# Patient Record
Sex: Male | Born: 1950 | Race: Black or African American | Hispanic: No | Marital: Single | Smoking: Former smoker
Health system: Southern US, Community
[De-identification: ages and names within clinical notes are randomized; demographics above are authoritative.]

## PROBLEM LIST (undated history)

## (undated) DIAGNOSIS — Z5112 Encounter for antineoplastic immunotherapy: Secondary | ICD-10-CM

## (undated) DIAGNOSIS — C3490 Malignant neoplasm of unspecified part of unspecified bronchus or lung: Secondary | ICD-10-CM

## (undated) DIAGNOSIS — I10 Essential (primary) hypertension: Secondary | ICD-10-CM

## (undated) DIAGNOSIS — F32A Depression, unspecified: Secondary | ICD-10-CM

## (undated) DIAGNOSIS — R569 Unspecified convulsions: Secondary | ICD-10-CM

## (undated) DIAGNOSIS — F79 Unspecified intellectual disabilities: Secondary | ICD-10-CM

## (undated) DIAGNOSIS — F89 Unspecified disorder of psychological development: Secondary | ICD-10-CM

## (undated) DIAGNOSIS — K219 Gastro-esophageal reflux disease without esophagitis: Secondary | ICD-10-CM

## (undated) DIAGNOSIS — Z5111 Encounter for antineoplastic chemotherapy: Secondary | ICD-10-CM

## (undated) DIAGNOSIS — F209 Schizophrenia, unspecified: Secondary | ICD-10-CM

## (undated) DIAGNOSIS — F329 Major depressive disorder, single episode, unspecified: Secondary | ICD-10-CM

## (undated) DIAGNOSIS — J189 Pneumonia, unspecified organism: Secondary | ICD-10-CM

## (undated) DIAGNOSIS — Z923 Personal history of irradiation: Secondary | ICD-10-CM

## (undated) HISTORY — PX: EYE SURGERY: SHX253

## (undated) HISTORY — DX: Encounter for antineoplastic chemotherapy: Z51.11

## (undated) HISTORY — DX: Encounter for antineoplastic immunotherapy: Z51.12

---

## 1998-06-07 ENCOUNTER — Ambulatory Visit (HOSPITAL_BASED_OUTPATIENT_CLINIC_OR_DEPARTMENT_OTHER): Admission: RE | Admit: 1998-06-07 | Discharge: 1998-06-07 | Payer: Self-pay | Admitting: Oral Surgery

## 1998-08-03 ENCOUNTER — Encounter: Payer: Self-pay | Admitting: Urology

## 1998-08-03 ENCOUNTER — Ambulatory Visit (HOSPITAL_COMMUNITY): Admission: RE | Admit: 1998-08-03 | Discharge: 1998-08-03 | Payer: Self-pay | Admitting: Urology

## 1998-08-17 ENCOUNTER — Encounter: Payer: Self-pay | Admitting: Urology

## 1998-08-17 ENCOUNTER — Ambulatory Visit (HOSPITAL_COMMUNITY): Admission: RE | Admit: 1998-08-17 | Discharge: 1998-08-17 | Payer: Self-pay | Admitting: Urology

## 1998-10-06 ENCOUNTER — Emergency Department (HOSPITAL_COMMUNITY): Admission: EM | Admit: 1998-10-06 | Discharge: 1998-10-06 | Payer: Self-pay | Admitting: Emergency Medicine

## 1998-10-06 ENCOUNTER — Encounter: Payer: Self-pay | Admitting: Emergency Medicine

## 1999-07-21 ENCOUNTER — Encounter: Payer: Self-pay | Admitting: Internal Medicine

## 1999-07-21 ENCOUNTER — Ambulatory Visit (HOSPITAL_COMMUNITY): Admission: RE | Admit: 1999-07-21 | Discharge: 1999-07-21 | Payer: Self-pay | Admitting: Internal Medicine

## 1999-10-10 ENCOUNTER — Emergency Department (HOSPITAL_COMMUNITY): Admission: EM | Admit: 1999-10-10 | Discharge: 1999-10-10 | Payer: Self-pay | Admitting: Emergency Medicine

## 1999-10-11 ENCOUNTER — Encounter: Payer: Self-pay | Admitting: Emergency Medicine

## 2002-05-26 ENCOUNTER — Emergency Department (HOSPITAL_COMMUNITY): Admission: EM | Admit: 2002-05-26 | Discharge: 2002-05-26 | Payer: Self-pay

## 2004-06-14 ENCOUNTER — Ambulatory Visit (HOSPITAL_COMMUNITY): Admission: RE | Admit: 2004-06-14 | Discharge: 2004-06-14 | Payer: Self-pay | Admitting: Gastroenterology

## 2005-06-20 ENCOUNTER — Ambulatory Visit (HOSPITAL_COMMUNITY): Admission: RE | Admit: 2005-06-20 | Discharge: 2005-06-20 | Payer: Self-pay | Admitting: Gastroenterology

## 2011-02-04 ENCOUNTER — Emergency Department (HOSPITAL_COMMUNITY): Payer: Medicaid Other

## 2011-02-04 ENCOUNTER — Emergency Department (HOSPITAL_COMMUNITY)
Admission: EM | Admit: 2011-02-04 | Discharge: 2011-02-04 | Disposition: A | Discharge status: Home / Self Care | Payer: Medicaid Other | Attending: Emergency Medicine | Admitting: Emergency Medicine

## 2011-02-04 DIAGNOSIS — F209 Schizophrenia, unspecified: Secondary | ICD-10-CM | POA: Insufficient documentation

## 2011-02-04 DIAGNOSIS — F79 Unspecified intellectual disabilities: Secondary | ICD-10-CM | POA: Insufficient documentation

## 2011-02-04 DIAGNOSIS — M545 Low back pain, unspecified: Secondary | ICD-10-CM | POA: Insufficient documentation

## 2011-02-04 DIAGNOSIS — K219 Gastro-esophageal reflux disease without esophagitis: Secondary | ICD-10-CM | POA: Insufficient documentation

## 2011-02-04 LAB — COMPREHENSIVE METABOLIC PANEL
Albumin: 3.9 g/dL (ref 3.5–5.2)
Alkaline Phosphatase: 86 U/L (ref 39–117)
BUN: 10 mg/dL (ref 6–23)
Creatinine, Ser: 0.9 mg/dL (ref 0.4–1.5)
Glucose, Bld: 98 mg/dL (ref 70–99)
Total Bilirubin: 0.1 mg/dL — ABNORMAL LOW (ref 0.3–1.2)
Total Protein: 7.1 g/dL (ref 6.0–8.3)

## 2011-02-04 LAB — DIFFERENTIAL
Eosinophils Absolute: 0 10*3/uL (ref 0.0–0.7)
Eosinophils Relative: 0 % (ref 0–5)
Lymphocytes Relative: 19 % (ref 12–46)
Lymphs Abs: 2 10*3/uL (ref 0.7–4.0)
Monocytes Absolute: 0.7 10*3/uL (ref 0.1–1.0)
Monocytes Relative: 7 % (ref 3–12)

## 2011-02-04 LAB — URINALYSIS, ROUTINE W REFLEX MICROSCOPIC
Glucose, UA: NEGATIVE mg/dL
Ketones, ur: NEGATIVE mg/dL
Nitrite: NEGATIVE
Protein, ur: NEGATIVE mg/dL

## 2011-02-04 LAB — CBC
HCT: 42.2 % (ref 39.0–52.0)
MCH: 30.3 pg (ref 26.0–34.0)
MCV: 88.8 fL (ref 78.0–100.0)
Platelets: 161 10*3/uL (ref 150–400)
RDW: 13.8 % (ref 11.5–15.5)

## 2011-02-04 LAB — LIPASE, BLOOD: Lipase: 11 U/L (ref 11–59)

## 2011-02-05 LAB — URINE CULTURE: Colony Count: NO GROWTH

## 2011-07-10 ENCOUNTER — Encounter (HOSPITAL_COMMUNITY): Payer: Self-pay | Admitting: Pharmacy Technician

## 2011-07-10 ENCOUNTER — Encounter (HOSPITAL_COMMUNITY)
Admission: RE | Admit: 2011-07-10 | Discharge: 2011-07-10 | Disposition: A | Discharge status: Home / Self Care | Payer: Medicaid Other | Source: Ambulatory Visit | Attending: Ophthalmology | Admitting: Ophthalmology

## 2011-07-10 ENCOUNTER — Encounter (HOSPITAL_COMMUNITY): Payer: Self-pay

## 2011-07-10 HISTORY — DX: Depression, unspecified: F32.A

## 2011-07-10 HISTORY — DX: Gastro-esophageal reflux disease without esophagitis: K21.9

## 2011-07-10 HISTORY — DX: Major depressive disorder, single episode, unspecified: F32.9

## 2011-07-10 HISTORY — DX: Essential (primary) hypertension: I10

## 2011-07-10 LAB — CBC
HCT: 43.5 % (ref 39.0–52.0)
Hemoglobin: 14.8 g/dL (ref 13.0–17.0)
MCH: 30.7 pg (ref 26.0–34.0)
MCV: 90.2 fL (ref 78.0–100.0)
RBC: 4.82 MIL/uL (ref 4.22–5.81)
WBC: 9.5 10*3/uL (ref 4.0–10.5)

## 2011-07-10 NOTE — Pre-Procedure Instructions (Signed)
20 Marco [name]  07/10/2011   Your procedure is scheduled on: Wednesday, November 7  Report to [facility] at 1030 AM.   Call this number if you have problems the morning of surgery: [phone]   Remember:   Do not eat food:After Midnight.  Do not drink clear liquids: 4 Hours before arrival.  Take these medicines the morning of surgery with A SIP OF WATER:none    Do not wear jewelry, make-up or nail polish.  Do not wear lotions, powders, or perfumes. You may wear deodorant.  Do not shave 48 hours prior to surgery.  Do not bring valuables to the hospital.  Contacts, dentures or bridgework may not be worn into surgery.  Leave suitcase in the car. After surgery it may be brought to your room.  For patients admitted to the hospital, checkout time is 11:00 AM the day of discharge.   Patients discharged the day of surgery will not be allowed to drive home.  Name and phone number of your driver:Marco [name]  Special Instructions: CHG Shower Use Special Wash: 1/2 bottle night before surgery and 1/2 bottle morning of surgery.   Please read over the following fact sheets that you were given: Pain Booklet, Coughing and Deep Breathing and Surgical Site Infection Prevention

## 2011-07-11 MED ORDER — PREDNISOLONE ACETATE 1 % OP SUSP
1.0000 [drp] | Freq: Once | OPHTHALMIC | Status: AC
  Administered 2011-07-12 (×2): 1 [drp] via OPHTHALMIC
  Filled 2011-07-11: qty 5

## 2011-07-11 MED ORDER — TETRACAINE HCL 0.5 % OP SOLN
2.0000 [drp] | OPHTHALMIC | Status: AC
  Administered 2011-07-12: 2 [drp] via OPHTHALMIC

## 2011-07-11 MED ORDER — SCOPOLAMINE HBR 0.25 % OP SOLN: 1.0000 [drp] | OPHTHALMIC | Status: AC

## 2011-07-11 MED ORDER — PHENYLEPHRINE HCL 2.5 % OP SOLN
1.0000 [drp] | OPHTHALMIC | Status: AC
  Administered 2011-07-12 (×3): 1 [drp] via OPHTHALMIC

## 2011-07-11 MED ORDER — GATIFLOXACIN 0.5 % OP SOLN
1.0000 [drp] | OPHTHALMIC | Status: DC
  Administered 2011-07-12 (×3): 1 [drp] via OPHTHALMIC
  Filled 2011-07-11: qty 2.5

## 2011-07-11 NOTE — H&P (Signed)
  Pre-operative History and Physical for Ophthalmic Surgery  Marco [name] 06/26/2011   Chief Complaint:decreased vision Diagnosis: .Mature Nuclear Sclerotic Cataract   No Known Allergies  [ X ] None    Planned procedure: Phacoemulsification and PC Intraocular Lens  There were no vitals filed for this visit.  Pulse: 70        Temp: NE        Resp:  18       ROS: non-contributory  Past Medical History  Diagnosis Date  . Mental disorder     sczizophrenia;moderate retardation  . Hypertension     no medications  . GERD (gastroesophageal reflux disease)   . Depression   . DEMENTIA     No past surgical history on file.    History   Social History  . Marital Status: Single    Spouse Name: N/A    Number of Children: N/A  . Years of Education: N/A   Occupational History  . Not on file.   Social History Main Topics  . Smoking status: Current Everyday Smoker -- 0.5 packs/day for 35 years    Types: Cigarettes  . Smokeless tobacco: Not on file  . Alcohol Use: No  . Drug Use: No  . Sexually Active: No   Other Topics Concern  . Not on file   Social History Narrative  . No narrative on file    The following examination is for anesthesia clearance for minimally invasive Ophthalmic surgery. It is primarily to document heart and lung findings and is not intended to elucidate unknown general medical conditions inclusive of abdominal masses, lung lesions, etc.   General Constitution:  good Alertness/Orientation:  Person, time place      [  ] yes     [  ] no  HEENT: [  ]  Eye Findings:  Nuclear Sclerotic Cataract                 left eye  Neck: supple without masses   Chest/Lungs: clear to auscultation   Cardiac: Normal S1 and S2 without Murmur, S3 or S4   Neuro: nf  Other pertinent findings: none  Impression: Nuclear Sclerotic Cataract Left Eye   Planned Procedure:  Phacoemulsification and PC Intraocular Lens     Marco [name]

## 2011-07-12 ENCOUNTER — Encounter (HOSPITAL_COMMUNITY): Payer: Self-pay | Admitting: Anesthesiology

## 2011-07-12 ENCOUNTER — Encounter (HOSPITAL_COMMUNITY): Payer: Self-pay | Admitting: *Deleted

## 2011-07-12 ENCOUNTER — Ambulatory Visit (HOSPITAL_COMMUNITY)
Admission: RE | Admit: 2011-07-12 | Discharge: 2011-07-12 | Disposition: A | Discharge status: Home / Self Care | Payer: Medicaid Other | Source: Ambulatory Visit | Attending: Ophthalmology | Admitting: Ophthalmology

## 2011-07-12 ENCOUNTER — Ambulatory Visit (HOSPITAL_COMMUNITY): Payer: Medicaid Other | Admitting: Anesthesiology

## 2011-07-12 ENCOUNTER — Encounter (HOSPITAL_COMMUNITY)
Admission: RE | Disposition: A | Discharge status: Home / Self Care | Payer: Self-pay | Source: Ambulatory Visit | Attending: Ophthalmology

## 2011-07-12 DIAGNOSIS — K219 Gastro-esophageal reflux disease without esophagitis: Secondary | ICD-10-CM | POA: Insufficient documentation

## 2011-07-12 DIAGNOSIS — F0390 Unspecified dementia without behavioral disturbance: Secondary | ICD-10-CM | POA: Insufficient documentation

## 2011-07-12 DIAGNOSIS — F329 Major depressive disorder, single episode, unspecified: Secondary | ICD-10-CM | POA: Insufficient documentation

## 2011-07-12 DIAGNOSIS — F71 Moderate intellectual disabilities: Secondary | ICD-10-CM | POA: Insufficient documentation

## 2011-07-12 DIAGNOSIS — F209 Schizophrenia, unspecified: Secondary | ICD-10-CM | POA: Insufficient documentation

## 2011-07-12 DIAGNOSIS — I10 Essential (primary) hypertension: Secondary | ICD-10-CM | POA: Insufficient documentation

## 2011-07-12 DIAGNOSIS — Z01812 Encounter for preprocedural laboratory examination: Secondary | ICD-10-CM | POA: Insufficient documentation

## 2011-07-12 DIAGNOSIS — H2510 Age-related nuclear cataract, unspecified eye: Secondary | ICD-10-CM | POA: Insufficient documentation

## 2011-07-12 DIAGNOSIS — F3289 Other specified depressive episodes: Secondary | ICD-10-CM | POA: Insufficient documentation

## 2011-07-12 HISTORY — PX: CATARACT EXTRACTION W/PHACO: SHX586

## 2011-07-12 SURGERY — PHACOEMULSIFICATION, CATARACT, WITH IOL INSERTION
Anesthesia: General | Site: Eye | Laterality: Left

## 2011-07-12 MED ORDER — ONDANSETRON HCL 4 MG/2ML IJ SOLN
INTRAMUSCULAR | Status: DC | PRN
  Administered 2011-07-12: 4 mg via INTRAVENOUS

## 2011-07-12 MED ORDER — HYDROXYPROPYL METHYLCELLULOSE 2.5 % OP SOLN
OPHTHALMIC | Status: DC | PRN
  Administered 2011-07-12: 1 [drp] via OPHTHALMIC

## 2011-07-12 MED ORDER — CEFAZOLIN SUBCONJUNCTIVAL INJECTION 100 MG/0.5 ML
100.0000 mg | INJECTION | SUBCONJUNCTIVAL | Status: AC
  Administered 2011-07-12: 100 mg via SUBCONJUNCTIVAL
  Filled 2011-07-12: qty 0.5

## 2011-07-12 MED ORDER — TRYPAN BLUE 0.15 % OP SOLN
Freq: Once | OPHTHALMIC | Status: AC
  Administered 2011-07-12: 0.5 mL via INTRAOCULAR
  Filled 2011-07-12: qty 0.5

## 2011-07-12 MED ORDER — PROVISC 10 MG/ML IO SOLN
INTRAOCULAR | Status: DC | PRN
  Administered 2011-07-12: .85 mL via INTRAOCULAR

## 2011-07-12 MED ORDER — NA CHONDROIT SULF-NA HYALURON 40-30 MG/ML IO SOLN
INTRAOCULAR | Status: DC | PRN
  Administered 2011-07-12: 0.5 mL via INTRAOCULAR

## 2011-07-12 MED ORDER — DEXAMETHASONE SODIUM PHOSPHATE 10 MG/ML IJ SOLN
INTRAMUSCULAR | Status: DC | PRN
  Administered 2011-07-12: 10 mg

## 2011-07-12 MED ORDER — SODIUM CHLORIDE 0.9 % IV SOLN
INTRAVENOUS | Status: DC | PRN
  Administered 2011-07-12 (×2): via INTRAVENOUS

## 2011-07-12 MED ORDER — TETRACAINE HCL 0.5 % OP SOLN
OPHTHALMIC | Status: AC
  Filled 2011-07-12: qty 2

## 2011-07-12 MED ORDER — PHENYLEPHRINE HCL 2.5 % OP SOLN
OPHTHALMIC | Status: AC
  Filled 2011-07-12: qty 3

## 2011-07-12 MED ORDER — EPINEPHRINE HCL 1 MG/ML IJ SOLN
INTRAMUSCULAR | Status: DC | PRN
  Administered 2011-07-12: .3 mL

## 2011-07-12 MED ORDER — PROPOFOL 10 MG/ML IV EMUL
INTRAVENOUS | Status: DC | PRN
  Administered 2011-07-12: 180 mg via INTRAVENOUS
  Administered 2011-07-12: 50 mg via INTRAVENOUS

## 2011-07-12 MED ORDER — HOMATROPINE HBR 2 % OP SOLN
1.0000 [drp] | Freq: Once | OPHTHALMIC | Status: DC
  Filled 2011-07-12: qty 5

## 2011-07-12 MED ORDER — BACITRACIN-POLYMYXIN B 500-10000 UNIT/GM OP OINT
TOPICAL_OINTMENT | OPHTHALMIC | Status: DC | PRN
  Administered 2011-07-12: 1 via OPHTHALMIC

## 2011-07-12 MED ORDER — FENTANYL CITRATE 0.05 MG/ML IJ SOLN
INTRAMUSCULAR | Status: DC | PRN
  Administered 2011-07-12 (×2): 50 ug via INTRAVENOUS

## 2011-07-12 MED ORDER — MIDAZOLAM HCL 5 MG/5ML IJ SOLN
INTRAMUSCULAR | Status: DC | PRN
  Administered 2011-07-12: 1 mg via INTRAVENOUS

## 2011-07-12 MED ORDER — SODIUM CHLORIDE 0.9 % IV SOLN
INTRAVENOUS | Status: DC
  Administered 2011-07-12: 14:00:00 via INTRAVENOUS

## 2011-07-12 MED ORDER — BALANCED SALT IO SOLN
INTRAOCULAR | Status: DC | PRN
  Administered 2011-07-12: 15 mL via INTRAOCULAR

## 2011-07-12 MED ORDER — EPINEPHRINE HCL 1 MG/ML IJ SOLN
INTRAOCULAR | Status: DC | PRN
  Administered 2011-07-12: 15:00:00

## 2011-07-12 MED ORDER — BUPIVACAINE HCL 0.75 % IJ SOLN
INTRAMUSCULAR | Status: DC | PRN
  Administered 2011-07-12: 10 mL

## 2011-07-12 SURGICAL SUPPLY — 56 items
APL SRG 3 HI ABS STRL LF PLS (MISCELLANEOUS) ×1
APPLICATOR COTTON TIP 6IN STRL (MISCELLANEOUS) ×2 IMPLANT
APPLICATOR DR MATTHEWS STRL (MISCELLANEOUS) ×2 IMPLANT
BLADE EYE MINI 60D BEAVER (BLADE) IMPLANT
BLADE KERATOME 2.75 (BLADE) ×2 IMPLANT
BLADE STAB KNIFE 15DEG (BLADE) IMPLANT
CANNULA ANTERIOR CHAMBER 27GA (MISCELLANEOUS) IMPLANT
CLOTH BEACON ORANGE TIMEOUT ST (SAFETY) ×2 IMPLANT
DRAPE OPHTHALMIC 77X100 STRL (CUSTOM PROCEDURE TRAY) ×2 IMPLANT
DRAPE POUCH INSTRU U-SHP 10X18 (DRAPES) ×2 IMPLANT
DRSG TEGADERM 4X4.75 (GAUZE/BANDAGES/DRESSINGS) ×2 IMPLANT
EYE SHIELD UNIVERSAL CLEAR (GAUZE/BANDAGES/DRESSINGS) ×1 IMPLANT
FILTER BLUE MILLIPORE (MISCELLANEOUS) IMPLANT
GLOVE BIOGEL M 6.5 STRL (GLOVE) ×4 IMPLANT
GLOVE SS BIOGEL STRL SZ 6.5 (GLOVE) ×1 IMPLANT
GLOVE SUPERSENSE BIOGEL SZ 6.5 (GLOVE) ×1
GOWN PREVENTION PLUS XLARGE (GOWN DISPOSABLE) ×2 IMPLANT
GOWN STRL NON-REIN LRG LVL3 (GOWN DISPOSABLE) ×2 IMPLANT
KIT ROOM TURNOVER OR (KITS) IMPLANT
KNIFE GRIESHABER SHARP 2.5MM (MISCELLANEOUS) ×2 IMPLANT
LENS IOL ACRYSOF MP POST 21.0 (Intraocular Lens) ×1 IMPLANT
MARKER SKIN DUAL TIP RULER LAB (MISCELLANEOUS) ×2 IMPLANT
NDL 18GX1X1/2 (RX/OR ONLY) (NEEDLE) IMPLANT
NDL 25GX 5/8IN NON SAFETY (NEEDLE) ×1 IMPLANT
NDL HYPO 30X.5 LL (NEEDLE) ×2 IMPLANT
NEEDLE 18GX1X1/2 (RX/OR ONLY) (NEEDLE) IMPLANT
NEEDLE 22X1 1/2 (OR ONLY) (NEEDLE) ×2 IMPLANT
NEEDLE 25GX 5/8IN NON SAFETY (NEEDLE) ×2 IMPLANT
NEEDLE HYPO 30X.5 LL (NEEDLE) ×4 IMPLANT
NS IRRIG 1000ML POUR BTL (IV SOLUTION) ×2 IMPLANT
PACK CATARACT CUSTOM (CUSTOM PROCEDURE TRAY) ×2 IMPLANT
PACK CATARACT MCHSCP (PACKS) ×2 IMPLANT
PACK COMBINED CATERACT/VIT 23G (OPHTHALMIC RELATED) IMPLANT
PAD ARMBOARD 7.5X6 YLW CONV (MISCELLANEOUS) ×2 IMPLANT
PAD EYE OVAL STERILE LF (GAUZE/BANDAGES/DRESSINGS) ×2 IMPLANT
PHACO TIP KELMAN 45DEG (TIP) ×2 IMPLANT
PROBE ANTERIOR 20G W/INFUS NDL (MISCELLANEOUS) IMPLANT
ROLLS DENTAL (MISCELLANEOUS) ×4 IMPLANT
SHUTTLE MONARCH TYPE A (NEEDLE) ×2 IMPLANT
SOLUTION ANTI FOG 6CC (MISCELLANEOUS) ×2 IMPLANT
SPEAR EYE SURG WECK-CEL (MISCELLANEOUS) ×6 IMPLANT
SUT ETHILON 10 0 CS140 6 (SUTURE) ×1 IMPLANT
SUT ETHILON 5 0 P 3 18 (SUTURE)
SUT ETHILON 9 0 TG140 8 (SUTURE) IMPLANT
SUT NYLON ETHILON 5-0 P-3 1X18 (SUTURE) IMPLANT
SUT PLAIN 6 0 TG1408 (SUTURE) IMPLANT
SUT POLY NON ABSORB 10-0 8 STR (SUTURE) IMPLANT
SUT VICRYL 6 0 S 29 12 (SUTURE) IMPLANT
SYR 20CC LL (SYRINGE) IMPLANT
SYR 50ML LL SCALE MARK (SYRINGE) IMPLANT
SYR 5ML LL (SYRINGE) IMPLANT
SYR TB 1ML LUER SLIP (SYRINGE) IMPLANT
SYRINGE 10CC LL (SYRINGE) IMPLANT
TAPE CLOTH 2X10 TAN LF (GAUZE/BANDAGES/DRESSINGS) ×1 IMPLANT
TOWEL OR 17X24 6PK STRL BLUE (TOWEL DISPOSABLE) ×4 IMPLANT
WATER STERILE IRR 1000ML POUR (IV SOLUTION) ×2 IMPLANT

## 2011-07-12 NOTE — Brief Op Note (Signed)
07/12/2011  3:42 PM  PATIENT:  Marco [name]  60 y.o. male  PRE-OPERATIVE DIAGNOSIS:  Cataract Left Eye  POST-OPERATIVE DIAGNOSIS:  Cataract Left Eye  PROCEDURE:  Procedure(s): CATARACT EXTRACTION PHACO AND INTRAOCULAR LENS PLACEMENT (IOC)  SURGEON:  Surgeon(s): Shade Flood   ASSISTANTS: none   ANESTHESIA:   general  EBL:  Total I/O In: 500 [I.V.:500] Out: -   BLOOD ADMINISTERED:none  DRAINS: none   LOCAL MEDICATIONS USED:  BUPIVICAINE 0.75% 4CC  SPECIMEN:  No Specimen  DISPOSITION OF SPECIMEN:  N/A  COUNTS:  YES  TOURNIQUET:  * No tourniquets in log *  DICTATION: .Note written in EPIC  PLAN OF CARE: Discharge to home after PACU  PATIENT DISPOSITION:  PACU - hemodynamically stable.   Delay start of Pharmacological VTE agent (>24hrs) due to surgical blood loss or risk of bleeding:  not applicable

## 2011-07-12 NOTE — Transfer of Care (Signed)
Immediate Anesthesia Transfer of Care Note  Patient: Marco [name]  Procedure(s) Performed:  CATARACT EXTRACTION PHACO AND INTRAOCULAR LENS PLACEMENT (IOC)  Patient Location: PACU  Anesthesia Type: General  Level of Consciousness: awake, alert , patient cooperative and responds to stimulation  Airway & Oxygen Therapy: Patient Spontanous Breathing and Patient connected to nasal cannula oxygen  Post-op Assessment: Report given to PACU RN  Post vital signs: Reviewed and stable Complications: No apparent anesthesia complications

## 2011-07-12 NOTE — Anesthesia Procedure Notes (Signed)
Procedure Name: LMA Insertion Date/Time: 07/12/2011 2:56 PM Performed by: Darcey Nora Pre-anesthesia Checklist: Patient identified, Emergency Drugs available, Suction available and Patient being monitored Patient Re-evaluated:Patient Re-evaluated prior to inductionOxygen Delivery Method: Circle System Utilized Preoxygenation: Pre-oxygenation with 100% oxygen Intubation Type: IV induction Ventilation: Mask ventilation without difficulty LMA: LMA inserted LMA Size: 4.0 Number of attempts: 1 Placement Confirmation: positive ETCO2 and breath sounds checked- equal and bilateral Tube secured with: Tape

## 2011-07-12 NOTE — Op Note (Signed)
Marco [name] 07/12/2011 @DIAG @  Procedure: Phacoemulsification, Posterior Chamber Intra-ocular Lens Operative Eye:  left eye  Surgeon: Shade Flood Estimated Blood Loss: minimal Specimens for Pathology:  None Complications: none  The patient was prepared and draped in the usual manner for ocular surgery on the left eye. A Cook lid speculum was placed. A peripheral clear corneal incision was made at the surgical limbus centered at the 11:00 meridian. A separate clear corneal stab incision was made with a 15 degree blade at the 2:00 meridian to permit bi-manual technique. Provisc was instilled into the anterior chamber through that incision.  A keratome was used to create a self sealing incision entering the anterior chamber at the 11:00 meridian.  Membrane Blue in 50%  Concentration was used to stain the anterior capsule of this Hydropic Cataract. Provisc was instilled into the anterior chamber. A capsulorhexis was performed using a bent 25g needle. The lens was hydrodissected and the nucleus was hydrodilineated using a Nichammin cannula. The Chang chopper was inserted and used to rotate the lens to insure adequate lens mobility. The phacoemulsification handpiece was inserted and a combined phaco-chop technique was employed, fracturing the lens into separate sections with subsequent removal with the phaco handpiece.   The I/A cannula was used to remove remaining lens cortex. Provisc was instilled and used to deepen the anterior chamber and posterior capsule bag. The Monarch injector was used to place a folded Acrysof [id number] PC IOL, + 21 diopters, into the capsule bag. A Sinskey lens hook was used to dial in the trailing haptic.  The I/A cannula was used to remove the viscoelastic from the anterior chamber. BSS was used to bring IOP to the desired range and the wound was checked to insure it was watertight. Subconjunctival injections of Ance 100/0.67ml and Dexamethasone 4mg /38ml were placed without  complication. The lid speculum and drapes were removed and the patient's eye was patched with Polymixin/Bacitracin ophthalmic ointment. An eye shield was placed and the patient was transferred alert and conversant from the operating room to the post-operative recovery area.   Dayvian Blixt, Waynette Buttery

## 2011-07-12 NOTE — Brief Op Note (Signed)
07/12/2011  3:45 PM  PATIENT:  Marco [name]  60 y.o. male  PRE-OPERATIVE DIAGNOSIS:  Cataract Left Eye  POST-OPERATIVE DIAGNOSIS:  Cataract Left Eye  PROCEDURE:  Procedure(s): CATARACT EXTRACTION PHACO AND INTRAOCULAR LENS PLACEMENT (IOC)  SURGEON:  Surgeon(s): Shade Flood  PHYSICIAN ASSISTANT:   ASSISTANTS: none   ANESTHESIA:   general  EBL:  Total I/O In: 500 [I.V.:500] Out: -   BLOOD ADMINISTERED:none  DRAINS: none   LOCAL MEDICATIONS USED:  MARCAINE 0.75% 4CC,  SPECIMEN:  No Specimen  DISPOSITION OF SPECIMEN:  N/A  COUNTS:  YES  TOURNIQUET:  * No tourniquets in log *  DICTATION: .Note written in EPIC  PLAN OF CARE: Discharge to home after PACU  PATIENT DISPOSITION:  PACU - hemodynamically stable.   Delay start of Pharmacological VTE agent (>24hrs) due to surgical blood loss or risk of bleeding:  not applicable              GREGORIO WORLEY 07/12/2011 @DIAG @  Procedure: Phacoemulsification, Posterior Chamber Intra-ocular Lens Operative Eye:  left eye  Surgeon: Shade Flood Estimated Blood Loss: minimal Specimens for Pathology:  None Complications: none  The patient was prepared and draped in the usual manner for ocular surgery on the left eye. A Cook lid speculum was placed. A peripheral clear corneal incision was made at the surgical limbus centered at the 11:00 meridian. A separate clear corneal stab incision was made with a 15 degree blade at the 2:00 meridian to permit bi-manual technique. Provisc was instilled into the anterior chamber through that incision.  A keratome was used to create a self sealing incision entering the anterior chamber at the 11:00 meridian. Memmbrane blue diluted 50% was injected into the anterior chamber to stain the anterior capsule to facilitate making a capsulorhexis in this Hydropic Cataract. Provisc was then instilled after rinsing out the Membrane Blue. A capsulorhexis was performed using a bent 25g  needle. The lens was hydrodissected and the nucleus was hydrodilineated using a Nichammin cannula. The Chang chopper was inserted and used to rotate the lens to insure adequate lens mobility. The phacoemulsification handpiece was inserted and a combined phaco-chop technique was employed, fracturing the lens into separate sections with subsequent removal with the phaco handpiece.   The I/A cannula was used to remove remaining lens cortex. Provisc was instilled and used to deepen the anterior chamber and posterior capsule bag. The Monarch injector was used to place a folded Acrysof MA50BM PC IOL, + 21 diopters, into the capsule bag. A Sinskey lens hook was used to dial in the trailing haptic.  The I/A cannula was used to remove the viscoelastic from the anterior chamber. BSS was used to bring IOP to the desired range and the wound was checked to insure it was watertight. Subconjunctival injections of Ance 100/0.57ml and Dexamethasone 4mg /36ml were placed without complication. The lid speculum and drapes were removed and the patient's eye was patched with Polymixin/Bacitracin ophthalmic ointment. An eye shield was placed and the patient was transferred alert and conversant from the operating room to the post-operative recovery area.   Mannat Benedetti, Waynette Buttery

## 2011-07-12 NOTE — Preoperative (Signed)
Beta Blockers   Reason not to administer Beta Blockers:Not Applicable 

## 2011-07-12 NOTE — Interval H&P Note (Signed)
History and Physical Interval Note:   07/12/2011   2:12 PM   Marco [name]  has presented today for surgery, with the diagnosis of cataract  The various methods of treatment have been discussed with the patient and family. After consideration of risks, benefits and other options for treatment, the patient has consented to  Procedure(s): CATARACT EXTRACTION PHACO AND INTRAOCULAR LENS PLACEMENT (IOC) as a surgical intervention .  The patients' history has been reviewed, patient examined, no change in status, stable for surgery.  I have reviewed the patients' chart and labs.  Questions were answered to the patient's satisfaction.     Shade Flood  MD       Interval History and Physical Examination: Marco [name] 07/12/2011  Date of Initial H&P: 06/26/2011 [  ] scanned, located in  [  ] images  [  ] notes  On  physical exam today,  the patient has no new findings. The patient has no new complaints. The indications for today's procedure remain valid. There are no medical contraindications for proceeding with today's planned surgery and we will proceed as planned.  Ramadan Couey, Waynette Buttery

## 2011-07-12 NOTE — Anesthesia Postprocedure Evaluation (Signed)
  Anesthesia Post-op Note  Patient: Marco [name]  Procedure(s) Performed:  CATARACT EXTRACTION PHACO AND INTRAOCULAR LENS PLACEMENT (IOC)  Patient Location: PACU  Anesthesia Type: General  Level of Consciousness: awake and alert   Airway and Oxygen Therapy: Patient Spontanous Breathing  Post-op Pain: mild  Post-op Assessment: Post-op Vital signs reviewed  Post-op Vital Signs: stable  Complications: No apparent anesthesia complications

## 2011-07-12 NOTE — Anesthesia Preprocedure Evaluation (Signed)
Anesthesia Evaluation  Patient identified by MRN, date of birth, ID band Patient confused    Airway Mallampati: II  Neck ROM: Full    Dental  (+) Edentulous Upper and Poor Dentition   Pulmonary  clear to auscultation        Cardiovascular Regular Normal    Neuro/Psych    GI/Hepatic   Endo/Other    Renal/GU      Musculoskeletal   Abdominal   Peds  Hematology   Anesthesia Other Findings   Reproductive/Obstetrics                           Anesthesia Physical Anesthesia Plan  ASA: III  Anesthesia Plan: General   Post-op Pain Management:    Induction: Intravenous  Airway Management Planned: LMA  Additional Equipment:   Intra-op Plan:   Post-operative Plan:   Informed Consent: I have reviewed the patients History and Physical, chart, labs and discussed the procedure including the risks, benefits and alternatives for the proposed anesthesia with the patient or authorized representative who has indicated his/her understanding and acceptance.   Dental advisory given  Plan Discussed with: CRNA and Surgeon  Anesthesia Plan Comments:         Anesthesia Quick Evaluation

## 2011-07-14 MED FILL — Hypromellose Gonioscopic Soln 2.5%: OPHTHALMIC | Qty: 15 | Status: AC

## 2011-07-19 ENCOUNTER — Encounter (HOSPITAL_COMMUNITY): Payer: Self-pay | Admitting: Ophthalmology

## 2012-09-20 ENCOUNTER — Emergency Department (HOSPITAL_COMMUNITY)
Admission: EM | Admit: 2012-09-20 | Discharge: 2012-09-20 | Disposition: A | Discharge status: Home / Self Care | Payer: Medicaid Other | Source: Home / Self Care

## 2012-09-20 ENCOUNTER — Encounter (HOSPITAL_COMMUNITY): Payer: Self-pay | Admitting: *Deleted

## 2012-09-20 DIAGNOSIS — J069 Acute upper respiratory infection, unspecified: Secondary | ICD-10-CM

## 2012-09-20 MED ORDER — PHENYLEPHRINE-BROMPHEN-DM 2.5-1-5 MG/5ML PO LIQD: 5.0000 mL | Freq: Four times a day (QID) | ORAL | Status: DC | PRN

## 2012-09-20 NOTE — ED Provider Notes (Signed)
History     CSN: [id number]  Arrival date & time 09/20/12  1012   First MD Initiated Contact with Patient 09/20/12 1031      Chief Complaint  Patient presents with  . URI    (Consider location/radiation/quality/duration/timing/severity/associated sxs/prior treatment) HPI Comments: 62 year old male presents with 2 days history of nasal congestion and runny nose. He denies fever cough pain anywhere, earache, shortness of breath GI or GU symptoms.   Past Medical History  Diagnosis Date  . Mental disorder     sczizophrenia;moderate retardation  . GERD (gastroesophageal reflux disease)   . Depression   . DEMENTIA   . Hypertension     no medications, no documented history per caregiver at preadmission    Past Surgical History  Procedure Date  . Cataract extraction w/phaco 07/12/2011    Procedure: CATARACT EXTRACTION PHACO AND INTRAOCULAR LENS PLACEMENT (IOC);  Surgeon: Shade Flood;  Location: [facility] OR;  Service: Ophthalmology;  Laterality: Left;    Family History  Problem Relation Age of Onset  . Family history unknown: Yes    History  Substance Use Topics  . Smoking status: Current Every Day Smoker -- 0.5 packs/day for 35 years    Types: Cigarettes  . Smokeless tobacco: Not on file  . Alcohol Use: No      Review of Systems  Constitutional: Negative for fever, chills, diaphoresis, activity change and fatigue.  HENT: Positive for congestion, sore throat, rhinorrhea, trouble swallowing and postnasal drip. Negative for ear pain, facial swelling, neck pain and neck stiffness.   Eyes: Negative for pain, discharge and redness.  Respiratory: Negative for cough, chest tightness and shortness of breath.   Cardiovascular: Negative.   Gastrointestinal: Negative.   Musculoskeletal: Negative.   Neurological: Negative.     Allergies  Review of patient's allergies indicates no known allergies.  Home Medications   Current Outpatient Rx  Name  Route  Sig  Dispense  Refill  .  BISACODYL 5 MG PO TBEC   Oral   Take 10 mg by mouth daily as needed. For constipation          . BUPROPION HCL ER (XL) 300 MG PO TB24   Oral   Take 300 mg by mouth every morning.           Marland Kitchen CETIRIZINE HCL 10 MG PO TABS   Oral   Take 10 mg by mouth daily.           Marland Kitchen DOCUSATE SODIUM 100 MG PO CAPS   Oral   Take 100 mg by mouth 2 (two) times daily.           . DONEPEZIL HCL 10 MG PO TABS   Oral   Take 10 mg by mouth at bedtime.           Marland Kitchen LORAZEPAM 1 MG PO TABS   Oral   Take 1-2 mg by mouth at bedtime as needed. For sleep          . OMEPRAZOLE 20 MG PO CPDR   Oral   Take 40 mg by mouth daily.           Marland Kitchen OXCARBAZEPINE 300 MG PO TABS   Oral   Take 600 mg by mouth 2 (two) times daily.           Marland Kitchen PHENYLEPHRINE-BROMPHEN-DM 2.5-1-5 MG/5ML PO LIQD   Oral   Take 5 mLs by mouth 4 (four) times daily as needed.   120 mL   0   .  VITAMIN D (ERGOCALCIFEROL) 50000 UNITS PO CAPS   Oral   Take 50,000 Units by mouth every 7 (seven) days.             BP 158/98  Pulse 82  Temp 98.8 F (37.1 C) (Oral)  Resp 18  SpO2 99%  Physical Exam  Nursing note and vitals reviewed. Constitutional: He is oriented to person, place, and time. He appears well-developed and well-nourished. No distress.  HENT:       Bilateral TMs are normal although the right TM is partially cured by cerumen Oropharynx is clear moist with PND the  Neck: Normal range of motion. Neck supple.  Cardiovascular: Normal rate, regular rhythm and normal heart sounds.   Pulmonary/Chest: Effort normal and breath sounds normal. No respiratory distress.  Abdominal: Soft. There is no tenderness.  Musculoskeletal: Normal range of motion. He exhibits no edema.  Lymphadenopathy:    He has no cervical adenopathy.  Neurological: He is alert and oriented to person, place, and time.  Skin: Skin is warm and dry. No rash noted.  Psychiatric: He has a normal mood and affect.    ED Course  Procedures  (including critical care time)  Labs Reviewed - No data to display No results found.   1. URI, acute       MDM  Dimetapp liquid 1 teaspoon 4 times a day when necessary stuffy nose and drainage Drink plenty of fluids stay well hydrated Tylenol every 4 hours for any onset of fever or discomfort. Recheck promptly for any new symptoms problems or worsening        Hayden Rasmussen, NP 09/20/12 1136

## 2012-09-20 NOTE — ED Notes (Signed)
Caregiver reports sudden onset of body aches, throat pain and sinus congestion that started wednesday

## 2012-09-23 NOTE — ED Provider Notes (Signed)
Medical screening examination/treatment/procedure(s) were performed by resident physician or non-physician practitioner and as supervising physician I was immediately available for consultation/collaboration.   Omari Mcmanaway,Leanard DOUGLAS MD.    Maxwell D Alyn Riedinger, MD 09/23/12 1840 

## 2012-10-29 DIAGNOSIS — Z550 Illiteracy and low-level literacy: Secondary | ICD-10-CM | POA: Insufficient documentation

## 2012-10-29 DIAGNOSIS — F72 Severe intellectual disabilities: Secondary | ICD-10-CM | POA: Insufficient documentation

## 2013-04-07 ENCOUNTER — Emergency Department (HOSPITAL_COMMUNITY)
Admission: EM | Admit: 2013-04-07 | Discharge: 2013-04-07 | Disposition: A | Discharge status: Home / Self Care | Payer: Medicaid Other | Attending: Emergency Medicine | Admitting: Emergency Medicine

## 2013-04-07 ENCOUNTER — Encounter (HOSPITAL_COMMUNITY): Payer: Self-pay | Admitting: *Deleted

## 2013-04-07 ENCOUNTER — Emergency Department (HOSPITAL_COMMUNITY): Payer: Medicaid Other

## 2013-04-07 DIAGNOSIS — R569 Unspecified convulsions: Secondary | ICD-10-CM

## 2013-04-07 DIAGNOSIS — K219 Gastro-esophageal reflux disease without esophagitis: Secondary | ICD-10-CM | POA: Insufficient documentation

## 2013-04-07 DIAGNOSIS — I10 Essential (primary) hypertension: Secondary | ICD-10-CM | POA: Insufficient documentation

## 2013-04-07 DIAGNOSIS — F17200 Nicotine dependence, unspecified, uncomplicated: Secondary | ICD-10-CM | POA: Insufficient documentation

## 2013-04-07 DIAGNOSIS — F3289 Other specified depressive episodes: Secondary | ICD-10-CM | POA: Insufficient documentation

## 2013-04-07 DIAGNOSIS — F329 Major depressive disorder, single episode, unspecified: Secondary | ICD-10-CM | POA: Insufficient documentation

## 2013-04-07 DIAGNOSIS — G40909 Epilepsy, unspecified, not intractable, without status epilepticus: Secondary | ICD-10-CM | POA: Insufficient documentation

## 2013-04-07 DIAGNOSIS — F0390 Unspecified dementia without behavioral disturbance: Secondary | ICD-10-CM | POA: Insufficient documentation

## 2013-04-07 DIAGNOSIS — Z79899 Other long term (current) drug therapy: Secondary | ICD-10-CM | POA: Insufficient documentation

## 2013-04-07 HISTORY — DX: Unspecified convulsions: R56.9

## 2013-04-07 LAB — CBC WITH DIFFERENTIAL/PLATELET
Basophils Absolute: 0.1 10*3/uL (ref 0.0–0.1)
Basophils Relative: 1 % (ref 0–1)
HCT: 43.9 % (ref 39.0–52.0)
Lymphocytes Relative: 24 % (ref 12–46)
Neutro Abs: 5.8 10*3/uL (ref 1.7–7.7)
Neutrophils Relative %: 68 % (ref 43–77)
Platelets: 174 10*3/uL (ref 150–400)
RDW: 13.7 % (ref 11.5–15.5)
WBC: 8.5 10*3/uL (ref 4.0–10.5)

## 2013-04-07 LAB — URINE MICROSCOPIC-ADD ON

## 2013-04-07 LAB — HEPATIC FUNCTION PANEL
ALT: 11 U/L (ref 0–53)
Albumin: 4.5 g/dL (ref 3.5–5.2)
Alkaline Phosphatase: 72 U/L (ref 39–117)
Total Protein: 7.3 g/dL (ref 6.0–8.3)

## 2013-04-07 LAB — BASIC METABOLIC PANEL
CO2: 25 mEq/L (ref 19–32)
Chloride: 104 mEq/L (ref 96–112)
GFR calc Af Amer: 84 mL/min — ABNORMAL LOW (ref >90)
Potassium: 3.4 mEq/L — ABNORMAL LOW (ref 3.5–5.1)
Sodium: 144 mEq/L (ref 135–145)

## 2013-04-07 LAB — URINALYSIS, ROUTINE W REFLEX MICROSCOPIC
Glucose, UA: NEGATIVE mg/dL
Ketones, ur: NEGATIVE mg/dL
Protein, ur: 100 mg/dL — AB
pH: 5 (ref 5.0–8.0)

## 2013-04-07 MED ORDER — LEVETIRACETAM 500 MG PO TABS: 500.0000 mg | ORAL_TABLET | Freq: Two times a day (BID) | ORAL | Status: DC

## 2013-04-07 MED ORDER — SODIUM CHLORIDE 0.9 % IV SOLN
1000.0000 mg | Freq: Once | INTRAVENOUS | Status: AC
  Administered 2013-04-07: 1000 mg via INTRAVENOUS
  Filled 2013-04-07: qty 10

## 2013-04-07 NOTE — ED Notes (Signed)
Pt from a group home and has mental retardation. Approximately and hour prior to arrival, pt stood up, screamed loudly, fell into a screen door and had a witnessed gran mal seizure that lasted a minute in duration. Pt was post ictal upon EMS arrival and had urinated on himself. Pt denies neck or back pain at this time. Reports pain in top left forehead. No medication was administered by the group home or EMS. Pt alert and oriented at this time, requesting to have a cigarette and go back to the group home.

## 2013-04-07 NOTE — ED Provider Notes (Signed)
CSN: [id number]     Arrival date & time 04/07/13  1737 History     First MD Initiated Contact with Patient 04/07/13 1739     Chief Complaint  Patient presents with  . Seizures   (Consider location/radiation/quality/duration/timing/severity/associated sxs/prior Treatment) HPI  Marco [name] is a 62 y.o. male  with past medical history significant for moderate MR, dementia, schizophrenia, seizure disorder currently residing in a group home presenting with grand mal seizure lasting approximately 1 minute. As per EMS report he was post ictal but he is at his baseline now. Patient is accompanied by a worker from the group home, she states that the patient is new to her facility but that there is no record of him having a history of seizure disorder. There was also loss of urinary continence. Level V caveat secondary to MR and dementia.  Past Medical History  Diagnosis Date  . Mental disorder     sczizophrenia;moderate retardation  . GERD (gastroesophageal reflux disease)   . Depression   . DEMENTIA   . Hypertension     no medications, no documented history per caregiver at preadmission  . Seizures    Past Surgical History  Procedure Laterality Date  . Cataract extraction w/phaco  07/12/2011    Procedure: CATARACT EXTRACTION PHACO AND INTRAOCULAR LENS PLACEMENT (IOC);  Surgeon: Shade Flood;  Location: [facility] OR;  Service: Ophthalmology;  Laterality: Left;   No family history on file. History  Substance Use Topics  . Smoking status: Current Every Day Smoker -- 0.50 packs/day for 35 years    Types: Cigarettes  . Smokeless tobacco: Not on file  . Alcohol Use: No    Review of Systems 10 systems reviewed and found to be negative, except as noted in the HPI   Allergies  Review of patient's allergies indicates no known allergies.  Home Medications   Current Outpatient Rx  Name  Route  Sig  Dispense  Refill  . bisacodyl (DULCOLAX) 5 MG EC tablet   Oral   Take 10 mg by mouth daily  as needed. For constipation          . buPROPion (WELLBUTRIN XL) 300 MG 24 hr tablet   Oral   Take 300 mg by mouth every morning.           . cetirizine (ZYRTEC) 10 MG tablet   Oral   Take 10 mg by mouth daily.           Marland Kitchen docusate sodium (COLACE) 100 MG capsule   Oral   Take 100 mg by mouth 2 (two) times daily.           Marland Kitchen donepezil (ARICEPT) 10 MG tablet   Oral   Take 10 mg by mouth at bedtime.           Marland Kitchen LORazepam (ATIVAN) 1 MG tablet   Oral   Take 1-2 mg by mouth at bedtime as needed. For sleep          . omeprazole (PRILOSEC) 20 MG capsule   Oral   Take 40 mg by mouth daily.           . Oxcarbazepine (TRILEPTAL) 300 MG tablet   Oral   Take 600 mg by mouth 2 (two) times daily.           Marland Kitchen Phenylephrine-Bromphen-DM (DIMETAPP DM COLD/COUGH) 2.5-1-5 MG/5ML LIQD   Oral   Take 5 mLs by mouth 4 (four) times daily as needed.   120 mL  0   . Vitamin D, Ergocalciferol, (DRISDOL) 50000 UNITS CAPS   Oral   Take 50,000 Units by mouth every 7 (seven) days.            BP 120/88  Pulse 98  Temp(Src) 98 F (36.7 C) (Oral)  Resp 16  SpO2 98% Physical Exam  Nursing note and vitals reviewed. Constitutional: He is oriented to person, place, and time. He appears well-developed and well-nourished. No distress.  HENT:  Head: Normocephalic.  Contusion to left forehead  Eyes: Conjunctivae and EOM are normal. Pupils are equal, round, and reactive to light.  Neck: Normal range of motion.  Cardiovascular: Normal rate, regular rhythm and intact distal pulses.   Pulmonary/Chest: Effort normal and breath sounds normal. No stridor. No respiratory distress. He has no wheezes. He has no rales. He exhibits no tenderness.  Abdominal: Soft. He exhibits no distension and no mass. There is no tenderness. There is no rebound and no guarding.  Musculoskeletal: Normal range of motion.  Neurological: He is alert and oriented to person, place, and time.  Oriented to self,  minimally verbal  Psychiatric: He has a normal mood and affect.    ED Course   Procedures (including critical care time)  Labs Reviewed  URINALYSIS, ROUTINE W REFLEX MICROSCOPIC - Abnormal; Notable for the following:    Color, Urine AMBER (*)    APPearance HAZY (*)    Bilirubin Urine MODERATE (*)    Protein, ur 100 (*)    All other components within normal limits  BASIC METABOLIC PANEL - Abnormal; Notable for the following:    Potassium 3.4 (*)    Glucose, Bld 140 (*)    GFR calc non Af Amer 72 (*)    GFR calc Af Amer 84 (*)    All other components within normal limits  CARBAMAZEPINE LEVEL, TOTAL - Abnormal; Notable for the following:    Carbamazepine Lvl <0.5 (*)    All other components within normal limits  URINE MICROSCOPIC-ADD ON - Abnormal; Notable for the following:    Squamous Epithelial / LPF FEW (*)    Bacteria, UA FEW (*)    Casts HYALINE CASTS (*)    All other components within normal limits  CBC WITH DIFFERENTIAL  HEPATIC FUNCTION PANEL   Dg Chest Port 1 View  04/07/2013    *RADIOLOGY REPORT*  Clinical Data: Mental retardation, seizure activity  CHEST - 1 VIEW  Comparison:  02/04/2011  Findings: The heart size and mediastinal contours are within normal limits.  Both lungs are clear.  IMPRESSION: No active disease.   Original Report Authenticated By: Judie Petit. Shick, M.D.   1. Seizure     MDM   Filed Vitals:   04/07/13 1740 04/07/13 1747  BP:  120/88  Pulse:  98  Temp:  98 F (36.7 C)  TempSrc:  Oral  Resp:  16  SpO2: 100% 98%     Marco [name] is a 62 y.o. male with past medical history significant for mental retardation, dementia and schizophrenia presenting with tonic-clonic seizure witnessed, lasting 1 minute. Back to baseline at this time. Blood work, urinalysis and chest x-ray show no significant abnormalities.  Neurology consult from Dr. Cyril Mourning appreciated: He recommends starting the patient on Keppra. States that the patient may have a history of  partial complex seizures, this is typically what Trileptal is used to 4. Recommends Keppra because it will cover both partial complex and generalized seizure. Recommends loading with 1 g IV and be  seen in 500 mg twice a day  Medications  levETIRAcetam (KEPPRA) 1,000 mg in sodium chloride 0.9 % 100 mL IVPB (1,000 mg Intravenous New Bag/Given 04/07/13 2023)   Pt is hemodynamically stable, appropriate for, and amenable to discharge at this time. Pt verbalized understanding and agrees with care plan. All questions answered. Outpatient follow-up and specific return precautions discussed.    New Prescriptions   LEVETIRACETAM (KEPPRA) 500 MG TABLET    Take 1 tablet (500 mg total) by mouth 2 (two) times daily.    Note: Portions of this report may have been transcribed using voice recognition software. Every effort was made to ensure accuracy; however, inadvertent computerized transcription errors may be present     [name], PA-C 04/07/13 2103

## 2013-04-08 NOTE — ED Provider Notes (Signed)
Medical screening examination/treatment/procedure(s) were performed by non-physician practitioner and as supervising physician I was immediately available for consultation/collaboration.   David H Yao, MD 04/08/13 1507 

## 2013-05-09 ENCOUNTER — Ambulatory Visit (INDEPENDENT_AMBULATORY_CARE_PROVIDER_SITE_OTHER): Payer: Medicaid Other | Admitting: Neurology

## 2013-05-09 ENCOUNTER — Encounter: Payer: Self-pay | Admitting: Neurology

## 2013-05-09 VITALS — BP 109/69 | HR 85 | Ht 69.0 in | Wt 144.0 lb

## 2013-05-09 DIAGNOSIS — R569 Unspecified convulsions: Secondary | ICD-10-CM | POA: Insufficient documentation

## 2013-05-09 NOTE — Progress Notes (Signed)
[facility]  Provider:  Dr Hosie Poisson Referring Provider: Richardean Canal, MD Primary Care Physician:  Provider Not In System  CC:  seizures  HPI:  Marco [name] is a 62 y.o. male here as a referral from Dr. Silverio Lay for new onset seizures  History is provided by a caregiver. Per the caregiver on August 4 patient was in other of his normal state of health, lateral screening, fell to the ground jerked all extremities noted labored breathing choking on saliva urinary incontinence, this lasted around 1 minute. He is disoriented afterwards and then returned to baseline. Was brought to the ER where he had a brief workup which was unremarkable. He had his Trileptal switched to Keppra 500 mg twice a day. Has had no further episodes since then. No prior seizure history. Was on Trileptal for mood stabilization. He has been on Wellbutrin for over one year. No signs of infection her head, around the event, no change of medication recently.  Patient lives in a group home, has baseline moderate mental retardation, schizophrenia, dementia. Denies any history of head trauma no recent falls.   Per ED Notes: 62y/o gentleman with hx of moderate mental retardation, dementia, schizophrenia in a group home p/w GTC seizure lasting , post-ictal after with urinary incontinence. Is on trileptal and wellbutrin  Review of Systems: Out of a complete 14 system review, the patient complains of only the following symptoms, and all other reviewed systems are negative. Positive for seizure, mental retardation  History   Social History  . Marital Status: Single    Spouse Name: N/A    Number of Children: N/A  . Years of Education: N/A   Occupational History  . Not on file.   Social History Main Topics  . Smoking status: Current Every Day Smoker -- 0.50 packs/day for 35 years    Types: Cigarettes  . Smokeless tobacco: Never Used  . Alcohol Use: No  . Drug Use: No  . Sexual Activity: No   Other  Topics Concern  . Not on file   Social History Narrative   Patient lives in a group with 3 other brothers.    Patient does not work.   Patient is single.    Patient has some HS education.    Patient has no children.    Patient has a history of psychotic disorder NOS and mental retardation.     History reviewed. No pertinent family history.  Past Medical History  Diagnosis Date  . Mental disorder     sczizophrenia;moderate retardation  . GERD (gastroesophageal reflux disease)   . Depression   . DEMENTIA   . Hypertension     no medications, no documented history per caregiver at preadmission  . Seizures     Past Surgical History  Procedure Laterality Date  . Cataract extraction w/phaco  07/12/2011    Procedure: CATARACT EXTRACTION PHACO AND INTRAOCULAR LENS PLACEMENT (IOC);  Surgeon: Shade Flood;  Location: [facility] OR;  Service: Ophthalmology;  Laterality: Left;    Current Outpatient Prescriptions  Medication Sig Dispense Refill  . bisacodyl (DULCOLAX) 5 MG EC tablet Take 10 mg by mouth daily as needed for constipation.       Marland Kitchen buPROPion (WELLBUTRIN XL) 300 MG 24 hr tablet Take 300 mg by mouth daily.       . cetirizine (ZYRTEC) 10 MG tablet Take 10 mg by mouth daily.       . Cholecalciferol 5000 UNITS capsule Take 5,000 Units by mouth every 7 (  seven) days.      Marland Kitchen donepezil (ARICEPT) 10 MG tablet Take 10 mg by mouth at bedtime.       . levETIRAcetam (KEPPRA) 500 MG tablet Take 1 tablet (500 mg total) by mouth 2 (two) times daily.  60 tablet  0  . LORazepam (ATIVAN) 1 MG tablet Take 2 mg by mouth at bedtime. For sleep      . omeprazole (PRILOSEC) 20 MG capsule Take 20 mg by mouth 2 (two) times daily.       . risperiDONE (RISPERDAL) 3 MG tablet Take 3 mg by mouth at bedtime.      . traZODone (DESYREL) 50 MG tablet Take 25 mg by mouth at bedtime as needed for sleep.       No current facility-administered medications for this visit.    Allergies as of 05/09/2013  . (No Known  Allergies)    Vitals: BP 109/69  Pulse 85  Ht 5\' 9"  (1.753 m)  Wt 144 lb (65.318 kg)  BMI 21.26 kg/m2 Last Weight:  Wt Readings from Last 1 Encounters:  05/09/13 144 lb (65.318 kg)   Last Height:   Ht Readings from Last 1 Encounters:  05/09/13 5\' 9"  (1.753 m)     Physical exam: Exam: Gen: NAD, conversant Eyes: anicteric sclerae, moist conjunctivae HENT: [place] Neck: Trachea midline; supple,  Lungs: CTA, no wheezing, rales, rhonic                          CV: RRR, no MRG Abdomen: Soft, non-tender;  Extremities: No peripheral edema  Skin: Normal temperature, no rash,  Psych: Appropriate affect, pleasant  Neuro: MS: Interactive, follows simple commands dysarthric difficult to understand speech   CN: PERRL, EOMI no nystagmus, no ptosis, sensation intact to LT V1-V3 bilat, face symmetric, no weakness, hearing grossly intact, shoulder shrug 5/5 bilat,  tongue protrudes midline, no fasiculations noted.  Motor: normal bulk and tone Strength: Moves all extremities spontaneously and symmetrically  Coord: Unable to test   Reflexes: symmetrical, bilat downgoing toes  Sens: LT intact in all extremities   Assessment:  After physical and neurologic examination, review of laboratory studies, imaging, neurophysiology testing and pre-existing records, assessment will be reviewed on the problem list.  Plan:  Treatment plan and additional workup will be reviewed under Problem List.  [name] is a pleasant 62 year old gentleman with a history of leak mental retardation, schizophrenia and dementia presenting for evaluation of new-onset seizure. He had one episode described as a generalized tonic-clonic seizure lasting around 1 minute. No prior history of seizure. Patient was on Trileptal at bedtime for mood stabilization. Per the ER was switched to Keppra 500 mg twice a day. Per his caregiver his mood has not changed in switching from Trileptal to Keppra. No further seizure  events. Physical exam appears unremarkable.  1)seizures 2)MR 3)Schizophrenia  -Suggest switching patient from Wellbutrin to other medication with lower seizure is, will defer to psychiatry. -Patient can switch back from Keppra to Trileptal, if indicated by psychiatry for mood stabilization -Routine EEG -No indication for MRI or lumbar puncture at this time as exam is unremarkable -Followup in 4-6 months

## 2013-05-09 NOTE — Patient Instructions (Signed)
Overall you are doing fairly well but I do want to suggest a few things today:   No driving   As far as your medications are concerned, I would like to suggest switching the Wellbutrin XL to a medication with less risk of causing seizures. Please follow up with psychiatry to determine if this is appropriate.  Can switch keppra back to trileptal if psychiatry feels is indicated for mood stabilization.  As far as diagnostic testing: I would like to get a EEG  I would like to see you back in 4 to 6 months, sooner if we need to. Please call us with any interim questions, concerns, problems, updates or refill requests.   Please also call us for any test results so we can go over those with you on the phone.  My clinical assistant and will answer any of your questions and relay your messages to me and also relay most of my messages to you.   Our phone number is [phone]. We also have an after hours call service for urgent matters and there is a physician on-call for urgent questions. For any emergencies you know to call 911 or go to the nearest emergency room

## 2013-05-27 ENCOUNTER — Ambulatory Visit (INDEPENDENT_AMBULATORY_CARE_PROVIDER_SITE_OTHER): Payer: Medicaid Other | Admitting: Radiology

## 2013-05-27 DIAGNOSIS — R569 Unspecified convulsions: Secondary | ICD-10-CM

## 2013-05-28 ENCOUNTER — Telehealth: Payer: Self-pay | Admitting: Neurology

## 2013-05-29 ENCOUNTER — Other Ambulatory Visit: Payer: Self-pay | Admitting: Neurology

## 2013-05-29 MED ORDER — LEVETIRACETAM 500 MG PO TABS: 500.0000 mg | ORAL_TABLET | Freq: Two times a day (BID) | ORAL | Status: DC

## 2013-05-29 NOTE — Telephone Encounter (Signed)
This pt needs refill for keppra, I will send to [facility], since he is on this medication, and may not have contacted psychiatry yet.

## 2013-07-01 NOTE — Procedures (Signed)
   [facility]  EEG (ELECTROENCEPHALOGRAM) REPORT   STUDY DATE: 05/27/13 PATIENT NAME: Marco [name] DOB: [birth year] MRN: [id number]  ORDERING CLINICIAN: Elspeth Cho, DO   TECHNOLOGIST: Kaylyn Lim TECHNIQUE: Electroencephalogram was recorded utilizing standard 10-20 system of lead placement and reformatted into average and bipolar montages.  RECORDING TIME: 20 minutes ACTIVATION: photic stimulation  CLINICAL INFORMATION: 62 year old male with mental retardation, dementia, schizophrenia, with new onset seizure.  FINDINGS: Eye movement artifact noted. Background rhythms of 10-11 hertz and 20-30 microvolts. No focal, lateralizing, epileptiform activity or seizures are seen. Patient recorded in the awake state.   IMPRESSION:  Normal EEG in the awake state.   INTERPRETING PHYSICIAN:  Suanne Marker, MD Certified in Neurology, Neurophysiology and Neuroimaging  [facility] [address], [address] [place], [place] [address] [phone]

## 2013-07-03 NOTE — Progress Notes (Signed)
Quick Note:  Shared Normal EEG results/ keppra instruction with [facility]), he understood ______

## 2013-09-07 ENCOUNTER — Inpatient Hospital Stay (HOSPITAL_COMMUNITY)
Admission: EM | Admit: 2013-09-07 | Discharge: 2013-09-10 | DRG: 101 | Disposition: A | Discharge status: Home / Self Care | Payer: Medicaid Other | Attending: Internal Medicine | Admitting: Internal Medicine

## 2013-09-07 ENCOUNTER — Encounter (HOSPITAL_COMMUNITY): Payer: Self-pay | Admitting: Emergency Medicine

## 2013-09-07 ENCOUNTER — Emergency Department (HOSPITAL_COMMUNITY): Payer: Medicaid Other

## 2013-09-07 DIAGNOSIS — F79 Unspecified intellectual disabilities: Secondary | ICD-10-CM | POA: Diagnosis present

## 2013-09-07 DIAGNOSIS — D696 Thrombocytopenia, unspecified: Secondary | ICD-10-CM | POA: Diagnosis present

## 2013-09-07 DIAGNOSIS — W19XXXA Unspecified fall, initial encounter: Secondary | ICD-10-CM | POA: Diagnosis present

## 2013-09-07 DIAGNOSIS — G40401 Other generalized epilepsy and epileptic syndromes, not intractable, with status epilepticus: Principal | ICD-10-CM | POA: Diagnosis present

## 2013-09-07 DIAGNOSIS — K5900 Constipation, unspecified: Secondary | ICD-10-CM

## 2013-09-07 DIAGNOSIS — F17200 Nicotine dependence, unspecified, uncomplicated: Secondary | ICD-10-CM | POA: Diagnosis present

## 2013-09-07 DIAGNOSIS — Y9210 Unspecified residential institution as the place of occurrence of the external cause: Secondary | ICD-10-CM | POA: Diagnosis present

## 2013-09-07 DIAGNOSIS — C3490 Malignant neoplasm of unspecified part of unspecified bronchus or lung: Secondary | ICD-10-CM | POA: Diagnosis present

## 2013-09-07 DIAGNOSIS — R918 Other nonspecific abnormal finding of lung field: Secondary | ICD-10-CM

## 2013-09-07 DIAGNOSIS — J309 Allergic rhinitis, unspecified: Secondary | ICD-10-CM | POA: Diagnosis present

## 2013-09-07 DIAGNOSIS — R1310 Dysphagia, unspecified: Secondary | ICD-10-CM | POA: Diagnosis present

## 2013-09-07 DIAGNOSIS — S0003XA Contusion of scalp, initial encounter: Secondary | ICD-10-CM | POA: Diagnosis present

## 2013-09-07 DIAGNOSIS — S0083XA Contusion of other part of head, initial encounter: Secondary | ICD-10-CM

## 2013-09-07 DIAGNOSIS — R9389 Abnormal findings on diagnostic imaging of other specified body structures: Secondary | ICD-10-CM

## 2013-09-07 DIAGNOSIS — G40901 Epilepsy, unspecified, not intractable, with status epilepticus: Secondary | ICD-10-CM

## 2013-09-07 DIAGNOSIS — K219 Gastro-esophageal reflux disease without esophagitis: Secondary | ICD-10-CM | POA: Diagnosis present

## 2013-09-07 DIAGNOSIS — R222 Localized swelling, mass and lump, trunk: Secondary | ICD-10-CM | POA: Diagnosis present

## 2013-09-07 DIAGNOSIS — S1093XA Contusion of unspecified part of neck, initial encounter: Secondary | ICD-10-CM

## 2013-09-07 DIAGNOSIS — G40909 Epilepsy, unspecified, not intractable, without status epilepticus: Secondary | ICD-10-CM

## 2013-09-07 DIAGNOSIS — Z720 Tobacco use: Secondary | ICD-10-CM

## 2013-09-07 HISTORY — DX: Unspecified disorder of psychological development: F89

## 2013-09-07 LAB — MAGNESIUM: MAGNESIUM: 2.1 mg/dL (ref 1.5–2.5)

## 2013-09-07 LAB — COMPREHENSIVE METABOLIC PANEL
ALK PHOS: 77 U/L (ref 39–117)
ALT: 25 U/L (ref 0–53)
AST: 27 U/L (ref 0–37)
Albumin: 3.9 g/dL (ref 3.5–5.2)
BILIRUBIN TOTAL: 0.4 mg/dL (ref 0.3–1.2)
BUN: 17 mg/dL (ref 6–23)
CHLORIDE: 98 meq/L (ref 96–112)
CO2: 19 meq/L (ref 19–32)
Calcium: 8.7 mg/dL (ref 8.4–10.5)
Creatinine, Ser: 1.23 mg/dL (ref 0.50–1.35)
GFR, EST AFRICAN AMERICAN: 70 mL/min — AB (ref >90)
GFR, EST NON AFRICAN AMERICAN: 60 mL/min — AB (ref >90)
GLUCOSE: 206 mg/dL — AB (ref 70–99)
POTASSIUM: 3.7 meq/L (ref 3.7–5.3)
Sodium: 143 mEq/L (ref 137–147)
Total Protein: 7.9 g/dL (ref 6.0–8.3)

## 2013-09-07 LAB — URINALYSIS, ROUTINE W REFLEX MICROSCOPIC
Bilirubin Urine: NEGATIVE
Glucose, UA: 250 mg/dL — AB
KETONES UR: NEGATIVE mg/dL
Leukocytes, UA: NEGATIVE
NITRITE: NEGATIVE
Protein, ur: 30 mg/dL — AB
Specific Gravity, Urine: 1.026 (ref 1.005–1.030)
UROBILINOGEN UA: 0.2 mg/dL (ref 0.0–1.0)
pH: 5 (ref 5.0–8.0)

## 2013-09-07 LAB — CBC WITH DIFFERENTIAL/PLATELET
Basophils Absolute: 0 10*3/uL (ref 0.0–0.1)
Basophils Relative: 0 % (ref 0–1)
EOS PCT: 0 % (ref 0–5)
Eosinophils Absolute: 0 10*3/uL (ref 0.0–0.7)
HCT: 47 % (ref 39.0–52.0)
Hemoglobin: 15.4 g/dL (ref 13.0–17.0)
LYMPHS ABS: 0.6 10*3/uL — AB (ref 0.7–4.0)
Lymphocytes Relative: 4 % — ABNORMAL LOW (ref 12–46)
MCH: 31.2 pg (ref 26.0–34.0)
MCHC: 32.8 g/dL (ref 30.0–36.0)
MCV: 95.3 fL (ref 78.0–100.0)
MONOS PCT: 9 % (ref 3–12)
Monocytes Absolute: 1.3 10*3/uL — ABNORMAL HIGH (ref 0.1–1.0)
NEUTROS PCT: 87 % — AB (ref 43–77)
Neutro Abs: 12.2 10*3/uL — ABNORMAL HIGH (ref 1.7–7.7)
PLATELETS: 147 10*3/uL — AB (ref 150–400)
RBC: 4.93 MIL/uL (ref 4.22–5.81)
RDW: 13.7 % (ref 11.5–15.5)
WBC: 14 10*3/uL — ABNORMAL HIGH (ref 4.0–10.5)

## 2013-09-07 LAB — URINE MICROSCOPIC-ADD ON

## 2013-09-07 LAB — POCT I-STAT, CHEM 8
BUN: 18 mg/dL (ref 6–23)
CALCIUM ION: 1.17 mmol/L (ref 1.13–1.30)
CHLORIDE: 100 meq/L (ref 96–112)
Creatinine, Ser: 1.4 mg/dL — ABNORMAL HIGH (ref 0.50–1.35)
Glucose, Bld: 214 mg/dL — ABNORMAL HIGH (ref 70–99)
HEMATOCRIT: 47 % (ref 39.0–52.0)
Hemoglobin: 16 g/dL (ref 13.0–17.0)
POTASSIUM: 3.6 meq/L — AB (ref 3.7–5.3)
SODIUM: 142 meq/L (ref 137–147)
TCO2: 21 mmol/L (ref 0–100)

## 2013-09-07 LAB — CBC
HEMATOCRIT: 44.3 % (ref 39.0–52.0)
Hemoglobin: 14.9 g/dL (ref 13.0–17.0)
MCH: 30.8 pg (ref 26.0–34.0)
MCHC: 33.6 g/dL (ref 30.0–36.0)
MCV: 91.7 fL (ref 78.0–100.0)
PLATELETS: 121 10*3/uL — AB (ref 150–400)
RBC: 4.83 MIL/uL (ref 4.22–5.81)
RDW: 13.6 % (ref 11.5–15.5)
WBC: 15 10*3/uL — ABNORMAL HIGH (ref 4.0–10.5)

## 2013-09-07 LAB — TROPONIN I
Troponin I: <0.3 ng/mL (ref <0.30)
Troponin I: <0.3 ng/mL (ref <0.30)
Troponin I: <0.3 ng/mL (ref <0.30)

## 2013-09-07 LAB — MRSA PCR SCREENING: MRSA by PCR: NEGATIVE

## 2013-09-07 LAB — GLUCOSE, CAPILLARY: GLUCOSE-CAPILLARY: 123 mg/dL — AB (ref 70–99)

## 2013-09-07 MED ORDER — PANTOPRAZOLE SODIUM 40 MG IV SOLR
40.0000 mg | Freq: Every day | INTRAVENOUS | Status: DC
  Administered 2013-09-07 – 2013-09-09 (×3): 40 mg via INTRAVENOUS
  Filled 2013-09-07 (×4): qty 40

## 2013-09-07 MED ORDER — LORAZEPAM 2 MG/ML IJ SOLN
2.0000 mg | Freq: Once | INTRAMUSCULAR | Status: AC
  Administered 2013-09-07: 2 mg via INTRAVENOUS

## 2013-09-07 MED ORDER — PNEUMOCOCCAL VAC POLYVALENT 25 MCG/0.5ML IJ INJ
0.5000 mL | INJECTION | INTRAMUSCULAR | Status: AC
  Administered 2013-09-08: 0.5 mL via INTRAMUSCULAR
  Filled 2013-09-07: qty 0.5

## 2013-09-07 MED ORDER — ACETAMINOPHEN 650 MG RE SUPP
650.0000 mg | Freq: Once | RECTAL | Status: AC
  Administered 2013-09-07: 650 mg via RECTAL
  Filled 2013-09-07: qty 1

## 2013-09-07 MED ORDER — SODIUM CHLORIDE 0.9 % IV SOLN: 250.0000 mL | INTRAVENOUS | Status: DC | PRN

## 2013-09-07 MED ORDER — SODIUM CHLORIDE 0.9 % IV SOLN: INTRAVENOUS | Status: DC

## 2013-09-07 MED ORDER — SODIUM CHLORIDE 0.9 % IV SOLN
1000.0000 mg | Freq: Once | INTRAVENOUS | Status: AC
  Administered 2013-09-07: 1000 mg via INTRAVENOUS
  Filled 2013-09-07: qty 10

## 2013-09-07 MED ORDER — HEPARIN SODIUM (PORCINE) 5000 UNIT/ML IJ SOLN
5000.0000 [IU] | Freq: Three times a day (TID) | INTRAMUSCULAR | Status: DC
  Administered 2013-09-07 – 2013-09-10 (×8): 5000 [IU] via SUBCUTANEOUS
  Filled 2013-09-07 (×13): qty 1

## 2013-09-07 MED ORDER — ACETAMINOPHEN 650 MG RE SUPP
650.0000 mg | RECTAL | Status: DC | PRN
  Administered 2013-09-07: 650 mg via RECTAL
  Filled 2013-09-07: qty 1

## 2013-09-07 MED ORDER — INFLUENZA VAC SPLIT QUAD 0.5 ML IM SUSP
0.5000 mL | INTRAMUSCULAR | Status: AC
  Administered 2013-09-08: 0.5 mL via INTRAMUSCULAR
  Filled 2013-09-07: qty 0.5

## 2013-09-07 MED ORDER — SODIUM CHLORIDE 0.9 % IV SOLN
750.0000 mg | Freq: Two times a day (BID) | INTRAVENOUS | Status: DC
  Administered 2013-09-07 – 2013-09-09 (×5): 750 mg via INTRAVENOUS
  Filled 2013-09-07 (×8): qty 7.5

## 2013-09-07 MED ORDER — LORAZEPAM 2 MG/ML IJ SOLN
INTRAMUSCULAR | Status: AC
  Filled 2013-09-07: qty 1

## 2013-09-07 NOTE — Consult Note (Signed)
NEURO HOSPITALIST CONSULT NOTE    Reason for Consult: Status epilepticus  HPI:                                                                                                                                          Marco [name] is an 63 y.o. male with a past medical history significant for developmental disability, isolated seizure last April, brought in by EMS after sustaining 2 seizures at the group home where he resides. He was started on keppra 500 mg BID at ht time of his first seizure in April and there is not reported concerns about compliance.  No recent fever, infection, sleep deprivation, or medical illnesses. The group home staff indicated that this morning around 4 am he woke up to go to he bathroom and fell to the floor and had a GTC seizure. He regained consciousness few later oh had another seizure and EMS was called. According to EMS he sustained 2 more seizure on route to the ED and received IV versed. Further seizures x 3 in he ED without regaining consciousness in between seizures. Total of 6 mg of IV ativan administered in the ED and no further clinical seizures ever since. WBC 14,000. Electrolytes unremarkable. U/a pending.  No past medical history on file.  No past surgical history on file.  No family history on file.  Family History: unknown at this time   Social History:  has no tobacco, alcohol, and drug history on file.  Allergies not on file  MEDICATIONS:                                                                                                                     I have reviewed the patient's current medications.   ROS: unable to obtain due to altered mental status.  History obtained from chart review and group home staff.  Physical exam: sedated s/p IV ativan and IV versed. Blood pressure  115/53, pulse 135, resp. rate 19, SpO2 100.00%. Head: normocephalic. Neck: supple, no bruits, no JVD. Cardiac: no murmurs. Lungs: clear. Abdomen: soft, no tender, no mass. Extremities: no edema.  Neurologic Examination:                                                                                                      Mental Status: Sedated s/p IV ativan and IV versed. Cranial Nerves: Pupils 2-3 mm bilaterally, reactive to light. No gaze preference. EOM full without nystagmus. Face appears to be symmetric. Motor: Some spontaneous movements left arm and mild symmetric limb movements upon painful stimuli. Tone and bulk:normal tone throughout; no atrophy noted Sensory: mild reaction to pain Deep Tendon Reflexes:  Right: Upper Extremity   Left: Upper extremity   biceps (C-5 to C-6) 2/4   biceps (C-5 to C-6) 2/4 tricep (C7) 2/4    triceps (C7) 2/4 Brachioradialis (C6) 2/4  Brachioradialis (C6) 2/4  Lower Extremity Lower Extremity  quadriceps (L-2 to L-4) 2/4   quadriceps (L-2 to L-4) 2/4 Achilles (S1) 2/4   Achilles (S1) 2/4  Plantars: Right: downgoing   Left: downgoing Cerebellar: Unable to test. Gait:  Unable to test. CV: pulses palpable throughout    No results found for this basename: cbc, bmp, coags, chol, tri, ldl, hga1c    Results for orders placed during the hospital encounter of 09/07/13 (from the past 48 hour(s))  POCT I-STAT, CHEM 8     Status: Abnormal   Collection Time    09/07/13  9:04 AM      Result Value Range   Sodium 142  137 - 147 mEq/L   Potassium 3.6 (*) 3.7 - 5.3 mEq/L   Chloride 100  96 - 112 mEq/L   BUN 18  6 - 23 mg/dL   Creatinine, Ser 1.40 (*) 0.50 - 1.35 mg/dL   Glucose, Bld 214 (*) 70 - 99 mg/dL   Calcium, Ion 1.17  1.13 - 1.30 mmol/L   TCO2 21  0 - 100 mmol/L   Hemoglobin 16.0  13.0 - 17.0 g/dL   HCT 47.0  39.0 - 52.0 %    Dg Chest Portable 1 View  09/07/2013   CLINICAL DATA:  Seizures and fall.  EXAM: PORTABLE CHEST - 1 VIEW   COMPARISON:  None.  FINDINGS: Single view of the chest was obtained. The left hilar structures appear to be more prominent compared to the previous examination. Otherwise, the lungs are clear. No evidence for a pneumothorax. Heart size is normal. Bony thorax is intact.  IMPRESSION: Prominence of the left hilar structures may be related to the projection of the image. Otherwise, the lungs are clear. Recommend a two-view chest exam when the patient is able.   Electronically Signed   By: Markus Daft M.D.   On: 09/07/2013 09:26   Assessment/Plan:  63 y/o with h/o isolated seizure last April, now with GTC SE. Currently no clinical seizure activity after  receiving 6 IV ativan in the ED and 5 mg IV versed on transport to [facility]. Recommend: 1) Load with 1 gram IV keppra now and continue IV keppra 750 mg BID. 2) CT brain. 3) EEG to exclude the possibility of NCSE. 4) Elevated white count but afebrile, perhaps seizure related. Will follow up.  Dorian Pod, MD 09/07/2013, 9:33 AM

## 2013-09-07 NOTE — H&P (Signed)
Name: Marco [name] MRN: [id number] DOB: 06-Feb-1951    LOS: 0  PCCM ADMISSION NOTE ADMISSION DATE: 09/07/2013   CONSULTATION DATE: 09/07/2013   REFERRING MD : Neurology/Camillo  PRIMARY SERVICE: PCCM   CHIEF COMPLAINT: Seizures  The patient is unable to provide history, which was obtained from available medical records and Marco [name].    History of Present Illness: Marco [name] is a 63 yo male with history significant for mental retardation,  tobacco abuse, and recent onset seizure x1 in April 2014.  He was evaluated by Neurology at that time, initiated on daily Keppra and follows with Dr. Janann Colonel of [facility]  Per his [place], Marco [name] was in his normal state of health without any further seizures since the single episode this past April until ~ 4 am in the morning on day of presentation when he awoke to use the restroom and had a tonic-clonic seizure.  EMS was not called at the time but he subsequently had another seizure shortly thereafter. He reportedly hit his head during both of the episodes and was noted to have blood on the back of his head. EMS was called and he had 2 additional seizures while in the ambulance and was given IV Versed. He had 3 more in the ED without regaining consciousness with a total of 6 mg of Ativan administered.  CT of Head in ED  was largely without acute abnormality other than posterior scalp hematoma and paranasal sinus thickening. The Group Home leader reports pt has not been ill nor missed any doses of his medication. Critical Care was consulted for admission for status epilepticus and airway protection in setting of post-ictal sedation.   Lines / Drains: 1/4 PIV  Cultures: 1/4 BCx >>>  Antibiotics: None  Tests / Events: SIGNIFICANT EVENTS / STUDIES:  1/4 Head CT >>> Mild posterior occipital scalp hematoma, no fracture; Mild ethmoid paranasal sinus disease. 1/4 CXR >>> Prominence of the left hilar structures     No  past medical history on file. No past surgical history on file. Prior to Admission medications   Medication Sig Start Date End Date Taking? Authorizing Provider  bisacodyl (DULCOLAX) 5 MG EC tablet Take 10 mg by mouth daily as needed for mild constipation or moderate constipation.   Yes Historical Provider, MD  cetirizine (ZYRTEC) 10 MG tablet Take 10 mg by mouth daily.   Yes Historical Provider, MD  Cholecalciferol (VITAMIN D PO) Take 5,000 Units by mouth once a week. Saturdays   Yes Historical Provider, MD  donepezil (ARICEPT) 10 MG tablet Take 10 mg by mouth at bedtime.   Yes Historical Provider, MD  levETIRAcetam (KEPPRA) 500 MG tablet Take 500 mg by mouth 2 (two) times daily.   Yes Historical Provider, MD  LORazepam (ATIVAN) 1 MG tablet Take 2 mg by mouth at bedtime.   Yes Historical Provider, MD  omeprazole (PRILOSEC) 20 MG capsule Take 20 mg by mouth 2 (two) times daily.   Yes Historical Provider, MD  risperiDONE (RISPERDAL) 3 MG tablet Take 3 mg by mouth at bedtime.   Yes Historical Provider, MD  traZODone (DESYREL) 50 MG tablet Take 25 mg by mouth at bedtime.   Yes Historical Provider, MD   Allergies Not on File  Family History No family history on file.  Social History  has no tobacco, alcohol, and drug history on file.  Review Of Systems  Patient unable to provide  Vital Signs: Temp:  [98.1 F (36.7 C)] 98.1 F (  36.7 C) (01/04 1057) Pulse Rate:  [127-143] 127 (01/04 1057) Resp:  [14-29] 16 (01/04 1057) BP: (115-156)/(53-97) 156/97 mmHg (01/04 1057) SpO2:  [100 %] 100 % (01/04 1057)    Physical Examination: General: thin AA male, post-ictal and sedated, HCPOA at bedside Marco [name]) Neuro:  Sedated, not following commands HEENT:  PERRLA Cardiovascular:  Tachycardic with reg rhythm, no M/R/G Lungs:  CTAB anterior fields Abdomen:  Flat, Soft, nondistended, normoactive bowel sounds Musculoskeletal: no pedal edema Skin:  No rash or ulcers noted    Labs and Imaging:   Reviewed.  Please refer to the Assessment and Plan section for relevant results.  ASSESSMENT AND PLAN Principal Problem:   Status epilepticus Active Problems:   GERD (gastroesophageal reflux disease)   Seizure disorder   Constipation   Tobacco abuse   Mental retardation  NEUROLOGIC A:  Status epilepticus now post-ictal sedation secondary to Ativan and Versed P: --> cont IV Keppra 750 mg BID per Neurology recommendations --> EEG when less sedated  PULMONARY A:  Abnormality on left lung in AP film, nodule?  Chronic tobacco abuse No respiratory distress P: --> repeat CXR 2-view 1/5 AM --> Continue O2 as needed  CARDIOVASCULAR A:  No acute issues P: --> trend troponins  RENAL  Recent Labs Lab 09/07/13 0900 09/07/13 0904  NA 143 142  K 3.7 3.6*  CL 98 100  CO2 19  --   BUN 17 18  CREATININE 1.23 1.40*  CALCIUM 8.7  --    A:  No active issues P: -->  BMP, Mg, Phos in AM -->  Cont to monitor and supplement electrolytes as needed  GASTROINTESTINAL  Recent Labs Lab 09/07/13 0900  AST 27  ALT 25  ALKPHOS 77  BILITOT 0.4  PROT 7.9  ALBUMIN 3.9   A:  H/o of constipation, no acute issues P: -->  Cont to monitor  HEMATOLOGIC  Recent Labs Lab 09/07/13 0900 09/07/13 0904  HGB 15.4 16.0  HCT 47.0 47.0  PLT 147*  --    A:  Mildly thrombocytopenic P: -->  CBC in AM   INFECTIOUS  Recent Labs Lab 09/07/13 0900  WBC 14.0*   A: Fever, leukocytosis may be secondary to seizure activity P: -->  Cont to monitor WBC, fever --> check blood culture, urine without evidence of infection, lungs clear, CXR w/o infiltrate --> discuss need for LP with neurology  ENDOCRINE A:  No active issues P: -->  CBG checks / SSI   BEST PRACTICE / DISPOSITION -->  ICU status under PCCM -->  Full code -->  NPO -->  Heparin [place] for DVT Px -->  Protonix IV for GI Px -->  HCPOA updated at bedside   Dorian Heckle, MD Internal Medicine Resident PGY3  09/07/2013,  11:02 AM   Attending:  I have seen and examined the patient with nurse practitioner/resident and agree with the note above.   CC time 40 minutes  Jillyn Hidden PCCM Pager: [phone] Cell: [phone] If no response, call [phone]

## 2013-09-07 NOTE — ED Notes (Signed)
Received pt from [place] at [address] with c/o seizure 1st at 0400, but EMS was not called at that time. Pt had another seizure at Group home and EMS was called. Pt had 3 seizures while with EMS. Pt Given 2.5 of versed by EMS x 2. Pt fell during seizure at group home. CBG 111 for EMS. Pt pulse ox dropped to 60% after last seizure. Pt was nasally intubated by EMS but loss placement. Pt arrived BVM with nasal trumpet.

## 2013-09-08 ENCOUNTER — Inpatient Hospital Stay (HOSPITAL_COMMUNITY): Payer: Medicaid Other

## 2013-09-08 DIAGNOSIS — G40909 Epilepsy, unspecified, not intractable, without status epilepticus: Secondary | ICD-10-CM

## 2013-09-08 DIAGNOSIS — K219 Gastro-esophageal reflux disease without esophagitis: Secondary | ICD-10-CM

## 2013-09-08 LAB — BASIC METABOLIC PANEL
BUN: 14 mg/dL (ref 6–23)
CHLORIDE: 102 meq/L (ref 96–112)
CO2: 28 mEq/L (ref 19–32)
Calcium: 8.6 mg/dL (ref 8.4–10.5)
Creatinine, Ser: 0.85 mg/dL (ref 0.50–1.35)
GFR calc non Af Amer: >90 mL/min (ref >90)
Glucose, Bld: 97 mg/dL (ref 70–99)
POTASSIUM: 3.6 meq/L — AB (ref 3.7–5.3)
Sodium: 144 mEq/L (ref 137–147)

## 2013-09-08 LAB — CBC
HCT: 45.4 % (ref 39.0–52.0)
HEMOGLOBIN: 15.2 g/dL (ref 13.0–17.0)
MCH: 30.7 pg (ref 26.0–34.0)
MCHC: 33.5 g/dL (ref 30.0–36.0)
MCV: 91.7 fL (ref 78.0–100.0)
Platelets: 129 10*3/uL — ABNORMAL LOW (ref 150–400)
RBC: 4.95 MIL/uL (ref 4.22–5.81)
RDW: 13.5 % (ref 11.5–15.5)
WBC: 15.9 10*3/uL — ABNORMAL HIGH (ref 4.0–10.5)

## 2013-09-08 LAB — GLUCOSE, CAPILLARY: Glucose-Capillary: 105 mg/dL — ABNORMAL HIGH (ref 70–99)

## 2013-09-08 MED ORDER — LORAZEPAM 2 MG/ML IJ SOLN
2.0000 mg | INTRAMUSCULAR | Status: DC | PRN
  Administered 2013-09-09: 2 mg via INTRAVENOUS
  Filled 2013-09-08: qty 1

## 2013-09-08 MED ORDER — HALOPERIDOL LACTATE 5 MG/ML IJ SOLN
1.0000 mg | INTRAMUSCULAR | Status: DC | PRN
  Administered 2013-09-08: 1 mg via INTRAVENOUS
  Filled 2013-09-08: qty 1

## 2013-09-08 MED ORDER — DEXTROSE 5 % IV SOLN
INTRAVENOUS | Status: DC
  Administered 2013-09-08 – 2013-09-09 (×2): via INTRAVENOUS

## 2013-09-08 NOTE — Progress Notes (Signed)
EEG completed; results pending.    

## 2013-09-08 NOTE — Progress Notes (Signed)
Name: Marco [name] MRN: [id number] DOB: [birth year]    LOS: 1   ADMISSION DATE: 09/07/2013   CONSULTATION DATE: 09/07/2013   REFERRING MD : Neurology/Camillo  PRIMARY SERVICE: PCCM   CHIEF COMPLAINT: Seizures  BRIEF: 63 yo male with status epilepticus.  First seizure in April 2014.  Hx of mental retardation.  PCCM asked to admit to ICU.  SIGNIFICANT EVENTS: 1/4 Admit, neurology consulted  STUDIES: 1/4 Head CT >>> Mild posterior occipital scalp hematoma, no fracture; Mild ethmoid paranasal sinus disease. 1/5 EEG >>>   Lines / Drains: 1/4 PIV  Cultures: 1/4 BCx >>>  Antibiotics: None  Subjective: No further seizures.  Remains confused.   Vital Signs: Temp:  [98.1 F (36.7 C)-101.8 F (38.8 C)] 98.2 F (36.8 C) (01/05 0800) Pulse Rate:  [103-129] 103 (01/05 0600) Resp:  [10-32] 23 (01/05 0900) BP: (111-156)/(67-114) 133/76 mmHg (01/05 0900) SpO2:  [94 %-100 %] 94 % (01/05 0900) Weight:  [148 lb 2.4 oz (67.2 kg)-149 lb 11.1 oz (67.9 kg)] 148 lb 2.4 oz (67.2 kg) (01/05 0500) I/O last 3 completed shifts: In: 602.5 [I.V.:495; IV Piggyback:107.5] Out: 650 [Urine:650]  Physical Examination: General: no distress  Neuro:  Sleepy, wakes up easily, confused, follows simple commands, moves all extremities HEENT: Pupils reactive, no sinus tenderness Cardiovascular: regular, tachycardic Lungs: no wheeze Abdomen: soft, non tender Musculoskeletal: no edema Skin:  No rashes  Labs: CBC Recent Labs     09/07/13  0900  09/07/13  0904  09/07/13  2217  09/08/13  0415  WBC  14.0*   --   15.0*  15.9*  HGB  15.4  16.0  14.9  15.2  HCT  47.0  47.0  44.3  45.4  PLT  147*   --   121*  129*   BMET Recent Labs     09/07/13  0900  09/07/13  0904  09/08/13  0415  NA  143  142  144  K  3.7  3.6*  3.6*  CL  98  100  102  CO2  19   --   28  BUN  17  18  14   CREATININE  1.23  1.40*  0.85  GLUCOSE  206*  214*  97    Electrolytes Recent Labs     09/07/13  0900   09/07/13  1300  09/08/13  0415  CALCIUM  8.7   --   8.6  MG   --   2.1   --    Liver Enzymes Recent Labs     09/07/13  0900  AST  27  ALT  25  ALKPHOS  77  BILITOT  0.4  ALBUMIN  3.9    Cardiac Enzymes Recent Labs     09/07/13  1300  09/07/13  1602  09/07/13  2217  TROPONINI  <0.30  <0.30  <0.30    Glucose Recent Labs     09/07/13  2346  09/08/13  0456  GLUCAP  123*  105*    Imaging Ct Head Wo Contrast  09/07/2013   CLINICAL DATA:  Multiple seizures, unresponsive  EXAM: CT HEAD WITHOUT CONTRAST  TECHNIQUE: Contiguous axial images were obtained from the base of the skull through the vertex without intravenous contrast.  COMPARISON:  None.  FINDINGS: Negative for acute intracranial hemorrhage, acute infarction, mass, mass effect, hydrocephalus or midline shift. Gray-white differentiation is preserved throughout. Mild soft tissue swelling in the occipital scalp consistent with hematoma. No underlying calvarial fracture. The globes  and orbits are symmetric and unremarkable. Normal aeration of the mastoid air cells. Minimal mucoperiosteal thickening in the ethmoid air cells.  IMPRESSION: 1. No acute intracranial abnormality. 2. Mild posterior occipital scalp hematoma without evidence of underlying fracture. 3. Mild ethmoid paranasal sinus disease.   Electronically Signed   By: Jacqulynn Cadet M.D.   On: 09/07/2013 10:28   Dg Chest Portable 1 View  09/07/2013   CLINICAL DATA:  Seizures and fall.  EXAM: PORTABLE CHEST - 1 VIEW  COMPARISON:  None.  FINDINGS: Single view of the chest was obtained. The left hilar structures appear to be more prominent compared to the previous examination. Otherwise, the lungs are clear. No evidence for a pneumothorax. Heart size is normal. Bony thorax is intact.  IMPRESSION: Prominence of the left hilar structures may be related to the projection of the image. Otherwise, the lungs are clear. Recommend a two-view chest exam when the patient is able.    Electronically Signed   By: Markus Daft M.D.   On: 09/07/2013 09:26    ASSESSMENT AND PLAN  NEUROLOGIC A:   Status epilepticus with hx of seizure disorder. Hx of mental retardation. P: -AED's per ID -f/u EEG -?if he needs additional brain imaging studies >> defer to neurology -hold outpt aricept, risperdal, trazodone for now  PULMONARY A:   ?hilar prominence of CXR 1/4. Hx of tobacco abuse. Allergic rhinitis. P: -change to portable chest xray 1/5 -oxygen as needed to keep SpO2 > 92% -monitor for aspiration -may need to avoid anti-histamines in setting of seizures >> was on zyrtec as outpt  CARDIOVASCULAR A:   No acute issues. P: -monitor hemodynamics  RENAL A:   No active issues. P: -monitor renal fx, urine outpt, electrolytes  GASTROINTESTINAL A:   Nutrition. Dysphagia. Hx of GERD. P: -NPO -Protonix for SUP >> uses omeprazole as outpt -speech to assess swallowing  HEMATOLOGIC A:   Mildly thrombocytopenic. P: -f/u CBC -SQ heparin for DVT prevention  INFECTIOUS A:  Fever, leukocytosis may be secondary to seizure activity. P: -monitor clinically  ENDOCRINE A:   No active issues P: -monitor CBG's  Updated caregiver at bedside.  CC time 35 minutes.  Chesley Mires, MD [facility] 09/08/2013, 10:04 AM Pager:  [phone] After 3pm call: [phone]

## 2013-09-08 NOTE — Progress Notes (Signed)
NEURO HOSPITALIST PROGRESS NOTE   SUBJECTIVE:                                                                                                                        Patient is doing much better today per daughter who is at bedside.  He has baseline developmental delay and at baseline can only follow simple commands.  She has noted his speech remains slurred but again, improving.   OBJECTIVE:                                                                                                                           Vital signs in last 24 hours: Temp:  [98.2 F (36.8 C)-101.8 F (38.8 C)] 98.2 F (36.8 C) (01/05 0800) Pulse Rate:  [103-124] 103 (01/05 0600) Resp:  [10-32] 20 (01/05 1000) BP: (111-147)/(67-114) 135/79 mmHg (01/05 1000) SpO2:  [94 %-100 %] 96 % (01/05 1000) Weight:  [67.2 kg (148 lb 2.4 oz)-67.9 kg (149 lb 11.1 oz)] 67.2 kg (148 lb 2.4 oz) (01/05 0500)  Intake/Output from previous day: 01/04 0701 - 01/05 0700 In: 602.5 [I.V.:495; IV Piggyback:107.5] Out: 650 [Urine:650] Intake/Output this shift: Total I/O In: 167.5 [I.V.:60; IV Piggyback:107.5] Out: 150 [Urine:150] Nutritional status: NPO  Past Medical History  Diagnosis Date  . GERD (gastroesophageal reflux disease)   . Seizures   . Developmental disability     developmentaly delayed   Neurologic Exam:  Mental Status: Alert, able to follow only very simple commands such as open and close eyes, showing me two fingers, holding arms up. Speech dysarthric and not able to make out what he is saying.  Cranial Nerves: II: Visual fields in right eye grossly normal, pupils equal, round, reactive to light and accommodation.  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  Tone and bulk:normal tone throughout; no atrophy noted Sensory: Pinprick and light touch intact throughout, bilaterally Deep Tendon Reflexes:  2+ throughout  UE and LE  Plantars: Right: downgoing   Left: upgoing Cerebellar: Unable to assess   Lab Results: No results found for this basename: cbc, bmp, coags, chol, tri, ldl, hga1c   Lipid Panel No results found for this basename: CHOL, TRIG, HDL, CHOLHDL, VLDL, LDLCALC,  in the last 72 hours  Studies/Results: Ct Head Wo Contrast  09/07/2013   CLINICAL DATA:  Multiple seizures, unresponsive  EXAM: CT HEAD WITHOUT CONTRAST  TECHNIQUE: Contiguous axial images were obtained from the base of the skull through the vertex without intravenous contrast.  COMPARISON:  None.  FINDINGS: Negative for acute intracranial hemorrhage, acute infarction, mass, mass effect, hydrocephalus or midline shift. Gray-white differentiation is preserved throughout. Mild soft tissue swelling in the occipital scalp consistent with hematoma. No underlying calvarial fracture. The globes and orbits are symmetric and unremarkable. Normal aeration of the mastoid air cells. Minimal mucoperiosteal thickening in the ethmoid air cells.  IMPRESSION: 1. No acute intracranial abnormality. 2. Mild posterior occipital scalp hematoma without evidence of underlying fracture. 3. Mild ethmoid paranasal sinus disease.   Electronically Signed   By: Jacqulynn Cadet M.D.   On: 09/07/2013 10:28   Dg Chest Portable 1 View  09/07/2013   CLINICAL DATA:  Seizures and fall.  EXAM: PORTABLE CHEST - 1 VIEW  COMPARISON:  None.  FINDINGS: Single view of the chest was obtained. The left hilar structures appear to be more prominent compared to the previous examination. Otherwise, the lungs are clear. No evidence for a pneumothorax. Heart size is normal.  Bony thorax is intact.  IMPRESSION: Prominence of the left hilar structures may be related to the projection of the image. Otherwise, the lungs are clear. Recommend a two-view chest exam when the patient is able.   Electronically Signed   By: Markus Daft M.D.   On: 09/07/2013 09:26    MEDICATIONS                                                                                                                        Scheduled: . heparin  5,000 Units Subcutaneous Q8H  . levETIRAcetam  750 mg Intravenous Q12H  . pantoprazole (PROTONIX) IV  40 mg Intravenous QHS    ASSESSMENT/PLAN:                                                                                                             63 y/o with h/o isolated seizure last April, now presenting to ED with  GTC SE.  Patient shows no further seizures while  on Keppra 750 mg BID IV.  His mental status continues to improve but per daughter his speech is dysarthric.  EEG shows no epileptiform activity (formal reading to follow).   Recommend: 1) Continue Keppra 750 mg BID 2) Due to continued dysarthria and right arm drift will obtain MRI brain.] 3) WBC continues to elevate--PCCM following  Will follow.   Assessment and plan discussed with with attending physician and they are in agreement.    Etta Quill PA-C [facility] [phone]  09/08/2013, 11:00 AM

## 2013-09-08 NOTE — Procedures (Signed)
EEG report.  Brief clinical history:  63 y/o with recently diagnosed seizures admitted with GTC SE. This  Technique: this is a 17 channel routine scalp EEG performed at the bedside with bipolar and monopolar montages arranged in accordance to the international 10/20 system of electrode placement. One channel was dedicated to EKG recording.  The study was performed during wakefulness and 2 sleep. No activating procedures performed.  Description:In the wakeful state, the best background consisted of a medium amplitude, posterior dominant, well sustained, symmetric and reactive 10 Hz rhythm. Drowsiness demonstrated dropout of the alpha rhythm. Stage 2 sleep showed symmetric and synchronous sleep spindles without intermixed epileptiform discharges.  No focal or generalized epileptiform discharges noted.  No slowing seen.  EKG showed sinus rhythm.  Impression: this is a normal awake and asleep EEG. Please, be aware that a normal EEG does not exclude the possibility of epilepsy.  Clinical correlation is advised.  Dorian Pod, MD

## 2013-09-09 ENCOUNTER — Inpatient Hospital Stay (HOSPITAL_COMMUNITY): Payer: Medicaid Other

## 2013-09-09 ENCOUNTER — Encounter (HOSPITAL_COMMUNITY): Payer: Self-pay | Admitting: *Deleted

## 2013-09-09 LAB — LEVETIRACETAM LEVEL: Levetiracetam Lvl: 30 ug/mL (ref 5.0–30.0)

## 2013-09-09 LAB — CBC
HCT: 46.1 % (ref 39.0–52.0)
Hemoglobin: 15.5 g/dL (ref 13.0–17.0)
MCH: 31 pg (ref 26.0–34.0)
MCHC: 33.6 g/dL (ref 30.0–36.0)
MCV: 92.2 fL (ref 78.0–100.0)
Platelets: 115 10*3/uL — ABNORMAL LOW (ref 150–400)
RBC: 5 MIL/uL (ref 4.22–5.81)
RDW: 13.4 % (ref 11.5–15.5)
WBC: 12.2 10*3/uL — AB (ref 4.0–10.5)

## 2013-09-09 LAB — COMPREHENSIVE METABOLIC PANEL
ALT: 27 U/L (ref 0–53)
AST: 56 U/L — ABNORMAL HIGH (ref 0–37)
Albumin: 3.1 g/dL — ABNORMAL LOW (ref 3.5–5.2)
Alkaline Phosphatase: 64 U/L (ref 39–117)
BUN: 17 mg/dL (ref 6–23)
CO2: 26 meq/L (ref 19–32)
Calcium: 8.6 mg/dL (ref 8.4–10.5)
Chloride: 103 mEq/L (ref 96–112)
Creatinine, Ser: 0.89 mg/dL (ref 0.50–1.35)
GFR calc Af Amer: >90 mL/min (ref >90)
GFR, EST NON AFRICAN AMERICAN: 88 mL/min — AB (ref >90)
Glucose, Bld: 89 mg/dL (ref 70–99)
POTASSIUM: 4.1 meq/L (ref 3.7–5.3)
SODIUM: 143 meq/L (ref 137–147)
Total Bilirubin: 0.7 mg/dL (ref 0.3–1.2)
Total Protein: 7 g/dL (ref 6.0–8.3)

## 2013-09-09 LAB — PHOSPHORUS: Phosphorus: 2.2 mg/dL — ABNORMAL LOW (ref 2.3–4.6)

## 2013-09-09 LAB — MAGNESIUM: Magnesium: 2.4 mg/dL (ref 1.5–2.5)

## 2013-09-09 MED ORDER — IOHEXOL 300 MG/ML  SOLN
75.0000 mL | Freq: Once | INTRAMUSCULAR | Status: AC | PRN
  Administered 2013-09-09: 75 mL via INTRAVENOUS

## 2013-09-09 NOTE — Clinical Social Work Note (Signed)
Clinical Social Work Department BRIEF PSYCHOSOCIAL ASSESSMENT 09/09/2013  Patient:  Marco [name], Marco [name]     Account Number:  [id number]     Admit date:  09/07/2013  Clinical Social Worker:  Myles Lipps  Date/Time:  09/09/2013 11:22 AM  Referred by:  RN  Date Referred:  09/09/2013 Referred for  ALF Placement   Other Referral:   Interview type:  Other - See comment Other interview type:   Patient guardian from the group home    PSYCHOSOCIAL DATA Living Status:  FACILITY Admitted from facility:   Level of care:  Group Home Primary support name:  Boyce Medici [phone] Primary support relationship to patient:  SITTER Degree of support available:   Strong    CURRENT CONCERNS Current Concerns  Post-Acute Placement   Other Concerns:   Return to [place] ([place])    SOCIAL WORK ASSESSMENT / PLAN Clinical Social Worker met with patient and sitter at bedside to offer support and discuss patient needs at discharge.  Patient sitter Narda Amber) states that patient has lived at the group home his whole life.  The home in which him and his 10 siblings grew up was transitioned by their mother to a regulated group home prior to her death. Patient and 4 other siblings were born developmentally delayed and all live in the group home ([facility]) together. Patient loves his home and wants to return.  CSW to complete FL2 and follow up with [facility] once medically stable.  CSW remains available for emotional support and to facilitate patient discharge needs.   Assessment/plan status:  Psychosocial Support/Ongoing Assessment of Needs Other assessment/ plan:   Information/referral to community resources:   Clinical Social Worker offered patient sitter additional resources, however she states that the group home is more like a family and this is patient first hospitalization since birth.  CSW remains available for additional resources if deemed necessary during hospitalization.     PATIENT'S/FAMILY'S RESPONSE TO PLAN OF CARE: Patient alert and oriented to self.  Patient does have developmental delays and is essentially back to baseline per sitter.  Patient does attempt to engage in meaningful conversation and does appreciate when he is addressed. Patient sitter remains very involved in patient care and has been at bedside consistently with patient during hospitalization.  Patient sitter states that patient will definitely be returning to [place] once medically stable.  Patient sitter understanding of social work role and verbalized her appreciation for CSW support and concern.

## 2013-09-09 NOTE — Progress Notes (Signed)
PT Cancellation Note  Patient Details Name: Marco [name] MRN: [id number] DOB: 12-Jan-1951   Cancelled Treatment:    Reason Eval/Treat Not Completed: Patient at procedure or test/unavailable.  Pt is currently being wheeled off the floor for a test.  PT to check back tomorrow.     Barbarann Ehlers [place], [place], DPT [phone]   09/09/2013, 4:07 PM

## 2013-09-09 NOTE — Progress Notes (Signed)
Name: Marco [name] MRN: [id number] DOB: 1950/12/30    LOS: 2   ADMISSION DATE: 09/07/2013   CONSULTATION DATE: 09/07/2013   REFERRING MD : Neurology/Camillo  PRIMARY SERVICE: PCCM   CHIEF COMPLAINT: Seizures  BRIEF: 63 yo male with status epilepticus.  First seizure in April 2014.  Hx of mental retardation.  PCCM asked to admit to ICU.  SIGNIFICANT EVENTS: 1/4 Admit, neurology consulted  STUDIES: 1/4 Head CT >>> Mild posterior occipital scalp hematoma, no fracture; Mild ethmoid paranasal sinus disease. 1/5 EEG >>> normal  Lines / Drains: 1/4 PIV  Cultures: 1/4 BCx >>>  Antibiotics: None  Subjective: No further seizures.  More alert.  Caregiver reports pt close to baseline mental function.   Vital Signs: Temp:  [97.6 F (36.4 C)-99.7 F (37.6 C)] 97.6 F (36.4 C) (01/06 0800) Pulse Rate:  [45-106] 85 (01/06 0800) Resp:  [14-25] 21 (01/06 0800) BP: (112-135)/(63-108) 120/70 mmHg (01/06 0800) SpO2:  [92 %-99 %] 99 % (01/06 0800) Weight:  [143 lb 1.3 oz (64.9 kg)] 143 lb 1.3 oz (64.9 kg) (01/06 0500) I/O last 3 completed shifts: In: 1642.3 [I.V.:1319.8; IV Piggyback:322.5] Out: 1310 [Urine:1310] Room air  Physical Examination: General: no distress  Neuro: Alert, follows simple commands, moves all extremities HEENT: Pupils reactive, no sinus tenderness Cardiovascular: regular, no murmur Lungs: no wheeze Abdomen: soft, non tender Musculoskeletal: no edema Skin:  No rashes  Labs: CBC Recent Labs     09/07/13  2217  09/08/13  0415  09/09/13  0415  WBC  15.0*  15.9*  12.2*  HGB  14.9  15.2  15.5  HCT  44.3  45.4  46.1  PLT  121*  129*  115*   BMET Recent Labs     09/07/13  0900  09/07/13  0904  09/08/13  0415  09/09/13  0415  NA  143  142  144  143  K  3.7  3.6*  3.6*  4.1  CL  98  100  102  103  CO2  19   --   28  26  BUN  17  18  14  17   CREATININE  1.23  1.40*  0.85  0.89  GLUCOSE  206*  214*  97  89    Electrolytes Recent Labs   09/07/13  0900  09/07/13  1300  09/08/13  0415  09/09/13  0415  CALCIUM  8.7   --   8.6  8.6  MG   --   2.1   --   2.4  PHOS   --    --    --   2.2*   Liver Enzymes Recent Labs     09/07/13  0900  09/09/13  0415  AST  27  56*  ALT  25  27  ALKPHOS  77  64  BILITOT  0.4  0.7  ALBUMIN  3.9  3.1*    Cardiac Enzymes Recent Labs     09/07/13  1300  09/07/13  1602  09/07/13  2217  TROPONINI  <0.30  <0.30  <0.30    Glucose Recent Labs     09/07/13  2346  09/08/13  0456  GLUCAP  123*  105*    Imaging Ct Head Wo Contrast  09/07/2013   CLINICAL DATA:  Multiple seizures, unresponsive  EXAM: CT HEAD WITHOUT CONTRAST  TECHNIQUE: Contiguous axial images were obtained from the base of the skull through the vertex without intravenous contrast.  COMPARISON:  None.  FINDINGS: Negative for acute intracranial hemorrhage, acute infarction, mass, mass effect, hydrocephalus or midline shift. Gray-white differentiation is preserved throughout. Mild soft tissue swelling in the occipital scalp consistent with hematoma. No underlying calvarial fracture. The globes and orbits are symmetric and unremarkable. Normal aeration of the mastoid air cells. Minimal mucoperiosteal thickening in the ethmoid air cells.  IMPRESSION: 1. No acute intracranial abnormality. 2. Mild posterior occipital scalp hematoma without evidence of underlying fracture. 3. Mild ethmoid paranasal sinus disease.   Electronically Signed   By: Jacqulynn Cadet M.D.   On: 09/07/2013 10:28   Dg Chest Port 1 View  09/08/2013   CLINICAL DATA:  Shortness of breath.  Follow-up hilar prominence.  EXAM: PORTABLE CHEST - 1 VIEW  COMPARISON:  09/07/2013. Because of now merged separate accounts, the patient has older prior studies which are now available for comparison, dated 04/07/2013 and 02/04/2011.  FINDINGS: 1125 hr. There are lower lung volumes with patchy perihilar and basilar pulmonary opacities bilaterally. As suggested on yesterday's  study, there is a possible new left hilar mass. No significant pleural effusion is identified. The heart size is normal. Telemetry leads overlie the chest.  IMPRESSION: 1. New perihilar pulmonary opacities bilaterally may reflect atelectasis, edema or early inflammation. 2. Persistent concern of left hilar mass. Chest CT is recommended for further evaluation, ideally with contrast once the patient's acute pulmonary process has resolved.   Electronically Signed   By: Camie Patience M.D.   On: 09/08/2013 12:44   Dg Chest Portable 1 View  09/07/2013   CLINICAL DATA:  Seizures and fall.  EXAM: PORTABLE CHEST - 1 VIEW  COMPARISON:  None.  FINDINGS: Single view of the chest was obtained. The left hilar structures appear to be more prominent compared to the previous examination. Otherwise, the lungs are clear. No evidence for a pneumothorax. Heart size is normal. Bony thorax is intact.  IMPRESSION: Prominence of the left hilar structures may be related to the projection of the image. Otherwise, the lungs are clear. Recommend a two-view chest exam when the patient is able.   Electronically Signed   By: Markus Daft M.D.   On: 09/07/2013 09:26    ASSESSMENT AND PLAN  NEUROLOGIC A:   Status epilepticus with hx of seizure disorder >> mental status improved 1/6. Hx of mental retardation. P: -AED's per ID -f/u MRI -hold outpt aricept, risperdal, trazodone for now  PULMONARY A:   ?hilar prominence of CXR 1/4 >> persistent on CXR 1/5. Hx of tobacco abuse. Allergic rhinitis. P: -will get CT chest with IV contrast -oxygen as needed to keep SpO2 > 92% -monitor for aspiration -may need to avoid anti-histamines in setting of seizures >> was on zyrtec as outpt  CARDIOVASCULAR A:   No acute issues. P: -monitor hemodynamics  RENAL A:   No active issues. P: -monitor renal fx, urine outpt, electrolytes  GASTROINTESTINAL A:   Nutrition. Dysphagia. Hx of GERD. P: -NPO -Protonix for SUP >> uses  omeprazole as outpt -speech to assess swallowing  HEMATOLOGIC A:   Mildly thrombocytopenic. P: -f/u CBC -SQ heparin for DVT prevention  INFECTIOUS A:  Fever, leukocytosis may be secondary to seizure activity. P: -monitor clinically  ENDOCRINE A:   No active issues P: -monitor CBG's  Updated caregiver at bedside.  Transfer to telemetry.  Will ask [facility] to assume care from 1/7.  PCCM will f/u to assess CT chest findings.  Chesley Mires, MD [facility] Pulmonary/Critical Care 09/09/2013, 8:59 AM Pager:  [phone] After 3pm call: [phone]

## 2013-09-09 NOTE — Evaluation (Signed)
Clinical/Bedside Swallow Evaluation Patient Details  Name: Marco [name] MRN: [id number] Date of Birth: April 28, 1951  Today's Date: 09/09/2013 Time: 5916-3846 SLP Time Calculation (min): 10 min  Past Medical History:  Past Medical History  Diagnosis Date  . GERD (gastroesophageal reflux disease)   . Seizures   . Developmental disability     developmentaly delayed   Past Surgical History:  Past Surgical History  Procedure Laterality Date  . Eye surgery      L eye   HPI:  Marco [name] is a 63 yo male with history significant for mental retardation,  tobacco abuse, and recent onset seizure x1 in April 2014.  He was evaluated by Neurology at that time, initiated on daily Keppra and follows with Dr. Janann Colonel of [facility]  Per his [place], Marco [name] was in his normal state of health without any further seizures since the single episode this past April until ~ 4 am in the morning on day of presentation when he awoke to use the restroom and had a tonic-clonic seizure.  EMS was not called at the time but he subsequently had another seizure shortly thereafter. He reportedly hit his head during both of the episodes and was noted to have blood on the back of his head. EMS was called and he had 2 additional seizures while in the ambulance and was given IV Versed. He had 3 more in the ED without regaining consciousness with a total of 6 mg of Ativan administered.  CT of Head in ED  was largely without acute abnormality other than posterior scalp hematoma and paranasal sinus thickening. The Group Home leader reports pt has not been ill nor missed any doses of his medication. Critical Care was consulted for admission for status epilepticus and airway protection in setting of post-ictal sedation.   Assessment / Plan / Recommendation Clinical Impression  Pt demonstrates adequate swallow function. He is impulsive at baseline due to intellectual disability. Family reminded to  follow basic precautions - upright posture, limiting speed of intake. No SLP f/u needed. Will sign off.     Aspiration Risk  Mild    Diet Recommendation Regular;Thin liquid   Liquid Administration via: Cup;Straw Medication Administration: Whole meds with liquid    Other  Recommendations     Follow Up Recommendations  None    Frequency and Duration        Pertinent Vitals/Pain NA    SLP Swallow Goals     Swallow Study Prior Functional Status       General HPI: Marco [name] is a 63 yo male with history significant for mental retardation,  tobacco abuse, and recent onset seizure x1 in April 2014.  He was evaluated by Neurology at that time, initiated on daily Keppra and follows with Dr. Janann Colonel of [facility].  Per his [place], Marco [name] was in his normal state of health without any further seizures since the single episode this past April until ~ 4 am in the morning on day of presentation when he awoke to use the restroom and had a tonic-clonic seizure.  EMS was not called at the time but he subsequently had another seizure shortly thereafter. He reportedly hit his head during both of the episodes and was noted to have blood on the back of his head. EMS was called and he had 2 additional seizures while in the ambulance and was given IV Versed. He had 3 more in the ED without regaining consciousness  with a total of 6 mg of Ativan administered.  CT of Head in ED  was largely without acute abnormality other than posterior scalp hematoma and paranasal sinus thickening. The Group Home leader reports pt has not been ill nor missed any doses of his medication. Critical Care was consulted for admission for status epilepticus and airway protection in setting of post-ictal sedation. Type of Study: Bedside swallow evaluation Previous Swallow Assessment: none in chart Diet Prior to this Study: NPO Temperature Spikes Noted: No Respiratory Status: Room air History  of Recent Intubation: No Behavior/Cognition: Alert;Cooperative;Pleasant mood Oral Cavity - Dentition: Missing dentition Self-Feeding Abilities: Able to feed self;Total assist (but mits could not be removed (pulled 3 IVs)) Patient Positioning: Upright in bed Baseline Vocal Quality: Clear Volitional Cough: Strong Volitional Swallow: Able to elicit    Oral/Motor/Sensory Function Overall Oral Motor/Sensory Function: Appears within functional limits for tasks assessed   Ice Chips     Thin Liquid Thin Liquid: Within functional limits Presentation: Cup;Straw    Nectar Thick Nectar Thick Liquid: Not tested   Honey Thick Honey Thick Liquid: Not tested   Puree Puree: Within functional limits   Solid   GO    Solid: Within functional limits      [facility], [place] CCC-SLP [phone]  Liliahna Cudd, Katherene Ponto 09/09/2013,9:55 AM

## 2013-09-09 NOTE — Progress Notes (Signed)
Critical chest CT results called to oncall MD, Dr Alva Garnet. Dorna Bloom, RN

## 2013-09-09 NOTE — Progress Notes (Signed)
Spoke with Dr Armida Sans regarding patient unable to be still in MRI. Per him, it is ok to not get MRI because he will not give more mediation at this time. Per MRI and radiologist they were able to obtain images needed. Dorna Bloom, RN

## 2013-09-10 ENCOUNTER — Other Ambulatory Visit: Payer: Self-pay | Admitting: Pulmonary Disease

## 2013-09-10 DIAGNOSIS — R918 Other nonspecific abnormal finding of lung field: Secondary | ICD-10-CM

## 2013-09-10 DIAGNOSIS — C3490 Malignant neoplasm of unspecified part of unspecified bronchus or lung: Secondary | ICD-10-CM | POA: Diagnosis present

## 2013-09-10 DIAGNOSIS — R222 Localized swelling, mass and lump, trunk: Secondary | ICD-10-CM

## 2013-09-10 LAB — BASIC METABOLIC PANEL
BUN: 17 mg/dL (ref 6–23)
CHLORIDE: 104 meq/L (ref 96–112)
CO2: 21 meq/L (ref 19–32)
CREATININE: 0.9 mg/dL (ref 0.50–1.35)
Calcium: 9.3 mg/dL (ref 8.4–10.5)
GFR calc Af Amer: >90 mL/min (ref >90)
GFR calc non Af Amer: 88 mL/min — ABNORMAL LOW (ref >90)
Glucose, Bld: 87 mg/dL (ref 70–99)
Potassium: 3.6 mEq/L — ABNORMAL LOW (ref 3.7–5.3)
Sodium: 143 mEq/L (ref 137–147)

## 2013-09-10 LAB — CBC
HCT: 45.2 % (ref 39.0–52.0)
HEMOGLOBIN: 15 g/dL (ref 13.0–17.0)
MCH: 30.2 pg (ref 26.0–34.0)
MCHC: 33.2 g/dL (ref 30.0–36.0)
MCV: 90.9 fL (ref 78.0–100.0)
Platelets: 135 10*3/uL — ABNORMAL LOW (ref 150–400)
RBC: 4.97 MIL/uL (ref 4.22–5.81)
RDW: 13.4 % (ref 11.5–15.5)
WBC: 9.1 10*3/uL (ref 4.0–10.5)

## 2013-09-10 MED ORDER — BISACODYL 5 MG PO TBEC
10.0000 mg | DELAYED_RELEASE_TABLET | Freq: Every day | ORAL | Status: DC | PRN
  Filled 2013-09-10: qty 2

## 2013-09-10 MED ORDER — LEVETIRACETAM 750 MG PO TABS: 750.0000 mg | ORAL_TABLET | Freq: Two times a day (BID) | ORAL | Status: DC

## 2013-09-10 MED ORDER — LORAZEPAM 1 MG PO TABS: 2.0000 mg | ORAL_TABLET | Freq: Every day | ORAL | Status: DC

## 2013-09-10 MED ORDER — DONEPEZIL HCL 10 MG PO TABS
10.0000 mg | ORAL_TABLET | Freq: Every day | ORAL | Status: DC
  Filled 2013-09-10: qty 1

## 2013-09-10 MED ORDER — PANTOPRAZOLE SODIUM 40 MG PO TBEC
40.0000 mg | DELAYED_RELEASE_TABLET | Freq: Every day | ORAL | Status: DC
  Administered 2013-09-10: 40 mg via ORAL
  Filled 2013-09-10: qty 1

## 2013-09-10 MED ORDER — LEVETIRACETAM 750 MG PO TABS
750.0000 mg | ORAL_TABLET | Freq: Two times a day (BID) | ORAL | Status: DC
  Administered 2013-09-10: 750 mg via ORAL
  Filled 2013-09-10 (×2): qty 1

## 2013-09-10 MED ORDER — RISPERIDONE 3 MG PO TABS
3.0000 mg | ORAL_TABLET | Freq: Every day | ORAL | Status: DC
  Filled 2013-09-10: qty 1

## 2013-09-10 NOTE — Evaluation (Signed)
Physical Therapy Evaluation Patient Details Name: Marco [name] MRN: [id number] DOB: [birth year] Today's Date: 09/10/2013 Time: 7989-2119 PT Time Calculation (min): 15 min  PT Assessment / Plan / Recommendation History of Present Illness  Marco [name] is a 63 yo male with history significant for mental handicap, tobacco abuse, and recent onset seizure x1 in April 2014. Per his [place], Marco [name] was in his normal state of health without any further seizures since the single episode this past April until ~ 4 am in the morning on day of admission when he awoke to use the restroom and had a tonic-clonic seizure. EMS was not called at the time but he subsequently had another seizure shortly thereafter. He reportedly hit his head during both of the episodes and was noted to have blood on the back of his head. EMS was called and he had 2 additional seizures while in the ambulance and was given IV Versed. He had 3 more in the ED without regaining consciousness with a total of 6 mg of Ativan administered. CT of Head in ED was largely without acute abnormality other than posterior scalp hematoma and paranasal sinus thickening.Critical Care was consulted for admission for status epilepticus and airway protection in setting of post-ictal sedation. 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  Clinical Impression  Pt admitted with above. Pt currently with functional limitations due to the deficits listed below (see PT Problem List).  Pt will benefit from skilled PT to increase their independence and safety with mobility to allow discharge to the venue listed below. Caregiver reports that he is near his baseline with cognition, but not with mobility and functional tasks.  He is unsteady on his feet when standing, and especially unsteady with ambulation.  If his strength doesn't improve, I recommended to the caregiver, who is in  agreement, that home health PT should come out to the house to work on improving his balance and strength so that he can return to being independent with mobility.     PT Assessment  Patient needs continued PT services    Follow Up Recommendations  Home health PT;Supervision/Assistance - 24 hour          Equipment Recommendations  None recommended by PT       Frequency Min 3X/week    Precautions / Restrictions Precautions Precautions: Fall Precaution Comments: Unsteady on feet  Restrictions Weight Bearing Restrictions: No   Pertinent Vitals/Pain No pain reported.  No SOB with ambulation.      Mobility  Ambulation/Gait Ambulation Distance (Feet): 150 Feet General Gait Details: He is frequently staggering during ambulation.  He leans to his left onto PT during ambulation, and is unsafe to ambulate on his own at this time due to him presenting as a high fall risk. Number of Stairs: 10   09/10/13 1100  Bed Mobility  Overal bed mobility Modified Independent  General bed mobility comments Increased time required  Transfers  Overall transfer level Modified independent  Equipment used None  General transfer comment Increased time required, supervision required as he is unsteady          PT Diagnosis: Difficulty walking;Generalized weakness  PT Problem List: Decreased strength;Decreased activity tolerance;Decreased balance;Decreased mobility;Decreased coordination PT Treatment Interventions: Gait training;Functional mobility training;Stair training;Therapeutic exercise;Therapeutic activities;Neuromuscular re-education;Patient/family education     PT Goals(Current goals can be found in the care plan section) Acute Rehab PT Goals Patient Stated Goal: get better PT Goal Formulation:  With patient/family Time For Goal Achievement: 09/24/13 Potential to Achieve Goals: Good  Visit Information  Last PT Received On: 09/10/13 Assistance Needed: +1 Reason Eval/Treat Not  Completed: Patient at procedure or test/unavailable History of Present Illness: Marco [name] is a 63 yo male with history significant for mental hadicap, tobacco abuse, and recent onset seizure x1 in April 2014. Per his [place], Marco [name] was in his normal state of health without any further seizures since the single episode this past April until ~ 4 am in the morning on day of admission when he awoke to use the restroom and had a tonic-clonic seizure. EMS was not called at the time but he subsequently had another seizure shortly thereafter. He reportedly hit his head during both of the episodes and was noted to have blood on the back of his head. EMS was called and he had 2 additional seizures while in the ambulance and was given IV Versed. He had 3 more in the ED without regaining consciousness with a total of 6 mg of Ativan administered. CT of Head in ED was largely without acute abnormality other than posterior scalp hematoma and paranasal sinus thickening.Critical Care was consulted for admission for status epilepticus and airway protection in setting of post-ictal sedation. 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       Prior [place] expects to be discharged to:: Group home Additional Comments: caregivers present at home, he needs to climb 1 flight of stairs.  Previously independent with ambulation and mobility. Prior Function Level of Independence: Needs assistance Gait / Transfers Assistance Needed: was previously independent ADL's / Homemaking Assistance Needed: helps with chores around the house Communication Communication: Expressive difficulties    Cognition  Cognition Arousal/Alertness: Awake/alert Behavior During Therapy: [facility] for tasks assessed/performed Overall Cognitive Status: History of cognitive impairments - at baseline    Extremity/Trunk Assessment Upper  Extremity Assessment Upper Extremity Assessment: Defer to OT evaluation Lower Extremity Assessment Lower Extremity Assessment: Generalized weakness     09/10/13 1100  Balance  Standing balance comment Mod A required for balance.  He is unsteady on his feet and requires support.     End of Session PT - End of Session Equipment Utilized During Treatment: Gait belt Activity Tolerance: Patient tolerated treatment well Patient left: with call bell/phone within reach;in bed;with family/visitor present Nurse Communication: Mobility status  GP    Cordelia Poche, SPT Pager:  [place] 09/10/2013, 11:54 AM  Read, reviewed, edited and agree with student's findings and recommendations.  Barbarann Ehlers Frank, [place], DPT [phone]

## 2013-09-10 NOTE — Care Management Note (Signed)
    Page 1 of 1   09/10/2013     4:34:24 PM   CARE MANAGEMENT NOTE 09/10/2013  Patient:  Marco [name], Marco [name]   Account Number:  [id number]  Date Initiated:  09/10/2013  Documentation initiated by:  GRAVES-BIGELOW,Abas Leicht  Subjective/Objective Assessment:   Pt admitted for seizures and plans to return home to [place].     Action/Plan:   Plans for [facility] services via [place]. Referral made.   Anticipated DC Date:  09/10/2013   Anticipated DC Plan:  [place]  CM consult      [facility] Choice  HOME HEALTH   Choice offered to / List presented to:             Status of service:  Completed, signed off Medicare Important Message given?   (If response is "NO", the following Medicare IM given date fields will be blank) Date Medicare IM given:   Date Additional Medicare IM given:    Discharge Disposition:  [place]  Per UR Regulation:  Reviewed for med. necessity/level of care/duration of stay  If discussed at [place] of Stay Meetings, dates discussed:    Comments:

## 2013-09-10 NOTE — Progress Notes (Signed)
NEURO HOSPITALIST PROGRESS NOTE   SUBJECTIVE:                                                                                                                        Patient is back to his baseline per daughter.  No further seizures.   OBJECTIVE:                                                                                                                           Vital signs in last 24 hours: Temp:  [96.6 F (35.9 C)-97.9 F (36.6 C)] 96.6 F (35.9 C) (01/07 0512) Pulse Rate:  [80-87] 80 (01/07 0512) Resp:  [15-18] 18 (01/07 0512) BP: (131-134)/(67-80) 134/80 mmHg (01/07 0512) SpO2:  [94 %-97 %] 97 % (01/07 0512) Weight:  [66.543 kg (146 lb 11.2 oz)] 66.543 kg (146 lb 11.2 oz) (01/07 0512)  Intake/Output from previous day: 01/06 0701 - 01/07 0700 In: 1255 [P.O.:840; I.V.:200; IV Piggyback:215] Out: 50 [Urine:50] Intake/Output this shift: Total I/O In: 320 [P.O.:320] Out: 200 [Urine:200] Nutritional status: General  Past Medical History  Diagnosis Date  . GERD (gastroesophageal reflux disease)   . Seizures   . Developmental disability     developmentaly delayed     Neurologic Exam:   Mental Status: Alert, oriented.  Speech fluent without evidence of aphasia.  Able to follow simple commands without difficulty ( at baseline he is developmentally delayed. Cranial Nerves: II: Visual fields grossly normal, pupils equal, round, reactive to light and accommodation III,IV, VI: ptosis not present, extra-ocular motions intact bilaterally V,VII: smile symmetric, facial light touch sensation normal bilaterally VIII: hearing normal bilaterally IX,X: gag reflex present XI: bilateral shoulder shrug XII: midline tongue extension without atrophy or fasciculations  Motor: Right : Upper extremity   5/5    Left:     Upper extremity   5/5  Lower extremity   5/5     Lower extremity   5/5 Tone and bulk:normal tone throughout; no atrophy  noted Sensory: Pinprick and light touch intact throughout, bilaterally Deep Tendon Reflexes:  Right: Upper Extremity   Left: Upper extremity   biceps (C-5 to C-6) 2/4   biceps (C-5 to C-6) 2/4 tricep (C7) 2/4    triceps (  C7) 2/4 Brachioradialis (C6) 2/4  Brachioradialis (C6) 2/4  Lower Extremity Lower Extremity  quadriceps (L-2 to L-4) 2/4   quadriceps (L-2 to L-4) 2/4 Achilles (S1) 2/4   Achilles (S1) 2/4    Lab Results: No results found for this basename: cbc, bmp, coags, chol, tri, ldl, hga1c   Lipid Panel No results found for this basename: CHOL, TRIG, HDL, CHOLHDL, VLDL, LDLCALC,  in the last 72 hours  Studies/Results: Ct Chest W Contrast  09/09/2013   CLINICAL DATA:  Hilar prominence on chest radiograph. Recent seizure.  EXAM: CT CHEST WITH CONTRAST  TECHNIQUE: Multidetector CT imaging of the chest was performed during intravenous contrast administration.  CONTRAST:  79mL OMNIPAQUE IOHEXOL 300 MG/ML  SOLN  COMPARISON:  None  FINDINGS: Lungs/Pleura: Mild degradation by motion and patient arm position. Obstruction of anterior upper lobe segmental bronchus. Other airways patent.  A subpleural right lower lobe 4 mm nodule on image 30 is favored to represent a subpleural lymph node.  Patchy dependent bibasilar airspace disease, slightly greater on the right than left.  Central left upper lobe lung mass which measures 3.0 x 3.1 cm on image 24/series 3. 3.0 cm on coronal image 30. Intimately associated with the left pulmonary artery, including on image 25 of series 2.  No pleural fluid.  Heart/Mediastinum: No supraclavicular adenopathy. Mild cardiomegaly, without pericardial effusion. No mediastinal or right hilar adenopathy. An AP window 7 mm node on image 21 is equivocal but suspicious based on location.  Mildly dilated esophagus with air-fluid level within. Example image 40.  Upper Abdomen: Arm position degradation continuing into the abdomen. Variant lateral segment left liver lobe extending  into the left upper quadrant. Probable hepatic steatosis.  Bones/Musculoskeletal:  No acute osseous abnormality.  IMPRESSION: 1. 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 Intimately associated with the left pulmonary artery, for which involvement cannot be excluded. These results will be called to the ordering clinician or representative by the Radiologist Assistant, and communication documented in the PACS Dashboard. 2. A 7 mm lateral AP window node is not pathologic by size criteria but mildly suspicious based on location. Otherwise, no evidence of nodal metastasis. 3. Esophageal air fluid level suggests dysmotility or gastroesophageal reflux. 4. Mild degradation by motion and patient arm position. 5. Probable atelectasis in the dependent lower lobes. On the right, mild infection or aspiration could look similar.   Electronically Signed   By: Abigail Miyamoto M.D.   On: 09/09/2013 18:12   Mr Brain Wo Contrast  09/09/2013   CLINICAL DATA:  Seizures. The patient could only tolerate the diffusion weighted sequence.  EXAM: MRI HEAD WITHOUT CONTRAST  TECHNIQUE: Multiplanar, multiecho pulse sequences of the brain and surrounding structures were obtained without intravenous contrast.  COMPARISON:  CT head without contrast 1/5/ 15  FINDINGS: Restricted diffusion is noted within the right hippocampus. This corresponds with the history of seizures. This is most likely result of recent seizure activity. Ischemia to this area as a cause of seizures is also considered.  IMPRESSION: 1. Restricted diffusion within the right hippocampus. This corresponds with patient's seizures and likely reflects recent seizure activity. Acute ischemia is considered less likely.   Electronically Signed   By: Lawrence Santiago M.D.   On: 09/09/2013 16:58   Dg Chest Port 1 View  09/08/2013   CLINICAL DATA:  Shortness of breath.  Follow-up hilar prominence.  EXAM: PORTABLE CHEST - 1 VIEW  COMPARISON:   09/07/2013. Because of  now merged separate accounts, the patient has older prior studies which are now available for comparison, dated 04/07/2013 and 02/04/2011.  FINDINGS: 1125 hr. There are lower lung volumes with patchy perihilar and basilar pulmonary opacities bilaterally. As suggested on yesterday's study, there is a possible new left hilar mass. No significant pleural effusion is identified. The heart size is normal. Telemetry leads overlie the chest.  IMPRESSION: 1. New perihilar pulmonary opacities bilaterally may reflect atelectasis, edema or early inflammation. 2. Persistent concern of left hilar mass. Chest CT is recommended for further evaluation, ideally with contrast once the patient's acute pulmonary process has resolved.   Electronically Signed   By: Camie Patience M.D.   On: 09/08/2013 12:44    MEDICATIONS                                                                                                                        Scheduled: . heparin  5,000 Units Subcutaneous Q8H  . levETIRAcetam  750 mg Oral BID  . pantoprazole  40 mg Oral Daily    ASSESSMENT/PLAN:                                                                                                            63 y/o with h/o isolated seizure last April, now presenting to ED with GTC SE. Patient shows no further seizures while on Keppra 750 mg BID PO. He is back to his baseline per daughter. EEG shows no epileptiform activity .  Recommend Continue current dose of Keppra and follow up with outpatient neurologist at Discharge.  Neurology S/O.    Assessment and plan discussed with with attending physician and they are in agreement.    Etta Quill PA-C [facility] [phone]  09/10/2013, 10:25 AM

## 2013-09-10 NOTE — Progress Notes (Signed)
CSW (Clinical Education officer, museum) spoke with pt caregiver in room to confirm what paperwork will be required from [facility] at discharge. Per caregiver, pt will need FL2 and dc paperwork. CSW informed pt nurse that if pt does dc today please provide caregiver with signed copy of FL2 on located on shadow chart along with dc instructions. Pt nurse agreed. Pt caregiver to provide transport. At this time, no further social work needs. CSW signing off.  [place], [place]

## 2013-09-10 NOTE — Progress Notes (Signed)
Name: Marco [name] MRN: [id number] DOB: March 31, 1951    LOS: 3   ADMISSION DATE: 09/07/2013   CONSULTATION DATE: 09/07/2013   REFERRING MD : Neurology/Camillo  PRIMARY SERVICE: PCCM   CHIEF COMPLAINT: Seizures  BRIEF: 63 y/o male with status epilepticus.  First seizure in April 2014.  Hx of mental retardation.    SIGNIFICANT EVENTS: 1/4 Admit, neurology consulted  STUDIES: 1/4 Head CT >>> Mild posterior occipital scalp hematoma, no fracture; Mild ethmoid paranasal sinus disease. 1/5 EEG >>> normal 1/6 CT Chest>>>central LUL mass c/w primary bronchogenic carcinoma, intimately associated with pulmonary artery, 7 mm node in lateral AP window mildly suspicious based on location, no other concern for nodal metastasis  Cultures: 1/4 BCx >>>   Subjective: No acute events overnight. O2 weaned off. CT results as above  Vital Signs: Temp:  [96.6 F (35.9 C)-97.9 F (36.6 C)] 96.6 F (35.9 C) (01/07 0512) Pulse Rate:  [80-87] 80 (01/07 0512) Resp:  [18] 18 (01/07 0512) BP: (131-134)/(67-80) 134/80 mmHg (01/07 0512) SpO2:  [94 %-97 %] 97 % (01/07 0512) Weight:  [146 lb 11.2 oz (66.543 kg)] 146 lb 11.2 oz (66.543 kg) (01/07 0512) I/O last 3 completed shifts: In: 1962.5 [P.O.:840; I.V.:800; IV Piggyback:322.5] Out: 525 [Urine:525] Room air  Physical Examination: General: no distress  Neuro: Alert, follows simple commands, moves all extremities HEENT: Pupils reactive, no sinus tenderness Cardiovascular: regular, no murmur Lungs: no wheeze Abdomen: soft, non tender Musculoskeletal: no edema Skin:  No rashes  Labs: CBC Recent Labs     09/08/13  0415  09/09/13  0415  09/10/13  0551  WBC  15.9*  12.2*  9.1  HGB  15.2  15.5  15.0  HCT  45.4  46.1  45.2  PLT  129*  115*  135*   BMET Recent Labs     09/08/13  0415  09/09/13  0415  09/10/13  0551  NA  144  143  143  K  3.6*  4.1  3.6*  CL  102  103  104  CO2  28  26  21   BUN  14  17  17   CREATININE  0.85  0.89   0.90  GLUCOSE  97  89  87    Electrolytes Recent Labs     09/07/13  1300  09/08/13  0415  09/09/13  0415  09/10/13  0551  CALCIUM   --   8.6  8.6  9.3  MG  2.1   --   2.4   --   PHOS   --    --   2.2*   --    Liver Enzymes Recent Labs     09/09/13  0415  AST  56*  ALT  27  ALKPHOS  64  BILITOT  0.7  ALBUMIN  3.1*    Cardiac Enzymes Recent Labs     09/07/13  1300  09/07/13  1602  09/07/13  2217  TROPONINI  <0.30  <0.30  <0.30    Glucose Recent Labs     09/07/13  2346  09/08/13  0456  GLUCAP  123*  105*    Imaging Ct Chest W Contrast  09/09/2013   CLINICAL DATA:  Hilar prominence on chest radiograph. Recent seizure.  EXAM: CT CHEST WITH CONTRAST  TECHNIQUE: Multidetector CT imaging of the chest was performed during intravenous contrast administration.  CONTRAST:  2mL OMNIPAQUE IOHEXOL 300 MG/ML  SOLN  COMPARISON:  None  FINDINGS: Lungs/Pleura: Mild degradation by  motion and patient arm position. Obstruction of anterior upper lobe segmental bronchus. Other airways patent.  A subpleural right lower lobe 4 mm nodule on image 30 is favored to represent a subpleural lymph node.  Patchy dependent bibasilar airspace disease, slightly greater on the right than left.  Central left upper lobe lung mass which measures 3.0 x 3.1 cm on image 24/series 3. 3.0 cm on coronal image 30. Intimately associated with the left pulmonary artery, including on image 25 of series 2.  No pleural fluid.  Heart/Mediastinum: No supraclavicular adenopathy. Mild cardiomegaly, without pericardial effusion. No mediastinal or right hilar adenopathy. An AP window 7 mm node on image 21 is equivocal but suspicious based on location.  Mildly dilated esophagus with air-fluid level within. Example image 40.  Upper Abdomen: Arm position degradation continuing into the abdomen. Variant lateral segment left liver lobe extending into the left upper quadrant. Probable hepatic steatosis.  Bones/Musculoskeletal:  No acute  osseous abnormality.  IMPRESSION: 1. 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 Intimately associated with the left pulmonary artery, for which involvement cannot be excluded. These results will be called to the ordering clinician or representative by the Radiologist Assistant, and communication documented in the PACS Dashboard. 2. A 7 mm lateral AP window node is not pathologic by size criteria but mildly suspicious based on location. Otherwise, no evidence of nodal metastasis. 3. Esophageal air fluid level suggests dysmotility or gastroesophageal reflux. 4. Mild degradation by motion and patient arm position. 5. Probable atelectasis in the dependent lower lobes. On the right, mild infection or aspiration could look similar.   Electronically Signed   By: Abigail Miyamoto M.D.   On: 09/09/2013 18:12   Mr Brain Wo Contrast  09/09/2013   CLINICAL DATA:  Seizures. The patient could only tolerate the diffusion weighted sequence.  EXAM: MRI HEAD WITHOUT CONTRAST  TECHNIQUE: Multiplanar, multiecho pulse sequences of the brain and surrounding structures were obtained without intravenous contrast.  COMPARISON:  CT head without contrast 1/5/ 15  FINDINGS: Restricted diffusion is noted within the right hippocampus. This corresponds with the history of seizures. This is most likely result of recent seizure activity. Ischemia to this area as a cause of seizures is also considered.  IMPRESSION: 1. Restricted diffusion within the right hippocampus. This corresponds with patient's seizures and likely reflects recent seizure activity. Acute ischemia is considered less likely.   Electronically Signed   By: Lawrence Santiago M.D.   On: 09/09/2013 16:58   Dg Chest Port 1 View  09/08/2013   CLINICAL DATA:  Shortness of breath.  Follow-up hilar prominence.  EXAM: PORTABLE CHEST - 1 VIEW  COMPARISON:  09/07/2013. Because of now merged separate accounts, the patient has older prior studies which  are now available for comparison, dated 04/07/2013 and 02/04/2011.  FINDINGS: 1125 hr. There are lower lung volumes with patchy perihilar and basilar pulmonary opacities bilaterally. As suggested on yesterday's study, there is a possible new left hilar mass. No significant pleural effusion is identified. The heart size is normal. Telemetry leads overlie the chest.  IMPRESSION: 1. New perihilar pulmonary opacities bilaterally may reflect atelectasis, edema or early inflammation. 2. Persistent concern of left hilar mass. Chest CT is recommended for further evaluation, ideally with contrast once the patient's acute pulmonary process has resolved.   Electronically Signed   By: Camie Patience M.D.   On: 09/08/2013 12:44    ASSESSMENT AND PLAN  NEUROLOGIC A:   Status epilepticus  with hx of seizure disorder >> mental status improved 1/6. Hx of mental retardation. P: -AED's per Neurology -f/u MRI -outpt aricept, risperdal, trazodone mgmt per [facility]  PULMONARY A:   ?hilar prominence of CXR 1/4 >> persistent on CXR 1/5. Hx of tobacco abuse. Allergic rhinitis. LUL Lung Mass P: -oxygen as needed to keep SpO2 > 92% -monitor for aspiration -may need to avoid anti-histamines in setting of seizures >> was on zyrtec as outpt  STAFF NOTE  - get SUPER D CD and give to dR Byrum - NP wil do it - we will get through out office opd PET Scan - NP will coordinate with office [facility] - after above, see Dr Lamonte Sakai in office next few weeks   GASTROINTESTINAL A:   Nutrition. Dysphagia. Hx of GERD. P: -NPO -Protonix for SUP >> uses omeprazole as outpt -speech to assess swallowing  HEMATOLOGIC A:   Mildly thrombocytopenic. P: -f/u CBC -SQ heparin for DVT prevention  [name], NP-C [facility] Pgr: [phone] or [phone]

## 2013-09-10 NOTE — Discharge Instructions (Signed)
Seizure, Adult A seizure is abnormal electrical activity in the brain. Seizures can cause a change in attention or behavior (altered mental status). Seizures often involve uncontrollable shaking (convulsions). Seizures usually last from 30 seconds to 2 minutes. Epilepsy is a brain disorder in which a patient has repeated seizures over time. CAUSES  There are many different problems that can cause seizures. In some cases, no cause is found. Common causes of seizures include:  Head injuries.  Brain tumors.  Infections.  Imbalance of chemicals in the blood.  Kidney failure or liver failure.  Heart disease.  Drug abuse.  Stroke.  Withdrawal from certain drugs or alcohol.  Birth defects.  Malfunction of a neurosurgical device placed in the brain. SYMPTOMS  Symptoms vary depending on the part of the brain that is involved. Right before a seizure, you may have a warning (aura) that a seizure is about to occur. An aura may include the following symptoms:   Fear or anxiety.  Nausea.  Feeling like the room is spinning (vertigo).  Vision changes, such as seeing flashing lights or spots. Common symptoms during a seizure include:  Convulsions.  Drooling.  Rapid eye movements.  Grunting.  Loss of bladder and bowel control.  Bitter taste in the mouth. After a seizure, you may feel confused and sleepy. You may also have an injury resulting from convulsions during the seizure. DIAGNOSIS  Your caregiver will perform a physical exam and run some tests to determine the type and cause of your seizure. These tests may include:  Blood tests.  A lumbar puncture test. In this test, a small amount of fluid is removed from the spine and examined.  Electrocardiography (ECG). This test records the electrical activity in your heart.  Imaging tests, such as computed tomography (CT) scans or magnetic resonance imaging (MRI).  Electroencephalography (EEG). This test records the electrical  activity in your brain. TREATMENT  Seizures usually stop on their own. Treatment will depend on the cause of your seizure. In some cases, medicine may be given to prevent future seizures. HOME CARE INSTRUCTIONS   If you are given medicines, take them exactly as prescribed by your caregiver.  Keep all follow-up appointments as directed by your caregiver.  Do not swim or drive until your caregiver says it is okay.  Teach friends and family what to do if you have a seizure. They should:  Lay you on the ground to prevent a fall.  Put a cushion under your head.  Loosen any tight clothing around your neck.  Turn you on your side. If vomiting occurs, this helps keep your airway clear.  Stay with you until you recover. SEEK IMMEDIATE MEDICAL CARE IF:  The seizure lasts longer than 2 to 5 minutes.  The seizure is severe or the person does not wake up after the seizure.  The person has altered mental status. Drive the person to the emergency department or call your local emergency services (911 in [place] MAKE SURE YOU:  Understand these instructions.  Will watch your condition.  Will get help right away if you are not doing well or get worse. Document Released: 08/18/2000 Document Revised: 11/13/2011 Document Reviewed: 08/09/2011 [facility] Patient Information 2014 [place], [place].

## 2013-09-10 NOTE — Progress Notes (Signed)
Chart reviewed. Patient examined.  Patient is stable from a seizure perspective, CAT scan shows left upper lobe lung mass, possibly primary bronchogenic carcinoma, involving pulmonary artery. Has been weaned off oxygen. Will discuss Plans with pulmonary medicine. Paged. Await call back.  Doree Barthel, M.D. [facility] [phone]

## 2013-09-10 NOTE — Evaluation (Signed)
Occupational Therapy Evaluation Patient Details Name: Marco [name] MRN: [id number] DOB: 08-18-1951 Today's Date: 09/10/2013 Time: 2542-7062 OT Time Calculation (min): 19 min  OT Assessment / Plan / Recommendation History of present illness Marco [name] is a 63 yo male with history significant for mental retardation, tobacco abuse, and recent onset seizure x1 in April 2014. Per his [facility], Marco [name] was in his normal state of health without any further seizures since the single episode this past April until ~ 4 am in the morning on day of admission when he awoke to use the restroom and had a tonic-clonic seizure. EMS was not called at the time but he subsequently had another seizure shortly thereafter. He reportedly hit his head during both of the episodes and was noted to have blood on the back of his head. EMS was called and he had 2 additional seizures while in the ambulance and was given IV Versed. He had 3 more in the ED without regaining consciousness with a total of 6 mg of Ativan administered. CT of Head in ED was largely without acute abnormality other than posterior scalp hematoma and paranasal sinus thickening.Critical Care was consulted for admission for status epilepticus and airway protection in setting of post-ictal sedation. 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   Clinical Impression   Pt is at set up/sup - min guard A level with ADLs and ADL mobility. Pt requires sup.min guard A with mobility due to decreased balance. NO further acute OT services indicated and  to continue with acute PT services to address functional mobility/balance safety    OT Assessment  Patient does not need any further OT services    Follow Up Recommendations  No OT follow up    Barriers to Discharge  none    Equipment Recommendations  None recommended by OT    Recommendations for Other Services    Frequency        Precautions / Restrictions Precautions Precautions: Fall Precaution Comments: Unsteady on feet  Restrictions Weight Bearing Restrictions: No   Pertinent Vitals/Pain No c/o pain    ADL  Grooming: Performed;Wash/dry hands;Wash/dry face;Supervision/safety;Min guard;Set up Upper Body Bathing: Simulated;Supervision/safety;Set up Where Assessed - Upper Body Bathing: Unsupported sitting Lower Body Bathing: Simulated;Supervision/safety;Set up;Min guard Where Assessed - Lower Body Bathing: Supported sit to stand;Unsupported sitting Upper Body Dressing: Performed;Supervision/safety;Set up Where Assessed - Upper Body Dressing: Unsupported sitting Lower Body Dressing: Performed;Supervision/safety;Set up;Min guard Where Assessed - Lower Body Dressing: Unsupported sitting;Supported sit to stand Toilet Transfer: Performed;Supervision/safety;Min guard Toilet Transfer Method: Sit to Loss adjuster, chartered: Regular height toilet;Grab bars Toileting - Clothing Manipulation and Hygiene: Performed;Supervision/safety Where Assessed - Best boy and Hygiene: Standing Tub/Shower Transfer: Performed;Min Gaffer: Walk in shower;Grab bars Equipment Used: Gait belt Transfers/Ambulation Related to ADLs: requires sup - min guard assist due to bieng unsteady. Min - md A for balance/support ADL Comments: sup - min guard A due to unsteadiness    OT Diagnosis:    OT Problem List:   OT Treatment Interventions:     OT Goals(Current goals can be found in the care plan section) Acute Rehab OT Goals Patient Stated Goal: get better  Visit Information  Last OT Received On: 09/10/13 Assistance Needed: +1 History of Present Illness: Marco [name] is a 63 yo male with history significant for mental retardation, tobacco abuse, and recent onset seizure x1 in April 2014. Per his [place], Mr.  Ernsberger was in his normal state of health without any  further seizures since the single episode this past April until ~ 4 am in the morning on day of admission when he awoke to use the restroom and had a tonic-clonic seizure. EMS was not called at the time but he subsequently had another seizure shortly thereafter. He reportedly hit his head during both of the episodes and was noted to have blood on the back of his head. EMS was called and he had 2 additional seizures while in the ambulance and was given IV Versed. He had 3 more in the ED without regaining consciousness with a total of 6 mg of Ativan administered. CT of Head in ED was largely without acute abnormality other than posterior scalp hematoma and paranasal sinus thickening.Critical Care was consulted for admission for status epilepticus and airway protection in setting of post-ictal sedation. 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       Prior [place] expects to be discharged to:: Group home Additional Comments: caregivers present at home, he needs to climb 1 flight of stairs.  Previously independent with ambulation and mobility. Prior Function Level of Independence: Needs assistance Gait / Transfers Assistance Needed: was previously independent ADL's / Homemaking Assistance Needed: helps with chores around the house Communication Communication: Expressive difficulties Dominant Hand: Right         Vision/Perception Vision - History Patient Visual Report: No change from baseline Perception Perception: Within Functional Limits   Cognition  Cognition Arousal/Alertness: Awake/alert Behavior During Therapy: WFL for tasks assessed/performed Overall Cognitive Status: History of cognitive impairments - at baseline    Extremity/Trunk Assessment Upper Extremity Assessment Upper Extremity Assessment: Overall WFL for tasks assessed Lower Extremity Assessment Lower Extremity  Assessment: Defer to PT evaluation Cervical / Trunk Assessment Cervical / Trunk Assessment: Normal     Mobility            Balance  min - mod A for balance/support during dynamic standing and ADL mobility   End of Session OT - End of Session Equipment Utilized During Treatment: Gait belt Activity Tolerance: Patient tolerated treatment well Patient left: in bed;with call bell/phone within reach;with bed alarm set;with family/visitor present  GO     Britt Bottom 09/10/2013, 1:02 PM

## 2013-09-10 NOTE — Discharge Summary (Signed)
Staff   Dr. Brand Males, M.D., F.C.C.P Pulmonary and Critical Care Medicine Staff Physician [facility] Pager: [phone], If no answer or between  15:00h - 7:00h: call [phone]  09/10/2013 11:51 PM

## 2013-09-10 NOTE — Discharge Summary (Signed)
Physician Discharge Summary  Patient ID: Marco [name] MRN: [id number] DOB/AGE: [birth year] 65 y.o.  Admit date: 09/07/2013 Discharge date: 09/10/2013    Discharge Diagnoses:  Status epilepticus with hx of seizure disorder >> mental status improved 1/6.  Hx of mental retardation. LUL Lung Mass  Hilar prominence of CXR 1/4 >> persistent on CXR 1/5, CT obtained with mass as above.  Hx of tobacco abuse.  Allergic rhinitis.                                                                Dysphagia.  Hx of GERD.       Thrombocytopenia                                                                              DISCHARGE PLAN BY DIAGNOSIS      Status epilepticus with hx of seizure disorder >> mental status improved 1/6.  Hx of mental retardation.   Discharge Plan: -continue Keppra at 750 mg BID -f/u with [facility] -resume outpt aricept, risperdal, trazodone   LUL Lung Mass  Hilar prominence of CXR 1/4 >> persistent on CXR 1/5, CT obtained with mass as above.  Hx of tobacco abuse.  Allergic rhinitis.   Discharge Plan: - avoid anti-histamines in setting of seizures >> was on zyrtec as outpt.  Can use OTC nasocort - SUPER D images obtained  - outpatient PET Scan arranged for prior to appt with Dr. Lamonte Sakai - Follow up Dr Lamonte Sakai as scheduled for further planning   Dysphagia.  Hx of GERD.   Discharge Plan: -Protonix for SUP >> uses omeprazole as outpt  -no swallowing needs identified.  Regular diet / thin liquids per SLP   Mildly thrombocytopenic.   Discharge Plan:  -f/u CBC PRN                   DISCHARGE SUMMARY   Marco [name] is a 63 y.o. y/o male with a PMH of mental retardation, lives in a group home and new diagnosis of seizures in 12/2012 (on keppra) who presented to [facility] ER on 1/4 after attempting to use the restroom and had a tonic-clonic seizure. EMS was not called at the time but he subsequently had another seizure shortly thereafter. He  reportedly hit his head during both of the episodes and was noted to have blood on the back of his head. EMS was called and he had 2 additional seizures while in the ambulance and was given IV Versed. He had 3 more in the ED without regaining consciousness with a total of 6 mg of Ativan administered. CT of Head in ED was largely without acute abnormality other than posterior scalp hematoma and paranasal sinus thickening. Group home staff reported he had not missed medication doses prior to presentation. Patient was admitted to ICU for status epilepticus and airway protection in setting of post-ictal sedation.  He underwent on EEG on 1/5 which was normal.  He was  evaluated by Neurology and was recommended to continue current dose of Keppra and follow up with outpatient Neurology.  Patient's initial CXR demonstrated prominence of the left hilar structures.  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  Patient made quick improvement and wanted to discharge home.  Plan for further mass work up as above.              SIGNIFICANT EVENTS:  1/4 Admit, neurology consulted   STUDIES:  1/4 Head CT >>> Mild posterior occipital scalp hematoma, no fracture; Mild ethmoid paranasal sinus disease.  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, intimately associated with pulmonary artery, 7 mm node in lateral AP window mildly suspicious based on location, no other concern for nodal metastasis   Cultures:  1/4 BCx >>>  Discharge Exam: General: no distress  Neuro: Alert, follows simple commands, moves all extremities  HEENT: Pupils reactive, no sinus tenderness  Cardiovascular: regular, no murmur  Lungs: no wheeze  Abdomen: soft, non tender   Musculoskeletal: no edema  Skin: No rashes   Filed Vitals:   09/09/13 1000 09/09/13 1100 09/09/13 2026 09/10/13 0512  BP: 133/88 131/80 131/67 134/80  Pulse: 84  87 80  Temp:  97.6 F (36.4 C) 97.9 F (36.6 C) 96.6 F (35.9 C)  TempSrc:  Axillary Oral Oral  Resp: _0 Height:      Weight:    146 lb 11.2 oz (66.543 kg)  SpO2: 100% 97% 94% 97%     Discharge Labs  BMET  Recent Labs Lab 09/07/13 0900 09/07/13 0904 09/07/13 1300 09/08/13 0415 09/09/13 0415 09/10/13 0551  NA 143 142  --  144 143 143  K 3.7 3.6*  --  3.6* 4.1 3.6*  CL 98 100  --  102 103 104  CO2 19  --   --  _1 GLUCOSE 206* 214*  --  97 89 87  BUN 17 18  --  _2 CREATININE 1.23 1.40*  --  0.85 0.89 0.90  CALCIUM 8.7  --   --  8.6 8.6 9.3  MG  --   --  2.1  --  2.4  --   PHOS  --   --   --   --  2.2*  --     CBC  Recent Labs Lab 09/08/13 0415 09/09/13 0415 09/10/13 0551  HGB 15.2 15.5 15.0  HCT 45.4 46.1 45.2  WBC 15.9* 12.2* 9.1  PLT 129* 115* 135*    Discharge Orders   Future Appointments Provider Department Dept Phone   09/25/2013 11:30 AM Collene Gobble, MD [facility] [phone]   10/06/2013 8:00 AM Adam Melvern Sample, DO [facility] [phone]   Future Orders Complete By Expires   Call MD for:  difficulty breathing, headache or visual disturbances  As directed    Call MD for:  severe uncontrolled pain  As directed    Call MD for:  temperature >100.4  As directed    Diet - low sodium heart healthy  As directed    Discharge instructions  As directed    Comments:     1. Stop taking zyrtec, may use OTC nasocort for allergy symptoms 2.  Follow up with Dr. Lamonte Sakai in Pulmonary office 3. Office will call you with PET scan appointment   Increase activity slowly  As directed            Follow-up Information   Follow up with [facility] On 10/06/2013. (Appt at 7:45 - hospital follow up for seizures)    Contact information:   [address] [place] [address] [phone]      Follow up with Collene Gobble., MD On 09/25/2013. (Appt at 11:30 AM.  Pulmonary office at [facility] building across the road from [facility])    Specialty:  Pulmonary Disease   Contact information:   [address] [address] [phone]       Follow up with PET Scan . (Office will call you regarding outpatient PET scan.)          Medication List    STOP taking these medications       cetirizine 10 MG tablet  Commonly known as:  ZYRTEC      TAKE these medications       bisacodyl 5 MG EC tablet  Commonly known as:  DULCOLAX  Take 10 mg by mouth daily as needed for mild constipation or moderate constipation.     donepezil 10 MG tablet  Commonly known as:  ARICEPT  Take 10 mg by mouth at bedtime.     levETIRAcetam 750 MG tablet  Commonly known as:  KEPPRA  Take 1 tablet (750 mg total) by mouth 2 (two) times daily.     LORazepam 1 MG tablet  Commonly known as:  ATIVAN  Take 2 mg by mouth at bedtime.     omeprazole 20 MG capsule  Commonly known as:  PRILOSEC  Take 20 mg by mouth 2 (two) times daily.     risperiDONE 3 MG tablet  Commonly known as:  RISPERDAL  Take 3 mg by mouth at bedtime.     traZODone 50 MG tablet  Commonly known as:  DESYREL  Take 25 mg by mouth at bedtime.     VITAMIN D PO  Take 5,000 Units by mouth once a week. Saturdays        Disposition: Group Home / No new home health needs identified prior to discharge.    Discharged Condition: Marco [name] has met maximum benefit of inpatient care and is medically stable and cleared for discharge.  Patient is pending follow up as above.      Time spent on disposition:  Greater than 35 minutes.   Signed: [name], NP-C [facility] Pgr: [phone] Office: [phone]

## 2013-09-10 NOTE — Progress Notes (Signed)
Reviewed discharge instructions with patient's caregiver, and she stated her understanding.  Discharged home via wheelchair with caregivers.  FL2 giving to caregiver.  Sanda Linger

## 2013-09-11 ENCOUNTER — Telehealth: Payer: Self-pay | Admitting: Neurology

## 2013-09-11 ENCOUNTER — Encounter: Payer: Self-pay | Admitting: Neurology

## 2013-09-12 NOTE — Telephone Encounter (Signed)
Hi. Can you please call him back and ask him to bring Marco [name] in at 3:30 on January 14 for follow up. Thanks.

## 2013-09-12 NOTE — Telephone Encounter (Signed)
Please advise 

## 2013-09-13 LAB — CULTURE, BLOOD (ROUTINE X 2)
Culture: NO GROWTH
Culture: NO GROWTH

## 2013-09-15 NOTE — ED Provider Notes (Signed)
CSN: [id number]     Arrival date & time 09/07/13  3875 History   First MD Initiated Contact with Patient 09/07/13 [phone]     Chief Complaint  Patient presents with  . Seizures  . Fall    Patient is a 63 y.o. male presenting with seizures and fall.  Seizures Fall   Received pt from [facility] at [address] with c/o seizure 1st at 0400, but EMS was not called at that time. Pt had another seizure at Group home and EMS was called. Pt had 3 seizures while with EMS. Pt Given 2.5 of versed by EMS x 2. Pt fell during seizure at group home. [facility]G 111 for EMS. Pt pulse ox dropped to 60% after last seizure.Marland Kitchen  He was started on keppra 500 mg BID at ht time of his first seizure in April and there is not reported concerns about compliance. No recent fever, infection, sleep deprivation, or medical illnesses.  The group home staff indicated that this morning around 4 am he woke up to go to he bathroom and fell to the floor and had a GTC seizure. He regained consciousness few later oh had another seizure and EMS was called.  According to EMS he sustained 2 more seizure on route to the ED and received IV versed. Further seizures x 3 in he ED without regaining consciousness in between seizures. Total of 6 mg of IV ativan administered in the ED and no further clinical seizures ever since.  WBC 14,000. Electrolytes unremarkable. U/a pending.   Past Medical History  Diagnosis Date  . Mental disorder     sczizophrenia;moderate retardation  . Depression   . DEMENTIA   . Hypertension     no medications, no documented history per caregiver at preadmission  . GERD (gastroesophageal reflux disease)   . Seizures   . Developmental disability     developmentaly delayed   Past Surgical History  Procedure Laterality Date  . Cataract extraction w/phaco  07/12/2011    Procedure: CATARACT EXTRACTION PHACO AND INTRAOCULAR LENS PLACEMENT (IOC);  Surgeon: Adonis Brook;  Location: [facility] OR;  Service: Ophthalmology;   Laterality: Left;  Marland Kitchen Eye surgery      L eye   History reviewed. No pertinent family history. History  Substance Use Topics  . Smoking status: Current Every Day Smoker -- 0.50 packs/day for 35 years    Types: Cigarettes  . Smokeless tobacco: Not on file  . Alcohol Use: No    Review of Systems  Unable to perform ROS: Acuity of condition  Neurological: Positive for seizures.    Allergies  Review of patient's allergies indicates no known allergies.  Home Medications   Current Outpatient Rx  Name  Route  Sig  Dispense  Refill  . bisacodyl (DULCOLAX) 5 MG EC tablet   Oral   Take 10 mg by mouth daily as needed for mild constipation or moderate constipation.         . Cholecalciferol (VITAMIN D PO)   Oral   Take 5,000 Units by mouth once a week. Saturdays         . donepezil (ARICEPT) 10 MG tablet   Oral   Take 10 mg by mouth at bedtime.         Marland Kitchen LORazepam (ATIVAN) 1 MG tablet   Oral   Take 2 mg by mouth at bedtime.         Marland Kitchen omeprazole (PRILOSEC) 20 MG capsule   Oral   Take  20 mg by mouth 2 (two) times daily.         . risperiDONE (RISPERDAL) 3 MG tablet   Oral   Take 3 mg by mouth at bedtime.         . traZODone (DESYREL) 50 MG tablet   Oral   Take 25 mg by mouth at bedtime.         . bisacodyl (DULCOLAX) 5 MG EC tablet   Oral   Take 10 mg by mouth daily as needed for constipation.          Marland Kitchen buPROPion (WELLBUTRIN XL) 300 MG 24 hr tablet   Oral   Take 300 mg by mouth daily.          . cetirizine (ZYRTEC) 10 MG tablet   Oral   Take 10 mg by mouth daily.          . Cholecalciferol 5000 UNITS capsule   Oral   Take 5,000 Units by mouth every 7 (seven) days.         Marland Kitchen donepezil (ARICEPT) 10 MG tablet   Oral   Take 10 mg by mouth at bedtime.          . levETIRAcetam (KEPPRA) 500 MG tablet   Oral   Take 1 tablet (500 mg total) by mouth 2 (two) times daily.   60 tablet   6   . levETIRAcetam (KEPPRA) 750 MG tablet   Oral    Take 1 tablet (750 mg total) by mouth 2 (two) times daily.   60 tablet   3   . LORazepam (ATIVAN) 1 MG tablet   Oral   Take 2 mg by mouth at bedtime. For sleep         . omeprazole (PRILOSEC) 20 MG capsule   Oral   Take 20 mg by mouth 2 (two) times daily.          . risperiDONE (RISPERDAL) 3 MG tablet   Oral   Take 3 mg by mouth at bedtime.         . traZODone (DESYREL) 50 MG tablet   Oral   Take 25 mg by mouth at bedtime as needed for sleep.          BP 139/84  Pulse 86  Temp(Src) 97.2 F (36.2 C) (Oral)  Resp 18  Ht 5\' 8"  (1.727 m)  Wt 146 lb 11.2 oz (66.543 kg)  BMI 22.31 kg/m2  SpO2 99% Physical Exam  Nursing note and vitals reviewed. Constitutional: He appears well-developed and well-nourished. He is uncooperative.  HENT:  Head: Normocephalic and atraumatic.  Eyes: Pupils are equal, round, and reactive to light.  Neck: Normal range of motion.  Cardiovascular: Normal rate and intact distal pulses.   Pulmonary/Chest: No respiratory distress.  Abdominal: Normal appearance. He exhibits no distension.  Musculoskeletal: Normal range of motion.  Neurological: He is unresponsive. No cranial nerve deficit. GCS eye subscore is 2. GCS verbal subscore is 3. GCS motor subscore is 4.  Skin: Skin is warm and dry. No rash noted.  Psychiatric: He has a normal mood and affect.    ED Course  Procedures (including critical care time) Labs Review Labs Reviewed  COMPREHENSIVE METABOLIC PANEL - Abnormal; Notable for the following:    Glucose, Bld 206 (*)    GFR calc non Af Amer 60 (*)    GFR calc Af Amer 70 (*)    All other components within normal limits  URINALYSIS, ROUTINE W REFLEX MICROSCOPIC -  Abnormal; Notable for the following:    APPearance TURBID (*)    Glucose, UA 250 (*)    Hgb urine dipstick MODERATE (*)    Protein, ur 30 (*)    All other components within normal limits  CBC WITH DIFFERENTIAL - Abnormal; Notable for the following:    WBC 14.0 (*)     Platelets 147 (*)    Neutrophils Relative % 87 (*)    Neutro Abs 12.2 (*)    Lymphocytes Relative 4 (*)    Lymphs Abs 0.6 (*)    Monocytes Absolute 1.3 (*)    All other components within normal limits  CBC - Abnormal; Notable for the following:    WBC 15.0 (*)    Platelets 121 (*)    All other components within normal limits  URINE MICROSCOPIC-ADD ON - Abnormal; Notable for the following:    Crystals URIC ACID CRYSTALS (*)    All other components within normal limits  BASIC METABOLIC PANEL - Abnormal; Notable for the following:    Potassium 3.6 (*)    All other components within normal limits  CBC - Abnormal; Notable for the following:    WBC 15.9 (*)    Platelets 129 (*)    All other components within normal limits  GLUCOSE, CAPILLARY - Abnormal; Notable for the following:    Glucose-Capillary 123 (*)    All other components within normal limits  GLUCOSE, CAPILLARY - Abnormal; Notable for the following:    Glucose-Capillary 105 (*)    All other components within normal limits  CBC - Abnormal; Notable for the following:    WBC 12.2 (*)    Platelets 115 (*)    All other components within normal limits  COMPREHENSIVE METABOLIC PANEL - Abnormal; Notable for the following:    Albumin 3.1 (*)    AST 56 (*)    GFR calc non Af Amer 88 (*)    All other components within normal limits  PHOSPHORUS - Abnormal; Notable for the following:    Phosphorus 2.2 (*)    All other components within normal limits  BASIC METABOLIC PANEL - Abnormal; Notable for the following:    Potassium 3.6 (*)    GFR calc non Af Amer 88 (*)    All other components within normal limits  CBC - Abnormal; Notable for the following:    Platelets 135 (*)    All other components within normal limits  POCT I-STAT, CHEM 8 - Abnormal; Notable for the following:    Potassium 3.6 (*)    Creatinine, Ser 1.40 (*)    Glucose, Bld 214 (*)    All other components within normal limits  CULTURE, BLOOD (ROUTINE X 2)   CULTURE, BLOOD (ROUTINE X 2)  MRSA PCR SCREENING  MAGNESIUM  TROPONIN I  TROPONIN I  TROPONIN I  MAGNESIUM  LEVETIRACETAM LEVEL   Imaging Review No results found.  EKG Interpretation    Date/Time:  Sunday September 07 2013 08:55:02 EST Ventricular Rate:  150 PR Interval:  154 QRS Duration: 97 QT Interval:  329 QTC Calculation: 520 R Axis:   5 Text Interpretation:  Sinus tachycardia Ventricular premature complex Aberrant conduction of SV complex(es) Low voltage, extremity leads ST depression, probably rate related Prolonged QT interval Confirmed by Shanzay Hepworth  MD, Liam Cammarata ([id number]) on 09/07/2013 10:27:19 AM            CRITICAL CARE Performed by: Leonard Schwartz L Total critical care time: 30 minutes Critical care time was exclusive  of separately billable procedures and treating other patients. Critical care was necessary to treat or prevent imminent or life-threatening deterioration. Critical care was time spent personally by me on the following activities: development of treatment plan with patient and/or surrogate as well as nursing, discussions with consultants, evaluation of patient's response to treatment, examination of patient, obtaining history from patient or surrogate, ordering and performing treatments and interventions, ordering and review of laboratory studies, ordering and review of radiographic studies, pulse oximetry and re-evaluation of patient's condition.   MDM   1. Status epilepticus   2. Mental retardation   3. Tobacco abuse   4. Abnormal CXR   5. GERD (gastroesophageal reflux disease)   6. Seizure disorder   7. Lung mass   8. Constipation        Dot Lanes, MD 09/15/13 1529

## 2013-09-22 ENCOUNTER — Ambulatory Visit (HOSPITAL_COMMUNITY)
Admission: RE | Admit: 2013-09-22 | Discharge: 2013-09-22 | Disposition: A | Discharge status: Home / Self Care | Payer: Medicaid Other | Source: Ambulatory Visit | Attending: Pulmonary Disease | Admitting: Pulmonary Disease

## 2013-09-22 DIAGNOSIS — R918 Other nonspecific abnormal finding of lung field: Secondary | ICD-10-CM

## 2013-09-22 DIAGNOSIS — R222 Localized swelling, mass and lump, trunk: Secondary | ICD-10-CM | POA: Insufficient documentation

## 2013-09-22 LAB — GLUCOSE, CAPILLARY: Glucose-Capillary: 96 mg/dL (ref 70–99)

## 2013-09-22 MED ORDER — FLUDEOXYGLUCOSE F - 18 (FDG) INJECTION
15.9000 | Freq: Once | INTRAVENOUS | Status: AC | PRN
  Administered 2013-09-22: 15.9 via INTRAVENOUS

## 2013-09-22 NOTE — Telephone Encounter (Signed)
Called patient  Per Dr. Janann Colonel to see if they would like to be seen 09-29-13 or 09-30-13 or did they want to keep their appointment on 10-14-13. No answer phone just rang and no answer.

## 2013-09-25 ENCOUNTER — Encounter: Payer: Self-pay | Admitting: Emergency Medicine

## 2013-09-25 ENCOUNTER — Telehealth: Payer: Self-pay | Admitting: Emergency Medicine

## 2013-09-25 ENCOUNTER — Ambulatory Visit (INDEPENDENT_AMBULATORY_CARE_PROVIDER_SITE_OTHER): Payer: Medicaid Other | Admitting: Emergency Medicine

## 2013-09-25 VITALS — BP 118/76 | HR 89 | Ht 68.0 in | Wt 152.2 lb

## 2013-09-25 DIAGNOSIS — R918 Other nonspecific abnormal finding of lung field: Secondary | ICD-10-CM

## 2013-09-25 DIAGNOSIS — R222 Localized swelling, mass and lump, trunk: Secondary | ICD-10-CM

## 2013-09-25 NOTE — Assessment & Plan Note (Signed)
PET scan is consistent with malignancy. I discussed the findings with his HCPOA today, the risks and benefits of biopsy. She and the patient's sister (Guardian) are interested in possible treatment. We decided to proceed with bronchoscopy to achieve pathology dx. Will work on setting this up

## 2013-09-25 NOTE — Patient Instructions (Signed)
We will work on setting up a bronchoscopy for biopsy of your lung.  Follow with Dr Lamonte Sakai in 1 month

## 2013-09-25 NOTE — Telephone Encounter (Signed)
error 

## 2013-09-25 NOTE — Progress Notes (Signed)
Subjective:    Patient ID: Marco [name], male    DOB: 07-28-1951, 63 y.o.   MRN: [id number]  HPI 63 yo man, former smoker (18pk-yrs), hx seizures, mental retardation, admitted for status epilepticus from 1/4-09/10/13. That hospitalization showed a central LUL mass. He has been discharged to home and underwent PET  > shows that the lesion is hypermetabolic. He presents today to discuss biopsy.    Review of Systems  Constitutional: Negative for fever and unexpected weight change.  HENT: Positive for congestion, postnasal drip and sinus pressure. Negative for dental problem, ear pain, nosebleeds, rhinorrhea, sneezing, sore throat and trouble swallowing.   Eyes: Negative for redness and itching.  Respiratory: Positive for cough and shortness of breath. Negative for chest tightness and wheezing.   Cardiovascular: Negative for palpitations and leg swelling.  Gastrointestinal: Negative for nausea and vomiting.  Genitourinary: Negative for dysuria.  Musculoskeletal: Negative for joint swelling.  Skin: Negative for rash.  Neurological: Negative for headaches.  Hematological: Does not bruise/bleed easily.  Psychiatric/Behavioral: Negative for dysphoric mood. The patient is not nervous/anxious.    Past Medical History  Diagnosis Date  . Mental disorder     sczizophrenia;moderate retardation  . Depression   . DEMENTIA   . Hypertension     no medications, no documented history per caregiver at preadmission  . GERD (gastroesophageal reflux disease)   . Seizures   . Developmental disability     developmentaly delayed     No family history on file.   History   Social History  . Marital Status: Single    Spouse Name: N/A    Number of Children: N/A  . Years of Education: N/A   Occupational History  . Not on file.   Social History Main Topics  . Smoking status: Former Smoker -- 0.50 packs/day for 35 years    Types: Cigarettes    Quit date: 09/07/2013  . Smokeless tobacco: Not on  file  . Alcohol Use: No  . Drug Use: No  . Sexual Activity: No   Other Topics Concern  . Not on file   Social History Narrative   ** Merged History Encounter **       Patient lives in a group with 3 other brothers.    Patient does not work.   Patient is single.    Patient has some HS education.    Patient has no children.    Patient has a history of psychotic disorder NOS and mental retardation.      No Known Allergies   Outpatient Prescriptions Prior to Visit  Medication Sig Dispense Refill  . bisacodyl (DULCOLAX) 5 MG EC tablet Take 10 mg by mouth daily as needed for constipation.       Marland Kitchen buPROPion (WELLBUTRIN XL) 300 MG 24 hr tablet Take 300 mg by mouth daily.       . cetirizine (ZYRTEC) 10 MG tablet Take 10 mg by mouth daily.       . Cholecalciferol (VITAMIN D PO) Take 5,000 Units by mouth once a week. Saturdays      . Cholecalciferol 5000 UNITS capsule Take 5,000 Units by mouth every 7 (seven) days.      Marland Kitchen donepezil (ARICEPT) 10 MG tablet Take 10 mg by mouth at bedtime.      . levETIRAcetam (KEPPRA) 500 MG tablet Take 1 tablet (500 mg total) by mouth 2 (two) times daily.  60 tablet  6  . LORazepam (ATIVAN) 1 MG tablet Take 2  mg by mouth at bedtime.      Marland Kitchen omeprazole (PRILOSEC) 20 MG capsule Take 20 mg by mouth 2 (two) times daily.      . risperiDONE (RISPERDAL) 3 MG tablet Take 3 mg by mouth at bedtime.      . traZODone (DESYREL) 50 MG tablet Take 25 mg by mouth at bedtime.      Marland Kitchen LORazepam (ATIVAN) 1 MG tablet Take 2 mg by mouth at bedtime. For sleep      . bisacodyl (DULCOLAX) 5 MG EC tablet Take 10 mg by mouth daily as needed for mild constipation or moderate constipation.      Marland Kitchen donepezil (ARICEPT) 10 MG tablet Take 10 mg by mouth at bedtime.       . levETIRAcetam (KEPPRA) 750 MG tablet Take 1 tablet (750 mg total) by mouth 2 (two) times daily.  60 tablet  3  . omeprazole (PRILOSEC) 20 MG capsule Take 20 mg by mouth 2 (two) times daily.       . risperiDONE (RISPERDAL)  3 MG tablet Take 3 mg by mouth at bedtime.      . traZODone (DESYREL) 50 MG tablet Take 25 mg by mouth at bedtime as needed for sleep.       No facility-administered medications prior to visit.        Objective:   Physical Exam Filed Vitals:   09/25/13 1145  BP: 118/76  Pulse: 89  Height: 5\' 8"  (1.727 m)  Weight: 152 lb 3.2 oz (69.037 kg)  SpO2: 100%   Gen: Pleasant, well-nourished, in no distress, developmental delay  ENT: Poor dentition,  mouth clear,  oropharynx clear, no postnasal drip  Neck: No JVD, no TMG, no carotid bruits  Lungs: No use of accessory muscles,  clear without rales or rhonchi  Cardiovascular: RRR, heart sounds normal, no murmur or gallops, no peripheral edema  Musculoskeletal: No deformities, no cyanosis or clubbing  Neuro: alert, non focal  Skin: Warm, no lesions or rashes   PET scan 09/22/13 COMPARISON: Chest x-ray 09/08/2013.  FINDINGS:  NECK  No hypermetabolic lymph nodes in the neck.  CHEST  Suspected left-sided lung mass on recent chest radiograph  corresponds to a macrolobulated 4.3 x 3.0 cm hypermetabolic (SUVmax  = 13.0)QMVH upper lobe mass (image 82 of series 2). This is  intimately associated with the left hilum such that left hilar  lymphadenopathy and hypermetabolism cannot be excluded. No definite  pathologically enlarged or hypermetabolic mediastinal or right hilar  lymph nodes are noted. There is a cluster of peribronchovascular  micronodularity in the periphery of the right upper lobe best  demonstrated on image 85 of series 2, which corresponds with some  low-level metabolic activity in the periphery of the right upper  lobe on the PET portion of the examination (SUVmax = 2.8),  presumably infectious or inflammatory. No other definite suspicious  appearing pulmonary nodules or masses are noted. No acute  consolidative airspace disease. No pleural effusions.  ABDOMEN/PELVIS  No abnormal hypermetabolic activity within the  liver, pancreas,  adrenal glands, or spleen. No hypermetabolic lymph nodes in the  abdomen or pelvis. 1.7 cm low-attenuation lesion lateral aspect of  the interpolar region of the right kidney is incompletely  characterized on today's CT examination, favored to represent a  small cyst. No significant volume of ascites. No pneumoperitoneum.  No pathologic distention of small bowel. Normal appendix. Prostate  gland and urinary bladder are unremarkable in appearance.  SKELETON  Adjacent to the  inferior aspect of the right scapula there is a  focus of increased metabolic activity (SUVmax = 4.1), without a  correlate on the CT portion of the examination. This may be  physiologic or related to recent muscular strain. No focal  hypermetabolic activity to suggest skeletal metastasis.  IMPRESSION:  1. 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 No definite evidence to suggest metastatic  disease to the neck, abdomen or pelvis. These findings are favored  to represent either T2a, N0, Mx disease, or T2a, N1, Mx disease  (i.e., either stage IA or IIA disease). Surgical consultation and  correlation with biopsy is recommended.  2. Small cluster of peribronchovascular micronodules in the  periphery of the right upper lobe with low level hypermetabolism in  this region, presumably of infectious or inflammatory etiology.  Attention on followup studies is recommended to ensure resolution.  3. Small focus of hypermetabolic activity adjacent to the inferior  aspect of the right scapula, without definite correlate on the CT  portion of the examination. This is nonspecific, but favored to be  benign, either physiologic or related to muscular strain.  4. Additional incidental findings, as above.      Assessment & Plan:  Lung mass PET scan is consistent with malignancy. I discussed the  findings with his HCPOA today, the risks and benefits of biopsy. She and the patient's sister (Guardian) are interested in possible treatment. We decided to proceed with bronchoscopy to achieve pathology dx. Will work on setting this up

## 2013-09-26 ENCOUNTER — Telehealth: Payer: Self-pay | Admitting: Emergency Medicine

## 2013-09-26 NOTE — Telephone Encounter (Signed)
I spoke with Marco [name] She confirmed pt BX was scheduled for 10/01/13. I advised her the resp dept will call the day before regarding information what to do/not to do. If she does not hear fromt hem the day before then to call us. Nothing further needed

## 2013-10-01 ENCOUNTER — Ambulatory Visit (HOSPITAL_COMMUNITY)
Admission: RE | Admit: 2013-10-01 | Discharge: 2013-10-01 | Disposition: A | Discharge status: Home / Self Care | Payer: Medicaid Other | Source: Ambulatory Visit | Attending: Emergency Medicine | Admitting: Emergency Medicine

## 2013-10-01 ENCOUNTER — Encounter (HOSPITAL_COMMUNITY)
Admission: RE | Disposition: A | Discharge status: Home / Self Care | Payer: Self-pay | Source: Ambulatory Visit | Attending: Emergency Medicine

## 2013-10-01 DIAGNOSIS — K219 Gastro-esophageal reflux disease without esophagitis: Secondary | ICD-10-CM | POA: Insufficient documentation

## 2013-10-01 DIAGNOSIS — R569 Unspecified convulsions: Secondary | ICD-10-CM | POA: Insufficient documentation

## 2013-10-01 DIAGNOSIS — Z87891 Personal history of nicotine dependence: Secondary | ICD-10-CM | POA: Insufficient documentation

## 2013-10-01 DIAGNOSIS — F209 Schizophrenia, unspecified: Secondary | ICD-10-CM | POA: Insufficient documentation

## 2013-10-01 DIAGNOSIS — F71 Moderate intellectual disabilities: Secondary | ICD-10-CM | POA: Insufficient documentation

## 2013-10-01 DIAGNOSIS — Z79899 Other long term (current) drug therapy: Secondary | ICD-10-CM | POA: Insufficient documentation

## 2013-10-01 DIAGNOSIS — C3410 Malignant neoplasm of upper lobe, unspecified bronchus or lung: Secondary | ICD-10-CM | POA: Insufficient documentation

## 2013-10-01 DIAGNOSIS — R222 Localized swelling, mass and lump, trunk: Secondary | ICD-10-CM

## 2013-10-01 DIAGNOSIS — R6250 Unspecified lack of expected normal physiological development in childhood: Secondary | ICD-10-CM | POA: Insufficient documentation

## 2013-10-01 DIAGNOSIS — F3289 Other specified depressive episodes: Secondary | ICD-10-CM | POA: Insufficient documentation

## 2013-10-01 DIAGNOSIS — I10 Essential (primary) hypertension: Secondary | ICD-10-CM | POA: Insufficient documentation

## 2013-10-01 DIAGNOSIS — F329 Major depressive disorder, single episode, unspecified: Secondary | ICD-10-CM | POA: Insufficient documentation

## 2013-10-01 DIAGNOSIS — F0390 Unspecified dementia without behavioral disturbance: Secondary | ICD-10-CM | POA: Insufficient documentation

## 2013-10-01 DIAGNOSIS — R918 Other nonspecific abnormal finding of lung field: Secondary | ICD-10-CM

## 2013-10-01 HISTORY — PX: VIDEO BRONCHOSCOPY: SHX5072

## 2013-10-01 SURGERY — VIDEO BRONCHOSCOPY WITHOUT FLUORO
Anesthesia: Moderate Sedation | Laterality: Bilateral

## 2013-10-01 MED ORDER — LIDOCAINE HCL (PF) 1 % IJ SOLN: INTRAMUSCULAR | Status: DC | PRN

## 2013-10-01 MED ORDER — MIDAZOLAM HCL 10 MG/2ML IJ SOLN
INTRAMUSCULAR | Status: DC | PRN
  Administered 2013-10-01: 2 mg via INTRAVENOUS
  Administered 2013-10-01: 3 mg via INTRAVENOUS
  Administered 2013-10-01: 2 mg via INTRAVENOUS
  Administered 2013-10-01: 1 mg via INTRAVENOUS

## 2013-10-01 MED ORDER — FENTANYL CITRATE 0.05 MG/ML IJ SOLN
INTRAMUSCULAR | Status: DC | PRN
  Administered 2013-10-01 (×4): 50 ug via INTRAVENOUS

## 2013-10-01 MED ORDER — MIDAZOLAM HCL 5 MG/ML IJ SOLN
INTRAMUSCULAR | Status: AC
  Filled 2013-10-01: qty 2

## 2013-10-01 MED ORDER — SODIUM CHLORIDE 0.9 % IV SOLN
INTRAVENOUS | Status: DC
  Administered 2013-10-01: 08:00:00 via INTRAVENOUS

## 2013-10-01 MED ORDER — FENTANYL CITRATE 0.05 MG/ML IJ SOLN
INTRAMUSCULAR | Status: AC
  Filled 2013-10-01: qty 4

## 2013-10-01 MED ORDER — LIDOCAINE HCL 1 % IJ SOLN
INTRAMUSCULAR | Status: DC | PRN
  Administered 2013-10-01: 6 mL via RESPIRATORY_TRACT

## 2013-10-01 MED ORDER — LIDOCAINE HCL 2 % EX GEL
CUTANEOUS | Status: DC | PRN
  Administered 2013-10-01: 1

## 2013-10-01 MED ORDER — PHENYLEPHRINE HCL 0.25 % NA SOLN
NASAL | Status: DC | PRN
  Administered 2013-10-01: 2 via NASAL

## 2013-10-01 NOTE — Op Note (Signed)
Video Bronchoscopy Procedure Note  Date of Operation: 10/01/2013  Pre-op Diagnosis: LUL mass  Post-op Diagnosis: Same  Surgeon: Baltazar Apo  Assistants: none  Anesthesia: conscious sedation, moderate sedation  Meds Given: fentanyl 239mcg, versed 8mg  in divided doses, epi 1:10000 dil 10cc total, 1% lidocaine 20cc total  Operation: Flexible video fiberoptic bronchoscopy and biopsies.  Estimated Blood Loss: 72ZD  Complications: none noted  Indications and History: Marco [name] is 63 y.o. with history of smoking. He was found to have a LUL mass concerning for primary lung cancer.  Recommendation was to perform video fiberoptic bronchoscopy with biopsies. The risks, benefits, complications, treatment options and expected outcomes were discussed with the patient.  The possibilities of pneumothorax, pneumonia, reaction to medication, pulmonary aspiration, perforation of a viscus, bleeding, failure to diagnose a condition and creating a complication requiring transfusion or operation were discussed with the patient who freely signed the consent.    Description of Procedure: The patient was seen in the Preoperative Area, was examined and was deemed appropriate to proceed.  The patient was taken to [facility], identified as Marco [name] and the procedure verified as Flexible Video Fiberoptic Bronchoscopy.  A Time Out was held and the above information confirmed.   Conscious sedation was initiated as indicated above. The video fiberoptic bronchoscope was introduced via the L nare and a general inspection was performed which showed normal cords, normal trachea, normal main carina. The R sided airways were inspected and showed normal RUL, RML and RLL. There was a slight protrusion of the lateral wall of the BI but no overt endobronchial lesion. The L side was then inspected. The LLL airways were normal. There was a dark friable lesion occluding a segment of the LUL.  1:10000 epi was injected  onto the lesion. Endobronchial brushings and endobronchial forceps biopsies were performed. There was some initial moderate bleeding that stopped quickly. The patient tolerated the procedure well. The bronchoscope was removed. There were no obvious complications.   Samples: 1. Endobronchial brushings from LUL 2. Endobronchial forceps biopsies from LUL  Plans:  We will review the cytology, pathology and microbiology results with the patient when they become available.  Outpatient followup will be with Dr Lamonte Sakai.    Baltazar Apo, MD, PhD 10/01/2013, 8:47 AM [facility] [phone] or if no answer [phone]

## 2013-10-01 NOTE — H&P (View-Only) (Signed)
Subjective:    Patient ID: Marco [name], male    DOB: October 04, 1950, 63 y.o.   MRN: [id number]  HPI 63 yo man, former smoker (18pk-yrs), hx seizures, mental retardation, admitted for status epilepticus from 1/4-09/10/13. That hospitalization showed a central LUL mass. He has been discharged to home and underwent PET  > shows that the lesion is hypermetabolic. He presents today to discuss biopsy.    Review of Systems  Constitutional: Negative for fever and unexpected weight change.  HENT: Positive for congestion, postnasal drip and sinus pressure. Negative for dental problem, ear pain, nosebleeds, rhinorrhea, sneezing, sore throat and trouble swallowing.   Eyes: Negative for redness and itching.  Respiratory: Positive for cough and shortness of breath. Negative for chest tightness and wheezing.   Cardiovascular: Negative for palpitations and leg swelling.  Gastrointestinal: Negative for nausea and vomiting.  Genitourinary: Negative for dysuria.  Musculoskeletal: Negative for joint swelling.  Skin: Negative for rash.  Neurological: Negative for headaches.  Hematological: Does not bruise/bleed easily.  Psychiatric/Behavioral: Negative for dysphoric mood. The patient is not nervous/anxious.    Past Medical History  Diagnosis Date  . Mental disorder     sczizophrenia;moderate retardation  . Depression   . DEMENTIA   . Hypertension     no medications, no documented history per caregiver at preadmission  . GERD (gastroesophageal reflux disease)   . Seizures   . Developmental disability     developmentaly delayed     No family history on file.   History   Social History  . Marital Status: Single    Spouse Name: N/A    Number of Children: N/A  . Years of Education: N/A   Occupational History  . Not on file.   Social History Main Topics  . Smoking status: Former Smoker -- 0.50 packs/day for 35 years    Types: Cigarettes    Quit date: 09/07/2013  . Smokeless tobacco: Not on  file  . Alcohol Use: No  . Drug Use: No  . Sexual Activity: No   Other Topics Concern  . Not on file   Social History Narrative   ** Merged History Encounter **       Patient lives in a group with 3 other brothers.    Patient does not work.   Patient is single.    Patient has some HS education.    Patient has no children.    Patient has a history of psychotic disorder NOS and mental retardation.      No Known Allergies   Outpatient Prescriptions Prior to Visit  Medication Sig Dispense Refill  . bisacodyl (DULCOLAX) 5 MG EC tablet Take 10 mg by mouth daily as needed for constipation.       Marland Kitchen buPROPion (WELLBUTRIN XL) 300 MG 24 hr tablet Take 300 mg by mouth daily.       . cetirizine (ZYRTEC) 10 MG tablet Take 10 mg by mouth daily.       . Cholecalciferol (VITAMIN D PO) Take 5,000 Units by mouth once a week. Saturdays      . Cholecalciferol 5000 UNITS capsule Take 5,000 Units by mouth every 7 (seven) days.      Marland Kitchen donepezil (ARICEPT) 10 MG tablet Take 10 mg by mouth at bedtime.      . levETIRAcetam (KEPPRA) 500 MG tablet Take 1 tablet (500 mg total) by mouth 2 (two) times daily.  60 tablet  6  . LORazepam (ATIVAN) 1 MG tablet Take 2  mg by mouth at bedtime.      Marland Kitchen omeprazole (PRILOSEC) 20 MG capsule Take 20 mg by mouth 2 (two) times daily.      . risperiDONE (RISPERDAL) 3 MG tablet Take 3 mg by mouth at bedtime.      . traZODone (DESYREL) 50 MG tablet Take 25 mg by mouth at bedtime.      Marland Kitchen LORazepam (ATIVAN) 1 MG tablet Take 2 mg by mouth at bedtime. For sleep      . bisacodyl (DULCOLAX) 5 MG EC tablet Take 10 mg by mouth daily as needed for mild constipation or moderate constipation.      Marland Kitchen donepezil (ARICEPT) 10 MG tablet Take 10 mg by mouth at bedtime.       . levETIRAcetam (KEPPRA) 750 MG tablet Take 1 tablet (750 mg total) by mouth 2 (two) times daily.  60 tablet  3  . omeprazole (PRILOSEC) 20 MG capsule Take 20 mg by mouth 2 (two) times daily.       . risperiDONE (RISPERDAL)  3 MG tablet Take 3 mg by mouth at bedtime.      . traZODone (DESYREL) 50 MG tablet Take 25 mg by mouth at bedtime as needed for sleep.       No facility-administered medications prior to visit.        Objective:   Physical Exam Filed Vitals:   09/25/13 1145  BP: 118/76  Pulse: 89  Height: 5\' 8"  (1.727 m)  Weight: 152 lb 3.2 oz (69.037 kg)  SpO2: 100%   Gen: Pleasant, well-nourished, in no distress, developmental delay  ENT: Poor dentition,  mouth clear,  oropharynx clear, no postnasal drip  Neck: No JVD, no TMG, no carotid bruits  Lungs: No use of accessory muscles,  clear without rales or rhonchi  Cardiovascular: RRR, heart sounds normal, no murmur or gallops, no peripheral edema  Musculoskeletal: No deformities, no cyanosis or clubbing  Neuro: alert, non focal  Skin: Warm, no lesions or rashes   PET scan 09/22/13 COMPARISON: Chest x-ray 09/08/2013.  FINDINGS:  NECK  No hypermetabolic lymph nodes in the neck.  CHEST  Suspected left-sided lung mass on recent chest radiograph  corresponds to a macrolobulated 4.3 x 3.0 cm hypermetabolic (SUVmax  = 17.6)HYWV upper lobe mass (image 82 of series 2). This is  intimately associated with the left hilum such that left hilar  lymphadenopathy and hypermetabolism cannot be excluded. No definite  pathologically enlarged or hypermetabolic mediastinal or right hilar  lymph nodes are noted. There is a cluster of peribronchovascular  micronodularity in the periphery of the right upper lobe best  demonstrated on image 85 of series 2, which corresponds with some  low-level metabolic activity in the periphery of the right upper  lobe on the PET portion of the examination (SUVmax = 2.8),  presumably infectious or inflammatory. No other definite suspicious  appearing pulmonary nodules or masses are noted. No acute  consolidative airspace disease. No pleural effusions.  ABDOMEN/PELVIS  No abnormal hypermetabolic activity within the  liver, pancreas,  adrenal glands, or spleen. No hypermetabolic lymph nodes in the  abdomen or pelvis. 1.7 cm low-attenuation lesion lateral aspect of  the interpolar region of the right kidney is incompletely  characterized on today's CT examination, favored to represent a  small cyst. No significant volume of ascites. No pneumoperitoneum.  No pathologic distention of small bowel. Normal appendix. Prostate  gland and urinary bladder are unremarkable in appearance.  SKELETON  Adjacent to the  inferior aspect of the right scapula there is a  focus of increased metabolic activity (SUVmax = 4.1), without a  correlate on the CT portion of the examination. This may be  physiologic or related to recent muscular strain. No focal  hypermetabolic activity to suggest skeletal metastasis.  IMPRESSION:  1. 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 No definite evidence to suggest metastatic  disease to the neck, abdomen or pelvis. These findings are favored  to represent either T2a, N0, Mx disease, or T2a, N1, Mx disease  (i.e., either stage IA or IIA disease). Surgical consultation and  correlation with biopsy is recommended.  2. Small cluster of peribronchovascular micronodules in the  periphery of the right upper lobe with low level hypermetabolism in  this region, presumably of infectious or inflammatory etiology.  Attention on followup studies is recommended to ensure resolution.  3. Small focus of hypermetabolic activity adjacent to the inferior  aspect of the right scapula, without definite correlate on the CT  portion of the examination. This is nonspecific, but favored to be  benign, either physiologic or related to muscular strain.  4. Additional incidental findings, as above.      Assessment & Plan:  Lung mass PET scan is consistent with malignancy. I discussed the  findings with his HCPOA today, the risks and benefits of biopsy. She and the patient's sister (Guardian) are interested in possible treatment. We decided to proceed with bronchoscopy to achieve pathology dx. Will work on setting this up

## 2013-10-01 NOTE — Interval H&P Note (Signed)
PCCM Interval Note  Pt presents for FOB today for LUL mass. No new problems or issues reported by him or his guardian. No barriers identified.   Filed Vitals:   10/01/13 0720 10/01/13 0725 10/01/13 0730 10/01/13 0735  BP: 136/85 135/92 129/87 129/95  Pulse: 84 83 79 87  Resp: 13 17 15 14   Height:      Weight:      SpO2: 100% 100% 100% 100%   Plan > will proceed with FOB and biopsies.   Baltazar Apo, MD, PhD 10/01/2013, 7:54 AM [facility] [phone] or if no answer [phone]

## 2013-10-01 NOTE — Discharge Instructions (Signed)
Flexible Bronchoscopy, Care After These instructions give you information on caring for yourself after your procedure. Your doctor may also give you more specific instructions. Call your doctor if you have any problems or questions after your procedure. HOME CARE  Do not eat or drink anything for 2 hours after your procedure. If you try to eat or drink before the medicine wears off, food or drink could go into your lungs. You could also burn yourself.  After 2 hours have passed and when you can cough and gag normally, you may eat soft food and drink liquids slowly.  The day after the test, you may eat your normal diet.  You may do your normal activities.  Keep all doctor visits. GET HELP RIGHT AWAY IF:  You get more and more short of breath.  You get lightheaded.  You feel like you are going to pass out (faint).  You have chest pain.  You have new problems that worry you.  You cough up more than a little blood.  You cough up more blood than before. MAKE SURE YOU:  Understand these instructions.  Will watch your condition.  Will get help right away if you are not doing well or get worse. Document Released: 06/18/2009 Document Revised: 06/11/2013 Document Reviewed: 04/25/2013 [facility] Patient Information 2014 [place].  Please call our office for any questions or concerns. [phone]. Flexible Bronchoscopy, Care After Refer to this sheet in the next few weeks. These instructions provide you with information on caring for yourself after your procedure. Your health care provider may also give you more specific instructions. Your treatment has been planned according to current medical practices, but problems sometimes occur. Call your health care provider if you have any problems or questions after your procedure.  WHAT TO EXPECT AFTER THE PROCEDURE It is normal to have the following symptoms for 24 48 hours after the procedure:   Increased cough.  Low-grade  fever.  Sore throat or hoarse voice.  Small streaks of blood in your thick spit (sputum), if tissue samples were taken (biopsy). HOME CARE INSTRUCTIONS   Do not eat or drink anything for 2 hours after your procedure. Your nose and throat were numbed by medicine. If you try to eat or drink before the medicine wears off, food or drink could go into your lungs or you could burn yourself. After the numbness is gone and your cough and gag reflexes have returned, you may eat soft food and drink liquids slowly.   The day after the procedure, you can go back to your normal diet.   You may resume normal activities.   Follow up with your health care provider as directed. It is important to keep all follow-up appointments, especially if tissue samples were taken for testing (biopsy). SEEK IMMEDIATE MEDICAL CARE IF:   You have increasing shortness of breath.   You become lightheaded or faint.   You have chest pain.   You have any new concerning symptoms.  You cough up more than a small amount of blood.  The amount of blood you cough up increases. MAKE SURE YOU:  Understand these instructions.  Will watch your condition.  Will get help right away if you are not doing well or get worse. Document Released: 03/10/2005 Document Revised: 06/11/2013 Document Reviewed: 04/25/2013 [facility] Patient Information 2014 [place].

## 2013-10-02 ENCOUNTER — Encounter (HOSPITAL_COMMUNITY): Payer: Self-pay | Admitting: Emergency Medicine

## 2013-10-06 ENCOUNTER — Telehealth: Payer: Self-pay | Admitting: Emergency Medicine

## 2013-10-06 ENCOUNTER — Ambulatory Visit: Payer: Medicaid Other | Admitting: Neurology

## 2013-10-06 NOTE — Telephone Encounter (Signed)
Spoke with [employer]. Made her aware RB will be in the office tomorrow. Please advise thanks

## 2013-10-06 NOTE — Telephone Encounter (Signed)
Discussed with her. Referring to [facility]

## 2013-10-10 ENCOUNTER — Ambulatory Visit (INDEPENDENT_AMBULATORY_CARE_PROVIDER_SITE_OTHER): Payer: Medicaid Other | Admitting: Emergency Medicine

## 2013-10-10 ENCOUNTER — Encounter: Payer: Self-pay | Admitting: Emergency Medicine

## 2013-10-10 VITALS — BP 130/84 | HR 82 | Ht 68.0 in | Wt 155.8 lb

## 2013-10-10 DIAGNOSIS — R222 Localized swelling, mass and lump, trunk: Secondary | ICD-10-CM

## 2013-10-10 DIAGNOSIS — R918 Other nonspecific abnormal finding of lung field: Secondary | ICD-10-CM

## 2013-10-10 NOTE — Assessment & Plan Note (Addendum)
-   I will refer to [place] - I am concerned he will not be able to do spirometry, but I suspect that he could tolerate sgy if felt he were a candidate based on staging eval - he will need an MRI, probably with sedation to be able to tolerate

## 2013-10-10 NOTE — Progress Notes (Signed)
Subjective:    Patient ID: Marco [name], male    DOB: [birth year], 63 y.o.   MRN: [id number]  HPI 63 yo man, former smoker (18pk-yrs), hx seizures, mental retardation, admitted for status epilepticus from 1/4-09/10/13. That hospitalization showed a central LUL mass. He has been discharged to home and underwent PET  > shows that the lesion is hypermetabolic. He presents today to discuss biopsy.   ROV 10/10/13 -- follows today after his FOB to check status and review path and plans to discuss therapy.  Unfortunately shows squamous cell lung CA. He was discussed in thoracic conference on 10/09/13 and it was recommended that he undergo a full MRI (probably with sedation) to get appropriate staging.    Review of Systems  Constitutional: Negative for fever and unexpected weight change.  HENT: Positive for congestion, postnasal drip and sinus pressure. Negative for dental problem, ear pain, nosebleeds, rhinorrhea, sneezing, sore throat and trouble swallowing.   Eyes: Negative for redness and itching.  Respiratory: Positive for cough and shortness of breath. Negative for chest tightness and wheezing.   Cardiovascular: Negative for palpitations and leg swelling.  Gastrointestinal: Negative for nausea and vomiting.  Genitourinary: Negative for dysuria.  Musculoskeletal: Negative for joint swelling.  Skin: Negative for rash.  Neurological: Negative for headaches.  Hematological: Does not bruise/bleed easily.  Psychiatric/Behavioral: Negative for dysphoric mood. The patient is not nervous/anxious.        Objective:   Physical Exam Filed Vitals:   10/10/13 1605  BP: 130/84  Pulse: 82  Height: 5\' 8"  (1.727 m)  Weight: 155 lb 12.8 oz (70.67 kg)  SpO2: 100%   Gen: Pleasant, well-nourished, in no distress, developmental delay  ENT: Poor dentition,  mouth clear,  oropharynx clear, no postnasal drip  Neck: No JVD, no TMG, no carotid bruits  Lungs: No use of accessory muscles,  clear without  rales or rhonchi  Cardiovascular: RRR, heart sounds normal, no murmur or gallops, no peripheral edema  Musculoskeletal: No deformities, no cyanosis or clubbing  Neuro: alert, non focal  Skin: Warm, no lesions or rashes   PET scan 09/22/13 COMPARISON: Chest x-ray 09/08/2013.  FINDINGS:  NECK  No hypermetabolic lymph nodes in the neck.  CHEST  Suspected left-sided lung mass on recent chest radiograph  corresponds to a macrolobulated 4.3 x 3.0 cm hypermetabolic (SUVmax  = 02.5)KYHC upper lobe mass (image 82 of series 2). This is  intimately associated with the left hilum such that left hilar  lymphadenopathy and hypermetabolism cannot be excluded. No definite  pathologically enlarged or hypermetabolic mediastinal or right hilar  lymph nodes are noted. There is a cluster of peribronchovascular  micronodularity in the periphery of the right upper lobe best  demonstrated on image 85 of series 2, which corresponds with some  low-level metabolic activity in the periphery of the right upper  lobe on the PET portion of the examination (SUVmax = 2.8),  presumably infectious or inflammatory. No other definite suspicious  appearing pulmonary nodules or masses are noted. No acute  consolidative airspace disease. No pleural effusions.  ABDOMEN/PELVIS  No abnormal hypermetabolic activity within the liver, pancreas,  adrenal glands, or spleen. No hypermetabolic lymph nodes in the  abdomen or pelvis. 1.7 cm low-attenuation lesion lateral aspect of  the interpolar region of the right kidney is incompletely  characterized on today's CT examination, favored to represent a  small cyst. No significant volume of ascites. No pneumoperitoneum.  No pathologic distention of small bowel. Normal appendix. Prostate  gland and urinary bladder are unremarkable in appearance.  SKELETON  Adjacent to the inferior aspect of the right scapula there is a  focus of increased metabolic activity (SUVmax = 4.1),  without a  correlate on the CT portion of the examination. This may be  physiologic or related to recent muscular strain. No focal  hypermetabolic activity to suggest skeletal metastasis.  IMPRESSION:  1. 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 No definite evidence to suggest metastatic  disease to the neck, abdomen or pelvis. These findings are favored  to represent either T2a, N0, Mx disease, or T2a, N1, Mx disease  (i.e., either stage IA or IIA disease). Surgical consultation and  correlation with biopsy is recommended.  2. Small cluster of peribronchovascular micronodules in the  periphery of the right upper lobe with low level hypermetabolism in  this region, presumably of infectious or inflammatory etiology.  Attention on followup studies is recommended to ensure resolution.  3. Small focus of hypermetabolic activity adjacent to the inferior  aspect of the right scapula, without definite correlate on the CT  portion of the examination. This is nonspecific, but favored to be  benign, either physiologic or related to muscular strain.  4. Additional incidental findings, as above.      Assessment & Plan:  Squamous cell lung cancer - I will refer to [facility] - I am concerned he will not be able to do spirometry, but I suspect that he could tolerate sgy if felt he were a candidate based on staging eval - he will need an MRI, probably with sedation to be able to tolerate

## 2013-10-13 ENCOUNTER — Telehealth: Payer: Self-pay | Admitting: *Deleted

## 2013-10-13 NOTE — Telephone Encounter (Signed)
Called with appt for mtoc 10/16/13.

## 2013-10-14 ENCOUNTER — Ambulatory Visit (INDEPENDENT_AMBULATORY_CARE_PROVIDER_SITE_OTHER): Payer: Medicaid Other | Admitting: Neurology

## 2013-10-14 ENCOUNTER — Encounter: Payer: Self-pay | Admitting: Neurology

## 2013-10-14 VITALS — BP 130/87 | HR 83 | Ht 69.0 in | Wt 156.0 lb

## 2013-10-14 DIAGNOSIS — R569 Unspecified convulsions: Secondary | ICD-10-CM

## 2013-10-14 NOTE — Progress Notes (Signed)
[facility]  Provider:  Dr Marco [name] Referring Provider: No ref. provider found Primary Care Physician:  Marco Arnt, MD  CC:  seizures  HPI:  Marco [name] is a 63 y.o. male here as a referral for follow up seizure disorder. At last visit he was clinically stable and was continued on Keppra 500mg  BID. Since that visit he had a breakthrough seizure resulting in status epilepticus, was admitted to [facility]. During course of workup it was discovered that he had a lung mass with workup pending. Discharged home keppra increasd to 750mg  BID. Presents today with caregiver he reports he is doing well, tolerating increase in dose well. No further seizure episodes. Currently resides in a group home.   Had head CT and DWI of brain in hospital, head CT unremarkable, DWI showed restricted diffusion in hippocampal region. Unable to tolerate rest of MRI brain.   Review of Systems: Out of a complete 14 system review, the patient complains of only the following symptoms, and all other reviewed systems are negative. Positive for seizure, mental retardation  History   Social History  . Marital Status: Single    Spouse Name: N/A    Number of Children: N/A  . Years of Education: N/A   Occupational History  . Not on file.   Social History Main Topics  . Smoking status: Former Smoker -- 0.50 packs/day for 35 years    Types: Cigarettes    Quit date: 09/07/2013  . Smokeless tobacco: Not on file  . Alcohol Use: No  . Drug Use: No  . Sexual Activity: No   Other Topics Concern  . Not on file   Social History Narrative   ** Merged History Encounter **       Patient lives in a group with 3 other brothers.    Patient does not work.   Patient is single.    Patient has some HS education.    Patient has no children.    Patient has a history of psychotic disorder NOS and mental retardation.     No family history on file.  Past Medical History  Diagnosis Date  . Mental  disorder     sczizophrenia;moderate retardation  . Depression   . DEMENTIA   . Hypertension     no medications, no documented history per caregiver at preadmission  . GERD (gastroesophageal reflux disease)   . Seizures   . Developmental disability     developmentaly delayed    Past Surgical History  Procedure Laterality Date  . Cataract extraction w/phaco  07/12/2011    Procedure: CATARACT EXTRACTION PHACO AND INTRAOCULAR LENS PLACEMENT (IOC);  Surgeon: Marco [name];  Location: [facility];  Service: Ophthalmology;  Laterality: Left;  Marland Kitchen Eye surgery      L eye  . Video bronchoscopy Bilateral 10/01/2013    Procedure: VIDEO BRONCHOSCOPY WITHOUT FLUORO;  Surgeon: Marco Gobble, MD;  Location: [facility];  Service: Cardiopulmonary;  Laterality: Bilateral;    Current Outpatient Prescriptions  Medication Sig Dispense Refill  . bisacodyl (DULCOLAX) 5 MG EC tablet Take 10 mg by mouth daily as needed for constipation.       . Cholecalciferol 5000 UNITS capsule Take 5,000 Units by mouth every 7 (seven) days.      Marland Kitchen donepezil (ARICEPT) 10 MG tablet Take 10 mg by mouth at bedtime.      . fluticasone (FLONASE) 50 MCG/ACT nasal spray Place 2 sprays into both nostrils daily.      Marland Kitchen  levETIRAcetam (KEPPRA) 500 MG tablet Take 500 mg by mouth 2 (two) times daily.      Marland Kitchen LORazepam (ATIVAN) 1 MG tablet Take 2 mg by mouth at bedtime.      Marland Kitchen omeprazole (PRILOSEC) 20 MG capsule Take 20 mg by mouth 2 (two) times daily.      . risperiDONE (RISPERDAL) 3 MG tablet Take 3 mg by mouth at bedtime.      . traZODone (DESYREL) 50 MG tablet Take 25 mg by mouth at bedtime.       No current facility-administered medications for this visit.    Allergies as of 10/14/2013  . (No Known Allergies)    Vitals: BP 130/87  Pulse 83  Ht 5\' 9"  (1.753 m)  Wt 156 lb (70.761 kg)  BMI 23.03 kg/m2 Last Weight:  Wt Readings from Last 1 Encounters:  10/14/13 156 lb (70.761 kg)   Last Height:   Ht Readings from Last 1  Encounters:  10/14/13 5\' 9"  (1.753 m)     Physical exam: Exam: Gen: NAD, conversant Eyes: anicteric sclerae, moist conjunctivae HENT: [place] Neck: Trachea midline; supple,  Lungs: CTA, no wheezing, rales, rhonic                          CV: RRR, no MRG Abdomen: Soft, non-tender;  Extremities: No peripheral edema  Skin: Normal temperature, no rash,  Psych: Appropriate affect, pleasant  Neuro: MS: Interactive, follows simple commands dysarthric difficult to understand speech   CN: PERRL, EOMI no nystagmus, no ptosis, sensation intact to LT V1-V3 bilat, face symmetric, no weakness, hearing grossly intact, shoulder shrug 5/5 bilat,  tongue protrudes midline, no fasiculations noted.  Motor: normal bulk and tone Strength: Moves all extremities spontaneously and symmetrically  Coord: Unable to test   Reflexes: symmetrical, bilat downgoing toes  Sens: LT intact in all extremities   Assessment:  After physical and neurologic examination, review of laboratory studies, imaging, neurophysiology testing and pre-existing records, assessment will be reviewed on the problem list.  Plan:  Treatment plan and additional workup will be reviewed under Problem List.  [name] is a pleasant 63 year old gentleman with a history of mental retardation, schizophrenia and dementia presenting for follow up evaluation for seizure disorder. Had breakthrough event resulting in status epilepticus and hospital admission. Keppra increased to 750mg  BID, tolerating well with no further breakthrough seizures. Found to have lung mass, workup ongoing. Will continue keppra at current dose for now, increase to 1gm BID if further breakthrough seizures.   1)seizures 2)MR 3)Schizophrenia  -continue keppra 750mg  BID -workup of lung mass per medicine team -follow up in 6 months

## 2013-10-16 ENCOUNTER — Ambulatory Visit: Payer: Medicaid Other | Attending: Cardiothoracic Surgery | Admitting: Physical Therapy

## 2013-10-16 ENCOUNTER — Other Ambulatory Visit: Payer: Self-pay

## 2013-10-16 ENCOUNTER — Institutional Professional Consult (permissible substitution) (INDEPENDENT_AMBULATORY_CARE_PROVIDER_SITE_OTHER): Payer: Medicaid Other | Admitting: Cardiothoracic Surgery

## 2013-10-16 VITALS — BP 133/81 | HR 95 | Temp 98.0°F | Resp 18 | Wt 151.8 lb

## 2013-10-16 DIAGNOSIS — G40909 Epilepsy, unspecified, not intractable, without status epilepticus: Secondary | ICD-10-CM | POA: Insufficient documentation

## 2013-10-16 DIAGNOSIS — C3490 Malignant neoplasm of unspecified part of unspecified bronchus or lung: Secondary | ICD-10-CM

## 2013-10-16 DIAGNOSIS — IMO0001 Reserved for inherently not codable concepts without codable children: Secondary | ICD-10-CM | POA: Insufficient documentation

## 2013-10-16 DIAGNOSIS — D381 Neoplasm of uncertain behavior of trachea, bronchus and lung: Secondary | ICD-10-CM

## 2013-10-16 DIAGNOSIS — R293 Abnormal posture: Secondary | ICD-10-CM | POA: Insufficient documentation

## 2013-10-16 DIAGNOSIS — R6250 Unspecified lack of expected normal physiological development in childhood: Secondary | ICD-10-CM | POA: Insufficient documentation

## 2013-10-17 ENCOUNTER — Ambulatory Visit (INDEPENDENT_AMBULATORY_CARE_PROVIDER_SITE_OTHER): Payer: Medicaid Other | Admitting: Cardiothoracic Surgery

## 2013-10-17 ENCOUNTER — Ambulatory Visit (HOSPITAL_COMMUNITY)
Admission: RE | Admit: 2013-10-17 | Discharge: 2013-10-17 | Disposition: A | Discharge status: Home / Self Care | Payer: Medicaid Other | Source: Ambulatory Visit | Attending: Cardiothoracic Surgery | Admitting: Cardiothoracic Surgery

## 2013-10-17 ENCOUNTER — Encounter: Payer: Self-pay | Admitting: Cardiothoracic Surgery

## 2013-10-17 VITALS — BP 130/83 | HR 106 | Resp 20 | Ht 69.0 in | Wt 151.0 lb

## 2013-10-17 DIAGNOSIS — D381 Neoplasm of uncertain behavior of trachea, bronchus and lung: Secondary | ICD-10-CM

## 2013-10-17 NOTE — Progress Notes (Signed)
[address]       [place] [address]             [phone]                    Vanden S Ortmann [place] Medical Record #[id number] Date of Birth: Sep 19, 1950  Referring: Collene Gobble, MD Primary Care: Leamon Arnt, MD  Chief Complaint:   Left Lung mass   History of Present Illness:    Marco [name] 63 y.o. male is seen in the office  today for left lung mass.Patient had  new onset of seizures in the spring of 2014.  He was started on Keppra in the spring had done well until January 5 when he presented to the emergency room with recurrent seizure. Chest x-ray done at that time a was suggestive of a left hilar mass subsequent CT and PET scan were performed. Patient was seen by the pulmonary service, bronchoscopy was performed by Dr. Lamonte Sakai :  The LLL airways were normal. There was a dark friable lesion occluding a segment of the LUL. 1:10000 epi was injected onto the lesion. Endobronchial brushings and endobronchial forceps biopsies were performed. 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  He comes to the office today with with his sister who is power of attorney and his caretaker who is health care power of attorney. The patient appears to be aware that he has a mass in his lung was totally unaware that this may be malignant and potentially life-threatening.   Current Activity/ Functional Status:  Patient is not independent with mobility/ambulation, transfers, ADL's, IADL's.   Zubrod Score: At the time of surgery this patient's most appropriate activity status/level should be described as: []     0    Normal activity, no symptoms [x]     1    Restricted in physical strenuous activity but ambulatory, able to do out light work []     2    Ambulatory and capable of self care, unable to do work activities, up and about               >50 % of waking hours                               []     3    Only limited self care, in bed greater than 50% of waking hours []     4    Completely disabled, no self care, confined to bed or chair []     5    Moribund   Past Medical History  Diagnosis Date  . Mental disorder     sczizophrenia;moderate retardation  . Depression   . DEMENTIA   . Hypertension     no medications, no documented history per caregiver at preadmission  . GERD (gastroesophageal reflux disease)   . Seizures   . Developmental disability     developmentaly delayed    Past Surgical History  Procedure Laterality Date  . Cataract extraction w/phaco  07/12/2011    Procedure: CATARACT EXTRACTION PHACO AND INTRAOCULAR LENS PLACEMENT (IOC);  Surgeon: Adonis Brook;  Location: [facility];  Service: Ophthalmology;  Laterality: Left;  Marland Kitchen Eye surgery      L eye  . Video bronchoscopy Bilateral 10/01/2013    Procedure: VIDEO BRONCHOSCOPY  WITHOUT FLUORO;  Surgeon: Collene Gobble, MD;  Location: [facility];  Service: Cardiopulmonary;  Laterality: Bilateral;    No family history on file.  History   Social History  . Marital Status: Single    Spouse Name: N/A    Number of Children: N/A  . Years of Education: N/A   Occupational History  . Not on file.   Social History Main Topics  . Smoking status: Former Smoker -- 0.50 packs/day for 35 years    Types: Cigarettes    Quit date: 09/07/2013  . Smokeless tobacco: Not on file  . Alcohol Use: No  . Drug Use: No  . Sexual Activity: No   Other Topics Concern  . Not on file   Social History Narrative   ** Merged History Encounter **       Patient lives in a group with 3 other brothers.    Patient does not work.   Patient is single.    Patient has some HS education.    Patient has no children.    Patient has a history of psychotic disorder NOS and mental retardation.     History  Smoking status  . Former Smoker -- 0.50 packs/day for 35 years  . Types: Cigarettes  . Quit date: 09/07/2013    Smokeless tobacco  . Not on file    History  Alcohol Use No     No Known Allergies  Current Outpatient Prescriptions  Medication Sig Dispense Refill  . bisacodyl (DULCOLAX) 5 MG EC tablet Take 10 mg by mouth daily as needed for constipation.       . Cholecalciferol 5000 UNITS capsule Take 5,000 Units by mouth every 7 (seven) days.      Marland Kitchen donepezil (ARICEPT) 10 MG tablet Take 10 mg by mouth at bedtime.      . fluticasone (FLONASE) 50 MCG/ACT nasal spray Place 2 sprays into both nostrils daily.      Marland Kitchen levETIRAcetam (KEPPRA) 500 MG tablet Take 500 mg by mouth 2 (two) times daily.      Marland Kitchen LORazepam (ATIVAN) 1 MG tablet Take 2 mg by mouth at bedtime.      Marland Kitchen omeprazole (PRILOSEC) 20 MG capsule Take 20 mg by mouth 2 (two) times daily.      . risperiDONE (RISPERDAL) 3 MG tablet Take 3 mg by mouth at bedtime.      . traZODone (DESYREL) 50 MG tablet Take 25 mg by mouth at bedtime.       No current facility-administered medications for this visit.     Review of Systems:     Cardiac Review of Systems: Y or N  Chest Pain [ n   ]  Resting SOB [  n ] Exertional SOB  [ y ]  Vertell Limber Florencio.Farrier  ]   Pedal Edema [ n  ]    Palpitations [n  ] Syncope  [ n ]   Presyncope [n   ]  General Review of Systems: [Y] = yes [  ]=no Constitional: recent weight change [ n ];  Wt loss over the last 3 months [   ] anorexia [  ]; fatigue [  ]; nausea [  ]; night sweats [  ]; fever [ n ]; or chills [n  ];          Dental: poor dentition[  ]; Last Dentist visit:   Eye : blurred vision [  ]; diplopia [   ]; vision changes [  ];  Amaurosis fugax[  ]; Resp: cough [  y];  wheezing[ y ];  hemoptysis[ n ]; shortness of breath[  ]; paroxysmal nocturnal dyspnea[ y ]; dyspnea on exertion[  ]; or orthopnea[  ];  GI:  gallstones[  ], vomiting[  ];  dysphagia[  ]; melena[  ];  hematochezia [  ]; heartburn[  ];   Hx of  Colonoscopy[  ]; GU: kidney stones [  ]; hematuria[  ];   dysuria [  ];  nocturia[  ];  history of     obstruction [   ]; urinary frequency [  ]             Skin: rash, swelling[  ];, hair loss[  ];  peripheral edema[  ];  or itching[  ]; Musculosketetal: myalgias[  ];  joint swelling[  ];  joint erythema[  ];  joint pain[  ];  back pain[  ];  Heme/Lymph: bruising[  ];  bleeding[  ];  anemia[  ];  Neuro: TIA[  ];  headaches[  ];  stroke[  ];  vertigo[  ];  seizures[  ];   paresthesias[  ];  difficulty walking[  ];  Psych:depression[  ]; anxiety[  ];  Endocrine: diabetes[  ];  thyroid dysfunction[  ];  Immunizations: Flu up to date [ y ]; Pneumococcal up to date [ y ];  Other:  Physical Exam: BP 133/81  Pulse 95  Temp(Src) 98 F (36.7 C)  Resp 18  Wt 151 lb 12.8 oz (68.856 kg)  SpO2 99%  PHYSICAL EXAMINATION:  General appearance: alert, cooperative, appears older than stated age and slowed mentation Neurologic: intact Heart: regular rate and rhythm, S1, S2 normal, no murmur, click, rub or gallop Lungs: clear to auscultation bilaterally Abdomen: soft, non-tender; bowel sounds normal; no masses,  no organomegaly Extremities: extremities normal, atraumatic, no cyanosis or edema and Homans sign is negative, no sign of DVT Patient is able to cooperate with exam, he has no carotid bruits, has no cervical supraclavicular or axillary adenopathy  Diagnostic Studies & Laboratory data:     Recent Radiology Findings:   Nm Pet Image Initial (pi) Skull Base To Thigh  09/22/2013   CLINICAL DATA:  Initial treatment strategy for lung mass.  EXAM: NUCLEAR MEDICINE PET SKULL BASE TO THIGH  FASTING BLOOD GLUCOSE:  Value: 96mg /dl  TECHNIQUE: 15.9 mCi F-18 FDG was injected intravenously. CT data was obtained and used for attenuation correction and anatomic localization only. (This was not acquired as a diagnostic CT examination.) Additional exam technical data entered on technologist worksheet.  COMPARISON:  Chest x-ray 09/08/2013.  FINDINGS: NECK  No hypermetabolic lymph nodes in the neck.  CHEST  Suspected left-sided  lung mass on recent chest radiograph corresponds to a macrolobulated 4.3 x 3.0 cm hypermetabolic (SUVmax = 78.2)NFAO upper lobe mass (image 82 of series 2). This is intimately associated with the left hilum such that left hilar lymphadenopathy and hypermetabolism cannot be excluded. No definite pathologically enlarged or hypermetabolic mediastinal or right hilar lymph nodes are noted. There is a cluster of peribronchovascular micronodularity in the periphery of the right upper lobe best demonstrated on image 85 of series 2, which corresponds with some low-level metabolic activity in the periphery of the right upper lobe on the PET portion of the examination (SUVmax = 2.8), presumably infectious or inflammatory. No other definite suspicious appearing pulmonary nodules or masses are noted. No acute consolidative airspace disease. No pleural effusions.  ABDOMEN/PELVIS  No abnormal hypermetabolic  activity within the liver, pancreas, adrenal glands, or spleen. No hypermetabolic lymph nodes in the abdomen or pelvis. 1.7 cm low-attenuation lesion lateral aspect of the interpolar region of the right kidney is incompletely characterized on today's CT examination, favored to represent a small cyst. No significant volume of ascites. No pneumoperitoneum. No pathologic distention of small bowel. Normal appendix. Prostate gland and urinary bladder are unremarkable in appearance.  SKELETON  Adjacent to the inferior aspect of the right scapula there is a focus of increased metabolic activity (SUVmax = 4.1), without a correlate on the CT portion of the examination. This may be physiologic or related to recent muscular strain. No focal hypermetabolic activity to suggest skeletal metastasis.  IMPRESSION: 1. 4.3 x 3.0 cm macrolobulated hypermetabolic left upper lobe mass in close contact with the left hilum, highly concerning for primary bronchogenic neoplasm. No definite evidence to suggest metastatic disease to the neck, abdomen or  pelvis. These findings are favored to represent either T2a, N0, Mx disease, or T2a, N1, Mx disease (i.e., either stage IA or IIA disease). Surgical consultation and correlation with biopsy is recommended. 2. Small cluster of peribronchovascular micronodules in the periphery of the right upper lobe with low level hypermetabolism in this region, presumably of infectious or inflammatory etiology. Attention on followup studies is recommended to ensure resolution. 3. Small focus of hypermetabolic activity adjacent to the inferior aspect of the right scapula, without definite correlate on the CT portion of the examination. This is nonspecific, but favored to be benign, either physiologic or related to muscular strain. 4. Additional incidental findings, as above.   Electronically Signed   By: Vinnie Langton M.D.   On: 09/22/2013 15:23      Recent Lab Findings: Lab Results  Component Value Date   WBC 9.1 09/10/2013   HGB 15.0 09/10/2013   HCT 45.2 09/10/2013   PLT 135* 09/10/2013   GLUCOSE 87 09/10/2013   ALT 27 09/09/2013   AST 56* 09/09/2013   NA 143 09/10/2013   K 3.6* 09/10/2013   CL 104 09/10/2013   CREATININE 0.90 09/10/2013   BUN 17 09/10/2013   CO2 21 09/10/2013   Diagnosis Patient: MARQUIN, PATINO Collected: 10/01/2013 Client: [facility] Accession: [id number] Endobronchial biopsy, LUL - INVASIVE SQUAMOUS CELL CARCINOMA   Assessment / Plan:   1. 4.3 x 3.0 cm macrolobulated hypermetabolic left upper lobe mass in close contact with the left hilumT2a, N0, Mx disease, or T2a, N1, Mx disease.  With hilar  location resection may require pneumonectomy. I have discussed the implications of surgery with patient and family/caretaker. His degree of ability to understand and cooperate with postop care will be limited. He ahs had no PFT's    2. Pulmonary function studies need to be performed  I will see patient and family/caregiver back soon after PFT's done to further discuss options  I spent 55  minutes counseling the patient face to face. The total time spent in the appointment was 60 minutes.  Grace Isaac MD      [address] [place] [address] Office [phone]   Beeper [phone]  10/17/2013 12:08 PM

## 2013-10-17 NOTE — Progress Notes (Signed)
[address]       [place] [address]             [phone]                    Karol S Degante [facility] Medical Record #[id number] Date of Birth: 1951/02/01  Referring: Collene Gobble, MD Primary Care: Leamon Arnt, MD  Chief Complaint:   Left Lung mass   History of Present Illness:    Marco [name] 63 y.o. male is seen in the office yesterday for left lung mass. Patient had  new onset of seizures in the spring of 2014.  He was started on Keppra in the spring had done well until January 5 when he presented to the emergency room with recurrent seizure. Chest x-ray done at that time a was suggestive of a left hilar mass subsequent CT and PET scan were performed. Patient was seen by the pulmonary service, bronchoscopy was performed by Dr. Lamonte Sakai :  The LLL airways were normal. There was a dark friable lesion occluding a segment of the LUL. 1:10000 epi was injected onto the lesion. Endobronchial brushings and endobronchial forceps biopsies were performed. 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  He comes to the office yesterday   with his sister who is power of attorney and his caretaker who is health care power of attorney. The patient appears to be aware that he has a mass in his lung was totally unaware that this may be malignant and potentially life-threatening.  After being seen yesterday the patient went today for pulmonary function studies, his sister and caregiver accompany him to assist. This was a total failure he was unable to perform any of the tests that were asked to complete the pulmonary function studies.    Current Activity/ Functional Status:  Patient is not independent with mobility/ambulation, transfers, ADL's, IADL's.   Zubrod Score: At the time of surgery this patient's most appropriate activity status/level should be described as: []      0    Normal activity, no symptoms [x]     1    Restricted in physical strenuous activity but ambulatory, able to do out light work []     2    Ambulatory and capable of self care, unable to do work activities, up and about               >50 % of waking hours                              []     3    Only limited self care, in bed greater than 50% of waking hours []     4    Completely disabled, no self care, confined to bed or chair []     5    Moribund   Past Medical History  Diagnosis Date  . Mental disorder     sczizophrenia;moderate retardation  . Depression   . DEMENTIA   . Hypertension     no medications, no documented history per caregiver at preadmission  . GERD (gastroesophageal reflux disease)   . Seizures   . Developmental disability     developmentaly delayed    Past Surgical History  Procedure Laterality Date  . Cataract extraction w/phaco  07/12/2011  Procedure: CATARACT EXTRACTION PHACO AND INTRAOCULAR LENS PLACEMENT (IOC);  Surgeon: Adonis Brook;  Location: [facility];  Service: Ophthalmology;  Laterality: Left;  Marland Kitchen Eye surgery      L eye  . Video bronchoscopy Bilateral 10/01/2013    Procedure: VIDEO BRONCHOSCOPY WITHOUT FLUORO;  Surgeon: Collene Gobble, MD;  Location: [facility];  Service: Cardiopulmonary;  Laterality: Bilateral;    No family history on file.  History   Social History  . Marital Status: Single    Spouse Name: N/A    Number of Children: N/A  . Years of Education: N/A   Occupational History  . Not on file.   Social History Main Topics  . Smoking status: Former Smoker -- 0.50 packs/day for 35 years    Types: Cigarettes    Quit date: 09/07/2013  . Smokeless tobacco: Not on file  . Alcohol Use: No  . Drug Use: No  . Sexual Activity: No   Other Topics Concern  . Not on file   Social History Narrative   ** Merged History Encounter **       Patient lives in a group with 3 other brothers.    Patient does not work.   Patient is single.     Patient has some HS education.    Patient has no children.    Patient has a history of psychotic disorder NOS and mental retardation.     History  Smoking status  . Former Smoker -- 0.50 packs/day for 35 years  . Types: Cigarettes  . Quit date: 09/07/2013  Smokeless tobacco  . Not on file    History  Alcohol Use No     No Known Allergies  Current Outpatient Prescriptions  Medication Sig Dispense Refill  . bisacodyl (DULCOLAX) 5 MG EC tablet Take 10 mg by mouth daily as needed for constipation.       . Cholecalciferol 5000 UNITS capsule Take 5,000 Units by mouth every 7 (seven) days.      Marland Kitchen donepezil (ARICEPT) 10 MG tablet Take 10 mg by mouth at bedtime.      . fluticasone (FLONASE) 50 MCG/ACT nasal spray Place 2 sprays into both nostrils daily.      Marland Kitchen levETIRAcetam (KEPPRA) 500 MG tablet Take 500 mg by mouth 2 (two) times daily.      Marland Kitchen LORazepam (ATIVAN) 1 MG tablet Take 2 mg by mouth at bedtime.      Marland Kitchen omeprazole (PRILOSEC) 20 MG capsule Take 20 mg by mouth 2 (two) times daily.      . risperiDONE (RISPERDAL) 3 MG tablet Take 3 mg by mouth at bedtime.      . traZODone (DESYREL) 50 MG tablet Take 25 mg by mouth at bedtime.       No current facility-administered medications for this visit.     Review of Systems:     Cardiac Review of Systems: Y or N  Chest Pain [ n   ]  Resting SOB [  n ] Exertional SOB  [ y ]  Vertell Limber Florencio.Farrier  ]   Pedal Edema [ n  ]    Palpitations [n  ] Syncope  [ n ]   Presyncope [n   ]  General Review of Systems: [Y] = yes [  ]=no Constitional: recent weight change [ n ];  Wt loss over the last 3 months [   ] anorexia [  ]; fatigue [  ]; nausea [  ]; night sweats [  ];  fever [ n ]; or chills [n  ];          Dental: poor dentition[  ]; Last Dentist visit:   Eye : blurred vision [  ]; diplopia [   ]; vision changes [  ];  Amaurosis fugax[  ]; Resp: cough [  y];  wheezing[ y ];  hemoptysis[ n ]; shortness of breath[  ]; paroxysmal nocturnal dyspnea[ y ];  dyspnea on exertion[  ]; or orthopnea[  ];  GI:  gallstones[  ], vomiting[  ];  dysphagia[  ]; melena[  ];  hematochezia [  ]; heartburn[  ];   Hx of  Colonoscopy[  ]; GU: kidney stones [  ]; hematuria[  ];   dysuria [  ];  nocturia[  ];  history of     obstruction [  ]; urinary frequency [  ]             Skin: rash, swelling[  ];, hair loss[  ];  peripheral edema[  ];  or itching[  ]; Musculosketetal: myalgias[  ];  joint swelling[  ];  joint erythema[  ];  joint pain[  ];  back pain[  ];  Heme/Lymph: bruising[  ];  bleeding[  ];  anemia[  ];  Neuro: TIA[  ];  headaches[  ];  stroke[  ];  vertigo[  ];  seizures[  ];   paresthesias[  ];  difficulty walking[  ];  Psych:depression[  ]; anxiety[  ];  Endocrine: diabetes[  ];  thyroid dysfunction[  ];  Immunizations: Flu up to date [ y ]; Pneumococcal up to date [ y ];  Other:  Physical Exam: BP 130/83  Pulse 106  Resp 20  Ht 5\' 9"  (1.753 m)  Wt 151 lb (68.493 kg)  BMI 22.29 kg/m2  SpO2 98%  PHYSICAL EXAMINATION:  General appearance: alert, cooperative, appears older than stated age and slowed mentation Neurologic: intact Heart: regular rate and rhythm, S1, S2 normal, no murmur, click, rub or gallop Lungs: clear to auscultation bilaterally Abdomen: soft, non-tender; bowel sounds normal; no masses,  no organomegaly Extremities: extremities normal, atraumatic, no cyanosis or edema and Homans sign is negative, no sign of DVT Patient is able to cooperate with exam, he has no carotid bruits, has no cervical supraclavicular or axillary adenopathy  Diagnostic Studies & Laboratory data:     Recent Radiology Findings:   Nm Pet Image Initial (pi) Skull Base To Thigh  09/22/2013   CLINICAL DATA:  Initial treatment strategy for lung mass.  EXAM: NUCLEAR MEDICINE PET SKULL BASE TO THIGH  FASTING BLOOD GLUCOSE:  Value: 96mg /dl  TECHNIQUE: 15.9 mCi F-18 FDG was injected intravenously. CT data was obtained and used for attenuation correction and  anatomic localization only. (This was not acquired as a diagnostic CT examination.) Additional exam technical data entered on technologist worksheet.  COMPARISON:  Chest x-ray 09/08/2013.  FINDINGS: NECK  No hypermetabolic lymph nodes in the neck.  CHEST  Suspected left-sided lung mass on recent chest radiograph corresponds to a macrolobulated 4.3 x 3.0 cm hypermetabolic (SUVmax = 70.3)JKKX upper lobe mass (image 82 of series 2). This is intimately associated with the left hilum such that left hilar lymphadenopathy and hypermetabolism cannot be excluded. No definite pathologically enlarged or hypermetabolic mediastinal or right hilar lymph nodes are noted. There is a cluster of peribronchovascular micronodularity in the periphery of the right upper lobe best demonstrated on image 85 of series 2, which corresponds with some low-level metabolic  activity in the periphery of the right upper lobe on the PET portion of the examination (SUVmax = 2.8), presumably infectious or inflammatory. No other definite suspicious appearing pulmonary nodules or masses are noted. No acute consolidative airspace disease. No pleural effusions.  ABDOMEN/PELVIS  No abnormal hypermetabolic activity within the liver, pancreas, adrenal glands, or spleen. No hypermetabolic lymph nodes in the abdomen or pelvis. 1.7 cm low-attenuation lesion lateral aspect of the interpolar region of the right kidney is incompletely characterized on today's CT examination, favored to represent a small cyst. No significant volume of ascites. No pneumoperitoneum. No pathologic distention of small bowel. Normal appendix. Prostate gland and urinary bladder are unremarkable in appearance.  SKELETON  Adjacent to the inferior aspect of the right scapula there is a focus of increased metabolic activity (SUVmax = 4.1), without a correlate on the CT portion of the examination. This may be physiologic or related to recent muscular strain. No focal hypermetabolic activity to  suggest skeletal metastasis.  IMPRESSION: 1. 4.3 x 3.0 cm macrolobulated hypermetabolic left upper lobe mass in close contact with the left hilum, highly concerning for primary bronchogenic neoplasm. No definite evidence to suggest metastatic disease to the neck, abdomen or pelvis. These findings are favored to represent either T2a, N0, Mx disease, or T2a, N1, Mx disease (i.e., either stage IA or IIA disease). Surgical consultation and correlation with biopsy is recommended. 2. Small cluster of peribronchovascular micronodules in the periphery of the right upper lobe with low level hypermetabolism in this region, presumably of infectious or inflammatory etiology. Attention on followup studies is recommended to ensure resolution. 3. Small focus of hypermetabolic activity adjacent to the inferior aspect of the right scapula, without definite correlate on the CT portion of the examination. This is nonspecific, but favored to be benign, either physiologic or related to muscular strain. 4. Additional incidental findings, as above.   Electronically Signed   By: Vinnie Langton M.D.   On: 09/22/2013 15:23      Recent Lab Findings: Lab Results  Component Value Date   WBC 9.1 09/10/2013   HGB 15.0 09/10/2013   HCT 45.2 09/10/2013   PLT 135* 09/10/2013   GLUCOSE 87 09/10/2013   ALT 27 09/09/2013   AST 56* 09/09/2013   NA 143 09/10/2013   K 3.6* 09/10/2013   CL 104 09/10/2013   CREATININE 0.90 09/10/2013   BUN 17 09/10/2013   CO2 21 09/10/2013   Diagnosis Patient: Marco [name], Marco [name] Collected: 10/01/2013 Client: [facility] Accession: [id number] Endobronchial biopsy, LUL - INVASIVE SQUAMOUS CELL CARCINOMA   Assessment / Plan:   1. 4.3 x 3.0 cm macrolobulated hypermetabolic left upper lobe mass in close contact with the left hilumT2a, N0, Mx disease, or T2a, N1, Mx disease.  With hilar  location resection may require pneumonectomy. I have discussed the implications of surgery with patient and family/caretaker. His  degree of ability to understand and cooperate with postop care will be limited. He has  had no PFT's , and today was unable to perform even basic spirometry. With the patient's inability to perform basic spirometry and anatomic setting that has a high likelihood of resulting in a pneumonectomy I am reluctant to recommend surgical intervention, because of the patient's ability to thoroughly cooperate in the postoperative period . After seeing his inability to even do the pulmonary functions his sister and caregiver after a discussion yesterday are also reluctant to agree to an operative procedure. The patient is very pleasant and cooperative and  potentially could be still long enough for radiation therapy, but he has definite trouble understanding any complex instructions.  We will make arrangements for him to be seen in the [facility] clinic Thursday, February 19 by medical oncology and radiation oncology to discuss with his caregivers and medical power of attorney the options that we have.   Grace Isaac MD      [address] [place] [address] Office [phone]   Beeper [phone]  10/17/2013 4:30 PM

## 2013-10-17 NOTE — Progress Notes (Signed)
Pt unable to complete PFT due to inability to follow commands. MD called and made aware.

## 2013-10-21 ENCOUNTER — Telehealth: Payer: Self-pay | Admitting: *Deleted

## 2013-10-21 NOTE — Telephone Encounter (Signed)
Called care giver gave an appt for [facility] 10/23/13.  She verbalized understanding of time and place of appt

## 2013-10-23 ENCOUNTER — Ambulatory Visit
Admission: RE | Admit: 2013-10-23 | Discharge: 2013-10-23 | Disposition: A | Discharge status: Home / Self Care | Payer: Medicaid Other | Source: Ambulatory Visit | Attending: Radiation Oncology | Admitting: Radiation Oncology

## 2013-10-23 ENCOUNTER — Ambulatory Visit (HOSPITAL_BASED_OUTPATIENT_CLINIC_OR_DEPARTMENT_OTHER): Payer: Medicaid Other | Admitting: Internal Medicine

## 2013-10-23 ENCOUNTER — Encounter: Payer: Self-pay | Admitting: *Deleted

## 2013-10-23 ENCOUNTER — Encounter: Payer: Self-pay | Admitting: Internal Medicine

## 2013-10-23 ENCOUNTER — Telehealth: Payer: Self-pay | Admitting: *Deleted

## 2013-10-23 VITALS — BP 147/92 | HR 83 | Temp 98.0°F | Resp 18 | Ht 69.0 in | Wt 158.7 lb

## 2013-10-23 DIAGNOSIS — C3410 Malignant neoplasm of upper lobe, unspecified bronchus or lung: Secondary | ICD-10-CM

## 2013-10-23 DIAGNOSIS — C3492 Malignant neoplasm of unspecified part of left bronchus or lung: Secondary | ICD-10-CM

## 2013-10-23 DIAGNOSIS — I10 Essential (primary) hypertension: Secondary | ICD-10-CM

## 2013-10-23 DIAGNOSIS — F329 Major depressive disorder, single episode, unspecified: Secondary | ICD-10-CM

## 2013-10-23 DIAGNOSIS — F71 Moderate intellectual disabilities: Secondary | ICD-10-CM

## 2013-10-23 DIAGNOSIS — F3289 Other specified depressive episodes: Secondary | ICD-10-CM

## 2013-10-23 MED ORDER — PROCHLORPERAZINE MALEATE 10 MG PO TABS: 10.0000 mg | ORAL_TABLET | Freq: Four times a day (QID) | ORAL | Status: DC | PRN

## 2013-10-23 NOTE — Progress Notes (Signed)
[facility] [phone]   Fax:(336) [phone] Multidisciplinary thoracic oncology clinic ([place]) CONSULT NOTE  REFERRING PHYSICIAN: Dr. Percell Miller B. Gerhardt  REASON FOR CONSULTATION:  63 years old African American male with unresectable lung cancer  HPI Marco [name] is a 63 y.o. male with a past medical history significant only for mental disorder including schizophrenia, moderate retardation, depression, dementia, hypertension, GERD and history of seizure activity. On 09/04/2013 the patient was admitted to [facility] with status epilepticus. He was treated for his seizure and currently on Keppra. During his evaluation CT scan of the head followed by MRI of the brain were performed on 09/04/2013 and showed no acute intracranial abnormalities. Chest x-ray on 09/08/2013 showed new perihilar pulmonary opacity bilaterally that may reflect atelectasis, edema or rarely inflammation. There is persistent concern left hilar mass and CT scan of the chest was recommended. On 09/09/2013 CT scan of the chest was performed and it showed central left upper lobe lung mass measuring 3.0 x 3.1 x 3.0 CM intimately associated with the left pulmonary artery LH involvement cannot be excluded. There was also a 0.7 CM and lateral AP window node, not pathologic by size criteria but mildly suspicious based on location. A PET scan was performed on 09/22/2013 and it showed 4.3 x 3.0 serum macrolobulated hypermetabolic left upper lobe mass in close contact with the left hilum, highly concerning for primary bronchogenic neoplasm. No definite evidence to suggest metastatic disease to the neck, abdomen or pelvis. The findings are favored to represent either T2 A., N0, MX disease or T2 A., N1, MX disease.  On 10/01/2013 the patient underwent fiberoptic bronchoscopy with biopsy of the left upper lobe lung mass. The final pathology (Accession: [id number]) was consistent with invasive squamous cell  carcinoma.  The patient was referred to Dr. Servando Snare for consideration of surgical resection but he was not a good surgical candidate because he would require complete left pneumonectomy, which the patient may not tolerate. Dr. Servando Snare kindly referred the patient to me today for evaluation and discussion of his treatment options. When seen today the patient is feeling fine with no specific complaints. He denied having any significant chest pain, shortness breath, cough or hemoptysis. He denied having any significant weight loss or night sweats. He has no headache or visual changes. His first seizure activity was in April 2014 and he was on Keppra 500 mg by mouth twice a day which increased recently increased to 750 milligrams by mouth twice a day after he presented to [facility] with seizure activity in January of 2015. Family history significant for her mother and father with hypertension and diabetes mellitus and brother with throat cancer. The patient is single and has no children. He was accompanied by his caregiver Zakiyaah. He lives in a group home facility. 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 He has a history of smoking one pack per day for around 35 years but quit 09/08/2013. He has no history of alcohol or drug abuse. HPI  Past Medical History  Diagnosis Date  . Mental disorder     sczizophrenia;moderate retardation  . Depression   . DEMENTIA   . Hypertension     no medications, no documented history per caregiver at preadmission  . GERD (gastroesophageal reflux disease)   . Seizures   . Developmental disability     developmentaly delayed    Past Surgical History  Procedure Laterality Date  .  Cataract extraction w/phaco  07/12/2011    Procedure: CATARACT EXTRACTION PHACO AND INTRAOCULAR LENS PLACEMENT (IOC);  Surgeon: Adonis Brook;  Location: [facility];  Service: Ophthalmology;  Laterality: Left;  Marland Kitchen Eye surgery      L eye  .  Video bronchoscopy Bilateral 10/01/2013    Procedure: VIDEO BRONCHOSCOPY WITHOUT FLUORO;  Surgeon: Collene Gobble, MD;  Location: [facility];  Service: Cardiopulmonary;  Laterality: Bilateral;    History reviewed. No pertinent family history.  Social History History  Substance Use Topics  . Smoking status: Former Smoker -- 0.50 packs/day for 35 years    Types: Cigarettes    Quit date: 09/07/2013  . Smokeless tobacco: Not on file  . Alcohol Use: No    No Known Allergies  Current Outpatient Prescriptions  Medication Sig Dispense Refill  . bisacodyl (DULCOLAX) 5 MG EC tablet Take 10 mg by mouth daily as needed for constipation.       Marland Kitchen donepezil (ARICEPT) 10 MG tablet Take 10 mg by mouth at bedtime.      . fluticasone (FLONASE) 50 MCG/ACT nasal spray Place 2 sprays into both nostrils daily.      Marland Kitchen levETIRAcetam (KEPPRA) 750 MG tablet Take 750 mg by mouth 2 (two) times daily.      Marland Kitchen LORazepam (ATIVAN) 1 MG tablet Take 2 mg by mouth at bedtime.      Marland Kitchen omeprazole (PRILOSEC) 20 MG capsule Take 20 mg by mouth 2 (two) times daily.      . risperiDONE (RISPERDAL) 3 MG tablet Take 3 mg by mouth at bedtime.      . traZODone (DESYREL) 50 MG tablet Take 25 mg by mouth at bedtime.      . prochlorperazine (COMPAZINE) 10 MG tablet Take 1 tablet (10 mg total) by mouth every 6 (six) hours as needed for nausea or vomiting.  60 tablet  0   No current facility-administered medications for this visit.    Review of Systems  Constitutional: negative Eyes: negative Ears, nose, mouth, throat, and face: negative Respiratory: negative Cardiovascular: negative Gastrointestinal: negative Genitourinary:negative Integument/breast: negative Hematologic/lymphatic: negative Musculoskeletal:negative Neurological: negative Behavioral/Psych: negative Endocrine: negative Allergic/Immunologic: negative  Physical Exam  EXN:TZGYF, healthy, no distress, well nourished and well developed SKIN: skin color,  texture, turgor are normal, no rashes or significant lesions HEAD: Normocephalic, No masses, lesions, tenderness or abnormalities EYES: normal, PERRLA EARS: External ears normal, Canals clear OROPHARYNX:no exudate, no erythema and lips, buccal mucosa, and tongue normal  NECK: supple, no adenopathy, no JVD LYMPH:  no palpable lymphadenopathy, no hepatosplenomegaly LUNGS: clear to auscultation , and palpation HEART: regular rate & rhythm, no murmurs and no gallops ABDOMEN:abdomen soft, non-tender, normal bowel sounds and no masses or organomegaly BACK: Back symmetric, no curvature., No CVA tenderness EXTREMITIES:no joint deformities, effusion, or inflammation, no edema, no skin discoloration  NEURO: alert & oriented x 3 with fluent speech, no focal motor/sensory deficits, gait normal  PERFORMANCE STATUS: ECOG 1  LABORATORY DATA: Lab Results  Component Value Date   WBC 9.1 09/10/2013   HGB 15.0 09/10/2013   HCT 45.2 09/10/2013   MCV 90.9 09/10/2013   PLT 135* 09/10/2013      Chemistry      Component Value Date/Time   NA 143 09/10/2013 0551   K 3.6* 09/10/2013 0551   CL 104 09/10/2013 0551   CO2 21 09/10/2013 0551   BUN 17 09/10/2013 0551   CREATININE 0.90 09/10/2013 0551      Component Value Date/Time  CALCIUM 9.3 09/10/2013 0551   ALKPHOS 64 09/09/2013 0415   AST 56* 09/09/2013 0415   ALT 27 09/09/2013 0415   BILITOT 0.7 09/09/2013 0415       RADIOGRAPHIC STUDIES: No results found.  ASSESSMENT: This is a very pleasant 63 years old African American male recently diagnosed with a stage IIA (T2a., N1, M0) non-small cell lung cancer, squamous cell carcinoma presented with central left upper lobe mass as well as hilar lymphadenopathy diagnosed in January of 2015. The patient is not a good surgical candidate according to Dr. Servando Snare.   PLAN: I have a lengthy discussion with the patient and his caregiver today about his current disease stage, prognosis and treatment options. I recommended for the  patient a course of concurrent chemoradiation with weekly carboplatin for AUC of 2 and paclitaxel 45 mg/M2 for a total of 6-7 weeks depending on the final dose of radiation. I discussed with the patient and and his caregiver the adverse effect of this treatment including but not limited to alopecia, myelosuppression, nausea and vomiting, peripheral neuropathy, liver or renal dysfunction. The patient was seen earlier today by Dr. Lisbeth Renshaw for evaluation and discussion of the radiotherapy option. I expect the patient to start the first cycle of this treatment on 11/10/2013 because of the required planning for radiation therapy. I will call his pharmacy was prescription for Compazine 10 mg by mouth every 6 hours as needed for nausea. The patient will have a chemotherapy education class before starting the first cycle of his treatment. He would come back for followup visit in 4 weeks for evaluation and management any adverse effect of his treatment  The patient voices understanding of current disease status and treatment options and is in agreement with the current care plan.  All questions were answered. The patient knows to call the clinic with any problems, questions or concerns. We can certainly see the patient much sooner if necessary.  Thank you so much for allowing me to participate in the care of Marco [name]. I will continue to follow up the patient with you and assist in his care.  I spent 60 minutes counseling the patient face to face. The total time spent in the appointment was 80 minutes.  Disclaimer: This note was dictated with voice recognition software. Similar sounding words can inadvertently be transcribed and may not be corrected upon review.   Winnell Bento K. 10/23/2013, 5:06 PM

## 2013-10-23 NOTE — Telephone Encounter (Signed)
Called care giver to change clinic time due to Rad Onc wanting to see pt.  She verbalized understanding of appt time

## 2013-10-24 ENCOUNTER — Encounter: Payer: Self-pay | Admitting: *Deleted

## 2013-10-24 ENCOUNTER — Encounter: Payer: Self-pay | Admitting: Internal Medicine

## 2013-10-24 ENCOUNTER — Telehealth: Payer: Self-pay | Admitting: *Deleted

## 2013-10-24 ENCOUNTER — Telehealth: Payer: Self-pay | Admitting: Internal Medicine

## 2013-10-24 ENCOUNTER — Other Ambulatory Visit: Payer: Self-pay | Admitting: *Deleted

## 2013-10-24 DIAGNOSIS — C3410 Malignant neoplasm of upper lobe, unspecified bronchus or lung: Secondary | ICD-10-CM | POA: Insufficient documentation

## 2013-10-24 NOTE — Telephone Encounter (Signed)
s.w. pt daughter and advised on March appt....ok adn aware will pick up printed sched at nxt visit

## 2013-10-24 NOTE — Progress Notes (Signed)
   Thoracic Treatment Summary Name:Marco [name] Date:10/24/2013 DOB:Aug 28, 1951 Your Medical Team Medical Oncologist: Dr. [name] Radiation Oncologist: Dr. Lisbeth Renshaw Pulmonologist: Dr. Lamonte Sakai Surgeon: Dr. Servando Snare Type and Stage of Lung Cancer Non-Small Cell Carcinoma:  Squamous Cell  Clinical Stage:  II   Clinical stage is based on radiology exams.  Pathological stage will be determined after surgery.  Staging is based on the size of the tumor, involvement of lymph nodes or not, and whether or not the cancer center has spread. Recommendations Recommendations: Concurrent chemoradiation therapy  These recommendations are based on information available as of today's consult.  This is subject to change depending further testing or exams. Next Steps Next Step: 1. Medical Oncology will call to set up chemotherapy appointments 2. Chemotherapy Education 11/03/2013 3. Radiation Oncology will call to set up radiation appointments  Barriers to Care What do you perceive as a potential barrier that may prevent you from receiving your treatment plan? Barrier: education and understanding of cancer care Intervention: Patient has great support system.  I gave and explained information on lung cancer. Questions Norton Blizzard, RN BSN Thoracic Oncology Nurse Navigator at [facility] is a nurse navigator that is available to assist you through your cancer journey.  She can answer your questions and/or provide resources regarding your treatment plan, emotional support, or financial concerns.

## 2013-10-24 NOTE — Progress Notes (Addendum)
Radiation Oncology         [phone] ________________________________  Name: MONTERIUS ROLF MRN: [id number]  Date: 10/23/2013  DOB: Aug 14, 1951  LY:YTKP,TWSFKCL L, MD  Grace Isaac, MD   Curt Bears, MD  REFERRING PHYSICIAN: Grace Isaac, MD   DIAGNOSIS: The encounter diagnosis was Malignant neoplasm of upper lobe, bronchus or lung.   HISTORY OF PRESENT ILLNESS::Marco [name] is a 63 y.o. male who is seen for an initial consultation visit.  The patient was seen today in multidisciplinary thoracic conference. He has a past medical history including mental retardation and schizophrenia. The patient began new onset seizures in the spring of 2014. He was started on Keppra and did well until he had recurrent seizure earlier this year in January. A chest x-ray at that time suggested a left hilar mass and further workup was recommended.  The patient's workup has included a CT scan of the chest on 09/09/2013. This revealed a central left upper lobe mass consistent with a primary bronchogenic carcinoma. The mass measures 3.0 x 3.1 cm. Additional imaging has included a PET scan on 09/22/2013. This showed a macrolobulated 4.3 cm mass in the left upper lobe centrally which was hypermetabolic. The maximum SUV was 20.6. This is intimately associated with the left hilum. Left hilar lymphadenopathy with hypermetabolic activity could not be excluded. No sign of additional regional or distant disease. The patient has also undergone a recent MRI scan of the brain. The patient could not tolerate the full list of sequences. Based on this scan with regards to restricted diffusion no intracranial metastatic disease was seen. This was discussed in thoracic conference this morning and was felt to be adequate for initial staging purposes.  The patient underwent bronchoscopy. A dark friable lesion was seen including a segment of the left upper lobe. Endobronchial brushings and biopsy was performed. The  biopsy returned positive squamous cell carcinoma. The patient therefore has been diagnosed with non-small cell lung cancer, squamous cell carcinoma. Clinically this has been diagnosed as a T2 A. N0 M0 tumor, stage I A. N1 disease could not be excluded on the PET scan.  The patient has been seen by Dr. Servando Snare in thoracic surgery. He has evaluated the patient and does not feel that he is a good operative candidate. I have therefore been asked to see the patient today regarding possible radiation treatment. The patient also is seeing Dr. Earlie Server in medical oncology.     PREVIOUS RADIATION THERAPY: No   PAST MEDICAL HISTORY:  has a past medical history of Mental disorder; Depression; DEMENTIA; Hypertension; GERD (gastroesophageal reflux disease); Seizures; and Developmental disability.     PAST SURGICAL HISTORY: Past Surgical History  Procedure Laterality Date  . Cataract extraction w/phaco  07/12/2011    Procedure: CATARACT EXTRACTION PHACO AND INTRAOCULAR LENS PLACEMENT (IOC);  Surgeon: Adonis Brook;  Location: [facility];  Service: Ophthalmology;  Laterality: Left;  Marland Kitchen Eye surgery      L eye  . Video bronchoscopy Bilateral 10/01/2013    Procedure: VIDEO BRONCHOSCOPY WITHOUT FLUORO;  Surgeon: Collene Gobble, MD;  Location: [facility];  Service: Cardiopulmonary;  Laterality: Bilateral;     FAMILY HISTORY: family history is not on file.   SOCIAL HISTORY:  reports that he quit smoking about 6 weeks ago. His smoking use included Cigarettes. He has a 17.5 pack-year smoking history. He does not have any smokeless tobacco history on file. He reports that he does not drink alcohol or use illicit drugs.  ALLERGIES: Review of patient's allergies indicates no known allergies.   MEDICATIONS:  Current Outpatient Prescriptions  Medication Sig Dispense Refill  . bisacodyl (DULCOLAX) 5 MG EC tablet Take 10 mg by mouth daily as needed for constipation.       Marland Kitchen donepezil (ARICEPT) 10 MG tablet Take 10  mg by mouth at bedtime.      . fluticasone (FLONASE) 50 MCG/ACT nasal spray Place 2 sprays into both nostrils daily.      Marland Kitchen levETIRAcetam (KEPPRA) 750 MG tablet Take 750 mg by mouth 2 (two) times daily.      Marland Kitchen LORazepam (ATIVAN) 1 MG tablet Take 2 mg by mouth at bedtime.      Marland Kitchen omeprazole (PRILOSEC) 20 MG capsule Take 20 mg by mouth 2 (two) times daily.      . prochlorperazine (COMPAZINE) 10 MG tablet Take 1 tablet (10 mg total) by mouth every 6 (six) hours as needed for nausea or vomiting.  60 tablet  0  . risperiDONE (RISPERDAL) 3 MG tablet Take 3 mg by mouth at bedtime.      . traZODone (DESYREL) 50 MG tablet Take 25 mg by mouth at bedtime.       No current facility-administered medications for this encounter.     REVIEW OF SYSTEMS:  A 15 point review of systems is documented in the electronic medical record. This was obtained by the nursing staff. However, I reviewed this with the patient to discuss relevant findings and make appropriate changes.  Pertinent items are noted in HPI.    PHYSICAL EXAM:  vitals were not taken for this visit.  ECOG = 0  0 - Asymptomatic (Fully active, able to carry on all predisease activities without restriction)  1 - Symptomatic but completely ambulatory (Restricted in physically strenuous activity but ambulatory and able to carry out work of a light or sedentary nature. For example, light housework, office work)  2 - Symptomatic, <50% in bed during the day (Ambulatory and capable of all self care but unable to carry out any work activities. Up and about more than 50% of waking hours)  3 - Symptomatic, >50% in bed, but not bedbound (Capable of only limited self-care, confined to bed or chair 50% or more of waking hours)  4 - Bedbound (Completely disabled. Cannot carry on any self-care. Totally confined to bed or chair)  5 - Death   Eustace Pen MM, Creech RH, Tormey DC, et al. [phone]). "Toxicity and response criteria of the [facility]".  [place] Oncol. 5 (6): 649-55  General:  in no acute distress; the patient did not answer questions significantly during the exam today. Answers were given by the medical power of attorney, a family member who accompanied him today. The patient is able to follow limited instructions and was very cooperative during the consult and exam HEENT: Normocephalic, atraumatic; oral cavity clear Neck: Supple without any lymphadenopathy Cardiovascular: Regular rate and rhythm Respiratory: Clear to auscultation bilaterally GI: Soft, nontender, normal bowel sounds Extremities: No edema present Neuro: No focal deficits     LABORATORY DATA:  Lab Results  Component Value Date   WBC 9.1 09/10/2013   HGB 15.0 09/10/2013   HCT 45.2 09/10/2013   MCV 90.9 09/10/2013   PLT 135* 09/10/2013   Lab Results  Component Value Date   NA 143 09/10/2013   K 3.6* 09/10/2013   CL 104 09/10/2013   CO2 21 09/10/2013   Lab Results  Component Value Date  ALT 27 09/09/2013   AST 56* 09/09/2013   ALKPHOS 64 09/09/2013   BILITOT 0.7 09/09/2013      RADIOGRAPHY: No results found.     IMPRESSION:  the patient has a new diagnosis of non-small cell lung cancer involving the left upper lobe in the region of the left hilum. This appears clinically to represent a T2 A. N0 M0 tumor.  The patient's case was discussed at multidisciplinary thoracic conference this morning. He is not a good surgical candidate and therefore a course of chemoradiotherapy has been recommended for him.  I discussed with the patient and his family member the rationale of this recommendation. We discussed his overall case as well as the specifics surrounding radiation treatment. I discussed with them the potential 6-1/2 week course of radiation treatment. We discussed the benefit in terms of local control. We also discussed the possible side effects and risks of treatment. We also discussed the details/logistics of radiation treatment. I believe that the patient will  be able to tolerate treatment given how he has done for imaging, especially with respect to CT imaging. He did have some difficulty with the MRI scan. I believe however he will be able to receive radiation treatment and hopefully this will become easier as he goes through treatment and gets in a routine which the family member did feel was a reasonable expectation.   PLAN:  The patient will be scheduled for a simulation on 11/03/2013. Clinically he is doing well with no significant shortness of breath at this time. We will plan to begin chemoradiation on 11/10/2013. Some adjustments will be made in terms of his treatment to speed up the process as much as possible while still delivering high quality care. This likely will involve not involve a daily cone beam CT scan. Rather optical guidance with AlignRT will be used on a daily basis.     I spent 60 minutes face to face with the patient and more than 50% of that time was spent in counseling and/or coordination of care.    ________________________________   Jodelle Gross, MD, PhD

## 2013-10-24 NOTE — Progress Notes (Signed)
Put sister's fmla form on nurse's desk.

## 2013-10-24 NOTE — Telephone Encounter (Signed)
Called care giver to change chemo ed class to one on one.  Ok by Abigail Butts, RN chemo education instructor

## 2013-10-28 ENCOUNTER — Encounter: Payer: Self-pay | Admitting: Internal Medicine

## 2013-10-28 ENCOUNTER — Ambulatory Visit: Payer: Self-pay | Admitting: Emergency Medicine

## 2013-10-28 NOTE — Progress Notes (Signed)
Put sister's fmla form in registration desk.

## 2013-11-03 ENCOUNTER — Encounter: Payer: Self-pay | Admitting: *Deleted

## 2013-11-03 ENCOUNTER — Ambulatory Visit
Admission: RE | Admit: 2013-11-03 | Discharge: 2013-11-03 | Disposition: A | Discharge status: Home / Self Care | Payer: Medicare Other | Source: Ambulatory Visit | Attending: Radiation Oncology | Admitting: Radiation Oncology

## 2013-11-03 ENCOUNTER — Other Ambulatory Visit: Payer: Medicare Other

## 2013-11-03 DIAGNOSIS — Z510 Encounter for antineoplastic radiation therapy: Secondary | ICD-10-CM | POA: Insufficient documentation

## 2013-11-03 DIAGNOSIS — Y842 Radiological procedure and radiotherapy as the cause of abnormal reaction of the patient, or of later complication, without mention of misadventure at the time of the procedure: Secondary | ICD-10-CM | POA: Insufficient documentation

## 2013-11-03 DIAGNOSIS — L989 Disorder of the skin and subcutaneous tissue, unspecified: Secondary | ICD-10-CM | POA: Insufficient documentation

## 2013-11-03 DIAGNOSIS — C3410 Malignant neoplasm of upper lobe, unspecified bronchus or lung: Secondary | ICD-10-CM | POA: Insufficient documentation

## 2013-11-03 DIAGNOSIS — Z79899 Other long term (current) drug therapy: Secondary | ICD-10-CM | POA: Insufficient documentation

## 2013-11-03 DIAGNOSIS — C3490 Malignant neoplasm of unspecified part of unspecified bronchus or lung: Secondary | ICD-10-CM

## 2013-11-03 NOTE — Progress Notes (Signed)
Patient and POA Marco [name] with patient to nursing after Ct simulation today, patient stated name, cannot state dob as identification, mentally challenged, discussed med list and reviewed with POA, also gave book radiation therapy and you with [name],, went over side effects with her, pain, skin irritation, throat changes, sob, , difficulty swaloowing, will see patient and her Friday for port and patient to start treatment to lung on Monday 11/10/13, will reinforce teaching weekly 9:45 AM

## 2013-11-03 NOTE — Addendum Note (Signed)
Encounter addended by: Marye Round, MD on: 11/03/2013  6:46 AM<BR>     Documentation filed: Notes Section

## 2013-11-04 NOTE — Progress Notes (Signed)
  Radiation Oncology         [phone]) [phone] ________________________________  Name: Marco [name] MRN: [id number]  Date: 11/03/2013  DOB: [birth year]  SIMULATION AND TREATMENT PLANNING NOTE  DIAGNOSIS:  Lung cancer  NARRATIVE:  The patient was brought to the [facility].  Identity was confirmed.  All relevant records and images related to the planned course of therapy were reviewed.   Written consent to proceed with treatment was confirmed which was freely given after reviewing the details related to the planned course of therapy had been reviewed with the patient.  Then, the patient was set-up in a stable reproducible supine position for radiation therapy.  The patient's arms were raised above using a wing board device. CT images were obtained.  An isocenter was placed within the chest in relation to the target volume. Surface markings were placed.    The CT images were loaded into the planning software.  Then the target and avoidance structures were contoured.  Treatment planning then occurred.  The radiation prescription was entered and confirmed.  A total of 5 complex treatment devices were fabricated which correspond to the designed customized radiation treatment fields. Each of these customized fields/ complex treatment devices will be used on a daily basis during the radiation course. I have requested a 3D Simulation.  I have requested a DVH of the following structures: target volume, spinal cord, lungs, esophagus.   PLAN:  The patient will initially receive 60 Gy in 30 fractions. It is anticipated that the patient will then received a 6 gray boost. The patient's final total dose will be 66 gray.   Special treatment procedure The patient will receive chemotherapy during the course of radiation treatment. The patient may experience increased or overlapping toxicity due to this combined-modality approach and the patient will be monitored for such problems. This may include extra  lab work as necessary. This therefore constitutes a special treatment procedure.   ________________________________   Jodelle Gross, MD, PhD

## 2013-11-07 ENCOUNTER — Ambulatory Visit: Admission: RE | Admit: 2013-11-07 | Payer: Medicare Other | Source: Ambulatory Visit | Admitting: Radiation Oncology

## 2013-11-07 ENCOUNTER — Ambulatory Visit
Admission: RE | Admit: 2013-11-07 | Discharge: 2013-11-07 | Disposition: A | Discharge status: Home / Self Care | Payer: Medicare Other | Source: Ambulatory Visit | Attending: Radiation Oncology | Admitting: Radiation Oncology

## 2013-11-10 ENCOUNTER — Other Ambulatory Visit (HOSPITAL_BASED_OUTPATIENT_CLINIC_OR_DEPARTMENT_OTHER): Payer: Medicare Other

## 2013-11-10 ENCOUNTER — Ambulatory Visit: Payer: Medicare Other

## 2013-11-10 ENCOUNTER — Ambulatory Visit
Admission: RE | Admit: 2013-11-10 | Discharge: 2013-11-10 | Disposition: A | Discharge status: Home / Self Care | Payer: Medicare Other | Source: Ambulatory Visit | Attending: Radiation Oncology | Admitting: Radiation Oncology

## 2013-11-10 ENCOUNTER — Ambulatory Visit (HOSPITAL_BASED_OUTPATIENT_CLINIC_OR_DEPARTMENT_OTHER): Payer: Medicare Other

## 2013-11-10 ENCOUNTER — Other Ambulatory Visit: Payer: Self-pay | Admitting: Internal Medicine

## 2013-11-10 VITALS — BP 126/84 | HR 80 | Temp 97.2°F | Resp 18

## 2013-11-10 DIAGNOSIS — C3410 Malignant neoplasm of upper lobe, unspecified bronchus or lung: Secondary | ICD-10-CM

## 2013-11-10 DIAGNOSIS — C3492 Malignant neoplasm of unspecified part of left bronchus or lung: Secondary | ICD-10-CM

## 2013-11-10 DIAGNOSIS — Z5111 Encounter for antineoplastic chemotherapy: Secondary | ICD-10-CM

## 2013-11-10 DIAGNOSIS — C3490 Malignant neoplasm of unspecified part of unspecified bronchus or lung: Secondary | ICD-10-CM

## 2013-11-10 LAB — CBC WITH DIFFERENTIAL/PLATELET
BASO%: 1 % (ref 0.0–2.0)
Basophils Absolute: 0.1 10*3/uL (ref 0.0–0.1)
EOS ABS: 0 10*3/uL (ref 0.0–0.5)
EOS%: 0.3 % (ref 0.0–7.0)
HCT: 41.9 % (ref 38.4–49.9)
HGB: 13.6 g/dL (ref 13.0–17.1)
LYMPH%: 22.3 % (ref 14.0–49.0)
MCH: 28.9 pg (ref 27.2–33.4)
MCHC: 32.4 g/dL (ref 32.0–36.0)
MCV: 89.1 fL (ref 79.3–98.0)
MONO#: 0.6 10*3/uL (ref 0.1–0.9)
MONO%: 8.3 % (ref 0.0–14.0)
NEUT%: 68.1 % (ref 39.0–75.0)
NEUTROS ABS: 4.9 10*3/uL (ref 1.5–6.5)
Platelets: 162 10*3/uL (ref 140–400)
RBC: 4.7 10*6/uL (ref 4.20–5.82)
RDW: 14.5 % (ref 11.0–14.6)
WBC: 7.1 10*3/uL (ref 4.0–10.3)
lymph#: 1.6 10*3/uL (ref 0.9–3.3)

## 2013-11-10 LAB — COMPREHENSIVE METABOLIC PANEL (CC13)
ALT: 9 U/L (ref 0–55)
AST: 9 U/L (ref 5–34)
Albumin: 3.8 g/dL (ref 3.5–5.0)
Alkaline Phosphatase: 65 U/L (ref 40–150)
Anion Gap: 7 mEq/L (ref 3–11)
BUN: 16.5 mg/dL (ref 7.0–26.0)
CO2: 30 mEq/L — ABNORMAL HIGH (ref 22–29)
Calcium: 9.2 mg/dL (ref 8.4–10.4)
Chloride: 105 mEq/L (ref 98–109)
Creatinine: 0.9 mg/dL (ref 0.7–1.3)
GLUCOSE: 92 mg/dL (ref 70–140)
Potassium: 3.8 mEq/L (ref 3.5–5.1)
Sodium: 142 mEq/L (ref 136–145)
TOTAL PROTEIN: 6.8 g/dL (ref 6.4–8.3)
Total Bilirubin: 0.33 mg/dL (ref 0.20–1.20)

## 2013-11-10 MED ORDER — CARBOPLATIN CHEMO INJECTION 450 MG/45ML
218.8000 mg | Freq: Once | INTRAVENOUS | Status: AC
  Administered 2013-11-10: 220 mg via INTRAVENOUS
  Filled 2013-11-10: qty 22

## 2013-11-10 MED ORDER — DEXAMETHASONE SODIUM PHOSPHATE 20 MG/5ML IJ SOLN
20.0000 mg | Freq: Once | INTRAMUSCULAR | Status: AC
  Administered 2013-11-10: 20 mg via INTRAVENOUS

## 2013-11-10 MED ORDER — FAMOTIDINE IN NACL 20-0.9 MG/50ML-% IV SOLN
20.0000 mg | Freq: Once | INTRAVENOUS | Status: AC
  Administered 2013-11-10: 20 mg via INTRAVENOUS

## 2013-11-10 MED ORDER — SODIUM CHLORIDE 0.9 % IV SOLN
Freq: Once | INTRAVENOUS | Status: AC
  Administered 2013-11-10: 12:00:00 via INTRAVENOUS

## 2013-11-10 MED ORDER — DEXAMETHASONE SODIUM PHOSPHATE 20 MG/5ML IJ SOLN
INTRAMUSCULAR | Status: AC
  Filled 2013-11-10: qty 5

## 2013-11-10 MED ORDER — ONDANSETRON 16 MG/50ML IVPB (CHCC)
16.0000 mg | Freq: Once | INTRAVENOUS | Status: AC
  Administered 2013-11-10: 16 mg via INTRAVENOUS

## 2013-11-10 MED ORDER — FAMOTIDINE IN NACL 20-0.9 MG/50ML-% IV SOLN
INTRAVENOUS | Status: AC
  Filled 2013-11-10: qty 50

## 2013-11-10 MED ORDER — DIPHENHYDRAMINE HCL 50 MG/ML IJ SOLN
50.0000 mg | Freq: Once | INTRAMUSCULAR | Status: AC
  Administered 2013-11-10: 50 mg via INTRAVENOUS

## 2013-11-10 MED ORDER — DIPHENHYDRAMINE HCL 50 MG/ML IJ SOLN
INTRAMUSCULAR | Status: AC
  Filled 2013-11-10: qty 1

## 2013-11-10 MED ORDER — SODIUM CHLORIDE 0.9 % IV SOLN
45.0000 mg/m2 | Freq: Once | INTRAVENOUS | Status: AC
  Administered 2013-11-10: 84 mg via INTRAVENOUS
  Filled 2013-11-10: qty 14

## 2013-11-10 MED ORDER — ONDANSETRON 16 MG/50ML IVPB (CHCC)
INTRAVENOUS | Status: AC
  Filled 2013-11-10: qty 16

## 2013-11-10 NOTE — Patient Instructions (Signed)
[facility] Discharge Instructions for Patients Receiving Chemotherapy  Today you received the following chemotherapy agents: taxol, carboplatin  To help prevent nausea and vomiting after your treatment, we encourage you to take your nausea medication as prescribed.  If you develop nausea and vomiting that is not controlled by your nausea medication, call the clinic.   BELOW ARE SYMPTOMS THAT SHOULD BE REPORTED IMMEDIATELY:  *FEVER GREATER THAN 100.5 F  *CHILLS WITH OR WITHOUT FEVER  NAUSEA AND VOMITING THAT IS NOT CONTROLLED WITH YOUR NAUSEA MEDICATION  *UNUSUAL SHORTNESS OF BREATH  *UNUSUAL BRUISING OR BLEEDING  TENDERNESS IN MOUTH AND THROAT WITH OR WITHOUT PRESENCE OF ULCERS  *URINARY PROBLEMS  *BOWEL PROBLEMS  UNUSUAL RASH Items with * indicate a potential emergency and should be followed up as soon as possible.  Feel free to call the clinic you have any questions or concerns. The clinic phone number is [phone].

## 2013-11-11 ENCOUNTER — Ambulatory Visit (INDEPENDENT_AMBULATORY_CARE_PROVIDER_SITE_OTHER): Payer: Medicare Other | Admitting: Emergency Medicine

## 2013-11-11 ENCOUNTER — Encounter: Payer: Self-pay | Admitting: Emergency Medicine

## 2013-11-11 ENCOUNTER — Ambulatory Visit
Admission: RE | Admit: 2013-11-11 | Discharge: 2013-11-11 | Disposition: A | Discharge status: Home / Self Care | Payer: Medicare Other | Source: Ambulatory Visit | Attending: Radiation Oncology | Admitting: Radiation Oncology

## 2013-11-11 ENCOUNTER — Ambulatory Visit: Payer: Medicare Other

## 2013-11-11 VITALS — BP 130/76 | HR 79 | Ht 68.0 in | Wt 155.6 lb

## 2013-11-11 DIAGNOSIS — F17200 Nicotine dependence, unspecified, uncomplicated: Secondary | ICD-10-CM

## 2013-11-11 DIAGNOSIS — C3490 Malignant neoplasm of unspecified part of unspecified bronchus or lung: Secondary | ICD-10-CM

## 2013-11-11 NOTE — Progress Notes (Signed)
Subjective:    Patient ID: Marco [name], male    DOB: [birth year], 63 y.o.   MRN: [id number]  HPI 63 yo man, former smoker (18pk-yrs), hx seizures, mental retardation, admitted for status epilepticus from 1/4-09/10/13. That hospitalization showed a central LUL mass. He has been discharged to home and underwent PET  > shows that the lesion is hypermetabolic. He presents today to discuss biopsy.   ROV 10/10/13 -- follows today after his FOB to check status and review path and plans to discuss therapy.  Unfortunately shows squamous cell lung CA. He was discussed in thoracic conference on 10/09/13 and it was recommended that he undergo a full MRI (probably with sedation) to get appropriate staging.   ROV 11/11/13 -- hx tobacco, new dx squamous cell lung CA.  He has been seen by Drs Servando Snare, Frances Nickels. After their eval they have decide to pursue chemo and XRT. He had his first treatment yesterday. Denies any trouble w breathing. Occasional cough but minimal.    Review of Systems  Constitutional: Negative for fever and unexpected weight change.  HENT: Positive for congestion, postnasal drip and sinus pressure. Negative for dental problem, ear pain, nosebleeds, rhinorrhea, sneezing, sore throat and trouble swallowing.   Eyes: Negative for redness and itching.  Respiratory: Positive for cough and shortness of breath. Negative for chest tightness and wheezing.   Cardiovascular: Negative for palpitations and leg swelling.  Gastrointestinal: Negative for nausea and vomiting.  Genitourinary: Negative for dysuria.  Musculoskeletal: Negative for joint swelling.  Skin: Negative for rash.  Neurological: Negative for headaches.  Hematological: Does not bruise/bleed easily.  Psychiatric/Behavioral: Negative for dysphoric mood. The patient is not nervous/anxious.        Objective:   Physical Exam Filed Vitals:   11/11/13 1603  BP: 130/76  Pulse: 79  Height: 5\' 8"  (1.727 m)  Weight: 155 lb 9.6 oz  (70.58 kg)  SpO2: 100%   Gen: Pleasant, well-nourished, in no distress, developmental delay  ENT: Poor dentition,  mouth clear,  oropharynx clear, no postnasal drip  Neck: No JVD, no TMG, no carotid bruits  Lungs: No use of accessory muscles,  clear without rales or rhonchi  Cardiovascular: RRR, heart sounds normal, no murmur or gallops, no peripheral edema  Musculoskeletal: No deformities, no cyanosis or clubbing  Neuro: alert, non focal  Skin: Warm, no lesions or rashes   PET scan 09/22/13 COMPARISON: Chest x-ray 09/08/2013.  FINDINGS:  NECK  No hypermetabolic lymph nodes in the neck.  CHEST  Suspected left-sided lung mass on recent chest radiograph  corresponds to a macrolobulated 4.3 x 3.0 cm hypermetabolic (SUVmax  = 97.6)BHAL upper lobe mass (image 82 of series 2). This is  intimately associated with the left hilum such that left hilar  lymphadenopathy and hypermetabolism cannot be excluded. No definite  pathologically enlarged or hypermetabolic mediastinal or right hilar  lymph nodes are noted. There is a cluster of peribronchovascular  micronodularity in the periphery of the right upper lobe best  demonstrated on image 85 of series 2, which corresponds with some  low-level metabolic activity in the periphery of the right upper  lobe on the PET portion of the examination (SUVmax = 2.8),  presumably infectious or inflammatory. No other definite suspicious  appearing pulmonary nodules or masses are noted. No acute  consolidative airspace disease. No pleural effusions.  ABDOMEN/PELVIS  No abnormal hypermetabolic activity within the liver, pancreas,  adrenal glands, or spleen. No hypermetabolic lymph nodes in the  abdomen  or pelvis. 1.7 cm low-attenuation lesion lateral aspect of  the interpolar region of the right kidney is incompletely  characterized on today's CT examination, favored to represent a  small cyst. No significant volume of ascites. No pneumoperitoneum.   No pathologic distention of small bowel. Normal appendix. Prostate  gland and urinary bladder are unremarkable in appearance.  SKELETON  Adjacent to the inferior aspect of the right scapula there is a  focus of increased metabolic activity (SUVmax = 4.1), without a  correlate on the CT portion of the examination. This may be  physiologic or related to recent muscular strain. No focal  hypermetabolic activity to suggest skeletal metastasis.  IMPRESSION:  1. 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 No definite evidence to suggest metastatic  disease to the neck, abdomen or pelvis. These findings are favored  to represent either T2a, N0, Mx disease, or T2a, N1, Mx disease  (i.e., either stage IA or IIA disease). Surgical consultation and  correlation with biopsy is recommended.  2. Small cluster of peribronchovascular micronodules in the  periphery of the right upper lobe with low level hypermetabolism in  this region, presumably of infectious or inflammatory etiology.  Attention on followup studies is recommended to ensure resolution.  3. Small focus of hypermetabolic activity adjacent to the inferior  aspect of the right scapula, without definite correlate on the CT  portion of the examination. This is nonspecific, but favored to be  benign, either physiologic or related to muscular strain.  4. Additional incidental findings, as above.      Assessment & Plan:  Squamous cell lung cancer Initiated chemo + XRT.   Compulsive tobacco user syndrome Now uses the eCig with no nicotine content. He has not had any spirometry, denies any dyspnea. I do not believe we need BD's right now. Will follow him peripherally, one year.

## 2013-11-11 NOTE — Assessment & Plan Note (Signed)
Initiated chemo + XRT.

## 2013-11-11 NOTE — Patient Instructions (Signed)
Continue your follow up with Drs [name] and Lisbeth Renshaw.  Follow with Dr Lamonte Sakai in 12 months or sooner if you have any problems

## 2013-11-11 NOTE — Assessment & Plan Note (Signed)
Now uses the eCig with no nicotine content. He has not had any spirometry, denies any dyspnea. I do not believe we need BD's right now. Will follow him peripherally, one year.

## 2013-11-12 ENCOUNTER — Ambulatory Visit: Payer: Medicare Other

## 2013-11-12 ENCOUNTER — Telehealth: Payer: Self-pay | Admitting: *Deleted

## 2013-11-12 ENCOUNTER — Ambulatory Visit
Admission: RE | Admit: 2013-11-12 | Discharge: 2013-11-12 | Disposition: A | Discharge status: Home / Self Care | Payer: Medicare Other | Source: Ambulatory Visit | Attending: Radiation Oncology | Admitting: Radiation Oncology

## 2013-11-12 NOTE — Telephone Encounter (Signed)
Spoke with Marco [name], caregiver for pt for post chemo follow up call.  Per Marco [name], pt is doing well.  Pt eating fine, drinking lots of water and decaf tea as tolerated.  No problems with nausea/vomiting;  Bowel and bladder function fine. No pain as per caregiver.  Pt has radiation treatment daily.  Marco [name] understood to call office with pt's new problems. Marco [name] stated she and pt had very good experience for pt's first chemo treatment.  Stated the staff were very courteous, and nice.

## 2013-11-13 ENCOUNTER — Ambulatory Visit: Admission: RE | Admit: 2013-11-13 | Payer: Medicare Other | Source: Ambulatory Visit | Admitting: Radiation Oncology

## 2013-11-13 ENCOUNTER — Encounter: Payer: Self-pay | Admitting: Radiation Oncology

## 2013-11-13 ENCOUNTER — Ambulatory Visit
Admission: RE | Admit: 2013-11-13 | Discharge: 2013-11-13 | Disposition: A | Discharge status: Home / Self Care | Payer: Medicare Other | Source: Ambulatory Visit | Attending: Radiation Oncology | Admitting: Radiation Oncology

## 2013-11-13 ENCOUNTER — Ambulatory Visit: Payer: Medicare Other

## 2013-11-13 VITALS — BP 126/88 | HR 86 | Temp 97.6°F | Ht 68.0 in | Wt 153.6 lb

## 2013-11-13 DIAGNOSIS — C3410 Malignant neoplasm of upper lobe, unspecified bronchus or lung: Secondary | ICD-10-CM

## 2013-11-13 MED ORDER — BIAFINE EX EMUL
CUTANEOUS | Status: DC | PRN
  Administered 2013-11-13: 18:00:00 via TOPICAL

## 2013-11-13 NOTE — Progress Notes (Signed)
   Department of Radiation Oncology  Phone:  [phone] Fax:        [phone]  Weekly Treatment Note    Name: Marco [name] Date: 11/13/2013 MRN: [id number] DOB: 01-Oct-1950   Current dose: 10 Gy  Current fraction: 5   MEDICATIONS: Current Outpatient Prescriptions  Medication Sig Dispense Refill  . bisacodyl (DULCOLAX) 5 MG EC tablet Take 5 mg by mouth.      . donepezil (ARICEPT) 10 MG tablet Take 10 mg by mouth.      . fluticasone (FLONASE) 50 MCG/ACT nasal spray Place 2 sprays into both nostrils daily.      Marland Kitchen levETIRAcetam (KEPPRA) 750 MG tablet Take 750 mg by mouth 2 (two) times daily.      Marland Kitchen LORazepam (ATIVAN) 1 MG tablet Take 2 mg by mouth at bedtime.      Marland Kitchen omeprazole (PRILOSEC) 20 MG capsule Take 20 mg by mouth 2 (two) times daily.      . prochlorperazine (COMPAZINE) 10 MG tablet Take 1 tablet (10 mg total) by mouth every 6 (six) hours as needed for nausea or vomiting.  60 tablet  0  . risperiDONE (RISPERDAL) 3 MG tablet Take 3 mg by mouth at bedtime.      . traZODone (DESYREL) 50 MG tablet Take 25 mg by mouth at bedtime.       No current facility-administered medications for this encounter.     ALLERGIES: Review of patient's allergies indicates no known allergies.   LABORATORY DATA:  Lab Results  Component Value Date   WBC 7.1 11/10/2013   HGB 13.6 11/10/2013   HCT 41.9 11/10/2013   MCV 89.1 11/10/2013   PLT 162 11/10/2013   Lab Results  Component Value Date   NA 142 11/10/2013   K 3.8 11/10/2013   CL 104 09/10/2013   CO2 30* 11/10/2013   Lab Results  Component Value Date   ALT 9 11/10/2013   AST 9 11/10/2013   ALKPHOS 65 11/10/2013   BILITOT 0.33 11/10/2013     NARRATIVE: Marco [name] was seen today for weekly treatment management. The chart was checked and the patient's films were reviewed. The patient is doing well. No problems with treatment so far. Am pleased that he has been able to proceed with treatment without any substantial difficulties. He has been  extremely cooperative.  PHYSICAL EXAMINATION: height is 5\' 8"  (1.727 m) and weight is 153 lb 9.6 oz (69.673 kg). His temperature is 97.6 F (36.4 C). His blood pressure is 126/88 and his pulse is 86.        ASSESSMENT: The patient is doing satisfactorily with treatment.  PLAN: We will continue with the patient's radiation treatment as planned.

## 2013-11-13 NOTE — Progress Notes (Addendum)
Marco [name] in exam room 2.  He has received 5 treatments to his left lung.  He was accompanied by his caregiver, Willaim Sheng, today.  No voiced complaints and vital signs stable.  Education today with family member regarding radiation to the left lung encompassing the mediastinal area inclusive of management of pain(esophagitis), diet changes, fatigue, nausea/vomiting, skin changes (hyperpigmentation and dryness).  Also educated about checking for increasing signs of esophagitis which may impact his eating.  Educated to inspect his mouth frequently to assess for whitish areas in the mouth which may be signs of thrush since he is receiving Taxol/Carbo and may have steroids a pre-medications.  Family stated understanding.  will refer to the [facility] dietician.  Given the Radiation Therapy and you Booklet.  Teach Back education.  Given Biafine with instructions to apply BID and at Bedtime since his treatment time is late afternoon.  Caregiver stated understanding.

## 2013-11-14 ENCOUNTER — Ambulatory Visit: Admission: RE | Admit: 2013-11-14 | Payer: Medicare Other | Source: Ambulatory Visit | Admitting: Radiation Oncology

## 2013-11-14 ENCOUNTER — Ambulatory Visit: Payer: Medicare Other

## 2013-11-14 ENCOUNTER — Ambulatory Visit
Admission: RE | Admit: 2013-11-14 | Discharge: 2013-11-14 | Disposition: A | Discharge status: Home / Self Care | Payer: Medicare Other | Source: Ambulatory Visit | Attending: Radiation Oncology | Admitting: Radiation Oncology

## 2013-11-17 ENCOUNTER — Telehealth: Payer: Self-pay | Admitting: Internal Medicine

## 2013-11-17 ENCOUNTER — Ambulatory Visit: Payer: Self-pay | Admitting: Internal Medicine

## 2013-11-17 ENCOUNTER — Ambulatory Visit (HOSPITAL_BASED_OUTPATIENT_CLINIC_OR_DEPARTMENT_OTHER): Payer: Medicare Other

## 2013-11-17 ENCOUNTER — Ambulatory Visit (HOSPITAL_BASED_OUTPATIENT_CLINIC_OR_DEPARTMENT_OTHER): Payer: Medicare Other | Admitting: Internal Medicine

## 2013-11-17 ENCOUNTER — Other Ambulatory Visit (HOSPITAL_BASED_OUTPATIENT_CLINIC_OR_DEPARTMENT_OTHER): Payer: Medicare Other

## 2013-11-17 ENCOUNTER — Encounter: Payer: Self-pay | Admitting: Internal Medicine

## 2013-11-17 ENCOUNTER — Ambulatory Visit: Payer: Medicare Other

## 2013-11-17 ENCOUNTER — Ambulatory Visit
Admission: RE | Admit: 2013-11-17 | Discharge: 2013-11-17 | Disposition: A | Discharge status: Home / Self Care | Payer: Medicare Other | Source: Ambulatory Visit | Attending: Radiation Oncology | Admitting: Radiation Oncology

## 2013-11-17 VITALS — BP 146/100 | HR 86 | Resp 19 | Ht 68.0 in | Wt 156.4 lb

## 2013-11-17 DIAGNOSIS — C3410 Malignant neoplasm of upper lobe, unspecified bronchus or lung: Secondary | ICD-10-CM

## 2013-11-17 DIAGNOSIS — Z5111 Encounter for antineoplastic chemotherapy: Secondary | ICD-10-CM

## 2013-11-17 DIAGNOSIS — C3492 Malignant neoplasm of unspecified part of left bronchus or lung: Secondary | ICD-10-CM

## 2013-11-17 DIAGNOSIS — C3490 Malignant neoplasm of unspecified part of unspecified bronchus or lung: Secondary | ICD-10-CM

## 2013-11-17 LAB — CBC WITH DIFFERENTIAL/PLATELET
BASO%: 1 % (ref 0.0–2.0)
Basophils Absolute: 0.1 10*3/uL (ref 0.0–0.1)
EOS%: 0.4 % (ref 0.0–7.0)
Eosinophils Absolute: 0 10*3/uL (ref 0.0–0.5)
HCT: 42.4 % (ref 38.4–49.9)
HGB: 13.9 g/dL (ref 13.0–17.1)
LYMPH%: 19.7 % (ref 14.0–49.0)
MCH: 29.4 pg (ref 27.2–33.4)
MCHC: 32.8 g/dL (ref 32.0–36.0)
MCV: 89.6 fL (ref 79.3–98.0)
MONO#: 0.6 10*3/uL (ref 0.1–0.9)
MONO%: 8.2 % (ref 0.0–14.0)
NEUT#: 5 10*3/uL (ref 1.5–6.5)
NEUT%: 70.7 % (ref 39.0–75.0)
Platelets: 175 10*3/uL (ref 140–400)
RBC: 4.73 10*6/uL (ref 4.20–5.82)
RDW: 13.8 % (ref 11.0–14.6)
WBC: 7.1 10*3/uL (ref 4.0–10.3)
lymph#: 1.4 10*3/uL (ref 0.9–3.3)

## 2013-11-17 LAB — COMPREHENSIVE METABOLIC PANEL (CC13)
ALK PHOS: 74 U/L (ref 40–150)
ALT: 12 U/L (ref 0–55)
AST: 13 U/L (ref 5–34)
Albumin: 4.1 g/dL (ref 3.5–5.0)
Anion Gap: 11 mEq/L (ref 3–11)
BUN: 14 mg/dL (ref 7.0–26.0)
CO2: 27 mEq/L (ref 22–29)
Calcium: 9.7 mg/dL (ref 8.4–10.4)
Chloride: 105 mEq/L (ref 98–109)
Creatinine: 1 mg/dL (ref 0.7–1.3)
Glucose: 100 mg/dl (ref 70–140)
Potassium: 4.4 mEq/L (ref 3.5–5.1)
SODIUM: 142 meq/L (ref 136–145)
TOTAL PROTEIN: 7.7 g/dL (ref 6.4–8.3)
Total Bilirubin: 0.45 mg/dL (ref 0.20–1.20)

## 2013-11-17 MED ORDER — SODIUM CHLORIDE 0.9 % IV SOLN
Freq: Once | INTRAVENOUS | Status: AC
  Administered 2013-11-17: 13:00:00 via INTRAVENOUS

## 2013-11-17 MED ORDER — DEXAMETHASONE SODIUM PHOSPHATE 20 MG/5ML IJ SOLN
20.0000 mg | Freq: Once | INTRAMUSCULAR | Status: AC
  Administered 2013-11-17: 20 mg via INTRAVENOUS

## 2013-11-17 MED ORDER — FAMOTIDINE IN NACL 20-0.9 MG/50ML-% IV SOLN
20.0000 mg | Freq: Once | INTRAVENOUS | Status: AC
  Administered 2013-11-17: 20 mg via INTRAVENOUS

## 2013-11-17 MED ORDER — ONDANSETRON 16 MG/50ML IVPB (CHCC)
16.0000 mg | Freq: Once | INTRAVENOUS | Status: AC
  Administered 2013-11-17: 16 mg via INTRAVENOUS

## 2013-11-17 MED ORDER — ONDANSETRON 16 MG/50ML IVPB (CHCC)
INTRAVENOUS | Status: AC
  Filled 2013-11-17: qty 16

## 2013-11-17 MED ORDER — DIPHENHYDRAMINE HCL 50 MG/ML IJ SOLN
50.0000 mg | Freq: Once | INTRAMUSCULAR | Status: AC
  Administered 2013-11-17: 50 mg via INTRAVENOUS

## 2013-11-17 MED ORDER — FAMOTIDINE IN NACL 20-0.9 MG/50ML-% IV SOLN
INTRAVENOUS | Status: AC
  Filled 2013-11-17: qty 50

## 2013-11-17 MED ORDER — DEXAMETHASONE SODIUM PHOSPHATE 20 MG/5ML IJ SOLN
INTRAMUSCULAR | Status: AC
  Filled 2013-11-17: qty 5

## 2013-11-17 MED ORDER — DIPHENHYDRAMINE HCL 50 MG/ML IJ SOLN
INTRAMUSCULAR | Status: AC
  Filled 2013-11-17: qty 1

## 2013-11-17 MED ORDER — SODIUM CHLORIDE 0.9 % IV SOLN
200.0000 mg | Freq: Once | INTRAVENOUS | Status: AC
  Administered 2013-11-17: 200 mg via INTRAVENOUS
  Filled 2013-11-17: qty 20

## 2013-11-17 MED ORDER — SODIUM CHLORIDE 0.9 % IV SOLN
45.0000 mg/m2 | Freq: Once | INTRAVENOUS | Status: AC
  Administered 2013-11-17: 84 mg via INTRAVENOUS
  Filled 2013-11-17: qty 14

## 2013-11-17 NOTE — Progress Notes (Signed)
[place] [phone]   Fax:([phone]) Revere L, MD [place] [address]  DIAGNOSIS: Stage IIA (T2a., N1, M0) non-small cell lung cancer, squamous cell carcinoma presented with central left upper lobe mass as well as hilar lymphadenopathy diagnosed in January of 2015.  PRIOR THERAPY: None.  CURRENT THERAPY: Concurrent chemoradiation with weekly carboplatin for AUC of 2 and paclitaxel 45 mg/M2, status post 1 cycle.  INTERVAL HISTORY: Marco [name] 63 y.o. male returns to the clinic today for followup visit accompanied by his sister. The patient related the first week of his concurrent chemoradiation fairly well with no significant adverse effects. He denied having any nausea or vomiting, no fever or chills. He denied having any chest pain, shortness of breath, cough or hemoptysis. He was 3 pounds since his last visit. The patient denied having any night sweats.  MEDICAL HISTORY: Past Medical History  Diagnosis Date  . Mental disorder     sczizophrenia;moderate retardation  . Depression   . DEMENTIA   . Hypertension     no medications, no documented history per caregiver at preadmission  . GERD (gastroesophageal reflux disease)   . Seizures   . Developmental disability     developmentaly delayed    ALLERGIES:  has No Known Allergies.  MEDICATIONS:  Current Outpatient Prescriptions  Medication Sig Dispense Refill  . bisacodyl (DULCOLAX) 5 MG EC tablet Take 5 mg by mouth.      . donepezil (ARICEPT) 10 MG tablet Take 10 mg by mouth.      . emollient (BIAFINE) cream Apply 1 application topically 2 (two) times daily.      . fluticasone (FLONASE) 50 MCG/ACT nasal spray Place 2 sprays into both nostrils daily.      Marland Kitchen levETIRAcetam (KEPPRA) 750 MG tablet Take 750 mg by mouth 2 (two) times daily.      Marland Kitchen LORazepam (ATIVAN) 1 MG tablet Take 2 mg by mouth at bedtime.      Marland Kitchen omeprazole  (PRILOSEC) 20 MG capsule Take 20 mg by mouth 2 (two) times daily.      . prochlorperazine (COMPAZINE) 10 MG tablet Take 1 tablet (10 mg total) by mouth every 6 (six) hours as needed for nausea or vomiting.  60 tablet  0  . risperiDONE (RISPERDAL) 3 MG tablet Take 3 mg by mouth at bedtime.      . traZODone (DESYREL) 50 MG tablet Take 25 mg by mouth at bedtime.       No current facility-administered medications for this visit.    SURGICAL HISTORY:  Past Surgical History  Procedure Laterality Date  . Cataract extraction w/phaco  07/12/2011    Procedure: CATARACT EXTRACTION PHACO AND INTRAOCULAR LENS PLACEMENT (IOC);  Surgeon: Adonis Brook;  Location: [facility];  Service: Ophthalmology;  Laterality: Left;  Marland Kitchen Eye surgery      L eye  . Video bronchoscopy Bilateral 10/01/2013    Procedure: VIDEO BRONCHOSCOPY WITHOUT FLUORO;  Surgeon: Collene Gobble, MD;  Location: [facility];  Service: Cardiopulmonary;  Laterality: Bilateral;    REVIEW OF SYSTEMS:  A comprehensive review of systems was negative.   PHYSICAL EXAMINATION: General appearance: alert, cooperative and no distress Head: Normocephalic, without obvious abnormality, atraumatic Neck: no adenopathy, no JVD, supple, symmetrical, trachea midline and thyroid not enlarged, symmetric, no tenderness/mass/nodules Lymph nodes: Cervical, supraclavicular, and axillary nodes normal. Resp: clear to auscultation bilaterally Back: symmetric, no curvature. ROM normal.  No CVA tenderness. Cardio: regular rate and rhythm, S1, S2 normal, no murmur, click, rub or gallop GI: soft, non-tender; bowel sounds normal; no masses,  no organomegaly Extremities: extremities normal, atraumatic, no cyanosis or edema  ECOG PERFORMANCE STATUS: 1 - Symptomatic but completely ambulatory  Blood pressure 146/100, pulse 86, resp. rate 19, height 5\' 8"  (1.727 m), weight 156 lb 6.4 oz (70.943 kg).  LABORATORY DATA: Lab Results  Component Value Date   WBC 7.1 11/17/2013   HGB  13.9 11/17/2013   HCT 42.4 11/17/2013   MCV 89.6 11/17/2013   PLT 175 11/17/2013      Chemistry      Component Value Date/Time   NA 142 11/10/2013 1108   NA 143 09/10/2013 0551   K 3.8 11/10/2013 1108   K 3.6* 09/10/2013 0551   CL 104 09/10/2013 0551   CO2 30* 11/10/2013 1108   CO2 21 09/10/2013 0551   BUN 16.5 11/10/2013 1108   BUN 17 09/10/2013 0551   CREATININE 0.9 11/10/2013 1108   CREATININE 0.90 09/10/2013 0551      Component Value Date/Time   CALCIUM 9.2 11/10/2013 1108   CALCIUM 9.3 09/10/2013 0551   ALKPHOS 65 11/10/2013 1108   ALKPHOS 64 09/09/2013 0415   AST 9 11/10/2013 1108   AST 56* 09/09/2013 0415   ALT 9 11/10/2013 1108   ALT 27 09/09/2013 0415   BILITOT 0.33 11/10/2013 1108   BILITOT 0.7 09/09/2013 0415       RADIOGRAPHIC STUDIES: No results found.  ASSESSMENT AND PLAN: This is a very pleasant 63 years old [place] American male with unresectable a stage IIA non-small cell lung cancer. He is currently undergoing a course of concurrent chemoradiation with weekly carboplatin and paclitaxel and tolerating it fairly well. I recommended for the patient to proceed with the second cycle of his treatment today as scheduled. He would come back for followup visit in 2 weeks for evaluation and management any adverse effect of his treatment. The patient voices understanding of current disease status and treatment options and is in agreement with the current care plan.  All questions were answered. The patient knows to call the clinic with any problems, questions or concerns. We can certainly see the patient much sooner if necessary.  Disclaimer: This note was dictated with voice recognition software. Similar sounding words can inadvertently be transcribed and may not be corrected upon review.

## 2013-11-17 NOTE — Telephone Encounter (Signed)
gv adn printed appt sched and avs forp t for March and aptil

## 2013-11-17 NOTE — Patient Instructions (Signed)
[facility] Discharge Instructions for Patients Receiving Chemotherapy  Today you received the following chemotherapy agents Taxol/Carboplatin To help prevent nausea and vomiting after your treatment, we encourage you to take your nausea medication as prescribed.  If you develop nausea and vomiting that is not controlled by your nausea medication, call the clinic.   BELOW ARE SYMPTOMS THAT SHOULD BE REPORTED IMMEDIATELY:  *FEVER GREATER THAN 100.5 F  *CHILLS WITH OR WITHOUT FEVER  NAUSEA AND VOMITING THAT IS NOT CONTROLLED WITH YOUR NAUSEA MEDICATION  *UNUSUAL SHORTNESS OF BREATH  *UNUSUAL BRUISING OR BLEEDING  TENDERNESS IN MOUTH AND THROAT WITH OR WITHOUT PRESENCE OF ULCERS  *URINARY PROBLEMS  *BOWEL PROBLEMS  UNUSUAL RASH Items with * indicate a potential emergency and should be followed up as soon as possible.  Feel free to call the clinic you have any questions or concerns. The clinic phone number is [phone].    

## 2013-11-18 ENCOUNTER — Ambulatory Visit: Payer: Medicare Other

## 2013-11-18 ENCOUNTER — Ambulatory Visit
Admission: RE | Admit: 2013-11-18 | Discharge: 2013-11-18 | Disposition: A | Discharge status: Home / Self Care | Payer: Medicare Other | Source: Ambulatory Visit | Attending: Radiation Oncology | Admitting: Radiation Oncology

## 2013-11-19 ENCOUNTER — Ambulatory Visit: Payer: Medicare Other

## 2013-11-19 ENCOUNTER — Ambulatory Visit
Admission: RE | Admit: 2013-11-19 | Discharge: 2013-11-19 | Disposition: A | Discharge status: Home / Self Care | Payer: Medicare Other | Source: Ambulatory Visit | Attending: Radiation Oncology | Admitting: Radiation Oncology

## 2013-11-20 ENCOUNTER — Ambulatory Visit: Payer: Medicare Other

## 2013-11-20 ENCOUNTER — Ambulatory Visit
Admission: RE | Admit: 2013-11-20 | Discharge: 2013-11-20 | Disposition: A | Discharge status: Home / Self Care | Payer: Medicare Other | Source: Ambulatory Visit | Attending: Radiation Oncology | Admitting: Radiation Oncology

## 2013-11-20 VITALS — BP 123/83 | HR 96 | Temp 97.8°F | Wt 163.3 lb

## 2013-11-20 DIAGNOSIS — C3490 Malignant neoplasm of unspecified part of unspecified bronchus or lung: Secondary | ICD-10-CM

## 2013-11-20 NOTE — Progress Notes (Signed)
   Department of Radiation Oncology  Phone:  [phone] Fax:        [phone]  Weekly Treatment Note    Name: Marco [name] Date: 11/20/2013 MRN: [id number] DOB: 1951/08/14   Current dose: 20 Gy  Current fraction: 10   MEDICATIONS: Current Outpatient Prescriptions  Medication Sig Dispense Refill  . bisacodyl (DULCOLAX) 5 MG EC tablet Take 5 mg by mouth.      . donepezil (ARICEPT) 10 MG tablet Take 10 mg by mouth.      . emollient (BIAFINE) cream Apply 1 application topically 2 (two) times daily.      . fluticasone (FLONASE) 50 MCG/ACT nasal spray Place 2 sprays into both nostrils daily.      Marland Kitchen levETIRAcetam (KEPPRA) 750 MG tablet Take 750 mg by mouth 2 (two) times daily.      Marland Kitchen LORazepam (ATIVAN) 1 MG tablet Take 2 mg by mouth at bedtime.      Marland Kitchen omeprazole (PRILOSEC) 20 MG capsule Take 20 mg by mouth 2 (two) times daily.      . prochlorperazine (COMPAZINE) 10 MG tablet Take 1 tablet (10 mg total) by mouth every 6 (six) hours as needed for nausea or vomiting.  60 tablet  0  . risperiDONE (RISPERDAL) 3 MG tablet Take 3 mg by mouth at bedtime.      . traZODone (DESYREL) 50 MG tablet Take 25 mg by mouth at bedtime.       No current facility-administered medications for this encounter.     ALLERGIES: Review of patient's allergies indicates no known allergies.   LABORATORY DATA:  Lab Results  Component Value Date   WBC 7.1 11/17/2013   HGB 13.9 11/17/2013   HCT 42.4 11/17/2013   MCV 89.6 11/17/2013   PLT 175 11/17/2013   Lab Results  Component Value Date   NA 142 11/17/2013   K 4.4 11/17/2013   CL 104 09/10/2013   CO2 27 11/17/2013   Lab Results  Component Value Date   ALT 12 11/17/2013   AST 13 11/17/2013   ALKPHOS 74 11/17/2013   BILITOT 0.45 11/17/2013     NARRATIVE: Marco [name] was seen today for weekly treatment management. The chart was checked and the patient's films were reviewed. The patient is doing very well. No problems with treatment so far. No  evidence of him having difficulties with esophagitis.   PHYSICAL EXAMINATION: weight is 163 lb 4.8 oz (74.072 kg). His temperature is 97.8 F (36.6 C). His blood pressure is 123/83 and his pulse is 96. His oxygen saturation is 100%.        ASSESSMENT: The patient is doing satisfactorily with treatment.  PLAN: We will continue with the patient's radiation treatment as planned.

## 2013-11-21 ENCOUNTER — Ambulatory Visit
Admission: RE | Admit: 2013-11-21 | Discharge: 2013-11-21 | Disposition: A | Discharge status: Home / Self Care | Payer: Medicare Other | Source: Ambulatory Visit | Attending: Radiation Oncology | Admitting: Radiation Oncology

## 2013-11-21 ENCOUNTER — Ambulatory Visit: Payer: Medicare Other

## 2013-11-21 DIAGNOSIS — C3490 Malignant neoplasm of unspecified part of unspecified bronchus or lung: Secondary | ICD-10-CM

## 2013-11-21 NOTE — Progress Notes (Signed)
   Department of Radiation Oncology  Phone:  [phone] Fax:        [phone]  Weekly Treatment Note    Name: Marco [name] Date: 11/21/2013 MRN: [id number] DOB: [birth year]   Current dose: 22 Gy  Current fraction: 11   MEDICATIONS: Current Outpatient Prescriptions  Medication Sig Dispense Refill  . bisacodyl (DULCOLAX) 5 MG EC tablet Take 5 mg by mouth.      . donepezil (ARICEPT) 10 MG tablet Take 10 mg by mouth.      . emollient (BIAFINE) cream Apply 1 application topically 2 (two) times daily.      . fluticasone (FLONASE) 50 MCG/ACT nasal spray Place 2 sprays into both nostrils daily.      Marland Kitchen levETIRAcetam (KEPPRA) 750 MG tablet Take 750 mg by mouth 2 (two) times daily.      Marland Kitchen LORazepam (ATIVAN) 1 MG tablet Take 2 mg by mouth at bedtime.      Marland Kitchen omeprazole (PRILOSEC) 20 MG capsule Take 20 mg by mouth 2 (two) times daily.      . prochlorperazine (COMPAZINE) 10 MG tablet Take 1 tablet (10 mg total) by mouth every 6 (six) hours as needed for nausea or vomiting.  60 tablet  0  . risperiDONE (RISPERDAL) 3 MG tablet Take 3 mg by mouth at bedtime.      . traZODone (DESYREL) 50 MG tablet Take 25 mg by mouth at bedtime.       No current facility-administered medications for this encounter.     ALLERGIES: Review of patient's allergies indicates no known allergies.   LABORATORY DATA:  Lab Results  Component Value Date   WBC 7.1 11/17/2013   HGB 13.9 11/17/2013   HCT 42.4 11/17/2013   MCV 89.6 11/17/2013   PLT 175 11/17/2013   Lab Results  Component Value Date   NA 142 11/17/2013   K 4.4 11/17/2013   CL 104 09/10/2013   CO2 27 11/17/2013   Lab Results  Component Value Date   ALT 12 11/17/2013   AST 13 11/17/2013   ALKPHOS 74 11/17/2013   BILITOT 0.45 11/17/2013     NARRATIVE: Marco [name] was seen today for weekly treatment management. The chart was checked and the patient's films were reviewed. The patient has continued to do well. No new problems. No difficulties with  treatment and the patient has been very cooperative.  PHYSICAL EXAMINATION: Mild skin irritation/change   ASSESSMENT: The patient is doing satisfactorily with treatment.  PLAN: We will continue with the patient's radiation treatment as planned.

## 2013-11-24 ENCOUNTER — Ambulatory Visit
Admission: RE | Admit: 2013-11-24 | Discharge: 2013-11-24 | Disposition: A | Discharge status: Home / Self Care | Payer: Medicare Other | Source: Ambulatory Visit | Attending: Radiation Oncology | Admitting: Radiation Oncology

## 2013-11-24 ENCOUNTER — Ambulatory Visit (HOSPITAL_BASED_OUTPATIENT_CLINIC_OR_DEPARTMENT_OTHER): Payer: Medicare Other

## 2013-11-24 ENCOUNTER — Other Ambulatory Visit: Payer: Self-pay | Admitting: Internal Medicine

## 2013-11-24 ENCOUNTER — Other Ambulatory Visit (HOSPITAL_BASED_OUTPATIENT_CLINIC_OR_DEPARTMENT_OTHER): Payer: Medicare Other

## 2013-11-24 ENCOUNTER — Ambulatory Visit: Payer: Medicare Other

## 2013-11-24 VITALS — BP 121/82 | HR 94 | Temp 97.8°F | Resp 18

## 2013-11-24 DIAGNOSIS — C3490 Malignant neoplasm of unspecified part of unspecified bronchus or lung: Secondary | ICD-10-CM

## 2013-11-24 DIAGNOSIS — Z5111 Encounter for antineoplastic chemotherapy: Secondary | ICD-10-CM

## 2013-11-24 DIAGNOSIS — C3492 Malignant neoplasm of unspecified part of left bronchus or lung: Secondary | ICD-10-CM

## 2013-11-24 DIAGNOSIS — C3410 Malignant neoplasm of upper lobe, unspecified bronchus or lung: Secondary | ICD-10-CM

## 2013-11-24 LAB — COMPREHENSIVE METABOLIC PANEL (CC13)
ALBUMIN: 3.6 g/dL (ref 3.5–5.0)
ALT: 21 U/L (ref 0–55)
ANION GAP: 12 meq/L — AB (ref 3–11)
AST: 13 U/L (ref 5–34)
Alkaline Phosphatase: 72 U/L (ref 40–150)
BUN: 17.7 mg/dL (ref 7.0–26.0)
CO2: 25 meq/L (ref 22–29)
Calcium: 9.5 mg/dL (ref 8.4–10.4)
Chloride: 105 mEq/L (ref 98–109)
Creatinine: 0.8 mg/dL (ref 0.7–1.3)
GLUCOSE: 96 mg/dL (ref 70–140)
Potassium: 3.8 mEq/L (ref 3.5–5.1)
Sodium: 142 mEq/L (ref 136–145)
Total Bilirubin: 0.33 mg/dL (ref 0.20–1.20)
Total Protein: 7.3 g/dL (ref 6.4–8.3)

## 2013-11-24 LAB — CBC WITH DIFFERENTIAL/PLATELET
BASO%: 0.6 % (ref 0.0–2.0)
BASOS ABS: 0 10*3/uL (ref 0.0–0.1)
EOS%: 0.6 % (ref 0.0–7.0)
Eosinophils Absolute: 0 10*3/uL (ref 0.0–0.5)
HEMATOCRIT: 41.8 % (ref 38.4–49.9)
HEMOGLOBIN: 13.7 g/dL (ref 13.0–17.1)
LYMPH#: 0.9 10*3/uL (ref 0.9–3.3)
LYMPH%: 14.8 % (ref 14.0–49.0)
MCH: 29.5 pg (ref 27.2–33.4)
MCHC: 32.8 g/dL (ref 32.0–36.0)
MCV: 90.1 fL (ref 79.3–98.0)
MONO#: 0.6 10*3/uL (ref 0.1–0.9)
MONO%: 9.6 % (ref 0.0–14.0)
NEUT#: 4.6 10*3/uL (ref 1.5–6.5)
NEUT%: 74.4 % (ref 39.0–75.0)
Platelets: 200 10*3/uL (ref 140–400)
RBC: 4.64 10*6/uL (ref 4.20–5.82)
RDW: 14.1 % (ref 11.0–14.6)
WBC: 6.2 10*3/uL (ref 4.0–10.3)
nRBC: 0 % (ref 0–0)

## 2013-11-24 MED ORDER — DEXAMETHASONE SODIUM PHOSPHATE 20 MG/5ML IJ SOLN
20.0000 mg | Freq: Once | INTRAMUSCULAR | Status: AC
  Administered 2013-11-24: 20 mg via INTRAVENOUS

## 2013-11-24 MED ORDER — ONDANSETRON 16 MG/50ML IVPB (CHCC)
INTRAVENOUS | Status: AC
  Filled 2013-11-24: qty 16

## 2013-11-24 MED ORDER — FAMOTIDINE IN NACL 20-0.9 MG/50ML-% IV SOLN
20.0000 mg | Freq: Once | INTRAVENOUS | Status: AC
  Administered 2013-11-24: 20 mg via INTRAVENOUS

## 2013-11-24 MED ORDER — SODIUM CHLORIDE 0.9 % IV SOLN
200.0000 mg | Freq: Once | INTRAVENOUS | Status: AC
  Administered 2013-11-24: 200 mg via INTRAVENOUS
  Filled 2013-11-24: qty 20

## 2013-11-24 MED ORDER — DEXAMETHASONE SODIUM PHOSPHATE 20 MG/5ML IJ SOLN
INTRAMUSCULAR | Status: AC
  Filled 2013-11-24: qty 5

## 2013-11-24 MED ORDER — DIPHENHYDRAMINE HCL 50 MG/ML IJ SOLN
INTRAMUSCULAR | Status: AC
  Filled 2013-11-24: qty 1

## 2013-11-24 MED ORDER — SODIUM CHLORIDE 0.9 % IV SOLN
Freq: Once | INTRAVENOUS | Status: AC
  Administered 2013-11-24: 12:00:00 via INTRAVENOUS

## 2013-11-24 MED ORDER — FAMOTIDINE IN NACL 20-0.9 MG/50ML-% IV SOLN
INTRAVENOUS | Status: AC
  Filled 2013-11-24: qty 50

## 2013-11-24 MED ORDER — ONDANSETRON 16 MG/50ML IVPB (CHCC)
16.0000 mg | Freq: Once | INTRAVENOUS | Status: AC
  Administered 2013-11-24: 16 mg via INTRAVENOUS

## 2013-11-24 MED ORDER — DIPHENHYDRAMINE HCL 50 MG/ML IJ SOLN
50.0000 mg | Freq: Once | INTRAMUSCULAR | Status: AC
  Administered 2013-11-24: 50 mg via INTRAVENOUS

## 2013-11-24 MED ORDER — SODIUM CHLORIDE 0.9 % IV SOLN
45.0000 mg/m2 | Freq: Once | INTRAVENOUS | Status: AC
  Administered 2013-11-24: 84 mg via INTRAVENOUS
  Filled 2013-11-24: qty 14

## 2013-11-24 NOTE — Patient Instructions (Signed)
[facility] Discharge Instructions for Patients Receiving Chemotherapy  Today you received the following chemotherapy agents: Taxol and Carboplatin.  To help prevent nausea and vomiting after your treatment, we encourage you to take your nausea medication as prescribed.   If you develop nausea and vomiting that is not controlled by your nausea medication, call the clinic.   BELOW ARE SYMPTOMS THAT SHOULD BE REPORTED IMMEDIATELY:  *FEVER GREATER THAN 100.5 F  *CHILLS WITH OR WITHOUT FEVER  NAUSEA AND VOMITING THAT IS NOT CONTROLLED WITH YOUR NAUSEA MEDICATION  *UNUSUAL SHORTNESS OF BREATH  *UNUSUAL BRUISING OR BLEEDING  TENDERNESS IN MOUTH AND THROAT WITH OR WITHOUT PRESENCE OF ULCERS  *URINARY PROBLEMS  *BOWEL PROBLEMS  UNUSUAL RASH Items with * indicate a potential emergency and should be followed up as soon as possible.  Feel free to call the clinic you have any questions or concerns. The clinic phone number is [phone].    

## 2013-11-25 ENCOUNTER — Ambulatory Visit
Admission: RE | Admit: 2013-11-25 | Discharge: 2013-11-25 | Disposition: A | Discharge status: Home / Self Care | Payer: Medicare Other | Source: Ambulatory Visit | Attending: Radiation Oncology | Admitting: Radiation Oncology

## 2013-11-25 ENCOUNTER — Ambulatory Visit: Payer: Medicare Other

## 2013-11-26 ENCOUNTER — Telehealth: Payer: Self-pay | Admitting: *Deleted

## 2013-11-26 ENCOUNTER — Ambulatory Visit
Admission: RE | Admit: 2013-11-26 | Discharge: 2013-11-26 | Disposition: A | Discharge status: Home / Self Care | Payer: Medicare Other | Source: Ambulatory Visit | Attending: Radiation Oncology | Admitting: Radiation Oncology

## 2013-11-26 ENCOUNTER — Ambulatory Visit: Payer: Medicare Other

## 2013-11-26 NOTE — Telephone Encounter (Signed)
Called patient home, no answer, called  Ell phone spoke with POA, asked if she knew if patient was in the ER ,message from [place], [place]  stated they got a phone call that he was in ER, POA stated she was going to the house now to bring patient for his rad tx and will call back and let us know status Was wrong patient similar name that was sent to ED, POa bringing patient for his treatment, called  [facility] and informed Elmyra Ricks RT pateint is on his way here 3:24 PM

## 2013-11-27 ENCOUNTER — Ambulatory Visit
Admission: RE | Admit: 2013-11-27 | Discharge: 2013-11-27 | Disposition: A | Discharge status: Home / Self Care | Payer: Medicare Other | Source: Ambulatory Visit | Attending: Radiation Oncology | Admitting: Radiation Oncology

## 2013-11-27 ENCOUNTER — Ambulatory Visit: Payer: Medicare Other

## 2013-11-28 ENCOUNTER — Ambulatory Visit
Admission: RE | Admit: 2013-11-28 | Discharge: 2013-11-28 | Disposition: A | Discharge status: Home / Self Care | Payer: Medicare Other | Source: Ambulatory Visit | Attending: Radiation Oncology | Admitting: Radiation Oncology

## 2013-11-28 ENCOUNTER — Ambulatory Visit: Payer: Medicare Other

## 2013-11-28 ENCOUNTER — Encounter: Payer: Self-pay | Admitting: Radiation Oncology

## 2013-11-28 VITALS — BP 117/83 | HR 90 | Temp 97.8°F | Resp 20 | Wt 162.0 lb

## 2013-11-28 DIAGNOSIS — C3490 Malignant neoplasm of unspecified part of unspecified bronchus or lung: Secondary | ICD-10-CM

## 2013-11-28 NOTE — Progress Notes (Signed)
Weekly rad txs, 16/30 left lung, no c/o coughing, sob,nausea, difficulty swallowing, appetite is good, no c/opain 11:29 AM

## 2013-11-28 NOTE — Progress Notes (Signed)
   Department of Radiation Oncology  Phone:  [phone] Fax:        [phone]  Weekly Treatment Note    Name: Marco [name] Date: 11/28/2013 MRN: [id number] DOB: [birth year]   Current dose: 32 Gy  Current fraction: 16   MEDICATIONS: Current Outpatient Prescriptions  Medication Sig Dispense Refill  . bisacodyl (DULCOLAX) 5 MG EC tablet Take 5 mg by mouth.      . donepezil (ARICEPT) 10 MG tablet Take 10 mg by mouth.      . emollient (BIAFINE) cream Apply 1 application topically 2 (two) times daily.      . fluticasone (FLONASE) 50 MCG/ACT nasal spray Place 2 sprays into both nostrils daily.      Marland Kitchen levETIRAcetam (KEPPRA) 750 MG tablet Take 750 mg by mouth 2 (two) times daily.      Marland Kitchen LORazepam (ATIVAN) 1 MG tablet Take 2 mg by mouth at bedtime.      Marland Kitchen omeprazole (PRILOSEC) 20 MG capsule Take 20 mg by mouth 2 (two) times daily.      . prochlorperazine (COMPAZINE) 10 MG tablet Take 1 tablet (10 mg total) by mouth every 6 (six) hours as needed for nausea or vomiting.  60 tablet  0  . risperiDONE (RISPERDAL) 3 MG tablet Take 3 mg by mouth at bedtime.      . traZODone (DESYREL) 50 MG tablet Take 25 mg by mouth at bedtime.       No current facility-administered medications for this encounter.     ALLERGIES: Review of patient's allergies indicates no known allergies.   LABORATORY DATA:  Lab Results  Component Value Date   WBC 6.2 11/24/2013   HGB 13.7 11/24/2013   HCT 41.8 11/24/2013   MCV 90.1 11/24/2013   PLT 200 11/24/2013   Lab Results  Component Value Date   NA 142 11/24/2013   K 3.8 11/24/2013   CL 104 09/10/2013   CO2 25 11/24/2013   Lab Results  Component Value Date   ALT 21 11/24/2013   AST 13 11/24/2013   ALKPHOS 72 11/24/2013   BILITOT 0.33 11/24/2013     NARRATIVE: Marco [name] was seen today for weekly treatment management. The chart was checked and the patient's films were reviewed. The patient is doing well he states. The patient denies any esophagitis.  His appetite has been good. No difficulty with swallowing.  PHYSICAL EXAMINATION: weight is 162 lb (73.483 kg). His oral temperature is 97.8 F (36.6 C). His blood pressure is 117/83 and his pulse is 90. His respiration is 20 and oxygen saturation is 99%.        ASSESSMENT: The patient is doing satisfactorily with treatment.  PLAN: We will continue with the patient's radiation treatment as planned.

## 2013-12-01 ENCOUNTER — Other Ambulatory Visit: Payer: Self-pay

## 2013-12-01 ENCOUNTER — Other Ambulatory Visit (HOSPITAL_BASED_OUTPATIENT_CLINIC_OR_DEPARTMENT_OTHER): Payer: Medicare Other

## 2013-12-01 ENCOUNTER — Ambulatory Visit: Payer: Medicare Other

## 2013-12-01 ENCOUNTER — Ambulatory Visit (HOSPITAL_BASED_OUTPATIENT_CLINIC_OR_DEPARTMENT_OTHER): Payer: Medicare Other

## 2013-12-01 ENCOUNTER — Encounter: Payer: Self-pay | Admitting: Physician Assistant

## 2013-12-01 ENCOUNTER — Ambulatory Visit
Admission: RE | Admit: 2013-12-01 | Discharge: 2013-12-01 | Disposition: A | Discharge status: Home / Self Care | Payer: Medicare Other | Source: Ambulatory Visit | Attending: Radiation Oncology | Admitting: Radiation Oncology

## 2013-12-01 ENCOUNTER — Telehealth: Payer: Self-pay | Admitting: Internal Medicine

## 2013-12-01 ENCOUNTER — Ambulatory Visit (HOSPITAL_BASED_OUTPATIENT_CLINIC_OR_DEPARTMENT_OTHER): Payer: Medicare Other | Admitting: Physician Assistant

## 2013-12-01 VITALS — BP 111/86 | HR 92 | Temp 97.0°F | Resp 20 | Ht 68.0 in | Wt 154.9 lb

## 2013-12-01 DIAGNOSIS — Z5111 Encounter for antineoplastic chemotherapy: Secondary | ICD-10-CM

## 2013-12-01 DIAGNOSIS — J029 Acute pharyngitis, unspecified: Secondary | ICD-10-CM

## 2013-12-01 DIAGNOSIS — C3492 Malignant neoplasm of unspecified part of left bronchus or lung: Secondary | ICD-10-CM

## 2013-12-01 DIAGNOSIS — C3490 Malignant neoplasm of unspecified part of unspecified bronchus or lung: Secondary | ICD-10-CM

## 2013-12-01 DIAGNOSIS — C3410 Malignant neoplasm of upper lobe, unspecified bronchus or lung: Secondary | ICD-10-CM

## 2013-12-01 LAB — COMPREHENSIVE METABOLIC PANEL (CC13)
ALT: 9 U/L (ref 0–55)
ANION GAP: 12 meq/L — AB (ref 3–11)
AST: 11 U/L (ref 5–34)
Albumin: 4.2 g/dL (ref 3.5–5.0)
Alkaline Phosphatase: 77 U/L (ref 40–150)
BUN: 21.7 mg/dL (ref 7.0–26.0)
CALCIUM: 10.3 mg/dL (ref 8.4–10.4)
CHLORIDE: 107 meq/L (ref 98–109)
CO2: 25 meq/L (ref 22–29)
CREATININE: 0.9 mg/dL (ref 0.7–1.3)
Glucose: 82 mg/dl (ref 70–140)
Potassium: 4.6 mEq/L (ref 3.5–5.1)
Sodium: 144 mEq/L (ref 136–145)
Total Bilirubin: 0.6 mg/dL (ref 0.20–1.20)
Total Protein: 8.1 g/dL (ref 6.4–8.3)

## 2013-12-01 LAB — CBC WITH DIFFERENTIAL/PLATELET
BASO%: 0.5 % (ref 0.0–2.0)
Basophils Absolute: 0 10*3/uL (ref 0.0–0.1)
EOS ABS: 0 10*3/uL (ref 0.0–0.5)
EOS%: 0.3 % (ref 0.0–7.0)
HEMATOCRIT: 43.5 % (ref 38.4–49.9)
HGB: 14.4 g/dL (ref 13.0–17.1)
LYMPH%: 13.1 % — AB (ref 14.0–49.0)
MCH: 29.1 pg (ref 27.2–33.4)
MCHC: 33.1 g/dL (ref 32.0–36.0)
MCV: 87.9 fL (ref 79.3–98.0)
MONO#: 0.5 10*3/uL (ref 0.1–0.9)
MONO%: 8 % (ref 0.0–14.0)
NEUT%: 78.1 % — AB (ref 39.0–75.0)
NEUTROS ABS: 5 10*3/uL (ref 1.5–6.5)
PLATELETS: 235 10*3/uL (ref 140–400)
RBC: 4.95 10*6/uL (ref 4.20–5.82)
RDW: 14.3 % (ref 11.0–14.6)
WBC: 6.4 10*3/uL (ref 4.0–10.3)
lymph#: 0.8 10*3/uL — ABNORMAL LOW (ref 0.9–3.3)
nRBC: 0 % (ref 0–0)

## 2013-12-01 MED ORDER — ONDANSETRON 16 MG/50ML IVPB (CHCC)
INTRAVENOUS | Status: AC
  Filled 2013-12-01: qty 16

## 2013-12-01 MED ORDER — FAMOTIDINE IN NACL 20-0.9 MG/50ML-% IV SOLN
INTRAVENOUS | Status: AC
  Filled 2013-12-01: qty 50

## 2013-12-01 MED ORDER — SODIUM CHLORIDE 0.9 % IV SOLN
200.0000 mg | Freq: Once | INTRAVENOUS | Status: AC
  Administered 2013-12-01: 200 mg via INTRAVENOUS
  Filled 2013-12-01: qty 20

## 2013-12-01 MED ORDER — SODIUM CHLORIDE 0.9 % IV SOLN
45.0000 mg/m2 | Freq: Once | INTRAVENOUS | Status: AC
  Administered 2013-12-01: 84 mg via INTRAVENOUS
  Filled 2013-12-01: qty 14

## 2013-12-01 MED ORDER — FAMOTIDINE IN NACL 20-0.9 MG/50ML-% IV SOLN
20.0000 mg | Freq: Once | INTRAVENOUS | Status: AC
  Administered 2013-12-01: 20 mg via INTRAVENOUS

## 2013-12-01 MED ORDER — DEXAMETHASONE SODIUM PHOSPHATE 20 MG/5ML IJ SOLN
INTRAMUSCULAR | Status: AC
  Filled 2013-12-01: qty 5

## 2013-12-01 MED ORDER — DIPHENHYDRAMINE HCL 50 MG/ML IJ SOLN
50.0000 mg | Freq: Once | INTRAMUSCULAR | Status: AC
  Administered 2013-12-01: 50 mg via INTRAVENOUS

## 2013-12-01 MED ORDER — DEXAMETHASONE SODIUM PHOSPHATE 20 MG/5ML IJ SOLN
20.0000 mg | Freq: Once | INTRAMUSCULAR | Status: AC
  Administered 2013-12-01: 20 mg via INTRAVENOUS

## 2013-12-01 MED ORDER — ONDANSETRON 16 MG/50ML IVPB (CHCC)
16.0000 mg | Freq: Once | INTRAVENOUS | Status: AC
  Administered 2013-12-01: 16 mg via INTRAVENOUS

## 2013-12-01 MED ORDER — SODIUM CHLORIDE 0.9 % IV SOLN
Freq: Once | INTRAVENOUS | Status: AC
  Administered 2013-12-01: 12:00:00 via INTRAVENOUS

## 2013-12-01 MED ORDER — DIPHENHYDRAMINE HCL 50 MG/ML IJ SOLN
INTRAMUSCULAR | Status: AC
  Filled 2013-12-01: qty 1

## 2013-12-01 NOTE — Progress Notes (Addendum)
[place] [phone]   Fax:([phone]  SHARED VISIT PROGRESS NOTE  [name],[name], MD [place] [address]  DIAGNOSIS: Stage IIA (T2a., N1, M0) non-small cell lung cancer, squamous cell carcinoma presented with central left upper lobe mass as well as hilar lymphadenopathy diagnosed in January of 2015.  PRIOR THERAPY: None.  CURRENT THERAPY: Concurrent chemoradiation with weekly carboplatin for AUC of 2 and paclitaxel 45 mg/M2, status post 3 cycles.  INTERVAL HISTORY: Marco [name] 63 y.o. male returns to the clinic today for followup visit accompanied by his sister. He is tolerating his course of concurrent chemoradiation relatively well. He reports occasional cough. His shortness breath at baseline. His appetite is good although he has lost some weight since his last visit, approximately 7 pounds.  He denied having any nausea or vomiting, no fever or chills. He denied having any chest pain, shortness of breath, cough or hemoptysis. He was 3 pounds since his last visit. The patient denied having any night sweats.  MEDICAL HISTORY: Past Medical History  Diagnosis Date  . Mental disorder     sczizophrenia;moderate retardation  . Depression   . DEMENTIA   . Hypertension     no medications, no documented history per caregiver at preadmission  . GERD (gastroesophageal reflux disease)   . Seizures   . Developmental disability     developmentaly delayed    ALLERGIES:  has No Known Allergies.  MEDICATIONS:  Current Outpatient Prescriptions  Medication Sig Dispense Refill  . bisacodyl (DULCOLAX) 5 MG EC tablet Take 5 mg by mouth.      . donepezil (ARICEPT) 10 MG tablet Take 10 mg by mouth.      . emollient (BIAFINE) cream Apply 1 application topically 2 (two) times daily.      . fluticasone (FLONASE) 50 MCG/ACT nasal spray Place 2 sprays into both nostrils daily.      Marland Kitchen levETIRAcetam (KEPPRA) 750 MG tablet Take 750 mg  by mouth 2 (two) times daily.      Marland Kitchen LORazepam (ATIVAN) 1 MG tablet Take 2 mg by mouth at bedtime.      Marland Kitchen omeprazole (PRILOSEC) 20 MG capsule Take 20 mg by mouth 2 (two) times daily.      . prochlorperazine (COMPAZINE) 10 MG tablet Take 1 tablet (10 mg total) by mouth every 6 (six) hours as needed for nausea or vomiting.  60 tablet  0  . risperiDONE (RISPERDAL) 3 MG tablet Take 3 mg by mouth at bedtime.      . traZODone (DESYREL) 50 MG tablet Take 25 mg by mouth at bedtime.       No current facility-administered medications for this visit.    SURGICAL HISTORY:  Past Surgical History  Procedure Laterality Date  . Cataract extraction w/phaco  07/12/2011    Procedure: CATARACT EXTRACTION PHACO AND INTRAOCULAR LENS PLACEMENT (IOC);  Surgeon: Adonis Brook;  Location: [facility];  Service: Ophthalmology;  Laterality: Left;  Marland Kitchen Eye surgery      L eye  . Video bronchoscopy Bilateral 10/01/2013    Procedure: VIDEO BRONCHOSCOPY WITHOUT FLUORO;  Surgeon: Collene Gobble, MD;  Location: [facility];  Service: Cardiopulmonary;  Laterality: Bilateral;    REVIEW OF SYSTEMS:  A comprehensive review of systems was negative except for: Constitutional: positive for weight loss   PHYSICAL EXAMINATION: General appearance: alert, cooperative and no distress Head: Normocephalic, without obvious abnormality, atraumatic Neck: no adenopathy, no JVD, supple, symmetrical,  trachea midline and thyroid not enlarged, symmetric, no tenderness/mass/nodules Lymph nodes: Cervical, supraclavicular, and axillary nodes normal. Resp: clear to auscultation bilaterally Back: symmetric, no curvature. ROM normal. No CVA tenderness. Cardio: regular rate and rhythm, S1, S2 normal, no murmur, click, rub or gallop GI: soft, non-tender; bowel sounds normal; no masses,  no organomegaly Extremities: extremities normal, atraumatic, no cyanosis or edema  ECOG PERFORMANCE STATUS: 1 - Symptomatic but completely ambulatory  Blood pressure  111/86, pulse 92, temperature 97 F (36.1 C), temperature source Oral, resp. rate 20, height 5\' 8"  (1.727 m), weight 154 lb 14.4 oz (70.262 kg), SpO2 100.00%.  LABORATORY DATA: Lab Results  Component Value Date   WBC 6.4 12/01/2013   HGB 14.4 12/01/2013   HCT 43.5 12/01/2013   MCV 87.9 12/01/2013   PLT 235 12/01/2013      Chemistry      Component Value Date/Time   NA 142 11/24/2013 1123   NA 143 09/10/2013 0551   K 3.8 11/24/2013 1123   K 3.6* 09/10/2013 0551   CL 104 09/10/2013 0551   CO2 25 11/24/2013 1123   CO2 21 09/10/2013 0551   BUN 17.7 11/24/2013 1123   BUN 17 09/10/2013 0551   CREATININE 0.8 11/24/2013 1123   CREATININE 0.90 09/10/2013 0551      Component Value Date/Time   CALCIUM 9.5 11/24/2013 1123   CALCIUM 9.3 09/10/2013 0551   ALKPHOS 72 11/24/2013 1123   ALKPHOS 64 09/09/2013 0415   AST 13 11/24/2013 1123   AST 56* 09/09/2013 0415   ALT 21 11/24/2013 1123   ALT 27 09/09/2013 0415   BILITOT 0.33 11/24/2013 1123   BILITOT 0.7 09/09/2013 0415       RADIOGRAPHIC STUDIES: No results found.  ASSESSMENT AND PLAN: This is a very pleasant 63 years old [place] American male with unresectable a stage IIA non-small cell lung cancer. He is currently undergoing a course of concurrent chemoradiation with weekly carboplatin and paclitaxel and tolerating it fairly well. Patient was discussed with also seen by Dr. [name]. He was encouraged to increase his by mouth intake. He'll continue his course of concurrent chemoradiation as scheduled and followup with Dr. [name] in 2 weeks for another symptom management visit.   The patient voices understanding of current disease status and treatment options and is in agreement with the current care plan.  All questions were answered. The patient knows to call the clinic with any problems, questions or concerns. We can certainly see the patient much sooner if necessary.  Carlton Adam PA-C ADDENDUM: Hematology/Oncology Attending: I had a face to face  encounter with the patient today. I recommended his care plan. This is a very pleasant 63 years old [place] American male with history of unresectable a stage II a non-small cell lung cancer, squamous cell carcinoma and he is currently undergoing a course of concurrent chemoradiation with weekly carboplatin and paclitaxel is status post 3 cycles. He is tolerating his treatment fairly well except for mild sore throat. He is currently on Carafate. We will proceed with cycle #4 today as scheduled. The patient would come back for followup visit in 2 weeks for reevaluation and management any adverse effect of his treatment.  He was advised to call immediately if he has any concerning symptoms in the interval.  Disclaimer: This note was dictated with voice recognition software. Similar sounding words can inadvertently be transcribed and may not be corrected upon review. Eilleen Kempf., MD 12/01/2013

## 2013-12-01 NOTE — Patient Instructions (Signed)
[facility] Discharge Instructions for Patients Receiving Chemotherapy  Today you received the following chemotherapy agents: Taxol, Carboplatin   To help prevent nausea and vomiting after your treatment, we encourage you to take your nausea medication as prescribed.    If you develop nausea and vomiting that is not controlled by your nausea medication, call the clinic.   BELOW ARE SYMPTOMS THAT SHOULD BE REPORTED IMMEDIATELY:  *FEVER GREATER THAN 100.5 F  *CHILLS WITH OR WITHOUT FEVER  NAUSEA AND VOMITING THAT IS NOT CONTROLLED WITH YOUR NAUSEA MEDICATION  *UNUSUAL SHORTNESS OF BREATH  *UNUSUAL BRUISING OR BLEEDING  TENDERNESS IN MOUTH AND THROAT WITH OR WITHOUT PRESENCE OF ULCERS  *URINARY PROBLEMS  *BOWEL PROBLEMS  UNUSUAL RASH Items with * indicate a potential emergency and should be followed up as soon as possible.  Feel free to call the clinic you have any questions or concerns. The clinic phone number is [phone].

## 2013-12-01 NOTE — Telephone Encounter (Signed)
[facility] ADN PRINTED APPT SCHED AND AVS FOR PT FOR mARCH ADN APRIL....SED ADJUSTED TX.

## 2013-12-01 NOTE — Patient Instructions (Signed)
Continue with your course of concurrent chemotherapy/radiation therapy as scheduled Follow up in 2 weeks

## 2013-12-02 ENCOUNTER — Ambulatory Visit
Admission: RE | Admit: 2013-12-02 | Discharge: 2013-12-02 | Disposition: A | Discharge status: Home / Self Care | Payer: Medicare Other | Source: Ambulatory Visit | Attending: Radiation Oncology | Admitting: Radiation Oncology

## 2013-12-02 ENCOUNTER — Ambulatory Visit: Payer: Medicare Other

## 2013-12-03 ENCOUNTER — Ambulatory Visit
Admission: RE | Admit: 2013-12-03 | Discharge: 2013-12-03 | Disposition: A | Discharge status: Home / Self Care | Payer: Medicare Other | Source: Ambulatory Visit | Attending: Radiation Oncology | Admitting: Radiation Oncology

## 2013-12-03 ENCOUNTER — Encounter: Payer: Self-pay | Admitting: Radiation Oncology

## 2013-12-03 ENCOUNTER — Ambulatory Visit: Payer: Medicare Other

## 2013-12-03 VITALS — BP 117/81 | HR 99 | Temp 97.4°F | Resp 20 | Wt 161.0 lb

## 2013-12-03 DIAGNOSIS — C3490 Malignant neoplasm of unspecified part of unspecified bronchus or lung: Secondary | ICD-10-CM

## 2013-12-03 NOTE — Progress Notes (Signed)
  Radiation Oncology         [phone]) [phone] ________________________________  Name: Marco [name] MRN: [id number]  Date: 12/03/2013  DOB: September 27, 1950  Weekly Radiation Therapy Management  Current Dose: 38 Gy     Planned Dose:  60 Gy  Narrative . . . . . . . . The patient presents for routine under treatment assessment.                                   The patient is without complaint.                                  Set-up films were reviewed.                                 The chart was checked. Physical Findings. . .  weight is 161 lb (73.029 kg). His oral temperature is 97.4 F (36.3 C). His blood pressure is 117/81 and his pulse is 99. His respiration is 20. . Weight essentially stable.  No significant changes. The lungs are clear. The heart has a regular rhythm and rate Impression . . . . . . . The patient is tolerating radiation. Plan . . . . . . . . . . . . Continue treatment as planned.  ________________________________   Blair Promise, PhD, MD

## 2013-12-03 NOTE — Progress Notes (Signed)
Pt denies pain, cough, SOB, fatigue, loss of appetite, difficulty swallowing. Biafine being applied to pt's left lung treatment area; pt denies skin irritation.

## 2013-12-04 ENCOUNTER — Ambulatory Visit: Payer: Medicare Other

## 2013-12-04 ENCOUNTER — Ambulatory Visit
Admission: RE | Admit: 2013-12-04 | Discharge: 2013-12-04 | Disposition: A | Discharge status: Home / Self Care | Payer: Medicare Other | Source: Ambulatory Visit | Attending: Radiation Oncology | Admitting: Radiation Oncology

## 2013-12-04 ENCOUNTER — Encounter: Payer: Self-pay | Admitting: Radiation Oncology

## 2013-12-04 NOTE — Progress Notes (Signed)
  Radiation Oncology         [phone]) [phone] ________________________________  Name: Marco [name] MRN: [id number]  Date: 12/04/2013  DOB: 01/22/1951  Simulation Verification Note  Status: In patient   NARRATIVE: The patient was brought to the treatment unit and placed in the planned treatment position. The clinical setup was verified. Then port films were obtained and uploaded to the radiation oncology medical record software.  The treatment beams were carefully compared against the planned radiation fields. The position location and shape of the radiation fields was reviewed. They targeted volume of tissue appears to be appropriately covered by the radiation beams. Organs at risk appear to be excluded as planned.  Based on my personal review, I approved the simulation verification. The patient's treatment will proceed as planned.  -----------------------------------  Blair Promise, PhD, MD

## 2013-12-05 ENCOUNTER — Ambulatory Visit: Payer: Medicare Other

## 2013-12-05 ENCOUNTER — Ambulatory Visit
Admission: RE | Admit: 2013-12-05 | Discharge: 2013-12-05 | Disposition: A | Discharge status: Home / Self Care | Payer: Medicare Other | Source: Ambulatory Visit | Attending: Radiation Oncology | Admitting: Radiation Oncology

## 2013-12-08 ENCOUNTER — Other Ambulatory Visit (HOSPITAL_BASED_OUTPATIENT_CLINIC_OR_DEPARTMENT_OTHER): Payer: Medicare Other

## 2013-12-08 ENCOUNTER — Ambulatory Visit (HOSPITAL_BASED_OUTPATIENT_CLINIC_OR_DEPARTMENT_OTHER): Payer: Medicare Other

## 2013-12-08 ENCOUNTER — Ambulatory Visit: Payer: Medicare Other

## 2013-12-08 ENCOUNTER — Ambulatory Visit
Admission: RE | Admit: 2013-12-08 | Discharge: 2013-12-08 | Disposition: A | Discharge status: Home / Self Care | Payer: Medicare Other | Source: Ambulatory Visit | Attending: Radiation Oncology | Admitting: Radiation Oncology

## 2013-12-08 VITALS — BP 108/72 | HR 95 | Temp 98.2°F | Resp 18

## 2013-12-08 DIAGNOSIS — C3410 Malignant neoplasm of upper lobe, unspecified bronchus or lung: Secondary | ICD-10-CM

## 2013-12-08 DIAGNOSIS — C3492 Malignant neoplasm of unspecified part of left bronchus or lung: Secondary | ICD-10-CM

## 2013-12-08 DIAGNOSIS — Z5111 Encounter for antineoplastic chemotherapy: Secondary | ICD-10-CM

## 2013-12-08 DIAGNOSIS — C3490 Malignant neoplasm of unspecified part of unspecified bronchus or lung: Secondary | ICD-10-CM

## 2013-12-08 LAB — CBC WITH DIFFERENTIAL/PLATELET
BASO%: 0.6 % (ref 0.0–2.0)
Basophils Absolute: 0 10*3/uL (ref 0.0–0.1)
EOS%: 0.5 % (ref 0.0–7.0)
Eosinophils Absolute: 0 10*3/uL (ref 0.0–0.5)
HCT: 39.1 % (ref 38.4–49.9)
HGB: 13 g/dL (ref 13.0–17.1)
LYMPH#: 0.7 10*3/uL — AB (ref 0.9–3.3)
LYMPH%: 11.8 % — ABNORMAL LOW (ref 14.0–49.0)
MCH: 29.4 pg (ref 27.2–33.4)
MCHC: 33.3 g/dL (ref 32.0–36.0)
MCV: 88.5 fL (ref 79.3–98.0)
MONO#: 0.5 10*3/uL (ref 0.1–0.9)
MONO%: 8.1 % (ref 0.0–14.0)
NEUT#: 4.4 10*3/uL (ref 1.5–6.5)
NEUT%: 79 % — ABNORMAL HIGH (ref 39.0–75.0)
Platelets: 184 10*3/uL (ref 140–400)
RBC: 4.42 10*6/uL (ref 4.20–5.82)
RDW: 14.6 % (ref 11.0–14.6)
WBC: 5.6 10*3/uL (ref 4.0–10.3)

## 2013-12-08 LAB — COMPREHENSIVE METABOLIC PANEL (CC13)
ALT: 14 U/L (ref 0–55)
ANION GAP: 10 meq/L (ref 3–11)
AST: 13 U/L (ref 5–34)
Albumin: 3.7 g/dL (ref 3.5–5.0)
Alkaline Phosphatase: 67 U/L (ref 40–150)
BUN: 16.6 mg/dL (ref 7.0–26.0)
CALCIUM: 9 mg/dL (ref 8.4–10.4)
CHLORIDE: 108 meq/L (ref 98–109)
CO2: 26 meq/L (ref 22–29)
Creatinine: 0.9 mg/dL (ref 0.7–1.3)
Glucose: 86 mg/dl (ref 70–140)
Potassium: 3.8 mEq/L (ref 3.5–5.1)
SODIUM: 144 meq/L (ref 136–145)
Total Bilirubin: 0.27 mg/dL (ref 0.20–1.20)
Total Protein: 6.9 g/dL (ref 6.4–8.3)

## 2013-12-08 MED ORDER — SODIUM CHLORIDE 0.9 % IV SOLN
45.0000 mg/m2 | Freq: Once | INTRAVENOUS | Status: AC
  Administered 2013-12-08: 84 mg via INTRAVENOUS
  Filled 2013-12-08: qty 14

## 2013-12-08 MED ORDER — FAMOTIDINE IN NACL 20-0.9 MG/50ML-% IV SOLN
20.0000 mg | Freq: Once | INTRAVENOUS | Status: AC
  Administered 2013-12-08: 20 mg via INTRAVENOUS

## 2013-12-08 MED ORDER — SODIUM CHLORIDE 0.9 % IV SOLN
200.0000 mg | Freq: Once | INTRAVENOUS | Status: AC
  Administered 2013-12-08: 200 mg via INTRAVENOUS
  Filled 2013-12-08: qty 20

## 2013-12-08 MED ORDER — SODIUM CHLORIDE 0.9 % IV SOLN
Freq: Once | INTRAVENOUS | Status: AC
  Administered 2013-12-08: 12:00:00 via INTRAVENOUS

## 2013-12-08 MED ORDER — DEXAMETHASONE SODIUM PHOSPHATE 20 MG/5ML IJ SOLN
20.0000 mg | Freq: Once | INTRAMUSCULAR | Status: AC
  Administered 2013-12-08: 20 mg via INTRAVENOUS

## 2013-12-08 MED ORDER — DIPHENHYDRAMINE HCL 50 MG/ML IJ SOLN
INTRAMUSCULAR | Status: AC
  Filled 2013-12-08: qty 1

## 2013-12-08 MED ORDER — FAMOTIDINE IN NACL 20-0.9 MG/50ML-% IV SOLN
INTRAVENOUS | Status: AC
  Filled 2013-12-08: qty 50

## 2013-12-08 MED ORDER — DEXAMETHASONE SODIUM PHOSPHATE 20 MG/5ML IJ SOLN
INTRAMUSCULAR | Status: AC
  Filled 2013-12-08: qty 5

## 2013-12-08 MED ORDER — DIPHENHYDRAMINE HCL 50 MG/ML IJ SOLN
50.0000 mg | Freq: Once | INTRAMUSCULAR | Status: AC
  Administered 2013-12-08: 50 mg via INTRAVENOUS

## 2013-12-08 MED ORDER — ONDANSETRON 16 MG/50ML IVPB (CHCC)
INTRAVENOUS | Status: AC
  Filled 2013-12-08: qty 16

## 2013-12-08 MED ORDER — ONDANSETRON 16 MG/50ML IVPB (CHCC)
16.0000 mg | Freq: Once | INTRAVENOUS | Status: AC
  Administered 2013-12-08: 16 mg via INTRAVENOUS

## 2013-12-08 NOTE — Patient Instructions (Signed)
[facility] Discharge Instructions for Patients Receiving Chemotherapy  Today you received the following chemotherapy agents: Carboplatin, Taxol  To help prevent nausea and vomiting after your treatment, we encourage you to take your nausea medication as prescribed.    If you develop nausea and vomiting that is not controlled by your nausea medication, call the clinic.   BELOW ARE SYMPTOMS THAT SHOULD BE REPORTED IMMEDIATELY:  *FEVER GREATER THAN 100.5 F  *CHILLS WITH OR WITHOUT FEVER  NAUSEA AND VOMITING THAT IS NOT CONTROLLED WITH YOUR NAUSEA MEDICATION  *UNUSUAL SHORTNESS OF BREATH  *UNUSUAL BRUISING OR BLEEDING  TENDERNESS IN MOUTH AND THROAT WITH OR WITHOUT PRESENCE OF ULCERS  *URINARY PROBLEMS  *BOWEL PROBLEMS  UNUSUAL RASH Items with * indicate a potential emergency and should be followed up as soon as possible.  Feel free to call the clinic you have any questions or concerns. The clinic phone number is [phone].

## 2013-12-09 ENCOUNTER — Ambulatory Visit
Admission: RE | Admit: 2013-12-09 | Discharge: 2013-12-09 | Disposition: A | Discharge status: Home / Self Care | Payer: Medicare Other | Source: Ambulatory Visit | Attending: Radiation Oncology | Admitting: Radiation Oncology

## 2013-12-09 ENCOUNTER — Ambulatory Visit: Payer: Medicare Other

## 2013-12-10 ENCOUNTER — Ambulatory Visit: Payer: Medicare Other

## 2013-12-10 ENCOUNTER — Ambulatory Visit
Admission: RE | Admit: 2013-12-10 | Discharge: 2013-12-10 | Disposition: A | Discharge status: Home / Self Care | Payer: Medicare Other | Source: Ambulatory Visit | Attending: Radiation Oncology | Admitting: Radiation Oncology

## 2013-12-11 ENCOUNTER — Encounter: Payer: Self-pay | Admitting: Radiation Oncology

## 2013-12-11 ENCOUNTER — Ambulatory Visit
Admission: RE | Admit: 2013-12-11 | Discharge: 2013-12-11 | Disposition: A | Discharge status: Home / Self Care | Payer: Medicare Other | Source: Ambulatory Visit | Attending: Radiation Oncology | Admitting: Radiation Oncology

## 2013-12-11 ENCOUNTER — Ambulatory Visit: Payer: Medicare Other

## 2013-12-11 VITALS — BP 121/85 | HR 103 | Temp 98.2°F | Ht 68.0 in | Wt 149.1 lb

## 2013-12-11 DIAGNOSIS — C3490 Malignant neoplasm of unspecified part of unspecified bronchus or lung: Secondary | ICD-10-CM

## 2013-12-11 NOTE — Progress Notes (Signed)
Marco [name] has completed 25 fractions to his left lung.  He denies any pain nor SOB.  He admits to fatigue and does not sleep well.  He does not nap any time during the day.   O2 sat 100% on Room Air.

## 2013-12-11 NOTE — Progress Notes (Signed)
   Department of Radiation Oncology  Phone:  [phone] Fax:        [phone]  Weekly Treatment Note    Name: [name] Date: 12/11/2013 MRN: [id number] DOB: 11/25/1950   Current dose: 50. Gy  Current fraction: 25   MEDICATIONS: Current Outpatient Prescriptions  Medication Sig Dispense Refill  . bisacodyl (DULCOLAX) 5 MG EC tablet Take 5 mg by mouth.      . donepezil (ARICEPT) 10 MG tablet Take 10 mg by mouth.      . emollient (BIAFINE) cream Apply 1 application topically 2 (two) times daily.      . fluticasone (FLONASE) 50 MCG/ACT nasal spray Place 2 sprays into both nostrils daily.      Marland Kitchen levETIRAcetam (KEPPRA) 750 MG tablet Take 750 mg by mouth 2 (two) times daily.      Marland Kitchen LORazepam (ATIVAN) 1 MG tablet Take 2 mg by mouth at bedtime.      Marland Kitchen omeprazole (PRILOSEC) 20 MG capsule Take 20 mg by mouth 2 (two) times daily.      . prochlorperazine (COMPAZINE) 10 MG tablet Take 1 tablet (10 mg total) by mouth every 6 (six) hours as needed for nausea or vomiting.  60 tablet  0  . risperiDONE (RISPERDAL) 3 MG tablet Take 3 mg by mouth at bedtime.      . traZODone (DESYREL) 50 MG tablet Take 25 mg by mouth at bedtime.       No current facility-administered medications for this encounter.     ALLERGIES: Review of patient's allergies indicates no known allergies.   LABORATORY DATA:  Lab Results  Component Value Date   WBC 5.6 12/08/2013   HGB 13.0 12/08/2013   HCT 39.1 12/08/2013   MCV 88.5 12/08/2013   PLT 184 12/08/2013   Lab Results  Component Value Date   NA 144 12/08/2013   K 3.8 12/08/2013   CL 104 09/10/2013   CO2 26 12/08/2013   Lab Results  Component Value Date   ALT 14 12/08/2013   AST 13 12/08/2013   ALKPHOS 67 12/08/2013   BILITOT 0.27 12/08/2013     NARRATIVE: Marco [name] was seen today for weekly treatment management. The chart was checked and the patient's films were reviewed. The patient is doing very well from a symptomatic standpoint. He is not expressing any  difficulty swallowing, no esophagitis. No change in shortness of breath or pain.  PHYSICAL EXAMINATION: height is 5\' 8"  (1.727 m) and weight is 149 lb 1.6 oz (67.631 kg). His temperature is 98.2 F (36.8 C). His blood pressure is 121/85 and his pulse is 103.        ASSESSMENT: The patient is doing satisfactorily with treatment.  PLAN: We will continue with the patient's radiation treatment as planned.        ejm

## 2013-12-12 ENCOUNTER — Ambulatory Visit: Payer: Medicare Other

## 2013-12-15 ENCOUNTER — Ambulatory Visit (HOSPITAL_BASED_OUTPATIENT_CLINIC_OR_DEPARTMENT_OTHER): Payer: Medicare Other

## 2013-12-15 ENCOUNTER — Other Ambulatory Visit: Payer: Self-pay

## 2013-12-15 ENCOUNTER — Ambulatory Visit: Payer: Self-pay

## 2013-12-15 ENCOUNTER — Other Ambulatory Visit (HOSPITAL_BASED_OUTPATIENT_CLINIC_OR_DEPARTMENT_OTHER): Payer: Medicare Other

## 2013-12-15 ENCOUNTER — Ambulatory Visit: Payer: Medicare Other

## 2013-12-15 ENCOUNTER — Ambulatory Visit
Admission: RE | Admit: 2013-12-15 | Discharge: 2013-12-15 | Disposition: A | Discharge status: Home / Self Care | Payer: Medicare Other | Source: Ambulatory Visit | Attending: Radiation Oncology | Admitting: Radiation Oncology

## 2013-12-15 ENCOUNTER — Encounter: Payer: Self-pay | Admitting: Internal Medicine

## 2013-12-15 ENCOUNTER — Ambulatory Visit (HOSPITAL_BASED_OUTPATIENT_CLINIC_OR_DEPARTMENT_OTHER): Payer: Medicare Other | Admitting: Internal Medicine

## 2013-12-15 VITALS — BP 112/76 | HR 97 | Temp 97.4°F | Resp 18 | Ht 68.0 in | Wt 152.6 lb

## 2013-12-15 VITALS — BP 108/72 | HR 88

## 2013-12-15 DIAGNOSIS — C3492 Malignant neoplasm of unspecified part of left bronchus or lung: Secondary | ICD-10-CM

## 2013-12-15 DIAGNOSIS — C3490 Malignant neoplasm of unspecified part of unspecified bronchus or lung: Secondary | ICD-10-CM

## 2013-12-15 DIAGNOSIS — Z5111 Encounter for antineoplastic chemotherapy: Secondary | ICD-10-CM

## 2013-12-15 DIAGNOSIS — C3410 Malignant neoplasm of upper lobe, unspecified bronchus or lung: Secondary | ICD-10-CM

## 2013-12-15 DIAGNOSIS — I10 Essential (primary) hypertension: Secondary | ICD-10-CM

## 2013-12-15 LAB — CBC WITH DIFFERENTIAL/PLATELET
BASO%: 0.5 % (ref 0.0–2.0)
BASOS ABS: 0 10*3/uL (ref 0.0–0.1)
EOS%: 0.1 % (ref 0.0–7.0)
Eosinophils Absolute: 0 10*3/uL (ref 0.0–0.5)
HCT: 41.4 % (ref 38.4–49.9)
HEMOGLOBIN: 14 g/dL (ref 13.0–17.1)
LYMPH#: 0.4 10*3/uL — AB (ref 0.9–3.3)
LYMPH%: 6.8 % — ABNORMAL LOW (ref 14.0–49.0)
MCH: 30 pg (ref 27.2–33.4)
MCHC: 33.7 g/dL (ref 32.0–36.0)
MCV: 89 fL (ref 79.3–98.0)
MONO#: 0.4 10*3/uL (ref 0.1–0.9)
MONO%: 6.9 % (ref 0.0–14.0)
NEUT#: 5 10*3/uL (ref 1.5–6.5)
NEUT%: 85.7 % — ABNORMAL HIGH (ref 39.0–75.0)
Platelets: 154 10*3/uL (ref 140–400)
RBC: 4.65 10*6/uL (ref 4.20–5.82)
RDW: 15.5 % — AB (ref 11.0–14.6)
WBC: 5.8 10*3/uL (ref 4.0–10.3)

## 2013-12-15 LAB — COMPREHENSIVE METABOLIC PANEL (CC13)
ALBUMIN: 3.9 g/dL (ref 3.5–5.0)
ALT: 9 U/L (ref 0–55)
AST: 11 U/L (ref 5–34)
Alkaline Phosphatase: 67 U/L (ref 40–150)
Anion Gap: 10 mEq/L (ref 3–11)
BUN: 18.4 mg/dL (ref 7.0–26.0)
CHLORIDE: 106 meq/L (ref 98–109)
CO2: 26 mEq/L (ref 22–29)
Calcium: 9.7 mg/dL (ref 8.4–10.4)
Creatinine: 1.1 mg/dL (ref 0.7–1.3)
Glucose: 124 mg/dl (ref 70–140)
POTASSIUM: 4 meq/L (ref 3.5–5.1)
Sodium: 142 mEq/L (ref 136–145)
Total Bilirubin: 0.67 mg/dL (ref 0.20–1.20)
Total Protein: 7.3 g/dL (ref 6.4–8.3)

## 2013-12-15 MED ORDER — DIPHENHYDRAMINE HCL 50 MG/ML IJ SOLN
50.0000 mg | Freq: Once | INTRAMUSCULAR | Status: AC
  Administered 2013-12-15: 50 mg via INTRAVENOUS

## 2013-12-15 MED ORDER — SODIUM CHLORIDE 0.9 % IV SOLN
45.0000 mg/m2 | Freq: Once | INTRAVENOUS | Status: AC
  Administered 2013-12-15: 84 mg via INTRAVENOUS
  Filled 2013-12-15: qty 14

## 2013-12-15 MED ORDER — SODIUM CHLORIDE 0.9 % IV SOLN
Freq: Once | INTRAVENOUS | Status: AC
  Administered 2013-12-15: 09:00:00 via INTRAVENOUS

## 2013-12-15 MED ORDER — ONDANSETRON 16 MG/50ML IVPB (CHCC)
INTRAVENOUS | Status: AC
  Filled 2013-12-15: qty 16

## 2013-12-15 MED ORDER — FAMOTIDINE IN NACL 20-0.9 MG/50ML-% IV SOLN
INTRAVENOUS | Status: AC
  Filled 2013-12-15: qty 50

## 2013-12-15 MED ORDER — DEXAMETHASONE SODIUM PHOSPHATE 20 MG/5ML IJ SOLN
20.0000 mg | Freq: Once | INTRAMUSCULAR | Status: AC
  Administered 2013-12-15: 20 mg via INTRAVENOUS

## 2013-12-15 MED ORDER — DEXAMETHASONE SODIUM PHOSPHATE 20 MG/5ML IJ SOLN
INTRAMUSCULAR | Status: AC
  Filled 2013-12-15: qty 5

## 2013-12-15 MED ORDER — SODIUM CHLORIDE 0.9 % IV SOLN
200.0000 mg | Freq: Once | INTRAVENOUS | Status: AC
  Administered 2013-12-15: 200 mg via INTRAVENOUS
  Filled 2013-12-15: qty 20

## 2013-12-15 MED ORDER — FAMOTIDINE IN NACL 20-0.9 MG/50ML-% IV SOLN
20.0000 mg | Freq: Once | INTRAVENOUS | Status: AC
  Administered 2013-12-15: 20 mg via INTRAVENOUS

## 2013-12-15 MED ORDER — ONDANSETRON 16 MG/50ML IVPB (CHCC)
16.0000 mg | Freq: Once | INTRAVENOUS | Status: AC
  Administered 2013-12-15: 16 mg via INTRAVENOUS

## 2013-12-15 MED ORDER — DIPHENHYDRAMINE HCL 50 MG/ML IJ SOLN
INTRAMUSCULAR | Status: AC
  Filled 2013-12-15: qty 1

## 2013-12-15 NOTE — Patient Instructions (Signed)
[facility] Discharge Instructions for Patients Receiving Chemotherapy  Today you received the following chemotherapy agents:  Taxol and Carboplatin  To help prevent nausea and vomiting after your treatment, we encourage you to take your nausea medication as ordered per MD.   If you develop nausea and vomiting that is not controlled by your nausea medication, call the clinic.   BELOW ARE SYMPTOMS THAT SHOULD BE REPORTED IMMEDIATELY:  *FEVER GREATER THAN 100.5 F  *CHILLS WITH OR WITHOUT FEVER  NAUSEA AND VOMITING THAT IS NOT CONTROLLED WITH YOUR NAUSEA MEDICATION  *UNUSUAL SHORTNESS OF BREATH  *UNUSUAL BRUISING OR BLEEDING  TENDERNESS IN MOUTH AND THROAT WITH OR WITHOUT PRESENCE OF ULCERS  *URINARY PROBLEMS  *BOWEL PROBLEMS  UNUSUAL RASH Items with * indicate a potential emergency and should be followed up as soon as possible.  Feel free to call the clinic you have any questions or concerns. The clinic phone number is [phone].    

## 2013-12-15 NOTE — Progress Notes (Signed)
[facility] Telephone:(336) [phone]   Fax:(336) [place] L, MD [place] [phone]  DIAGNOSIS: Stage IIA (T2a., N1, M0) non-small cell lung cancer, squamous cell carcinoma presented with central left upper lobe mass as well as hilar lymphadenopathy diagnosed in January of 2015.  PRIOR THERAPY: None.  CURRENT THERAPY: Concurrent chemoradiation with weekly carboplatin for AUC of 2 and paclitaxel 45 mg/M2, status post 5 cycles.  INTERVAL HISTORY: Marco [name] 63 y.o. male returns to the clinic today for followup visit accompanied by his sister. The patient related the first week of his concurrent chemoradiation fairly well with no significant adverse effects. He denied having any nausea or vomiting, no fever or chills. He denied having any chest pain, shortness of breath, cough or hemoptysis. He was 3 pounds since his last visit. The patient denied having any night sweats.  MEDICAL HISTORY: Past Medical History  Diagnosis Date  . Mental disorder     sczizophrenia;moderate retardation  . Depression   . DEMENTIA   . Hypertension     no medications, no documented history per caregiver at preadmission  . GERD (gastroesophageal reflux disease)   . Seizures   . Developmental disability     developmentaly delayed    ALLERGIES:  has No Known Allergies.  MEDICATIONS:  Current Outpatient Prescriptions  Medication Sig Dispense Refill  . bisacodyl (DULCOLAX) 5 MG EC tablet Take 5 mg by mouth.      . donepezil (ARICEPT) 10 MG tablet Take 10 mg by mouth.      . emollient (BIAFINE) cream Apply 1 application topically 2 (two) times daily.      . fluticasone (FLONASE) 50 MCG/ACT nasal spray Place 2 sprays into both nostrils daily.      Marland Kitchen levETIRAcetam (KEPPRA) 750 MG tablet Take 750 mg by mouth 2 (two) times daily.      Marland Kitchen LORazepam (ATIVAN) 1 MG tablet Take 2 mg by mouth at bedtime.      Marland Kitchen omeprazole  (PRILOSEC) 20 MG capsule Take 20 mg by mouth 2 (two) times daily.      . prochlorperazine (COMPAZINE) 10 MG tablet Take 1 tablet (10 mg total) by mouth every 6 (six) hours as needed for nausea or vomiting.  60 tablet  0  . risperiDONE (RISPERDAL) 3 MG tablet Take 3 mg by mouth at bedtime.      . traZODone (DESYREL) 50 MG tablet Take 25 mg by mouth at bedtime.       No current facility-administered medications for this visit.    SURGICAL HISTORY:  Past Surgical History  Procedure Laterality Date  . Cataract extraction w/phaco  07/12/2011    Procedure: CATARACT EXTRACTION PHACO AND INTRAOCULAR LENS PLACEMENT (IOC);  Surgeon: Adonis Brook;  Location: [facility];  Service: Ophthalmology;  Laterality: Left;  Marland Kitchen Eye surgery      L eye  . Video bronchoscopy Bilateral 10/01/2013    Procedure: VIDEO BRONCHOSCOPY WITHOUT FLUORO;  Surgeon: Collene Gobble, MD;  Location: [facility];  Service: Cardiopulmonary;  Laterality: Bilateral;    REVIEW OF SYSTEMS:  A comprehensive review of systems was negative.   PHYSICAL EXAMINATION: General appearance: alert, cooperative and no distress Head: Normocephalic, without obvious abnormality, atraumatic Neck: no adenopathy, no JVD, supple, symmetrical, trachea midline and thyroid not enlarged, symmetric, no tenderness/mass/nodules Lymph nodes: Cervical, supraclavicular, and axillary nodes normal. Resp: clear to auscultation bilaterally Back: symmetric, no curvature. ROM normal.  No CVA tenderness. Cardio: regular rate and rhythm, S1, S2 normal, no murmur, click, rub or gallop GI: soft, non-tender; bowel sounds normal; no masses,  no organomegaly Extremities: extremities normal, atraumatic, no cyanosis or edema  ECOG PERFORMANCE STATUS: 1 - Symptomatic but completely ambulatory  Blood pressure 112/76, pulse 97, temperature 97.4 F (36.3 C), temperature source Oral, resp. rate 18, height 5\' 8"  (1.727 m), weight 152 lb 9.6 oz (69.219 kg), SpO2 100.00%.  LABORATORY  DATA: Lab Results  Component Value Date   WBC 5.8 12/15/2013   HGB 14.0 12/15/2013   HCT 41.4 12/15/2013   MCV 89.0 12/15/2013   PLT 154 12/15/2013      Chemistry      Component Value Date/Time   NA 144 12/08/2013 1114   NA 143 09/10/2013 0551   K 3.8 12/08/2013 1114   K 3.6* 09/10/2013 0551   CL 104 09/10/2013 0551   CO2 26 12/08/2013 1114   CO2 21 09/10/2013 0551   BUN 16.6 12/08/2013 1114   BUN 17 09/10/2013 0551   CREATININE 0.9 12/08/2013 1114   CREATININE 0.90 09/10/2013 0551      Component Value Date/Time   CALCIUM 9.0 12/08/2013 1114   CALCIUM 9.3 09/10/2013 0551   ALKPHOS 67 12/08/2013 1114   ALKPHOS 64 09/09/2013 0415   AST 13 12/08/2013 1114   AST 56* 09/09/2013 0415   ALT 14 12/08/2013 1114   ALT 27 09/09/2013 0415   BILITOT 0.27 12/08/2013 1114   BILITOT 0.7 09/09/2013 0415       RADIOGRAPHIC STUDIES: No results found.  ASSESSMENT AND PLAN: This is a very pleasant 63 years old [place] American male with unresectable a stage IIA non-small cell lung cancer. He is currently undergoing a course of concurrent chemoradiation with weekly carboplatin and paclitaxel is status post 5 cycles and tolerating it fairly well.  I recommended for the patient to proceed with cycle #6 of his treatment today as scheduled. He would come back for followup visit in 6 weeks for evaluation after repeating CT scan of the chest for restaging of his disease. The patient voices understanding of current disease status and treatment options and is in agreement with the current care plan.  All questions were answered. The patient knows to call the clinic with any problems, questions or concerns. We can certainly see the patient much sooner if necessary.  Disclaimer: This note was dictated with voice recognition software. Similar sounding words can inadvertently be transcribed and may not be corrected upon review.

## 2013-12-16 ENCOUNTER — Ambulatory Visit
Admission: RE | Admit: 2013-12-16 | Discharge: 2013-12-16 | Disposition: A | Discharge status: Home / Self Care | Payer: Medicare Other | Source: Ambulatory Visit | Attending: Radiation Oncology | Admitting: Radiation Oncology

## 2013-12-16 ENCOUNTER — Ambulatory Visit: Payer: Medicare Other

## 2013-12-17 ENCOUNTER — Ambulatory Visit
Admission: RE | Admit: 2013-12-17 | Discharge: 2013-12-17 | Disposition: A | Discharge status: Home / Self Care | Payer: Medicare Other | Source: Ambulatory Visit | Attending: Radiation Oncology | Admitting: Radiation Oncology

## 2013-12-17 ENCOUNTER — Ambulatory Visit: Payer: Medicare Other

## 2013-12-17 ENCOUNTER — Telehealth: Payer: Self-pay | Admitting: Internal Medicine

## 2013-12-17 NOTE — Telephone Encounter (Signed)
s.w. healthcare provider and advised on appt.Marland KitchenMarland Kitchenpt will get new sched today.Marland KitchenMarland Kitchen

## 2013-12-18 ENCOUNTER — Ambulatory Visit
Admission: RE | Admit: 2013-12-18 | Discharge: 2013-12-18 | Disposition: A | Discharge status: Home / Self Care | Payer: Medicare Other | Source: Ambulatory Visit | Attending: Radiation Oncology | Admitting: Radiation Oncology

## 2013-12-18 ENCOUNTER — Ambulatory Visit: Payer: Medicare Other

## 2013-12-19 ENCOUNTER — Ambulatory Visit: Payer: Medicare Other

## 2013-12-19 ENCOUNTER — Ambulatory Visit
Admission: RE | Admit: 2013-12-19 | Discharge: 2013-12-19 | Disposition: A | Discharge status: Home / Self Care | Payer: Medicare Other | Source: Ambulatory Visit | Attending: Radiation Oncology | Admitting: Radiation Oncology

## 2013-12-19 ENCOUNTER — Encounter: Payer: Self-pay | Admitting: Radiation Oncology

## 2013-12-19 VITALS — BP 114/71 | HR 98 | Temp 97.7°F | Resp 20 | Wt 153.0 lb

## 2013-12-19 DIAGNOSIS — C3490 Malignant neoplasm of unspecified part of unspecified bronchus or lung: Secondary | ICD-10-CM

## 2013-12-19 DIAGNOSIS — C3410 Malignant neoplasm of upper lobe, unspecified bronchus or lung: Secondary | ICD-10-CM

## 2013-12-19 NOTE — Progress Notes (Signed)
  Radiation Oncology         (336) [phone] ________________________________  Name: Marco [name] MRN: [id number]  Date: 12/19/2013  DOB: 05/21/1951  COMPLEX SIMULATION  NOTE  Diagnosis: lung cancer  Narrative The patient has undergone a complex simulation for the patient's upcoming boost treatment.   Radiation dose prior to boost: 60 Gy  Boost dose to the high risk target:  6 Gy, to be delivered in 3 fractions  To accomplish the boost treatment, an additional 5 customized blocks have been designed for this purpose, and each of these complex treatment devices will be used on a daily basis. A complex isodose plan is requested to ensure that the high-risk target region receives the appropriate radiation dose and that the nearby normal structures continue to be appropriately spared. Dose volume histograms for the cumulative plan will be reviewed for the relevant structures.   Total dose after boost:  66 Gy    ________________________________   Jodelle Gross, MD, PhD

## 2013-12-19 NOTE — Progress Notes (Signed)
   Department of Radiation Oncology  Phone:  [phone] Fax:        [phone]  Weekly Treatment Note    Name: Marco [name] Date: 12/19/2013 MRN: [id number] DOB: Jan 27, 1951   Current dose: 60 Gy  Current fraction: 30   MEDICATIONS: Current Outpatient Prescriptions  Medication Sig Dispense Refill  . bisacodyl (DULCOLAX) 5 MG EC tablet Take 5 mg by mouth.      . donepezil (ARICEPT) 10 MG tablet Take 10 mg by mouth.      . emollient (BIAFINE) cream Apply 1 application topically 2 (two) times daily.      . fluticasone (FLONASE) 50 MCG/ACT nasal spray Place 2 sprays into both nostrils daily.      Marland Kitchen levETIRAcetam (KEPPRA) 750 MG tablet Take 750 mg by mouth 2 (two) times daily.      Marland Kitchen LORazepam (ATIVAN) 1 MG tablet Take 2 mg by mouth at bedtime.      Marland Kitchen omeprazole (PRILOSEC) 20 MG capsule Take 20 mg by mouth 2 (two) times daily.      . prochlorperazine (COMPAZINE) 10 MG tablet Take 1 tablet (10 mg total) by mouth every 6 (six) hours as needed for nausea or vomiting.  60 tablet  0  . risperiDONE (RISPERDAL) 3 MG tablet Take 3 mg by mouth at bedtime.      . traZODone (DESYREL) 50 MG tablet Take 25 mg by mouth at bedtime.       No current facility-administered medications for this encounter.     ALLERGIES: Review of patient's allergies indicates no known allergies.   LABORATORY DATA:  Lab Results  Component Value Date   WBC 5.8 12/15/2013   HGB 14.0 12/15/2013   HCT 41.4 12/15/2013   MCV 89.0 12/15/2013   PLT 154 12/15/2013   Lab Results  Component Value Date   NA 142 12/15/2013   K 4.0 12/15/2013   CL 104 09/10/2013   CO2 26 12/15/2013   Lab Results  Component Value Date   ALT 9 12/15/2013   AST 11 12/15/2013   ALKPHOS 67 12/15/2013   BILITOT 0.67 12/15/2013     NARRATIVE: Marco [name] was seen today for weekly treatment management. The chart was checked and the patient's films were reviewed. The patient is doing very well according to his family. He is not  complaining of any discomfort/pain that we are aware. Good appetite. No apparent fatigue.  PHYSICAL EXAMINATION: weight is 153 lb (69.4 kg). His oral temperature is 97.7 F (36.5 C). His blood pressure is 114/71 and his pulse is 98. His respiration is 20 and oxygen saturation is 100%.        ASSESSMENT: The patient is doing satisfactorily with treatment.  PLAN: We will continue with the patient's radiation treatment as planned.

## 2013-12-19 NOTE — Progress Notes (Signed)
Weekly rad txs,  Left lung, 30 tx s completed, mild discoloration on front chest and back chest, using biafine cream bid, no c/o, appetite good, no fatigue, or nausea, no sob 100% room air sats. No c/o pain 4:07 PM

## 2013-12-22 ENCOUNTER — Ambulatory Visit: Payer: Medicare Other

## 2013-12-22 ENCOUNTER — Ambulatory Visit (HOSPITAL_BASED_OUTPATIENT_CLINIC_OR_DEPARTMENT_OTHER): Payer: Medicare Other

## 2013-12-22 ENCOUNTER — Ambulatory Visit
Admission: RE | Admit: 2013-12-22 | Discharge: 2013-12-22 | Disposition: A | Discharge status: Home / Self Care | Payer: Medicare Other | Source: Ambulatory Visit | Attending: Radiation Oncology | Admitting: Radiation Oncology

## 2013-12-22 ENCOUNTER — Other Ambulatory Visit (HOSPITAL_BASED_OUTPATIENT_CLINIC_OR_DEPARTMENT_OTHER): Payer: Medicare Other

## 2013-12-22 VITALS — BP 123/85 | HR 101 | Temp 97.8°F | Resp 16

## 2013-12-22 DIAGNOSIS — C3492 Malignant neoplasm of unspecified part of left bronchus or lung: Secondary | ICD-10-CM

## 2013-12-22 DIAGNOSIS — C3410 Malignant neoplasm of upper lobe, unspecified bronchus or lung: Secondary | ICD-10-CM

## 2013-12-22 DIAGNOSIS — Z5111 Encounter for antineoplastic chemotherapy: Secondary | ICD-10-CM

## 2013-12-22 DIAGNOSIS — C3490 Malignant neoplasm of unspecified part of unspecified bronchus or lung: Secondary | ICD-10-CM

## 2013-12-22 LAB — COMPREHENSIVE METABOLIC PANEL (CC13)
ALT: 7 U/L (ref 0–55)
ANION GAP: 9 meq/L (ref 3–11)
AST: 9 U/L (ref 5–34)
Albumin: 3.7 g/dL (ref 3.5–5.0)
Alkaline Phosphatase: 57 U/L (ref 40–150)
BUN: 16.7 mg/dL (ref 7.0–26.0)
CALCIUM: 9.4 mg/dL (ref 8.4–10.4)
CHLORIDE: 110 meq/L — AB (ref 98–109)
CO2: 26 mEq/L (ref 22–29)
Creatinine: 0.9 mg/dL (ref 0.7–1.3)
Glucose: 102 mg/dl (ref 70–140)
Potassium: 4.1 mEq/L (ref 3.5–5.1)
SODIUM: 145 meq/L (ref 136–145)
TOTAL PROTEIN: 6.7 g/dL (ref 6.4–8.3)
Total Bilirubin: 0.27 mg/dL (ref 0.20–1.20)

## 2013-12-22 LAB — CBC WITH DIFFERENTIAL/PLATELET
BASO%: 0.5 % (ref 0.0–2.0)
Basophils Absolute: 0 10*3/uL (ref 0.0–0.1)
EOS%: 0.2 % (ref 0.0–7.0)
Eosinophils Absolute: 0 10*3/uL (ref 0.0–0.5)
HCT: 37.3 % — ABNORMAL LOW (ref 38.4–49.9)
HGB: 12.3 g/dL — ABNORMAL LOW (ref 13.0–17.1)
LYMPH#: 0.5 10*3/uL — AB (ref 0.9–3.3)
LYMPH%: 10.3 % — ABNORMAL LOW (ref 14.0–49.0)
MCH: 29.8 pg (ref 27.2–33.4)
MCHC: 33 g/dL (ref 32.0–36.0)
MCV: 90.3 fL (ref 79.3–98.0)
MONO#: 0.3 10*3/uL (ref 0.1–0.9)
MONO%: 7.1 % (ref 0.0–14.0)
NEUT#: 3.6 10*3/uL (ref 1.5–6.5)
NEUT%: 81.9 % — ABNORMAL HIGH (ref 39.0–75.0)
Platelets: 132 10*3/uL — ABNORMAL LOW (ref 140–400)
RBC: 4.13 10*6/uL — AB (ref 4.20–5.82)
RDW: 15.9 % — ABNORMAL HIGH (ref 11.0–14.6)
WBC: 4.4 10*3/uL (ref 4.0–10.3)

## 2013-12-22 MED ORDER — SODIUM CHLORIDE 0.9 % IV SOLN
45.0000 mg/m2 | Freq: Once | INTRAVENOUS | Status: AC
  Administered 2013-12-22: 84 mg via INTRAVENOUS
  Filled 2013-12-22: qty 14

## 2013-12-22 MED ORDER — CARBOPLATIN CHEMO INJECTION 450 MG/45ML
200.0000 mg | Freq: Once | INTRAVENOUS | Status: AC
  Administered 2013-12-22: 200 mg via INTRAVENOUS
  Filled 2013-12-22: qty 20

## 2013-12-22 MED ORDER — DIPHENHYDRAMINE HCL 50 MG/ML IJ SOLN
INTRAMUSCULAR | Status: AC
  Filled 2013-12-22: qty 1

## 2013-12-22 MED ORDER — DEXAMETHASONE SODIUM PHOSPHATE 20 MG/5ML IJ SOLN
INTRAMUSCULAR | Status: AC
  Filled 2013-12-22: qty 5

## 2013-12-22 MED ORDER — FAMOTIDINE IN NACL 20-0.9 MG/50ML-% IV SOLN
INTRAVENOUS | Status: AC
  Filled 2013-12-22: qty 50

## 2013-12-22 MED ORDER — DIPHENHYDRAMINE HCL 50 MG/ML IJ SOLN
50.0000 mg | Freq: Once | INTRAMUSCULAR | Status: AC
  Administered 2013-12-22: 50 mg via INTRAVENOUS

## 2013-12-22 MED ORDER — DEXAMETHASONE SODIUM PHOSPHATE 20 MG/5ML IJ SOLN
20.0000 mg | Freq: Once | INTRAMUSCULAR | Status: AC
  Administered 2013-12-22: 20 mg via INTRAVENOUS

## 2013-12-22 MED ORDER — ONDANSETRON 16 MG/50ML IVPB (CHCC)
16.0000 mg | Freq: Once | INTRAVENOUS | Status: AC
  Administered 2013-12-22: 16 mg via INTRAVENOUS

## 2013-12-22 MED ORDER — SODIUM CHLORIDE 0.9 % IV SOLN
Freq: Once | INTRAVENOUS | Status: AC
  Administered 2013-12-22: 11:00:00 via INTRAVENOUS

## 2013-12-22 MED ORDER — FAMOTIDINE IN NACL 20-0.9 MG/50ML-% IV SOLN
20.0000 mg | Freq: Once | INTRAVENOUS | Status: AC
  Administered 2013-12-22: 20 mg via INTRAVENOUS

## 2013-12-22 MED ORDER — ONDANSETRON 16 MG/50ML IVPB (CHCC)
INTRAVENOUS | Status: AC
  Filled 2013-12-22: qty 16

## 2013-12-22 NOTE — Progress Notes (Signed)
Discharged at 1355 with sister and caregiver Wilder Glade, ambulatory in no distress. Called [phone] to alert them of desire for RT at this time.

## 2013-12-22 NOTE — Patient Instructions (Signed)
[facility] Discharge Instructions for Patients Receiving Chemotherapy  Today you received the following chemotherapy agents Taxol, carboplatin  To help prevent nausea and vomiting after your treatment, we encourage you to take your nausea medication compazine 10 mg as ordered    If you develop nausea and vomiting that is not controlled by your nausea medication, call the clinic.   BELOW ARE SYMPTOMS THAT SHOULD BE REPORTED IMMEDIATELY:  *FEVER GREATER THAN 100.5 F  *CHILLS WITH OR WITHOUT FEVER  NAUSEA AND VOMITING THAT IS NOT CONTROLLED WITH YOUR NAUSEA MEDICATION  *UNUSUAL SHORTNESS OF BREATH  *UNUSUAL BRUISING OR BLEEDING  TENDERNESS IN MOUTH AND THROAT WITH OR WITHOUT PRESENCE OF ULCERS  *URINARY PROBLEMS  *BOWEL PROBLEMS  UNUSUAL RASH Items with * indicate a potential emergency and should be followed up as soon as possible.  Feel free to call the clinic you have any questions or concerns. The clinic phone number is [phone].

## 2013-12-23 ENCOUNTER — Ambulatory Visit
Admission: RE | Admit: 2013-12-23 | Discharge: 2013-12-23 | Disposition: A | Discharge status: Home / Self Care | Payer: Medicare Other | Source: Ambulatory Visit | Attending: Radiation Oncology | Admitting: Radiation Oncology

## 2013-12-23 ENCOUNTER — Ambulatory Visit: Payer: Medicare Other

## 2013-12-24 ENCOUNTER — Ambulatory Visit: Payer: Medicare Other

## 2013-12-24 ENCOUNTER — Encounter: Payer: Self-pay | Admitting: Radiation Oncology

## 2013-12-24 ENCOUNTER — Ambulatory Visit
Admission: RE | Admit: 2013-12-24 | Discharge: 2013-12-24 | Disposition: A | Discharge status: Home / Self Care | Payer: Medicare Other | Source: Ambulatory Visit | Attending: Radiation Oncology | Admitting: Radiation Oncology

## 2013-12-24 VITALS — BP 109/74 | HR 98 | Resp 18 | Wt 158.5 lb

## 2013-12-24 DIAGNOSIS — C3410 Malignant neoplasm of upper lobe, unspecified bronchus or lung: Secondary | ICD-10-CM

## 2013-12-24 NOTE — Progress Notes (Signed)
   Department of Radiation Oncology  Phone:  [phone] Fax:        [phone]  Weekly Treatment Note    Name: Marco [name] Date: 12/24/2013 MRN: [id number] DOB: 10/06/1950   Current dose: 66 Gy  Current fraction: 33   MEDICATIONS: Current Outpatient Prescriptions  Medication Sig Dispense Refill  . bisacodyl (DULCOLAX) 5 MG EC tablet Take 5 mg by mouth.      . donepezil (ARICEPT) 10 MG tablet Take 10 mg by mouth.      . emollient (BIAFINE) cream Apply 1 application topically 2 (two) times daily.      . fluticasone (FLONASE) 50 MCG/ACT nasal spray Place 2 sprays into both nostrils daily.      Marland Kitchen levETIRAcetam (KEPPRA) 750 MG tablet Take 750 mg by mouth 2 (two) times daily.      Marland Kitchen LORazepam (ATIVAN) 1 MG tablet Take 2 mg by mouth at bedtime.      Marland Kitchen omeprazole (PRILOSEC) 20 MG capsule Take 20 mg by mouth 2 (two) times daily.      . prochlorperazine (COMPAZINE) 10 MG tablet Take 1 tablet (10 mg total) by mouth every 6 (six) hours as needed for nausea or vomiting.  60 tablet  0  . risperiDONE (RISPERDAL) 3 MG tablet Take 3 mg by mouth at bedtime.      . traZODone (DESYREL) 50 MG tablet Take 25 mg by mouth at bedtime.       No current facility-administered medications for this encounter.     ALLERGIES: Review of patient's allergies indicates no known allergies.   LABORATORY DATA:  Lab Results  Component Value Date   WBC 4.4 12/22/2013   HGB 12.3* 12/22/2013   HCT 37.3* 12/22/2013   MCV 90.3 12/22/2013   PLT 132* 12/22/2013   Lab Results  Component Value Date   NA 145 12/22/2013   K 4.1 12/22/2013   CL 104 09/10/2013   CO2 26 12/22/2013   Lab Results  Component Value Date   ALT 7 12/22/2013   AST 9 12/22/2013   ALKPHOS 57 12/22/2013   BILITOT 0.27 12/22/2013     NARRATIVE: Marco [name] was seen today for weekly treatment management. The chart was checked and the patient's films were reviewed. The patient completed her final fraction today. He has done well.  Continues to be with a very good appetite. He is pleased with finishing treatment.  PHYSICAL EXAMINATION: weight is 158 lb 8 oz (71.895 kg). His blood pressure is 109/74 and his pulse is 98. His respiration is 18 and oxygen saturation is 99%.      the patient's skin shows some radiation effect. No moist desquamation.  ASSESSMENT: The patient did satisfactorily with treatment.  PLAN: Followup in one month. The patient will continue to use skin cream for 2 weeks.

## 2013-12-24 NOTE — Progress Notes (Signed)
Patient denies pain at this time. Patient denies skin changes within treatment field. Reports that the caregiver where he lives apply Biafine bid. Encouraged they continue to do so for the next two weeks. Family member verbalized understanding. Family member reports the patient walks regularly with her and doesn't fatigue easily. Patient denies cough or shortness of breath. Patient denies difficulty swallowing. One month follow up appointment set for 01/29/2014.

## 2013-12-25 ENCOUNTER — Ambulatory Visit: Payer: Medicare Other

## 2014-01-08 NOTE — Progress Notes (Signed)
  Radiation Oncology         (336) [phone] ________________________________  Name: Marco [name] MRN: [id number]  Date: 12/24/2013  DOB: 05/02/1951  End of Treatment Note  Diagnosis:   Non-small cell lung cancer     Indication for treatment:  Curative       Radiation treatment dates:   11/07/2013 through 12/24/2013  Site/dose:   The patient was treated to the thoracic disease centered on the left upper lobe mass and regional high-risk regions. The patient was treated initially to 60 gray in 30 fractions at 2 gray per fraction using a 3-D conformal technique. The patient then received a boost for an additional 6 gray. The patient was given 66 gray with concurrent chemotherapy.  Narrative: The patient tolerated radiation treatment relatively well.   The patient was able to complete his prescribed course of treatment without substantial delay. The patient did not have significant complaints in terms of acute toxicity including esophagitis. His appetite remained good.  Plan: The patient has completed radiation treatment. The patient will return to radiation oncology clinic for routine followup in one month. I advised the patient to call or return sooner if they have any questions or concerns related to their recovery or treatment. ________________________________  Jodelle Gross, M.D., Ph.D.

## 2014-01-27 ENCOUNTER — Encounter (HOSPITAL_COMMUNITY): Payer: Self-pay

## 2014-01-27 ENCOUNTER — Encounter: Payer: Self-pay | Admitting: Radiation Oncology

## 2014-01-27 ENCOUNTER — Other Ambulatory Visit (HOSPITAL_BASED_OUTPATIENT_CLINIC_OR_DEPARTMENT_OTHER): Payer: Medicare Other

## 2014-01-27 ENCOUNTER — Ambulatory Visit (HOSPITAL_COMMUNITY)
Admission: RE | Admit: 2014-01-27 | Discharge: 2014-01-27 | Disposition: A | Discharge status: Home / Self Care | Payer: Medicare Other | Source: Ambulatory Visit | Attending: Internal Medicine | Admitting: Internal Medicine

## 2014-01-27 DIAGNOSIS — C3490 Malignant neoplasm of unspecified part of unspecified bronchus or lung: Secondary | ICD-10-CM

## 2014-01-27 DIAGNOSIS — C3410 Malignant neoplasm of upper lobe, unspecified bronchus or lung: Secondary | ICD-10-CM | POA: Insufficient documentation

## 2014-01-27 DIAGNOSIS — Z79899 Other long term (current) drug therapy: Secondary | ICD-10-CM | POA: Insufficient documentation

## 2014-01-27 LAB — COMPREHENSIVE METABOLIC PANEL (CC13)
ALBUMIN: 3.7 g/dL (ref 3.5–5.0)
ALT: 11 U/L (ref 0–55)
ANION GAP: 9 meq/L (ref 3–11)
AST: 11 U/L (ref 5–34)
Alkaline Phosphatase: 67 U/L (ref 40–150)
BUN: 16.8 mg/dL (ref 7.0–26.0)
CO2: 25 meq/L (ref 22–29)
Calcium: 8.8 mg/dL (ref 8.4–10.4)
Chloride: 109 mEq/L (ref 98–109)
Creatinine: 0.9 mg/dL (ref 0.7–1.3)
GLUCOSE: 101 mg/dL (ref 70–140)
Potassium: 3.9 mEq/L (ref 3.5–5.1)
Sodium: 143 mEq/L (ref 136–145)
Total Bilirubin: 0.3 mg/dL (ref 0.20–1.20)
Total Protein: 7 g/dL (ref 6.4–8.3)

## 2014-01-27 LAB — CBC WITH DIFFERENTIAL/PLATELET
BASO%: 0.9 % (ref 0.0–2.0)
BASOS ABS: 0.1 10*3/uL (ref 0.0–0.1)
EOS ABS: 0 10*3/uL (ref 0.0–0.5)
EOS%: 0.3 % (ref 0.0–7.0)
HEMATOCRIT: 38.9 % (ref 38.4–49.9)
HEMOGLOBIN: 12.7 g/dL — AB (ref 13.0–17.1)
LYMPH%: 10.7 % — AB (ref 14.0–49.0)
MCH: 29.8 pg (ref 27.2–33.4)
MCHC: 32.6 g/dL (ref 32.0–36.0)
MCV: 91.4 fL (ref 79.3–98.0)
MONO#: 0.5 10*3/uL (ref 0.1–0.9)
MONO%: 6.6 % (ref 0.0–14.0)
NEUT%: 81.5 % — AB (ref 39.0–75.0)
NEUTROS ABS: 6.4 10*3/uL (ref 1.5–6.5)
PLATELETS: 208 10*3/uL (ref 140–400)
RBC: 4.26 10*6/uL (ref 4.20–5.82)
RDW: 17 % — ABNORMAL HIGH (ref 11.0–14.6)
WBC: 7.9 10*3/uL (ref 4.0–10.3)
lymph#: 0.8 10*3/uL — ABNORMAL LOW (ref 0.9–3.3)

## 2014-01-27 MED ORDER — IOHEXOL 300 MG/ML  SOLN
80.0000 mL | Freq: Once | INTRAMUSCULAR | Status: AC | PRN
  Administered 2014-01-27: 80 mL via INTRAVENOUS

## 2014-01-29 ENCOUNTER — Ambulatory Visit
Admission: RE | Admit: 2014-01-29 | Discharge: 2014-01-29 | Disposition: A | Discharge status: Home / Self Care | Payer: Medicare Other | Source: Ambulatory Visit | Attending: Radiation Oncology | Admitting: Radiation Oncology

## 2014-01-29 ENCOUNTER — Encounter: Payer: Self-pay | Admitting: Radiation Oncology

## 2014-01-29 VITALS — BP 123/78 | HR 69 | Temp 98.3°F | Resp 20 | Ht 68.0 in | Wt 160.4 lb

## 2014-01-29 DIAGNOSIS — C3410 Malignant neoplasm of upper lobe, unspecified bronchus or lung: Secondary | ICD-10-CM

## 2014-01-29 HISTORY — DX: Personal history of irradiation: Z92.3

## 2014-01-29 NOTE — Progress Notes (Signed)
Radiation Oncology         [phone] ________________________________  Name: Marco [name] MRN: [id number]  Date: 01/29/2014  DOB: April 13, 1951  Follow-Up Visit Note  CC: Leamon Arnt, MD  Grace Isaac, MD  Diagnosis:   Non-small cell lung cancer  Interval Since Last Radiation:  One month   Narrative:  The patient returns today for routine follow-up.  The patient and his family indicate that he is doing very well. The patient denies any difficulties with chest pain or worsening shortness of breath. No complaints regarding esophagitis and the patient and his family indicate that his appetite is very good. The patient's skin has healed well.                              ALLERGIES:  has No Known Allergies.  Meds: Current Outpatient Prescriptions  Medication Sig Dispense Refill  . donepezil (ARICEPT) 10 MG tablet Take 10 mg by mouth.      . fluticasone (FLONASE) 50 MCG/ACT nasal spray Place 2 sprays into both nostrils daily.      Marland Kitchen levETIRAcetam (KEPPRA) 750 MG tablet Take 750 mg by mouth 2 (two) times daily.      Marland Kitchen LORazepam (ATIVAN) 1 MG tablet Take 2 mg by mouth at bedtime.      Marland Kitchen omeprazole (PRILOSEC) 20 MG capsule Take 20 mg by mouth 2 (two) times daily.      . risperiDONE (RISPERDAL) 3 MG tablet Take 3 mg by mouth at bedtime.      . traZODone (DESYREL) 50 MG tablet Take 25 mg by mouth at bedtime.      Marland Kitchen emollient (BIAFINE) cream Apply 1 application topically 2 (two) times daily.       No current facility-administered medications for this encounter.    Physical Findings: The patient is in no acute distress. Patient is alert and oriented.  height is 5\' 8"  (1.727 m) and weight is 160 lb 6.4 oz (72.757 kg). His oral temperature is 98.3 F (36.8 C). His blood pressure is 123/78 and his pulse is 69. His respiration is 20 and oxygen saturation is 98%. .   General: Well-developed, in no acute distress HEENT: Normocephalic, atraumatic Cardiovascular: Regular rate and  rhythm Respiratory: Clear to auscultation bilaterally GI: Soft, nontender, normal bowel sounds Extremities: No edema present   Lab Findings: Lab Results  Component Value Date   WBC 7.9 01/27/2014   HGB 12.7* 01/27/2014   HCT 38.9 01/27/2014   MCV 91.4 01/27/2014   PLT 208 01/27/2014     Radiographic Findings: Ct Chest W Contrast  01/27/2014   CLINICAL DATA:  Followup left lung squamous cell carcinoma. Undergoing chemotherapy and radiation therapy. Restaging.  EXAM: CT CHEST WITH CONTRAST  TECHNIQUE: Multidetector CT imaging of the chest was performed during intravenous contrast administration.  CONTRAST:  51mL OMNIPAQUE IOHEXOL 300 MG/ML  SOLN  COMPARISON:  09/22/13 and 09/09/2013  FINDINGS: Previously seen mass in the central left upper lobe shows significant decrease in size as well as small central areas cystic lucency or cavitation. This currently measures 2.3 x 1.9 cm on image 25 compared to 3.0 x 3.1 cm previously. There has also been decrease in contiguous left hilar lymphadenopathy. No mediastinal lymphadenopathy identified. No evidence of pleural or pericardial effusion.  New patchy areas of airspace opacity are seen in both upper lobes, which may be due to drug toxicity, radiation pneumonitis, and/or infection. No evidence of  central endobronchial obstruction. No evidence of chest wall mass or suspicious bone lesions. Both adrenal glands are normal in appearance.  IMPRESSION: Interval decrease in size of central left upper lobe mass and contiguous left hilar lymphadenopathy.  New patchy airspace opacity in both upper lobes. Differential diagnosis includes drug toxicity, radiation pneumonitis, and/or infection.   Electronically Signed   By: Earle Gell M.D.   On: 01/27/2014 17:14    Impression:    The patient clinically is doing well at this time. He has recovered well from his treatment.  Plan:  Followup in 6 months.   Jodelle Gross, M.D., Ph.D.

## 2014-01-29 NOTE — Progress Notes (Signed)
Follow up s/p rad txs 11/07/13-12/24/13, no c/o sob 98% room air, no coughing, great appetite, no fatigue or nausea, I'm getting fat" stated patient "i eat all day",good spirits 3:51 PM

## 2014-02-04 ENCOUNTER — Ambulatory Visit (HOSPITAL_BASED_OUTPATIENT_CLINIC_OR_DEPARTMENT_OTHER): Payer: Medicare Other | Admitting: Internal Medicine

## 2014-02-04 ENCOUNTER — Encounter: Payer: Self-pay | Admitting: Internal Medicine

## 2014-02-04 ENCOUNTER — Telehealth: Payer: Self-pay | Admitting: Internal Medicine

## 2014-02-04 VITALS — BP 122/87 | HR 84 | Temp 97.7°F | Resp 19 | Ht 68.0 in | Wt 160.3 lb

## 2014-02-04 DIAGNOSIS — C3410 Malignant neoplasm of upper lobe, unspecified bronchus or lung: Secondary | ICD-10-CM

## 2014-02-04 NOTE — Progress Notes (Signed)
[facility] [phone]   Fax:([phone]) Cook L, MD [place] [address]  DIAGNOSIS: Stage IIA (T2a., N1, M0) non-small cell lung cancer, squamous cell carcinoma presented with central left upper lobe mass as well as hilar lymphadenopathy diagnosed in January of 2015.  PRIOR THERAPY: Concurrent chemoradiation with weekly carboplatin for AUC of 2 and paclitaxel 45 mg/M2, status post 7 cycles. Last cycle was given 12/22/2013  CURRENT THERAPY: Observation.  INTERVAL HISTORY: Marco [name] 63 y.o. male returns to the clinic today for followup visit accompanied by his caregiver, Valorie Roosevelt. The patient tolerated the previous course of concurrent chemoradiation fairly well with no significant adverse effects. He denied having any nausea or vomiting, no fever or chills. He denied having any chest pain, shortness of breath, cough or hemoptysis. He denied having any significant weight loss or night sweats. He has been observations for the last 6 weeks. He has repeat CT scan of the chest performed recently and he is here for evaluation and discussion of his scan results.  MEDICAL HISTORY: Past Medical History  Diagnosis Date  . Mental disorder     sczizophrenia;moderate retardation  . Depression   . DEMENTIA   . Hypertension     no medications, no documented history per caregiver at preadmission  . GERD (gastroesophageal reflux disease)   . Seizures   . Developmental disability     developmentaly delayed  . Hx of radiation therapy 11/07/13-12/24/13    lung,60Gy/41fx    ALLERGIES:  has No Known Allergies.  MEDICATIONS:  Current Outpatient Prescriptions  Medication Sig Dispense Refill  . donepezil (ARICEPT) 10 MG tablet Take 10 mg by mouth.      . emollient (BIAFINE) cream Apply 1 application topically 2 (two) times daily.      . fluticasone (FLONASE) 50 MCG/ACT nasal spray Place 2 sprays into both  nostrils daily.      Marland Kitchen levETIRAcetam (KEPPRA) 750 MG tablet Take 750 mg by mouth 2 (two) times daily.      Marland Kitchen LORazepam (ATIVAN) 1 MG tablet Take 2 mg by mouth at bedtime.      Marland Kitchen omeprazole (PRILOSEC) 20 MG capsule Take 20 mg by mouth 2 (two) times daily.      . risperiDONE (RISPERDAL) 3 MG tablet Take 3 mg by mouth at bedtime.      . traZODone (DESYREL) 50 MG tablet Take 25 mg by mouth at bedtime.       No current facility-administered medications for this visit.    SURGICAL HISTORY:  Past Surgical History  Procedure Laterality Date  . Cataract extraction w/phaco  07/12/2011    Procedure: CATARACT EXTRACTION PHACO AND INTRAOCULAR LENS PLACEMENT (IOC);  Surgeon: Adonis Brook;  Location: [facility];  Service: Ophthalmology;  Laterality: Left;  Marland Kitchen Eye surgery      L eye  . Video bronchoscopy Bilateral 10/01/2013    Procedure: VIDEO BRONCHOSCOPY WITHOUT FLUORO;  Surgeon: Collene Gobble, MD;  Location: [facility];  Service: Cardiopulmonary;  Laterality: Bilateral;    REVIEW OF SYSTEMS:  Constitutional: negative Eyes: negative Ears, nose, mouth, throat, and face: negative Respiratory: negative Cardiovascular: negative Gastrointestinal: negative Genitourinary:negative Integument/breast: negative Hematologic/lymphatic: negative Musculoskeletal:negative Neurological: negative Behavioral/Psych: negative Endocrine: negative Allergic/Immunologic: negative   PHYSICAL EXAMINATION: General appearance: alert, cooperative and no distress Head: Normocephalic, without obvious abnormality, atraumatic Neck: no adenopathy, no JVD, supple, symmetrical, trachea midline and thyroid not enlarged, symmetric, no tenderness/mass/nodules  Lymph nodes: Cervical, supraclavicular, and axillary nodes normal. Resp: clear to auscultation bilaterally Back: symmetric, no curvature. ROM normal. No CVA tenderness. Cardio: regular rate and rhythm, S1, S2 normal, no murmur, click, rub or gallop GI: soft, non-tender; bowel  sounds normal; no masses,  no organomegaly Extremities: extremities normal, atraumatic, no cyanosis or edema Neurologic: Alert and oriented X 3, normal strength and tone. Normal symmetric reflexes. Normal coordination and gait  ECOG PERFORMANCE STATUS: 1 - Symptomatic but completely ambulatory  Blood pressure 122/87, pulse 84, temperature 97.7 F (36.5 C), temperature source Oral, resp. rate 19, height 5\' 8"  (1.727 m), weight 160 lb 4.8 oz (72.712 kg), SpO2 100.00%.  LABORATORY DATA: Lab Results  Component Value Date   WBC 7.9 01/27/2014   HGB 12.7* 01/27/2014   HCT 38.9 01/27/2014   MCV 91.4 01/27/2014   PLT 208 01/27/2014      Chemistry      Component Value Date/Time   NA 143 01/27/2014 1606   NA 143 09/10/2013 0551   K 3.9 01/27/2014 1606   K 3.6* 09/10/2013 0551   CL 104 09/10/2013 0551   CO2 25 01/27/2014 1606   CO2 21 09/10/2013 0551   BUN 16.8 01/27/2014 1606   BUN 17 09/10/2013 0551   CREATININE 0.9 01/27/2014 1606   CREATININE 0.90 09/10/2013 0551      Component Value Date/Time   CALCIUM 8.8 01/27/2014 1606   CALCIUM 9.3 09/10/2013 0551   ALKPHOS 67 01/27/2014 1606   ALKPHOS 64 09/09/2013 0415   AST 11 01/27/2014 1606   AST 56* 09/09/2013 0415   ALT 11 01/27/2014 1606   ALT 27 09/09/2013 0415   BILITOT 0.30 01/27/2014 1606   BILITOT 0.7 09/09/2013 0415       RADIOGRAPHIC STUDIES: Ct Chest W Contrast  01/27/2014   CLINICAL DATA:  Followup left lung squamous cell carcinoma. Undergoing chemotherapy and radiation therapy. Restaging.  EXAM: CT CHEST WITH CONTRAST  TECHNIQUE: Multidetector CT imaging of the chest was performed during intravenous contrast administration.  CONTRAST:  78mL OMNIPAQUE IOHEXOL 300 MG/ML  SOLN  COMPARISON:  09/22/13 and 09/09/2013  FINDINGS: Previously seen mass in the central left upper lobe shows significant decrease in size as well as small central areas cystic lucency or cavitation. This currently measures 2.3 x 1.9 cm on image 25 compared to 3.0 x 3.1 cm previously.  There has also been decrease in contiguous left hilar lymphadenopathy. No mediastinal lymphadenopathy identified. No evidence of pleural or pericardial effusion.  New patchy areas of airspace opacity are seen in both upper lobes, which may be due to drug toxicity, radiation pneumonitis, and/or infection. No evidence of central endobronchial obstruction. No evidence of chest wall mass or suspicious bone lesions. Both adrenal glands are normal in appearance.  IMPRESSION: Interval decrease in size of central left upper lobe mass and contiguous left hilar lymphadenopathy.  New patchy airspace opacity in both upper lobes. Differential diagnosis includes drug toxicity, radiation pneumonitis, and/or infection.   Electronically Signed   By: Earle Gell M.D.   On: 01/27/2014 17:14   ASSESSMENT AND PLAN: This is a very pleasant 63 years old [place] American male with unresectable a stage IIA non-small cell lung cancer. He completed a course of concurrent chemoradiation with weekly carboplatin and paclitaxel is status post 7 cycles and tolerating it fairly well.  His recent CT scan of the chest showed significant improvement in his disease was decrease in the size of the central left upper lobe lung mass  and contiguous left hilar lymphadenopathy. I discussed the scan results and showed the images to the patient and his caregiver. I recommended for him to continue on observation but I would also discussed with Dr. Servando Snare the possibility of surgical resection. I will see him back for followup visit in 3 months with repeat CT scan of the chest. He was advised to call immediately if he has any concerning symptoms in the interval. The patient voices understanding of current disease status and treatment options and is in agreement with the current care plan.  All questions were answered. The patient knows to call the clinic with any problems, questions or concerns. We can certainly see the patient much sooner if  necessary.  Disclaimer: This note was dictated with voice recognition software. Similar sounding words can inadvertently be transcribed and may not be corrected upon review.

## 2014-02-04 NOTE — Telephone Encounter (Signed)
[facility] PRINTED APPT SCHED AND AVS FOR PT FOR SEPT

## 2014-02-10 ENCOUNTER — Ambulatory Visit (INDEPENDENT_AMBULATORY_CARE_PROVIDER_SITE_OTHER): Payer: Medicare Other | Admitting: Cardiothoracic Surgery

## 2014-02-10 ENCOUNTER — Encounter: Payer: Self-pay | Admitting: Cardiothoracic Surgery

## 2014-02-10 VITALS — BP 135/79 | HR 100 | Resp 20 | Ht 68.0 in | Wt 160.0 lb

## 2014-02-10 DIAGNOSIS — C3410 Malignant neoplasm of upper lobe, unspecified bronchus or lung: Secondary | ICD-10-CM

## 2014-02-10 NOTE — Progress Notes (Signed)
[address]       Marco [name],[place] [address]             [phone]                    Marco [name] [facility] Medical Record #[id number] Date of Birth: [birth year]  Referring: Marco Arnt, MD Primary Care: Marco Arnt, MD  Chief Complaint:   Left Lung mass   History of Present Illness:    Marco [name] 63 y.o. male is seen in the office yesterday for left lung mass. Patient had  new onset of seizures in the spring of 2014.  He was started on Keppra in the spring had done well until January 5 when he presented to the emergency room with recurrent seizure. Chest x-ray done at that time a was suggestive of a left hilar mass subsequent CT and PET scan were performed. Patient was seen by the pulmonary service, bronchoscopy was performed by Dr. Lamonte Sakai :  The LLL airways were normal. There was a dark friable lesion occluding a segment of the LUL. 1:10000 epi was injected onto the lesion. Endobronchial brushings and endobronchial forceps biopsies were performed. 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   Concurrent chemoradiation with weekly carboplatin for AUC of 2 and paclitaxel 45 mg/M2, status post 7 cycles. Last cycle was given 12/22/2013    Current Activity/ Functional Status:  Patient is not independent with mobility/ambulation, transfers, ADL's, IADL's.   Zubrod Score: At the time of surgery this patient's most appropriate activity status/level should be described as: []     0    Normal activity, no symptoms [x]     1    Restricted in physical strenuous activity but ambulatory, able to do out light work []     2    Ambulatory and capable of self care, unable to do work activities, up and about               >50 % of waking hours                              []     3    Only limited self care, in bed greater than 50% of waking hours []     4    Completely  disabled, no self care, confined to bed or chair []     5    Moribund   Past Medical History  Diagnosis Date  . Mental disorder     sczizophrenia;moderate retardation  . Depression   . DEMENTIA   . Hypertension     no medications, no documented history per caregiver at preadmission  . GERD (gastroesophageal reflux disease)   . Seizures   . Developmental disability     developmentaly delayed  . Hx of radiation therapy 11/07/13-12/24/13    lung,60Gy/51fx    Past Surgical History  Procedure Laterality Date  . Cataract extraction w/phaco  07/12/2011    Procedure: CATARACT EXTRACTION PHACO AND INTRAOCULAR LENS PLACEMENT (IOC);  Surgeon: Marco [name];  Location: [facility];  Service: Ophthalmology;  Laterality: Left;  Marland Kitchen Eye surgery      L eye  . Video bronchoscopy Bilateral 10/01/2013    Procedure: VIDEO BRONCHOSCOPY WITHOUT FLUORO;  Surgeon: Collene Gobble, MD;  Location: [facility];  Service: Cardiopulmonary;  Laterality: Bilateral;    No family history on file.  History   Social History  . Marital Status: Single    Spouse Name: N/A    Number of Children: N/A  . Years of Education: N/A   Occupational History  . Not on file.   Social History Main Topics  . Smoking status: Former Smoker -- 0.50 packs/day for 35 years    Types: Cigarettes    Quit date: 09/07/2013  . Smokeless tobacco: Not on file  . Alcohol Use: No  . Drug Use: No  . Sexual Activity: No   Other Topics Concern  . Not on file   Social History Narrative   ** Merged History Encounter **       Patient lives in a group with 3 other brothers.    Patient does not work.   Patient is single.    Patient has some HS education.    Patient has no children.    Patient has a history of psychotic disorder NOS and mental retardation.     History  Smoking status  . Former Smoker -- 0.50 packs/day for 35 years  . Types: Cigarettes  . Quit date: 09/07/2013  Smokeless tobacco  . Not on file    History  Alcohol  Use No     No Known Allergies  Current Outpatient Prescriptions  Medication Sig Dispense Refill  . bisacodyl (BISACODYL) 5 MG EC tablet Take 5 mg by mouth daily as needed for moderate constipation.      Marland Kitchen donepezil (ARICEPT) 10 MG tablet Take 10 mg by mouth.      . fluticasone (FLONASE) 50 MCG/ACT nasal spray Place 2 sprays into both nostrils daily.      Marland Kitchen levETIRAcetam (KEPPRA) 750 MG tablet Take 750 mg by mouth 2 (two) times daily.      Marland Kitchen LORazepam (ATIVAN) 1 MG tablet Take 2 mg by mouth at bedtime.      Marland Kitchen omeprazole (PRILOSEC) 20 MG capsule Take 20 mg by mouth 2 (two) times daily.      . risperiDONE (RISPERDAL) 3 MG tablet Take 3 mg by mouth at bedtime.      . sulfacetamide (BLEPH-10) 10 % ophthalmic solution Place 2 drops into the right eye every 2 (two) hours while awake.       . traZODone (DESYREL) 50 MG tablet Take 25 mg by mouth at bedtime.       No current facility-administered medications for this visit.     Review of Systems:     Cardiac Review of Systems: Y or N  Chest Pain [ n   ]  Resting SOB [  n ] Exertional SOB  [ y ]  Vertell Limber Florencio.Farrier  ]   Pedal Edema [ n  ]    Palpitations [n  ] Syncope  [ n ]   Presyncope [n   ]  General Review of Systems: [Y] = yes [  ]=no Constitional: recent weight change [ n ];  Wt loss over the last 3 months [   ] anorexia [  ]; fatigue [  ]; nausea [  ]; night sweats [  ]; fever [ n ]; or chills [n  ];          Dental: poor dentition[  ]; Last Dentist visit:   Eye : blurred vision [  ]; diplopia [   ]; vision changes [  ];  Amaurosis fugax[  ]; Resp: cough [  y];  wheezing[  y ];  hemoptysis[ n ]; shortness of breath[  ]; paroxysmal nocturnal dyspnea[ y ]; dyspnea on exertion[  ]; or orthopnea[  ];  GI:  gallstones[  ], vomiting[  ];  dysphagia[  ]; melena[  ];  hematochezia [  ]; heartburn[  ];   Hx of  Colonoscopy[  ]; GU: kidney stones [  ]; hematuria[  ];   dysuria [  ];  nocturia[  ];  history of     obstruction [  ]; urinary frequency [  ]              Skin: rash, swelling[  ];, hair loss[  ];  peripheral edema[  ];  or itching[  ]; Musculosketetal: myalgias[  ];  joint swelling[  ];  joint erythema[  ];  joint pain[  ];  back pain[  ];  Heme/Lymph: bruising[  ];  bleeding[  ];  anemia[  ];  Neuro: TIA[  ];  headaches[  ];  stroke[  ];  vertigo[  ];  seizures[  ];   paresthesias[  ];  difficulty walking[  ];  Psych:depression[  ]; anxiety[  ];  Endocrine: diabetes[  ];  thyroid dysfunction[  ];  Immunizations: Flu up to date [ y ]; Pneumococcal up to date [ y ];  Other:  Physical Exam: BP 135/79  Pulse 100  Resp 20  Ht 5\' 8"  (1.727 m)  Wt 160 lb (72.576 kg)  BMI 24.33 kg/m2  SpO2 98%  PHYSICAL EXAMINATION:  General appearance: alert, cooperative, appears older than stated age and slowed mentation Neurologic: intact Heart: regular rate and rhythm, S1, S2 normal, no murmur, click, rub or gallop Lungs: clear to auscultation bilaterally Abdomen: soft, non-tender; bowel sounds normal; no masses,  no organomegaly Extremities: extremities normal, atraumatic, no cyanosis or edema and Homans sign is negative, no sign of DVT Patient is able to cooperate with exam, he has no carotid bruits, has no cervical supraclavicular or axillary adenopathy   Diagnostic Studies & Laboratory data:     Recent Radiology Findings:   Ct Chest W Contrast  01/27/2014   CLINICAL DATA:  Followup left lung squamous cell carcinoma. Undergoing chemotherapy and radiation therapy. Restaging.  EXAM: CT CHEST WITH CONTRAST  TECHNIQUE: Multidetector CT imaging of the chest was performed during intravenous contrast administration.  CONTRAST:  32mL OMNIPAQUE IOHEXOL 300 MG/ML  SOLN  COMPARISON:  09/22/13 and 09/09/2013  FINDINGS: Previously seen mass in the central left upper lobe shows significant decrease in size as well as small central areas cystic lucency or cavitation. This currently measures 2.3 x 1.9 cm on image 25 compared to 3.0 x 3.1 cm previously.  There has also been decrease in contiguous left hilar lymphadenopathy. No mediastinal lymphadenopathy identified. No evidence of pleural or pericardial effusion.  New patchy areas of airspace opacity are seen in both upper lobes, which may be due to drug toxicity, radiation pneumonitis, and/or infection. No evidence of central endobronchial obstruction. No evidence of chest wall mass or suspicious bone lesions. Both adrenal glands are normal in appearance.  IMPRESSION: Interval decrease in size of central left upper lobe mass and contiguous left hilar lymphadenopathy.  New patchy airspace opacity in both upper lobes. Differential diagnosis includes drug toxicity, radiation pneumonitis, and/or infection.   Electronically Signed   By: Earle Gell M.D.   On: 01/27/2014 17:14    Nm Pet Image Initial (pi) Skull Base To Thigh  09/22/2013   CLINICAL DATA:  Initial treatment  strategy for lung mass.  EXAM: NUCLEAR MEDICINE PET SKULL BASE TO THIGH  FASTING BLOOD GLUCOSE:  Value: 96mg /dl  TECHNIQUE: 15.9 mCi F-18 FDG was injected intravenously. CT data was obtained and used for attenuation correction and anatomic localization only. (This was not acquired as a diagnostic CT examination.) Additional exam technical data entered on technologist worksheet.  COMPARISON:  Chest x-ray 09/08/2013.  FINDINGS: NECK  No hypermetabolic lymph nodes in the neck.  CHEST  Suspected left-sided lung mass on recent chest radiograph corresponds to a macrolobulated 4.3 x 3.0 cm hypermetabolic (SUVmax = 32.6)ZTIW upper lobe mass (image 82 of series 2). This is intimately associated with the left hilum such that left hilar lymphadenopathy and hypermetabolism cannot be excluded. No definite pathologically enlarged or hypermetabolic mediastinal or right hilar lymph nodes are noted. There is a cluster of peribronchovascular micronodularity in the periphery of the right upper lobe best demonstrated on image 85 of series 2, which corresponds with some  low-level metabolic activity in the periphery of the right upper lobe on the PET portion of the examination (SUVmax = 2.8), presumably infectious or inflammatory. No other definite suspicious appearing pulmonary nodules or masses are noted. No acute consolidative airspace disease. No pleural effusions.  ABDOMEN/PELVIS  No abnormal hypermetabolic activity within the liver, pancreas, adrenal glands, or spleen. No hypermetabolic lymph nodes in the abdomen or pelvis. 1.7 cm low-attenuation lesion lateral aspect of the interpolar region of the right kidney is incompletely characterized on today's CT examination, favored to represent a small cyst. No significant volume of ascites. No pneumoperitoneum. No pathologic distention of small bowel. Normal appendix. Prostate gland and urinary bladder are unremarkable in appearance.  SKELETON  Adjacent to the inferior aspect of the right scapula there is a focus of increased metabolic activity (SUVmax = 4.1), without a correlate on the CT portion of the examination. This may be physiologic or related to recent muscular strain. No focal hypermetabolic activity to suggest skeletal metastasis.  IMPRESSION: 1. 4.3 x 3.0 cm macrolobulated hypermetabolic left upper lobe mass in close contact with the left hilum, highly concerning for primary bronchogenic neoplasm. No definite evidence to suggest metastatic disease to the neck, abdomen or pelvis. These findings are favored to represent either T2a, N0, Mx disease, or T2a, N1, Mx disease (i.e., either stage IA or IIA disease). Surgical consultation and correlation with biopsy is recommended. 2. Small cluster of peribronchovascular micronodules in the periphery of the right upper lobe with low level hypermetabolism in this region, presumably of infectious or inflammatory etiology. Attention on followup studies is recommended to ensure resolution. 3. Small focus of hypermetabolic activity adjacent to the inferior aspect of the right scapula,  without definite correlate on the CT portion of the examination. This is nonspecific, but favored to be benign, either physiologic or related to muscular strain. 4. Additional incidental findings, as above.   Electronically Signed   By: Vinnie Langton M.D.   On: 09/22/2013 15:23      Recent Lab Findings: Lab Results  Component Value Date   WBC 7.9 01/27/2014   HGB 12.7* 01/27/2014   HCT 38.9 01/27/2014   PLT 208 01/27/2014   GLUCOSE 101 01/27/2014   ALT 11 01/27/2014   AST 11 01/27/2014   NA 143 01/27/2014   K 3.9 01/27/2014   CL 104 09/10/2013   CREATININE 0.9 01/27/2014   BUN 16.8 01/27/2014   CO2 25 01/27/2014   Diagnosis Patient: Marco [name], Marco [name] Collected: 10/01/2013 Client: [facility] Accession:  [id number] Endobronchial biopsy, LUL - INVASIVE SQUAMOUS CELL CARCINOMA   Assessment / Plan:   1. 4.3 x 3.0 cm macrolobulated hypermetabolic left upper lobe mass in close contact with the left hilumT2a, N0, Mx disease, or T2a, N1, Mx disease.  With hilar  location resection may require pneumonectomy. I have discussed the implications of surgery with patient and family/caretaker. His degree of ability to understand and cooperate with postop care will be limited. He has  had no PFT's , and today was unable to perform even basic spirometry. With the patient's inability to perform basic spirometry and anatomic setting that has a high likelihood of resulting in a pneumonectomy I was reluctant to recommend surgical intervention, because of the patient's ability to thoroughly cooperate in the postoperative period . After seeing his inability to even do the pulmonary functions his sister and caregiver after a discussion yesterday are also reluctant to agree to an operative procedure. The patient is very pleasant and cooperative and potentially could be still long enough for radiation therapy, but he has definite trouble understanding any complex instructions.  The patient has had a significant  response to radiation and chemotherapy, overall remains in good spirits through his treatments. He comes to the office today with one of his caregivers. He does not understand why he is in the office but does remember having been here before. I've discussed with his caregiver the pros and cons of proceeding with surgery at this point which probably offers some survival advantage but would be incredibly disruptive to his current lifestyle and living condition,  the nephew caregiver who is with him today will discuss with his guardian and sisters before you make a final decision. They are to call back if they would like to consider further surgical treatment .  Grace Isaac MD      [address] [place] [address] Office [phone]   Beeper [phone]  02/10/2014 3:37 PM

## 2014-02-11 ENCOUNTER — Ambulatory Visit: Payer: Medicare Other | Admitting: Cardiothoracic Surgery

## 2014-04-13 ENCOUNTER — Encounter: Payer: Self-pay | Admitting: Neurology

## 2014-04-13 ENCOUNTER — Ambulatory Visit (INDEPENDENT_AMBULATORY_CARE_PROVIDER_SITE_OTHER): Payer: Medicare Other | Admitting: Neurology

## 2014-04-13 VITALS — BP 107/77 | HR 84 | Ht 69.0 in | Wt 160.0 lb

## 2014-04-13 DIAGNOSIS — R569 Unspecified convulsions: Secondary | ICD-10-CM

## 2014-04-13 NOTE — Progress Notes (Signed)
[facility]  Provider:  Dr Janann Colonel Referring Provider: Leamon Arnt, MD Primary Care Physician:  Leamon Arnt, MD  CC:  seizures  HPI:  Marco [name] is a 63 y.o. male here as a referral for follow up seizure disorder. At last visit his Keppra was increased to 750mg  twice a day after a recent hospitalization. He was also found to have a lung mass, diagnosed as non-small cell lung CA, has been undergoing chemotherapy and radiation therapy. He was recently evaluated by cardiothoracic surgery and is considering further treatment of the mas.   Review of Systems: Out of a complete 14 system review, the patient complains of only the following symptoms, and all other reviewed systems are negative. Positive for seizure, mental retardation  History   Social History  . Marital Status: Single    Spouse Name: N/A    Number of Children: N/A  . Years of Education: N/A   Occupational History  . Not on file.   Social History Main Topics  . Smoking status: Former Smoker -- 0.50 packs/day for 35 years    Types: Cigarettes    Quit date: 09/07/2013  . Smokeless tobacco: Never Used  . Alcohol Use: No  . Drug Use: No  . Sexual Activity: No   Other Topics Concern  . Not on file   Social History Narrative   ** Merged History Encounter **       Patient lives in a group with 3 other brothers.    Patient does not work.   Patient is single.    Patient has some HS education.    Patient has no children.    Patient has a history of psychotic disorder NOS and mental retardation.     History reviewed. No pertinent family history.  Past Medical History  Diagnosis Date  . Mental disorder     sczizophrenia;moderate retardation  . Depression   . DEMENTIA   . Hypertension     no medications, no documented history per caregiver at preadmission  . GERD (gastroesophageal reflux disease)   . Seizures   . Developmental disability     developmentaly delayed  . Hx of  radiation therapy 11/07/13-12/24/13    lung,60Gy/4fx    Past Surgical History  Procedure Laterality Date  . Cataract extraction w/phaco  07/12/2011    Procedure: CATARACT EXTRACTION PHACO AND INTRAOCULAR LENS PLACEMENT (IOC);  Surgeon: Adonis Brook;  Location: [facility];  Service: Ophthalmology;  Laterality: Left;  Marland Kitchen Eye surgery      L eye  . Video bronchoscopy Bilateral 10/01/2013    Procedure: VIDEO BRONCHOSCOPY WITHOUT FLUORO;  Surgeon: Collene Gobble, MD;  Location: [facility];  Service: Cardiopulmonary;  Laterality: Bilateral;    Current Outpatient Prescriptions  Medication Sig Dispense Refill  . bisacodyl (BISACODYL) 5 MG EC tablet Take 5 mg by mouth daily as needed for moderate constipation.      Marland Kitchen donepezil (ARICEPT) 10 MG tablet Take 10 mg by mouth.      . fluticasone (FLONASE) 50 MCG/ACT nasal spray Place 2 sprays into both nostrils daily.      Marland Kitchen levETIRAcetam (KEPPRA) 750 MG tablet Take 750 mg by mouth 2 (two) times daily.      Marland Kitchen LORazepam (ATIVAN) 1 MG tablet Take 2 mg by mouth at bedtime.      Marland Kitchen omeprazole (PRILOSEC) 20 MG capsule Take 20 mg by mouth 2 (two) times daily.      . risperiDONE (RISPERDAL) 3 MG tablet Take  3 mg by mouth at bedtime.      . traZODone (DESYREL) 50 MG tablet Take 25 mg by mouth at bedtime.       No current facility-administered medications for this visit.    Allergies as of 04/13/2014  . (No Known Allergies)    Vitals: BP 107/77  Pulse 84  Ht 5\' 9"  (1.753 m)  Wt 160 lb (72.576 kg)  BMI 23.62 kg/m2 Last Weight:  Wt Readings from Last 1 Encounters:  04/13/14 160 lb (72.576 kg)   Last Height:   Ht Readings from Last 1 Encounters:  04/13/14 5\' 9"  (1.753 m)     Physical exam: Exam: Gen: NAD, conversant Eyes: anicteric sclerae, moist conjunctivae HENT: [place] Neck: Trachea midline; supple,  Lungs: CTA, no wheezing, rales, rhonic                          CV: RRR, no MRG Abdomen: Soft, non-tender;  Extremities: No peripheral edema   Skin: Normal temperature, no rash,  Psych: Appropriate affect, pleasant  Neuro: MS: Interactive, follows simple commands dysarthric difficult to understand speech   CN: PERRL, EOMI no nystagmus, no ptosis, sensation intact to LT V1-V3 bilat, face symmetric, no weakness, hearing grossly intact, shoulder shrug 5/5 bilat,  tongue protrudes midline, no fasiculations noted.  Motor: normal bulk and tone Strength: Moves all extremities spontaneously and symmetrically  Coord: Unable to test   Reflexes: symmetrical, bilat downgoing toes  Sens: LT intact in all extremities   Assessment:  After physical and neurologic examination, review of laboratory studies, imaging, neurophysiology testing and pre-existing records, assessment will be reviewed on the problem list.  Plan:  Treatment plan and additional workup will be reviewed under Problem List.  Marco [name] is a pleasant 63 year old gentleman with a history of mental retardation, schizophrenia and dementia presenting for follow up evaluation for seizure disorder. Had breakthrough event resulting in status epilepticus and hospital admission. Keppra increased to 750mg  BID, tolerating well with no further breakthrough seizures. Found to have lung mass, has completed chemo and RT. Will continue keppra at current dose for now, increase to 1gm BID if further breakthrough seizures. Follow up in 6 months.   1)seizures 2)MR 3)Schizophrenia

## 2014-05-06 ENCOUNTER — Ambulatory Visit (HOSPITAL_COMMUNITY)
Admission: RE | Admit: 2014-05-06 | Discharge: 2014-05-06 | Disposition: A | Discharge status: Home / Self Care | Payer: Medicare Other | Source: Ambulatory Visit | Attending: Internal Medicine | Admitting: Internal Medicine

## 2014-05-06 ENCOUNTER — Other Ambulatory Visit (HOSPITAL_BASED_OUTPATIENT_CLINIC_OR_DEPARTMENT_OTHER): Payer: Medicare Other

## 2014-05-06 ENCOUNTER — Encounter (HOSPITAL_COMMUNITY): Payer: Self-pay

## 2014-05-06 DIAGNOSIS — C3410 Malignant neoplasm of upper lobe, unspecified bronchus or lung: Secondary | ICD-10-CM | POA: Diagnosis not present

## 2014-05-06 DIAGNOSIS — Z923 Personal history of irradiation: Secondary | ICD-10-CM | POA: Insufficient documentation

## 2014-05-06 LAB — CBC WITH DIFFERENTIAL/PLATELET
BASO%: 0.8 % (ref 0.0–2.0)
BASOS ABS: 0.1 10*3/uL (ref 0.0–0.1)
EOS ABS: 0 10*3/uL (ref 0.0–0.5)
EOS%: 0.5 % (ref 0.0–7.0)
HCT: 40.5 % (ref 38.4–49.9)
HEMOGLOBIN: 13.2 g/dL (ref 13.0–17.1)
LYMPH%: 15.2 % (ref 14.0–49.0)
MCH: 28.6 pg (ref 27.2–33.4)
MCHC: 32.7 g/dL (ref 32.0–36.0)
MCV: 87.4 fL (ref 79.3–98.0)
MONO#: 0.6 10*3/uL (ref 0.1–0.9)
MONO%: 8.7 % (ref 0.0–14.0)
NEUT#: 5.5 10*3/uL (ref 1.5–6.5)
NEUT%: 74.8 % (ref 39.0–75.0)
Platelets: 184 10*3/uL (ref 140–400)
RBC: 4.63 10*6/uL (ref 4.20–5.82)
RDW: 14.4 % (ref 11.0–14.6)
WBC: 7.4 10*3/uL (ref 4.0–10.3)
lymph#: 1.1 10*3/uL (ref 0.9–3.3)

## 2014-05-06 LAB — COMPREHENSIVE METABOLIC PANEL (CC13)
ALBUMIN: 3.7 g/dL (ref 3.5–5.0)
ALT: 10 U/L (ref 0–55)
ANION GAP: 7 meq/L (ref 3–11)
AST: 18 U/L (ref 5–34)
Alkaline Phosphatase: 72 U/L (ref 40–150)
BUN: 16.5 mg/dL (ref 7.0–26.0)
CALCIUM: 8.9 mg/dL (ref 8.4–10.4)
CO2: 29 meq/L (ref 22–29)
Chloride: 107 mEq/L (ref 98–109)
Creatinine: 1.1 mg/dL (ref 0.7–1.3)
GLUCOSE: 96 mg/dL (ref 70–140)
POTASSIUM: 3.9 meq/L (ref 3.5–5.1)
Sodium: 143 mEq/L (ref 136–145)
Total Bilirubin: 0.37 mg/dL (ref 0.20–1.20)
Total Protein: 7.1 g/dL (ref 6.4–8.3)

## 2014-05-06 MED ORDER — IOHEXOL 300 MG/ML  SOLN
80.0000 mL | Freq: Once | INTRAMUSCULAR | Status: AC | PRN
  Administered 2014-05-06: 80 mL via INTRAVENOUS

## 2014-05-13 ENCOUNTER — Encounter: Payer: Self-pay | Admitting: Internal Medicine

## 2014-05-13 ENCOUNTER — Ambulatory Visit (HOSPITAL_BASED_OUTPATIENT_CLINIC_OR_DEPARTMENT_OTHER): Payer: Medicare Other | Admitting: Internal Medicine

## 2014-05-13 VITALS — BP 114/78 | HR 105 | Temp 98.4°F | Resp 18 | Ht 69.0 in | Wt 157.4 lb

## 2014-05-13 DIAGNOSIS — C3410 Malignant neoplasm of upper lobe, unspecified bronchus or lung: Secondary | ICD-10-CM

## 2014-05-13 DIAGNOSIS — C3492 Malignant neoplasm of unspecified part of left bronchus or lung: Secondary | ICD-10-CM

## 2014-05-13 NOTE — Progress Notes (Signed)
[facility] Telephone:([phone]   Fax:(336) [name], MD [place] [address]  DIAGNOSIS: Stage IIA (T2a., N1, M0) non-small cell lung cancer, squamous cell carcinoma presented with central left upper lobe mass as well as hilar lymphadenopathy diagnosed in January of 2015.  PRIOR THERAPY: Concurrent chemoradiation with weekly carboplatin for AUC of 2 and paclitaxel 45 mg/M2, status post 7 cycles. Last cycle was given 12/22/2013  CURRENT THERAPY: Observation.  INTERVAL HISTORY: Marco [name] 63 y.o. male returns to the clinic today for followup visit accompanied by his caregiver, Marco [name]. He has been observation for the last 5 months. He is feeling fine with no specific complaints. He denied having any nausea or vomiting, no fever or chills. He denied having any chest pain, shortness of breath, cough or hemoptysis. He denied having any significant weight loss or night sweats. He has repeat CT scan of the chest performed recently and he is here for evaluation and discussion of his scan results.  MEDICAL HISTORY: Past Medical History  Diagnosis Date  . Mental disorder     sczizophrenia;moderate retardation  . Depression   . DEMENTIA   . Hypertension     no medications, no documented history per caregiver at preadmission  . GERD (gastroesophageal reflux disease)   . Seizures   . Developmental disability     developmentaly delayed  . Hx of radiation therapy 11/07/13-12/24/13    lung,60Gy/2fx    ALLERGIES:  has No Known Allergies.  MEDICATIONS:  Current Outpatient Prescriptions  Medication Sig Dispense Refill  . donepezil (ARICEPT) 10 MG tablet Take 10 mg by mouth.      . fluticasone (FLONASE) 50 MCG/ACT nasal spray Place 2 sprays into both nostrils daily.      Marland Kitchen levETIRAcetam (KEPPRA) 750 MG tablet Take 750 mg by mouth 2 (two) times daily.      . bisacodyl (BISACODYL) 5 MG EC tablet Take 5  mg by mouth daily as needed for moderate constipation.      Marland Kitchen LORazepam (ATIVAN) 1 MG tablet Take 2 mg by mouth at bedtime.      Marland Kitchen omeprazole (PRILOSEC) 20 MG capsule Take 20 mg by mouth 2 (two) times daily.      . risperiDONE (RISPERDAL) 3 MG tablet Take 3 mg by mouth at bedtime.      . traZODone (DESYREL) 50 MG tablet Take 25 mg by mouth at bedtime.       No current facility-administered medications for this visit.    SURGICAL HISTORY:  Past Surgical History  Procedure Laterality Date  . Cataract extraction w/phaco  07/12/2011    Procedure: CATARACT EXTRACTION PHACO AND INTRAOCULAR LENS PLACEMENT (IOC);  Surgeon: Adonis Brook;  Location: [facility];  Service: Ophthalmology;  Laterality: Left;  Marland Kitchen Eye surgery      L eye  . Video bronchoscopy Bilateral 10/01/2013    Procedure: VIDEO BRONCHOSCOPY WITHOUT FLUORO;  Surgeon: Collene Gobble, MD;  Location: [facility];  Service: Cardiopulmonary;  Laterality: Bilateral;    REVIEW OF SYSTEMS:  Constitutional: negative Eyes: negative Ears, nose, mouth, throat, and face: negative Respiratory: negative Cardiovascular: negative Gastrointestinal: negative Genitourinary:negative Integument/breast: negative Hematologic/lymphatic: negative Musculoskeletal:negative Neurological: negative Behavioral/Psych: negative Endocrine: negative Allergic/Immunologic: negative   PHYSICAL EXAMINATION: General appearance: alert, cooperative and no distress Head: Normocephalic, without obvious abnormality, atraumatic Neck: no adenopathy, no JVD, supple, symmetrical, trachea midline and thyroid not enlarged, symmetric, no tenderness/mass/nodules Lymph nodes:  Cervical, supraclavicular, and axillary nodes normal. Resp: clear to auscultation bilaterally Back: symmetric, no curvature. ROM normal. No CVA tenderness. Cardio: regular rate and rhythm, S1, S2 normal, no murmur, click, rub or gallop GI: soft, non-tender; bowel sounds normal; no masses,  no  organomegaly Extremities: extremities normal, atraumatic, no cyanosis or edema Neurologic: Alert and oriented X 3, normal strength and tone. Normal symmetric reflexes. Normal coordination and gait  ECOG PERFORMANCE STATUS: 1 - Symptomatic but completely ambulatory  There were no vitals taken for this visit.  LABORATORY DATA: Lab Results  Component Value Date   WBC 7.4 05/06/2014   HGB 13.2 05/06/2014   HCT 40.5 05/06/2014   MCV 87.4 05/06/2014   PLT 184 05/06/2014      Chemistry      Component Value Date/Time   NA 143 05/06/2014 0927   NA 143 09/10/2013 0551   K 3.9 05/06/2014 0927   K 3.6* 09/10/2013 0551   CL 104 09/10/2013 0551   CO2 29 05/06/2014 0927   CO2 21 09/10/2013 0551   BUN 16.5 05/06/2014 0927   BUN 17 09/10/2013 0551   CREATININE 1.1 05/06/2014 0927   CREATININE 0.90 09/10/2013 0551      Component Value Date/Time   CALCIUM 8.9 05/06/2014 0927   CALCIUM 9.3 09/10/2013 0551   ALKPHOS 72 05/06/2014 0927   ALKPHOS 64 09/09/2013 0415   AST 18 05/06/2014 0927   AST 56* 09/09/2013 0415   ALT 10 05/06/2014 0927   ALT 27 09/09/2013 0415   BILITOT 0.37 05/06/2014 0927   BILITOT 0.7 09/09/2013 0415       RADIOGRAPHIC STUDIES: Ct Chest W Contrast  05/06/2014   CLINICAL DATA:  Lung cancer.  Prior radiation April 2015.  EXAM: CT CHEST WITH CONTRAST  TECHNIQUE: Multidetector CT imaging of the chest was performed during intravenous contrast administration.  CONTRAST:  27mL OMNIPAQUE IOHEXOL 300 MG/ML  SOLN  COMPARISON:  01/27/2014  FINDINGS: Central left upper lobe cavitary mass again noted. This measures 2.5 x 1.6 cm, overall similar to prior study (2.3 x 1.9 cm). Increasing airspace disease noted in the left upper lobe and adjacent superior segment of the left lower lobe.  No pleural effusion.  No new or enlarging pulmonary nodules.  Insert Heart trace pericardial effusion, new since prior study. No mediastinal, hilar or axillary adenopathy.  Chest wall soft tissues are unremarkable. Imaging into the upper abdomen  shows no acute findings.  No acute bony abnormality or focal bone lesion.  IMPRESSION: Increasing ground-glass airspace opacities in the left upper lobe and superior segment of the left lower lobe. This could represent postradiation change although there has reportedly been no interval radiation treatment. Inflammatory/infectious causes cannot be excluded. Recommend attention on followup imaging.  Stable central partially cavitary left upper lobe nodule.   Electronically Signed   By: Rolm Baptise M.D.   On: 05/06/2014 12:01   ASSESSMENT AND PLAN: This is a very pleasant 63 years old [place] American male with unresectable a stage IIA non-small cell lung cancer. He completed a course of concurrent chemoradiation with weekly carboplatin and paclitaxel is status post 7 cycles. Has been observation since his treatment in April 2015.  His recent CT scan of the chest showed no evidence for disease progression. I discussed the scan results and showed the images to the patient and his caregiver. I recommended for him to continue on observation. I will see him back for followup visit in 3 months with repeat CT scan of  the chest. He was advised to call immediately if he has any concerning symptoms in the interval. The patient voices understanding of current disease status and treatment options and is in agreement with the current care plan.  All questions were answered. The patient knows to call the clinic with any problems, questions or concerns. We can certainly see the patient much sooner if necessary.  Disclaimer: This note was dictated with voice recognition software. Similar sounding words can inadvertently be transcribed and may not be corrected upon review.

## 2014-05-15 ENCOUNTER — Telehealth: Payer: Self-pay | Admitting: Internal Medicine

## 2014-05-15 NOTE — Telephone Encounter (Signed)
s.w. with Zakiyyah his poa and advised on Dec appt....Marland Kitchenok and aware.

## 2014-06-18 ENCOUNTER — Encounter: Payer: Self-pay | Admitting: *Deleted

## 2014-08-12 ENCOUNTER — Ambulatory Visit (HOSPITAL_COMMUNITY)
Admission: RE | Admit: 2014-08-12 | Discharge: 2014-08-12 | Disposition: A | Discharge status: Home / Self Care | Payer: Medicare Other | Source: Ambulatory Visit | Attending: Internal Medicine | Admitting: Internal Medicine

## 2014-08-12 ENCOUNTER — Other Ambulatory Visit (HOSPITAL_BASED_OUTPATIENT_CLINIC_OR_DEPARTMENT_OTHER): Payer: Medicare Other

## 2014-08-12 DIAGNOSIS — C3492 Malignant neoplasm of unspecified part of left bronchus or lung: Secondary | ICD-10-CM

## 2014-08-12 DIAGNOSIS — C3412 Malignant neoplasm of upper lobe, left bronchus or lung: Secondary | ICD-10-CM | POA: Diagnosis not present

## 2014-08-12 DIAGNOSIS — C3410 Malignant neoplasm of upper lobe, unspecified bronchus or lung: Secondary | ICD-10-CM

## 2014-08-12 LAB — CBC WITH DIFFERENTIAL/PLATELET
BASO%: 0.9 % (ref 0.0–2.0)
Basophils Absolute: 0.1 10*3/uL (ref 0.0–0.1)
EOS ABS: 0 10*3/uL (ref 0.0–0.5)
EOS%: 0.4 % (ref 0.0–7.0)
HCT: 43.4 % (ref 38.4–49.9)
HGB: 13.8 g/dL (ref 13.0–17.1)
LYMPH#: 1 10*3/uL (ref 0.9–3.3)
LYMPH%: 17.6 % (ref 14.0–49.0)
MCH: 28.3 pg (ref 27.2–33.4)
MCHC: 31.7 g/dL — ABNORMAL LOW (ref 32.0–36.0)
MCV: 89.2 fL (ref 79.3–98.0)
MONO#: 0.7 10*3/uL (ref 0.1–0.9)
MONO%: 11.2 % (ref 0.0–14.0)
NEUT%: 69.9 % (ref 39.0–75.0)
NEUTROS ABS: 4.1 10*3/uL (ref 1.5–6.5)
Platelets: 168 10*3/uL (ref 140–400)
RBC: 4.87 10*6/uL (ref 4.20–5.82)
RDW: 14.5 % (ref 11.0–14.6)
WBC: 5.9 10*3/uL (ref 4.0–10.3)

## 2014-08-12 LAB — COMPREHENSIVE METABOLIC PANEL (CC13)
ALBUMIN: 3.8 g/dL (ref 3.5–5.0)
ALT: 11 U/L (ref 0–55)
ANION GAP: 10 meq/L (ref 3–11)
AST: 11 U/L (ref 5–34)
Alkaline Phosphatase: 74 U/L (ref 40–150)
BUN: 15.3 mg/dL (ref 7.0–26.0)
CHLORIDE: 106 meq/L (ref 98–109)
CO2: 28 meq/L (ref 22–29)
Calcium: 9.3 mg/dL (ref 8.4–10.4)
Creatinine: 1.2 mg/dL (ref 0.7–1.3)
EGFR: 76 mL/min/{1.73_m2} — AB (ref >90)
GLUCOSE: 72 mg/dL (ref 70–140)
Potassium: 3.9 mEq/L (ref 3.5–5.1)
SODIUM: 143 meq/L (ref 136–145)
TOTAL PROTEIN: 6.9 g/dL (ref 6.4–8.3)
Total Bilirubin: 0.34 mg/dL (ref 0.20–1.20)

## 2014-08-12 MED ORDER — IOHEXOL 300 MG/ML  SOLN
100.0000 mL | Freq: Once | INTRAMUSCULAR | Status: AC | PRN
  Administered 2014-08-12: 100 mL via INTRAVENOUS

## 2014-08-19 ENCOUNTER — Ambulatory Visit: Payer: Medicare Other | Admitting: Internal Medicine

## 2014-08-25 ENCOUNTER — Telehealth: Payer: Self-pay | Admitting: Internal Medicine

## 2014-08-25 NOTE — Telephone Encounter (Signed)
Marco [name] and advised on r/s appt...oko and aware

## 2014-09-16 ENCOUNTER — Encounter: Payer: Self-pay | Admitting: Internal Medicine

## 2014-09-16 ENCOUNTER — Telehealth: Payer: Self-pay | Admitting: Internal Medicine

## 2014-09-16 ENCOUNTER — Ambulatory Visit (HOSPITAL_BASED_OUTPATIENT_CLINIC_OR_DEPARTMENT_OTHER): Payer: Medicare Other | Admitting: Internal Medicine

## 2014-09-16 VITALS — BP 125/86 | HR 94 | Temp 98.6°F | Resp 19 | Ht 69.0 in | Wt 158.1 lb

## 2014-09-16 DIAGNOSIS — C3410 Malignant neoplasm of upper lobe, unspecified bronchus or lung: Secondary | ICD-10-CM

## 2014-09-16 DIAGNOSIS — C3492 Malignant neoplasm of unspecified part of left bronchus or lung: Secondary | ICD-10-CM

## 2014-09-16 DIAGNOSIS — I10 Essential (primary) hypertension: Secondary | ICD-10-CM

## 2014-09-16 NOTE — Progress Notes (Signed)
[facility] Telephone:([phone]   Fax:([phone]  OFFICE PROGRESS NOTE  [name],[name], MD [address] [place] [address]  DIAGNOSIS: Stage IIA (T2a., N1, M0) non-small cell lung cancer, squamous cell carcinoma presented with central left upper lobe mass as well as hilar lymphadenopathy diagnosed in January of 2015.  PRIOR THERAPY: Concurrent chemoradiation with weekly carboplatin for AUC of 2 and paclitaxel 45 mg/M2, status post 7 cycles. Last cycle was given 12/22/2013  CURRENT THERAPY: Observation.  INTERVAL HISTORY: Marco [name] 64 y.o. male returns to the clinic today for followup visit accompanied by his caregiver, Colletta [place]. He has been observation for the last 9 months and doing well with no complaints. He denied having any nausea or vomiting, no fever or chills. He denied having any chest pain, shortness of breath, cough or hemoptysis. He denied having any significant weight loss or night sweats. He has repeat CT scan of the chest performed recently and he is here for evaluation and discussion of his scan results.  MEDICAL HISTORY: Past Medical History  Diagnosis Date  . Mental disorder     sczizophrenia;moderate retardation  . Depression   . DEMENTIA   . Hypertension     no medications, no documented history per caregiver at preadmission  . GERD (gastroesophageal reflux disease)   . Seizures   . Developmental disability     developmentaly delayed  . Hx of radiation therapy 11/07/13-12/24/13    lung,60Gy/75fx    ALLERGIES:  has No Known Allergies.  MEDICATIONS:  Current Outpatient Prescriptions  Medication Sig Dispense Refill  . bisacodyl (BISACODYL) 5 MG EC tablet Take 5 mg by mouth daily as needed for moderate constipation.    Marland Kitchen donepezil (ARICEPT) 10 MG tablet Take 10 mg by mouth.    . fluticasone (FLONASE) 50 MCG/ACT nasal spray Place 2 sprays into both nostrils daily.    Marland Kitchen levETIRAcetam (KEPPRA) 750 MG tablet  Take 750 mg by mouth 2 (two) times daily.    Marland Kitchen LORazepam (ATIVAN) 1 MG tablet Take 2 mg by mouth at bedtime.    Marland Kitchen omeprazole (PRILOSEC) 20 MG capsule Take 20 mg by mouth 2 (two) times daily.    . risperiDONE (RISPERDAL) 3 MG tablet Take 3 mg by mouth at bedtime.    . traZODone (DESYREL) 50 MG tablet Take 25 mg by mouth at bedtime.     No current facility-administered medications for this visit.    SURGICAL HISTORY:  Past Surgical History  Procedure Laterality Date  . Cataract extraction w/phaco  07/12/2011    Procedure: CATARACT EXTRACTION PHACO AND INTRAOCULAR LENS PLACEMENT (IOC);  Surgeon: Adonis Brook;  Location: [facility];  Service: Ophthalmology;  Laterality: Left;  Marland Kitchen Eye surgery      L eye  . Video bronchoscopy Bilateral 10/01/2013    Procedure: VIDEO BRONCHOSCOPY WITHOUT FLUORO;  Surgeon: Collene Gobble, MD;  Location: [facility];  Service: Cardiopulmonary;  Laterality: Bilateral;    REVIEW OF SYSTEMS:  Constitutional: negative Eyes: negative Ears, nose, mouth, throat, and face: negative Respiratory: negative Cardiovascular: negative Gastrointestinal: negative Genitourinary:negative Integument/breast: negative Hematologic/lymphatic: negative Musculoskeletal:negative Neurological: negative Behavioral/Psych: negative Endocrine: negative Allergic/Immunologic: negative   PHYSICAL EXAMINATION: General appearance: alert, cooperative and no distress Head: Normocephalic, without obvious abnormality, atraumatic Neck: no adenopathy, no JVD, supple, symmetrical, trachea midline and thyroid not enlarged, symmetric, no tenderness/mass/nodules Lymph nodes: Cervical, supraclavicular, and axillary nodes normal. Resp: clear to auscultation bilaterally Back: symmetric, no curvature. ROM  normal. No CVA tenderness. Cardio: regular rate and rhythm, S1, S2 normal, no murmur, click, rub or gallop GI: soft, non-tender; bowel sounds normal; no masses,  no organomegaly Extremities: extremities  normal, atraumatic, no cyanosis or edema Neurologic: Alert and oriented X 3, normal strength and tone. Normal symmetric reflexes. Normal coordination and gait  ECOG PERFORMANCE STATUS: 1 - Symptomatic but completely ambulatory  Blood pressure 125/86, pulse 94, temperature 98.6 F (37 C), temperature source Oral, resp. rate 19, height 5\' 9"  (1.753 m), weight 158 lb 1.6 oz (71.714 kg), SpO2 100 %.  LABORATORY DATA: Lab Results  Component Value Date   WBC 5.9 08/12/2014   HGB 13.8 08/12/2014   HCT 43.4 08/12/2014   MCV 89.2 08/12/2014   PLT 168 08/12/2014      Chemistry      Component Value Date/Time   NA 143 08/12/2014 0900   NA 143 09/10/2013 0551   K 3.9 08/12/2014 0900   K 3.6* 09/10/2013 0551   CL 104 09/10/2013 0551   CO2 28 08/12/2014 0900   CO2 21 09/10/2013 0551   BUN 15.3 08/12/2014 0900   BUN 17 09/10/2013 0551   CREATININE 1.2 08/12/2014 0900   CREATININE 0.90 09/10/2013 0551      Component Value Date/Time   CALCIUM 9.3 08/12/2014 0900   CALCIUM 9.3 09/10/2013 0551   ALKPHOS 74 08/12/2014 0900   ALKPHOS 64 09/09/2013 0415   AST 11 08/12/2014 0900   AST 56* 09/09/2013 0415   ALT 11 08/12/2014 0900   ALT 27 09/09/2013 0415   BILITOT 0.34 08/12/2014 0900   BILITOT 0.7 09/09/2013 0415       RADIOGRAPHIC STUDIES: Ct Chest W Contrast  08/12/2014   CLINICAL DATA:  Non-small cell squamous carcinoma of the lung.  EXAM: CT CHEST WITH CONTRAST  TECHNIQUE: Multidetector CT imaging of the chest was performed during intravenous contrast administration.  CONTRAST:  115mL OMNIPAQUE IOHEXOL 300 MG/ML  SOLN  COMPARISON:  CT 05/06/2014  FINDINGS: Left upper lobe mass measuring 32 by 18 mm is increased from 25 x 16 mm on 05/06/2014. Previously there was a cavitary nodule at this site now lesion is solid. There is linear thickening lateral to the mass unchanged. There is perihilar consolidation with air bronchograms inferior to the mass seen on image 27 which is slightly  improved.  There is no measurable mediastinal lymphadenopathy. No hilar lymphadenopathy. No central pulmonary embolism.  Review of the right lung demonstrates no discrete nodularity.  There is no axillary supraclavicular lymphadenopathy.  Limited view of the liver, kidneys, pancreas are unremarkable. No aggressive osseous lesion.  IMPRESSION: 1. Left upper lobe suprahilar mass has increased in volume compared to 05/06/2014 concerning for lung cancer recurrence. This mass corresponds to the previously described cavitary nodule and site of hypermetabolic carcinoma on PET-CT of 09/22/2013. 2. Interval decrease in the airspace disease in the superior segment of the left lower lobe. 3. No evidence of mediastinal nodal metastasis.   Electronically Signed   By: Suzy Bouchard M.D.   On: 08/12/2014 11:39   ASSESSMENT AND PLAN: This is a very pleasant 64 years old [place] American male with unresectable a stage IIA non-small cell lung cancer. He completed a course of concurrent chemoradiation with weekly carboplatin and paclitaxel is status post 7 cycles. Has been observation since his treatment in April 2015.  His recent CT scan of the chest showed stable disease except for enlarging left upper lobe suprahilar mass concerning for disease recurrence. I discussed  the scan results and showed the images to the patient and his caregiver. I recommended for him to continue on observation, but I will repeat CT scan of the chest in 2 months for further evaluation of this lesion. If it showed any further disease progression, would consider the patient for either PET scan or proceeding with systemic chemotherapy. He was advised to call immediately if he has any concerning symptoms in the interval. The patient voices understanding of current disease status and treatment options and is in agreement with the current care plan.  All questions were answered. The patient knows to call the clinic with any problems, questions or  concerns. We can certainly see the patient much sooner if necessary.  Disclaimer: This note was dictated with voice recognition software. Similar sounding words can inadvertently be transcribed and may not be corrected upon review.

## 2014-09-16 NOTE — Telephone Encounter (Signed)
Gave avs & cal for March.

## 2014-09-16 NOTE — Telephone Encounter (Signed)
S/w pt's daughter confirmed labs moved up before CT Chest at next visit.... KJ

## 2014-09-23 ENCOUNTER — Other Ambulatory Visit: Payer: Self-pay | Admitting: Neurology

## 2014-09-23 NOTE — Telephone Encounter (Signed)
Former Occupational psychologist patient assigned to Dr Jaynee Eagles.  Has appt scheduled

## 2014-10-07 ENCOUNTER — Encounter (HOSPITAL_COMMUNITY): Payer: Self-pay | Admitting: Emergency Medicine

## 2014-10-07 ENCOUNTER — Inpatient Hospital Stay (HOSPITAL_COMMUNITY)
Admission: EM | Admit: 2014-10-07 | Discharge: 2014-10-13 | DRG: 101 | Disposition: A | Discharge status: Home / Self Care | Payer: Medicare Other | Attending: Internal Medicine | Admitting: Internal Medicine

## 2014-10-07 ENCOUNTER — Emergency Department (HOSPITAL_COMMUNITY): Payer: Medicare Other

## 2014-10-07 DIAGNOSIS — K219 Gastro-esophageal reflux disease without esophagitis: Secondary | ICD-10-CM | POA: Diagnosis present

## 2014-10-07 DIAGNOSIS — F79 Unspecified intellectual disabilities: Secondary | ICD-10-CM | POA: Diagnosis not present

## 2014-10-07 DIAGNOSIS — Z79899 Other long term (current) drug therapy: Secondary | ICD-10-CM

## 2014-10-07 DIAGNOSIS — R4182 Altered mental status, unspecified: Secondary | ICD-10-CM | POA: Insufficient documentation

## 2014-10-07 DIAGNOSIS — C801 Malignant (primary) neoplasm, unspecified: Secondary | ICD-10-CM

## 2014-10-07 DIAGNOSIS — F39 Unspecified mood [affective] disorder: Secondary | ICD-10-CM | POA: Diagnosis present

## 2014-10-07 DIAGNOSIS — Z87891 Personal history of nicotine dependence: Secondary | ICD-10-CM | POA: Diagnosis not present

## 2014-10-07 DIAGNOSIS — C3490 Malignant neoplasm of unspecified part of unspecified bronchus or lung: Secondary | ICD-10-CM | POA: Diagnosis present

## 2014-10-07 DIAGNOSIS — R262 Difficulty in walking, not elsewhere classified: Secondary | ICD-10-CM | POA: Diagnosis present

## 2014-10-07 DIAGNOSIS — G40909 Epilepsy, unspecified, not intractable, without status epilepticus: Secondary | ICD-10-CM

## 2014-10-07 DIAGNOSIS — R269 Unspecified abnormalities of gait and mobility: Secondary | ICD-10-CM | POA: Diagnosis present

## 2014-10-07 DIAGNOSIS — G40301 Generalized idiopathic epilepsy and epileptic syndromes, not intractable, with status epilepticus: Secondary | ICD-10-CM | POA: Diagnosis present

## 2014-10-07 DIAGNOSIS — G40901 Epilepsy, unspecified, not intractable, with status epilepticus: Secondary | ICD-10-CM | POA: Diagnosis not present

## 2014-10-07 HISTORY — DX: Unspecified intellectual disabilities: F79

## 2014-10-07 HISTORY — DX: Malignant neoplasm of unspecified part of unspecified bronchus or lung: C34.90

## 2014-10-07 LAB — COMPREHENSIVE METABOLIC PANEL
ALT: 9 U/L (ref 0–53)
AST: 20 U/L (ref 0–37)
Albumin: 3.8 g/dL (ref 3.5–5.2)
Alkaline Phosphatase: 71 U/L (ref 39–117)
Anion gap: 8 (ref 5–15)
BILIRUBIN TOTAL: 0.4 mg/dL (ref 0.3–1.2)
BUN: 11 mg/dL (ref 6–23)
CALCIUM: 8.9 mg/dL (ref 8.4–10.5)
CO2: 23 mmol/L (ref 19–32)
Chloride: 109 mmol/L (ref 96–112)
Creatinine, Ser: 1.54 mg/dL — ABNORMAL HIGH (ref 0.50–1.35)
GFR calc Af Amer: 53 mL/min — ABNORMAL LOW (ref >90)
GFR calc non Af Amer: 46 mL/min — ABNORMAL LOW (ref >90)
Glucose, Bld: 221 mg/dL — ABNORMAL HIGH (ref 70–99)
Potassium: 4.9 mmol/L (ref 3.5–5.1)
SODIUM: 140 mmol/L (ref 135–145)
TOTAL PROTEIN: 6.6 g/dL (ref 6.0–8.3)

## 2014-10-07 LAB — CBC WITH DIFFERENTIAL/PLATELET
BASOS ABS: 0 10*3/uL (ref 0.0–0.1)
Basophils Relative: 0 % (ref 0–1)
EOS ABS: 0 10*3/uL (ref 0.0–0.7)
Eosinophils Relative: 0 % (ref 0–5)
HCT: 41.8 % (ref 39.0–52.0)
Hemoglobin: 13.9 g/dL (ref 13.0–17.0)
LYMPHS PCT: 5 % — AB (ref 12–46)
Lymphs Abs: 0.5 10*3/uL — ABNORMAL LOW (ref 0.7–4.0)
MCH: 29.2 pg (ref 26.0–34.0)
MCHC: 33.3 g/dL (ref 30.0–36.0)
MCV: 87.8 fL (ref 78.0–100.0)
MONO ABS: 0.5 10*3/uL (ref 0.1–1.0)
Monocytes Relative: 4 % (ref 3–12)
NEUTROS PCT: 91 % — AB (ref 43–77)
Neutro Abs: 10 10*3/uL — ABNORMAL HIGH (ref 1.7–7.7)
Platelets: 164 10*3/uL (ref 150–400)
RBC: 4.76 MIL/uL (ref 4.22–5.81)
RDW: 13.4 % (ref 11.5–15.5)
WBC: 11.1 10*3/uL — ABNORMAL HIGH (ref 4.0–10.5)

## 2014-10-07 LAB — I-STAT CHEM 8, ED
BUN: 14 mg/dL (ref 6–23)
CHLORIDE: 102 mmol/L (ref 96–112)
Calcium, Ion: 1.19 mmol/L (ref 1.13–1.30)
Creatinine, Ser: 1.3 mg/dL (ref 0.50–1.35)
GLUCOSE: 219 mg/dL — AB (ref 70–99)
HCT: 46 % (ref 39.0–52.0)
HEMOGLOBIN: 15.6 g/dL (ref 13.0–17.0)
Potassium: 4.5 mmol/L (ref 3.5–5.1)
Sodium: 142 mmol/L (ref 135–145)
TCO2: 21 mmol/L (ref 0–100)

## 2014-10-07 NOTE — ED Notes (Signed)
Per EMS Patient witnessed by family having a seizure that lasted for 2 minutes. Patient was unresponsive. Patient had another seizure lasting 40 seconds with [facility]. Upon arrival patient is unresponsive.  BP 115/71 HR 120 RR 14 SpO2 100% CBG 263

## 2014-10-07 NOTE — ED Notes (Signed)
MD Ralene Bathe in hallway with pt and ems.

## 2014-10-07 NOTE — ED Provider Notes (Signed)
CSN: [id number]     Arrival date & time 10/07/14  2014 History   First MD Initiated Contact with Patient 10/07/14 2015     No chief complaint on file.    The history is provided by the EMS personnel. No language interpreter was used.   Mr. Osment presents for evaluation of seizures. Level V caveat due to altered mental status. Per report patient had a witnessed seizure event that lasted about 2 minutes. For EMS he had about 40 seconds of generalized seizure-like activity and was given 5 mg of movement as Lam IM. The patient has a history of MR and seizure disorder and is compliant with his medications. His last seizure was about one year ago. There is no history of recent illnesses or injuries.  No past medical history on file. No past surgical history on file. No family history on file. History  Substance Use Topics  . Smoking status: Not on file  . Smokeless tobacco: Not on file  . Alcohol Use: Not on file    Review of Systems  Unable to perform ROS     Allergies  Review of patient's allergies indicates not on file.  Home Medications   Prior to Admission medications   Not on File   There were no vitals taken for this visit. Physical Exam  Constitutional: He appears well-developed and well-nourished.  HENT:  Head: Normocephalic and atraumatic.  Eyes:  Left pupil 3-4 mm and reactive, right pupil 2-3 mm and reactive  Cardiovascular:  Tachycardic, no murmur  Pulmonary/Chest: Effort normal and breath sounds normal.  Abdominal: Soft. There is no tenderness. There is no rebound and no guarding.  Musculoskeletal: He exhibits no edema or tenderness.  Neurological:  Lethargic, responds to deep painful stimuli  Skin: Skin is warm and dry.  Psychiatric:  Unable to assess  Nursing note and vitals reviewed.   ED Course  Procedures (including critical care time) Labs Review Labs Reviewed  COMPREHENSIVE METABOLIC PANEL - Abnormal; Notable for the following:    Glucose,  Bld 221 (*)    Creatinine, Ser 1.54 (*)    GFR calc non Af Amer 46 (*)    GFR calc Af Amer 53 (*)    All other components within normal limits  CBC WITH DIFFERENTIAL/PLATELET - Abnormal; Notable for the following:    WBC 11.1 (*)    Neutrophils Relative % 91 (*)    Neutro Abs 10.0 (*)    Lymphocytes Relative 5 (*)    Lymphs Abs 0.5 (*)    All other components within normal limits  URINALYSIS, ROUTINE W REFLEX MICROSCOPIC - Abnormal; Notable for the following:    Color, Urine STRAW (*)    All other components within normal limits  CBC WITH DIFFERENTIAL/PLATELET - Abnormal; Notable for the following:    WBC 15.3 (*)    Neutrophils Relative % 92 (*)    Neutro Abs 14.1 (*)    Lymphocytes Relative 4 (*)    Lymphs Abs 0.6 (*)    All other components within normal limits  COMPREHENSIVE METABOLIC PANEL - Abnormal; Notable for the following:    Glucose, Bld 127 (*)    GFR calc non Af Amer 62 (*)    GFR calc Af Amer 72 (*)    All other components within normal limits  AMMONIA - Abnormal; Notable for the following:    Ammonia 41 (*)    All other components within normal limits  TSH - Abnormal; Notable for the following:  TSH 0.287 (*)    All other components within normal limits  CK - Abnormal; Notable for the following:    Total CK 449 (*)    All other components within normal limits  URINE RAPID DRUG SCREEN (HOSP PERFORMED) - Abnormal; Notable for the following:    Benzodiazepines POSITIVE (*)    All other components within normal limits  ACETAMINOPHEN LEVEL - Abnormal; Notable for the following:    Acetaminophen (Tylenol), Serum <10.0 (*)    All other components within normal limits  I-STAT CHEM 8, ED - Abnormal; Notable for the following:    Glucose, Bld 219 (*)    All other components within normal limits  LACTIC ACID, PLASMA  ETHANOL  PROLACTIN    Imaging Review Ct Head W Wo Contrast  10/08/2014   CLINICAL DATA:  Weakness seizure lasting 2 minutes, subsequent  unresponsive and followup seizure. Unresponsive.  EXAM: CT HEAD WITHOUT AND WITH CONTRAST  TECHNIQUE: Contiguous axial images were obtained from the base of the skull through the vertex without and with intravenous contrast  CONTRAST:  82mL OMNIPAQUE IOHEXOL 300 MG/ML  SOLN  COMPARISON:  None available for comparison at time of study interpretation.  FINDINGS: The ventricles and sulci are normal for age. No intraparenchymal hemorrhage, mass effect nor midline shift. Patchy supratentorial white matter hypodensities are less than expected for patient's age and though non-specific suggest sequelae of chronic small vessel ischemic disease. No acute large vascular territory infarcts. RIGHT inferior basal ganglia perivascular space. No abnormal parenchymal or extra-axial enhancement.  No abnormal extra-axial fluid collections. Basal cisterns are patent. Moderate calcific atherosclerosis of the carotid siphons.  No skull fracture. The included ocular globes and orbital contents are non-suspicious. The mastoid aircells and included paranasal sinuses are well-aerated.  IMPRESSION: No acute intracranial process ; normal CT of the head with and without contrast for age.   Electronically Signed   By: Elon Alas   On: 10/08/2014 03:45   Dg Chest Port 1 View  10/07/2014   CLINICAL DATA:  Acute onset of seizure.  Initial encounter.  EXAM: PORTABLE CHEST - 1 VIEW  COMPARISON:  None.  FINDINGS: The lungs are well-aerated. Left midlung airspace opacification is compatible with pneumonia. There is no evidence of pleural effusion or pneumothorax.  The cardiomediastinal silhouette is within normal limits. No acute osseous abnormalities are seen.  IMPRESSION: Left midlung pneumonia noted.   Electronically Signed   By: Garald Balding M.D.   On: 10/07/2014 22:59     EKG Interpretation None      MDM   Final diagnoses:  Altered mental status  Status epilepticus    Pt with hx/o MR and seizure disorder here for  evaluation of seizure. The patient is initially unresponsive in the emergency department, but he had just received the midazolam prior to ED evaluation. On recheck in the department patient's is close to his baseline per caregiver, he just appears a little more sleepy than usual. Plan to check urinalysis and ambulate patient to evaluate for possible infection as triggering seizure.    Quintella Reichert, MD 10/08/14 1455

## 2014-10-08 ENCOUNTER — Inpatient Hospital Stay (HOSPITAL_COMMUNITY): Payer: Medicare Other

## 2014-10-08 ENCOUNTER — Emergency Department (HOSPITAL_COMMUNITY): Payer: Medicare Other

## 2014-10-08 ENCOUNTER — Encounter (HOSPITAL_COMMUNITY): Payer: Self-pay | Admitting: Radiology

## 2014-10-08 DIAGNOSIS — F79 Unspecified intellectual disabilities: Secondary | ICD-10-CM

## 2014-10-08 DIAGNOSIS — G40901 Epilepsy, unspecified, not intractable, with status epilepticus: Secondary | ICD-10-CM | POA: Diagnosis present

## 2014-10-08 DIAGNOSIS — C3490 Malignant neoplasm of unspecified part of unspecified bronchus or lung: Secondary | ICD-10-CM

## 2014-10-08 DIAGNOSIS — G40301 Generalized idiopathic epilepsy and epileptic syndromes, not intractable, with status epilepticus: Secondary | ICD-10-CM

## 2014-10-08 DIAGNOSIS — F39 Unspecified mood [affective] disorder: Secondary | ICD-10-CM | POA: Diagnosis present

## 2014-10-08 DIAGNOSIS — Z87891 Personal history of nicotine dependence: Secondary | ICD-10-CM | POA: Diagnosis not present

## 2014-10-08 DIAGNOSIS — G40909 Epilepsy, unspecified, not intractable, without status epilepticus: Secondary | ICD-10-CM

## 2014-10-08 DIAGNOSIS — K219 Gastro-esophageal reflux disease without esophagitis: Secondary | ICD-10-CM | POA: Diagnosis not present

## 2014-10-08 DIAGNOSIS — Z79899 Other long term (current) drug therapy: Secondary | ICD-10-CM | POA: Diagnosis not present

## 2014-10-08 DIAGNOSIS — R262 Difficulty in walking, not elsewhere classified: Secondary | ICD-10-CM | POA: Diagnosis present

## 2014-10-08 DIAGNOSIS — R269 Unspecified abnormalities of gait and mobility: Secondary | ICD-10-CM | POA: Diagnosis present

## 2014-10-08 LAB — ETHANOL: Alcohol, Ethyl (B): <5 mg/dL (ref 0–9)

## 2014-10-08 LAB — URINALYSIS, ROUTINE W REFLEX MICROSCOPIC
Bilirubin Urine: NEGATIVE
Glucose, UA: NEGATIVE mg/dL
Hgb urine dipstick: NEGATIVE
KETONES UR: NEGATIVE mg/dL
Leukocytes, UA: NEGATIVE
NITRITE: NEGATIVE
Protein, ur: NEGATIVE mg/dL
SPECIFIC GRAVITY, URINE: 1.019 (ref 1.005–1.030)
UROBILINOGEN UA: 1 mg/dL (ref 0.0–1.0)
pH: 5 (ref 5.0–8.0)

## 2014-10-08 LAB — CBC WITH DIFFERENTIAL/PLATELET
BASOS ABS: 0 10*3/uL (ref 0.0–0.1)
Basophils Relative: 0 % (ref 0–1)
EOS ABS: 0 10*3/uL (ref 0.0–0.7)
Eosinophils Relative: 0 % (ref 0–5)
HCT: 43.3 % (ref 39.0–52.0)
HEMOGLOBIN: 14.4 g/dL (ref 13.0–17.0)
LYMPHS PCT: 4 % — AB (ref 12–46)
Lymphs Abs: 0.6 10*3/uL — ABNORMAL LOW (ref 0.7–4.0)
MCH: 29.2 pg (ref 26.0–34.0)
MCHC: 33.3 g/dL (ref 30.0–36.0)
MCV: 87.8 fL (ref 78.0–100.0)
MONO ABS: 0.6 10*3/uL (ref 0.1–1.0)
MONOS PCT: 4 % (ref 3–12)
NEUTROS PCT: 92 % — AB (ref 43–77)
Neutro Abs: 14.1 10*3/uL — ABNORMAL HIGH (ref 1.7–7.7)
Platelets: 173 10*3/uL (ref 150–400)
RBC: 4.93 MIL/uL (ref 4.22–5.81)
RDW: 13.6 % (ref 11.5–15.5)
WBC: 15.3 10*3/uL — AB (ref 4.0–10.5)

## 2014-10-08 LAB — RAPID URINE DRUG SCREEN, HOSP PERFORMED
Amphetamines: NOT DETECTED
BARBITURATES: NOT DETECTED
Benzodiazepines: POSITIVE — AB
Cocaine: NOT DETECTED
Opiates: NOT DETECTED
Tetrahydrocannabinol: NOT DETECTED

## 2014-10-08 LAB — COMPREHENSIVE METABOLIC PANEL
ALK PHOS: 70 U/L (ref 39–117)
ALT: 21 U/L (ref 0–53)
AST: 27 U/L (ref 0–37)
Albumin: 3.9 g/dL (ref 3.5–5.2)
Anion gap: 8 (ref 5–15)
BILIRUBIN TOTAL: 0.5 mg/dL (ref 0.3–1.2)
BUN: 12 mg/dL (ref 6–23)
CALCIUM: 8.8 mg/dL (ref 8.4–10.5)
CO2: 29 mmol/L (ref 19–32)
Chloride: 104 mmol/L (ref 96–112)
Creatinine, Ser: 1.2 mg/dL (ref 0.50–1.35)
GFR calc Af Amer: 72 mL/min — ABNORMAL LOW (ref >90)
GFR, EST NON AFRICAN AMERICAN: 62 mL/min — AB (ref >90)
Glucose, Bld: 127 mg/dL — ABNORMAL HIGH (ref 70–99)
POTASSIUM: 3.9 mmol/L (ref 3.5–5.1)
SODIUM: 141 mmol/L (ref 135–145)
Total Protein: 7 g/dL (ref 6.0–8.3)

## 2014-10-08 LAB — MRSA PCR SCREENING: MRSA BY PCR: NEGATIVE

## 2014-10-08 LAB — ACETAMINOPHEN LEVEL: Acetaminophen (Tylenol), Serum: <10 ug/mL — ABNORMAL LOW (ref 10–30)

## 2014-10-08 LAB — TSH: TSH: 0.287 u[IU]/mL — AB (ref 0.350–4.500)

## 2014-10-08 LAB — CK: CK TOTAL: 449 U/L — AB (ref 7–232)

## 2014-10-08 LAB — LACTIC ACID, PLASMA: Lactic Acid, Venous: 2 mmol/L (ref 0.5–2.0)

## 2014-10-08 LAB — AMMONIA: Ammonia: 41 umol/L — ABNORMAL HIGH (ref 11–32)

## 2014-10-08 MED ORDER — ONDANSETRON HCL 4 MG/2ML IJ SOLN: 4.0000 mg | Freq: Four times a day (QID) | INTRAMUSCULAR | Status: DC | PRN

## 2014-10-08 MED ORDER — LORAZEPAM 2 MG/ML IJ SOLN: 2.0000 mg | INTRAMUSCULAR | Status: DC | PRN

## 2014-10-08 MED ORDER — THIAMINE HCL 100 MG/ML IJ SOLN
Freq: Once | INTRAVENOUS | Status: AC
  Administered 2014-10-08: 08:00:00 via INTRAVENOUS
  Filled 2014-10-08: qty 1000

## 2014-10-08 MED ORDER — LEVETIRACETAM IN NACL 1000 MG/100ML IV SOLN
1000.0000 mg | Freq: Two times a day (BID) | INTRAVENOUS | Status: DC
  Administered 2014-10-08 – 2014-10-13 (×11): 1000 mg via INTRAVENOUS
  Filled 2014-10-08 (×14): qty 100

## 2014-10-08 MED ORDER — LORAZEPAM 2 MG/ML IJ SOLN
1.0000 mg | INTRAMUSCULAR | Status: DC | PRN
  Filled 2014-10-08 (×2): qty 1

## 2014-10-08 MED ORDER — LORAZEPAM 2 MG/ML IJ SOLN
0.5000 mg | Freq: Once | INTRAMUSCULAR | Status: AC
  Administered 2014-10-08: 0.5 mg via INTRAVENOUS
  Filled 2014-10-08: qty 1

## 2014-10-08 MED ORDER — LORAZEPAM 2 MG/ML IJ SOLN
2.0000 mg | INTRAMUSCULAR | Status: DC | PRN
  Administered 2014-10-09 – 2014-10-10 (×3): 2 mg via INTRAVENOUS
  Filled 2014-10-08 (×3): qty 1

## 2014-10-08 MED ORDER — SODIUM CHLORIDE 0.9 % IV BOLUS (SEPSIS)
500.0000 mL | Freq: Once | INTRAVENOUS | Status: AC
  Administered 2014-10-08: 500 mL via INTRAVENOUS

## 2014-10-08 MED ORDER — STROKE: EARLY STAGES OF RECOVERY BOOK
Freq: Once | Status: AC
  Administered 2014-10-08: 1
  Filled 2014-10-08: qty 1

## 2014-10-08 MED ORDER — LEVETIRACETAM IN NACL 1000 MG/100ML IV SOLN
1000.0000 mg | Freq: Once | INTRAVENOUS | Status: AC
  Administered 2014-10-08: 1000 mg via INTRAVENOUS
  Filled 2014-10-08: qty 100

## 2014-10-08 MED ORDER — HEPARIN SODIUM (PORCINE) 5000 UNIT/ML IJ SOLN
5000.0000 [IU] | Freq: Three times a day (TID) | INTRAMUSCULAR | Status: DC
Start: 2014-10-08 — End: 2014-10-13
  Administered 2014-10-08 – 2014-10-13 (×17): 5000 [IU] via SUBCUTANEOUS
  Filled 2014-10-08 (×22): qty 1

## 2014-10-08 MED ORDER — FLUTICASONE PROPIONATE 50 MCG/ACT NA SUSP
2.0000 | Freq: Every day | NASAL | Status: DC
  Administered 2014-10-09 – 2014-10-13 (×5): 2 via NASAL
  Filled 2014-10-08 (×2): qty 16

## 2014-10-08 MED ORDER — IOHEXOL 300 MG/ML  SOLN
75.0000 mL | Freq: Once | INTRAMUSCULAR | Status: AC | PRN
  Administered 2014-10-08: 75 mL via INTRAVENOUS

## 2014-10-08 NOTE — Progress Notes (Signed)
Same Day Note Patient seen and examined. H and p from this AM reviewed  Vitals stable Pt awake, conversant but slow to respond No focal neurologic weakness Heart regular, s1, s2 Normal resp effort  1. Status epilepticus - Neurology following - EEG with findings c/w post-ictal state with frequent sharp transients c/w hx of seizures - Continue on 1gm keppra BID per Neurology recs - Continue seizure precautions 2. Mood d/o - Seems stable, but difficult to assess as pt seems post-ictal 3. Squamous cell lung cancer - CT head w/o obvious evidence of mets - MRI brain ordered 4. DVT prophylaxis - Heparin subQ  [facility], [facility] Pager [phone]. If 7PM-7AM, please contact night-coverage at [email or web address], password [facility]

## 2014-10-08 NOTE — Consult Note (Signed)
NEURO HOSPITALIST CONSULT NOTE    Reason for Consult: Seizure  HPI:                                                                                                                                          Marco [name] is an 64 y.o. male with known history of seizure. Per tech who works at the group home he resides, " he had his first seizure last year". But there is no records of any hospitalization.  He is on Keppra 750 mg BID at home and to her knowledge he has not missed any doses.  Today he was noted to be acting unlike his normal self.  The aid sat him down and then he proceeded to have a TC seizure.  After that episode the patient was somnolent and was not responding to verbal stimuli and therefore EMS was called in.  EMS brought the patient here and here at around 4:30 patient had another episode of seizure-like activity.  Aberrantly patient had another seizure-like activity with EMS.  Patient has been persistently post ictal since his first episode and has not returned to his baseline. Patient has remained in ED over night with no further seizure. He remains to be confused and not acting himself per the tech at bedside. EEG has been obtained but not read. CT head was obtained showing no acute abnormalities. Patient is able to tell me he is at the hospital and the year is 2016 but is having difficulty following commands and continues to fall asleep.   Past Medical History  Diagnosis Date  . Mental retardation   . Seizure disorder   . Lung cancer     History reviewed. No pertinent past surgical history.  Family History  Problem Relation Age of Onset  . Hypertension Mother   . Hypertension Father      Social History:  reports that he has quit smoking. He does not have any smokeless tobacco history on file. He reports that he does not drink alcohol or use illicit drugs.  No Known Allergies  MEDICATIONS:                                                                                                                      Current Facility-Administered  Medications  Medication Dose Route Frequency Provider Last Rate Last Dose  . fluticasone (FLONASE) 50 MCG/ACT nasal spray 2 spray  2 spray Each Nare Daily Berle Mull, MD      . heparin injection 5,000 Units  5,000 Units Subcutaneous 3 times per day Berle Mull, MD   5,000 Units at 10/08/14 1438  . levETIRAcetam (KEPPRA) IVPB 1000 mg/100 mL premix  1,000 mg Intravenous Q12H Berle Mull, MD   1,000 mg at 10/08/14 0751  . LORazepam (ATIVAN) injection 1 mg  1 mg Intravenous Q4H PRN Berle Mull, MD       Current Outpatient Prescriptions  Medication Sig Dispense Refill  . bisacodyl (DULCOLAX) 5 MG EC tablet Take 5 mg by mouth daily as needed for moderate constipation.    Marland Kitchen donepezil (ARICEPT) 10 MG tablet Take 10 mg by mouth at bedtime.    . fluticasone (FLONASE) 50 MCG/ACT nasal spray Place 2 sprays into both nostrils daily.    Marland Kitchen levETIRAcetam (KEPPRA) 750 MG tablet Take 750 mg by mouth 2 (two) times daily.    Marland Kitchen LORazepam (ATIVAN) 1 MG tablet Take 2 mg by mouth at bedtime.    Marland Kitchen omeprazole (PRILOSEC) 20 MG capsule Take 20 mg by mouth 2 (two) times daily before a meal.    . risperiDONE (RISPERDAL) 3 MG tablet Take 3 mg by mouth at bedtime.        ROS:                                                                                                                                       History obtained from unobtainable from patient due to mental status  General ROS: negative for - chills, fatigue, fever, night sweats, weight gain or weight loss Psychological ROS: negative for - behavioral disorder, hallucinations, memory difficulties, mood swings or suicidal ideation Ophthalmic ROS: negative for - blurry vision, double vision, eye pain or loss of vision ENT ROS: negative for - epistaxis, nasal discharge, oral lesions, sore throat, tinnitus or vertigo Allergy and Immunology ROS: negative for - hives or  itchy/watery eyes Hematological and Lymphatic ROS: negative for - bleeding problems, bruising or swollen lymph nodes Endocrine ROS: negative for - galactorrhea, hair pattern changes, polydipsia/polyuria or temperature intolerance Respiratory ROS: negative for - cough, hemoptysis, shortness of breath or wheezing Cardiovascular ROS: negative for - chest pain, dyspnea on exertion, edema or irregular heartbeat Gastrointestinal ROS: negative for - abdominal pain, diarrhea, hematemesis, nausea/vomiting or stool incontinence Genito-Urinary ROS: negative for - dysuria, hematuria, incontinence or urinary frequency/urgency Musculoskeletal ROS: negative for - joint swelling or muscular weakness Neurological ROS: as noted in HPI Dermatological ROS: negative for rash and skin lesion changes   Blood pressure 109/68, pulse 84, temperature 100.6 F (38.1 C), temperature source Oral, resp. rate 20, SpO2 97 %.   Neurologic Examination:  HEENT-  Normocephalic, no lesions, without obvious abnormality.  Normal external eye and conjunctiva.  Normal TM's bilaterally.  Normal auditory canals and external ears. Normal external nose, mucus membranes and septum.  Normal pharynx. Cardiovascular- S1, S2 normal, pulses palpable throughout   Lungs- no tachypnea, retractions or cyanosis Abdomen- normal findings: bowel sounds normal Extremities- no edema Lymph-no adenopathy palpable Musculoskeletal-no joint tenderness, deformity or swelling Skin-warm and dry, no hyperpigmentation, vitiligo, or suspicious lesions  Neurological Examination Mental Status: Alert, oriented to hospital and year--per tech, he usually is very alert and currently he is drowsy.  Speech dysarthric without evidence of aphasia.  Able to follow simple commands but has difficulty with complex. Cranial Nerves: II: Discs flat bilaterally; blinks to  threat bilaterally, pupils equal, round, reactive to light and accommodation III,IV, VI: ptosis not present, extra-ocular motions intact bilaterally V,VII: smile symmetric, facial light touch sensation normal bilaterally VIII: hearing normal bilaterally IX,X: gag reflex present XI: bilateral shoulder shrug XII: midline tongue extension Motor: Moving all extremities antigravity.  Sensory: Pinprick and light touch intact throughout, bilaterally Deep Tendon Reflexes: 2+ and symmetric throughout Plantars: Right: downgoing   Left: downgoing Cerebellar: normal finger-to-nose, Gait: not tested due to safety    Lab Results: Basic Metabolic Panel:  Recent Labs Lab 10/07/14 2039 10/07/14 2052 10/08/14 0655  NA 140 142 141  K 4.9 4.5 3.9  CL 109 102 104  CO2 23  --  29  GLUCOSE 221* 219* 127*  BUN 11 14 12   CREATININE 1.54* 1.30 1.20  CALCIUM 8.9  --  8.8    Liver Function Tests:  Recent Labs Lab 10/07/14 2039 10/08/14 0655  AST 20 27  ALT 9 21  ALKPHOS 71 70  BILITOT 0.4 0.5  PROT 6.6 7.0  ALBUMIN 3.8 3.9   No results for input(s): LIPASE, AMYLASE in the last 168 hours.  Recent Labs Lab 10/08/14 0655  AMMONIA 41*    CBC:  Recent Labs Lab 10/07/14 2039 10/07/14 2052 10/08/14 0655  WBC 11.1*  --  15.3*  NEUTROABS 10.0*  --  14.1*  HGB 13.9 15.6 14.4  HCT 41.8 46.0 43.3  MCV 87.8  --  87.8  PLT 164  --  173    Cardiac Enzymes:  Recent Labs Lab 10/08/14 0655  CKTOTAL 449*    Lipid Panel: No results for input(s): CHOL, TRIG, HDL, CHOLHDL, VLDL, LDLCALC in the last 168 hours.  CBG: No results for input(s): GLUCAP in the last 168 hours.  Microbiology: No results found for this or any previous visit.  Coagulation Studies: No results for input(s): LABPROT, INR in the last 72 hours.  Imaging: Ct Head W Wo Contrast  10/08/2014   CLINICAL DATA:  Weakness seizure lasting 2 minutes, subsequent unresponsive and followup seizure. Unresponsive.  EXAM:  CT HEAD WITHOUT AND WITH CONTRAST  TECHNIQUE: Contiguous axial images were obtained from the base of the skull through the vertex without and with intravenous contrast  CONTRAST:  72mL OMNIPAQUE IOHEXOL 300 MG/ML  SOLN  COMPARISON:  None available for comparison at time of study interpretation.  FINDINGS: The ventricles and sulci are normal for age. No intraparenchymal hemorrhage, mass effect nor midline shift. Patchy supratentorial white matter hypodensities are less than expected for patient's age and though non-specific suggest sequelae of chronic small vessel ischemic disease. No acute large vascular territory infarcts. RIGHT inferior basal ganglia perivascular space. No abnormal parenchymal or extra-axial enhancement.  No abnormal extra-axial fluid collections. Basal cisterns are patent. Moderate calcific  atherosclerosis of the carotid siphons.  No skull fracture. The included ocular globes and orbital contents are non-suspicious. The mastoid aircells and included paranasal sinuses are well-aerated.  IMPRESSION: No acute intracranial process ; normal CT of the head with and without contrast for age.   Electronically Signed   By: Elon Alas   On: 10/08/2014 03:45   Dg Chest Port 1 View  10/07/2014   CLINICAL DATA:  Acute onset of seizure.  Initial encounter.  EXAM: PORTABLE CHEST - 1 VIEW  COMPARISON:  None.  FINDINGS: The lungs are well-aerated. Left midlung airspace opacification is compatible with pneumonia. There is no evidence of pleural effusion or pneumothorax.  The cardiomediastinal silhouette is within normal limits. No acute osseous abnormalities are seen.  IMPRESSION: Left midlung pneumonia noted.   Electronically Signed   By: Garald Balding M.D.   On: 10/07/2014 22:59       Assessment and plan per attending neurologist  Etta Quill PA-C [facility] [phone]  10/08/2014, 4:13 PM   Assessment/Plan: 63 YO male with break through seizure.  Currently he remains  lethargic and postictal.  He is bale to follow simple commands and recall he is in the hospital.  He is currently not showing any clinical seizure activity and EEG has been obtained and awaiting reading.   Recommend: 1) Keppra 1 gram BID 2) Seizure precautions 3) EEG --pending reading 4) MRI brain--he has history of lung cancer.    Patient seen and examined together with physician assistant and I concur with the assessment and plan.  Dorian Pod, MD

## 2014-10-08 NOTE — ED Notes (Signed)
Mittens applied to pts hands after pt pull out 2 ivs. Movement not restricted by mittens. Will continue to monitor and reassess hands and pt remains on monitor.

## 2014-10-08 NOTE — Progress Notes (Signed)
Group home director present and states that pt lives in a group home names [facility] that takes care of him and his four brothers. Pt had recently been dx with lung cancer and baseline is independent. Pt is currently alert but sometimes difficult to understand.

## 2014-10-08 NOTE — ED Notes (Signed)
Marco [name] [phone] (cell) [employer] and HCPOA.

## 2014-10-08 NOTE — ED Notes (Addendum)
EEG BEING DONE AT BEDSIDE

## 2014-10-08 NOTE — Procedures (Signed)
ELECTROENCEPHALOGRAM REPORT   Patient: Marco [name]       Room #: 7D42 EEG No. ID: [id number] Age: 64 y.o.        Sex: male Referring Physician: patel Report Date:  10/08/2014        Interpreting Physician: [name] Goodell  History: Marco [name] is an 64 y.o. male with a history of MR and seizure disorder presenting with seizure-like activity.    Medications:  Scheduled: . fluticasone  2 spray Each Nare Daily  . heparin  5,000 Units Subcutaneous 3 times per day  . levETIRAcetam  1,000 mg Intravenous Q12H    Conditions of Recording:  This is a 16 channel EEG carried out with the patient in the drowsy and asleep states.  Description:  Clinically the patient did not appear awake during the entire recording.  He was lying on his left side throughout the entire record as well.  The background activity is slow and poorly organized consisting of a mixture of low to moderate voltage theta and delta activity.  Faster frequencies are only noted on rare occasions.  Over the left hemisphere there are frequent sharp transients with phase reversal at F7.  Despite their frequency there is no change in the clinical activity of the patient.  No normal sleep activity is noted.   Hyperventilation and intermittent photic stimulation were not performed.  IMPRESSION: This is an abnormal electroencephalogram secondary to general background slowing.  This may be seen with a diffuse disturbance that is etiologically nonspecific, but may include the post-ictal state, among other possibilities.  There were also frequent sharp transients over the left hemisphere with phase reversal at [facility] consistent with the patient's history of seizures.      [name] Goodell, MD [facility] [phone] 10/08/2014, 6:17 PM

## 2014-10-08 NOTE — ED Provider Notes (Signed)
Care assumed from Dr. Ayesha Rumpf at change of shift.  Patient with known seizure disorder, had seizure tonight.  Patient was somnolent upon arrival after Versed.  Plan was to check urine and ambulate patient and discharged back to group home.  Patient has not return to his baseline per his caretaker.  Patient has not had any further seizures, but does have a left preferential gaze, has been incontinent of urine, which is different for him.  We'll contact neurology, give 0.5 of Ativan for subclinical seizures and may need to load on Keppra.  Spoke with Dr Nicole Kindred who agrees with keppra load, Ct scan with contrast.  4:32 AM Pt has had third seizure after ativan 0.5 mg and just as infusion as keppra started.  Additional ativan 0.5 given.  No further seizures.  CT head with/without unremarkable.  [facility]   Results for orders placed or performed during the hospital encounter of 10/07/14  Comprehensive metabolic panel  Result Value Ref Range   Sodium 140 135 - 145 mmol/L   Potassium 4.9 3.5 - 5.1 mmol/L   Chloride 109 96 - 112 mmol/L   CO2 23 19 - 32 mmol/L   Glucose, Bld 221 (H) 70 - 99 mg/dL   BUN 11 6 - 23 mg/dL   Creatinine, Ser 1.54 (H) 0.50 - 1.35 mg/dL   Calcium 8.9 8.4 - 10.5 mg/dL   Total Protein 6.6 6.0 - 8.3 g/dL   Albumin 3.8 3.5 - 5.2 g/dL   AST 20 0 - 37 U/L   ALT 9 0 - 53 U/L   Alkaline Phosphatase 71 39 - 117 U/L   Total Bilirubin 0.4 0.3 - 1.2 mg/dL   GFR calc non Af Amer 46 (L) >90 mL/min   GFR calc Af Amer 53 (L) >90 mL/min   Anion gap 8 5 - 15  CBC with Differential  Result Value Ref Range   WBC 11.1 (H) 4.0 - 10.5 K/uL   RBC 4.76 4.22 - 5.81 MIL/uL   Hemoglobin 13.9 13.0 - 17.0 g/dL   HCT 41.8 39.0 - 52.0 %   MCV 87.8 78.0 - 100.0 fL   MCH 29.2 26.0 - 34.0 pg   MCHC 33.3 30.0 - 36.0 g/dL   RDW 13.4 11.5 - 15.5 %   Platelets 164 150 - 400 K/uL   Neutrophils Relative % 91 (H) 43 - 77 %   Neutro Abs 10.0 (H) 1.7 - 7.7 K/uL   Lymphocytes Relative 5 (L)  12 - 46 %   Lymphs Abs 0.5 (L) 0.7 - 4.0 K/uL   Monocytes Relative 4 3 - 12 %   Monocytes Absolute 0.5 0.1 - 1.0 K/uL   Eosinophils Relative 0 0 - 5 %   Eosinophils Absolute 0.0 0.0 - 0.7 K/uL   Basophils Relative 0 0 - 1 %   Basophils Absolute 0.0 0.0 - 0.1 K/uL  Urinalysis, Routine w reflex microscopic  Result Value Ref Range   Color, Urine STRAW (A) YELLOW   APPearance CLEAR CLEAR   Specific Gravity, Urine 1.019 1.005 - 1.030   pH 5.0 5.0 - 8.0   Glucose, UA NEGATIVE NEGATIVE mg/dL   Hgb urine dipstick NEGATIVE NEGATIVE   Bilirubin Urine NEGATIVE NEGATIVE   Ketones, ur NEGATIVE NEGATIVE mg/dL   Protein, ur NEGATIVE NEGATIVE mg/dL   Urobilinogen, UA 1.0 0.0 - 1.0 mg/dL   Nitrite NEGATIVE NEGATIVE   Leukocytes, UA NEGATIVE NEGATIVE  I-stat Chem 8, ED  Result Value Ref Range  Sodium 142 135 - 145 mmol/L   Potassium 4.5 3.5 - 5.1 mmol/L   Chloride 102 96 - 112 mmol/L   BUN 14 6 - 23 mg/dL   Creatinine, Ser 1.30 0.50 - 1.35 mg/dL   Glucose, Bld 219 (H) 70 - 99 mg/dL   Calcium, Ion 1.19 1.13 - 1.30 mmol/L   TCO2 21 0 - 100 mmol/L   Hemoglobin 15.6 13.0 - 17.0 g/dL   HCT 46.0 39.0 - 52.0 %   Ct Head W Wo Contrast  10/08/2014   CLINICAL DATA:  Weakness seizure lasting 2 minutes, subsequent unresponsive and followup seizure. Unresponsive.  EXAM: CT HEAD WITHOUT AND WITH CONTRAST  TECHNIQUE: Contiguous axial images were obtained from the base of the skull through the vertex without and with intravenous contrast  CONTRAST:  64mL OMNIPAQUE IOHEXOL 300 MG/ML  SOLN  COMPARISON:  None available for comparison at time of study interpretation.  FINDINGS: The ventricles and sulci are normal for age. No intraparenchymal hemorrhage, mass effect nor midline shift. Patchy supratentorial white matter hypodensities are less than expected for patient's age and though non-specific suggest sequelae of chronic small vessel ischemic disease. No acute large vascular territory infarcts. RIGHT inferior basal  ganglia perivascular space. No abnormal parenchymal or extra-axial enhancement.  No abnormal extra-axial fluid collections. Basal cisterns are patent. Moderate calcific atherosclerosis of the carotid siphons.  No skull fracture. The included ocular globes and orbital contents are non-suspicious. The mastoid aircells and included paranasal sinuses are well-aerated.  IMPRESSION: No acute intracranial process ; normal CT of the head with and without contrast for age.   Electronically Signed   By: Elon Alas   On: 10/08/2014 03:45   Dg Chest Port 1 View  10/07/2014   CLINICAL DATA:  Acute onset of seizure.  Initial encounter.  EXAM: PORTABLE CHEST - 1 VIEW  COMPARISON:  None.  FINDINGS: The lungs are well-aerated. Left midlung airspace opacification is compatible with pneumonia. There is no evidence of pleural effusion or pneumothorax.  The cardiomediastinal silhouette is within normal limits. No acute osseous abnormalities are seen.  IMPRESSION: Left midlung pneumonia noted.   Electronically Signed   By: Garald Balding M.D.   On: 10/07/2014 22:59    CRITICAL CARE Performed by: Kalman Drape Total critical care time: 30 min Critical care time was exclusive of separately billable procedures and treating other patients. Critical care was necessary to treat or prevent imminent or life-threatening deterioration. Critical care was time spent personally by me on the following activities: development of treatment plan with patient and/or surrogate as well as nursing, discussions with consultants, evaluation of patient's response to treatment, examination of patient, obtaining history from patient or surrogate, ordering and performing treatments and interventions, ordering and review of laboratory studies, ordering and review of radiographic studies, pulse oximetry and re-evaluation of patient's condition.  Kalman Drape, MD 10/08/14 [phone]

## 2014-10-08 NOTE — H&P (Signed)
[facility] History and Physical  Patient: Marco [name]  MRN: [id number]  DOB: 27-Jun-1951  DOS: the patient was seen and examined on 10/08/2014 PCP: Leamon Arnt, MD  Chief Complaint: Seizure  HPI: Marco [name] is a 64 y.o. male with Past medical history of mental retardation, seizure disorder, GERD, squamous cell lung cancer on surveillance. The patient is presenting with complaints of seizure-like activity. The patient was at his home where one of the group home members found that the patient was not acting himself and was confused. After that the patient started having generalized tonic-clonic activity without any incontinence of bowel or bladder without any tongue bite. After that episode the patient was somnolent and was not responding to verbal stimuli and therefore EMS was called in. EMS brought the patient here and here at around 4:30 patient had another episode of seizure-like activity. Aberrantly patient had another seizure-like activity with EMS. Patient has been persistently post ictal since his first episode and has not returned to his baseline. No fever no chills no cough no chest pain no nausea no vomiting no diarrhea reported by group home member at bedside. Patient has been compliant with all his medication and there is no recent change in his medication. No history of drug or alcohol abuse reported. 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  Review of Systems: as mentioned in the history of present illness.  A Comprehensive review of the other systems is negative.  Past Medical History  Diagnosis Date  . Mental retardation    History reviewed. No pertinent past surgical history. Social History:  reports that he has quit smoking. He does not have any smokeless tobacco history on file. He reports that he does not drink alcohol or use  illicit drugs.  No Known Allergies  No family history on file.  Prior to Admission medications   Medication Sig Start Date End Date Taking? Authorizing Provider  bisacodyl (DULCOLAX) 5 MG EC tablet Take 5 mg by mouth daily as needed for moderate constipation.   Yes Historical Provider, MD  donepezil (ARICEPT) 10 MG tablet Take 10 mg by mouth at bedtime.   Yes Historical Provider, MD  fluticasone (FLONASE) 50 MCG/ACT nasal spray Place 2 sprays into both nostrils daily.   Yes Historical Provider, MD  levETIRAcetam (KEPPRA) 750 MG tablet Take 750 mg by mouth 2 (two) times daily.   Yes Historical Provider, MD  LORazepam (ATIVAN) 1 MG tablet Take 2 mg by mouth at bedtime.   Yes Historical Provider, MD  omeprazole (PRILOSEC) 20 MG capsule Take 20 mg by mouth 2 (two) times daily before a meal.   Yes Historical Provider, MD  risperiDONE (RISPERDAL) 3 MG tablet Take 3 mg by mouth at bedtime.   Yes Historical Provider, MD    Physical Exam: Filed Vitals:   10/08/14 0415 10/08/14 0515 10/08/14 0545 10/08/14 0615  BP: 121/66 114/65 118/69 121/66  Pulse: 104 99 95 96  Temp:      TempSrc:      Resp: 16 21 18 25   SpO2: 95% 95% 99% 100%    General: Alert, Awake and Oriented to Time, Place and Person. Appear in mild distress Eyes: PERRL ENT: Oral Mucosa clear moist. Neck: no JVD Cardiovascular: S1 and S2 Present, no Murmur, Peripheral Pulses Present Respiratory: Bilateral Air entry equal and Decreased, Clear to Auscultation, noCrackles,  no wheezes Abdomen: Bowel Sound present, Soft and no tender Skin: no Rash Extremities: no Pedal edema, no calf tenderness Neurologic: Drowsy but arousable does not follow command but spontaneously moves all extremities and withdraws to painful stimuli. Reflexes present bilaterally. Pupils are reactive bilaterally.  Labs on Admission:  CBC:  Recent Labs Lab 10/07/14 2039 10/07/14 2052  WBC 11.1*  --   NEUTROABS 10.0*  --   HGB 13.9 15.6  HCT 41.8 46.0   MCV 87.8  --   PLT 164  --     CMP     Component Value Date/Time   NA 142 10/07/2014 2052   K 4.5 10/07/2014 2052   CL 102 10/07/2014 2052   CO2 23 10/07/2014 2039   GLUCOSE 219* 10/07/2014 2052   BUN 14 10/07/2014 2052   CREATININE 1.30 10/07/2014 2052   CALCIUM 8.9 10/07/2014 2039   PROT 6.6 10/07/2014 2039   ALBUMIN 3.8 10/07/2014 2039   AST 20 10/07/2014 2039   ALT 9 10/07/2014 2039   ALKPHOS 71 10/07/2014 2039   BILITOT 0.4 10/07/2014 2039   GFRNONAA 46* 10/07/2014 2039   GFRAA 53* 10/07/2014 2039    No results for input(s): LIPASE, AMYLASE in the last 168 hours.  No results for input(s): CKTOTAL, CKMB, CKMBINDEX, TROPONINI in the last 168 hours. BNP (last 3 results) No results for input(s): BNP in the last 8760 hours.  ProBNP (last 3 results) No results for input(s): PROBNP in the last 8760 hours.   Radiological Exams on Admission: Ct Head W Wo Contrast  10/08/2014   CLINICAL DATA:  Weakness seizure lasting 2 minutes, subsequent unresponsive and followup seizure. Unresponsive.  EXAM: CT HEAD WITHOUT AND WITH CONTRAST  TECHNIQUE: Contiguous axial images were obtained from the base of the skull through the vertex without and with intravenous contrast  CONTRAST:  10mL OMNIPAQUE IOHEXOL 300 MG/ML  SOLN  COMPARISON:  None available for comparison at time of study interpretation.  FINDINGS: The ventricles and sulci are normal for age. No intraparenchymal hemorrhage, mass effect nor midline shift. Patchy supratentorial white matter hypodensities are less than expected for patient's age and though non-specific suggest sequelae of chronic small vessel ischemic disease. No acute large vascular territory infarcts. RIGHT inferior basal ganglia perivascular space. No abnormal parenchymal or extra-axial enhancement.  No abnormal extra-axial fluid collections. Basal cisterns are patent. Moderate calcific atherosclerosis of the carotid siphons.  No skull fracture. The included ocular  globes and orbital contents are non-suspicious. The mastoid aircells and included paranasal sinuses are well-aerated.  IMPRESSION: No acute intracranial process ; normal CT of the head with and without contrast for age.   Electronically Signed   By: Elon Alas   On: 10/08/2014 03:45   Dg Chest Port 1 View  10/07/2014   CLINICAL DATA:  Acute onset of seizure.  Initial encounter.  EXAM: PORTABLE CHEST - 1 VIEW  COMPARISON:  None.  FINDINGS: The lungs are well-aerated. Left midlung airspace opacification is compatible with pneumonia. There is no evidence of pleural effusion or pneumothorax.  The cardiomediastinal silhouette is within normal limits. No acute osseous abnormalities are seen.  IMPRESSION: Left midlung pneumonia noted.   Electronically Signed   By: Garald Balding M.D.   On: 10/07/2014 22:59    EKG: Independently reviewed. normal sinus rhythm, nonspecific ST and T waves changes.  Assessment/Plan Principal Problem:   Status epilepticus Active Problems:   Seizure disorder   GERD (gastroesophageal reflux disease)   Squamous cell lung cancer  Mental retardation   1. Status epilepticus versus prolonged postictal period. Patient has been discussed with neurology who recommended to load the patient with IV Keppra and increase his Keppra dose #50 mg twice a day to 1 g twice a day. I would also obtain an EEG. CT scan of the head is unremarkable. We will obtain further workup to identify possible causes of seizure. Patient will be  admitted in stepdown unit. Serial neuro checks as well as telemetry monitoring. At present patient is not hypoxic and is appearing to have ability to protect his airway. Continue close monitoring.  2. Mood disorder. At present holding his home medication in view of ongoing seizure-like activity with prolonged postictal period.  3. Squamous cell cancer. CT scan with contrast does not show any evidence of metastasis. Continue close  monitoring.  Advance goals of care discussion: Full code presumed    Consults: neurology for consult   DVT Prophylaxis: subcutaneous Heparin NutritiNothing by mouth Disposition: Admitted to inpatient in step-down unit.  Author: Berle Mull, MD [facility] Pager: [phone] 10/08/2014, 6:37 AM    If 7PM-7AM, please contact night-coverage [email or web address] Password [email or web address]

## 2014-10-08 NOTE — Progress Notes (Signed)
EEG completed at bedside.  Results pending.

## 2014-10-09 ENCOUNTER — Inpatient Hospital Stay (HOSPITAL_COMMUNITY): Payer: Medicare Other

## 2014-10-09 DIAGNOSIS — G40301 Generalized idiopathic epilepsy and epileptic syndromes, not intractable, with status epilepticus: Secondary | ICD-10-CM | POA: Diagnosis not present

## 2014-10-09 DIAGNOSIS — R410 Disorientation, unspecified: Secondary | ICD-10-CM

## 2014-10-09 DIAGNOSIS — F79 Unspecified intellectual disabilities: Secondary | ICD-10-CM | POA: Diagnosis not present

## 2014-10-09 DIAGNOSIS — K219 Gastro-esophageal reflux disease without esophagitis: Secondary | ICD-10-CM | POA: Diagnosis not present

## 2014-10-09 DIAGNOSIS — G40901 Epilepsy, unspecified, not intractable, with status epilepticus: Secondary | ICD-10-CM | POA: Diagnosis not present

## 2014-10-09 DIAGNOSIS — R4182 Altered mental status, unspecified: Secondary | ICD-10-CM | POA: Insufficient documentation

## 2014-10-09 LAB — GLUCOSE, CAPILLARY: GLUCOSE-CAPILLARY: 99 mg/dL (ref 70–99)

## 2014-10-09 LAB — LIPID PANEL
CHOLESTEROL: 125 mg/dL (ref 0–200)
HDL: 47 mg/dL (ref >39)
LDL Cholesterol: 64 mg/dL (ref 0–99)
Total CHOL/HDL Ratio: 2.7 RATIO
Triglycerides: 72 mg/dL (ref <150)
VLDL: 14 mg/dL (ref 0–40)

## 2014-10-09 LAB — PROLACTIN: PROLACTIN: 12.1 ng/mL (ref 4.0–15.2)

## 2014-10-09 MED ORDER — GADOBENATE DIMEGLUMINE 529 MG/ML IV SOLN
15.0000 mL | Freq: Once | INTRAVENOUS | Status: AC | PRN
  Administered 2014-10-09: 15 mL via INTRAVENOUS

## 2014-10-09 MED ORDER — LORAZEPAM 2 MG/ML IJ SOLN
INTRAMUSCULAR | Status: AC
  Filled 2014-10-09: qty 1

## 2014-10-09 MED ORDER — LORAZEPAM 2 MG/ML IJ SOLN
0.5000 mg | Freq: Once | INTRAMUSCULAR | Status: AC
  Administered 2014-10-09: 0.5 mg via INTRAVENOUS

## 2014-10-09 MED ORDER — LORAZEPAM 2 MG/ML IJ SOLN
2.0000 mg | Freq: Two times a day (BID) | INTRAMUSCULAR | Status: DC | PRN
  Administered 2014-10-09: 2 mg via INTRAVENOUS
  Filled 2014-10-09: qty 1

## 2014-10-09 MED ORDER — LORAZEPAM 2 MG/ML IJ SOLN
1.0000 mg | Freq: Once | INTRAMUSCULAR | Status: DC
  Administered 2014-10-09: 1 mg via INTRAVENOUS

## 2014-10-09 NOTE — Progress Notes (Signed)
Unable to obtain MRI with 0.5mg  of Ativan. Pt was not able to be still. Will make day shift nurse aware.

## 2014-10-09 NOTE — Consult Note (Signed)
Physical Medicine and Rehabilitation Consult Reason for Consult: Breakthrough seizures/history mental retardation Referring Physician: [facility]   HPI: Marco [name] is a 64 y.o. right handed male with history of mental retardation, seizure disorder maintained on Keppra 750 mg twice a day, squamous cell lung cancer. Patient is a resident of a family group home and has a personal care attendant 24 hours daily. He was independent prior to admission with supervision provided by staff and would clean his own room daily. Presented 10/08/2014 with witnessed seizure and altered mental status. MRI of the brain showed edema and low level restricted diffusion in the hypo-campus mesial temporal lobe on the left, most typical of a postictal change. EEG abnormal electroencephalogram secondary to general background slowing. There were also frequent sharp transients over the left hemisphere with phase reversal at [facility] consistent with patient's history of seizure. Carotid Dopplers with no ICA stenosis. Neurology consulted and Keppra was increased to 1000 mg twice a day. Subcutaneous heparin added for DVT prophylaxis. Physical and occupational therapy evaluation completed 10/09/2014 with recommendations of physical medicine rehabilitation consult   Review of Systems  Unable to perform ROS: mental acuity   Past Medical History  Diagnosis Date  . Mental retardation   . Seizure disorder   . Lung cancer    History reviewed. No pertinent past surgical history. Family History  Problem Relation Age of Onset  . Hypertension Mother   . Hypertension Father    Social History:  reports that he has quit smoking. He does not have any smokeless tobacco history on file. He reports that he does not drink alcohol or use illicit drugs. Allergies: No Known Allergies Medications Prior to Admission  Medication Sig Dispense Refill  . bisacodyl (DULCOLAX) 5 MG EC tablet Take 5 mg by mouth daily as needed for moderate  constipation.    Marland Kitchen donepezil (ARICEPT) 10 MG tablet Take 10 mg by mouth at bedtime.    . fluticasone (FLONASE) 50 MCG/ACT nasal spray Place 2 sprays into both nostrils daily.    Marland Kitchen levETIRAcetam (KEPPRA) 750 MG tablet Take 750 mg by mouth 2 (two) times daily.    Marland Kitchen LORazepam (ATIVAN) 1 MG tablet Take 2 mg by mouth at bedtime.    Marland Kitchen omeprazole (PRILOSEC) 20 MG capsule Take 20 mg by mouth 2 (two) times daily before a meal.    . risperiDONE (RISPERDAL) 3 MG tablet Take 3 mg by mouth at bedtime.      Home: Home Living Family/patient expects to be discharged to:: Group home (family group home) Living Arrangements: Other relatives, Other (Comment) (4 brothers with develpmental disabilities and caregiver) Available Help at Discharge: Personal care attendant, Available 24 hours/day, Other (Comment) Type of Home: House Home Access: Stairs to enter [employer] of Steps: 4 Entrance Stairs-Rails: Can reach both Home Layout: Multi-level Alternate Level Stairs-Number of Steps: 8 Home Equipment: None  Functional History: Prior Function Level of Independence: Independent, Needs assistance Comments: patient had supervision/ assist for higher cognitive tasks, but was able to mobilize and perform various aspects of self care independently Functional Status:  Mobility: Bed Mobility Overal bed mobility: Needs Assistance Bed Mobility: Supine to Sit, Sit to Supine Supine to sit: Min assist Sit to supine: Min assist General bed mobility comments: Min assist to elevate to upright, (increased assist and time with use of chuck pad to rotate hips to EOB secondary to cognition.) Transfers Overall transfer level: Needs assistance Equipment used: 2 person hand held assist Transfers: Sit to/from  Stand Sit to Stand: Mod assist, +2 physical assistance Ambulation/Gait Ambulation/Gait assistance: Mod assist, +2 physical assistance Ambulation Distance (Feet): 50 Feet Assistive device: 2 person hand held  assist Gait Pattern/deviations: Step-to pattern, Decreased stride length, Shuffle, Ataxic, Drifts right/left, Narrow base of support Gait velocity: decreased Gait velocity interpretation: Below normal speed for age/gender General Gait Details: patient significantly unsteady with gait, very different from baseline per caregiver.  Assist for stability patient staggering with poor coordination of gait, posterior support provided secondary to intermittent posterior lean, no evidence of ability to self correct at this time.     ADL: ADL Overall ADL's : Needs assistance/impaired Eating/Feeding: NPO (awaiting swallow assessment) Grooming: Wash/dry hands, Standing, Moderate assistance Grooming Details (indicate cue type and reason): physical and verbal cues for sequencing Upper Body Bathing: Total assistance, Sitting Lower Body Bathing: Total assistance, Sit to/from stand Upper Body Dressing : Maximal assistance, Sitting Lower Body Dressing: Total assistance, Sit to/from stand Toilet Transfer: +2 for physical assistance, Moderate assistance, Ambulation Toileting- Clothing Manipulation and Hygiene: Total assistance, Sit to/from stand Functional mobility during ADLs: +2 for physical assistance, Moderate assistance  Cognition: Cognition Overall Cognitive Status: Impaired/Different from baseline Orientation Level: Disoriented to place, Disoriented to time, Disoriented to situation Cognition Arousal/Alertness: Awake/alert Behavior During Therapy: Restless, Impulsive Overall Cognitive Status: Impaired/Different from baseline Area of Impairment: Orientation, Attention, Memory, Following commands, Awareness, Problem solving Orientation Level: Disoriented to, Place, Time, Situation Current Attention Level: Focused Memory: Decreased short-term memory Following Commands: Follows one step commands inconsistently Awareness:  (pre-intellectual) Problem Solving: Slow processing, Decreased initiation,  Difficulty sequencing, Requires verbal cues, Requires tactile cues General Comments: per caregiver, patient usually very quick witted and oriented, able to follow commands and carry out daily tasks with some levels of independence vs supervision  Blood pressure 129/76, pulse 63, temperature 98.4 F (36.9 C), temperature source Oral, resp. rate 18, height 5\' 10"  (1.778 m), weight 69.5 kg (153 lb 3.5 oz), SpO2 93 %. Physical Exam  Constitutional:  64 year old male appearing older than  reported age  HENT:  Poor dentition  Eyes:  Pupils reactive to light  Neck: Normal range of motion. Neck supple. No thyromegaly present.  Cardiovascular: Normal rate and regular rhythm.   Respiratory: Effort normal and breath sounds normal. No respiratory distress.  GI: Soft. Bowel sounds are normal. He exhibits no distension.  Neurological:  Patient is alert. Bilateral mittens in place for safety. Speech was very thick and was able to state his name but did not know his personal care attendant at bedside. He did not follow commands. Moves all 4's but inconsistently  Skin: Skin is warm and dry.    Results for orders placed or performed during the hospital encounter of 10/07/14 (from the past 24 hour(s))  MRSA PCR Screening     Status: None   Collection Time: 10/08/14  5:04 PM  Result Value Ref Range   MRSA by PCR NEGATIVE NEGATIVE  Lipid panel     Status: None   Collection Time: 10/09/14  3:13 AM  Result Value Ref Range   Cholesterol 125 0 - 200 mg/dL   Triglycerides 72 <150 mg/dL   HDL 47 >39 mg/dL   Total CHOL/HDL Ratio 2.7 RATIO   VLDL 14 0 - 40 mg/dL   LDL Cholesterol 64 0 - 99 mg/dL   Ct Head W Wo Contrast  10/08/2014   CLINICAL DATA:  Weakness seizure lasting 2 minutes, subsequent unresponsive and followup seizure. Unresponsive.  EXAM: CT HEAD WITHOUT AND WITH  CONTRAST  TECHNIQUE: Contiguous axial images were obtained from the base of the skull through the vertex without and with intravenous  contrast  CONTRAST:  93mL OMNIPAQUE IOHEXOL 300 MG/ML  SOLN  COMPARISON:  None available for comparison at time of study interpretation.  FINDINGS: The ventricles and sulci are normal for age. No intraparenchymal hemorrhage, mass effect nor midline shift. Patchy supratentorial white matter hypodensities are less than expected for patient's age and though non-specific suggest sequelae of chronic small vessel ischemic disease. No acute large vascular territory infarcts. RIGHT inferior basal ganglia perivascular space. No abnormal parenchymal or extra-axial enhancement.  No abnormal extra-axial fluid collections. Basal cisterns are patent. Moderate calcific atherosclerosis of the carotid siphons.  No skull fracture. The included ocular globes and orbital contents are non-suspicious. The mastoid aircells and included paranasal sinuses are well-aerated.  IMPRESSION: No acute intracranial process ; normal CT of the head with and without contrast for age.   Electronically Signed   By: Elon Alas   On: 10/08/2014 03:45   Mr Brain W Wo Contrast  10/09/2014   CLINICAL DATA:  Mental status changes.  History of seizure disorder.  EXAM: MRI HEAD WITHOUT AND WITH CONTRAST  TECHNIQUE: Multiplanar, multiecho pulse sequences of the brain and surrounding structures were obtained without and with intravenous contrast.  CONTRAST:  84mL MULTIHANCE GADOBENATE DIMEGLUMINE 529 MG/ML IV SOLN  COMPARISON:  Head CT 10/08/2014  FINDINGS: Fast scanning was performed because of patient motion. The examination continues to be significantly motion degraded.  There is low level restricted diffusion and edema affecting the hippocampus/ mesial temporal lobe on the left consistent with postictal edema. The remainder the brain appears normal without evidence of old or acute infarction, mass lesion, hemorrhage, hydrocephalus or extra-axial collection. After contrast administration, no abnormal enhancement occurs. No pituitary mass. No  inflammatory sinus disease. No skull or skullbase lesion seen.  IMPRESSION: Edema and low level restricted diffusion in the hippocampus/mesial temporal lobe on the left, most typical of postictal change. The patient could be restudied in the future distant from any seizure event to see if this area has a normalized appearance or any definable permanent underlying abnormality.   Electronically Signed   By: Nelson Chimes M.D.   On: 10/09/2014 14:22   Dg Chest Port 1 View  10/07/2014   CLINICAL DATA:  Acute onset of seizure.  Initial encounter.  EXAM: PORTABLE CHEST - 1 VIEW  COMPARISON:  None.  FINDINGS: The lungs are well-aerated. Left midlung airspace opacification is compatible with pneumonia. There is no evidence of pleural effusion or pneumothorax.  The cardiomediastinal silhouette is within normal limits. No acute osseous abnormalities are seen.  IMPRESSION: Left midlung pneumonia noted.   Electronically Signed   By: Garald Balding M.D.   On: 10/07/2014 22:59    Assessment/Plan: Diagnosis: seizure idsorder 1. Does the need for close, 24 hr/day medical supervision in concert with the patient's rehab needs make it unreasonable for this patient to be served in a less intensive setting? No 2. Co-Morbidities requiring supervision/potential complications: GERD, MR 3. Due to bladder management, bowel management, safety, skin/wound care, disease management, medication administration, pain management and patient education, does the patient require 24 hr/day rehab nursing? No 4. Does the patient require coordinated care of a physician, rehab nurse, PT (1-2 hrs/day, 5 days/week) and OT (1-2 hrs/day, 5 days/week) to address physical and functional deficits in the context of the above medical diagnosis(es)? No Addressing deficits in the following areas: balance, endurance, locomotion,  strength and transferring 5. Can the patient actively participate in an intensive therapy program of at least 3 hrs of therapy per  day at least 5 days per week? Potentially 6. The potential for patient to make measurable gains while on inpatient rehab is fair 7. Anticipated functional outcomes upon discharge from inpatient rehab are n/a  with PT, n/a with OT, n/a with SLP. 8. Estimated rehab length of stay to reach the above functional goals is: n/a 9. Does the patient have adequate social supports and living environment to accommodate these discharge functional goals? Yes 10. Anticipated D/C setting: Home 11. Anticipated post D/C treatments: [place] therapy 12. Overall Rehab/Functional Prognosis: excellent  RECOMMENDATIONS: This patient's condition is appropriate for continued rehabilitative care in the following setting: [facility] Therapy Patient has agreed to participate in recommended program. Yes Note that insurance prior authorization may be required for reimbursement for recommended care.  Comment: Home with supervision. Lacks medical necessity for CIR. Should continue to improve quickly towards baseline.  Meredith Staggers, MD, [facility] 10/12/2014     10/09/2014

## 2014-10-09 NOTE — Progress Notes (Signed)
Assisted pt to procedure table with MRI staff, pt noted to be more relaxed, eyes closed, [id number]. Not flailing arms any longer.

## 2014-10-09 NOTE — Progress Notes (Signed)
VASCULAR LAB PRELIMINARY  PRELIMINARY  PRELIMINARY  PRELIMINARY  Carotid duplex  completed.    Preliminary report:  Bilateral:  1-39% ICA stenosis.  Vertebral artery flow is antegrade.      Marco [name], RVT 10/09/2014, 11:18 AM

## 2014-10-09 NOTE — Evaluation (Signed)
Occupational Therapy Evaluation Patient Details Name: Marco [name] MRN: [id number] DOB: Nov 07, 1950 Today's Date: 10/09/2014    History of Present Illness  64 y.o. male with Past medical history of mental retardation, seizure disorder, GERD, squamous cell lung cancer on surveillance. Patient presents s/p seizure, neuro work up pending.   Clinical Impression   Pt was able to walk and perform self care independently prior to admission.  Pt was quite particular about being clean. He was supervised for housekeeping and could make his own coffee with modifications provided by his caregivers. He attends [employer] during the week and enjoys socializing and drinking coffee while there. Pt presents with restlessness and impulsivity, impaired balance, AMS, decreased ability to sequence and dependence in all ADL. He required 2 person assist for OOB mobility.  Recommending CIR.  Will follow acutely.    Follow Up Recommendations  CIR;Supervision/Assistance - 24 hour    Equipment Recommendations  Other (comment) (TBD)    Recommendations for Other Services Rehab consult     Precautions / Restrictions Precautions Precautions: Fall      Mobility Bed Mobility Overal bed mobility: Needs Assistance Bed Mobility: Supine to Sit;Sit to Supine     Supine to sit: Min assist Sit to supine: Min assist   General bed mobility comments: Min assist to elevate to upright, (increased assist and time with use of chuck pad to rotate hips to EOB secondary to cognition.)  Transfers Overall transfer level: Needs assistance Equipment used: 2 person hand held assist Transfers: Sit to/from Stand Sit to Stand: Mod assist;+2 physical assistance              Balance Overall balance assessment: Needs assistance Sitting-balance support: Feet supported Sitting balance-Leahy Scale: Fair     Standing balance support: Bilateral upper extremity supported;During functional activity Standing balance-Leahy Scale:  Poor Standing balance comment: patient unable to self stabilize in static standing, posterior                            ADL Overall ADL's : Needs assistance/impaired Eating/Feeding: NPO (awaiting swallow assessment)   Grooming: Wash/dry hands;Standing;Moderate assistance Grooming Details (indicate cue type and reason): physical and verbal cues for sequencing Upper Body Bathing: Total assistance;Sitting   Lower Body Bathing: Total assistance;Sit to/from stand   Upper Body Dressing : Maximal assistance;Sitting   Lower Body Dressing: Total assistance;Sit to/from stand   Toilet Transfer: +2 for physical assistance;Moderate assistance;Ambulation   Toileting- Clothing Manipulation and Hygiene: Total assistance;Sit to/from stand       Functional mobility during ADLs: +2 for physical assistance;Moderate assistance       Vision                     Perception     Praxis      Pertinent Vitals/Pain Pain Assessment: No/denies pain     Hand Dominance Right   Extremity/Trunk Assessment Upper Extremity Assessment Upper Extremity Assessment: Overall WFL for tasks assessed   Lower Extremity Assessment Lower Extremity Assessment: Defer to PT evaluation RLE Coordination: decreased fine motor;decreased gross motor LLE Coordination: decreased fine motor;decreased gross motor       Communication Communication Communication: Expressive difficulties (slurred speech)   Cognition Arousal/Alertness: Awake/alert Behavior During Therapy: Restless;Impulsive Overall Cognitive Status: Impaired/Different from baseline Area of Impairment: Orientation;Attention;Memory;Following commands;Awareness;Problem solving Orientation Level: Disoriented to;Place;Time;Situation Current Attention Level: Focused Memory: Decreased short-term memory Following Commands: Follows one step commands inconsistently   Awareness:  (pre-intellectual) Problem Solving:  Slow processing;Decreased  initiation;Difficulty sequencing;Requires verbal cues;Requires tactile cues General Comments: per caregiver, patient usually very quick witted and oriented, able to follow commands and carry out daily tasks with some levels of independence vs supervision   General Comments       Exercises       Shoulder Instructions      Home Living Family/patient expects to be discharged to:: Group home (family group home) Living Arrangements: Other relatives;Other (Comment) (4 brothers with develpmental disabilities and caregiver) Available Help at Discharge: Personal care attendant;Available 24 hours/day;Other (Comment) Type of Home: House Home Access: Stairs to enter [employer] of Steps: 4 Entrance Stairs-Rails: Can reach both Home Layout: Multi-level Alternate Level Stairs-Number of Steps: 8             Home Equipment: None          Prior Functioning/Environment Level of Independence: Independent;Needs assistance        Comments: patient had supervision/ assist for higher cognitive tasks, but was able to mobilize and perform various aspects of self care independently    OT Diagnosis: Generalized weakness;Cognitive deficits;Altered mental status   OT Problem List: Decreased strength;Decreased activity tolerance;Impaired balance (sitting and/or standing);Decreased coordination;Decreased cognition;Decreased safety awareness;Decreased knowledge of use of DME or AE   OT Treatment/Interventions: Self-care/ADL training;Therapeutic activities;DME and/or AE instruction;Patient/family education;Balance training;Cognitive remediation/compensation    OT Goals(Current goals can be found in the care plan section) Acute Rehab OT Goals Patient Stated Goal: to get back to baseline OT Goal Formulation:  (with caregiver) Time For Goal Achievement: 10/23/14 Potential to Achieve Goals: Good ADL Goals Pt Will Perform Eating: sitting;with supervision (when cleared for POs) Pt Will  Perform Grooming: standing;with min assist Pt Will Perform Upper Body Bathing: sitting;with min assist Pt Will Perform Lower Body Bathing: sit to/from stand;with min assist Pt Will Perform Upper Body Dressing: sitting;with min assist Pt Will Perform Lower Body Dressing: sit to/from stand;with min assist Pt Will Transfer to Toilet: ambulating;regular height toilet;with min assist Pt Will Perform Toileting - Clothing Manipulation and hygiene: sit to/from stand;with min assist Pt Will Perform Tub/Shower Transfer: Tub transfer;ambulating;with min assist (determine need for shower seat)  OT Frequency: Min 2X/week   Barriers to D/C:            Co-evaluation PT/OT/SLP Co-Evaluation/Treatment: Yes Reason for Co-Treatment: For patient/therapist safety;Necessary to address cognition/behavior during functional activity   OT goals addressed during session: ADL's and self-care      End of Session Equipment Utilized During Treatment: Gait belt Nurse Communication: Mobility status  Activity Tolerance: Patient limited by fatigue Patient left: in bed;with call bell/phone within reach;with family/visitor present   Time: 8875-7972 OT Time Calculation (min): 28 min Charges:  OT General Charges $OT Visit: 1 Procedure OT Evaluation $Initial OT Evaluation Tier I: 1 Procedure G-Codes:    Malka So 10/09/2014, 10:19 AM  [phone]

## 2014-10-09 NOTE — Evaluation (Signed)
Speech Language Pathology Evaluation Patient Details Name: Marco [name] MRN: [id number] DOB: Dec 31, 1950 Today's Date: 10/09/2014 Time: 3007-6226 SLP Time Calculation (min) (ACUTE ONLY): 14 min  Problem List:  Patient Active Problem List   Diagnosis Date Noted  . Altered mental status   . Status epilepticus 10/08/2014  . Seizure disorder 10/08/2014  . GERD (gastroesophageal reflux disease) 10/08/2014  . Squamous cell lung cancer 10/08/2014  . Mental retardation 10/08/2014   Past Medical History:  Past Medical History  Diagnosis Date  . Mental retardation   . Seizure disorder   . Lung cancer    Past Surgical History: History reviewed. No pertinent past surgical history. HPI:  64 y.o. male with Past medical history of mental retardation, seizure disorder, GERD, squamous cell lung cancer on surveillance. Patient presents s/p seizure, MRI negative for acute changes.   Assessment / Plan / Recommendation Clinical Impression  Pt has a severe dysarthria that impedes intelligibility at the word level. Per caregivers, intelligibility is only mildly impaired at baseline. Decreased clarity of speech limits overall linguistic evaluation, however pt does appear to have some confusion and disorientation. He does follow simple one-step commands well. Decreased alertness currently impacts higher levels of cognition including focused and sustained attention, however ativan is likely a contributing factor. Pt will benefit from skilled SLP services to maximize functional cognition and communication.    SLP Assessment  Patient needs continued Speech Lanaguage Pathology Services    Follow Up Recommendations  Inpatient Rehab;24 hour supervision/assistance    Frequency and Duration min 2x/week  2 weeks   Pertinent Vitals/Pain Pain Assessment: No/denies pain   SLP Goals  Patient/Family Stated Goal: wants coffee Potential to Achieve Goals (ACUTE ONLY): Good Potential Considerations (ACUTE ONLY):  Previous level of function  SLP Evaluation Prior Functioning  Cognitive/Linguistic Baseline: Baseline deficits Baseline deficit details: pt with h/o ID who lives in group home - per caregivers, at baseline he is at a supervision level for basic household tasks, and they provide cooking, medication management, and money management Type of Home: Group Home Available Help at Discharge: Personal care attendant;Available 24 hours/day;Other (Comment)   Cognition  Overall Cognitive Status: Impaired/Different from baseline Arousal/Alertness: Suspect due to medications (received ativan for MRI) Orientation Level: Oriented to person;Oriented to place;Disoriented to situation Attention: Focused;Sustained Focused Attention: Impaired Focused Attention Impairment: Verbal basic;Functional basic Sustained Attention: Impaired Sustained Attention Impairment: Verbal basic;Functional basic Awareness: Impaired Awareness Impairment: Emergent impairment Problem Solving: Impaired Problem Solving Impairment: Functional basic    Comprehension  Auditory Comprehension Overall Auditory Comprehension: Other (comment) (appears appropriate for basic instructions/familiar question) Visual Recognition/Discrimination Discrimination: Not tested Reading Comprehension Reading Status: Not tested    Expression Expression Primary Mode of Expression: Verbal Verbal Expression Overall Verbal Expression: Other (comment) (difficult to assess due to decreased intelligibility) Written Expression Written Expression: Not tested   Oral / Motor Motor Speech Overall Motor Speech: Impaired Respiration: Within functional limits Phonation: Normal Resonance: Within functional limits Articulation: Impaired Level of Impairment: Word Intelligibility: Intelligibility reduced Word: 25-49% accurate Interfering Components: Premorbid status;Inadequate dentition (per caregivers, some decreased intelligibility at baseline)   GO      Germain Osgood, M.A. CCC-SLP [phone]  Germain Osgood 10/09/2014, 4:39 PM

## 2014-10-09 NOTE — Progress Notes (Signed)
Patient attempting to get OOB with family at bedside. Patient difficult to redirect for safety. MD notified for sitter and to  Review home meds.

## 2014-10-09 NOTE — Progress Notes (Signed)
[facility] PROGRESS NOTE  Marco [name] YQM:[id number] DOB: 1951-02-16 DOA: 10/07/2014 PCP: Leamon Arnt, MD  Assessment/Plan: 1. Status epilepticus 1. Now on IV keppra 2. EEG with findings of seizure activity 3. No seizures reported overnight 4. MRI brain done with findings suggestive of post-ictal state 5. Neurology is following 6. PT/OT following. Recs for CIR 2. Mood d/o 1. Difficult to assess given post-ictal state 3. Hx Squamous cell lung ca 1. Discussed with family. Pt is s/p treatments which "shrunk" lung ca. Per family, lesion reportedly increased in size slightly and pt is to follow up shortly for outpatient imaging for interval change 4. GERD 1. Stable 5. Mental Retardation 1. Stable at present 6. DVT prophylaxis 1. Heparin subQ  Code Status: Full Family Communication: Pt in room, family at bedside (indicate person spoken with, relationship, and if by phone, the number) Disposition Plan: pending CIR eval   Consultants:  neurology  Procedures:    Antibiotics:   (indicate start date, and stop date if known)  HPI/Subjective: No acute events noted overnight. Pt unable to tolerate MRI brain yesterday, successfully underwent study this afternoon  Objective: Filed Vitals:   10/08/14 2126 10/09/14 0000 10/09/14 0332 10/09/14 0854  BP: 130/65 127/67 123/69 129/76  Pulse: 77 71 63   Temp: 99.3 F (37.4 C) 99.6 F (37.6 C) 98.1 F (36.7 C) 98.4 F (36.9 C)  TempSrc: Oral Oral Oral Oral  Resp: 17 14 18    Height:      Weight:      SpO2: 98% 99% 93%    No intake or output data in the 24 hours ending 10/09/14 1444 Filed Weights   10/08/14 1700  Weight: 69.5 kg (153 lb 3.5 oz)    Exam:   General:  Awake, in nad, confused  Cardiovascular: regular, s1, s2  Respiratory: normal resp effort, no wheezing  Abdomen: soft,nondistended  Musculoskeletal: perfused, no clubbing   Data Reviewed: Basic Metabolic Panel:  Recent Labs Lab  10/07/14 2039 10/07/14 2052 10/08/14 0655  NA 140 142 141  K 4.9 4.5 3.9  CL 109 102 104  CO2 23  --  29  GLUCOSE 221* 219* 127*  BUN 11 14 12   CREATININE 1.54* 1.30 1.20  CALCIUM 8.9  --  8.8   Liver Function Tests:  Recent Labs Lab 10/07/14 2039 10/08/14 0655  AST 20 27  ALT 9 21  ALKPHOS 71 70  BILITOT 0.4 0.5  PROT 6.6 7.0  ALBUMIN 3.8 3.9   No results for input(s): LIPASE, AMYLASE in the last 168 hours.  Recent Labs Lab 10/08/14 0655  AMMONIA 41*   CBC:  Recent Labs Lab 10/07/14 2039 10/07/14 2052 10/08/14 0655  WBC 11.1*  --  15.3*  NEUTROABS 10.0*  --  14.1*  HGB 13.9 15.6 14.4  HCT 41.8 46.0 43.3  MCV 87.8  --  87.8  PLT 164  --  173   Cardiac Enzymes:  Recent Labs Lab 10/08/14 0655  CKTOTAL 449*   BNP (last 3 results) No results for input(s): BNP in the last 8760 hours.  ProBNP (last 3 results) No results for input(s): PROBNP in the last 8760 hours.  CBG: No results for input(s): GLUCAP in the last 168 hours.  Recent Results (from the past 240 hour(s))  MRSA PCR Screening     Status: None   Collection Time: 10/08/14  5:04 PM  Result Value Ref Range Status   MRSA by PCR NEGATIVE NEGATIVE Final    Comment:  The GeneXpert MRSA Assay (FDA approved for NASAL specimens only), is one component of a comprehensive MRSA colonization surveillance program. It is not intended to diagnose MRSA infection nor to guide or monitor treatment for MRSA infections.      Studies: Ct Head W Wo Contrast  10/08/2014   CLINICAL DATA:  Weakness seizure lasting 2 minutes, subsequent unresponsive and followup seizure. Unresponsive.  EXAM: CT HEAD WITHOUT AND WITH CONTRAST  TECHNIQUE: Contiguous axial images were obtained from the base of the skull through the vertex without and with intravenous contrast  CONTRAST:  53mL OMNIPAQUE IOHEXOL 300 MG/ML  SOLN  COMPARISON:  None available for comparison at time of study interpretation.  FINDINGS: The  ventricles and sulci are normal for age. No intraparenchymal hemorrhage, mass effect nor midline shift. Patchy supratentorial white matter hypodensities are less than expected for patient's age and though non-specific suggest sequelae of chronic small vessel ischemic disease. No acute large vascular territory infarcts. RIGHT inferior basal ganglia perivascular space. No abnormal parenchymal or extra-axial enhancement.  No abnormal extra-axial fluid collections. Basal cisterns are patent. Moderate calcific atherosclerosis of the carotid siphons.  No skull fracture. The included ocular globes and orbital contents are non-suspicious. The mastoid aircells and included paranasal sinuses are well-aerated.  IMPRESSION: No acute intracranial process ; normal CT of the head with and without contrast for age.   Electronically Signed   By: Elon Alas   On: 10/08/2014 03:45   Mr Brain W Wo Contrast  10/09/2014   CLINICAL DATA:  Mental status changes.  History of seizure disorder.  EXAM: MRI HEAD WITHOUT AND WITH CONTRAST  TECHNIQUE: Multiplanar, multiecho pulse sequences of the brain and surrounding structures were obtained without and with intravenous contrast.  CONTRAST:  86mL MULTIHANCE GADOBENATE DIMEGLUMINE 529 MG/ML IV SOLN  COMPARISON:  Head CT 10/08/2014  FINDINGS: Fast scanning was performed because of patient motion. The examination continues to be significantly motion degraded.  There is low level restricted diffusion and edema affecting the hippocampus/ mesial temporal lobe on the left consistent with postictal edema. The remainder the brain appears normal without evidence of old or acute infarction, mass lesion, hemorrhage, hydrocephalus or extra-axial collection. After contrast administration, no abnormal enhancement occurs. No pituitary mass. No inflammatory sinus disease. No skull or skullbase lesion seen.  IMPRESSION: Edema and low level restricted diffusion in the hippocampus/mesial temporal lobe on  the left, most typical of postictal change. The patient could be restudied in the future distant from any seizure event to see if this area has a normalized appearance or any definable permanent underlying abnormality.   Electronically Signed   By: Nelson Chimes M.D.   On: 10/09/2014 14:22   Dg Chest Port 1 View  10/07/2014   CLINICAL DATA:  Acute onset of seizure.  Initial encounter.  EXAM: PORTABLE CHEST - 1 VIEW  COMPARISON:  None.  FINDINGS: The lungs are well-aerated. Left midlung airspace opacification is compatible with pneumonia. There is no evidence of pleural effusion or pneumothorax.  The cardiomediastinal silhouette is within normal limits. No acute osseous abnormalities are seen.  IMPRESSION: Left midlung pneumonia noted.   Electronically Signed   By: Garald Balding M.D.   On: 10/07/2014 22:59    Scheduled Meds: . fluticasone  2 spray Each Nare Daily  . heparin  5,000 Units Subcutaneous 3 times per day  . levETIRAcetam  1,000 mg Intravenous Q12H  . LORazepam      . LORazepam  Continuous Infusions:   Principal Problem:   Status epilepticus Active Problems:   Seizure disorder   GERD (gastroesophageal reflux disease)   Squamous cell lung cancer   Mental retardation   [facility], [facility] Pager [phone]. If 7PM-7AM, please contact night-coverage at [email or web address], password [facility] 10/09/2014, 2:44 PM  LOS: 2 days

## 2014-10-09 NOTE — Progress Notes (Signed)
NEURO HOSPITALIST PROGRESS NOTE   SUBJECTIVE:                                                                                                                        No further seizures reported. Group home director present at the bedside and expressing concern because he can not recognize her which is unusual. EEG showed no electrographic seizures but diffuse continuous slowing that in this particular scenario is most likely post ictal.There were also frequent sharp transients over the left hemisphere with phase reversal at F7. MRI attempted but unable to perform even with ativan. Serologies significant for leukocytosis>15,000. Ammonia slightly elevated. UA negative Remains afebrile. On keppra.  OBJECTIVE:                                                                                                                           Vital signs in last 24 hours: Temp:  [98.1 F (36.7 C)-100.6 F (38.1 C)] 98.4 F (36.9 C) (02/05 0854) Pulse Rate:  [63-93] 63 (02/05 0332) Resp:  [14-27] 18 (02/05 0332) BP: (108-130)/(63-76) 129/76 mmHg (02/05 0854) SpO2:  [93 %-99 %] 93 % (02/05 0332) Weight:  [69.5 kg (153 lb 3.5 oz)] 69.5 kg (153 lb 3.5 oz) (02/04 1700)  Intake/Output from previous day:   Intake/Output this shift:   Nutritional status: Diet NPO time specified  Past Medical History  Diagnosis Date  . Mental retardation   . Seizure disorder   . Lung cancer     Physical exam: pleasant male in no apparent distress. Head: normocephalic. Neck: supple, no bruits, no JVD. Cardiac: no murmurs. Lungs: clear. Abdomen: soft, no tender, no mass. Extremities: no edema. Skin: no rash  Neurologic Exam:  Mental Status: Alert and awake but disoriented to place-year-month. Speech dysarthric without evidence of aphasia. Able to follow simple commands but has difficulty with complex. Cranial Nerves: II: Discs flat bilaterally; blinks to threat  bilaterally, pupils equal, round, reactive to light and accommodation III,IV, VI: ptosis not present, extra-ocular motions intact bilaterally V,VII: smile symmetric, facial light touch sensation normal bilaterally VIII: hearing normal bilaterally IX,X: gag reflex present XI: bilateral shoulder shrug XII: midline tongue extension Motor: Moving all extremities antigravity.  Sensory: Pinprick and light touch intact  throughout, bilaterally Deep Tendon Reflexes: 2+ and symmetric throughout Plantars: Right: downgoingLeft: downgoing Cerebellar: normal finger-to-nose, Gait: not tested due to safety reasons  Lab Results: Lab Results  Component Value Date/Time   CHOL 125 10/09/2014 03:13 AM   Lipid Panel  Recent Labs  10/09/14 0313  CHOL 125  TRIG 72  HDL 47  CHOLHDL 2.7  VLDL 14  LDLCALC 64    Studies/Results: Ct Head W Wo Contrast  10/08/2014   CLINICAL DATA:  Weakness seizure lasting 2 minutes, subsequent unresponsive and followup seizure. Unresponsive.  EXAM: CT HEAD WITHOUT AND WITH CONTRAST  TECHNIQUE: Contiguous axial images were obtained from the base of the skull through the vertex without and with intravenous contrast  CONTRAST:  104mL OMNIPAQUE IOHEXOL 300 MG/ML  SOLN  COMPARISON:  None available for comparison at time of study interpretation.  FINDINGS: The ventricles and sulci are normal for age. No intraparenchymal hemorrhage, mass effect nor midline shift. Patchy supratentorial white matter hypodensities are less than expected for patient's age and though non-specific suggest sequelae of chronic small vessel ischemic disease. No acute large vascular territory infarcts. RIGHT inferior basal ganglia perivascular space. No abnormal parenchymal or extra-axial enhancement.  No abnormal extra-axial fluid collections. Basal cisterns are patent. Moderate calcific atherosclerosis of the carotid siphons.  No skull fracture. The included ocular globes and  orbital contents are non-suspicious. The mastoid aircells and included paranasal sinuses are well-aerated.  IMPRESSION: No acute intracranial process ; normal CT of the head with and without contrast for age.   Electronically Signed   By: Elon Alas   On: 10/08/2014 03:45   Dg Chest Port 1 View  10/07/2014   CLINICAL DATA:  Acute onset of seizure.  Initial encounter.  EXAM: PORTABLE CHEST - 1 VIEW  COMPARISON:  None.  FINDINGS: The lungs are well-aerated. Left midlung airspace opacification is compatible with pneumonia. There is no evidence of pleural effusion or pneumothorax.  The cardiomediastinal silhouette is within normal limits. No acute osseous abnormalities are seen.  IMPRESSION: Left midlung pneumonia noted.   Electronically Signed   By: Garald Balding M.D.   On: 10/07/2014 22:59    MEDICATIONS                                                                                                                        Scheduled: . fluticasone  2 spray Each Nare Daily  . heparin  5,000 Units Subcutaneous 3 times per day  . levETIRAcetam  1,000 mg Intravenous Q12H  . LORazepam        ASSESSMENT/PLAN:  64 YO male with break through seizure. He remains lethargic and confused. No focal findings on exam. EEG without electrographic seizures. ? Prolonged confusion after a cluster of seizures or new structural cerebral insult. Patient has a history of lung cancer and thus re attempting MRI with a higher dose ativan is necessary. Will continue current AED regimen.  Dorian Pod, MD [facility] [phone]  10/09/2014, 10:06 AM

## 2014-10-09 NOTE — Clinical Social Work Psychosocial (Signed)
Clinical Social Work Department BRIEF PSYCHOSOCIAL ASSESSMENT 10/09/2014  Patient:  Marco [name], Marco [name]     Account Number:  [id number]     Admit date:  10/07/2014  Clinical Social Worker:  Domenica Reamer, CLINICAL SOCIAL WORKER  Date/Time:  10/09/2014 12:04 PM  Referred by:  Physician  Date Referred:  10/09/2014 Referred for  Other - See comment   Other Referral:   from group home   Interview type:  Other - See comment Other interview type:   Group home director Lenis Noon    PSYCHOSOCIAL DATA Living Status:  FACILITY Admitted from facility:   Level of care:  Group Home Primary support name:  Lenis Noon Primary support relationship to patient:  NONE Degree of support available:   High level of support from group home coordinator- patient also lives at group home with 3 of his brothers who also have developmental disabilities    CURRENT CONCERNS Current Concerns  Post-Acute Placement   Other Concerns:    SOCIAL WORK ASSESSMENT / PLAN CSW spoke with Kenya who is the coordinator at [facility]- she reports that pt has lived with them for years and has 3 other siblings who also live in the facility.  Raphael Gibney states that patient can come back to the facility when he is able to DC   Assessment/plan status:  Psychosocial Support/Ongoing Assessment of Needs Other assessment/ plan:   none   Information/referral to community resources:   none    PATIENT'S/FAMILY'S RESPONSE TO PLAN OF CARE: Patients caregiver is agreeable to plan for patinet to return to group home at time of DC and is hopeful that patient will make a full recovery soon.       Domenica Reamer, [place] Social Worker [phone]

## 2014-10-09 NOTE — Evaluation (Signed)
Physical Therapy Evaluation Patient Details Name: Marco [name] MRN: [id number] DOB: [birth year] Today's Date: 10/09/2014   History of Present Illness   64 y.o. male with Past medical history of mental retardation, seizure disorder, GERD, squamous cell lung cancer on surveillance. Patient presents s/p seizure, neuro work up pending.  Clinical Impression  Patient demonstrates deficits in functional mobility as indicated below. Will need continued skilled PT to address deficits and maximize function. Will see as indicated and progress as tolerated. Spoke extensively with caregiver re: patient's baseline. At this time, patient is significantly deviated from baseline, Patient at baseline is fairly independent with mobility and some self tasks, but does require supervision and assist from caregivers for higher level cognitive tasks (managing meds etc).  Feel patient will benefit from comprehensive inpatient rehabilitation to get back to baseline function. Recommend CIR consult.    Follow Up Recommendations CIR;Supervision/Assistance - 24 hour    Equipment Recommendations  Other (comment) (tbd)    Recommendations for Other Services Rehab consult     Precautions / Restrictions Precautions Precautions: Fall      Mobility  Bed Mobility Overal bed mobility: Needs Assistance Bed Mobility: Supine to Sit;Sit to Supine     Supine to sit: Min assist Sit to supine: Min assist   General bed mobility comments: Min assist to elevate to upright, (increased assist and time with use of chuck pad to rotate hips to EOB secondary to cognition.)  Transfers Overall transfer level: Needs assistance Equipment used: 2 person hand held assist Transfers: Sit to/from Stand Sit to Stand: Mod assist;+2 physical assistance            Ambulation/Gait Ambulation/Gait assistance: Mod assist;+2 physical assistance Ambulation Distance (Feet): 50 Feet Assistive device: 2 person hand held assist Gait  Pattern/deviations: Step-to pattern;Decreased stride length;Shuffle;Ataxic;Drifts right/left;Narrow base of support Gait velocity: decreased Gait velocity interpretation: Below normal speed for age/gender General Gait Details: patient significantly unsteady with gait, very different from baseline per caregiver.  Assist for stability patient staggering with poor coordination of gait, posterior support provided secondary to intermittent posterior lean, no evidence of ability to self correct at this time.   Stairs            Wheelchair Mobility    Modified Rankin (Stroke Patients Only)       Balance Overall balance assessment: Needs assistance Sitting-balance support: Feet supported Sitting balance-Leahy Scale: Fair     Standing balance support: Bilateral upper extremity supported;During functional activity Standing balance-Leahy Scale: Poor Standing balance comment: patient unable to self stabilize in static standing, posterior                             Pertinent Vitals/Pain Pain Assessment: No/denies pain    Home Living Family/patient expects to be discharged to:: Group home (Per caregiver, group home is patient's family home) Living Arrangements: Other relatives;Other (Comment) (caregivers) Available Help at Discharge: Personal care attendant;Available 24 hours/day;Other (Comment) Type of Home: House Home Access: Stairs to enter Entrance Stairs-Rails: Can reach both Entrance Stairs-Number of Steps: 4 Home Layout: Multi-level Home Equipment: None      Prior Function Level of Independence: Independent;Needs assistance         Comments: patient had supervision/ assist for higher cognitive tasks, but was able to mobilize and perform various aspects of self care independently     Hand Dominance   Dominant Hand: Right    Extremity/Trunk Assessment  Lower Extremity Assessment: RLE deficits/detail;LLE deficits/detail;Difficult to  assess due to impaired cognition         Communication   Communication: Expressive difficulties (slurred speech )  Cognition Arousal/Alertness: Awake/alert Behavior During Therapy: Restless;Impulsive Overall Cognitive Status: Impaired/Different from baseline Area of Impairment: Orientation;Attention;Memory;Following commands;Awareness;Problem solving Orientation Level: Disoriented to;Place;Time;Situation Current Attention Level: Focused Memory: Decreased short-term memory Following Commands: Follows one step commands inconsistently   Awareness:  (pre-intellectual) Problem Solving: Slow processing;Decreased initiation;Difficulty sequencing;Requires verbal cues;Requires tactile cues General Comments: per caregiver, patient usually very quick witted and oriented, able to follow commands and carry out daily tasks with some levels of independence vs supervision    General Comments      Exercises        Assessment/Plan    PT Assessment Patient needs continued PT services  PT Diagnosis Difficulty walking;Abnormality of gait;Altered mental status   PT Problem List Decreased activity tolerance;Decreased balance;Decreased mobility;Decreased coordination;Decreased cognition;Decreased safety awareness  PT Treatment Interventions DME instruction;Gait training;Stair training;Functional mobility training;Therapeutic activities;Therapeutic exercise;Balance training;Cognitive remediation;Patient/family education   PT Goals (Current goals can be found in the Care Plan section) Acute Rehab PT Goals Patient Stated Goal: to get back to baseline PT Goal Formulation: Patient unable to participate in goal setting Time For Goal Achievement: 10/23/14 Potential to Achieve Goals: Good    Frequency Min 3X/week   Barriers to discharge        Co-evaluation               End of Session Equipment Utilized During Treatment: Gait belt Activity Tolerance: Patient tolerated treatment well;Patient  limited by fatigue Patient left: in bed;with call bell/phone within reach;with bed alarm set;with family/visitor present Nurse Communication: Mobility status         Time: 9381-8299 PT Time Calculation (min) (ACUTE ONLY): 28 min   Charges:   PT Evaluation $Initial PT Evaluation Tier I: 1 Procedure     PT G CodesDuncan Dull 10/20/2014, 10:05 AM Alben Deeds, PT DPT  [phone]

## 2014-10-09 NOTE — Progress Notes (Signed)
Called from MRI to come given second dose of ordered Ativan 2mg  IV for pt who is unable to lie still for ordered MRI.  Pt next to desk, bilateral mittens on, waving arms in air, Respirations regular rate 18 on room air, eyes open but not able to state name, arm band checked.

## 2014-10-09 NOTE — Progress Notes (Signed)
Pt currently in MRI for second attempt after given extra dose of Ativan. Still not effective. NP made aware to see if wanting to give anything else to calm pt down while he is in MRI.  Tavion Senkbeil M. Dalbert Batman, RN, BSN 10/09/2014 5:18 AM

## 2014-10-09 NOTE — Progress Notes (Addendum)
SLP Cancellation Note  Patient Details Name: Marco [name] MRN: [id number] DOB: May 17, 1951   Cancelled treatment:       Reason Eval/Treat Not Completed: Patient at procedure or test/unavailable. Pt currently having testing done in room and then per RN is being taken to MRI. Will return later to attempt swallowing and cognitive-linguistic evaluations. Per discussion with caregiver present, she believes that patient is "far from" his baseline for cognition and communication.  ADDENDUM: SLP returned at ~13:30 and patient has not yet returned to room. Per chart, pt required second dose of ativan in order to complete MRI - concerned re: pt's level of alertness for participation in swallow evaluation this afternoon after receiving sedating medication. SLP discussed with RN and provided pager number. RN to page SLP if pt returns from testing and is alert this afternoon.   Germain Osgood, M.A. CCC-SLP [phone]  Germain Osgood 10/09/2014, 11:12 AM

## 2014-10-09 NOTE — Evaluation (Signed)
Clinical/Bedside Swallow Evaluation Patient Details  Name: Marco [name] MRN: [id number] Date of Birth: 25-Jan-1951  Today's Date: 10/09/2014 Time: SLP Start Time (ACUTE ONLY): 8299 SLP Stop Time (ACUTE ONLY): 1551 SLP Time Calculation (min) (ACUTE ONLY): 16 min  Past Medical History:  Past Medical History  Diagnosis Date  . Mental retardation   . Seizure disorder   . Lung cancer    Past Surgical History: History reviewed. No pertinent past surgical history. HPI:  64 y.o. male with Past medical history of mental retardation, seizure disorder, GERD, squamous cell lung cancer on surveillance. Patient presents s/p seizure, MRI negative for acute changes.   Assessment / Plan / Recommendation Clinical Impression  Pt has an oral, primarily cognitively-based dysphagia with decreased bolus awareness. SLP provided Mod-Max cues to initiate oral transit with purees due to holding, with mild anterior loss noted with larger bites. No overt s/s of aspiration were observed. Suspect that swallow function at this time is impacted largely by decreased level of alertness from ativan administered for MRI as well as AMS in post-ictal state, as caregivers present report a regular diet and thin liquids at baseline. Recommend Dys 1 diet and thin liquids with SLP f/u for advancement as mentation improves.    Aspiration Risk  Moderate    Diet Recommendation Dysphagia 1 (Puree);Thin liquid   Liquid Administration via: Cup Medication Administration: Crushed with puree Supervision: Staff to assist with self feeding;Full supervision/cueing for compensatory strategies Compensations: Slow rate;Small sips/bites (check for oral holding) Postural Changes and/or Swallow Maneuvers: Seated upright 90 degrees    Other  Recommendations Oral Care Recommendations: Oral care BID   Follow Up Recommendations  Inpatient Rehab;24 hour supervision/assistance    Frequency and Duration min 2x/week  2 weeks   Pertinent  Vitals/Pain n/a    SLP Swallow Goals     Swallow Study Prior Functional Status       General Date of Onset: 10/08/14 HPI: 64 y.o. male with Past medical history of mental retardation, seizure disorder, GERD, squamous cell lung cancer on surveillance. Patient presents s/p seizure, MRI negative for acute changes. Type of Study: Bedside swallow evaluation Previous Swallow Assessment: none in chart Diet Prior to this Study: NPO Temperature Spikes Noted: Yes (low grade) Respiratory Status: Room air History of Recent Intubation: No Behavior/Cognition: Cooperative;Requires cueing;Other (comment);Decreased sustained attention (sleepy) Oral Cavity - Dentition: Missing dentition (pt has ~4 teeth) Self-Feeding Abilities: Needs assist Patient Positioning: Upright in bed Baseline Vocal Quality: Clear    Oral/Motor/Sensory Function     Ice Chips Ice chips: Not tested   Thin Liquid Thin Liquid: Impaired Presentation: Cup;Self Fed Oral Phase Functional Implications: Oral holding    Nectar Thick Nectar Thick Liquid: Not tested   Honey Thick Honey Thick Liquid: Not tested   Puree Puree: Impaired Presentation: Spoon Oral Phase Impairments: Poor awareness of bolus;Reduced labial seal;Reduced lingual movement/coordination Oral Phase Functional Implications: Right anterior spillage;Left anterior spillage;Oral holding Pharyngeal Phase Impairments: Multiple swallows   Solid   GO    Solid: Not tested       Germain Osgood, M.A. CCC-SLP [phone]  Germain Osgood 10/09/2014,4:22 PM

## 2014-10-09 NOTE — Progress Notes (Signed)
Rehab Admissions Coordinator Note:  Patient was screened by Cleatrice Burke for appropriateness for an Inpatient Acute Rehab Consult per PT and OT recommendations.   At this time, we are recommending Inpatient Rehab consult. Discussed with PT. Pt is from family home where they have a caregiver in the home to care for family members who have cognition issues. I will contact Attending MD for consult.   Cleatrice Burke 10/09/2014, 10:28 AM  I can be reached at [phone].

## 2014-10-10 DIAGNOSIS — G40909 Epilepsy, unspecified, not intractable, without status epilepticus: Secondary | ICD-10-CM | POA: Diagnosis not present

## 2014-10-10 DIAGNOSIS — C3490 Malignant neoplasm of unspecified part of unspecified bronchus or lung: Secondary | ICD-10-CM | POA: Diagnosis not present

## 2014-10-10 DIAGNOSIS — G40901 Epilepsy, unspecified, not intractable, with status epilepticus: Secondary | ICD-10-CM | POA: Diagnosis not present

## 2014-10-10 DIAGNOSIS — G40301 Generalized idiopathic epilepsy and epileptic syndromes, not intractable, with status epilepticus: Secondary | ICD-10-CM | POA: Diagnosis not present

## 2014-10-10 DIAGNOSIS — K219 Gastro-esophageal reflux disease without esophagitis: Secondary | ICD-10-CM | POA: Diagnosis not present

## 2014-10-10 LAB — HEMOGLOBIN A1C
Hgb A1c MFr Bld: 5.6 % (ref 4.8–5.6)
Mean Plasma Glucose: 114 mg/dL

## 2014-10-10 MED ORDER — LORAZEPAM 2 MG/ML IJ SOLN
1.0000 mg | Freq: Four times a day (QID) | INTRAMUSCULAR | Status: DC | PRN
Start: 2014-10-10 — End: 2014-10-13
  Administered 2014-10-10 – 2014-10-11 (×4): 1 mg via INTRAVENOUS
  Filled 2014-10-10 (×3): qty 1

## 2014-10-10 MED ORDER — LORAZEPAM 2 MG/ML IJ SOLN
0.5000 mg | Freq: Once | INTRAMUSCULAR | Status: AC
  Administered 2014-10-10: 0.5 mg via INTRAVENOUS
  Filled 2014-10-10: qty 1

## 2014-10-10 MED ORDER — LORAZEPAM 1 MG PO TABS
2.0000 mg | ORAL_TABLET | Freq: Every day | ORAL | Status: DC
  Administered 2014-10-10 – 2014-10-12 (×3): 2 mg via ORAL
  Filled 2014-10-10 (×3): qty 2

## 2014-10-10 MED ORDER — LABETALOL HCL 5 MG/ML IV SOLN
5.0000 mg | INTRAVENOUS | Status: DC | PRN
  Administered 2014-10-10 – 2014-10-11 (×4): 5 mg via INTRAVENOUS
  Filled 2014-10-10 (×7): qty 4

## 2014-10-10 MED ORDER — RISPERIDONE 3 MG PO TABS
3.0000 mg | ORAL_TABLET | Freq: Every day | ORAL | Status: DC
  Administered 2014-10-10 – 2014-10-12 (×3): 3 mg via ORAL
  Filled 2014-10-10 (×5): qty 1

## 2014-10-10 MED ORDER — WHITE PETROLATUM GEL
Status: AC
  Administered 2014-10-10: 22:00:00
  Filled 2014-10-10: qty 1

## 2014-10-10 NOTE — Progress Notes (Signed)
Patient had a period of increased HR from 140s-170s. MD notified and PRN order for labetalol ordered. Will continue to monitor.

## 2014-10-10 NOTE — Progress Notes (Signed)
[facility] PROGRESS NOTE  Adriane Guglielmo WVP:[id number] DOB: [birth year] DOA: 10/07/2014 PCP: Leamon Arnt, MD  Assessment/Plan: 1. Status epilepticus 1. Remains on IV keppra at 1gm q12hrs 2. EEG with findings of seizure activity 3. No seizures reported thus far 4. MRI brain done with findings of edema and low level restricted diffusion in the hippocampus/mesial temporal lobe that are c/w post-ictal state 5. Neurology is following 6. PT/OT following. Recs for CIR 7. Family reports intermittent bouts of confusion. Pt seemed at baseline when I examined this AM. Per Neurology, plan to monitor for now 2. Mood d/o 1. Seems stable 2. Pt anxious about wanting to go home 3. Hx Squamous cell lung ca 1. Had earlier discussed with family. Pt is s/p treatments which "shrunk" lung ca. Per family, lesion reportedly increased in size slightly and pt is to follow up shortly for outpatient imaging for interval change 4. GERD 1. Stable 5. Mental Retardation 1. Stable at present 6. DVT prophylaxis 1. Heparin subQ  Code Status: Full Family Communication: Pt in room, family at bedside Disposition Plan: pending CIR eval   Consultants:  neurology  Procedures:    Antibiotics:   (indicate start date, and stop date if known)  HPI/Subjective: More at baseline. Pt eager to go home. Per family, pt has intermittent bouts of confusion mixed with baseline orientation  Objective: Filed Vitals:   10/09/14 2340 10/10/14 0415 10/10/14 0719 10/10/14 1237  BP:  142/91 136/84 129/90  Pulse: 157 120 99 105  Temp:  98.3 F (36.8 C) 97.5 F (36.4 C) 97.8 F (36.6 C)  TempSrc:  Oral Oral Oral  Resp:  21 22 18   Height:      Weight:      SpO2:  99% 95% 98%    Intake/Output Summary (Last 24 hours) at 10/10/14 1845 Last data filed at 10/10/14 1726  Gross per 24 hour  Intake    620 ml  Output    525 ml  Net     95 ml   Filed Weights   10/08/14 1700  Weight: 69.5 kg (153 lb 3.5 oz)     Exam:   General:  Awake, in nad, conversant  Cardiovascular: regular, s1, s2  Respiratory: normal resp effort, no wheezing or crackles  Abdomen: soft,nondistended  Musculoskeletal: perfused, no clubbing   Data Reviewed: Basic Metabolic Panel:  Recent Labs Lab 10/07/14 2039 10/07/14 2052 10/08/14 0655  NA 140 142 141  K 4.9 4.5 3.9  CL 109 102 104  CO2 23  --  29  GLUCOSE 221* 219* 127*  BUN 11 14 12   CREATININE 1.54* 1.30 1.20  CALCIUM 8.9  --  8.8   Liver Function Tests:  Recent Labs Lab 10/07/14 2039 10/08/14 0655  AST 20 27  ALT 9 21  ALKPHOS 71 70  BILITOT 0.4 0.5  PROT 6.6 7.0  ALBUMIN 3.8 3.9   No results for input(s): LIPASE, AMYLASE in the last 168 hours.  Recent Labs Lab 10/08/14 0655  AMMONIA 41*   CBC:  Recent Labs Lab 10/07/14 2039 10/07/14 2052 10/08/14 0655  WBC 11.1*  --  15.3*  NEUTROABS 10.0*  --  14.1*  HGB 13.9 15.6 14.4  HCT 41.8 46.0 43.3  MCV 87.8  --  87.8  PLT 164  --  173   Cardiac Enzymes:  Recent Labs Lab 10/08/14 0655  CKTOTAL 449*   BNP (last 3 results) No results for input(s): BNP in the last 8760 hours.  ProBNP (last 3  results) No results for input(s): PROBNP in the last 8760 hours.  CBG:  Recent Labs Lab 10-28-2014 1459  GLUCAP 99    Recent Results (from the past 240 hour(s))  MRSA PCR Screening     Status: None   Collection Time: 10/08/14  5:04 PM  Result Value Ref Range Status   MRSA by PCR NEGATIVE NEGATIVE Final    Comment:        The GeneXpert MRSA Assay (FDA approved for NASAL specimens only), is one component of a comprehensive MRSA colonization surveillance program. It is not intended to diagnose MRSA infection nor to guide or monitor treatment for MRSA infections.      Studies: Mr Kizzie Fantasia Contrast  2014-10-28   CLINICAL DATA:  Mental status changes.  History of seizure disorder.  EXAM: MRI HEAD WITHOUT AND WITH CONTRAST  TECHNIQUE: Multiplanar, multiecho pulse  sequences of the brain and surrounding structures were obtained without and with intravenous contrast.  CONTRAST:  85mL MULTIHANCE GADOBENATE DIMEGLUMINE 529 MG/ML IV SOLN  COMPARISON:  Head CT 10/08/2014  FINDINGS: Fast scanning was performed because of patient motion. The examination continues to be significantly motion degraded.  There is low level restricted diffusion and edema affecting the hippocampus/ mesial temporal lobe on the left consistent with postictal edema. The remainder the brain appears normal without evidence of old or acute infarction, mass lesion, hemorrhage, hydrocephalus or extra-axial collection. After contrast administration, no abnormal enhancement occurs. No pituitary mass. No inflammatory sinus disease. No skull or skullbase lesion seen.  IMPRESSION: Edema and low level restricted diffusion in the hippocampus/mesial temporal lobe on the left, most typical of postictal change. The patient could be restudied in the future distant from any seizure event to see if this area has a normalized appearance or any definable permanent underlying abnormality.   Electronically Signed   By: Nelson Chimes M.D.   On: 2014/10/28 14:22    Scheduled Meds: . fluticasone  2 spray Each Nare Daily  . heparin  5,000 Units Subcutaneous 3 times per day  . levETIRAcetam  1,000 mg Intravenous Q12H  . LORazepam  2 mg Oral QHS  . risperiDONE  3 mg Oral QHS   Continuous Infusions:   Principal Problem:   Status epilepticus Active Problems:   Seizure disorder   GERD (gastroesophageal reflux disease)   Squamous cell lung cancer   Mental retardation   Altered mental status   CHIU, STEPHEN K  [facility] Pager [phone]. If 7PM-7AM, please contact night-coverage at [email or web address], password [facility] 10/10/2014, 6:45 PM  LOS: 3 days

## 2014-10-10 NOTE — Progress Notes (Signed)
NEURO HOSPITALIST PROGRESS NOTE   SUBJECTIVE:                                                                                                                        He is calm now, but his nurse report that he gets upset and agitated when he can not get things his way or when he needs something. No further seizures. MRI brain reviewed by myself showed no acute stroke but high signal, edema and low level restricted diffusion in the hippocampus/mesial temporal lobe on the left, most typical of postictal change.  Remains afebrile. No new labs available for review today. On keppra. OBJECTIVE:                                                                                                                           Vital signs in last 24 hours: Temp:  [97.5 F (36.4 C)-98.5 F (36.9 C)] 97.5 F (36.4 C) (02/06 0719) Pulse Rate:  [57-157] 99 (02/06 0719) Resp:  [19-22] 22 (02/06 0719) BP: (132-149)/(80-98) 136/84 mmHg (02/06 0719) SpO2:  [78 %-99 %] 95 % (02/06 0719)  Intake/Output from previous day: 02/05 0701 - 02/06 0700 In: 360 [P.O.:260; IV Piggyback:100] Out: 626 [Urine:626] Intake/Output this shift:   Nutritional status: DIET - DYS 1  Past Medical History  Diagnosis Date  . Mental retardation   . Seizure disorder   . Lung cancer   Physical exam: pleasant male in no apparent distress. Head: normocephalic. Neck: supple, no bruits, no JVD. Cardiac: no murmurs. Lungs: clear. Abdomen: soft, no tender, no mass. Extremities: no edema. Skin: no rash Neurologic Exam:  Mental Status: Alert and awake but disoriented to place-year-month. Speech dysarthric without evidence of aphasia. Able to follow simple commands but has difficulty with complex. Cranial Nerves: II: Discs flat bilaterally; blinks to threat bilaterally, pupils equal, round, reactive to light and accommodation III,IV, VI: ptosis not present, extra-ocular motions intact  bilaterally V,VII: smile symmetric, facial light touch sensation normal bilaterally VIII: hearing normal bilaterally IX,X: gag reflex present XI: bilateral shoulder shrug XII: midline tongue extension Motor: Moving all extremities antigravity.  Sensory: Pinprick and light touch intact throughout, bilaterally Deep Tendon Reflexes: 2+ and symmetric throughout Plantars: Right: downgoingLeft: downgoing Cerebellar:  normal finger-to-nose, Gait: not tested due to safety reasons   Lab Results: Lab Results  Component Value Date/Time   CHOL 125 10/09/2014 03:13 AM   Lipid Panel  Recent Labs  10/09/14 0313  CHOL 125  TRIG 72  HDL 47  CHOLHDL 2.7  VLDL 14  LDLCALC 64    Studies/Results: Mr Jeri Cos TJ Contrast  10/09/2014   CLINICAL DATA:  Mental status changes.  History of seizure disorder.  EXAM: MRI HEAD WITHOUT AND WITH CONTRAST  TECHNIQUE: Multiplanar, multiecho pulse sequences of the brain and surrounding structures were obtained without and with intravenous contrast.  CONTRAST:  71mL MULTIHANCE GADOBENATE DIMEGLUMINE 529 MG/ML IV SOLN  COMPARISON:  Head CT 10/08/2014  FINDINGS: Fast scanning was performed because of patient motion. The examination continues to be significantly motion degraded.  There is low level restricted diffusion and edema affecting the hippocampus/ mesial temporal lobe on the left consistent with postictal edema. The remainder the brain appears normal without evidence of old or acute infarction, mass lesion, hemorrhage, hydrocephalus or extra-axial collection. After contrast administration, no abnormal enhancement occurs. No pituitary mass. No inflammatory sinus disease. No skull or skullbase lesion seen.  IMPRESSION: Edema and low level restricted diffusion in the hippocampus/mesial temporal lobe on the left, most typical of postictal change. The patient could be restudied in the future distant from any seizure event to see if  this area has a normalized appearance or any definable permanent underlying abnormality.   Electronically Signed   By: Nelson Chimes M.D.   On: 10/09/2014 14:22    MEDICATIONS                                                                                                                        Scheduled: . fluticasone  2 spray Each Nare Daily  . heparin  5,000 Units Subcutaneous 3 times per day  . levETIRAcetam  1,000 mg Intravenous Q12H    ASSESSMENT/PLAN:                                                                                                            64 YO male with MR admitted with break through seizure.  No focal findings on exam. EEG without electrographic seizures. MRI findings more compatible with post ictal changes. Will continue current AED regimen. Will follow up.  Dorian Pod, MD [facility] [phone]  10/10/2014, 10:20 AM

## 2014-10-10 NOTE — Progress Notes (Signed)
Speech Language Pathology Treatment: Dysphagia  Patient Details Name: Marco [name] MRN: [id number] DOB: October 27, 1950 Today's Date: 10/10/2014 Time: 4158-3094 SLP Time Calculation (min) (ACUTE ONLY): 13 min  Assessment / Plan / Recommendation Clinical Impression  Purpose of SlP visit is to assess po tolerance.  Sitter in room and reports pt "just ate a good lunch" without difficulties/significant coughing.  SLP provided pt with chopped peaches, applesauce, moist cracker and water.  Poor mastication abilities noted with pt orally holding bolus and requiring cues and applesauce to aid oral transit/swallow. Pt is impulsive and consumes large sequential liquid boluses resulting in immediate coughing.  Small single boluses tolerated better.    Recommend continue dys1/thin diet with strict precautions.  SLP to follow up for readiness for dietary advancement.     HPI HPI: 64 y.o. male with Past medical history of mental retardation, seizure disorder, GERD, squamous cell lung cancer on surveillance. Patient presents s/p seizure, MRI negative for acute changes.   Pertinent Vitals Pain Assessment: No/denies pain  SLP Plan  Continue with current plan of care    Recommendations Diet recommendations: Dysphagia 1 (puree);Thin liquid Liquids provided via: Cup;No straw Medication Administration: Crushed with puree Supervision: Staff to assist with self feeding;Full supervision/cueing for compensatory strategies Compensations: Slow rate;Small sips/bites Postural Changes and/or Swallow Maneuvers: Seated upright 90 degrees;Upright 30-60 min after meal              Oral Care Recommendations: Oral care BID Follow up Recommendations: Inpatient Rehab;24 hour supervision/assistance Plan: Continue with current plan of care    [place], [facility] SLP [phone]

## 2014-10-10 NOTE — Progress Notes (Signed)
Report called to 5w

## 2014-10-11 DIAGNOSIS — G40909 Epilepsy, unspecified, not intractable, without status epilepticus: Secondary | ICD-10-CM | POA: Diagnosis not present

## 2014-10-11 DIAGNOSIS — G40301 Generalized idiopathic epilepsy and epileptic syndromes, not intractable, with status epilepticus: Secondary | ICD-10-CM | POA: Diagnosis not present

## 2014-10-11 DIAGNOSIS — F79 Unspecified intellectual disabilities: Secondary | ICD-10-CM | POA: Diagnosis not present

## 2014-10-11 DIAGNOSIS — K219 Gastro-esophageal reflux disease without esophagitis: Secondary | ICD-10-CM | POA: Diagnosis not present

## 2014-10-11 MED ORDER — HALOPERIDOL LACTATE 5 MG/ML IJ SOLN
5.0000 mg | Freq: Four times a day (QID) | INTRAMUSCULAR | Status: DC | PRN
  Administered 2014-10-11: 5 mg via INTRAVENOUS
  Filled 2014-10-11: qty 1

## 2014-10-11 MED ORDER — HALOPERIDOL LACTATE 5 MG/ML IJ SOLN
5.0000 mg | Freq: Once | INTRAMUSCULAR | Status: AC
  Administered 2014-10-11: 5 mg via INTRAVENOUS
  Filled 2014-10-11: qty 1

## 2014-10-11 NOTE — Progress Notes (Signed)
[facility] PROGRESS NOTE  Marco [name] GUR:[id number] DOB: 09-19-1950 DOA: 10/07/2014 PCP: Leamon Arnt, MD  Assessment/Plan: 1. Status epilepticus 1. Remains on IV keppra at 1gm q12hrs 2. EEG with findings of seizure activity 3. No seizures seen thus far 4. MRI brain done with findings of edema and low level restricted diffusion in the hippocampus/mesial temporal lobe that are c/w post-ictal state 5. Neurology is following 6. PT/OT following. Recs for CIR 7. Family reports intermittent bouts of confusion. Seemed at baseline yesterday AM when seen. When seen today, was more sedate s/p haldol 2. Mood d/o 1. Seems stable 3. Hx Squamous cell lung ca 1. Had discussed with family earlier. Pt is s/p treatments which "shrunk" lung ca. Per family, lesion reportedly increased in size slightly and pt is to follow up shortly for outpatient imaging for interval change 4. GERD 1. Stable 5. Mental Retardation 1. Pt becomes periodically agitated and wanting to get out of bed, requiring chemical sedation 6. DVT prophylaxis 1. Heparin subQ  Code Status: Full Family Communication: Pt in room Disposition Plan: pending CIR eval pending   Consultants:  neurology  Procedures:    Antibiotics:   (indicate start date, and stop date if known)  HPI/Subjective: More agitated this AM, wanting to get out of bed  Objective: Filed Vitals:   10/11/14 0655 10/11/14 0727 10/11/14 1340 10/11/14 1535  BP: 146/98   128/90  Pulse: 133 109 86 117  Temp: 99.2 F (37.3 C)   98.4 F (36.9 C)  TempSrc: Oral   Oral  Resp: 22   18  Height:      Weight:      SpO2:    99%    Intake/Output Summary (Last 24 hours) at 10/11/14 1544 Last data filed at 10/11/14 1000  Gross per 24 hour  Intake    780 ml  Output      0 ml  Net    780 ml   Filed Weights   10/08/14 1700  Weight: 69.5 kg (153 lb 3.5 oz)    Exam:   General:  Awake, in nad, conversant  Cardiovascular: regular, s1,  s2  Respiratory: normal resp effort, no wheezing or crackles  Abdomen: soft,nondistended  Musculoskeletal: perfused, no clubbing   Data Reviewed: Basic Metabolic Panel:  Recent Labs Lab 10/07/14 2039 10/07/14 2052 10/08/14 0655  NA 140 142 141  K 4.9 4.5 3.9  CL 109 102 104  CO2 23  --  29  GLUCOSE 221* 219* 127*  BUN 11 14 12   CREATININE 1.54* 1.30 1.20  CALCIUM 8.9  --  8.8   Liver Function Tests:  Recent Labs Lab 10/07/14 2039 10/08/14 0655  AST 20 27  ALT 9 21  ALKPHOS 71 70  BILITOT 0.4 0.5  PROT 6.6 7.0  ALBUMIN 3.8 3.9   No results for input(s): LIPASE, AMYLASE in the last 168 hours.  Recent Labs Lab 10/08/14 0655  AMMONIA 41*   CBC:  Recent Labs Lab 10/07/14 2039 10/07/14 2052 10/08/14 0655  WBC 11.1*  --  15.3*  NEUTROABS 10.0*  --  14.1*  HGB 13.9 15.6 14.4  HCT 41.8 46.0 43.3  MCV 87.8  --  87.8  PLT 164  --  173   Cardiac Enzymes:  Recent Labs Lab 10/08/14 0655  CKTOTAL 449*   BNP (last 3 results) No results for input(s): BNP in the last 8760 hours.  ProBNP (last 3 results) No results for input(s): PROBNP in the last 8760 hours.  CBG:  Recent Labs Lab 10/09/14 1459  GLUCAP 99    Recent Results (from the past 240 hour(s))  MRSA PCR Screening     Status: None   Collection Time: 10/08/14  5:04 PM  Result Value Ref Range Status   MRSA by PCR NEGATIVE NEGATIVE Final    Comment:        The GeneXpert MRSA Assay (FDA approved for NASAL specimens only), is one component of a comprehensive MRSA colonization surveillance program. It is not intended to diagnose MRSA infection nor to guide or monitor treatment for MRSA infections.      Studies: No results found.  Scheduled Meds: . fluticasone  2 spray Each Nare Daily  . heparin  5,000 Units Subcutaneous 3 times per day  . levETIRAcetam  1,000 mg Intravenous Q12H  . LORazepam  2 mg Oral QHS  . risperiDONE  3 mg Oral QHS   Continuous Infusions:   Principal  Problem:   Status epilepticus Active Problems:   Seizure disorder   GERD (gastroesophageal reflux disease)   Squamous cell lung cancer   Mental retardation   Altered mental status   Marco [name] K  [facility] Pager [phone]. If 7PM-7AM, please contact night-coverage at [email or web address], password [facility] 10/11/2014, 3:44 PM  LOS: 4 days

## 2014-10-11 NOTE — Progress Notes (Signed)
Utilization review completed.  

## 2014-10-12 ENCOUNTER — Inpatient Hospital Stay (HOSPITAL_COMMUNITY): Payer: Medicare Other

## 2014-10-12 DIAGNOSIS — G40301 Generalized idiopathic epilepsy and epileptic syndromes, not intractable, with status epilepticus: Secondary | ICD-10-CM | POA: Diagnosis not present

## 2014-10-12 DIAGNOSIS — G40901 Epilepsy, unspecified, not intractable, with status epilepticus: Secondary | ICD-10-CM | POA: Diagnosis not present

## 2014-10-12 DIAGNOSIS — G40909 Epilepsy, unspecified, not intractable, without status epilepticus: Secondary | ICD-10-CM | POA: Diagnosis not present

## 2014-10-12 DIAGNOSIS — K219 Gastro-esophageal reflux disease without esophagitis: Secondary | ICD-10-CM | POA: Diagnosis not present

## 2014-10-12 DIAGNOSIS — F79 Unspecified intellectual disabilities: Secondary | ICD-10-CM | POA: Diagnosis not present

## 2014-10-12 LAB — CBC
HEMATOCRIT: 45.5 % (ref 39.0–52.0)
Hemoglobin: 14.6 g/dL (ref 13.0–17.0)
MCH: 29.4 pg (ref 26.0–34.0)
MCHC: 32.1 g/dL (ref 30.0–36.0)
MCV: 91.5 fL (ref 78.0–100.0)
Platelets: 170 10*3/uL (ref 150–400)
RBC: 4.97 MIL/uL (ref 4.22–5.81)
RDW: 13.5 % (ref 11.5–15.5)
WBC: 7.4 10*3/uL (ref 4.0–10.5)

## 2014-10-12 LAB — BASIC METABOLIC PANEL
ANION GAP: 8 (ref 5–15)
BUN: 17 mg/dL (ref 6–23)
CO2: 29 mmol/L (ref 19–32)
CREATININE: 1.09 mg/dL (ref 0.50–1.35)
Calcium: 8.8 mg/dL (ref 8.4–10.5)
Chloride: 110 mmol/L (ref 96–112)
GFR calc Af Amer: 80 mL/min — ABNORMAL LOW (ref >90)
GFR calc non Af Amer: 69 mL/min — ABNORMAL LOW (ref >90)
Glucose, Bld: 108 mg/dL — ABNORMAL HIGH (ref 70–99)
POTASSIUM: 3.6 mmol/L (ref 3.5–5.1)
SODIUM: 147 mmol/L — AB (ref 135–145)

## 2014-10-12 MED ORDER — LEVETIRACETAM 1000 MG PO TABS: 1000.0000 mg | ORAL_TABLET | Freq: Two times a day (BID) | ORAL | Status: DC

## 2014-10-12 MED ORDER — RESOURCE THICKENUP CLEAR PO POWD
ORAL | Status: DC | PRN
  Filled 2014-10-12: qty 125

## 2014-10-12 NOTE — Clinical Social Work Note (Signed)
Patient was to discharge to group home today. Group home administrator was informed of this, but claims she did not know patient was discharging today and could not make arrangements for patient to return today. CSW explained that SNF search process would have to be started since group home is no longer appearing to be a definite option for discharge. Unfortunately patient will be a level II PASRR. Group home administrator states that she should be able to accept patient back tomorrow when she will have more staff to accommodate. She does request to have PT see the patient again in her presence so she can see how the patient maneuvers stairs. CSW has made RN aware of this. CSW has left report for covering CSW.   Liz Beach MSW, [place], [place], [phone]

## 2014-10-12 NOTE — Progress Notes (Signed)
Speech Language Pathology Treatment: Dysphagia  Patient Details Name: Marco [name] MRN: [id number] DOB: [birth year] Today's Date: 10/12/2014 Time: 8295-6213 SLP Time Calculation (min) (ACUTE ONLY): 16 min  Assessment / Plan / Recommendation Clinical Impression  Goal of session was to determine pt tolerance of current dys 1/ thin liquid diet. Pt coughed intermittently throughout session and exhibited delayed cough following large sips of thin liquids and solid consistencies, suspect s/s of aspiration. Caregiver discussed that coughing during meals is normal for pt and explained that pt needed max cues to slow down during meals and take small sips and bites of food. Discussed with caregiver concerns that pt may be aspirating during meals. Recommend an objective swallow study to evaluate further. Recommend pt continue current diet. Swallow study will be completed this morning.    HPI HPI: 64 y.o. male with Past medical history of mental retardation, seizure disorder, GERD, squamous cell lung cancer on surveillance. Patient presents s/p seizure, MRI negative for acute changes.   Pertinent Vitals    SLP Plan  Continue with current plan of care    Recommendations Diet recommendations: Dysphagia 2 (fine chop);Nectar-thick liquid Liquids provided via: Cup;No straw Medication Administration: Whole meds with puree Supervision: Staff to assist with self feeding Compensations: Slow rate;Small sips/bites;Clear throat intermittently Postural Changes and/or Swallow Maneuvers: Upright 30-60 min after meal              Plan: Continue with current plan of care    GO     Bermudez-Bosch, Estephani Popper 10/12/2014, 10:15 AM

## 2014-10-12 NOTE — Procedures (Signed)
Objective Swallowing Evaluation: Modified Barium Swallowing Study  Patient Details  Name: Marco [name] MRN: [id number] Date of Birth: [birth year]  Today's Date: 10/12/2014 Time: SLP Start Time (ACUTE ONLY): 0950-SLP Stop Time (ACUTE ONLY): 1005 SLP Time Calculation (min) (ACUTE ONLY): 15 min  Past Medical History:  Past Medical History  Diagnosis Date  . Mental retardation   . Seizure disorder   . Lung cancer    Past Surgical History: History reviewed. No pertinent past surgical history. HPI:  HPI: 64 y.o. male with Past medical history of mental retardation, seizure disorder, GERD, squamous cell lung cancer on surveillance. Patient presents s/p seizure, MRI negative for acute changes.  No Data Recorded  Assessment / Plan / Recommendation CHL IP CLINICAL IMPRESSIONS 10/12/2014  Dysphagia Diagnosis Moderate pharyngeal phase dysphagia;Moderate oral phase dysphagia  Clinical impression Pt presetns with a multifactoral dysphagia warranting diet modification to decrease risk of aspiration. Pt is very impulsive, taking large consecutive swallows and requiring max assist to reduce bolus size. The oral phase is primarily impaired with solids as pt has 2 teeth and does not make an effort to masticate soft solids, swallowing them whole, which become firmly lodged in pts large valleculae with no response from pt. Motor sensory deficits also evident in oropharyngeal swallow including dealyed swallow, decreased mobility, particularly in the base of tongue, and moderate vallecular and pharyngeal wall residuals post swallow. The pt silently aspirates thin liquids before the swallow and aspirated thin residuals post swallow. Cues for a chin tuck or even small sips were not fully effective. Residuals still present with nectar thick liquids, but no aspiration observed and penetrate was minimal. Goals will include tolerance of dys 2 (ground) diet with nectar thick liquids with small sips, followed by second  swallows.       CHL IP TREATMENT RECOMMENDATION 10/12/2014  Treatment Plan Recommendations Therapy as outlined in treatment plan below     CHL IP DIET RECOMMENDATION 10/12/2014  Diet Recommendations Dysphagia 2 (Fine chop);Nectar-thick liquid  Liquid Administration via Cup  Medication Administration Whole meds with puree  Compensations Slow rate;Small sips/bites;Multiple dry swallows after each bite/sip;Clear throat intermittently  Postural Changes and/or Swallow Maneuvers Upright 30-60 min after meal     CHL IP OTHER RECOMMENDATIONS 10/12/2014  Recommended Consults (None)  Oral Care Recommendations Oral care BID  Other Recommendations (None)     CHL IP FOLLOW UP RECOMMENDATIONS 10/10/2014  Follow up Recommendations Inpatient Rehab;24 hour supervision/assistance     CHL IP FREQUENCY AND DURATION 10/12/2014  Speech Therapy Frequency (ACUTE ONLY) min 2x/week  Treatment Duration 2 weeks     Pertinent Vitals/Pain NA     SLP Swallow Goals No flowsheet data found.  No flowsheet data found.    CHL IP REASON FOR REFERRAL 10/12/2014  Reason for Referral Objectively evaluate swallowing function     CHL IP ORAL PHASE 10/12/2014  Lips (None)  Tongue (None)  Mucous membranes (None)  Nutritional status (None)  Other (None)  Oxygen therapy (None)  Oral Phase Impaired  Oral - Pudding Teaspoon (None)  Oral - Pudding Cup (None)  Oral - Honey Teaspoon (None)  Oral - Honey Cup (None)  Oral - Honey Syringe (None)  Oral - Nectar Teaspoon (None)  Oral - Nectar Cup WFL  Oral - Nectar Straw WFL  Oral - Nectar Syringe (None)  Oral - Ice Chips (None)  Oral - Thin Teaspoon (None)  Oral - Thin Cup WFL  Oral - Thin Straw WFL  Oral - Thin Syringe (None)  Oral - Puree WFL  Oral - Mechanical Soft Impaired mastication  Oral - Regular (None)  Oral - Multi-consistency (None)  Oral - Pill (None)  Oral Phase - Comment (None)      CHL IP PHARYNGEAL PHASE 10/12/2014  Pharyngeal Phase Impaired   Pharyngeal - Pudding Teaspoon (None)  Penetration/Aspiration details (pudding teaspoon) (None)  Pharyngeal - Pudding Cup (None)  Penetration/Aspiration details (pudding cup) (None)  Pharyngeal - Honey Teaspoon (None)  Penetration/Aspiration details (honey teaspoon) (None)  Pharyngeal - Honey Cup (None)  Penetration/Aspiration details (honey cup) (None)  Pharyngeal - Honey Syringe (None)  Penetration/Aspiration details (honey syringe) (None)  Pharyngeal - Nectar Teaspoon (None)  Penetration/Aspiration details (nectar teaspoon) (None)  Pharyngeal - Nectar Cup Delayed swallow initiation;Premature spillage to valleculae;Reduced epiglottic inversion;Reduced airway/laryngeal closure;Reduced anterior laryngeal mobility;Reduced laryngeal elevation;Penetration/Aspiration after swallow;Reduced tongue base retraction;Pharyngeal residue - valleculae;Pharyngeal residue - pyriform sinuses  Penetration/Aspiration details (nectar cup) Material enters airway, remains ABOVE vocal cords and not ejected out;Material does not enter airway  Pharyngeal - Nectar Straw Delayed swallow initiation;Premature spillage to valleculae;Reduced epiglottic inversion;Reduced airway/laryngeal closure;Reduced anterior laryngeal mobility;Reduced laryngeal elevation;Penetration/Aspiration after swallow;Reduced tongue base retraction;Pharyngeal residue - valleculae;Pharyngeal residue - pyriform sinuses  Penetration/Aspiration details (nectar straw) Material does not enter airway;Material enters airway, remains ABOVE vocal cords and not ejected out  Pharyngeal - Nectar Syringe (None)  Penetration/Aspiration details (nectar syringe) (None)  Pharyngeal - Ice Chips (None)  Penetration/Aspiration details (ice chips) (None)  Pharyngeal - Thin Teaspoon (None)  Penetration/Aspiration details (thin teaspoon) (None)  Pharyngeal - Thin Cup Delayed swallow initiation;Premature spillage to valleculae;Reduced epiglottic inversion;Reduced  airway/laryngeal closure;Reduced anterior laryngeal mobility;Reduced laryngeal elevation;Penetration/Aspiration after swallow;Reduced tongue base retraction;Pharyngeal residue - valleculae;Pharyngeal residue - pyriform sinuses;Penetration/Aspiration before swallow;Compensatory strategies attempted (Comment)  Penetration/Aspiration details (thin cup) Material enters airway, passes BELOW cords without attempt by patient to eject out (silent aspiration);Material enters airway, CONTACTS cords and not ejected out  Pharyngeal - Thin Straw Delayed swallow initiation;Premature spillage to valleculae;Reduced epiglottic inversion;Reduced airway/laryngeal closure;Reduced anterior laryngeal mobility;Reduced laryngeal elevation;Penetration/Aspiration after swallow;Reduced tongue base retraction;Pharyngeal residue - valleculae;Pharyngeal residue - pyriform sinuses;Penetration/Aspiration before swallow;Compensatory strategies attempted (Comment);Trace aspiration  Penetration/Aspiration details (thin straw) Material enters airway, passes BELOW cords without attempt by patient to eject out (silent aspiration);Material enters airway, CONTACTS cords and not ejected out  Pharyngeal - Thin Syringe (None)  Penetration/Aspiration details (thin syringe') (None)  Pharyngeal - Puree Delayed swallow initiation;Premature spillage to valleculae;Reduced epiglottic inversion;Reduced airway/laryngeal closure;Reduced anterior laryngeal mobility;Reduced laryngeal elevation;Reduced tongue base retraction;Pharyngeal residue - valleculae  Penetration/Aspiration details (puree) Material does not enter airway  Pharyngeal - Mechanical Soft Delayed swallow initiation;Premature spillage to valleculae;Reduced epiglottic inversion;Reduced airway/laryngeal closure;Reduced anterior laryngeal mobility;Reduced laryngeal elevation;Reduced tongue base retraction;Pharyngeal residue - valleculae  Penetration/Aspiration details (mechanical soft) (None)   Pharyngeal - Regular (None)  Penetration/Aspiration details (regular) (None)  Pharyngeal - Multi-consistency (None)  Penetration/Aspiration details (multi-consistency) (None)  Pharyngeal - Pill (None)  Penetration/Aspiration details (pill) (None)  Pharyngeal Comment (None)     No flowsheet data found.  No flowsheet data found.        Herbie [place], [place] CCC-SLP [phone]  Lynann Beaver 10/12/2014, 10:43 AM

## 2014-10-12 NOTE — Progress Notes (Signed)
Physical Therapy Treatment Patient Details Name: Marco [name] MRN: [id number] DOB: [birth year] Today's Date: 10/12/2014    History of Present Illness  64 y.o. male with Past medical history of mental retardation, seizure disorder, GERD, squamous cell lung cancer on surveillance. Patient presents s/p seizure, neuro work up pending.    PT Comments    Spoke with SW who states there are no beds open on CIR.  Assessed pt today for best d/c option.  Caregiver present and she was able to assist pt with ambulation to get a sense of how much A he needed.  She was concerned that he would not do well in SNF and would do better if he was able to go home.  Recommended A at all times when OOB and 24 hour S.   She called pt's sister to see if she would be able to A.  Caregiver states that between his sister and staff at group home they can provide needed level of care.  She inquired about gait belt and was educated on where to purchase and how to use it to A pt.  There are stairs at the group home and she was educated on how to A pt safely with stairs and to stand on the descending part of the stairs for increased safety of patient.  Recommend HHPT for continued therapy.  Follow Up Recommendations  Home health PT     Equipment Recommendations  Other (comment) (gait belt)    Recommendations for Other Services       Precautions / Restrictions Precautions Precautions: Fall Restrictions Weight Bearing Restrictions: No    Mobility  Bed Mobility Overal bed mobility: Modified Independent Bed Mobility: Supine to Sit              Transfers     Transfers: Sit to/from Stand Sit to Stand: Min assist         General transfer comment: bed giving support on back of legs during transfer to assist with initial balance  Ambulation/Gait Ambulation/Gait assistance: Min assist;Mod assist Ambulation Distance (Feet): 175 Feet Assistive device: 1 person hand held assist Gait Pattern/deviations:  Decreased step length - right;Decreased step length - left;Narrow base of support;Drifts right/left Gait velocity: WNL   General Gait Details: Initially unsteady and requiring MOD A of 1 HHA, but progressed to MIN A.  Caregiver able to walk with pt down the hall so she could get a feel for A level he required.   Stairs            Wheelchair Mobility    Modified Rankin (Stroke Patients Only)       Balance Overall balance assessment: Needs assistance Sitting-balance support: No upper extremity supported;Feet supported Sitting balance-Leahy Scale: Fair     Standing balance support: Single extremity supported Standing balance-Leahy Scale: Poor Standing balance comment: Needs A when up- general unsteadiness with decreased cognition.                    Cognition Arousal/Alertness: Awake/alert Behavior During Therapy: WFL for tasks assessed/performed;Impulsive Overall Cognitive Status: Within Functional Limits for tasks assessed         Following Commands: Follows one step commands consistently       General Comments: Caregiver reports he is doing much better and is about 80% back to normal.    Exercises      General Comments        Pertinent Vitals/Pain Pain Assessment: No/denies pain    Home Living  Prior Function            PT Goals (current goals can now be found in the care plan section) Acute Rehab PT Goals Patient Stated Goal: to get back to baseline PT Goal Formulation: Patient unable to participate in goal setting (spoke with caregiver) Time For Goal Achievement: 10/23/14 Potential to Achieve Goals: Good Progress towards PT goals: Progressing toward goals    Frequency  Min 3X/week    PT Plan Discharge plan needs to be updated    Co-evaluation             End of Session Equipment Utilized During Treatment: Gait belt Activity Tolerance: Patient tolerated treatment well Patient left: in bed;with  bed alarm set;with family/visitor present     Time: 7062-3762 PT Time Calculation (min) (ACUTE ONLY): 15 min  Charges:  $Gait Training: 8-22 mins                    G Codes:      Dina War[place]ton LUBECK 10/12/2014, 11:29 AM

## 2014-10-12 NOTE — Discharge Summary (Addendum)
Physician Discharge Summary  [place]us XNT:[id number] DOB: February 28, 1951 DOA: 10/07/2014  PCP: Leamon Arnt, MD  Admit date: 10/07/2014 Discharge date: 10/13/2014  Time spent: 25 minutes  Recommendations for Outpatient Follow-up:  1. Follow up with PCP in 1-2 weeks  Discharge Diagnoses:  Principal Problem:   Status epilepticus Active Problems:   Seizure disorder   GERD (gastroesophageal reflux disease)   Squamous cell lung cancer   Mental retardation   Altered mental status  Discharge Condition: Improved  Diet recommendation: Regular  Filed Weights   10/08/14 1700 10/13/14 0500  Weight: 69.5 kg (153 lb 3.5 oz) 66.5 kg (146 lb 9.7 oz)    History of present illness:  Please see admit h and p for details. Briefly, pt presented with status epilepticus with prolonged post-ictal state. The patient was admitted for further work up.  Hospital Course:  1. Status epilepticus 1. Remains on IV keppra at 1gm q12hrs 2. EEG with findings of seizure activity 3. No seizures seen thus far 4. MRI brain done with findings of edema and low level restricted diffusion in the hippocampus/mesial temporal lobe that are c/w post-ictal state 5. Neurology was consulted 6. PT/OT had been following. 7. Further discussed case with Neurology. Pt recommended to continue on 1gm keppra bid. No further neurologic w/o per Neurology recommendations to me this AM 2. Mood d/o 1. Seems stable 3. Hx Squamous cell lung ca 1. Had discussed with family earlier. Pt is s/p treatments which "shrunk" lung ca. Per family, lesion reportedly increased in size slightly and pt is to follow up shortly for outpatient imaging for interval change 4. GERD 1. Stable 5. Mental Retardation 1. Pt becomes periodically agitated and wanting to get out of bed, requiring chemical sedation 6. DVT prophylaxis 1. Heparin subQ  Procedures:  EEG  Consultations:  Neurology  Discharge Exam: Filed Vitals:   10/13/14 0019 10/13/14  0431 10/13/14 0500 10/13/14 1321  BP: 124/74 121/66  115/78  Pulse: 96 97  106  Temp: 98 F (36.7 C) 98.4 F (36.9 C)    TempSrc: Oral Oral    Resp: 18 18  16   Height:      Weight:   66.5 kg (146 lb 9.7 oz)   SpO2: 97% 100%  99%    General: Awake, in nad Cardiovascular: regular, s1, s2 Respiratory: normal resp effort, no wheezing  Discharge Instructions     Medication List    TAKE these medications        bisacodyl 5 MG EC tablet  Commonly known as:  DULCOLAX  Take 5 mg by mouth daily as needed for moderate constipation.     donepezil 10 MG tablet  Commonly known as:  ARICEPT  Take 10 mg by mouth at bedtime.     fluticasone 50 MCG/ACT nasal spray  Commonly known as:  FLONASE  Place 2 sprays into both nostrils daily.     levETIRAcetam 1000 MG tablet  Commonly known as:  KEPPRA  Take 1 tablet (1,000 mg total) by mouth 2 (two) times daily.     LORazepam 1 MG tablet  Commonly known as:  ATIVAN  Take 2 mg by mouth at bedtime.     omeprazole 20 MG capsule  Commonly known as:  PRILOSEC  Take 20 mg by mouth 2 (two) times daily before a meal.     risperiDONE 3 MG tablet  Commonly known as:  RISPERDAL  Take 3 mg by mouth at bedtime.       No Known  Allergies Follow-up Information    Follow up with [name],[name], MD. Schedule an appointment as soon as possible for a visit in 1 week.   Specialty:  Family Medicine   Contact information:   [address] [place] [phone]        The results of significant diagnostics from this hospitalization (including imaging, microbiology, ancillary and laboratory) are listed below for reference.    Significant Diagnostic Studies: Ct Head W Wo Contrast  10/08/2014   CLINICAL DATA:  Weakness seizure lasting 2 minutes, subsequent unresponsive and followup seizure. Unresponsive.  EXAM: CT HEAD WITHOUT AND WITH CONTRAST  TECHNIQUE: Contiguous axial images were obtained from the base of the skull  through the vertex without and with intravenous contrast  CONTRAST:  86mL OMNIPAQUE IOHEXOL 300 MG/ML  SOLN  COMPARISON:  None available for comparison at time of study interpretation.  FINDINGS: The ventricles and sulci are normal for age. No intraparenchymal hemorrhage, mass effect nor midline shift. Patchy supratentorial white matter hypodensities are less than expected for patient's age and though non-specific suggest sequelae of chronic small vessel ischemic disease. No acute large vascular territory infarcts. RIGHT inferior basal ganglia perivascular space. No abnormal parenchymal or extra-axial enhancement.  No abnormal extra-axial fluid collections. Basal cisterns are patent. Moderate calcific atherosclerosis of the carotid siphons.  No skull fracture. The included ocular globes and orbital contents are non-suspicious. The mastoid aircells and included paranasal sinuses are well-aerated.  IMPRESSION: No acute intracranial process ; normal CT of the head with and without contrast for age.   Electronically Signed   By: Elon Alas   On: 10/08/2014 03:45   Mr Brain W Wo Contrast  10/09/2014   CLINICAL DATA:  Mental status changes.  History of seizure disorder.  EXAM: MRI HEAD WITHOUT AND WITH CONTRAST  TECHNIQUE: Multiplanar, multiecho pulse sequences of the brain and surrounding structures were obtained without and with intravenous contrast.  CONTRAST:  17mL MULTIHANCE GADOBENATE DIMEGLUMINE 529 MG/ML IV SOLN  COMPARISON:  Head CT 10/08/2014  FINDINGS: Fast scanning was performed because of patient motion. The examination continues to be significantly motion degraded.  There is low level restricted diffusion and edema affecting the hippocampus/ mesial temporal lobe on the left consistent with postictal edema. The remainder the brain appears normal without evidence of old or acute infarction, mass lesion, hemorrhage, hydrocephalus or extra-axial collection. After contrast administration, no abnormal  enhancement occurs. No pituitary mass. No inflammatory sinus disease. No skull or skullbase lesion seen.  IMPRESSION: Edema and low level restricted diffusion in the hippocampus/mesial temporal lobe on the left, most typical of postictal change. The patient could be restudied in the future distant from any seizure event to see if this area has a normalized appearance or any definable permanent underlying abnormality.   Electronically Signed   By: Nelson Chimes M.D.   On: 10/09/2014 14:22   Dg Chest Port 1 View  10/07/2014   CLINICAL DATA:  Acute onset of seizure.  Initial encounter.  EXAM: PORTABLE CHEST - 1 VIEW  COMPARISON:  None.  FINDINGS: The lungs are well-aerated. Left midlung airspace opacification is compatible with pneumonia. There is no evidence of pleural effusion or pneumothorax.  The cardiomediastinal silhouette is within normal limits. No acute osseous abnormalities are seen.  IMPRESSION: Left midlung pneumonia noted.   Electronically Signed   By: Garald Balding M.D.   On: 10/07/2014 22:59   Dg Swallowing Func-speech Pathology  10/12/2014   Katherene Ponto  [place], CCC-SLP     10/12/2014 10:45 AM  Objective Swallowing Evaluation: Modified Barium Swallowing Study   Patient Details  Name: Albino Bufford MRN: [id number] Date of Birth: Jun 04, 1951  Today's Date: 10/12/2014 Time: SLP Start Time (ACUTE ONLY): 0950-SLP Stop Time (ACUTE  ONLY): 1005 SLP Time Calculation (min) (ACUTE ONLY): 15 min  Past Medical History:  Past Medical History  Diagnosis Date  . Mental retardation   . Seizure disorder   . Lung cancer    Past Surgical History: History reviewed. No pertinent past  surgical history. HPI:  HPI: 64 y.o. male with Past medical history of mental  retardation, seizure disorder, GERD, squamous cell lung cancer on  surveillance. Patient presents s/p seizure, MRI negative for  acute changes.  No Data Recorded  Assessment / Plan / Recommendation CHL IP CLINICAL IMPRESSIONS 10/12/2014  Dysphagia Diagnosis Moderate  pharyngeal phase dysphagia;Moderate  oral phase dysphagia  Clinical impression Pt presetns with a multifactoral dysphagia  warranting diet modification to decrease risk of aspiration. Pt  is very impulsive, taking large consecutive swallows and  requiring max assist to reduce bolus size. The oral phase is  primarily impaired with solids as pt has 2 teeth and does not  make an effort to masticate soft solids, swallowing them whole,  which become firmly lodged in pts large valleculae with no  response from pt. Motor sensory deficits also evident in  oropharyngeal swallow including dealyed swallow, decreased  mobility, particularly in the base of tongue, and moderate  vallecular and pharyngeal wall residuals post swallow. The pt  silently aspirates thin liquids before the swallow and aspirated  thin residuals post swallow. Cues for a chin tuck or even small  sips were not fully effective. Residuals still present with  nectar thick liquids, but no aspiration observed and penetrate  was minimal. Goals will include tolerance of dys 2 (ground) diet  with nectar thick liquids with small sips, followed by second  swallows.       CHL IP TREATMENT RECOMMENDATION 10/12/2014  Treatment Plan Recommendations Therapy as outlined in treatment  plan below    CHL IP DIET RECOMMENDATION 10/12/2014  Diet Recommendations Dysphagia 2 (Fine chop);Nectar-thick liquid  Liquid Administration via Cup  Medication Administration Whole meds with puree  Compensations Slow rate;Small sips/bites;Multiple dry swallows  after each bite/sip;Clear throat intermittently  Postural Changes and/or Swallow Maneuvers Upright 30-60 min after  meal     CHL IP OTHER RECOMMENDATIONS 10/12/2014  Recommended Consults (None)  Oral Care Recommendations Oral care BID  Other Recommendations (None)     CHL IP FOLLOW UP RECOMMENDATIONS 10/10/2014  Follow up Recommendations Inpatient Rehab;24 hour  supervision/assistance     CHL IP FREQUENCY AND DURATION 10/12/2014  Speech Therapy  Frequency (ACUTE ONLY) min 2x/week  Treatment Duration 2 weeks     Pertinent Vitals/Pain NA     SLP Swallow Goals No flowsheet data found.  No flowsheet data found.    CHL IP REASON FOR REFERRAL 10/12/2014  Reason for Referral Objectively evaluate swallowing function     CHL IP ORAL PHASE 10/12/2014  Lips (None)  Tongue (None)  Mucous membranes (None)  Nutritional status (None)  Other (None)  Oxygen therapy (None)  Oral Phase Impaired  Oral - Pudding Teaspoon (None)  Oral - Pudding Cup (None)  Oral - Honey Teaspoon (None)  Oral - Honey Cup (None)  Oral - Honey Syringe (None)  Oral - Nectar Teaspoon (None)  Oral - Nectar Cup WFL  Oral - Nectar Straw  WFL  Oral - Nectar Syringe (None)  Oral - Ice Chips (None)  Oral - Thin Teaspoon (None)  Oral - Thin Cup WFL  Oral - Thin Straw WFL  Oral - Thin Syringe (None)  Oral - Puree WFL  Oral - Mechanical Soft Impaired mastication  Oral - Regular (None)  Oral - Multi-consistency (None)  Oral - Pill (None)  Oral Phase - Comment (None)      CHL IP PHARYNGEAL PHASE 10/12/2014  Pharyngeal Phase Impaired  Pharyngeal - Pudding Teaspoon (None)  Penetration/Aspiration details (pudding teaspoon) (None)  Pharyngeal - Pudding Cup (None)  Penetration/Aspiration details (pudding cup) (None)  Pharyngeal - Honey Teaspoon (None)  Penetration/Aspiration details (honey teaspoon) (None)  Pharyngeal - Honey Cup (None)  Penetration/Aspiration details (honey cup) (None)  Pharyngeal - Honey Syringe (None)  Penetration/Aspiration details (honey syringe) (None) Pharyngeal  - Nectar Teaspoon (None)  Penetration/Aspiration details (nectar teaspoon) (None)  Pharyngeal - Nectar Cup Delayed swallow initiation;Premature  spillage to valleculae;Reduced epiglottic inversion;Reduced  airway/laryngeal closure;Reduced anterior laryngeal  mobility;Reduced laryngeal elevation;Penetration/Aspiration after  swallow;Reduced tongue base retraction;Pharyngeal residue -  valleculae;Pharyngeal residue - pyriform sinuses   Penetration/Aspiration details (nectar cup) Material enters  airway, remains ABOVE vocal cords and not ejected out;Material  does not enter airway  Pharyngeal - Nectar Straw Delayed swallow initiation;Premature  spillage to valleculae;Reduced epiglottic inversion;Reduced  airway/laryngeal closure;Reduced anterior laryngeal  mobility;Reduced laryngeal elevation;Penetration/Aspiration after  swallow;Reduced tongue base retraction;Pharyngeal residue -  valleculae;Pharyngeal residue - pyriform sinuses  Penetration/Aspiration details (nectar straw) Material does not  enter airway;Material enters airway, remains ABOVE vocal cords  and not ejected out  Pharyngeal - Nectar Syringe (None)  Penetration/Aspiration details (nectar syringe) (None)  Pharyngeal - Ice Chips (None)  Penetration/Aspiration details (ice chips) (None)  Pharyngeal - Thin Teaspoon (None)  Penetration/Aspiration details (thin teaspoon) (None)  Pharyngeal - Thin Cup Delayed swallow initiation;Premature  spillage to valleculae;Reduced epiglottic inversion;Reduced  airway/laryngeal closure;Reduced anterior laryngeal  mobility;Reduced laryngeal elevation;Penetration/Aspiration after  swallow;Reduced tongue base retraction;Pharyngeal residue -  valleculae;Pharyngeal residue - pyriform  sinuses;Penetration/Aspiration before swallow;Compensatory  strategies attempted (Comment)  Penetration/Aspiration details (thin cup) Material enters airway,  passes BELOW cords without attempt by patient to eject out  (silent aspiration);Material enters airway, CONTACTS cords and  not ejected out  Pharyngeal - Thin Straw Delayed swallow initiation;Premature  spillage to valleculae;Reduced epiglottic inversion;Reduced  airway/laryngeal closure;Reduced anterior laryngeal  mobility;Reduced laryngeal elevation;Penetration/Aspiration after  swallow;Reduced tongue base retraction;Pharyngeal residue -  valleculae;Pharyngeal residue - pyriform  sinuses;Penetration/Aspiration before  swallow;Compensatory  strategies attempted (Comment);Trace aspiration  Penetration/Aspiration details (thin straw) Material enters  airway, passes BELOW cords without attempt by patient to eject  out (silent aspiration);Material enters airway, CONTACTS cords  and not ejected out  Pharyngeal - Thin Syringe (None)  Penetration/Aspiration details (thin syringe') (None)  Pharyngeal - Puree Delayed swallow initiation;Premature spillage  to valleculae;Reduced epiglottic inversion;Reduced  airway/laryngeal closure;Reduced anterior laryngeal  mobility;Reduced laryngeal elevation;Reduced tongue base  retraction;Pharyngeal residue - valleculae Penetration/Aspiration  details (puree) Material does not enter airway  Pharyngeal - Mechanical Soft Delayed swallow initiation;Premature  spillage to valleculae;Reduced epiglottic inversion;Reduced  airway/laryngeal closure;Reduced anterior laryngeal  mobility;Reduced laryngeal elevation;Reduced tongue base  retraction;Pharyngeal residue - valleculae  Penetration/Aspiration details (mechanical soft) (None)  Pharyngeal - Regular (None)  Penetration/Aspiration details (regular) (None)  Pharyngeal - Multi-consistency (None)  Penetration/Aspiration details (multi-consistency) (None)  Pharyngeal - Pill (None)  Penetration/Aspiration details (pill) (None)  Pharyngeal Comment (None)     No flowsheet data found.  No flowsheet data found.  Herbie Baltimore, MA CCC-SLP [phone]  Lynann Beaver 10/12/2014, 10:43 AM     Microbiology: Recent Results (from the past 240 hour(s))  MRSA PCR Screening     Status: None   Collection Time: 10/08/14  5:04 PM  Result Value Ref Range Status   MRSA by PCR NEGATIVE NEGATIVE Final    Comment:        The GeneXpert MRSA Assay (FDA approved for NASAL specimens only), is one component of a comprehensive MRSA colonization surveillance program. It is not intended to diagnose MRSA infection nor to guide or monitor treatment for MRSA  infections.      Labs: Basic Metabolic Panel:  Recent Labs Lab 10/07/14 2039 10/07/14 2052 10/08/14 0655 10/12/14 0810  NA 140 142 141 147*  K 4.9 4.5 3.9 3.6  CL 109 102 104 110  CO2 23  --  29 29  GLUCOSE 221* 219* 127* 108*  BUN 11 14 12 17   CREATININE 1.54* 1.30 1.20 1.09  CALCIUM 8.9  --  8.8 8.8   Liver Function Tests:  Recent Labs Lab 10/07/14 2039 10/08/14 0655  AST 20 27  ALT 9 21  ALKPHOS 71 70  BILITOT 0.4 0.5  PROT 6.6 7.0  ALBUMIN 3.8 3.9   No results for input(s): LIPASE, AMYLASE in the last 168 hours.  Recent Labs Lab 10/08/14 0655  AMMONIA 41*   CBC:  Recent Labs Lab 10/07/14 2039 10/07/14 2052 10/08/14 0655 10/12/14 0810  WBC 11.1*  --  15.3* 7.4  NEUTROABS 10.0*  --  14.1*  --   HGB 13.9 15.6 14.4 14.6  HCT 41.8 46.0 43.3 45.5  MCV 87.8  --  87.8 91.5  PLT 164  --  173 170   Cardiac Enzymes:  Recent Labs Lab 10/08/14 0655  CKTOTAL 449*   BNP: BNP (last 3 results) No results for input(s): BNP in the last 8760 hours.  ProBNP (last 3 results) No results for input(s): PROBNP in the last 8760 hours.  CBG:  Recent Labs Lab 10/09/14 1459  GLUCAP 99    Signed:  Jermari Tamargo K  [facility] 10/13/2014, 2:15 PM

## 2014-10-12 NOTE — Care Management Note (Signed)
    Page 1 of 2   10/12/2014     12:53:43 PM CARE MANAGEMENT NOTE 10/12/2014  Patient:  Marco [name], Marco [name]   Account Number:  [id number]  Date Initiated:  10/12/2014  Documentation initiated by:  Tomi Bamberger  Subjective/Objective Assessment:   dx seizure  admit- from [place] [phone]  patient has medicare part b only as well.     Action/Plan:   pt eval- hhpt- patient has medicaid.   Anticipated DC Date:  10/12/2014   Anticipated DC Plan:  [place]  CM consult      [facility] Choice  HOME HEALTH   Choice offered to / List presented to:  C-2 HC POA / Guardian        HH arranged  [place]   Status of service:  Completed, signed off Medicare Important Message given?  YES (If response is "NO", the following Medicare IM given date fields will be blank) Date Medicare IM given:  10/12/2014 Medicare IM given by:  Tomi Bamberger Date Additional Medicare IM given:   Additional Medicare IM given by:    Discharge Disposition:    Per UR Regulation:  Reviewed for med. necessity/level of care/duration of stay  If discussed at [place] of Stay Meetings, dates discussed:    Comments:  10/12/14 [place], BSN [phone] patient is for poss dc today, NCM spoke with physical therapist and she states Marco [name] at the group home states they can provide 24 hr care for patient with [facility] services. NCM called Marco [name] and offered choice for hh fro hhrn she chose [place] , she wanted to know why patient could not get pt, informed her that medicaid would not cover unless patient has had cva or fx.  Marco [name] states they have a lot of stairs in the home so he would need to get physical therapy, she also states she thinks patient has medicare, she will check into and call NCM back.  NCM called financial counselor, Marco [name] she checked into this and states yes patient does have medicare part b only for outpt  services which will cover hh services. NCM called Marco [name] back and left message for her to call back.  Made referral to [facility] for hhpt, [facility] notified, she states she is in a meeting and she will call me back.   patient's soc is [phone].

## 2014-10-13 DIAGNOSIS — F79 Unspecified intellectual disabilities: Secondary | ICD-10-CM | POA: Diagnosis not present

## 2014-10-13 DIAGNOSIS — G40909 Epilepsy, unspecified, not intractable, without status epilepticus: Secondary | ICD-10-CM | POA: Diagnosis not present

## 2014-10-13 DIAGNOSIS — G40901 Epilepsy, unspecified, not intractable, with status epilepticus: Secondary | ICD-10-CM | POA: Diagnosis not present

## 2014-10-13 DIAGNOSIS — G40301 Generalized idiopathic epilepsy and epileptic syndromes, not intractable, with status epilepticus: Secondary | ICD-10-CM | POA: Diagnosis not present

## 2014-10-13 MED ORDER — AMOXICILLIN-POT CLAVULANATE 875-125 MG PO TABS
1.0000 | ORAL_TABLET | Freq: Two times a day (BID) | ORAL | Status: DC
Start: 2014-10-13 — End: 2014-12-14

## 2014-10-13 NOTE — Progress Notes (Signed)
Speech Language Pathology Treatment: Dysphagia  Patient Details Name: Tamika Nou MRN: [id number] DOB: 09/22/1950 Today's Date: 10/13/2014 Time: 1448-1856 SLP Time Calculation (min) (ACUTE ONLY): 12 min  Assessment / Plan / Recommendation Clinical Impression  Goal of session was to determine pt tolerance of current dys 2/ nectar thick liquid diet. PO trials with nectar thick liquids did not elicit s/s of aspiration. Reminded pt he needed to swallow twice following sips of liquids. Practiced with pt taking small sips of nectar thick liquids followed by two dry swallows. Pt required moderate cues to follow sips with two dry swallows. Recommend pt continue on dys 2/ nectar thick liquid diet and continued use of safe swallow strategies; small sips, slow rate during meals and swallow twice following sips of nectar thick liquids. Pt will require intermittent supervision to cue for safe swallow strategies.    HPI HPI: 64 y.o. male with Past medical history of mental retardation, seizure disorder, GERD, squamous cell lung cancer on surveillance. Patient presents s/p seizure, MRI negative for acute changes.   Pertinent Vitals    SLP Plan  Continue with current plan of care    Recommendations Diet recommendations: Dysphagia 2 (fine chop);Nectar-thick liquid Liquids provided via: Cup;No straw Medication Administration: Whole meds with puree Supervision: Staff to assist with self feeding;Full supervision/cueing for compensatory strategies Compensations: Slow rate;Small sips/bites;Multiple dry swallows after each bite/sip;Clear throat intermittently Postural Changes and/or Swallow Maneuvers: Seated upright 90 degrees              Plan: Continue with current plan of care    GO     Bermudez-Bosch, Kilo Eshelman 10/13/2014, 12:18 PM

## 2014-10-13 NOTE — Progress Notes (Signed)
Marco [name] discharged Group Home per MD order.  Discharge instructions reviewed and discussed with the [name], all questions and concerns answered. Copy of instructions and scripts given to patient.    Medication List    TAKE these medications        amoxicillin-clavulanate 875-125 MG per tablet  Commonly known as:  AUGMENTIN  Take 1 tablet by mouth 2 (two) times daily.     bisacodyl 5 MG EC tablet  Commonly known as:  DULCOLAX  Take 5 mg by mouth daily as needed for moderate constipation.     donepezil 10 MG tablet  Commonly known as:  ARICEPT  Take 10 mg by mouth at bedtime.     fluticasone 50 MCG/ACT nasal spray  Commonly known as:  FLONASE  Place 2 sprays into both nostrils daily.     levETIRAcetam 1000 MG tablet  Commonly known as:  KEPPRA  Take 1 tablet (1,000 mg total) by mouth 2 (two) times daily.     LORazepam 1 MG tablet  Commonly known as:  ATIVAN  Take 2 mg by mouth at bedtime.     omeprazole 20 MG capsule  Commonly known as:  PRILOSEC  Take 20 mg by mouth 2 (two) times daily before a meal.     risperiDONE 3 MG tablet  Commonly known as:  RISPERDAL  Take 3 mg by mouth at bedtime.        Patients skin is clean, dry and intact, no evidence of skin break down. IV site discontinued and catheter remains intact. Site without signs and symptoms of complications. Dressing and pressure applied.  Patient escorted to car by NT in a wheelchair,  no distress noted upon discharge.  [name], Hadriel Northup C 10/13/2014 5:21 PM

## 2014-10-13 NOTE — Progress Notes (Signed)
Physical Therapy Treatment Patient Details Name: Marco [name] MRN: [id number] DOB: 09/21/1950 Today's Date: 10/13/2014    History of Present Illness  64 y.o. male with Past medical history of mental retardation, seizure disorder, GERD, squamous cell lung cancer on surveillance. Patient presents s/p seizure, neuro work up pending.    PT Comments    Progressing well.  Will need more supervision initially until caregivers are satisfied with his safety.  Follow Up Recommendations        Equipment Recommendations  None recommended by PT    Recommendations for Other Services       Precautions / Restrictions Precautions Precautions: Fall    Mobility  Bed Mobility Overal bed mobility: Modified Independent                Transfers Overall transfer level: Needs assistance Equipment used: None Transfers: Sit to/from Stand Sit to Stand: Supervision         General transfer comment: used generally safe technique  Ambulation/Gait Ambulation/Gait assistance: Supervision;Min guard Ambulation Distance (Feet): 500 Feet Assistive device: None Gait Pattern/deviations: Step-through pattern Gait velocity: WNL   General Gait Details: periods of mild unsteadiness during turns, scanning or quick changes of task.   Stairs Stairs: Yes Stairs assistance: Supervision Stair Management: One rail Right;Two rails;Alternating pattern;Forwards Number of Stairs: 10 General stair comments: generally safe  Wheelchair Mobility    Modified Rankin (Stroke Patients Only)       Balance Overall balance assessment: Needs assistance Sitting-balance support: No upper extremity supported Sitting balance-Leahy Scale: Fair     Standing balance support: No upper extremity supported Standing balance-Leahy Scale: Fair                      Cognition Arousal/Alertness: Awake/alert Behavior During Therapy: WFL for tasks assessed/performed;Impulsive Overall Cognitive Status:  Within Functional Limits for tasks assessed Area of Impairment: Orientation;Attention;Memory;Following commands;Awareness;Problem solving Orientation Level: Disoriented to;Place;Time;Situation Current Attention Level: Focused Memory: Decreased short-term memory Following Commands: Follows one step commands consistently     Problem Solving: Slow processing;Decreased initiation;Difficulty sequencing;Requires verbal cues;Requires tactile cues General Comments: Caregiver reports he is ready to discharge    Exercises      General Comments        Pertinent Vitals/Pain Pain Assessment: No/denies pain    Home Living                      Prior Function            PT Goals (current goals can now be found in the care plan section) Acute Rehab PT Goals Patient Stated Goal: to get back to baseline PT Goal Formulation: Patient unable to participate in goal setting Time For Goal Achievement: 10/23/14 Potential to Achieve Goals: Good Progress towards PT goals: Progressing toward goals    Frequency  Min 3X/week    PT Plan Current plan remains appropriate    Co-evaluation             End of Session   Activity Tolerance: Patient tolerated treatment well Patient left: in bed;with family/visitor present     Time: 6962-9528 PT Time Calculation (min) (ACUTE ONLY): 14 min  Charges:  $Gait Training: 8-22 mins                    G Codes:      Stephanee Barcomb, Tessie Fass 10/13/2014, 1:20 PM 10/13/2014  Donnella Sham, PT [phone]  (pager)

## 2014-10-13 NOTE — Progress Notes (Signed)
[facility] PROGRESS NOTE  Marco [name] GLO:[id number] DOB: [birth year] DOA: 10/07/2014 PCP: Leamon Arnt, MD  Assessment/Plan: 1. Status epilepticus 1. Remains on IV keppra at 1gm q12hrs 2. EEG with findings of seizure activity 3. No seizures seen thus far 4. MRI brain done with findings of edema and low level restricted diffusion in the hippocampus/mesial temporal lobe that are c/w post-ictal state 5. Neurology is following 6. PT/OT following. Recs for CIR 7. Family reports intermittent bouts of confusion. Seemed at baseline yesterday AM when seen. When seen today, was more sedate s/p haldol 2. Mood d/o 1. Seems stable 3. Hx Squamous cell lung ca 1. Had discussed with family earlier. Pt is s/p treatments which "shrunk" lung ca. Per family, lesion reportedly increased in size slightly and pt is to follow up shortly for outpatient imaging for interval change 4. GERD 1. Stable 5. Mental Retardation 1. Pt becomes periodically agitated and wanting to get out of bed, requiring chemical sedation 6. DVT prophylaxis 1. Heparin subQ 7. Suspected aspiration PNA 1. Caregiver notes increased cough while on floor 2. No fevers. No leukocytosis 3. Pt does have mild opacity over L midlung 4. Will have pt continue on augmentin on d/c to cover for possible aspiration PNA  Code Status: Full Family Communication: Pt in room Disposition Plan: pending CIR eval pending   Consultants:  neurology  Procedures:    Antibiotics:   (indicate start date, and stop date if known)  HPI/Subjective: Wants to go home today.  Objective: Filed Vitals:   10/13/14 0019 10/13/14 0431 10/13/14 0500 10/13/14 1321  BP: 124/74 121/66  115/78  Pulse: 96 97  106  Temp: 98 F (36.7 C) 98.4 F (36.9 C)    TempSrc: Oral Oral    Resp: 18 18  16   Height:      Weight:   66.5 kg (146 lb 9.7 oz)   SpO2: 97% 100%  99%    Intake/Output Summary (Last 24 hours) at 10/13/14 1415 Last data filed at  10/13/14 0956  Gross per 24 hour  Intake    760 ml  Output   1000 ml  Net   -240 ml   Filed Weights   10/08/14 1700 10/13/14 0500  Weight: 69.5 kg (153 lb 3.5 oz) 66.5 kg (146 lb 9.7 oz)    Exam:   General:  Awake, sitting in bed, in nad, conversant  Cardiovascular: regular, s1, s2  Respiratory: normal resp effort, no wheezing or crackles  Abdomen: soft,nondistended  Musculoskeletal: perfused, no clubbing   Data Reviewed: Basic Metabolic Panel:  Recent Labs Lab 10/07/14 2039 10/07/14 2052 10/08/14 0655 10/12/14 0810  NA 140 142 141 147*  K 4.9 4.5 3.9 3.6  CL 109 102 104 110  CO2 23  --  29 29  GLUCOSE 221* 219* 127* 108*  BUN 11 14 12 17   CREATININE 1.54* 1.30 1.20 1.09  CALCIUM 8.9  --  8.8 8.8   Liver Function Tests:  Recent Labs Lab 10/07/14 2039 10/08/14 0655  AST 20 27  ALT 9 21  ALKPHOS 71 70  BILITOT 0.4 0.5  PROT 6.6 7.0  ALBUMIN 3.8 3.9   No results for input(s): LIPASE, AMYLASE in the last 168 hours.  Recent Labs Lab 10/08/14 0655  AMMONIA 41*   CBC:  Recent Labs Lab 10/07/14 2039 10/07/14 2052 10/08/14 0655 10/12/14 0810  WBC 11.1*  --  15.3* 7.4  NEUTROABS 10.0*  --  14.1*  --   HGB 13.9 15.6 14.4  14.6  HCT 41.8 46.0 43.3 45.5  MCV 87.8  --  87.8 91.5  PLT 164  --  173 170   Cardiac Enzymes:  Recent Labs Lab 10/08/14 0655  CKTOTAL 449*   BNP (last 3 results) No results for input(s): BNP in the last 8760 hours.  ProBNP (last 3 results) No results for input(s): PROBNP in the last 8760 hours.  CBG:  Recent Labs Lab 10/09/14 1459  GLUCAP 99    Recent Results (from the past 240 hour(s))  MRSA PCR Screening     Status: None   Collection Time: 10/08/14  5:04 PM  Result Value Ref Range Status   MRSA by PCR NEGATIVE NEGATIVE Final    Comment:        The GeneXpert MRSA Assay (FDA approved for NASAL specimens only), is one component of a comprehensive MRSA colonization surveillance program. It is  not intended to diagnose MRSA infection nor to guide or monitor treatment for MRSA infections.      Studies: Dg Swallowing Func-speech Pathology  10/12/2014   Katherene Ponto Debl[place], CCC-SLP     10/12/2014 10:45 AM  Objective Swallowing Evaluation: Modified Barium Swallowing Study   Patient Details  Name: Marco [name] MRN: [id number] Date of Birth: 05-04-1951  Today's Date: 10/12/2014 Time: SLP Start Time (ACUTE ONLY): 0950-SLP Stop Time (ACUTE  ONLY): 1005 SLP Time Calculation (min) (ACUTE ONLY): 15 min  Past Medical History:  Past Medical History  Diagnosis Date  . Mental retardation   . Seizure disorder   . Lung cancer    Past Surgical History: History reviewed. No pertinent past  surgical history. HPI:  HPI: 64 y.o. male with Past medical history of mental  retardation, seizure disorder, GERD, squamous cell lung cancer on  surveillance. Patient presents s/p seizure, MRI negative for  acute changes.  No Data Recorded  Assessment / Plan / Recommendation CHL IP CLINICAL IMPRESSIONS 10/12/2014  Dysphagia Diagnosis Moderate pharyngeal phase dysphagia;Moderate  oral phase dysphagia  Clinical impression Pt presetns with a multifactoral dysphagia  warranting diet modification to decrease risk of aspiration. Pt  is very impulsive, taking large consecutive swallows and  requiring max assist to reduce bolus size. The oral phase is  primarily impaired with solids as pt has 2 teeth and does not  make an effort to masticate soft solids, swallowing them whole,  which become firmly lodged in pts large valleculae with no  response from pt. Motor sensory deficits also evident in  oropharyngeal swallow including dealyed swallow, decreased  mobility, particularly in the base of tongue, and moderate  vallecular and pharyngeal wall residuals post swallow. The pt  silently aspirates thin liquids before the swallow and aspirated  thin residuals post swallow. Cues for a chin tuck or even small  sips were not fully effective.  Residuals still present with  nectar thick liquids, but no aspiration observed and penetrate  was minimal. Goals will include tolerance of dys 2 (ground) diet  with nectar thick liquids with small sips, followed by second  swallows.       CHL IP TREATMENT RECOMMENDATION 10/12/2014  Treatment Plan Recommendations Therapy as outlined in treatment  plan below    CHL IP DIET RECOMMENDATION 10/12/2014  Diet Recommendations Dysphagia 2 (Fine chop);Nectar-thick liquid  Liquid Administration via Cup  Medication Administration Whole meds with puree  Compensations Slow rate;Small sips/bites;Multiple dry swallows  after each bite/sip;Clear throat intermittently  Postural Changes and/or Swallow Maneuvers Upright 30-60 min after  meal  CHL IP OTHER RECOMMENDATIONS 10/12/2014  Recommended Consults (None)  Oral Care Recommendations Oral care BID  Other Recommendations (None)     CHL IP FOLLOW UP RECOMMENDATIONS 10/10/2014  Follow up Recommendations Inpatient Rehab;24 hour  supervision/assistance     CHL IP FREQUENCY AND DURATION 10/12/2014  Speech Therapy Frequency (ACUTE ONLY) min 2x/week  Treatment Duration 2 weeks     Pertinent Vitals/Pain NA     SLP Swallow Goals No flowsheet data found.  No flowsheet data found.    CHL IP REASON FOR REFERRAL 10/12/2014  Reason for Referral Objectively evaluate swallowing function     CHL IP ORAL PHASE 10/12/2014  Lips (None)  Tongue (None)  Mucous membranes (None)  Nutritional status (None)  Other (None)  Oxygen therapy (None)  Oral Phase Impaired  Oral - Pudding Teaspoon (None)  Oral - Pudding Cup (None)  Oral - Honey Teaspoon (None)  Oral - Honey Cup (None)  Oral - Honey Syringe (None)  Oral - Nectar Teaspoon (None)  Oral - Nectar Cup WFL  Oral - Nectar Straw WFL  Oral - Nectar Syringe (None)  Oral - Ice Chips (None)  Oral - Thin Teaspoon (None)  Oral - Thin Cup WFL  Oral - Thin Straw WFL  Oral - Thin Syringe (None)  Oral - Puree WFL  Oral - Mechanical Soft Impaired mastication  Oral - Regular (None)   Oral - Multi-consistency (None)  Oral - Pill (None)  Oral Phase - Comment (None)      CHL IP PHARYNGEAL PHASE 10/12/2014  Pharyngeal Phase Impaired  Pharyngeal - Pudding Teaspoon (None)  Penetration/Aspiration details (pudding teaspoon) (None)  Pharyngeal - Pudding Cup (None)  Penetration/Aspiration details (pudding cup) (None)  Pharyngeal - Honey Teaspoon (None)  Penetration/Aspiration details (honey teaspoon) (None)  Pharyngeal - Honey Cup (None)  Penetration/Aspiration details (honey cup) (None)  Pharyngeal - Honey Syringe (None)  Penetration/Aspiration details (honey syringe) (None) Pharyngeal  - Nectar Teaspoon (None)  Penetration/Aspiration details (nectar teaspoon) (None)  Pharyngeal - Nectar Cup Delayed swallow initiation;Premature  spillage to valleculae;Reduced epiglottic inversion;Reduced  airway/laryngeal closure;Reduced anterior laryngeal  mobility;Reduced laryngeal elevation;Penetration/Aspiration after  swallow;Reduced tongue base retraction;Pharyngeal residue -  valleculae;Pharyngeal residue - pyriform sinuses  Penetration/Aspiration details (nectar cup) Material enters  airway, remains ABOVE vocal cords and not ejected out;Material  does not enter airway  Pharyngeal - Nectar Straw Delayed swallow initiation;Premature  spillage to valleculae;Reduced epiglottic inversion;Reduced  airway/laryngeal closure;Reduced anterior laryngeal  mobility;Reduced laryngeal elevation;Penetration/Aspiration after  swallow;Reduced tongue base retraction;Pharyngeal residue -  valleculae;Pharyngeal residue - pyriform sinuses  Penetration/Aspiration details (nectar straw) Material does not  enter airway;Material enters airway, remains ABOVE vocal cords  and not ejected out  Pharyngeal - Nectar Syringe (None)  Penetration/Aspiration details (nectar syringe) (None)  Pharyngeal - Ice Chips (None)  Penetration/Aspiration details (ice chips) (None)  Pharyngeal - Thin Teaspoon (None)  Penetration/Aspiration details (thin  teaspoon) (None)  Pharyngeal - Thin Cup Delayed swallow initiation;Premature  spillage to valleculae;Reduced epiglottic inversion;Reduced  airway/laryngeal closure;Reduced anterior laryngeal  mobility;Reduced laryngeal elevation;Penetration/Aspiration after  swallow;Reduced tongue base retraction;Pharyngeal residue -  valleculae;Pharyngeal residue - pyriform  sinuses;Penetration/Aspiration before swallow;Compensatory  strategies attempted (Comment)  Penetration/Aspiration details (thin cup) Material enters airway,  passes BELOW cords without attempt by patient to eject out  (silent aspiration);Material enters airway, CONTACTS cords and  not ejected out  Pharyngeal - Thin Straw Delayed swallow initiation;Premature  spillage to valleculae;Reduced epiglottic inversion;Reduced  airway/laryngeal closure;Reduced anterior laryngeal  mobility;Reduced laryngeal elevation;Penetration/Aspiration after  swallow;Reduced tongue base retraction;Pharyngeal residue -  valleculae;Pharyngeal residue - pyriform  sinuses;Penetration/Aspiration before swallow;Compensatory  strategies attempted (Comment);Trace aspiration  Penetration/Aspiration details (thin straw) Material enters  airway, passes BELOW cords without attempt by patient to eject  out (silent aspiration);Material enters airway, CONTACTS cords  and not ejected out  Pharyngeal - Thin Syringe (None)  Penetration/Aspiration details (thin syringe') (None)  Pharyngeal - Puree Delayed swallow initiation;Premature spillage  to valleculae;Reduced epiglottic inversion;Reduced  airway/laryngeal closure;Reduced anterior laryngeal  mobility;Reduced laryngeal elevation;Reduced tongue base  retraction;Pharyngeal residue - valleculae Penetration/Aspiration  details (puree) Material does not enter airway  Pharyngeal - Mechanical Soft Delayed swallow initiation;Premature  spillage to valleculae;Reduced epiglottic inversion;Reduced  airway/laryngeal closure;Reduced anterior laryngeal   mobility;Reduced laryngeal elevation;Reduced tongue base  retraction;Pharyngeal residue - valleculae  Penetration/Aspiration details (mechanical soft) (None)  Pharyngeal - Regular (None)  Penetration/Aspiration details (regular) (None)  Pharyngeal - Multi-consistency (None)  Penetration/Aspiration details (multi-consistency) (None)  Pharyngeal - Pill (None)  Penetration/Aspiration details (pill) (None)  Pharyngeal Comment (None)     No flowsheet data found.  No flowsheet data found.        Herbie [place], [place] CCC-SLP [phone]  Lynann Beaver 10/12/2014, 10:43 AM     Scheduled Meds: . fluticasone  2 spray Each Nare Daily  . heparin  5,000 Units Subcutaneous 3 times per day  . levETIRAcetam  1,000 mg Intravenous Q12H  . LORazepam  2 mg Oral QHS  . risperiDONE  3 mg Oral QHS   Continuous Infusions:   Principal Problem:   Status epilepticus Active Problems:   Seizure disorder   GERD (gastroesophageal reflux disease)   Squamous cell lung cancer   Mental retardation   Altered mental status   Raydon Chappuis K  [facility] Pager [phone]. If 7PM-7AM, please contact night-coverage at [email or web address], password [facility] 10/13/2014, 2:15 PM  LOS: 6 days

## 2014-10-13 NOTE — Clinical Social Work Note (Signed)
Clinical Social Worker received notification from CM that patient was medically ready for discharge and Group Home representative had made contact to provide transportation.  CSW provided RN with discharge summary per facility request.  No further social work needs at this time.  Please reconsult if needs arise prior to discharge.  Barbette Or, [place]

## 2014-10-14 ENCOUNTER — Ambulatory Visit: Payer: Medicare Other | Admitting: Neurology

## 2014-11-04 ENCOUNTER — Ambulatory Visit: Payer: Medicare Other | Admitting: Neurology

## 2014-11-06 ENCOUNTER — Other Ambulatory Visit: Payer: Self-pay | Admitting: *Deleted

## 2014-11-06 ENCOUNTER — Telehealth: Payer: Self-pay | Admitting: Neurology

## 2014-11-06 MED ORDER — LEVETIRACETAM 750 MG PO TABS: 750.0000 mg | ORAL_TABLET | Freq: Two times a day (BID) | ORAL | Status: DC

## 2014-11-06 NOTE — Telephone Encounter (Signed)
[place] with [facility] @ [phone], requesting refill for Rx levETIRAcetam (KEPPRA) 750 MG tablet, 1000 mg tabs.  Please call and advise.

## 2014-11-09 NOTE — Progress Notes (Signed)
Late entry due to missed g-code.   2014/12/08 1500  OT G-codes **NOT FOR INPATIENT CLASS**  Functional Assessment Tool Used clinical judgement  Functional Limitation Self care  Self Care Current Status [phone]) CM  Self Care Goal Status (F5844) CI  08-Dec-2014 Nestor Lewandowsky, OTR/L Pager: [phone]

## 2014-11-09 NOTE — Progress Notes (Signed)
SLP - Late Entry    10/12/14 1015  SLP G-Codes **NOT FOR INPATIENT CLASS**  Functional Assessment Tool Used skilled clinical judgment  Functional Limitations Swallowing  Swallow Current Status (O1751) CK  Swallow Goal Status (W2585) CJ  SLP Evaluations  $ SLP Speech Visit 1 Procedure  SLP Evaluations  $MBS Swallow 1 Procedure  $Swallowing Treatment 1 Procedure   Entered for Marco [name], SLP   Germain Osgood, M.A. CCC-SLP [phone]

## 2014-11-09 NOTE — Progress Notes (Signed)
SLP - Late Entry    10/09/14 1623  SLP G-Codes **NOT FOR INPATIENT CLASS**  Functional Assessment Tool Used skilled clinical judgment  Functional Limitations Motor speech  Motor Speech Current Status [phone]) CM  Motor Speech Goal Status (A3094) CM  Motor Speech Goal Status (M7680) CM  SLP Evaluations  $ SLP Speech Visit 1 Procedure  SLP Evaluations  $ SLP EVAL LANGUAGE/SOUND PRODUCTION 1 Procedure    Germain Osgood, M.A. CCC-SLP [phone]

## 2014-11-10 NOTE — Telephone Encounter (Addendum)
April with [facility] [phone] is calling in regard to Keppra refill.  She states they were told that the Rx S/B for 1000 mg.  Please advise

## 2014-11-10 NOTE — Telephone Encounter (Signed)
OK to refill as 1000 bid thanks

## 2014-11-10 NOTE — Telephone Encounter (Signed)
I called back.  Spoke with [facility].  She said they did receive the Rx sent for Keppra 750mg  BID, however, the patient was seen in the hospital and prescribed 1000mg  BID.  She is requesting an updated Rx.  Patient has an appt on 04/04 with Dr Jaynee Eagles.  Okay to refill as 100mg  BID?  Please advise.  Thank you.

## 2014-11-11 ENCOUNTER — Ambulatory Visit (HOSPITAL_COMMUNITY)
Admission: RE | Admit: 2014-11-11 | Discharge: 2014-11-11 | Disposition: A | Discharge status: Home / Self Care | Payer: Medicare Other | Source: Ambulatory Visit | Attending: Internal Medicine | Admitting: Internal Medicine

## 2014-11-11 ENCOUNTER — Encounter: Payer: Self-pay | Admitting: Internal Medicine

## 2014-11-11 ENCOUNTER — Encounter (HOSPITAL_COMMUNITY): Payer: Self-pay

## 2014-11-11 ENCOUNTER — Other Ambulatory Visit (HOSPITAL_BASED_OUTPATIENT_CLINIC_OR_DEPARTMENT_OTHER): Payer: Medicare Other

## 2014-11-11 DIAGNOSIS — C3492 Malignant neoplasm of unspecified part of left bronchus or lung: Secondary | ICD-10-CM

## 2014-11-11 DIAGNOSIS — Z9221 Personal history of antineoplastic chemotherapy: Secondary | ICD-10-CM | POA: Insufficient documentation

## 2014-11-11 DIAGNOSIS — C3412 Malignant neoplasm of upper lobe, left bronchus or lung: Secondary | ICD-10-CM

## 2014-11-11 LAB — CBC WITH DIFFERENTIAL/PLATELET
BASO%: 0.7 % (ref 0.0–2.0)
BASOS ABS: 0 10*3/uL (ref 0.0–0.1)
EOS ABS: 0 10*3/uL (ref 0.0–0.5)
EOS%: 0.3 % (ref 0.0–7.0)
HEMATOCRIT: 39.6 % (ref 38.4–49.9)
HEMOGLOBIN: 12.7 g/dL — AB (ref 13.0–17.1)
LYMPH%: 19.6 % (ref 14.0–49.0)
MCH: 29.1 pg (ref 27.2–33.4)
MCHC: 32.1 g/dL (ref 32.0–36.0)
MCV: 90.6 fL (ref 79.3–98.0)
MONO#: 0.6 10*3/uL (ref 0.1–0.9)
MONO%: 9.7 % (ref 0.0–14.0)
NEUT%: 69.7 % (ref 39.0–75.0)
NEUTROS ABS: 4.1 10*3/uL (ref 1.5–6.5)
Platelets: 199 10*3/uL (ref 140–400)
RBC: 4.37 10*6/uL (ref 4.20–5.82)
RDW: 14 % (ref 11.0–14.6)
WBC: 5.9 10*3/uL (ref 4.0–10.3)
lymph#: 1.2 10*3/uL (ref 0.9–3.3)

## 2014-11-11 LAB — COMPREHENSIVE METABOLIC PANEL (CC13)
ALK PHOS: 72 U/L (ref 40–150)
ALT: <6 U/L (ref 0–55)
AST: 7 U/L (ref 5–34)
Albumin: 3.5 g/dL (ref 3.5–5.0)
Anion Gap: 8 mEq/L (ref 3–11)
BUN: 10.9 mg/dL (ref 7.0–26.0)
CO2: 27 meq/L (ref 22–29)
CREATININE: 0.9 mg/dL (ref 0.7–1.3)
Calcium: 8.9 mg/dL (ref 8.4–10.4)
Chloride: 106 mEq/L (ref 98–109)
EGFR: >90 mL/min/{1.73_m2} (ref >90)
Glucose: 89 mg/dl (ref 70–140)
Potassium: 4.1 mEq/L (ref 3.5–5.1)
SODIUM: 141 meq/L (ref 136–145)
Total Protein: 6.5 g/dL (ref 6.4–8.3)

## 2014-11-11 MED ORDER — IOHEXOL 300 MG/ML  SOLN
80.0000 mL | Freq: Once | INTRAMUSCULAR | Status: AC | PRN
Start: 2014-11-11 — End: 2014-11-11
  Administered 2014-11-11: 80 mL via INTRAVENOUS

## 2014-11-11 MED ORDER — LEVETIRACETAM 1000 MG PO TABS: 1000.0000 mg | ORAL_TABLET | Freq: Two times a day (BID) | ORAL | Status: DC

## 2014-11-11 NOTE — Telephone Encounter (Signed)
Updated Rx has been sent.

## 2014-11-16 ENCOUNTER — Ambulatory Visit: Payer: Medicare Other | Admitting: Internal Medicine

## 2014-11-19 ENCOUNTER — Telehealth: Payer: Self-pay | Admitting: Internal Medicine

## 2014-11-19 NOTE — Telephone Encounter (Signed)
patient called to resched missed appt....patient ok and aware

## 2014-12-04 ENCOUNTER — Telehealth: Payer: Self-pay | Admitting: *Deleted

## 2014-12-04 NOTE — Telephone Encounter (Signed)
Talked with [facility] and rescheduled pt appt to 4/5 at 230 instead for a 100min appt. She verbalized understanding.

## 2014-12-07 ENCOUNTER — Ambulatory Visit: Payer: Medicare Other | Admitting: Neurology

## 2014-12-08 ENCOUNTER — Ambulatory Visit (INDEPENDENT_AMBULATORY_CARE_PROVIDER_SITE_OTHER): Payer: Medicare Other | Admitting: Neurology

## 2014-12-08 ENCOUNTER — Encounter: Payer: Self-pay | Admitting: Neurology

## 2014-12-08 VITALS — BP 120/86 | HR 81 | Temp 97.4°F | Ht 70.0 in | Wt 148.5 lb

## 2014-12-08 DIAGNOSIS — F79 Unspecified intellectual disabilities: Secondary | ICD-10-CM | POA: Diagnosis not present

## 2014-12-08 DIAGNOSIS — F0390 Unspecified dementia without behavioral disturbance: Secondary | ICD-10-CM | POA: Diagnosis not present

## 2014-12-08 DIAGNOSIS — R569 Unspecified convulsions: Secondary | ICD-10-CM

## 2014-12-08 MED ORDER — LEVETIRACETAM 1000 MG PO TABS: 1000.0000 mg | ORAL_TABLET | Freq: Two times a day (BID) | ORAL | Status: DC

## 2014-12-08 NOTE — Patient Instructions (Signed)
Overall you are doing fairly well but I do want to suggest a few things today:   Remember to drink plenty of fluid, eat healthy meals and do not skip any meals. Try to eat protein with a every meal and eat a healthy snack such as fruit or nuts in between meals. Try to keep a regular sleep-wake schedule and try to exercise daily, particularly in the form of walking, 20-30 minutes a day, if you can.   As far as your medications are concerned, I would like to suggest: Continue Keppra 1000mg  twice daily  I would like to see you back in 6 months, sooner if we need to. Please call us with any interim questions, concerns, problems, updates or refill requests.   Please also call us for any test results so we can go over those with you on the phone.  My clinical assistant and will answer any of your questions and relay your messages to me and also relay most of my messages to you.   Our phone number is [phone]. We also have an after hours call service for urgent matters and there is a physician on-call for urgent questions. For any emergencies you know to call 911 or go to the nearest emergency room

## 2014-12-08 NOTE — Progress Notes (Signed)
[facility]    Provider:  Dr Jaynee Eagles Referring Provider: Leamon Arnt, MD Primary Care Physician:  Leamon Arnt, MD  CC:  Seizures  HPI:  Marco [name] is a 64 y.o. male here as a follow up. He is a former patient of dr. Janann Colonel. He has a PMHx of seizures, non-small cell lung cancer s/p chemo and radiation, mental retardation.   Patient is with aid who works in the group home where patient resides. Aid provides all information. Patient is on [place]. It was increased to 1000mg  twice a day in February when he had seizures. Last seizure was a month ago. Feb 3rd. Patient was coming downstairs. Seizures just sits there and looks dazed. Doesn't move at all. He stares for 2 minutes per aid (hospital notes report GTCS as well). The Keppra is working. The last time he had a seizure in February was due to illness. Otherwise the medication has helped. He is alert, mood is good, not irritable. Everything is going well.   Reviewed notes, labs and imaging from outside physicians, which showed:  Patient had new onset seizures in 2014. Baseline MR. Lives in a group home. Has had GTCS and episodes of staring. He was hospitalized in February after GTCS. He was persistently post ictal   MRI of the brain 10/09/2014 (personally reviewed and agree with findings): Edema and low level restricted diffusion in the hippocampus/mesial temporal lobe on the left, most typical of postictal change. The patient could be restudied in the future distant from any seizure event to see if this area has a normalized appearance or any definable permanent underlying abnormality. CBC/CMP unremarkable HgbA1c 5.6  EEG 10/08/2014: IMPRESSION: This is an abnormal electroencephalogram secondary to general  background slowing. This may be seen with a diffuse disturbance  that is etiologically nonspecific, but may include the post-ictal  state, among other possibilities. There were also frequent sharp    transients over the left hemisphere with phase reversal at [facility]  consistent with the patient's history of seizures.     Review of Systems: Patient complains of symptoms per HPI as well as the following symptoms: drooling, fatigue, fever, headache, seizure, weakness, facial drooping. Pertinent negatives per HPI. All others negative.   History   Social History  . Marital Status: Single    Spouse Name: N/A  . Number of Children: 0  . Years of Education: <12   Occupational History  . Not on file.   Social History Main Topics  . Smoking status: Former Smoker -- 0.50 packs/day for 35 years    Types: Cigarettes    Quit date: 09/07/2013  . Smokeless tobacco: Not on file  . Alcohol Use: No  . Drug Use: No  . Sexual Activity: No   Other Topics Concern  . Not on file   Social History Narrative   ** Merged History Encounter **       ** Merged History Encounter **       Patient lives in a group with 3 other brothers.    Patient does not work.   Patient is single.    Patient has some HS education.    Patient has no children.    Patient has a history of psychotic disorder NOS and mental retardation.    Caffeine: Drinks 3 cups coffee per day.     Family History  Problem Relation Age of Onset  . Hypertension Mother   . Hypertension Father     Past Medical History  Diagnosis Date  . Mental disorder     sczizophrenia;moderate retardation  . Depression   . DEMENTIA   . Hypertension     no medications, no documented history per caregiver at preadmission  . GERD (gastroesophageal reflux disease)   . Seizures   . Developmental disability     developmentaly delayed  . Hx of radiation therapy 11/07/13-12/24/13    lung,60Gy/55fx  . Mental retardation   . Seizure disorder   . Lung cancer     Past Surgical History  Procedure Laterality Date  . Cataract extraction w/phaco  07/12/2011    Procedure: CATARACT EXTRACTION PHACO AND INTRAOCULAR LENS PLACEMENT (IOC);  Surgeon:  Adonis Brook;  Location: [facility];  Service: Ophthalmology;  Laterality: Left;  Marland Kitchen Eye surgery      L eye  . Video bronchoscopy Bilateral 10/01/2013    Procedure: VIDEO BRONCHOSCOPY WITHOUT FLUORO;  Surgeon: Collene Gobble, MD;  Location: [facility];  Service: Cardiopulmonary;  Laterality: Bilateral;    Current Outpatient Prescriptions  Medication Sig Dispense Refill  . amoxicillin-clavulanate (AUGMENTIN) 875-125 MG per tablet Take 1 tablet by mouth 2 (two) times daily. 10 tablet 0  . bisacodyl (DULCOLAX) 5 MG EC tablet Take 5 mg by mouth daily as needed for moderate constipation.    Marland Kitchen donepezil (ARICEPT) 10 MG tablet Take 10 mg by mouth.    . fluticasone (FLONASE) 50 MCG/ACT nasal spray Place 2 sprays into both nostrils daily.    Marland Kitchen levETIRAcetam (KEPPRA) 1000 MG tablet Take 1 tablet (1,000 mg total) by mouth 2 (two) times daily. 60 tablet 0  . LORazepam (ATIVAN) 1 MG tablet Take 2 mg by mouth at bedtime.    Marland Kitchen omeprazole (PRILOSEC) 20 MG capsule Take 20 mg by mouth 2 (two) times daily.    . risperiDONE (RISPERDAL) 3 MG tablet Take 3 mg by mouth at bedtime.    . traZODone (DESYREL) 50 MG tablet Take 25 mg by mouth at bedtime. Pt takes 1/2 tablet at bedtime    . buPROPion (WELLBUTRIN XL) 300 MG 24 hr tablet Take 300 mg by mouth daily.  1   No current facility-administered medications for this visit.    Allergies as of 12/08/2014  . (No Known Allergies)    Vitals: BP 120/86 mmHg  Pulse 81  Temp(Src) 97.4 F (36.3 C)  Ht 5\' 10"  (1.778 m)  Wt 148 lb 8 oz (67.359 kg)  BMI 21.31 kg/m2 Last Weight:  Wt Readings from Last 1 Encounters:  12/08/14 148 lb 8 oz (67.359 kg)   Last Height:   Ht Readings from Last 1 Encounters:  12/08/14 5\' 10"  (1.778 m)   Physical exam: Exam: Gen: NAD, conversant, poor dentition                    CV: RRR, no MRG. No Carotid Bruits. No peripheral edema, warm, nontender Eyes: Conjunctivae clear without exudates or hemorrhage  Neuro: Focused Neurologic  Exam  Speech:    Speech is dysarthric, no aphasia Cognition:    The patient is oriented to person, place    Cranial Nerves:    The pupils are equal, round, and reactive to light. The fundi are normal and spontaneous venous pulsations are present. Visual fields are full to finger confrontation. Extraocular movements are intact. Trigeminal sensation is intact and the muscles of mastication are normal. The face is symmetric. The palate elevates in the midline. Hearing intact. Voice is normal. Shoulder shrug is normal. The tongue has normal  motion without fasciculations.  Gait:    Heel-toe and tandem gait are normal.   Motor Observation:    No asymmetry, no atrophy, and no involuntary movements noted. Tone:    Normal muscle tone.    Posture:    Posture is normal. normal erect    Strength:    Strength is V/V in the upper and lower limbs.         Assessment/Plan:   Mr. Mielke is a pleasant 64 year old gentleman with a history of mental retardation, schizophrenia and dementia presenting for follow up evaluation for seizure disorder. Had breakthrough event resulting in status epilepticus and hospital admission in the setting of illness last month. Keppra increased to 1g BID, tolerating well with no further breakthrough seizures. Found to have lung mass, has completed chemo and RT. Will continue keppra at current dose for now. Follow up in 6 months.    Sarina Ill, MD  [facility] [address] [place], [place] [address]  Phone [phone] Fax [phone]  A total of 45 minutes was spent face-to-face with this patient. Over half this time was spent on counseling patient on the seizure diagnosis and different diagnostic and therapeutic options available.

## 2014-12-14 ENCOUNTER — Telehealth: Payer: Self-pay | Admitting: Internal Medicine

## 2014-12-14 ENCOUNTER — Ambulatory Visit (HOSPITAL_BASED_OUTPATIENT_CLINIC_OR_DEPARTMENT_OTHER): Payer: Medicare Other | Admitting: Internal Medicine

## 2014-12-14 ENCOUNTER — Encounter: Payer: Self-pay | Admitting: Internal Medicine

## 2014-12-14 VITALS — BP 135/72 | HR 92 | Temp 98.5°F | Resp 18 | Ht 70.0 in | Wt 151.8 lb

## 2014-12-14 DIAGNOSIS — C3412 Malignant neoplasm of upper lobe, left bronchus or lung: Secondary | ICD-10-CM | POA: Diagnosis present

## 2014-12-14 DIAGNOSIS — C3492 Malignant neoplasm of unspecified part of left bronchus or lung: Secondary | ICD-10-CM

## 2014-12-14 NOTE — Telephone Encounter (Signed)
gave and printed appt sched and avs fo rpt for April and May....Marland Kitchensed added tx.

## 2014-12-14 NOTE — Progress Notes (Signed)
[facility] [phone]   [phone]  OFFICE PROGRESS NOTE  [name],[name], MD [address] [place] [address]  DIAGNOSIS: Stage IIA (T2a., N1, M0) non-small cell lung cancer, squamous cell carcinoma presented with central left upper lobe mass as well as hilar lymphadenopathy diagnosed in January of 2015.  PRIOR THERAPY: Concurrent chemoradiation with weekly carboplatin for AUC of 2 and paclitaxel 45 mg/M2, status post 7 cycles. Last cycle was given 12/22/2013  CURRENT THERAPY: Systemic chemotherapy was carboplatin for AUC of 5 and paclitaxel 175 MG/M2 every 3 weeks.  INTERVAL HISTORY: Marco [name] 64 y.o. male returns to the clinic today for followup visit accompanied by his caregiver. He has been observation for the last 12 months and doing well with no complaints except for dry cough. He denied having any nausea or vomiting, no fever or chills. He denied having any chest pain, shortness of breath, or hemoptysis. He denied having any significant weight loss or night sweats. He has repeat CT scan of the chest performed recently and he is here for evaluation and discussion of his scan results.  MEDICAL HISTORY: Past Medical History  Diagnosis Date  . Mental disorder     sczizophrenia;moderate retardation  . Depression   . DEMENTIA   . Hypertension     no medications, no documented history per caregiver at preadmission  . GERD (gastroesophageal reflux disease)   . Seizures   . Developmental disability     developmentaly delayed  . Hx of radiation therapy 11/07/13-12/24/13    lung,60Gy/57fx  . Mental retardation   . Seizure disorder   . Lung cancer     ALLERGIES:  has No Known Allergies.  MEDICATIONS:  Current Outpatient Prescriptions  Medication Sig Dispense Refill  . bisacodyl (DULCOLAX) 5 MG EC tablet Take 5 mg by mouth daily as needed for moderate constipation.    Marland Kitchen donepezil (ARICEPT) 10 MG tablet Take 10 mg by  mouth.    . fluticasone (FLONASE) 50 MCG/ACT nasal spray Place 2 sprays into both nostrils daily.    Marland Kitchen levETIRAcetam (KEPPRA) 1000 MG tablet Take 1 tablet (1,000 mg total) by mouth 2 (two) times daily. 180 tablet 3  . LORazepam (ATIVAN) 1 MG tablet Take 2 mg by mouth at bedtime.    Marland Kitchen omeprazole (PRILOSEC) 20 MG capsule Take 20 mg by mouth 2 (two) times daily.    . risperiDONE (RISPERDAL) 3 MG tablet Take 3 mg by mouth at bedtime.    . traZODone (DESYREL) 50 MG tablet Take 25 mg by mouth at bedtime. Pt takes 1/2 tablet at bedtime     No current facility-administered medications for this visit.    SURGICAL HISTORY:  Past Surgical History  Procedure Laterality Date  . Cataract extraction w/phaco  07/12/2011    Procedure: CATARACT EXTRACTION PHACO AND INTRAOCULAR LENS PLACEMENT (IOC);  Surgeon: Adonis Brook;  Location: [facility];  Service: Ophthalmology;  Laterality: Left;  Marland Kitchen Eye surgery      L eye  . Video bronchoscopy Bilateral 10/01/2013    Procedure: VIDEO BRONCHOSCOPY WITHOUT FLUORO;  Surgeon: Collene Gobble, MD;  Location: [facility];  Service: Cardiopulmonary;  Laterality: Bilateral;    REVIEW OF SYSTEMS:  Constitutional: negative Eyes: negative Ears, nose, mouth, throat, and face: negative Respiratory: negative Cardiovascular: negative Gastrointestinal: negative Genitourinary:negative Integument/breast: negative Hematologic/lymphatic: negative Musculoskeletal:negative Neurological: negative Behavioral/Psych: negative Endocrine: negative Allergic/Immunologic: negative   PHYSICAL EXAMINATION: General appearance: alert, cooperative and no  distress Head: Normocephalic, without obvious abnormality, atraumatic Neck: no adenopathy, no JVD, supple, symmetrical, trachea midline and thyroid not enlarged, symmetric, no tenderness/mass/nodules Lymph nodes: Cervical, supraclavicular, and axillary nodes normal. Resp: clear to auscultation bilaterally Back: symmetric, no curvature. ROM  normal. No CVA tenderness. Cardio: regular rate and rhythm, S1, S2 normal, no murmur, click, rub or gallop GI: soft, non-tender; bowel sounds normal; no masses,  no organomegaly Extremities: extremities normal, atraumatic, no cyanosis or edema Neurologic: Alert and oriented X 3, normal strength and tone. Normal symmetric reflexes. Normal coordination and gait  ECOG PERFORMANCE STATUS: 1 - Symptomatic but completely ambulatory  Blood pressure 135/72, pulse 92, temperature 98.5 F (36.9 C), temperature source Oral, resp. rate 18, height 5\' 10"  (1.778 m), weight 151 lb 12.8 oz (68.856 kg), SpO2 100 %.  LABORATORY DATA: Lab Results  Component Value Date   WBC 5.9 11/11/2014   HGB 12.7* 11/11/2014   HCT 39.6 11/11/2014   MCV 90.6 11/11/2014   PLT 199 11/11/2014      Chemistry      Component Value Date/Time   NA 141 11/11/2014 1007   NA 147* 10/12/2014 0810   K 4.1 11/11/2014 1007   K 3.6 10/12/2014 0810   CL 110 10/12/2014 0810   CO2 27 11/11/2014 1007   CO2 29 10/12/2014 0810   BUN 10.9 11/11/2014 1007   BUN 17 10/12/2014 0810   CREATININE 0.9 11/11/2014 1007   CREATININE 1.09 10/12/2014 0810      Component Value Date/Time   CALCIUM 8.9 11/11/2014 1007   CALCIUM 8.8 10/12/2014 0810   ALKPHOS 72 11/11/2014 1007   ALKPHOS 70 10/08/2014 0655   AST 7 11/11/2014 1007   AST 27 10/08/2014 0655   ALT <6 11/11/2014 1007   ALT 21 10/08/2014 0655   BILITOT <0.2 11/11/2014 1007   BILITOT 0.5 10/08/2014 0655       RADIOGRAPHIC STUDIES: Ct Chest W Contrast  11/11/2014   CLINICAL DATA:  Subsequent evaluation of 64 year old male with history of lung cancer diagnosed in 2015. Chemotherapy complete. Restaging examination.  EXAM: CT CHEST WITH CONTRAST  TECHNIQUE: Multidetector CT imaging of the chest was performed during intravenous contrast administration.  CONTRAST:  65mL OMNIPAQUE IOHEXOL 300 MG/ML  SOLN  COMPARISON:  Chest CT 08/12/2014.  FINDINGS: Mediastinum/Lymph Nodes: Heart  size is normal. Small amount of pericardial fluid and/or thickening, unlikely to be of any hemodynamic significance at this time. No pathologically enlarged mediastinal or hilar lymph nodes. Diffuse thickening of the esophagus. Small hiatal hernia. No axillary lymphadenopathy.  Lungs/Pleura: Enlarging 5.5 x 2.9 cm left upper lobe mass (image 22 of series 2). Surrounding architectural distortion in the left upper lobe and perihilar aspect of the left lower lobe, related to prior radiation therapy. Small cluster of peribronchovascular ground-glass attenuation micronodularity in the right upper lobe, presumably infectious or inflammatory in etiology. No other suspicious appearing pulmonary nodules or masses are noted. No confluent consolidative airspace disease. No pleural effusions.  Upper Abdomen: 1.9 cm low-attenuation lesion in the lateral aspect of the upper pole of the right kidney is compatible with a simple cyst.  Musculoskeletal/Soft Tissues: There are no aggressive appearing lytic or blastic lesions noted in the visualized portions of the skeleton.  IMPRESSION: 1. Progressive enlargement of left upper lobe mass compatible with locally recurrent lung cancer, with the left upper lobe mass currently measuring 2.9 x 5.5 cm. No mediastinal are hilar lymphadenopathy noted at this time. 2. Small hiatal hernia with extensive circumferential thickening  of the esophageal mucosa, concerning for esophagitis, which could be related to reflux, prior radiation therapy or chemotherapy treatment.   Electronically Signed   By: Vinnie Langton M.D.   On: 11/11/2014 13:55   ASSESSMENT AND PLAN: This is a very pleasant 64 years old [place] American male with unresectable a stage IIA non-small cell lung cancer. He completed a course of concurrent chemoradiation with weekly carboplatin and paclitaxel is status post 7 cycles. Has been observation since his treatment in April 2015.  His recent CT scan of the chest showed further  enlargement of the left upper lobe suprahilar mass concerning for disease recurrence. I discussed the scan results with the patient and his caregiver. I gave him the option of continuous observation versus proceeding with systemic chemotherapy with carboplatin for AUC of 5 and paclitaxel 175 MG/M2 every 3 weeks with Neulasta support. I discussed with the patient adverse effect of the chemotherapy including but not limited to alopecia, myelosuppression, nausea and vomiting, peripheral neuropathy, liver or renal dysfunction. The patient would like to proceed with the chemotherapy. He is expected to start the first cycle of this treatment next week. He would come back for follow-up visit in one month's for reevaluation and management of any adverse effect of his treatment. He was advised to call immediately if he has any concerning symptoms in the interval. The patient voices understanding of current disease status and treatment options and is in agreement with the current care plan.  All questions were answered. The patient knows to call the clinic with any problems, questions or concerns. We can certainly see the patient much sooner if necessary.  Disclaimer: This note was dictated with voice recognition software. Similar sounding words can inadvertently be transcribed and may not be corrected upon review.

## 2014-12-16 ENCOUNTER — Telehealth: Payer: Self-pay | Admitting: Medical Oncology

## 2014-12-16 NOTE — Telephone Encounter (Signed)
I reviewed pplan of care with [facility]. She will come down for some of his treatments

## 2014-12-22 ENCOUNTER — Other Ambulatory Visit (HOSPITAL_BASED_OUTPATIENT_CLINIC_OR_DEPARTMENT_OTHER): Payer: Medicare Other

## 2014-12-22 ENCOUNTER — Other Ambulatory Visit: Payer: Self-pay | Admitting: Internal Medicine

## 2014-12-22 ENCOUNTER — Other Ambulatory Visit: Payer: Self-pay | Admitting: *Deleted

## 2014-12-22 ENCOUNTER — Ambulatory Visit (HOSPITAL_BASED_OUTPATIENT_CLINIC_OR_DEPARTMENT_OTHER): Payer: Medicare Other

## 2014-12-22 ENCOUNTER — Other Ambulatory Visit: Payer: Self-pay

## 2014-12-22 ENCOUNTER — Other Ambulatory Visit: Payer: Self-pay | Admitting: Medical Oncology

## 2014-12-22 VITALS — BP 122/87 | HR 95 | Temp 98.1°F | Resp 18

## 2014-12-22 DIAGNOSIS — Z5111 Encounter for antineoplastic chemotherapy: Secondary | ICD-10-CM

## 2014-12-22 DIAGNOSIS — C3412 Malignant neoplasm of upper lobe, left bronchus or lung: Secondary | ICD-10-CM

## 2014-12-22 DIAGNOSIS — C3492 Malignant neoplasm of unspecified part of left bronchus or lung: Secondary | ICD-10-CM

## 2014-12-22 DIAGNOSIS — C3490 Malignant neoplasm of unspecified part of unspecified bronchus or lung: Secondary | ICD-10-CM

## 2014-12-22 LAB — COMPREHENSIVE METABOLIC PANEL (CC13)
ALT: 7 U/L (ref 0–55)
ANION GAP: 13 meq/L — AB (ref 3–11)
AST: 9 U/L (ref 5–34)
Albumin: 3.9 g/dL (ref 3.5–5.0)
Alkaline Phosphatase: 85 U/L (ref 40–150)
BUN: 13.9 mg/dL (ref 7.0–26.0)
CO2: 22 mEq/L (ref 22–29)
Calcium: 9.1 mg/dL (ref 8.4–10.4)
Chloride: 107 mEq/L (ref 98–109)
Creatinine: 1 mg/dL (ref 0.7–1.3)
EGFR: 88 mL/min/{1.73_m2} — ABNORMAL LOW (ref >90)
Glucose: 108 mg/dl (ref 70–140)
Potassium: 3.5 mEq/L (ref 3.5–5.1)
SODIUM: 142 meq/L (ref 136–145)
Total Bilirubin: 0.29 mg/dL (ref 0.20–1.20)
Total Protein: 7.4 g/dL (ref 6.4–8.3)

## 2014-12-22 LAB — CBC WITH DIFFERENTIAL/PLATELET
BASO%: 0.4 % (ref 0.0–2.0)
Basophils Absolute: 0 10*3/uL (ref 0.0–0.1)
EOS%: 0.4 % (ref 0.0–7.0)
Eosinophils Absolute: 0 10*3/uL (ref 0.0–0.5)
HEMATOCRIT: 44.1 % (ref 38.4–49.9)
HGB: 14.5 g/dL (ref 13.0–17.1)
LYMPH%: 17.5 % (ref 14.0–49.0)
MCH: 29.1 pg (ref 27.2–33.4)
MCHC: 32.9 g/dL (ref 32.0–36.0)
MCV: 88.4 fL (ref 79.3–98.0)
MONO#: 0.6 10*3/uL (ref 0.1–0.9)
MONO%: 7.7 % (ref 0.0–14.0)
NEUT%: 74 % (ref 39.0–75.0)
NEUTROS ABS: 5.3 10*3/uL (ref 1.5–6.5)
NRBC: 0 % (ref 0–0)
Platelets: 184 10*3/uL (ref 140–400)
RBC: 4.99 10*6/uL (ref 4.20–5.82)
RDW: 14 % (ref 11.0–14.6)
WBC: 7.2 10*3/uL (ref 4.0–10.3)
lymph#: 1.3 10*3/uL (ref 0.9–3.3)

## 2014-12-22 MED ORDER — DIPHENHYDRAMINE HCL 50 MG/ML IJ SOLN
INTRAMUSCULAR | Status: AC
  Filled 2014-12-22: qty 1

## 2014-12-22 MED ORDER — PACLITAXEL CHEMO INJECTION 300 MG/50ML
175.0000 mg/m2 | Freq: Once | INTRAVENOUS | Status: AC
  Administered 2014-12-22: 324 mg via INTRAVENOUS
  Filled 2014-12-22: qty 54

## 2014-12-22 MED ORDER — SODIUM CHLORIDE 0.9 % IV SOLN
Freq: Once | INTRAVENOUS | Status: AC
  Administered 2014-12-22: 12:00:00 via INTRAVENOUS

## 2014-12-22 MED ORDER — FAMOTIDINE IN NACL 20-0.9 MG/50ML-% IV SOLN
INTRAVENOUS | Status: AC
  Filled 2014-12-22: qty 50

## 2014-12-22 MED ORDER — SODIUM CHLORIDE 0.9 % IV SOLN
479.0000 mg | Freq: Once | INTRAVENOUS | Status: AC
  Administered 2014-12-22: 480 mg via INTRAVENOUS
  Filled 2014-12-22: qty 48

## 2014-12-22 MED ORDER — PROCHLORPERAZINE MALEATE 10 MG PO TABS: 10.0000 mg | ORAL_TABLET | Freq: Four times a day (QID) | ORAL | Status: DC | PRN

## 2014-12-22 MED ORDER — SODIUM CHLORIDE 0.9 % IV SOLN
Freq: Once | INTRAVENOUS | Status: AC
  Administered 2014-12-22: 13:00:00 via INTRAVENOUS
  Filled 2014-12-22: qty 8

## 2014-12-22 MED ORDER — FAMOTIDINE IN NACL 20-0.9 MG/50ML-% IV SOLN
20.0000 mg | Freq: Once | INTRAVENOUS | Status: AC
  Administered 2014-12-22: 20 mg via INTRAVENOUS

## 2014-12-22 MED ORDER — DIPHENHYDRAMINE HCL 50 MG/ML IJ SOLN
50.0000 mg | Freq: Once | INTRAMUSCULAR | Status: AC
  Administered 2014-12-22: 50 mg via INTRAVENOUS

## 2014-12-22 NOTE — Progress Notes (Signed)
Pt tolerated first time taxol without difficulty. Pt ambulatory with sitter at discharge. Vital signs stable.

## 2014-12-22 NOTE — Patient Instructions (Signed)
[facility] Discharge Instructions for Patients Receiving Chemotherapy  Today you received the following chemotherapy agents TAXOL and CARBOPLATIN  To help prevent nausea and vomiting after your treatment, we encourage you to take your nausea medication TONIGHT AND IN THE MORNING AND THEN EVALUATE HOW HE IS DOING IF HE NEEDS MORE.   MAKE SURE HE DRINKS PLENTY OF WATER TO KEEP HIM HYDRATED.   If you develop nausea and vomiting that is not controlled by your nausea medication, call the clinic.   BELOW ARE SYMPTOMS THAT SHOULD BE REPORTED IMMEDIATELY:  *FEVER GREATER THAN 100.5 F  *CHILLS WITH OR WITHOUT FEVER  NAUSEA AND VOMITING THAT IS NOT CONTROLLED WITH YOUR NAUSEA MEDICATION  *UNUSUAL SHORTNESS OF BREATH  *UNUSUAL BRUISING OR BLEEDING  TENDERNESS IN MOUTH AND THROAT WITH OR WITHOUT PRESENCE OF ULCERS  *URINARY PROBLEMS  *BOWEL PROBLEMS  UNUSUAL RASH Items with * indicate a potential emergency and should be followed up as soon as possible.  Feel free to call the clinic you have any questions or concerns. The clinic phone number is [phone].  Please show the [place] at check-in to the Emergency Department and triage nurse.

## 2014-12-23 ENCOUNTER — Telehealth: Payer: Self-pay | Admitting: *Deleted

## 2014-12-23 NOTE — Telephone Encounter (Signed)
Unable to reach POA (mailbox is full) or sster Shirl- no voice mail avialable.

## 2014-12-23 NOTE — Telephone Encounter (Signed)
-----   Message from Janace Hoard, RN sent at 12/22/2014  2:25 PM EDT ----- Regarding: chemo followup Taxol, carbo. Dr [name], had taxol [place] 1 year ago. Pt is mentally handicapped and you will need to talk to caregiver or brothers.

## 2014-12-24 ENCOUNTER — Ambulatory Visit (HOSPITAL_BASED_OUTPATIENT_CLINIC_OR_DEPARTMENT_OTHER): Payer: Medicare Other

## 2014-12-24 VITALS — BP 131/73 | HR 102 | Temp 98.0°F

## 2014-12-24 DIAGNOSIS — Z5189 Encounter for other specified aftercare: Secondary | ICD-10-CM | POA: Diagnosis not present

## 2014-12-24 DIAGNOSIS — C3492 Malignant neoplasm of unspecified part of left bronchus or lung: Secondary | ICD-10-CM

## 2014-12-24 MED ORDER — PEGFILGRASTIM INJECTION 6 MG/0.6ML ~~LOC~~
6.0000 mg | PREFILLED_SYRINGE | Freq: Once | SUBCUTANEOUS | Status: AC
  Administered 2014-12-24: 6 mg via SUBCUTANEOUS
  Filled 2014-12-24: qty 0.6

## 2014-12-24 NOTE — Patient Instructions (Signed)
Pegfilgrastim injection What is this medicine? PEGFILGRASTIM (peg fil GRA stim) is a long-acting granulocyte colony-stimulating factor that stimulates the growth of neutrophils, a type of white blood cell important in the body's fight against infection. It is used to reduce the incidence of fever and infection in patients with certain types of cancer who are receiving chemotherapy that affects the bone marrow. This medicine may be used for other purposes; ask your health care provider or pharmacist if you have questions. COMMON BRAND NAME(S): Neulasta What should I tell my health care provider before I take this medicine? They need to know if you have any of these conditions: -latex allergy -ongoing radiation therapy -sickle cell disease -skin reactions to acrylic adhesives (On-Body Injector only) -an unusual or allergic reaction to pegfilgrastim, filgrastim, other medicines, foods, dyes, or preservatives -pregnant or trying to get pregnant -breast-feeding How should I use this medicine? This medicine is for injection under the skin. If you get this medicine at home, you will be taught how to prepare and give the pre-filled syringe or how to use the On-body Injector. Refer to the patient Instructions for Use for detailed instructions. Use exactly as directed. Take your medicine at regular intervals. Do not take your medicine more often than directed. It is important that you put your used needles and syringes in a special sharps container. Do not put them in a trash can. If you do not have a sharps container, call your pharmacist or healthcare provider to get one. Talk to your pediatrician regarding the use of this medicine in children. Special care may be needed. Overdosage: If you think you have taken too much of this medicine contact a poison control center or emergency room at once. NOTE: This medicine is only for you. Do not share this medicine with others. What if I miss a dose? It is  important not to miss your dose. Call your doctor or health care professional if you miss your dose. If you miss a dose due to an On-body Injector failure or leakage, a new dose should be administered as soon as possible using a single prefilled syringe for manual use. What may interact with this medicine? Interactions have not been studied. Give your health care provider a list of all the medicines, herbs, non-prescription drugs, or dietary supplements you use. Also tell them if you smoke, drink alcohol, or use illegal drugs. Some items may interact with your medicine. This list may not describe all possible interactions. Give your health care provider a list of all the medicines, herbs, non-prescription drugs, or dietary supplements you use. Also tell them if you smoke, drink alcohol, or use illegal drugs. Some items may interact with your medicine. What should I watch for while using this medicine? You may need blood work done while you are taking this medicine. If you are going to need a MRI, CT scan, or other procedure, tell your doctor that you are using this medicine (On-Body Injector only). What side effects may I notice from receiving this medicine? Side effects that you should report to your doctor or health care professional as soon as possible: -allergic reactions like skin rash, itching or hives, swelling of the face, lips, or tongue -dizziness -fever -pain, redness, or irritation at site where injected -pinpoint red spots on the skin -shortness of breath or breathing problems -stomach or side pain, or pain at the shoulder -swelling -tiredness -trouble passing urine Side effects that usually do not require medical attention (report to your doctor   or health care professional if they continue or are bothersome): -bone pain -muscle pain This list may not describe all possible side effects. Call your doctor for medical advice about side effects. You may report side effects to FDA at  1-800-FDA-1088. Where should I keep my medicine? Keep out of the reach of children. Store pre-filled syringes in a refrigerator between 2 and 8 degrees C (36 and 46 degrees F). Do not freeze. Keep in carton to protect from light. Throw away this medicine if it is left out of the refrigerator for more than 48 hours. Throw away any unused medicine after the expiration date. NOTE: This sheet is a summary. It may not cover all possible information. If you have questions about this medicine, talk to your doctor, pharmacist, or health care provider.  2015, Elsevier/Gold Standard. (2013-11-20 16:14:05)  

## 2014-12-28 ENCOUNTER — Other Ambulatory Visit (HOSPITAL_BASED_OUTPATIENT_CLINIC_OR_DEPARTMENT_OTHER): Payer: Medicare Other

## 2014-12-28 DIAGNOSIS — C3492 Malignant neoplasm of unspecified part of left bronchus or lung: Secondary | ICD-10-CM

## 2014-12-28 LAB — CBC WITH DIFFERENTIAL/PLATELET
BASO%: 0.2 % (ref 0.0–2.0)
BASOS ABS: 0 10*3/uL (ref 0.0–0.1)
EOS ABS: 0 10*3/uL (ref 0.0–0.5)
EOS%: 0.1 % (ref 0.0–7.0)
HEMATOCRIT: 43.7 % (ref 38.4–49.9)
HGB: 14.4 g/dL (ref 13.0–17.1)
LYMPH%: 5.6 % — ABNORMAL LOW (ref 14.0–49.0)
MCH: 29.3 pg (ref 27.2–33.4)
MCHC: 33 g/dL (ref 32.0–36.0)
MCV: 88.8 fL (ref 79.3–98.0)
MONO#: 0.9 10*3/uL (ref 0.1–0.9)
MONO%: 5 % (ref 0.0–14.0)
NEUT#: 16 10*3/uL — ABNORMAL HIGH (ref 1.5–6.5)
NEUT%: 89.1 % — ABNORMAL HIGH (ref 39.0–75.0)
Platelets: 153 10*3/uL (ref 140–400)
RBC: 4.92 10*6/uL (ref 4.20–5.82)
RDW: 14 % (ref 11.0–14.6)
WBC: 17.9 10*3/uL — ABNORMAL HIGH (ref 4.0–10.3)
lymph#: 1 10*3/uL (ref 0.9–3.3)

## 2014-12-28 LAB — COMPREHENSIVE METABOLIC PANEL (CC13)
ALK PHOS: 106 U/L (ref 40–150)
ALT: 17 U/L (ref 0–55)
ANION GAP: 12 meq/L — AB (ref 3–11)
AST: 14 U/L (ref 5–34)
Albumin: 3.9 g/dL (ref 3.5–5.0)
BUN: 20.4 mg/dL (ref 7.0–26.0)
CALCIUM: 9.7 mg/dL (ref 8.4–10.4)
CO2: 24 meq/L (ref 22–29)
Chloride: 104 mEq/L (ref 98–109)
Creatinine: 1.1 mg/dL (ref 0.7–1.3)
EGFR: 83 mL/min/{1.73_m2} — ABNORMAL LOW (ref >90)
Glucose: 92 mg/dl (ref 70–140)
Potassium: 4.3 mEq/L (ref 3.5–5.1)
Sodium: 140 mEq/L (ref 136–145)
TOTAL PROTEIN: 7.4 g/dL (ref 6.4–8.3)
Total Bilirubin: 0.36 mg/dL (ref 0.20–1.20)

## 2015-01-04 ENCOUNTER — Other Ambulatory Visit (HOSPITAL_BASED_OUTPATIENT_CLINIC_OR_DEPARTMENT_OTHER): Payer: Medicare Other

## 2015-01-04 DIAGNOSIS — C3492 Malignant neoplasm of unspecified part of left bronchus or lung: Secondary | ICD-10-CM | POA: Diagnosis present

## 2015-01-04 LAB — COMPREHENSIVE METABOLIC PANEL (CC13)
ALK PHOS: 114 U/L (ref 40–150)
ALT: 15 U/L (ref 0–55)
AST: 11 U/L (ref 5–34)
Albumin: 3.9 g/dL (ref 3.5–5.0)
Anion Gap: 9 mEq/L (ref 3–11)
BUN: 9.8 mg/dL (ref 7.0–26.0)
CALCIUM: 9.4 mg/dL (ref 8.4–10.4)
CHLORIDE: 104 meq/L (ref 98–109)
CO2: 27 mEq/L (ref 22–29)
CREATININE: 1 mg/dL (ref 0.7–1.3)
EGFR: >90 mL/min/{1.73_m2} (ref >90)
GLUCOSE: 92 mg/dL (ref 70–140)
Potassium: 4.2 mEq/L (ref 3.5–5.1)
Sodium: 141 mEq/L (ref 136–145)
Total Bilirubin: 0.24 mg/dL (ref 0.20–1.20)
Total Protein: 7.3 g/dL (ref 6.4–8.3)

## 2015-01-04 LAB — CBC WITH DIFFERENTIAL/PLATELET
BASO%: 0.5 % (ref 0.0–2.0)
Basophils Absolute: 0.1 10*3/uL (ref 0.0–0.1)
EOS%: 0.1 % (ref 0.0–7.0)
Eosinophils Absolute: 0 10*3/uL (ref 0.0–0.5)
HCT: 42.8 % (ref 38.4–49.9)
HEMOGLOBIN: 14 g/dL (ref 13.0–17.1)
LYMPH%: 7.7 % — AB (ref 14.0–49.0)
MCH: 28.2 pg (ref 27.2–33.4)
MCHC: 32.6 g/dL (ref 32.0–36.0)
MCV: 86.3 fL (ref 79.3–98.0)
MONO#: 0.8 10*3/uL (ref 0.1–0.9)
MONO%: 5.7 % (ref 0.0–14.0)
NEUT#: 11.9 10*3/uL — ABNORMAL HIGH (ref 1.5–6.5)
NEUT%: 86 % — ABNORMAL HIGH (ref 39.0–75.0)
PLATELETS: 160 10*3/uL (ref 140–400)
RBC: 4.95 10*6/uL (ref 4.20–5.82)
RDW: 15.1 % — ABNORMAL HIGH (ref 11.0–14.6)
WBC: 13.9 10*3/uL — ABNORMAL HIGH (ref 4.0–10.3)
lymph#: 1.1 10*3/uL (ref 0.9–3.3)

## 2015-01-11 ENCOUNTER — Encounter: Payer: Self-pay | Admitting: Physician Assistant

## 2015-01-11 ENCOUNTER — Telehealth: Payer: Self-pay | Admitting: Physician Assistant

## 2015-01-11 ENCOUNTER — Telehealth: Payer: Self-pay | Admitting: Internal Medicine

## 2015-01-11 ENCOUNTER — Ambulatory Visit (HOSPITAL_BASED_OUTPATIENT_CLINIC_OR_DEPARTMENT_OTHER): Payer: Medicare Other | Admitting: Physician Assistant

## 2015-01-11 ENCOUNTER — Other Ambulatory Visit: Payer: Self-pay | Admitting: Medical Oncology

## 2015-01-11 ENCOUNTER — Ambulatory Visit: Payer: Medicare Other

## 2015-01-11 ENCOUNTER — Other Ambulatory Visit (HOSPITAL_BASED_OUTPATIENT_CLINIC_OR_DEPARTMENT_OTHER): Payer: Medicare Other

## 2015-01-11 VITALS — BP 121/82 | HR 95 | Temp 97.7°F | Resp 18 | Ht 70.0 in | Wt 151.5 lb

## 2015-01-11 DIAGNOSIS — C3412 Malignant neoplasm of upper lobe, left bronchus or lung: Secondary | ICD-10-CM

## 2015-01-11 DIAGNOSIS — C3492 Malignant neoplasm of unspecified part of left bronchus or lung: Secondary | ICD-10-CM

## 2015-01-11 LAB — COMPREHENSIVE METABOLIC PANEL (CC13)
ALBUMIN: 4.2 g/dL (ref 3.5–5.0)
ALT: 12 U/L (ref 0–55)
ANION GAP: 11 meq/L (ref 3–11)
AST: 9 U/L (ref 5–34)
Alkaline Phosphatase: 102 U/L (ref 40–150)
BUN: 14.8 mg/dL (ref 7.0–26.0)
CHLORIDE: 109 meq/L (ref 98–109)
CO2: 25 meq/L (ref 22–29)
CREATININE: 1.1 mg/dL (ref 0.7–1.3)
Calcium: 10.1 mg/dL (ref 8.4–10.4)
EGFR: 84 mL/min/{1.73_m2} — AB (ref >90)
Glucose: 93 mg/dl (ref 70–140)
Potassium: 3.7 mEq/L (ref 3.5–5.1)
Sodium: 145 mEq/L (ref 136–145)
Total Bilirubin: 0.55 mg/dL (ref 0.20–1.20)
Total Protein: 7.9 g/dL (ref 6.4–8.3)

## 2015-01-11 LAB — CBC WITH DIFFERENTIAL/PLATELET
BASO%: 0.5 % (ref 0.0–2.0)
BASOS ABS: 0.1 10*3/uL (ref 0.0–0.1)
EOS ABS: 0 10*3/uL (ref 0.0–0.5)
EOS%: 0.1 % (ref 0.0–7.0)
HEMATOCRIT: 46.5 % (ref 38.4–49.9)
HGB: 15.4 g/dL (ref 13.0–17.1)
LYMPH%: 11.9 % — ABNORMAL LOW (ref 14.0–49.0)
MCH: 29.3 pg (ref 27.2–33.4)
MCHC: 33.1 g/dL (ref 32.0–36.0)
MCV: 88.4 fL (ref 79.3–98.0)
MONO#: 0.8 10*3/uL (ref 0.1–0.9)
MONO%: 6.9 % (ref 0.0–14.0)
NEUT%: 80.6 % — AB (ref 39.0–75.0)
NEUTROS ABS: 9.4 10*3/uL — AB (ref 1.5–6.5)
Platelets: 264 10*3/uL (ref 140–400)
RBC: 5.26 10*6/uL (ref 4.20–5.82)
RDW: 15.1 % — ABNORMAL HIGH (ref 11.0–14.6)
WBC: 11.7 10*3/uL — ABNORMAL HIGH (ref 4.0–10.3)
lymph#: 1.4 10*3/uL (ref 0.9–3.3)

## 2015-01-11 NOTE — Telephone Encounter (Signed)
Pt confirmed labs/ov per 05/09 POF, gave pt AVS and Calendar..... KJ, sent msg to move chemo down

## 2015-01-11 NOTE — Patient Instructions (Signed)
Continue weekly labs as scheduled Follow-up in 3 weeks prior to next scheduled cycle of chemotherapy 

## 2015-01-11 NOTE — Progress Notes (Addendum)
[facility] [phone]   [phone]  OFFICE PROGRESS NOTE  [name],[name], MD [address] [place] [address]  DIAGNOSIS: Recurrent non-small cell lung cancer initially diagnosed as Stage IIA (T2a., N1, M0) non-small cell lung cancer, squamous cell carcinoma presented with central left upper lobe mass as well as hilar lymphadenopathy diagnosed in January of 2015.  PRIOR THERAPY: Concurrent chemoradiation with weekly carboplatin for AUC of 2 and paclitaxel 45 mg/M2, status post 7 cycles. Last cycle was given 12/22/2013  CURRENT THERAPY: Systemic chemotherapy was carboplatin for AUC of 5 and paclitaxel 175 MG/M2 with Neulasta support every 3 weeks. Status post 1 cycle  INTERVAL HISTORY: Marco [name] 64 y.o. male returns to the clinic today for followup visit accompanied by his caregiver. He had been observation for the last 12 months and doing well with no complaints except for dry cough. He denied having any nausea or vomiting, no fever or chills. He denied having any chest pain, shortness of breath, or hemoptysis. He denied having any significant weight loss or night sweats. His recent CT scan revealed evidence for disease progression and he is now being treated systemic chemotherapy in the form of carboplatin for an AUC of 5 and paclitaxel 175 mg/m given every 3 weeks with Neulasta support, status post 1 cycle. Overall he tolerated his first cycle of chemotherapy relatively well with the exception of some fatigue. Today he voices no specific complaints. He presents to proceed with cycle #2.   MEDICAL HISTORY: Past Medical History  Diagnosis Date  . Mental disorder     sczizophrenia;moderate retardation  . Depression   . DEMENTIA   . Hypertension     no medications, no documented history per caregiver at preadmission  . GERD (gastroesophageal reflux disease)   . Seizures   . Developmental disability     developmentaly  delayed  . Hx of radiation therapy 11/07/13-12/24/13    lung,60Gy/61f  . Mental retardation   . Seizure disorder   . Lung cancer     ALLERGIES:  has No Known Allergies.  MEDICATIONS:  Current Outpatient Prescriptions  Medication Sig Dispense Refill  . bisacodyl (DULCOLAX) 5 MG EC tablet Take 5 mg by mouth daily as needed for moderate constipation.    .Marland Kitchendonepezil (ARICEPT) 10 MG tablet Take 10 mg by mouth.    . fluticasone (FLONASE) 50 MCG/ACT nasal spray Place 2 sprays into both nostrils daily.    .Marland KitchenlevETIRAcetam (KEPPRA) 1000 MG tablet Take 1 tablet (1,000 mg total) by mouth 2 (two) times daily. 180 tablet 3  . LORazepam (ATIVAN) 1 MG tablet Take 2 mg by mouth at bedtime.    .Marland Kitchenomeprazole (PRILOSEC) 20 MG capsule Take 20 mg by mouth 2 (two) times daily.    . prochlorperazine (COMPAZINE) 10 MG tablet Take 1 tablet (10 mg total) by mouth every 6 (six) hours as needed for nausea or vomiting. 30 tablet 0  . risperiDONE (RISPERDAL) 3 MG tablet Take 3 mg by mouth at bedtime.    . traZODone (DESYREL) 50 MG tablet Take 25 mg by mouth at bedtime. Pt takes 1/2 tablet at bedtime     No current facility-administered medications for this visit.    SURGICAL HISTORY:  Past Surgical History  Procedure Laterality Date  . Cataract extraction w/phaco  07/12/2011    Procedure: CATARACT EXTRACTION PHACO AND INTRAOCULAR LENS PLACEMENT (IOC);  Surgeon: GAdonis Brook  Location: [facility];  Service:  Ophthalmology;  Laterality: Left;  Marland Kitchen Eye surgery      L eye  . Video bronchoscopy Bilateral 10/01/2013    Procedure: VIDEO BRONCHOSCOPY WITHOUT FLUORO;  Surgeon: Collene Gobble, MD;  Location: [facility];  Service: Cardiopulmonary;  Laterality: Bilateral;    REVIEW OF SYSTEMS:  Constitutional: negative Eyes: negative Ears, nose, mouth, throat, and face: negative Respiratory: negative Cardiovascular: negative Gastrointestinal: negative Genitourinary:negative Integument/breast: negative Hematologic/lymphatic:  negative Musculoskeletal:negative Neurological: negative Behavioral/Psych: negative Endocrine: negative Allergic/Immunologic: negative   PHYSICAL EXAMINATION: General appearance: alert, cooperative and no distress Head: Normocephalic, without obvious abnormality, atraumatic Neck: no adenopathy, no JVD, supple, symmetrical, trachea midline and thyroid not enlarged, symmetric, no tenderness/mass/nodules Lymph nodes: Cervical, supraclavicular, and axillary nodes normal. Resp: clear to auscultation bilaterally Back: symmetric, no curvature. ROM normal. No CVA tenderness. Cardio: regular rate and rhythm, S1, S2 normal, no murmur, click, rub or gallop GI: soft, non-tender; bowel sounds normal; no masses,  no organomegaly Extremities: extremities normal, atraumatic, no cyanosis or edema Neurologic: Alert and oriented X 3, normal strength and tone. Normal symmetric reflexes. Normal coordination and gait  ECOG PERFORMANCE STATUS: 1 - Symptomatic but completely ambulatory  Blood pressure 121/82, pulse 95, temperature 97.7 F (36.5 C), temperature source Oral, resp. rate 18, height '5\' 10"'$  (1.778 m), weight 151 lb 8 oz (68.72 kg), SpO2 100 %.  LABORATORY DATA: Lab Results  Component Value Date   WBC 11.7* 01/11/2015   HGB 15.4 01/11/2015   HCT 46.5 01/11/2015   MCV 88.4 01/11/2015   PLT 264 01/11/2015      Chemistry      Component Value Date/Time   NA 145 01/11/2015 1054   NA 147* 10/12/2014 0810   K 3.7 01/11/2015 1054   K 3.6 10/12/2014 0810   CL 110 10/12/2014 0810   CO2 25 01/11/2015 1054   CO2 29 10/12/2014 0810   BUN 14.8 01/11/2015 1054   BUN 17 10/12/2014 0810   CREATININE 1.1 01/11/2015 1054   CREATININE 1.09 10/12/2014 0810      Component Value Date/Time   CALCIUM 10.1 01/11/2015 1054   CALCIUM 8.8 10/12/2014 0810   ALKPHOS 102 01/11/2015 1054   ALKPHOS 70 10/08/2014 0655   AST 9 01/11/2015 1054   AST 27 10/08/2014 0655   ALT 12 01/11/2015 1054   ALT 21  10/08/2014 0655   BILITOT 0.55 01/11/2015 1054   BILITOT 0.5 10/08/2014 0655       RADIOGRAPHIC STUDIES: Ct Chest W Contrast  11/11/2014   CLINICAL DATA:  Subsequent evaluation of 64 year old male with history of lung cancer diagnosed in 2015. Chemotherapy complete. Restaging examination.  EXAM: CT CHEST WITH CONTRAST  TECHNIQUE: Multidetector CT imaging of the chest was performed during intravenous contrast administration.  CONTRAST:  98m OMNIPAQUE IOHEXOL 300 MG/ML  SOLN  COMPARISON:  Chest CT 08/12/2014.  FINDINGS: Mediastinum/Lymph Nodes: Heart size is normal. Small amount of pericardial fluid and/or thickening, unlikely to be of any hemodynamic significance at this time. No pathologically enlarged mediastinal or hilar lymph nodes. Diffuse thickening of the esophagus. Small hiatal hernia. No axillary lymphadenopathy.  Lungs/Pleura: Enlarging 5.5 x 2.9 cm left upper lobe mass (image 22 of series 2). Surrounding architectural distortion in the left upper lobe and perihilar aspect of the left lower lobe, related to prior radiation therapy. Small cluster of peribronchovascular ground-glass attenuation micronodularity in the right upper lobe, presumably infectious or inflammatory in etiology. No other suspicious appearing pulmonary nodules or masses are noted. No confluent consolidative airspace disease.  No pleural effusions.  Upper Abdomen: 1.9 cm low-attenuation lesion in the lateral aspect of the upper pole of the right kidney is compatible with a simple cyst.  Musculoskeletal/Soft Tissues: There are no aggressive appearing lytic or blastic lesions noted in the visualized portions of the skeleton.  IMPRESSION: 1. Progressive enlargement of left upper lobe mass compatible with locally recurrent lung cancer, with the left upper lobe mass currently measuring 2.9 x 5.5 cm. No mediastinal are hilar lymphadenopathy noted at this time. 2. Small hiatal hernia with extensive circumferential thickening of the  esophageal mucosa, concerning for esophagitis, which could be related to reflux, prior radiation therapy or chemotherapy treatment.   Electronically Signed   By: Vinnie Langton M.D.   On: 11/11/2014 13:55   ASSESSMENT AND PLAN: This is a very pleasant 64 years old [place] American male with unresectable a stage IIA non-small cell lung cancer. He completed a course of concurrent chemoradiation with weekly carboplatin and paclitaxel is status post 7 cycles. Has been observation since his treatment in April 2015.  His recent CT scan of the chest showed further enlargement of the left upper lobe suprahilar mass concerning for disease recurrence. He is currently being treated with systemic chemotherapy in the form of carboplatin for an AUC of 5 and paclitaxel 175 mg/m with Neulasta support given every 3 weeks, status post 1 cycle. Overall he tolerated his chemotherapy relatively well the exception of some fatigue. Patient was discussed with and also seen by Dr. [name]. He will proceed with cycle #2 as scheduled and continue with weekly labs. He will follow-up in 3 weeks prior to the start of cycle #3. He was advised to call immediately if he has any concerning symptoms in the interval. The patient voices understanding of current disease status and treatment options and is in agreement with the current care plan.  All questions were answered. The patient knows to call the clinic with any problems, questions or concerns. We can certainly see the patient much sooner if necessary.  Carlton Adam, PA-C 01/11/2015  ADDENDUM: Hematology/Oncology Attending: I had a face to face encounter with the patient today. I recommended his care plan. This is a very pleasant 64 years old African-American male with recurrent non-small cell lung cancer who is here today for the second cycle of systemic chemotherapy with carboplatin and paclitaxel. The patient tolerated the first cycle of his treatment fairly well with  no significant adverse effects. He has no significant fever or chills. He has no nausea or vomiting. I recommended for the patient to proceed with cycle #2 today as a scheduled. The patient would come back for follow-up visit in 3 weeks with the start of cycle #3. He was advised to call immediately if he has any concerning symptoms in the interval.  Disclaimer: This note was dictated with voice recognition software. Similar sounding words can inadvertently be transcribed and may not be corrected upon review. Eilleen Kempf., MD 01/11/2015

## 2015-01-11 NOTE — Telephone Encounter (Signed)
s.w. staphanie at [phone] living facility.....advised on pt appt

## 2015-01-12 ENCOUNTER — Ambulatory Visit (HOSPITAL_BASED_OUTPATIENT_CLINIC_OR_DEPARTMENT_OTHER): Payer: Medicare Other

## 2015-01-12 VITALS — BP 120/70 | HR 99 | Temp 97.9°F | Resp 18

## 2015-01-12 DIAGNOSIS — C3412 Malignant neoplasm of upper lobe, left bronchus or lung: Secondary | ICD-10-CM | POA: Diagnosis not present

## 2015-01-12 DIAGNOSIS — Z5111 Encounter for antineoplastic chemotherapy: Secondary | ICD-10-CM

## 2015-01-12 DIAGNOSIS — C3492 Malignant neoplasm of unspecified part of left bronchus or lung: Secondary | ICD-10-CM

## 2015-01-12 MED ORDER — SODIUM CHLORIDE 0.9 % IV SOLN
447.0000 mg | Freq: Once | INTRAVENOUS | Status: AC
  Administered 2015-01-12: 450 mg via INTRAVENOUS
  Filled 2015-01-12: qty 45

## 2015-01-12 MED ORDER — SODIUM CHLORIDE 0.9 % IV SOLN
Freq: Once | INTRAVENOUS | Status: AC
  Administered 2015-01-12: 13:00:00 via INTRAVENOUS

## 2015-01-12 MED ORDER — FAMOTIDINE IN NACL 20-0.9 MG/50ML-% IV SOLN
20.0000 mg | Freq: Once | INTRAVENOUS | Status: AC
  Administered 2015-01-12: 20 mg via INTRAVENOUS

## 2015-01-12 MED ORDER — DIPHENHYDRAMINE HCL 50 MG/ML IJ SOLN
50.0000 mg | Freq: Once | INTRAMUSCULAR | Status: AC
  Administered 2015-01-12: 50 mg via INTRAVENOUS

## 2015-01-12 MED ORDER — SODIUM CHLORIDE 0.9 % IV SOLN
Freq: Once | INTRAVENOUS | Status: AC
  Administered 2015-01-12: 14:00:00 via INTRAVENOUS
  Filled 2015-01-12: qty 8

## 2015-01-12 MED ORDER — PACLITAXEL CHEMO INJECTION 300 MG/50ML
175.0000 mg/m2 | Freq: Once | INTRAVENOUS | Status: AC
  Administered 2015-01-12: 324 mg via INTRAVENOUS
  Filled 2015-01-12: qty 54

## 2015-01-12 MED ORDER — DIPHENHYDRAMINE HCL 50 MG/ML IJ SOLN
INTRAMUSCULAR | Status: AC
  Filled 2015-01-12: qty 1

## 2015-01-12 MED ORDER — FAMOTIDINE IN NACL 20-0.9 MG/50ML-% IV SOLN
INTRAVENOUS | Status: AC
  Filled 2015-01-12: qty 50

## 2015-01-12 NOTE — Patient Instructions (Signed)
[facility] Discharge Instructions for Patients Receiving Chemotherapy  Today you received the following chemotherapy agents Taxol and Carboplatin. To help prevent nausea and vomiting after your treatment, we encourage you to take your nausea medication as directed.  If you develop nausea and vomiting that is not controlled by your nausea medication, call the clinic.   BELOW ARE SYMPTOMS THAT SHOULD BE REPORTED IMMEDIATELY:  *FEVER GREATER THAN 100.5 F  *CHILLS WITH OR WITHOUT FEVER  NAUSEA AND VOMITING THAT IS NOT CONTROLLED WITH YOUR NAUSEA MEDICATION  *UNUSUAL SHORTNESS OF BREATH  *UNUSUAL BRUISING OR BLEEDING  TENDERNESS IN MOUTH AND THROAT WITH OR WITHOUT PRESENCE OF ULCERS  *URINARY PROBLEMS  *BOWEL PROBLEMS  UNUSUAL RASH Items with * indicate a potential emergency and should be followed up as soon as possible.  Feel free to call the clinic you have any questions or concerns. The clinic phone number is [phone].  Please show the CHEMO ALERT CARD at check-in to the Emergency Department and triage nurse.    

## 2015-01-13 ENCOUNTER — Ambulatory Visit: Payer: Medicare Other

## 2015-01-14 ENCOUNTER — Ambulatory Visit (HOSPITAL_BASED_OUTPATIENT_CLINIC_OR_DEPARTMENT_OTHER): Payer: Medicare Other

## 2015-01-14 VITALS — BP 108/84 | HR 104 | Temp 98.3°F

## 2015-01-14 DIAGNOSIS — Z5189 Encounter for other specified aftercare: Secondary | ICD-10-CM

## 2015-01-14 DIAGNOSIS — C3492 Malignant neoplasm of unspecified part of left bronchus or lung: Secondary | ICD-10-CM

## 2015-01-14 MED ORDER — PEGFILGRASTIM INJECTION 6 MG/0.6ML ~~LOC~~
6.0000 mg | PREFILLED_SYRINGE | Freq: Once | SUBCUTANEOUS | Status: AC
  Administered 2015-01-14: 6 mg via SUBCUTANEOUS
  Filled 2015-01-14: qty 0.6

## 2015-01-18 ENCOUNTER — Other Ambulatory Visit (HOSPITAL_BASED_OUTPATIENT_CLINIC_OR_DEPARTMENT_OTHER): Payer: Medicare Other

## 2015-01-18 DIAGNOSIS — C3492 Malignant neoplasm of unspecified part of left bronchus or lung: Secondary | ICD-10-CM | POA: Diagnosis not present

## 2015-01-18 LAB — CBC WITH DIFFERENTIAL/PLATELET
BASO%: 0.9 % (ref 0.0–2.0)
BASOS ABS: 0.2 10*3/uL — AB (ref 0.0–0.1)
EOS%: 0.2 % (ref 0.0–7.0)
Eosinophils Absolute: 0 10*3/uL (ref 0.0–0.5)
HCT: 40.5 % (ref 38.4–49.9)
HGB: 13.1 g/dL (ref 13.0–17.1)
LYMPH%: 5.7 % — AB (ref 14.0–49.0)
MCH: 28.1 pg (ref 27.2–33.4)
MCHC: 32.3 g/dL (ref 32.0–36.0)
MCV: 87 fL (ref 79.3–98.0)
MONO#: 0 10*3/uL — AB (ref 0.1–0.9)
MONO%: 0.2 % (ref 0.0–14.0)
NEUT#: 17.3 10*3/uL — ABNORMAL HIGH (ref 1.5–6.5)
NEUT%: 93 % — ABNORMAL HIGH (ref 39.0–75.0)
Platelets: 186 10*3/uL (ref 140–400)
RBC: 4.66 10*6/uL (ref 4.20–5.82)
RDW: 15.5 % — ABNORMAL HIGH (ref 11.0–14.6)
WBC: 18.6 10*3/uL — AB (ref 4.0–10.3)
lymph#: 1.1 10*3/uL (ref 0.9–3.3)

## 2015-01-18 LAB — COMPREHENSIVE METABOLIC PANEL (CC13)
ALBUMIN: 3.8 g/dL (ref 3.5–5.0)
ALT: 19 U/L (ref 0–55)
AST: 13 U/L (ref 5–34)
Alkaline Phosphatase: 131 U/L (ref 40–150)
Anion Gap: 12 mEq/L — ABNORMAL HIGH (ref 3–11)
BILIRUBIN TOTAL: 0.38 mg/dL (ref 0.20–1.20)
BUN: 11.8 mg/dL (ref 7.0–26.0)
CALCIUM: 9 mg/dL (ref 8.4–10.4)
CHLORIDE: 104 meq/L (ref 98–109)
CO2: 26 mEq/L (ref 22–29)
Creatinine: 1 mg/dL (ref 0.7–1.3)
EGFR: 89 mL/min/{1.73_m2} — ABNORMAL LOW (ref >90)
GLUCOSE: 101 mg/dL (ref 70–140)
POTASSIUM: 3.8 meq/L (ref 3.5–5.1)
Sodium: 141 mEq/L (ref 136–145)
TOTAL PROTEIN: 7.1 g/dL (ref 6.4–8.3)

## 2015-01-25 ENCOUNTER — Other Ambulatory Visit (HOSPITAL_BASED_OUTPATIENT_CLINIC_OR_DEPARTMENT_OTHER): Payer: Medicare Other

## 2015-01-25 DIAGNOSIS — C3492 Malignant neoplasm of unspecified part of left bronchus or lung: Secondary | ICD-10-CM

## 2015-01-25 LAB — CBC WITH DIFFERENTIAL/PLATELET
BASO%: 0.4 % (ref 0.0–2.0)
BASOS ABS: 0.1 10*3/uL (ref 0.0–0.1)
EOS ABS: 0 10*3/uL (ref 0.0–0.5)
EOS%: 0 % (ref 0.0–7.0)
HEMATOCRIT: 43 % (ref 38.4–49.9)
HGB: 14.2 g/dL (ref 13.0–17.1)
LYMPH%: 7.2 % — ABNORMAL LOW (ref 14.0–49.0)
MCH: 28.6 pg (ref 27.2–33.4)
MCHC: 33.1 g/dL (ref 32.0–36.0)
MCV: 86.4 fL (ref 79.3–98.0)
MONO#: 0.8 10*3/uL (ref 0.1–0.9)
MONO%: 5.2 % (ref 0.0–14.0)
NEUT%: 87.2 % — ABNORMAL HIGH (ref 39.0–75.0)
NEUTROS ABS: 12.8 10*3/uL — AB (ref 1.5–6.5)
Platelets: 156 10*3/uL (ref 140–400)
RBC: 4.97 10*6/uL (ref 4.20–5.82)
RDW: 16 % — ABNORMAL HIGH (ref 11.0–14.6)
WBC: 14.7 10*3/uL — ABNORMAL HIGH (ref 4.0–10.3)
lymph#: 1.1 10*3/uL (ref 0.9–3.3)

## 2015-01-25 LAB — COMPREHENSIVE METABOLIC PANEL (CC13)
ALT: 19 U/L (ref 0–55)
ANION GAP: 15 meq/L — AB (ref 3–11)
AST: 14 U/L (ref 5–34)
Albumin: 4.2 g/dL (ref 3.5–5.0)
Alkaline Phosphatase: 114 U/L (ref 40–150)
BUN: 14.8 mg/dL (ref 7.0–26.0)
CALCIUM: 9.7 mg/dL (ref 8.4–10.4)
CO2: 25 meq/L (ref 22–29)
CREATININE: 1 mg/dL (ref 0.7–1.3)
Chloride: 106 mEq/L (ref 98–109)
EGFR: >90 mL/min/{1.73_m2} (ref >90)
GLUCOSE: 97 mg/dL (ref 70–140)
POTASSIUM: 3.7 meq/L (ref 3.5–5.1)
SODIUM: 145 meq/L (ref 136–145)
Total Bilirubin: 0.64 mg/dL (ref 0.20–1.20)
Total Protein: 7.9 g/dL (ref 6.4–8.3)

## 2015-02-02 ENCOUNTER — Ambulatory Visit (HOSPITAL_BASED_OUTPATIENT_CLINIC_OR_DEPARTMENT_OTHER): Payer: Medicare Other | Admitting: Physician Assistant

## 2015-02-02 ENCOUNTER — Encounter: Payer: Self-pay | Admitting: Physician Assistant

## 2015-02-02 ENCOUNTER — Ambulatory Visit (HOSPITAL_BASED_OUTPATIENT_CLINIC_OR_DEPARTMENT_OTHER): Payer: Medicare Other

## 2015-02-02 ENCOUNTER — Other Ambulatory Visit (HOSPITAL_BASED_OUTPATIENT_CLINIC_OR_DEPARTMENT_OTHER): Payer: Medicare Other

## 2015-02-02 VITALS — BP 120/78 | HR 111 | Temp 97.6°F | Resp 20 | Ht 70.0 in | Wt 153.8 lb

## 2015-02-02 DIAGNOSIS — Z5111 Encounter for antineoplastic chemotherapy: Secondary | ICD-10-CM | POA: Diagnosis present

## 2015-02-02 DIAGNOSIS — C3490 Malignant neoplasm of unspecified part of unspecified bronchus or lung: Secondary | ICD-10-CM

## 2015-02-02 DIAGNOSIS — C3412 Malignant neoplasm of upper lobe, left bronchus or lung: Secondary | ICD-10-CM

## 2015-02-02 DIAGNOSIS — C3492 Malignant neoplasm of unspecified part of left bronchus or lung: Secondary | ICD-10-CM

## 2015-02-02 LAB — CBC WITH DIFFERENTIAL/PLATELET
BASO%: 1 % (ref 0.0–2.0)
BASOS ABS: 0.1 10*3/uL (ref 0.0–0.1)
EOS%: 0.3 % (ref 0.0–7.0)
Eosinophils Absolute: 0 10*3/uL (ref 0.0–0.5)
HCT: 40 % (ref 38.4–49.9)
HGB: 13.2 g/dL (ref 13.0–17.1)
LYMPH#: 1.1 10*3/uL (ref 0.9–3.3)
LYMPH%: 10.8 % — ABNORMAL LOW (ref 14.0–49.0)
MCH: 29.2 pg (ref 27.2–33.4)
MCHC: 33.1 g/dL (ref 32.0–36.0)
MCV: 88.2 fL (ref 79.3–98.0)
MONO#: 1 10*3/uL — AB (ref 0.1–0.9)
MONO%: 10.4 % (ref 0.0–14.0)
NEUT#: 7.8 10*3/uL — ABNORMAL HIGH (ref 1.5–6.5)
NEUT%: 77.5 % — ABNORMAL HIGH (ref 39.0–75.0)
Platelets: 225 10*3/uL (ref 140–400)
RBC: 4.54 10*6/uL (ref 4.20–5.82)
RDW: 16.7 % — AB (ref 11.0–14.6)
WBC: 10 10*3/uL (ref 4.0–10.3)

## 2015-02-02 LAB — COMPREHENSIVE METABOLIC PANEL (CC13)
ALBUMIN: 3.8 g/dL (ref 3.5–5.0)
ALT: 6 U/L (ref 0–55)
ANION GAP: 10 meq/L (ref 3–11)
AST: 8 U/L (ref 5–34)
Alkaline Phosphatase: 87 U/L (ref 40–150)
BUN: 13.2 mg/dL (ref 7.0–26.0)
CALCIUM: 9.3 mg/dL (ref 8.4–10.4)
CHLORIDE: 106 meq/L (ref 98–109)
CO2: 26 meq/L (ref 22–29)
Creatinine: 1 mg/dL (ref 0.7–1.3)
EGFR: >90 mL/min/{1.73_m2} (ref >90)
Glucose: 96 mg/dl (ref 70–140)
Potassium: 4 mEq/L (ref 3.5–5.1)
Sodium: 142 mEq/L (ref 136–145)
Total Bilirubin: 0.44 mg/dL (ref 0.20–1.20)
Total Protein: 7.2 g/dL (ref 6.4–8.3)

## 2015-02-02 MED ORDER — DIPHENHYDRAMINE HCL 50 MG/ML IJ SOLN
INTRAMUSCULAR | Status: AC
  Filled 2015-02-02: qty 1

## 2015-02-02 MED ORDER — SODIUM CHLORIDE 0.9 % IV SOLN
Freq: Once | INTRAVENOUS | Status: AC
  Administered 2015-02-02: 13:00:00 via INTRAVENOUS
  Filled 2015-02-02: qty 8

## 2015-02-02 MED ORDER — FAMOTIDINE IN NACL 20-0.9 MG/50ML-% IV SOLN
INTRAVENOUS | Status: AC
  Filled 2015-02-02: qty 50

## 2015-02-02 MED ORDER — SODIUM CHLORIDE 0.9 % IV SOLN
479.0000 mg | Freq: Once | INTRAVENOUS | Status: AC
  Administered 2015-02-02: 480 mg via INTRAVENOUS
  Filled 2015-02-02: qty 48

## 2015-02-02 MED ORDER — FAMOTIDINE IN NACL 20-0.9 MG/50ML-% IV SOLN
20.0000 mg | Freq: Once | INTRAVENOUS | Status: AC
  Administered 2015-02-02: 20 mg via INTRAVENOUS

## 2015-02-02 MED ORDER — DIPHENHYDRAMINE HCL 50 MG/ML IJ SOLN
50.0000 mg | Freq: Once | INTRAMUSCULAR | Status: AC
Start: 2015-02-02 — End: 2015-02-02
  Administered 2015-02-02: 50 mg via INTRAVENOUS

## 2015-02-02 MED ORDER — SODIUM CHLORIDE 0.9 % IV SOLN
Freq: Once | INTRAVENOUS | Status: AC
  Administered 2015-02-02: 12:00:00 via INTRAVENOUS

## 2015-02-02 MED ORDER — PACLITAXEL CHEMO INJECTION 300 MG/50ML
175.0000 mg/m2 | Freq: Once | INTRAVENOUS | Status: AC
  Administered 2015-02-02: 324 mg via INTRAVENOUS
  Filled 2015-02-02: qty 54

## 2015-02-02 NOTE — Progress Notes (Addendum)
[facility] Telephone:(336) [phone]   Fax:(336) [phone]  OFFICE PROGRESS NOTE  Marco,[name], MD [address] [place] [address]  DIAGNOSIS: Recurrent non-small cell lung cancer initially diagnosed as Stage IIA (T2a., N1, M0) non-small cell lung cancer, squamous cell carcinoma presented with central left upper lobe mass as well as hilar lymphadenopathy diagnosed in January of 2015.  PRIOR THERAPY: Concurrent chemoradiation with weekly carboplatin for AUC of 2 and paclitaxel 45 mg/M2, status post 7 cycles. Last cycle was given 12/22/2013  CURRENT THERAPY: Systemic chemotherapy with carboplatin for AUC of 5 and paclitaxel 175 MG/M2 with Neulasta support every 3 weeks. Status post 2 cycles  INTERVAL HISTORY: Marco [name] 64 y.o. male returns to the clinic today for followup visit accompanied by his caregiver. He is currently being treated with systemic chemotherapy in the form of carboplatin for an AUC of 5 and paclitaxel 175 mg/m given every 3 weeks with Neulasta support, status post 2 cycles. Overall he is tolerating his chemotherapy relatively well with the exception of some fatigue. Today he voices no specific complaints. He presents to proceed with cycle #3.   MEDICAL HISTORY: Past Medical History  Diagnosis Date  . Mental disorder     sczizophrenia;moderate retardation  . Depression   . DEMENTIA   . Hypertension     no medications, no documented history per caregiver at preadmission  . GERD (gastroesophageal reflux disease)   . Seizures   . Developmental disability     developmentaly delayed  . Hx of radiation therapy 11/07/13-12/24/13    lung,60Gy/87f  . Mental retardation   . Seizure disorder   . Lung cancer     ALLERGIES:  has No Known Allergies.  MEDICATIONS:  Current Outpatient Prescriptions  Medication Sig Dispense Refill  . bisacodyl (DULCOLAX) 5 MG EC tablet Take 5 mg by mouth daily as needed for moderate constipation.     .Marland Kitchendonepezil (ARICEPT) 10 MG tablet Take 10 mg by mouth.    . fluticasone (FLONASE) 50 MCG/ACT nasal spray Place 2 sprays into both nostrils daily.    .Marland KitchenlevETIRAcetam (KEPPRA) 1000 MG tablet Take 1 tablet (1,000 mg total) by mouth 2 (two) times daily. 180 tablet 3  . LORazepam (ATIVAN) 1 MG tablet Take 2 mg by mouth at bedtime.    .Marland Kitchenomeprazole (PRILOSEC) 20 MG capsule Take 20 mg by mouth 2 (two) times daily.    . prochlorperazine (COMPAZINE) 10 MG tablet Take 1 tablet (10 mg total) by mouth every 6 (six) hours as needed for nausea or vomiting. 30 tablet 0  . risperiDONE (RISPERDAL) 3 MG tablet Take 3 mg by mouth at bedtime.    . traZODone (DESYREL) 50 MG tablet Take 25 mg by mouth at bedtime. Pt takes 1/2 tablet at bedtime     No current facility-administered medications for this visit.   Facility-Administered Medications Ordered in Other Visits  Medication Dose Route Frequency Provider Last Rate Last Dose  . CARBOplatin (PARAPLATIN) 480 mg in sodium chloride 0.9 % 250 mL chemo infusion  480 mg Intravenous Once MCurt Bears MD      . PACLitaxel (TAXOL) 324 mg in dextrose 5 % 500 mL chemo infusion (> '80mg'$ /m2)  175 mg/m2 (Treatment Plan Actual) Intravenous Once MCurt Bears MD 185 mL/hr at 02/02/15 1430 324 mg at 02/02/15 1430    SURGICAL HISTORY:  Past Surgical History  Procedure Laterality Date  . Cataract extraction w/phaco  07/12/2011  Procedure: CATARACT EXTRACTION PHACO AND INTRAOCULAR LENS PLACEMENT (IOC);  Surgeon: Adonis Brook;  Location: [facility];  Service: Ophthalmology;  Laterality: Left;  Marland Kitchen Eye surgery      [name] eye  . Video bronchoscopy Bilateral 10/01/2013    Procedure: VIDEO BRONCHOSCOPY WITHOUT FLUORO;  Surgeon: Collene Gobble, MD;  Location: [facility];  Service: Cardiopulmonary;  Laterality: Bilateral;    REVIEW OF SYSTEMS:  Constitutional: positive for fatigue Eyes: negative Ears, nose, mouth, throat, and face: negative Respiratory: negative Cardiovascular:  negative Gastrointestinal: negative Genitourinary:negative Integument/breast: negative Hematologic/lymphatic: negative Musculoskeletal:negative Neurological: negative Behavioral/Psych: negative Endocrine: negative Allergic/Immunologic: negative   PHYSICAL EXAMINATION: General appearance: alert, cooperative and no distress Head: Normocephalic, without obvious abnormality, atraumatic Neck: no adenopathy, no JVD, supple, symmetrical, trachea midline and thyroid not enlarged, symmetric, no tenderness/mass/nodules Lymph nodes: Cervical, supraclavicular, and axillary nodes normal. Resp: clear to auscultation bilaterally Back: symmetric, no curvature. ROM normal. No CVA tenderness. Cardio: regular rate and rhythm, S1, S2 normal, no murmur, click, rub or gallop GI: soft, non-tender; bowel sounds normal; no masses,  no organomegaly Extremities: extremities normal, atraumatic, no cyanosis or edema Neurologic: Alert and oriented X 3, normal strength and tone. Normal symmetric reflexes. Normal coordination and gait  ECOG PERFORMANCE STATUS: 1 - Symptomatic but completely ambulatory  Blood pressure 120/78, pulse 111, temperature 97.6 F (36.4 C), temperature source Oral, resp. rate 20, height '5\' 10"'$  (1.778 m), weight 153 lb 12.8 oz (69.763 kg), SpO2 97 %.  LABORATORY DATA: Lab Results  Component Value Date   WBC 10.0 02/02/2015   HGB 13.2 02/02/2015   HCT 40.0 02/02/2015   MCV 88.2 02/02/2015   PLT 225 02/02/2015      Chemistry      Component Value Date/Time   NA 142 02/02/2015 1034   NA 147* 10/12/2014 0810   K 4.0 02/02/2015 1034   K 3.6 10/12/2014 0810   CL 110 10/12/2014 0810   CO2 26 02/02/2015 1034   CO2 29 10/12/2014 0810   BUN 13.2 02/02/2015 1034   BUN 17 10/12/2014 0810   CREATININE 1.0 02/02/2015 1034   CREATININE 1.09 10/12/2014 0810      Component Value Date/Time   CALCIUM 9.3 02/02/2015 1034   CALCIUM 8.8 10/12/2014 0810   ALKPHOS 87 02/02/2015 1034   ALKPHOS  70 10/08/2014 0655   AST 8 02/02/2015 1034   AST 27 10/08/2014 0655   ALT 6 02/02/2015 1034   ALT 21 10/08/2014 0655   BILITOT 0.44 02/02/2015 1034   BILITOT 0.5 10/08/2014 0655       RADIOGRAPHIC STUDIES: Ct Chest W Contrast  11/11/2014   CLINICAL DATA:  Subsequent evaluation of 64 year old male with history of lung cancer diagnosed in 2015. Chemotherapy complete. Restaging examination.  EXAM: CT CHEST WITH CONTRAST  TECHNIQUE: Multidetector CT imaging of the chest was performed during intravenous contrast administration.  CONTRAST:  6m OMNIPAQUE IOHEXOL 300 MG/ML  SOLN  COMPARISON:  Chest CT 08/12/2014.  FINDINGS: Mediastinum/Lymph Nodes: Heart size is normal. Small amount of pericardial fluid and/or thickening, unlikely to be of any hemodynamic significance at this time. No pathologically enlarged mediastinal or hilar lymph nodes. Diffuse thickening of the esophagus. Small hiatal hernia. No axillary lymphadenopathy.  Lungs/Pleura: Enlarging 5.5 x 2.9 cm left upper lobe mass (image 22 of series 2). Surrounding architectural distortion in the left upper lobe and perihilar aspect of the left lower lobe, related to prior radiation therapy. Small cluster of peribronchovascular ground-glass attenuation micronodularity in the right upper lobe,  presumably infectious or inflammatory in etiology. No other suspicious appearing pulmonary nodules or masses are noted. No confluent consolidative airspace disease. No pleural effusions.  Upper Abdomen: 1.9 cm low-attenuation lesion in the lateral aspect of the upper pole of the right kidney is compatible with a simple cyst.  Musculoskeletal/Soft Tissues: There are no aggressive appearing lytic or blastic lesions noted in the visualized portions of the skeleton.  IMPRESSION: 1. Progressive enlargement of left upper lobe mass compatible with locally recurrent lung cancer, with the left upper lobe mass currently measuring 2.9 x 5.5 cm. No mediastinal are hilar  lymphadenopathy noted at this time. 2. Small hiatal hernia with extensive circumferential thickening of the esophageal mucosa, concerning for esophagitis, which could be related to reflux, prior radiation therapy or chemotherapy treatment.   Electronically Signed   By: Vinnie Langton M.D.   On: 11/11/2014 13:55   ASSESSMENT AND PLAN: This is a very pleasant 64 years old [place] American male with unresectable a stage IIA non-small cell lung cancer. He completed a course of concurrent chemoradiation with weekly carboplatin and paclitaxel is status post 7 cycles. Has been observation since his treatment in April 2015.  His  CT scan of the chest showed further enlargement of the left upper lobe suprahilar mass concerning for disease recurrence. He is currently being treated with systemic chemotherapy in the form of carboplatin for an AUC of 5 and paclitaxel 175 mg/m with Neulasta support given every 3 weeks, status post 2 cycles. Overall he is tolerating his chemotherapy relatively well the exception of some fatigue. Patient was discussed with and also seen by Dr. [name]. He will proceed with cycle #3 as scheduled and continue with weekly labs. He will follow-up in 3 weeks prior to the start of cycle #4 with a restaging CT scan of the chest with contrast to reevaluate his disease. He was advised to call immediately if he has any concerning symptoms in the interval. The patient voices understanding of current disease status and treatment options and is in agreement with the current care plan.  All questions were answered. The patient knows to call the clinic with any problems, questions or concerns. We can certainly see the patient much sooner if necessary.  Carlton Adam, PA-C 02/02/2015   ADDENDUM: Hematology/Oncology Attending: I had a face to face encounter with the patient. I recommended his care plan. This is a very pleasant 64 years old African-American male with recurrent non-small cell  lung cancer who is currently undergoing treatment with systemic chemotherapy with carboplatin and paclitaxel is status post 2 cycles. The patient is tolerating his treatment well except for mild fatigue. I recommended for him to proceed with cycle #3 today as a scheduled. He would come back for follow-up visit in 3 weeks for reevaluation after repeating CT scan of the chest for restaging of his disease. The patient was advised to call immediately if he has any concerning symptoms in the interval.   Disclaimer: This note was dictated with voice recognition software. Similar sounding words can inadvertently be transcribed and may be missed upon review. Eilleen Kempf., MD 02/08/2015

## 2015-02-02 NOTE — Patient Instructions (Signed)
[facility] Discharge Instructions for Patients Receiving Chemotherapy  Today you received the following chemotherapy agents Taxol and Carboplatin.  To help prevent nausea and vomiting after your treatment, we encourage you to take your nausea medication {CH   If you develop nausea and vomiting that is not controlled by your nausea medication, call the clinic.   BELOW ARE SYMPTOMS THAT SHOULD BE REPORTED IMMEDIATELY:  *FEVER GREATER THAN 100.5 F  *CHILLS WITH OR WITHOUT FEVER  NAUSEA AND VOMITING THAT IS NOT CONTROLLED WITH YOUR NAUSEA MEDICATION  *UNUSUAL SHORTNESS OF BREATH  *UNUSUAL BRUISING OR BLEEDING  TENDERNESS IN MOUTH AND THROAT WITH OR WITHOUT PRESENCE OF ULCERS  *URINARY PROBLEMS  *BOWEL PROBLEMS  UNUSUAL RASH Items with * indicate a potential emergency and should be followed up as soon as possible.  Feel free to call the clinic you have any questions or concerns. The clinic phone number is [phone].  Please show the [facility] at check-in to the Emergency Department and triage nurse.

## 2015-02-03 ENCOUNTER — Telehealth: Payer: Self-pay | Admitting: *Deleted

## 2015-02-03 NOTE — Patient Instructions (Signed)
Continue with weekly left as scheduled Follow-up in 3 weeks prior to the start of your next scheduled cycle of chemotherapy with a restaging CT scan of your chest to reevaluate your disease

## 2015-02-03 NOTE — Telephone Encounter (Signed)
Per staff message and POF I have scheduled appts. Advised scheduler of appts. JMW  

## 2015-02-04 ENCOUNTER — Ambulatory Visit (HOSPITAL_BASED_OUTPATIENT_CLINIC_OR_DEPARTMENT_OTHER): Payer: Medicare Other

## 2015-02-04 ENCOUNTER — Telehealth: Payer: Self-pay | Admitting: Internal Medicine

## 2015-02-04 VITALS — BP 137/78 | HR 112 | Temp 99.1°F

## 2015-02-04 DIAGNOSIS — C3492 Malignant neoplasm of unspecified part of left bronchus or lung: Secondary | ICD-10-CM

## 2015-02-04 DIAGNOSIS — C3412 Malignant neoplasm of upper lobe, left bronchus or lung: Secondary | ICD-10-CM

## 2015-02-04 DIAGNOSIS — Z5189 Encounter for other specified aftercare: Secondary | ICD-10-CM

## 2015-02-04 MED ORDER — PEGFILGRASTIM INJECTION 6 MG/0.6ML ~~LOC~~
6.0000 mg | PREFILLED_SYRINGE | Freq: Once | SUBCUTANEOUS | Status: AC
  Administered 2015-02-04: 6 mg via SUBCUTANEOUS
  Filled 2015-02-04: qty 0.6

## 2015-02-04 NOTE — Telephone Encounter (Signed)
Pt to be given updated schedule at next visit for inj, labs/ov/chemo/inj added for next cycle per 05/31 POF, ok per MD one day off from treatment plan.... KJ

## 2015-02-19 ENCOUNTER — Ambulatory Visit (HOSPITAL_COMMUNITY)
Admission: RE | Admit: 2015-02-19 | Discharge: 2015-02-19 | Disposition: A | Discharge status: Home / Self Care | Payer: Medicare Other | Source: Ambulatory Visit | Attending: Physician Assistant | Admitting: Physician Assistant

## 2015-02-19 DIAGNOSIS — R918 Other nonspecific abnormal finding of lung field: Secondary | ICD-10-CM | POA: Insufficient documentation

## 2015-02-19 DIAGNOSIS — C3490 Malignant neoplasm of unspecified part of unspecified bronchus or lung: Secondary | ICD-10-CM

## 2015-02-19 MED ORDER — IOHEXOL 300 MG/ML  SOLN
80.0000 mL | Freq: Once | INTRAMUSCULAR | Status: AC | PRN
  Administered 2015-02-19: 80 mL via INTRAVENOUS

## 2015-02-23 ENCOUNTER — Ambulatory Visit: Payer: Medicaid Other

## 2015-02-23 ENCOUNTER — Other Ambulatory Visit (HOSPITAL_BASED_OUTPATIENT_CLINIC_OR_DEPARTMENT_OTHER): Payer: Medicare Other

## 2015-02-23 ENCOUNTER — Telehealth: Payer: Self-pay | Admitting: Physician Assistant

## 2015-02-23 ENCOUNTER — Encounter: Payer: Self-pay | Admitting: Physician Assistant

## 2015-02-23 ENCOUNTER — Ambulatory Visit (HOSPITAL_BASED_OUTPATIENT_CLINIC_OR_DEPARTMENT_OTHER): Payer: Medicare Other | Admitting: Physician Assistant

## 2015-02-23 VITALS — BP 111/69 | HR 106 | Temp 97.9°F | Resp 18 | Ht 70.0 in | Wt 149.2 lb

## 2015-02-23 DIAGNOSIS — C3492 Malignant neoplasm of unspecified part of left bronchus or lung: Secondary | ICD-10-CM

## 2015-02-23 DIAGNOSIS — C3412 Malignant neoplasm of upper lobe, left bronchus or lung: Secondary | ICD-10-CM

## 2015-02-23 DIAGNOSIS — Z79899 Other long term (current) drug therapy: Secondary | ICD-10-CM

## 2015-02-23 DIAGNOSIS — C3490 Malignant neoplasm of unspecified part of unspecified bronchus or lung: Secondary | ICD-10-CM

## 2015-02-23 LAB — CBC WITH DIFFERENTIAL/PLATELET
BASO%: 0.6 % (ref 0.0–2.0)
BASOS ABS: 0.1 10*3/uL (ref 0.0–0.1)
EOS ABS: 0 10*3/uL (ref 0.0–0.5)
EOS%: 0.2 % (ref 0.0–7.0)
HCT: 41 % (ref 38.4–49.9)
HGB: 13.5 g/dL (ref 13.0–17.1)
LYMPH#: 1 10*3/uL (ref 0.9–3.3)
LYMPH%: 9.6 % — AB (ref 14.0–49.0)
MCH: 29.6 pg (ref 27.2–33.4)
MCHC: 32.9 g/dL (ref 32.0–36.0)
MCV: 89.9 fL (ref 79.3–98.0)
MONO#: 0.8 10*3/uL (ref 0.1–0.9)
MONO%: 7.6 % (ref 0.0–14.0)
NEUT#: 8.9 10*3/uL — ABNORMAL HIGH (ref 1.5–6.5)
NEUT%: 82 % — AB (ref 39.0–75.0)
PLATELETS: 207 10*3/uL (ref 140–400)
RBC: 4.56 10*6/uL (ref 4.20–5.82)
RDW: 15.9 % — ABNORMAL HIGH (ref 11.0–14.6)
WBC: 10.8 10*3/uL — AB (ref 4.0–10.3)

## 2015-02-23 LAB — COMPREHENSIVE METABOLIC PANEL (CC13)
ALT: 7 U/L (ref 0–55)
ANION GAP: 8 meq/L (ref 3–11)
AST: 8 U/L (ref 5–34)
Albumin: 3.7 g/dL (ref 3.5–5.0)
Alkaline Phosphatase: 91 U/L (ref 40–150)
BILIRUBIN TOTAL: 0.35 mg/dL (ref 0.20–1.20)
BUN: 16.4 mg/dL (ref 7.0–26.0)
CHLORIDE: 104 meq/L (ref 98–109)
CO2: 27 mEq/L (ref 22–29)
Calcium: 9.2 mg/dL (ref 8.4–10.4)
Creatinine: 1.1 mg/dL (ref 0.7–1.3)
EGFR: 84 mL/min/{1.73_m2} — AB (ref >90)
GLUCOSE: 100 mg/dL (ref 70–140)
Potassium: 3.6 mEq/L (ref 3.5–5.1)
SODIUM: 139 meq/L (ref 136–145)
Total Protein: 7.1 g/dL (ref 6.4–8.3)

## 2015-02-23 NOTE — Progress Notes (Addendum)
Marco [name] [phone]   [phone]  OFFICE PROGRESS NOTE  [name],[name], MD [address] [place] [address]  DIAGNOSIS: Recurrent non-small cell lung cancer initially diagnosed as Stage IIA (T2a., N1, M0) non-small cell lung cancer, squamous cell carcinoma presented with central left upper lobe mass as well as hilar lymphadenopathy diagnosed in January of 2015.  PRIOR THERAPY: Concurrent chemoradiation with weekly carboplatin for AUC of 2 and paclitaxel 45 mg/M2, status post 7 cycles. Last cycle was given 12/22/2013  CURRENT THERAPY: Systemic chemotherapy with carboplatin for AUC of 5 and paclitaxel 175 MG/M2 with Neulasta support every 3 weeks. Status post 3 cycles  INTERVAL HISTORY: Marco [name] 64 y.o. male returns to the clinic today for followup visit accompanied by his caregiver. He is currently being treated with systemic chemotherapy in the form of carboplatin for an AUC of 5 and paclitaxel 175 mg/m given every 3 weeks with Neulasta support, status post 3 cycles. Overall he is tolerating his chemotherapy relatively well with the exception of some fatigue. His appetite is good. Today he voices no specific complaints. He recently had a restaging CT scan of the chest and presents to discuss results.    MEDICAL HISTORY: Past Medical History  Diagnosis Date  . Mental disorder     sczizophrenia;moderate retardation  . Depression   . DEMENTIA   . Hypertension     no medications, no documented history per caregiver at preadmission  . GERD (gastroesophageal reflux disease)   . Seizures   . Developmental disability     developmentaly delayed  . Hx of radiation therapy 11/07/13-12/24/13    lung,60Gy/51f  . Mental retardation   . Seizure disorder   . Lung cancer     ALLERGIES:  has No Known Allergies.  MEDICATIONS:  Current Outpatient Prescriptions  Medication Sig Dispense Refill  . bisacodyl (DULCOLAX) 5 MG EC  tablet Take 5 mg by mouth daily as needed for moderate constipation.    .Marland Kitchendonepezil (ARICEPT) 10 MG tablet Take 10 mg by mouth.    . fluticasone (FLONASE) 50 MCG/ACT nasal spray Place 2 sprays into both nostrils daily.    .Marland KitchenlevETIRAcetam (KEPPRA) 1000 MG tablet Take 1 tablet (1,000 mg total) by mouth 2 (two) times daily. 180 tablet 3  . LORazepam (ATIVAN) 1 MG tablet Take 2 mg by mouth at bedtime.    .Marland Kitchenomeprazole (PRILOSEC) 20 MG capsule Take 20 mg by mouth 2 (two) times daily.    . prochlorperazine (COMPAZINE) 10 MG tablet Take 1 tablet (10 mg total) by mouth every 6 (six) hours as needed for nausea or vomiting. 30 tablet 0  . risperiDONE (RISPERDAL) 3 MG tablet Take 3 mg by mouth at bedtime.    . traZODone (DESYREL) 50 MG tablet Take 25 mg by mouth at bedtime. Pt takes 1/2 tablet at bedtime     No current facility-administered medications for this visit.    SURGICAL HISTORY:  Past Surgical History  Procedure Laterality Date  . Cataract extraction w/phaco  07/12/2011    Procedure: CATARACT EXTRACTION PHACO AND INTRAOCULAR LENS PLACEMENT (IOC);  Surgeon: GAdonis Brook  [facility];  Service: Ophthalmology;  Laterality: Left;  .Marland KitchenEye surgery      L eye  . Video bronchoscopy Bilateral 10/01/2013    Procedure: VIDEO BRONCHOSCOPY WITHOUT FLUORO;  Surgeon: RCollene Gobble MD;  [facility]  Service: Cardiopulmonary;  Laterality: Bilateral;    REVIEW  OF SYSTEMS:  Constitutional: positive for fatigue Eyes: negative Ears, nose, mouth, throat, and face: negative Respiratory: negative Cardiovascular: negative Gastrointestinal: negative Genitourinary:negative Integument/breast: negative Hematologic/lymphatic: negative Musculoskeletal:negative Neurological: negative Behavioral/Psych: negative Endocrine: negative Allergic/Immunologic: negative   PHYSICAL EXAMINATION: General appearance: alert, cooperative and no distress Head: Normocephalic, without obvious abnormality,  atraumatic Neck: no adenopathy, no JVD, supple, symmetrical, trachea midline and thyroid not enlarged, symmetric, no tenderness/mass/nodules Lymph nodes: Cervical, supraclavicular, and axillary nodes normal. Resp: clear to auscultation bilaterally Back: symmetric, no curvature. ROM normal. No CVA tenderness. Cardio: regular rate and rhythm, S1, S2 normal, no murmur, click, rub or gallop GI: soft, non-tender; bowel sounds normal; no masses,  no organomegaly Extremities: extremities normal, atraumatic, no cyanosis or edema Neurologic: Alert and oriented X 3, normal strength and tone. Normal symmetric reflexes. Normal coordination and gait  ECOG PERFORMANCE STATUS: 1 - Symptomatic but completely ambulatory  Blood pressure 111/69, pulse 106, temperature 97.9 F (36.6 C), temperature source Oral, resp. rate 18, height '5\' 10"'$  (1.778 m), weight 149 lb 3.2 oz (67.677 kg), SpO2 100 %.  LABORATORY DATA: Lab Results  Component Value Date   WBC 10.8* 02/23/2015   HGB 13.5 02/23/2015   HCT 41.0 02/23/2015   MCV 89.9 02/23/2015   PLT 207 02/23/2015      Chemistry      Component Value Date/Time   NA 139 02/23/2015 1202   NA 147* 10/12/2014 0810   K 3.6 02/23/2015 1202   K 3.6 10/12/2014 0810   CL 110 10/12/2014 0810   CO2 27 02/23/2015 1202   CO2 29 10/12/2014 0810   BUN 16.4 02/23/2015 1202   BUN 17 10/12/2014 0810   CREATININE 1.1 02/23/2015 1202   CREATININE 1.09 10/12/2014 0810      Component Value Date/Time   CALCIUM 9.2 02/23/2015 1202   CALCIUM 8.8 10/12/2014 0810   ALKPHOS 91 02/23/2015 1202   ALKPHOS 70 10/08/2014 0655   AST 8 02/23/2015 1202   AST 27 10/08/2014 0655   ALT 7 02/23/2015 1202   ALT 21 10/08/2014 0655   BILITOT 0.35 02/23/2015 1202   BILITOT 0.5 10/08/2014 0655       RADIOGRAPHIC STUDIES: Ct Chest W Contrast  02/19/2015   CLINICAL DATA:  Lung cancer, dry cough.  EXAM: CT CHEST WITH CONTRAST  TECHNIQUE: Multidetector CT imaging of the chest was  performed during intravenous contrast administration.  CONTRAST:  46m OMNIPAQUE IOHEXOL 300 MG/ML  SOLN  COMPARISON:  11/11/2014.  FINDINGS: Mediastinum/Nodes: No pathologically enlarged mediastinal, right hilar or axillary lymph nodes. Heart size normal. Small amount of pericardial fluid, similar to the prior exam. Small hiatal hernia. Air is seen throughout the esophagus, suggesting dysmotility.  Lungs/Pleura: Left upper lobe mass measures 3.9 x 5.9 cm (previously 2.9 x 4.9 cm). Surrounding architectural distortion and volume loss, indicative of radiation therapy. Ill-defined peribronchovascular nodularity in the apical and posterior segments of the right upper lobe is new. Note is made that image quality is degraded by respiratory motion. No pleural fluid. Airway is unremarkable.  Upper abdomen: Visualized portions of the liver, gallbladder and adrenal glands are unremarkable. Low-attenuation lesions in the kidneys measure up to 2.0 cm on the right, stable and likely cysts. Visualized portions of the kidneys, spleen, pancreas, stomach and bowel are otherwise grossly unremarkable. No upper abdominal adenopathy.  Musculoskeletal: No worrisome lytic or sclerotic lesions.  IMPRESSION: 1. Enlarging left upper lobe mass, consistent with disease progression. 2. Ill-defined peribronchovascular nodularity in the right upper lobe suggests an  infectious bronchiolitis.   Electronically Signed   By: Lorin Picket M.D.   On: 02/19/2015 14:19   ASSESSMENT AND PLAN: This is a very pleasant 64 years old [place] American male with unresectable a stage IIA non-small cell lung cancer. He completed a course of concurrent chemoradiation with weekly carboplatin and paclitaxel is status post 7 cycles. Has been observation since his treatment in April 2015.  His  CT scan of the chest showed further enlargement of the left upper lobe suprahilar mass concerning for disease recurrence. He is currently being treated with systemic  chemotherapy in the form of carboplatin for an AUC of 5 and paclitaxel 175 mg/m with Neulasta support given every 3 weeks, status post 3 cycles. Overall he is tolerating his chemotherapy relatively well the exception of some fatigue. Patient was discussed with and also seen by Dr. [name]. His most recent restaging CT scan of the chest shows evidence for disease progression. We will discontinue the current treatment with carboplatin and paclitaxel with Neulasta support. Immunotherapy was discussed with the patient and he is interested in proceeding with his treatment. He is aware that side effects include but are not limited to pneumonitis, colitis, dermatitis, and thyroid abnormalities. He will follow-up in one week to initiate immunotherapy with Nivolumab.he'll follow-up in 3 weeks for reevaluation and prior to cycle #2 of his immunotherapy.  He was advised to call immediately if he has any concerning symptoms in the interval. The patient voices understanding of current disease status and treatment options and is in agreement with the current care plan.  All questions were answered. The patient knows to call the clinic with any problems, questions or concerns. We can certainly see the patient much sooner if necessary.  Carlton Adam, PA-C 02/23/2015  ADDENDUM: Hematology/Oncology Attending: I had a face to face encounter with the patient. I recommended his care plan. This is a very pleasant 64 years old African-American male with recurrent non-small cell lung cancer, squamous cell carcinoma who was recently treated with systemic chemotherapy with carboplatin and paclitaxel but unfortunately the recent CT scan of the chest showed evidence for disease progression. I had a lengthy discussion with the patient and his caregiver about his current condition and treatment options. I recommended for them to discontinue his current treatment with chemotherapy. I would consider the patient for treatment with  immunotherapy with Nivolumab 3 MG/KG every 2 weeks. I discussed with the patient and his caregiver the adverse effect of this treatment including but not limited to immune mediated pneumonitis, colitis, skin rash, endocrine dysfunction as well as liver or renal dysfunction. The patient and his caregiver would like to proceed with treatment as scheduled. He is expected to start the first knows of his treatment next week. The patient would come back for follow-up visit in 3 weeks for reevaluation and management of any adverse effect of his treatment before starting cycle #2. He was advised to call immediately if he has any concerning symptoms in the interval.  Disclaimer: This note was dictated with voice recognition software. Similar sounding words can inadvertently be transcribed and may not be corrected upon review. Eilleen Kempf., MD 02/28/2015

## 2015-02-23 NOTE — Telephone Encounter (Signed)
Called and left a message with upcoming appointments per pof  anne

## 2015-02-25 ENCOUNTER — Ambulatory Visit: Payer: Medicaid Other

## 2015-02-27 NOTE — Patient Instructions (Signed)
Your CT scan shows evidence for disease progression. We are discontinue her current treatment and you will be changed to immunotherapy Follow-up in one week to begin immunotherapy Follow-up in 3 weeks for reevaluation, prior to next scheduled cycle of immunotherapy

## 2015-03-01 ENCOUNTER — Other Ambulatory Visit: Payer: Self-pay

## 2015-03-02 ENCOUNTER — Ambulatory Visit (HOSPITAL_BASED_OUTPATIENT_CLINIC_OR_DEPARTMENT_OTHER): Payer: Medicare Other

## 2015-03-02 ENCOUNTER — Other Ambulatory Visit (HOSPITAL_BASED_OUTPATIENT_CLINIC_OR_DEPARTMENT_OTHER): Payer: Medicare Other

## 2015-03-02 VITALS — BP 111/71 | HR 101 | Temp 97.5°F | Resp 17

## 2015-03-02 DIAGNOSIS — C3412 Malignant neoplasm of upper lobe, left bronchus or lung: Secondary | ICD-10-CM

## 2015-03-02 DIAGNOSIS — Z79899 Other long term (current) drug therapy: Secondary | ICD-10-CM | POA: Diagnosis not present

## 2015-03-02 DIAGNOSIS — C3492 Malignant neoplasm of unspecified part of left bronchus or lung: Secondary | ICD-10-CM

## 2015-03-02 DIAGNOSIS — Z5112 Encounter for antineoplastic immunotherapy: Secondary | ICD-10-CM

## 2015-03-02 DIAGNOSIS — C3490 Malignant neoplasm of unspecified part of unspecified bronchus or lung: Secondary | ICD-10-CM

## 2015-03-02 LAB — CBC WITH DIFFERENTIAL/PLATELET
BASO%: 0.6 % (ref 0.0–2.0)
Basophils Absolute: 0.1 10*3/uL (ref 0.0–0.1)
EOS%: 0.3 % (ref 0.0–7.0)
Eosinophils Absolute: 0 10*3/uL (ref 0.0–0.5)
HCT: 39.5 % (ref 38.4–49.9)
HGB: 13.1 g/dL (ref 13.0–17.1)
LYMPH%: 13.7 % — ABNORMAL LOW (ref 14.0–49.0)
MCH: 29.8 pg (ref 27.2–33.4)
MCHC: 33.2 g/dL (ref 32.0–36.0)
MCV: 89.8 fL (ref 79.3–98.0)
MONO#: 0.6 10*3/uL (ref 0.1–0.9)
MONO%: 7 % (ref 0.0–14.0)
NEUT#: 6.8 10*3/uL — ABNORMAL HIGH (ref 1.5–6.5)
NEUT%: 78.4 % — ABNORMAL HIGH (ref 39.0–75.0)
PLATELETS: 196 10*3/uL (ref 140–400)
RBC: 4.4 10*6/uL (ref 4.20–5.82)
RDW: 15.7 % — ABNORMAL HIGH (ref 11.0–14.6)
WBC: 8.7 10*3/uL (ref 4.0–10.3)
lymph#: 1.2 10*3/uL (ref 0.9–3.3)

## 2015-03-02 LAB — COMPREHENSIVE METABOLIC PANEL (CC13)
ALBUMIN: 3.8 g/dL (ref 3.5–5.0)
ALK PHOS: 90 U/L (ref 40–150)
ALT: <6 U/L (ref 0–55)
ANION GAP: 8 meq/L (ref 3–11)
AST: 7 U/L (ref 5–34)
BUN: 14.4 mg/dL (ref 7.0–26.0)
CO2: 27 meq/L (ref 22–29)
Calcium: 8.8 mg/dL (ref 8.4–10.4)
Chloride: 104 mEq/L (ref 98–109)
Creatinine: 1 mg/dL (ref 0.7–1.3)
EGFR: >90 mL/min/{1.73_m2} (ref >90)
Glucose: 100 mg/dl (ref 70–140)
Potassium: 3.5 mEq/L (ref 3.5–5.1)
Sodium: 140 mEq/L (ref 136–145)
Total Bilirubin: 0.32 mg/dL (ref 0.20–1.20)
Total Protein: 7.2 g/dL (ref 6.4–8.3)

## 2015-03-02 LAB — TSH CHCC: TSH: 0.673 m(IU)/L (ref 0.320–4.118)

## 2015-03-02 MED ORDER — SODIUM CHLORIDE 0.9 % IV SOLN
3.0000 mg/kg | Freq: Once | INTRAVENOUS | Status: AC
  Administered 2015-03-02: 200 mg via INTRAVENOUS
  Filled 2015-03-02: qty 20

## 2015-03-02 MED ORDER — SODIUM CHLORIDE 0.9 % IV SOLN
Freq: Once | INTRAVENOUS | Status: AC
  Administered 2015-03-02: 13:00:00 via INTRAVENOUS

## 2015-03-02 NOTE — Patient Instructions (Signed)
Nivolumab injection What is this medicine? NIVOLUMAB (nye VOL ue mab) is used to treat certain types of melanoma and lung cancer. This medicine may be used for other purposes; ask your health care provider or pharmacist if you have questions. COMMON BRAND NAME(S): Opdivo What should I tell my health care provider before I take this medicine? They need to know if you have any of these conditions: -eye disease, vision problems -history of pancreatitis -immune system problems -inflammatory bowel disease -kidney disease -liver disease -lung disease -lupus -myasthenia gravis -multiple sclerosis -organ transplant -stomach or intestine problems -thyroid disease -tingling of the fingers or toes, or other nerve disorder -an unusual or allergic reaction to nivolumab, other medicines, foods, dyes, or preservatives -pregnant or trying to get pregnant -breast-feeding How should I use this medicine? This medicine is for infusion into a vein. It is given by a health care professional in a hospital or clinic setting. A special MedGuide will be given to you before each treatment. Be sure to read this information carefully each time. Talk to your pediatrician regarding the use of this medicine in children. Special care may be needed. Overdosage: If you think you've taken too much of this medicine contact a poison control center or emergency room at once. Overdosage: If you think you have taken too much of this medicine contact a poison control center or emergency room at once. NOTE: This medicine is only for you. Do not share this medicine with others. What if I miss a dose? It is important not to miss your dose. Call your doctor or health care professional if you are unable to keep an appointment. What may interact with this medicine? Interactions have not been studied. This list may not describe all possible interactions. Give your health care provider a list of all the medicines, herbs,  non-prescription drugs, or dietary supplements you use. Also tell them if you smoke, drink alcohol, or use illegal drugs. Some items may interact with your medicine. What should I watch for while using this medicine? Tell your doctor or healthcare professional if your symptoms do not start to get better or if they get worse. Your condition will be monitored carefully while you are receiving this medicine. You may need blood work done while you are taking this medicine. What side effects may I notice from receiving this medicine? Side effects that you should report to your doctor or health care professional as soon as possible: -allergic reactions like skin rash, itching or hives, swelling of the face, lips, or tongue -black, tarry stools -bloody or watery diarrhea -changes in vision -chills -cough -depressed mood -eye pain -feeling anxious -fever -general ill feeling or flu-like symptoms -hair loss -loss of appetite -low blood counts - this medicine may decrease the number of white blood cells, red blood cells and platelets. You may be at increased risk for infections and bleeding -pain, tingling, numbness in the hands or feet -redness, blistering, peeling or loosening of the skin, including inside the mouth -red pinpoint spots on skin -signs of decreased platelets or bleeding - bruising, pinpoint red spots on the skin, black, tarry stools, blood in the urine -signs of decreased red blood cells - unusually weak or tired, feeling faint or lightheaded, falls -signs of infection - fever or chills, cough, sore throat, pain or trouble passing urine -signs and symptoms of a dangerous change in heartbeat or heart rhythm like chest pain; dizziness; fast or irregular heartbeat; palpitations; feeling faint or lightheaded, falls; breathing problems -signs   and symptoms of high blood sugar such as dizziness; dry mouth; dry skin; fruity breath; nausea; stomach pain; increased hunger or thirst; increased  urination -signs and symptoms of kidney injury like trouble passing urine or change in the amount of urine -signs and symptoms of liver injury like dark yellow or brown urine; general ill feeling or flu-like symptoms; light-colored stools; loss of appetite; nausea; right upper belly pain; unusually weak or tired; yellowing of the eyes or skin -signs and symptoms of increased potassium like muscle weakness; chest pain; or fast, irregular heartbeat -signs and symptoms of low potassium like muscle cramps or muscle pain; chest pain; dizziness; feeling faint or lightheaded, falls; palpitations; breathing problems; or fast, irregular heartbeat -swelling of the ankles, feet, hands -weight gainSide effects that usually do not require medical attention (report to your doctor or health care professional if they continue or are bothersome): -constipation -general ill feeling or flu-like symptoms -hair loss -loss of appetite -nausea, vomiting This list may not describe all possible side effects. Call your doctor for medical advice about side effects. You may report side effects to FDA at 1-800-FDA-1088. Where should I keep my medicine? This drug is given in a hospital or clinic and will not be stored at home. NOTE: This sheet is a summary. It may not cover all possible information. If you have questions about this medicine, talk to your doctor, pharmacist, or health care provider.  2015, [employer]vier/Gold Standard. (2013-11-10 13:18:19)   [facility] Discharge Instructions for Patients Receiving Chemotherapy  Today you received the following chemotherapy agents Nivolumab  To help prevent nausea and vomiting after your treatment, we encourage you to take your nausea medication as directed.   If you develop nausea and vomiting that is not controlled by your nausea medication, call the clinic.   BELOW ARE SYMPTOMS THAT SHOULD BE REPORTED IMMEDIATELY:  *FEVER GREATER THAN 100.5 F  *CHILLS  WITH OR WITHOUT FEVER  NAUSEA AND VOMITING THAT IS NOT CONTROLLED WITH YOUR NAUSEA MEDICATION  *UNUSUAL SHORTNESS OF BREATH  *UNUSUAL BRUISING OR BLEEDING  TENDERNESS IN MOUTH AND THROAT WITH OR WITHOUT PRESENCE OF ULCERS  *URINARY PROBLEMS  *BOWEL PROBLEMS  UNUSUAL RASH Items with * indicate a potential emergency and should be followed up as soon as possible.  Feel free to call the clinic you have any questions or concerns. The clinic phone number is [phone].  Please show the [place] at check-in to the Emergency Department and triage nurse.

## 2015-03-04 ENCOUNTER — Telehealth: Payer: Self-pay | Admitting: Medical Oncology

## 2015-03-04 NOTE — Telephone Encounter (Signed)
"  He did well. No diarrhea , no nausea-he is eating lunch now." He understands to call for any problems.

## 2015-03-16 ENCOUNTER — Other Ambulatory Visit (HOSPITAL_BASED_OUTPATIENT_CLINIC_OR_DEPARTMENT_OTHER): Payer: Medicare Other

## 2015-03-16 ENCOUNTER — Encounter: Payer: Self-pay | Admitting: Physician Assistant

## 2015-03-16 ENCOUNTER — Ambulatory Visit (HOSPITAL_BASED_OUTPATIENT_CLINIC_OR_DEPARTMENT_OTHER): Payer: Medicare Other | Admitting: Physician Assistant

## 2015-03-16 ENCOUNTER — Ambulatory Visit (HOSPITAL_BASED_OUTPATIENT_CLINIC_OR_DEPARTMENT_OTHER): Payer: Medicare Other

## 2015-03-16 ENCOUNTER — Telehealth: Payer: Self-pay | Admitting: Internal Medicine

## 2015-03-16 VITALS — BP 128/77 | HR 95 | Temp 98.0°F | Resp 18 | Ht 70.0 in | Wt 149.3 lb

## 2015-03-16 DIAGNOSIS — C3492 Malignant neoplasm of unspecified part of left bronchus or lung: Secondary | ICD-10-CM

## 2015-03-16 DIAGNOSIS — R350 Frequency of micturition: Secondary | ICD-10-CM | POA: Diagnosis not present

## 2015-03-16 DIAGNOSIS — R32 Unspecified urinary incontinence: Secondary | ICD-10-CM

## 2015-03-16 DIAGNOSIS — C3412 Malignant neoplasm of upper lobe, left bronchus or lung: Secondary | ICD-10-CM

## 2015-03-16 DIAGNOSIS — Z5112 Encounter for antineoplastic immunotherapy: Secondary | ICD-10-CM

## 2015-03-16 DIAGNOSIS — C3490 Malignant neoplasm of unspecified part of unspecified bronchus or lung: Secondary | ICD-10-CM

## 2015-03-16 LAB — COMPREHENSIVE METABOLIC PANEL (CC13)
ALT: 6 U/L (ref 0–55)
AST: 8 U/L (ref 5–34)
Albumin: 3.7 g/dL (ref 3.5–5.0)
Alkaline Phosphatase: 94 U/L (ref 40–150)
Anion Gap: 8 mEq/L (ref 3–11)
BUN: 9.3 mg/dL (ref 7.0–26.0)
CALCIUM: 9.4 mg/dL (ref 8.4–10.4)
CHLORIDE: 109 meq/L (ref 98–109)
CO2: 26 mEq/L (ref 22–29)
Creatinine: 1 mg/dL (ref 0.7–1.3)
EGFR: >90 mL/min/{1.73_m2} (ref >90)
Glucose: 107 mg/dl (ref 70–140)
POTASSIUM: 3.5 meq/L (ref 3.5–5.1)
SODIUM: 143 meq/L (ref 136–145)
TOTAL PROTEIN: 7.3 g/dL (ref 6.4–8.3)
Total Bilirubin: 0.48 mg/dL (ref 0.20–1.20)

## 2015-03-16 LAB — URINALYSIS, MICROSCOPIC - CHCC
Bilirubin (Urine): NEGATIVE
Blood: NEGATIVE
Glucose: NEGATIVE mg/dL
KETONES: NEGATIVE mg/dL
LEUKOCYTE ESTERASE: NEGATIVE
NITRITE: NEGATIVE
PH: 6 (ref 4.6–8.0)
Protein: NEGATIVE mg/dL
RBC / HPF: NEGATIVE (ref 0–2)
Specific Gravity, Urine: 1.02 (ref 1.003–1.035)
Urobilinogen, UR: 0.2 mg/dL (ref 0.2–1)

## 2015-03-16 LAB — CBC WITH DIFFERENTIAL/PLATELET
BASO%: 1 % (ref 0.0–2.0)
BASOS ABS: 0.1 10*3/uL (ref 0.0–0.1)
EOS ABS: 0 10*3/uL (ref 0.0–0.5)
EOS%: 0.7 % (ref 0.0–7.0)
HCT: 40.8 % (ref 38.4–49.9)
HGB: 13.3 g/dL (ref 13.0–17.1)
LYMPH%: 16.7 % (ref 14.0–49.0)
MCH: 29.2 pg (ref 27.2–33.4)
MCHC: 32.5 g/dL (ref 32.0–36.0)
MCV: 89.7 fL (ref 79.3–98.0)
MONO#: 0.5 10*3/uL (ref 0.1–0.9)
MONO%: 7.7 % (ref 0.0–14.0)
NEUT#: 4.5 10*3/uL (ref 1.5–6.5)
NEUT%: 73.9 % (ref 39.0–75.0)
PLATELETS: 177 10*3/uL (ref 140–400)
RBC: 4.55 10*6/uL (ref 4.20–5.82)
RDW: 16.2 % — AB (ref 11.0–14.6)
WBC: 6.1 10*3/uL (ref 4.0–10.3)
lymph#: 1 10*3/uL (ref 0.9–3.3)

## 2015-03-16 MED ORDER — SODIUM CHLORIDE 0.9 % IV SOLN
3.0000 mg/kg | Freq: Once | INTRAVENOUS | Status: AC
  Administered 2015-03-16: 200 mg via INTRAVENOUS
  Filled 2015-03-16: qty 20

## 2015-03-16 MED ORDER — SODIUM CHLORIDE 0.9 % IV SOLN
Freq: Once | INTRAVENOUS | Status: AC
  Administered 2015-03-16: 16:00:00 via INTRAVENOUS

## 2015-03-16 NOTE — Progress Notes (Addendum)
[place] [phone]   [phone]  OFFICE PROGRESS NOTE  [name],[name], MD [address] [place] [address]  DIAGNOSIS: Recurrent non-small cell lung cancer initially diagnosed as Stage IIA (T2a., N1, M0) non-small cell lung cancer, squamous cell carcinoma presented with central left upper lobe mass as well as hilar lymphadenopathy diagnosed in January of 2015.  PRIOR THERAPY:  1. Concurrent chemoradiation with weekly carboplatin for AUC of 2 and paclitaxel 45 mg/M2, status post 7 cycles. Last cycle was given 12/22/2013  2. Systemic chemotherapy with carboplatin for AUC of 5 and paclitaxel 175 MG/M2 with Neulasta support every 3 weeks. Status post 3 cycles  CURRENT THERAPY: Immunotherapy with Nivolumab 3 mg/kg given every 2 weeks. Status post 1 cycle  INTERVAL HISTORY: Marco [name] 64 y.o. male returns to the clinic today for followup visit accompanied by his caregiver. He is status post 1 cycle of immunotherapy with  Nivolumab.he tolerated this first cycle without difficulty. He continues to have a dry cough that is been present for over a month. This is totally nonproductive and not associated with any fever or chills. He denies any changes in his baseline breathing, stating that it is stable. He denied any development of skin rashes or diarrhea. He has been having some issues with urinary incontinence/urinary frequency.  His appetite is good. Today he voices no other specific complaints.   MEDICAL HISTORY: Past Medical History  Diagnosis Date  . Mental disorder     sczizophrenia;moderate retardation  . Depression   . DEMENTIA   . Hypertension     no medications, no documented history per caregiver at preadmission  . GERD (gastroesophageal reflux disease)   . Seizures   . Developmental disability     developmentaly delayed  . Hx of radiation therapy 11/07/13-12/24/13    lung,60Gy/14f  . Mental retardation   .  Seizure disorder   . Lung cancer     ALLERGIES:  has No Known Allergies.  MEDICATIONS:  Current Outpatient Prescriptions  Medication Sig Dispense Refill  . bisacodyl (DULCOLAX) 5 MG EC tablet Take 5 mg by mouth daily as needed for moderate constipation.    .Marland Kitchendonepezil (ARICEPT) 10 MG tablet Take 10 mg by mouth.    . fluticasone (FLONASE) 50 MCG/ACT nasal spray Place 2 sprays into both nostrils daily.    .Marland KitchenlevETIRAcetam (KEPPRA) 1000 MG tablet Take 1 tablet (1,000 mg total) by mouth 2 (two) times daily. 180 tablet 3  . LORazepam (ATIVAN) 1 MG tablet Take 2 mg by mouth at bedtime.    .Marland Kitchenomeprazole (PRILOSEC) 20 MG capsule Take 20 mg by mouth 2 (two) times daily.    . prochlorperazine (COMPAZINE) 10 MG tablet Take 1 tablet (10 mg total) by mouth every 6 (six) hours as needed for nausea or vomiting. 30 tablet 0  . risperiDONE (RISPERDAL) 3 MG tablet Take 3 mg by mouth at bedtime.    . traZODone (DESYREL) 50 MG tablet Take 25 mg by mouth at bedtime. Pt takes 1/2 tablet at bedtime     No current facility-administered medications for this visit.    SURGICAL HISTORY:  Past Surgical History  Procedure Laterality Date  . Cataract extraction w/phaco  07/12/2011    Procedure: CATARACT EXTRACTION PHACO AND INTRAOCULAR LENS PLACEMENT (IOC);  Surgeon: GAdonis Brook  [facility];  Service: Ophthalmology;  Laterality: Left;  .Marland KitchenEye surgery      L eye  .  Video bronchoscopy Bilateral 10/01/2013    Procedure: VIDEO BRONCHOSCOPY WITHOUT FLUORO;  Surgeon: Collene Gobble, MD;  Location: [place];  Service: Cardiopulmonary;  Laterality: Bilateral;    REVIEW OF SYSTEMS:  Constitutional: positive for fatigue Eyes: negative Ears, nose, mouth, throat, and face: negative Respiratory: positive for cough and Dry/nonproductive Cardiovascular: negative Gastrointestinal: negative Genitourinary:positive for frequency and urinary incontinence Integument/breast: negative Hematologic/lymphatic:  negative Musculoskeletal:negative Neurological: negative Behavioral/Psych: negative Endocrine: negative Allergic/Immunologic: negative   PHYSICAL EXAMINATION: General appearance: alert, cooperative and no distress Head: Normocephalic, without obvious abnormality, atraumatic Neck: no adenopathy, no JVD, supple, symmetrical, trachea midline and thyroid not enlarged, symmetric, no tenderness/mass/nodules Lymph nodes: Cervical, supraclavicular, and axillary nodes normal. Resp: clear to auscultation bilaterally Back: symmetric, no curvature. ROM normal. No CVA tenderness. Cardio: regular rate and rhythm, S1, S2 normal, no murmur, click, rub or gallop GI: soft, non-tender; bowel sounds normal; no masses,  no organomegaly Extremities: extremities normal, atraumatic, no cyanosis or edema Neurologic: Alert and oriented X 3, normal strength and tone. Normal symmetric reflexes. Normal coordination and gait  ECOG PERFORMANCE STATUS: 1 - Symptomatic but completely ambulatory  Blood pressure 128/77, pulse 95, temperature 98 F (36.7 C), temperature source Oral, resp. rate 18, height '5\' 10"'$  (1.778 m), weight 149 lb 4.8 oz (67.722 kg), SpO2 100 %.  LABORATORY DATA: Lab Results  Component Value Date   WBC 6.1 03/16/2015   HGB 13.3 03/16/2015   HCT 40.8 03/16/2015   MCV 89.7 03/16/2015   PLT 177 03/16/2015      Chemistry      Component Value Date/Time   NA 143 03/16/2015 1412   NA 147* 10/12/2014 0810   K 3.5 03/16/2015 1412   K 3.6 10/12/2014 0810   CL 110 10/12/2014 0810   CO2 26 03/16/2015 1412   CO2 29 10/12/2014 0810   BUN 9.3 03/16/2015 1412   BUN 17 10/12/2014 0810   CREATININE 1.0 03/16/2015 1412   CREATININE 1.09 10/12/2014 0810      Component Value Date/Time   CALCIUM 9.4 03/16/2015 1412   CALCIUM 8.8 10/12/2014 0810   ALKPHOS 94 03/16/2015 1412   ALKPHOS 70 10/08/2014 0655   AST 8 03/16/2015 1412   AST 27 10/08/2014 0655   ALT 6 03/16/2015 1412   ALT 21 10/08/2014  0655   BILITOT 0.48 03/16/2015 1412   BILITOT 0.5 10/08/2014 0655       RADIOGRAPHIC STUDIES: Ct Chest W Contrast  02/19/2015   CLINICAL DATA:  Lung cancer, dry cough.  EXAM: CT CHEST WITH CONTRAST  TECHNIQUE: Multidetector CT imaging of the chest was performed during intravenous contrast administration.  CONTRAST:  62m OMNIPAQUE IOHEXOL 300 MG/ML  SOLN  COMPARISON:  11/11/2014.  FINDINGS: Mediastinum/Nodes: No pathologically enlarged mediastinal, right hilar or axillary lymph nodes. Heart size normal. Small amount of pericardial fluid, similar to the prior exam. Small hiatal hernia. Air is seen throughout the esophagus, suggesting dysmotility.  Lungs/Pleura: Left upper lobe mass measures 3.9 x 5.9 cm (previously 2.9 x 4.9 cm). Surrounding architectural distortion and volume loss, indicative of radiation therapy. Ill-defined peribronchovascular nodularity in the apical and posterior segments of the right upper lobe is new. Note is made that image quality is degraded by respiratory motion. No pleural fluid. Airway is unremarkable.  Upper abdomen: Visualized portions of the liver, gallbladder and adrenal glands are unremarkable. Low-attenuation lesions in the kidneys measure up to 2.0 cm on the right, stable and likely cysts. Visualized portions of the kidneys, spleen, pancreas, stomach  and bowel are otherwise grossly unremarkable. No upper abdominal adenopathy.  Musculoskeletal: No worrisome lytic or sclerotic lesions.  IMPRESSION: 1. Enlarging left upper lobe mass, consistent with disease progression. 2. Ill-defined peribronchovascular nodularity in the right upper lobe suggests an infectious bronchiolitis.   Electronically Signed   By: Lorin Picket M.D.   On: 02/19/2015 14:19   ASSESSMENT AND PLAN: This is a very pleasant 64 years old [place] American male with unresectable a stage IIA non-small cell lung cancer. He completed a course of concurrent chemoradiation with weekly carboplatin and  paclitaxel is status post 7 cycles. Has been observation since his treatment in April 2015.  His  CT scan of the chest showed further enlargement of the left upper lobe suprahilar mass concerning for disease recurrence. He is currently being treated with systemic chemotherapy in the form of carboplatin for an AUC of 5 and paclitaxel 175 mg/m with Neulasta support given every 3 weeks, status post 3 cycles. Overall he is tolerating his chemotherapy relatively well the exception of some fatigue. Patient was discussed with and also seen by Dr. [name]. His most recent restaging CT scan of the chest shows evidence for disease progression. He is currently being treated with immunotherapy in the form of  Nivolumab 3 mg/kg given every 2 weeks, status post 1 cycle. He tolerated this first cycle without difficulty. Regarding his urinary incontinence/urinary frequency. We will obtain a clean catch urinalysis and urine culture and sensitivity looking for possible low level infection. If infection is present, appropriate antibody therapy will be instituted. He will proceed with cycle #2 today as scheduled of immunotherapy. He will follow-up in 2 weeks prior to the start of cycle #3. He was advised to call immediately if he has any concerning symptoms in the interval. The patient voices understanding of current disease status and treatment options and is in agreement with the current care plan.  All questions were answered. The patient knows to call the clinic with any problems, questions or concerns. We can certainly see the patient much sooner if necessary.  Carlton Adam, PA-C 03/16/2015   ADDENDUM: Hematology/Oncology Attending: I had a face to face encounter with the patient. I recommended his care plan. This is a very pleasant 64 years old African-American male with recurrent non-small cell lung cancer, squamous cell carcinoma who is currently undergoing treatment with immunotherapy with Nivolumab status  post 1 cycle and tolerated the first treatment fairly well with no significant adverse effects. The patient denied having any significant nausea or vomiting, no diarrhea. Has some urinary symptoms today with increasing frequency. We will recheck his urinalysis to rule out any urinary tract infection. The patient will proceed with his cycle #2 today as scheduled. He would come back for follow-up visit in 2 weeks for reevaluation before starting the next cycle of his immunotherapy. He was advised to call immediately if he has any concerning symptoms in the interval.  Disclaimer: This note was dictated with voice recognition software. Similar sounding words can inadvertently be transcribed and may be missed upon review.  Eilleen Kempf., MD 03/17/2015

## 2015-03-16 NOTE — Telephone Encounter (Signed)
Add to previous note. Patient also given appointments for August.

## 2015-03-16 NOTE — Patient Instructions (Signed)
[facility] Discharge Instructions for Patients Receiving Chemotherapy  Today you received the following chemotherapy agents: Nivolumab  To help prevent nausea and vomiting after your treatment, we encourage you to take your nausea medication as prescribed by your physician.    If you develop nausea and vomiting that is not controlled by your nausea medication, call the clinic.   BELOW ARE SYMPTOMS THAT SHOULD BE REPORTED IMMEDIATELY:  *FEVER GREATER THAN 100.5 F  *CHILLS WITH OR WITHOUT FEVER  NAUSEA AND VOMITING THAT IS NOT CONTROLLED WITH YOUR NAUSEA MEDICATION  *UNUSUAL SHORTNESS OF BREATH  *UNUSUAL BRUISING OR BLEEDING  TENDERNESS IN MOUTH AND THROAT WITH OR WITHOUT PRESENCE OF ULCERS  *URINARY PROBLEMS  *BOWEL PROBLEMS  UNUSUAL RASH Items with * indicate a potential emergency and should be followed up as soon as possible.  Feel free to call the clinic you have any questions or concerns. The clinic phone number is [phone].  Please show the CHEMO ALERT CARD at check-in to the Emergency Department and triage nurse.   

## 2015-03-16 NOTE — Patient Instructions (Signed)
Your urine is being tested for possible infection. If an infection is present and you'll be placed on appropriate antibodies Follow-up in 2 weeks prior to your next scheduled cycle of immunotherapy

## 2015-03-16 NOTE — Telephone Encounter (Signed)
Gave patient avs report and appointments for July. Per AJ ok with her - MM full. Also per AJ standing lab are now q2w.

## 2015-03-18 LAB — URINE CULTURE

## 2015-03-30 ENCOUNTER — Telehealth: Payer: Self-pay | Admitting: Physician Assistant

## 2015-03-30 ENCOUNTER — Ambulatory Visit (HOSPITAL_BASED_OUTPATIENT_CLINIC_OR_DEPARTMENT_OTHER): Payer: Medicare Other | Admitting: Physician Assistant

## 2015-03-30 ENCOUNTER — Ambulatory Visit (HOSPITAL_BASED_OUTPATIENT_CLINIC_OR_DEPARTMENT_OTHER): Payer: Medicare Other

## 2015-03-30 ENCOUNTER — Other Ambulatory Visit (HOSPITAL_BASED_OUTPATIENT_CLINIC_OR_DEPARTMENT_OTHER): Payer: Medicare Other

## 2015-03-30 ENCOUNTER — Encounter: Payer: Self-pay | Admitting: Physician Assistant

## 2015-03-30 VITALS — BP 117/81 | HR 90 | Temp 98.0°F | Resp 17 | Ht 70.0 in | Wt 149.5 lb

## 2015-03-30 DIAGNOSIS — C3412 Malignant neoplasm of upper lobe, left bronchus or lung: Secondary | ICD-10-CM | POA: Diagnosis present

## 2015-03-30 DIAGNOSIS — C3492 Malignant neoplasm of unspecified part of left bronchus or lung: Secondary | ICD-10-CM

## 2015-03-30 DIAGNOSIS — K219 Gastro-esophageal reflux disease without esophagitis: Secondary | ICD-10-CM

## 2015-03-30 DIAGNOSIS — Z5112 Encounter for antineoplastic immunotherapy: Secondary | ICD-10-CM

## 2015-03-30 DIAGNOSIS — F79 Unspecified intellectual disabilities: Secondary | ICD-10-CM | POA: Diagnosis not present

## 2015-03-30 DIAGNOSIS — Z79899 Other long term (current) drug therapy: Secondary | ICD-10-CM | POA: Diagnosis not present

## 2015-03-30 DIAGNOSIS — C3490 Malignant neoplasm of unspecified part of unspecified bronchus or lung: Secondary | ICD-10-CM

## 2015-03-30 LAB — CBC WITH DIFFERENTIAL/PLATELET
BASO%: 0.8 % (ref 0.0–2.0)
BASOS ABS: 0 10*3/uL (ref 0.0–0.1)
EOS%: 0.5 % (ref 0.0–7.0)
Eosinophils Absolute: 0 10*3/uL (ref 0.0–0.5)
HCT: 39.7 % (ref 38.4–49.9)
HEMOGLOBIN: 13.1 g/dL (ref 13.0–17.1)
LYMPH%: 13.5 % — ABNORMAL LOW (ref 14.0–49.0)
MCH: 29.3 pg (ref 27.2–33.4)
MCHC: 33 g/dL (ref 32.0–36.0)
MCV: 88.8 fL (ref 79.3–98.0)
MONO#: 0.6 10*3/uL (ref 0.1–0.9)
MONO%: 10.1 % (ref 0.0–14.0)
NEUT#: 4.5 10*3/uL (ref 1.5–6.5)
NEUT%: 75.1 % — AB (ref 39.0–75.0)
Platelets: 199 10*3/uL (ref 140–400)
RBC: 4.47 10*6/uL (ref 4.20–5.82)
RDW: 14.7 % — ABNORMAL HIGH (ref 11.0–14.6)
WBC: 6 10*3/uL (ref 4.0–10.3)
lymph#: 0.8 10*3/uL — ABNORMAL LOW (ref 0.9–3.3)

## 2015-03-30 LAB — TSH CHCC: TSH: 0.265 m(IU)/L — ABNORMAL LOW (ref 0.320–4.118)

## 2015-03-30 LAB — COMPREHENSIVE METABOLIC PANEL (CC13)
ALK PHOS: 86 U/L (ref 40–150)
ALT: 7 U/L (ref 0–55)
AST: 11 U/L (ref 5–34)
Albumin: 3.8 g/dL (ref 3.5–5.0)
Anion Gap: 8 mEq/L (ref 3–11)
BUN: 13.9 mg/dL (ref 7.0–26.0)
CO2: 27 meq/L (ref 22–29)
Calcium: 8.8 mg/dL (ref 8.4–10.4)
Chloride: 104 mEq/L (ref 98–109)
Creatinine: 1.1 mg/dL (ref 0.7–1.3)
EGFR: 83 mL/min/{1.73_m2} — AB (ref >90)
GLUCOSE: 107 mg/dL (ref 70–140)
Potassium: 3.7 mEq/L (ref 3.5–5.1)
SODIUM: 138 meq/L (ref 136–145)
Total Bilirubin: 0.49 mg/dL (ref 0.20–1.20)
Total Protein: 7.3 g/dL (ref 6.4–8.3)

## 2015-03-30 MED ORDER — SODIUM CHLORIDE 0.9 % IV SOLN
3.0000 mg/kg | Freq: Once | INTRAVENOUS | Status: AC
  Administered 2015-03-30: 200 mg via INTRAVENOUS
  Filled 2015-03-30: qty 20

## 2015-03-30 MED ORDER — SODIUM CHLORIDE 0.9 % IV SOLN
Freq: Once | INTRAVENOUS | Status: AC
  Administered 2015-03-30: 13:00:00 via INTRAVENOUS

## 2015-03-30 NOTE — Progress Notes (Addendum)
[place] [phone]   [phone]  OFFICE PROGRESS NOTE  Marco [name],Marco L, MD [address] [place] [address]  DIAGNOSIS: Recurrent non-small cell lung cancer initially diagnosed as Stage IIA (T2a., N1, M0) non-small cell lung cancer, squamous cell carcinoma presented with central left upper lobe mass as well as hilar lymphadenopathy diagnosed in January of 2015.  PRIOR THERAPY:  1. Concurrent chemoradiation with weekly carboplatin for AUC of 2 and paclitaxel 45 mg/M2, status post 7 cycles. Last cycle was given 12/22/2013  2. Systemic chemotherapy with carboplatin for AUC of 5 and paclitaxel 175 MG/M2 with Neulasta support every 3 weeks. Status post 3 cycles  CURRENT THERAPY: Immunotherapy with Nivolumab 3 mg/kg given every 2 weeks. Status post 2 cycles  INTERVAL HISTORY: Marco [name] 64 y.o. male returns to the clinic today for followup visit accompanied by his caregiver. He is status post 2 cycles of immunotherapy with  Nivolumab. Thus far he is tolerating the immunotherapy without difficulty. He continues to have a dry cough that is been present for over a month. This is totally nonproductive and not associated with any fever or chills. He denies any changes in his baseline breathing, stating that it is stable. He denied any development of skin rashes or diarrhea. He has been having some issues with urinary incontinence/urinary frequency.  His appetite is good. Today he voices no other specific complaints.   MEDICAL HISTORY: Past Medical History  Diagnosis Date  . Mental disorder     sczizophrenia;moderate retardation  . Depression   . DEMENTIA   . Hypertension     no medications, no documented history per caregiver at preadmission  . GERD (gastroesophageal reflux disease)   . Seizures   . Developmental disability     developmentaly delayed  . Hx of radiation therapy 11/07/13-12/24/13    lung,60Gy/82f  . Mental  retardation   . Seizure disorder   . Lung cancer     ALLERGIES:  has No Known Allergies.  MEDICATIONS:  Current Outpatient Prescriptions  Medication Sig Dispense Refill  . bisacodyl (DULCOLAX) 5 MG EC tablet Take 5 mg by mouth daily as needed for moderate constipation.    .Marland Kitchendonepezil (ARICEPT) 10 MG tablet Take 10 mg by mouth.    . fluticasone (FLONASE) 50 MCG/ACT nasal spray Place 2 sprays into both nostrils daily.    .Marland KitchenlevETIRAcetam (KEPPRA) 1000 MG tablet Take 1 tablet (1,000 mg total) by mouth 2 (two) times daily. 180 tablet 3  . LORazepam (ATIVAN) 1 MG tablet Take 2 mg by mouth at bedtime.    .Marland Kitchenomeprazole (PRILOSEC) 20 MG capsule Take 20 mg by mouth 2 (two) times daily.    . prochlorperazine (COMPAZINE) 10 MG tablet Take 1 tablet (10 mg total) by mouth every 6 (six) hours as needed for nausea or vomiting. 30 tablet 0  . risperiDONE (RISPERDAL) 3 MG tablet Take 3 mg by mouth at bedtime.    . traZODone (DESYREL) 50 MG tablet Take 25 mg by mouth at bedtime. Pt takes 1/2 tablet at bedtime     No current facility-administered medications for this visit.    SURGICAL HISTORY:  Past Surgical History  Procedure Laterality Date  . Cataract extraction w/phaco  07/12/2011    Procedure: CATARACT EXTRACTION PHACO AND INTRAOCULAR LENS PLACEMENT (IOC);  Surgeon: GAdonis Brook  [facility];  Service: Ophthalmology;  Laterality: Left;  .Marland KitchenEye surgery      [name]  eye  . Video bronchoscopy Bilateral 10/01/2013    Procedure: VIDEO BRONCHOSCOPY WITHOUT FLUORO;  Surgeon: Collene Gobble, MD;  Location: [place];  Service: Cardiopulmonary;  Laterality: Bilateral;    REVIEW OF SYSTEMS:  Constitutional: positive for fatigue Eyes: negative Ears, nose, mouth, throat, and face: negative Respiratory: positive for cough and Dry/nonproductive Cardiovascular: negative Gastrointestinal: negative Genitourinary:positive for frequency and urinary incontinence Integument/breast:  negative Hematologic/lymphatic: negative Musculoskeletal:negative Neurological: negative Behavioral/Psych: negative Endocrine: negative Allergic/Immunologic: negative   PHYSICAL EXAMINATION: General appearance: alert, cooperative and no distress Head: Normocephalic, without obvious abnormality, atraumatic Neck: no adenopathy, no JVD, supple, symmetrical, trachea midline and thyroid not enlarged, symmetric, no tenderness/mass/nodules Lymph nodes: Cervical, supraclavicular, and axillary nodes normal. Resp: clear to auscultation bilaterally Back: symmetric, no curvature. ROM normal. No CVA tenderness. Cardio: regular rate and rhythm, S1, S2 normal, no murmur, click, rub or gallop GI: soft, non-tender; bowel sounds normal; no masses,  no organomegaly Extremities: extremities normal, atraumatic, no cyanosis or edema Neurologic: Alert and oriented X 3, normal strength and tone. Normal symmetric reflexes. Normal coordination and gait  ECOG PERFORMANCE STATUS: 1 - Symptomatic but completely ambulatory  Blood pressure 117/81, pulse 90, temperature 98 F (36.7 C), temperature source Oral, resp. rate 17, height '5\' 10"'$  (1.778 m), weight 149 lb 8 oz (67.813 kg), SpO2 100 %.  LABORATORY DATA: Lab Results  Component Value Date   WBC 6.0 03/30/2015   HGB 13.1 03/30/2015   HCT 39.7 03/30/2015   MCV 88.8 03/30/2015   PLT 199 03/30/2015      Chemistry      Component Value Date/Time   NA 138 03/30/2015 1109   NA 147* 10/12/2014 0810   K 3.7 03/30/2015 1109   K 3.6 10/12/2014 0810   CL 110 10/12/2014 0810   CO2 27 03/30/2015 1109   CO2 29 10/12/2014 0810   BUN 13.9 03/30/2015 1109   BUN 17 10/12/2014 0810   CREATININE 1.1 03/30/2015 1109   CREATININE 1.09 10/12/2014 0810      Component Value Date/Time   CALCIUM 8.8 03/30/2015 1109   CALCIUM 8.8 10/12/2014 0810   ALKPHOS 86 03/30/2015 1109   ALKPHOS 70 10/08/2014 0655   AST 11 03/30/2015 1109   AST 27 10/08/2014 0655   ALT 7  03/30/2015 1109   ALT 21 10/08/2014 0655   BILITOT 0.49 03/30/2015 1109   BILITOT 0.5 10/08/2014 0655       RADIOGRAPHIC STUDIES: No results found. ASSESSMENT AND PLAN: This is a very pleasant 64 years old [place] American male with unresectable a stage IIA non-small cell lung cancer. He completed a course of concurrent chemoradiation with weekly carboplatin and paclitaxel is status post 7 cycles. Has been observation since his treatment in April 2015.  His  CT scan of the chest showed further enlargement of the left upper lobe suprahilar mass concerning for disease recurrence. He is currently being treated with systemic chemotherapy in the form of carboplatin for an AUC of 5 and paclitaxel 175 mg/m with Neulasta support given every 3 weeks, status post 3 cycles. Overall he is tolerating his chemotherapy relatively well the exception of some fatigue. Patient was discussed with and also seen by Dr. [name]. His most recent restaging CT scan of the chest shows evidence for disease progression. He is currently being treated with immunotherapy in the form of  Nivolumab 3 mg/kg given every 2 weeks, status post 2 cycles. He is tolerating this treatment without difficulty.  He will proceed with cycle #3  today as scheduled of immunotherapy. He will follow-up in 2 weeks prior to the start of cycle #4. He was advised to call immediately if he has any concerning symptoms in the interval. The patient voices understanding of current disease status and treatment options and is in agreement with the current care plan.  All questions were answered. The patient knows to call the clinic with any problems, questions or concerns. We can certainly see the patient much sooner if necessary.  Carlton Adam, PA-C 03/30/2015   ADDENDUM: Hematology/Oncology Attending: I had a face to face encounter with the patient. I recommended his care plan. This is a very pleasant 64 years old African-American male with  recurrent non-small cell lung cancer, squamous cell carcinoma who is currently undergoing treatment with immunotherapy with Nivolumab status post 2 cycles. History rating his treatment well with no significant adverse effects. The patient denied having any significant nausea or vomiting, no fever or chills. He has no diarrhea. He denied having any skin rash. We'll proceed with cycle #3 today as a scheduled. The patient would come back for follow-up visit in 2 weeks for reevaluation before starting cycle #4. He was advised to call immediately if he has any concerning symptoms in the interval.  Disclaimer: This note was dictated with voice recognition software. Similar sounding words can inadvertently be transcribed and may be missed upon review. Eilleen Kempf., MD 04/05/2015

## 2015-03-30 NOTE — Patient Instructions (Signed)
[place] Discharge Instructions for Patients Receiving Chemotherapy  Today you received the following chemotherapy agents: Nivolumab.  To help prevent nausea and vomiting after your treatment, we encourage you to take your nausea medication: Compazine 10 mg every 6 hours as needed.   If you develop nausea and vomiting that is not controlled by your nausea medication, call the clinic.   BELOW ARE SYMPTOMS THAT SHOULD BE REPORTED IMMEDIATELY:  *FEVER GREATER THAN 100.5 F  *CHILLS WITH OR WITHOUT FEVER  NAUSEA AND VOMITING THAT IS NOT CONTROLLED WITH YOUR NAUSEA MEDICATION  *UNUSUAL SHORTNESS OF BREATH  *UNUSUAL BRUISING OR BLEEDING  TENDERNESS IN MOUTH AND THROAT WITH OR WITHOUT PRESENCE OF ULCERS  *URINARY PROBLEMS  *BOWEL PROBLEMS  UNUSUAL RASH Items with * indicate a potential emergency and should be followed up as soon as possible.  Feel free to call the clinic you have any questions or concerns. The clinic phone number is [phone].  Please show the [place] at check-in to the Emergency Department and triage nurse.

## 2015-03-30 NOTE — Telephone Encounter (Signed)
Gave avs & calendar for August °

## 2015-03-31 NOTE — Patient Instructions (Signed)
Follow up in 2 weeks, prior to your next scheduled cycle of immunotherapy 

## 2015-04-05 ENCOUNTER — Encounter: Payer: Self-pay | Admitting: Physician Assistant

## 2015-04-05 DIAGNOSIS — Z5112 Encounter for antineoplastic immunotherapy: Secondary | ICD-10-CM

## 2015-04-05 DIAGNOSIS — Z7189 Other specified counseling: Secondary | ICD-10-CM | POA: Insufficient documentation

## 2015-04-05 HISTORY — DX: Encounter for antineoplastic immunotherapy: Z51.12

## 2015-04-12 ENCOUNTER — Telehealth: Payer: Self-pay | Admitting: Internal Medicine

## 2015-04-12 ENCOUNTER — Encounter: Payer: Self-pay | Admitting: Internal Medicine

## 2015-04-12 ENCOUNTER — Other Ambulatory Visit (HOSPITAL_BASED_OUTPATIENT_CLINIC_OR_DEPARTMENT_OTHER): Payer: Medicare Other

## 2015-04-12 ENCOUNTER — Ambulatory Visit (HOSPITAL_BASED_OUTPATIENT_CLINIC_OR_DEPARTMENT_OTHER): Payer: Medicare Other | Admitting: Internal Medicine

## 2015-04-12 ENCOUNTER — Ambulatory Visit (HOSPITAL_BASED_OUTPATIENT_CLINIC_OR_DEPARTMENT_OTHER): Payer: Medicare Other

## 2015-04-12 VITALS — BP 105/77 | HR 103 | Temp 97.4°F | Resp 18 | Ht 70.0 in | Wt 143.5 lb

## 2015-04-12 DIAGNOSIS — C3412 Malignant neoplasm of upper lobe, left bronchus or lung: Secondary | ICD-10-CM

## 2015-04-12 DIAGNOSIS — Z5112 Encounter for antineoplastic immunotherapy: Secondary | ICD-10-CM

## 2015-04-12 DIAGNOSIS — C3492 Malignant neoplasm of unspecified part of left bronchus or lung: Secondary | ICD-10-CM

## 2015-04-12 DIAGNOSIS — R5382 Chronic fatigue, unspecified: Secondary | ICD-10-CM

## 2015-04-12 DIAGNOSIS — R5383 Other fatigue: Secondary | ICD-10-CM | POA: Insufficient documentation

## 2015-04-12 LAB — COMPREHENSIVE METABOLIC PANEL (CC13)
ALT: 8 U/L (ref 0–55)
AST: 11 U/L (ref 5–34)
Albumin: 3.9 g/dL (ref 3.5–5.0)
Alkaline Phosphatase: 87 U/L (ref 40–150)
Anion Gap: 11 mEq/L (ref 3–11)
BUN: 13.8 mg/dL (ref 7.0–26.0)
CALCIUM: 9.6 mg/dL (ref 8.4–10.4)
CO2: 27 mEq/L (ref 22–29)
Chloride: 108 mEq/L (ref 98–109)
Creatinine: 1.1 mg/dL (ref 0.7–1.3)
EGFR: 82 mL/min/{1.73_m2} — ABNORMAL LOW (ref >90)
GLUCOSE: 88 mg/dL (ref 70–140)
Potassium: 3.6 mEq/L (ref 3.5–5.1)
SODIUM: 146 meq/L — AB (ref 136–145)
Total Bilirubin: 0.57 mg/dL (ref 0.20–1.20)
Total Protein: 7.5 g/dL (ref 6.4–8.3)

## 2015-04-12 LAB — CBC WITH DIFFERENTIAL/PLATELET
BASO%: 0.4 % (ref 0.0–2.0)
BASOS ABS: 0 10*3/uL (ref 0.0–0.1)
EOS%: 0.4 % (ref 0.0–7.0)
Eosinophils Absolute: 0 10*3/uL (ref 0.0–0.5)
HCT: 45 % (ref 38.4–49.9)
HEMOGLOBIN: 14.7 g/dL (ref 13.0–17.1)
LYMPH%: 15.7 % (ref 14.0–49.0)
MCH: 29.2 pg (ref 27.2–33.4)
MCHC: 32.7 g/dL (ref 32.0–36.0)
MCV: 89.1 fL (ref 79.3–98.0)
MONO#: 0.5 10*3/uL (ref 0.1–0.9)
MONO%: 7.5 % (ref 0.0–14.0)
NEUT#: 5.4 10*3/uL (ref 1.5–6.5)
NEUT%: 76 % — ABNORMAL HIGH (ref 39.0–75.0)
PLATELETS: 192 10*3/uL (ref 140–400)
RBC: 5.05 10*6/uL (ref 4.20–5.82)
RDW: 14.3 % (ref 11.0–14.6)
WBC: 7.1 10*3/uL (ref 4.0–10.3)
lymph#: 1.1 10*3/uL (ref 0.9–3.3)

## 2015-04-12 MED ORDER — NIVOLUMAB CHEMO INJECTION 100 MG/10ML
3.0000 mg/kg | Freq: Once | INTRAVENOUS | Status: AC
  Administered 2015-04-12: 200 mg via INTRAVENOUS
  Filled 2015-04-12: qty 20

## 2015-04-12 MED ORDER — SODIUM CHLORIDE 0.9 % IV SOLN
Freq: Once | INTRAVENOUS | Status: AC
  Administered 2015-04-12: 12:00:00 via INTRAVENOUS

## 2015-04-12 NOTE — Telephone Encounter (Signed)
Appointments made and avs will be printed in chemo

## 2015-04-12 NOTE — Progress Notes (Signed)
[facility] [phone]   [phone]  OFFICE PROGRESS NOTE  [name],[name], MD [address] [place] [address]  DIAGNOSIS: Stage IIA (T2a., N1, M0) non-small cell lung cancer, squamous cell carcinoma presented with central left upper lobe mass as well as hilar lymphadenopathy diagnosed in January of 2015.  PRIOR THERAPY:  1. Concurrent chemoradiation with weekly carboplatin for AUC of 2 and paclitaxel 45 mg/M2, status post 7 cycles. Last cycle was given 12/22/2013  2. Systemic chemotherapy with carboplatin for AUC of 5 and paclitaxel 175 MG/M2 with Neulasta support every 3 weeks. Status post 3 cycles  CURRENT THERAPY: Immunotherapy with Nivolumab 3 mg/kg given every 2 weeks. Status post 3 cycles.  INTERVAL HISTORY: Marco [name] 64 y.o. male returns to the clinic today for followup visit accompanied by his caregiver. He has been observation for the last 12 months and doing well with no complaints except for some urine urgency. He had a urinalysis as well as culture sensitivity and that were unremarkable. He denied having any nausea or vomiting, no fever or chills. He denied having any chest pain, shortness of breath, or hemoptysis. He denied having any significant weight loss or night sweats. He is tolerating his current treatment with immunotherapy fairly well and he is here today to start cycle #4.  MEDICAL HISTORY: Past Medical History  Diagnosis Date  . Mental disorder     sczizophrenia;moderate retardation  . Depression   . DEMENTIA   . Hypertension     no medications, no documented history per caregiver at preadmission  . GERD (gastroesophageal reflux disease)   . Seizures   . Developmental disability     developmentaly delayed  . Hx of radiation therapy 11/07/13-12/24/13    lung,60Gy/58f  . Mental retardation   . Seizure disorder   . Lung cancer   . Encounter for antineoplastic immunotherapy 04/05/2015     ALLERGIES:  has No Known Allergies.  MEDICATIONS:  Current Outpatient Prescriptions  Medication Sig Dispense Refill  . bisacodyl (DULCOLAX) 5 MG EC tablet Take 5 mg by mouth daily as needed for moderate constipation.    .Marland Kitchendonepezil (ARICEPT) 10 MG tablet Take 10 mg by mouth.    . fluticasone (FLONASE) 50 MCG/ACT nasal spray Place 2 sprays into both nostrils daily.    .Marland KitchenlevETIRAcetam (KEPPRA) 1000 MG tablet Take 1 tablet (1,000 mg total) by mouth 2 (two) times daily. 180 tablet 3  . LORazepam (ATIVAN) 1 MG tablet Take 2 mg by mouth at bedtime.    .Marland Kitchenomeprazole (PRILOSEC) 20 MG capsule Take 20 mg by mouth 2 (two) times daily.    . risperiDONE (RISPERDAL) 3 MG tablet Take 3 mg by mouth at bedtime.    . traZODone (DESYREL) 50 MG tablet Take 25 mg by mouth at bedtime. Pt takes 1/2 tablet at bedtime    . prochlorperazine (COMPAZINE) 10 MG tablet Take 1 tablet (10 mg total) by mouth every 6 (six) hours as needed for nausea or vomiting. (Patient not taking: Reported on 04/12/2015) 30 tablet 0   No current facility-administered medications for this visit.    SURGICAL HISTORY:  Past Surgical History  Procedure Laterality Date  . Cataract extraction w/phaco  07/12/2011    Procedure: CATARACT EXTRACTION PHACO AND INTRAOCULAR LENS PLACEMENT (IOC);  Surgeon: GAdonis Brook  [facility];  Service: Ophthalmology;  Laterality: Left;  .Marland KitchenEye surgery      L eye  .  Video bronchoscopy Bilateral 10/01/2013    Procedure: VIDEO BRONCHOSCOPY WITHOUT FLUORO;  Surgeon: Collene Gobble, MD;  Location: [facility];  Service: Cardiopulmonary;  Laterality: Bilateral;    REVIEW OF SYSTEMS:  A comprehensive review of systems was negative except for: Genitourinary: positive for urinary incontinence   PHYSICAL EXAMINATION: General appearance: alert, cooperative and no distress Head: Normocephalic, without obvious abnormality, atraumatic Neck: no adenopathy, no JVD, supple, symmetrical, trachea midline and thyroid  not enlarged, symmetric, no tenderness/mass/nodules Lymph nodes: Cervical, supraclavicular, and axillary nodes normal. Resp: clear to auscultation bilaterally Back: symmetric, no curvature. ROM normal. No CVA tenderness. Cardio: regular rate and rhythm, S1, S2 normal, no murmur, click, rub or gallop GI: soft, non-tender; bowel sounds normal; no masses,  no organomegaly Extremities: extremities normal, atraumatic, no cyanosis or edema Neurologic: Alert and oriented X 3, normal strength and tone. Normal symmetric reflexes. Normal coordination and gait  ECOG PERFORMANCE STATUS: 1 - Symptomatic but completely ambulatory  Blood pressure 105/77, pulse 103, temperature 97.4 F (36.3 C), temperature source Oral, resp. rate 18, height '5\' 10"'$  (1.778 m), weight 143 lb 8 oz (65.091 kg), SpO2 100 %.  LABORATORY DATA: Lab Results  Component Value Date   WBC 7.1 04/12/2015   HGB 14.7 04/12/2015   HCT 45.0 04/12/2015   MCV 89.1 04/12/2015   PLT 192 04/12/2015      Chemistry      Component Value Date/Time   NA 146* 04/12/2015 1033   NA 147* 10/12/2014 0810   K 3.6 04/12/2015 1033   K 3.6 10/12/2014 0810   CL 110 10/12/2014 0810   CO2 27 04/12/2015 1033   CO2 29 10/12/2014 0810   BUN 13.8 04/12/2015 1033   BUN 17 10/12/2014 0810   CREATININE 1.1 04/12/2015 1033   CREATININE 1.09 10/12/2014 0810      Component Value Date/Time   CALCIUM 9.6 04/12/2015 1033   CALCIUM 8.8 10/12/2014 0810   ALKPHOS 87 04/12/2015 1033   ALKPHOS 70 10/08/2014 0655   AST 11 04/12/2015 1033   AST 27 10/08/2014 0655   ALT 8 04/12/2015 1033   ALT 21 10/08/2014 0655   BILITOT 0.57 04/12/2015 1033   BILITOT 0.5 10/08/2014 0655       RADIOGRAPHIC STUDIES:  ASSESSMENT AND PLAN: This is a very pleasant 64 years old [place] American male with unresectable a stage IIA non-small cell lung cancer. He completed a course of concurrent chemoradiation with weekly carboplatin and paclitaxel is status post 7 cycles. Has  been observation since his treatment in April 2015.  His restaging CT scan of the chest showed further enlargement of the left upper lobe suprahilar mass concerning for disease recurrence. The patient was treated with 3 cycles of systemic chemotherapy was carboplatin and paclitaxel but unfortunately has evidence for disease progression. He was started on treatment with immunotherapy with Nivolumab status post 3 cycles and tolerating his treatment fairly well. We will proceed with cycle #4 today as a scheduled. The patient would come back for follow-up visit in 2 weeks for reevaluation after repeating CT scan of the chest for restaging of his disease. He was advised to call immediately if he has any concerning symptoms in the interval. The patient voices understanding of current disease status and treatment options and is in agreement with the current care plan.  All questions were answered. The patient knows to call the clinic with any problems, questions or concerns. We can certainly see the patient much sooner if necessary.  Disclaimer:  This note was dictated with voice recognition software. Similar sounding words can inadvertently be transcribed and may not be corrected upon review.

## 2015-04-12 NOTE — Patient Instructions (Signed)
[facility] Discharge Instructions for Patients Receiving Chemotherapy  Today you received the following chemotherapy agents Nivolumab  To help prevent nausea and vomiting after your treatment, we encourage you to take your nausea medication as needed   If you develop nausea and vomiting that is not controlled by your nausea medication, call the clinic.   BELOW ARE SYMPTOMS THAT SHOULD BE REPORTED IMMEDIATELY:  *FEVER GREATER THAN 100.5 F  *CHILLS WITH OR WITHOUT FEVER  NAUSEA AND VOMITING THAT IS NOT CONTROLLED WITH YOUR NAUSEA MEDICATION  *UNUSUAL SHORTNESS OF BREATH  *UNUSUAL BRUISING OR BLEEDING  TENDERNESS IN MOUTH AND THROAT WITH OR WITHOUT PRESENCE OF ULCERS  *URINARY PROBLEMS  *BOWEL PROBLEMS  UNUSUAL RASH Items with * indicate a potential emergency and should be followed up as soon as possible.  Feel free to call the clinic you have any questions or concerns. The clinic phone number is [phone].  Please show the CHEMO ALERT CARD at check-in to the Emergency Department and triage nurse.   

## 2015-04-13 ENCOUNTER — Ambulatory Visit: Payer: Medicare Other

## 2015-04-13 ENCOUNTER — Other Ambulatory Visit: Payer: Medicare Other

## 2015-04-17 ENCOUNTER — Emergency Department (HOSPITAL_COMMUNITY): Payer: Medicare Other

## 2015-04-17 ENCOUNTER — Inpatient Hospital Stay (HOSPITAL_COMMUNITY)
Admission: EM | Admit: 2015-04-17 | Discharge: 2015-04-22 | DRG: 100 | Disposition: A | Discharge status: Home Health | Payer: Medicare Other | Attending: Internal Medicine | Admitting: Internal Medicine

## 2015-04-17 ENCOUNTER — Inpatient Hospital Stay (HOSPITAL_COMMUNITY): Payer: Medicare Other

## 2015-04-17 ENCOUNTER — Encounter (HOSPITAL_COMMUNITY): Payer: Self-pay | Admitting: Family Medicine

## 2015-04-17 DIAGNOSIS — J9601 Acute respiratory failure with hypoxia: Secondary | ICD-10-CM | POA: Diagnosis not present

## 2015-04-17 DIAGNOSIS — G40909 Epilepsy, unspecified, not intractable, without status epilepticus: Secondary | ICD-10-CM | POA: Diagnosis not present

## 2015-04-17 DIAGNOSIS — C3490 Malignant neoplasm of unspecified part of unspecified bronchus or lung: Secondary | ICD-10-CM | POA: Diagnosis not present

## 2015-04-17 DIAGNOSIS — F79 Unspecified intellectual disabilities: Secondary | ICD-10-CM | POA: Diagnosis not present

## 2015-04-17 DIAGNOSIS — Z87891 Personal history of nicotine dependence: Secondary | ICD-10-CM

## 2015-04-17 DIAGNOSIS — G40901 Epilepsy, unspecified, not intractable, with status epilepticus: Secondary | ICD-10-CM | POA: Diagnosis present

## 2015-04-17 DIAGNOSIS — D72829 Elevated white blood cell count, unspecified: Secondary | ICD-10-CM | POA: Diagnosis not present

## 2015-04-17 DIAGNOSIS — R569 Unspecified convulsions: Secondary | ICD-10-CM | POA: Diagnosis not present

## 2015-04-17 DIAGNOSIS — Z9221 Personal history of antineoplastic chemotherapy: Secondary | ICD-10-CM | POA: Diagnosis not present

## 2015-04-17 DIAGNOSIS — F329 Major depressive disorder, single episode, unspecified: Secondary | ICD-10-CM | POA: Diagnosis present

## 2015-04-17 DIAGNOSIS — Z923 Personal history of irradiation: Secondary | ICD-10-CM | POA: Diagnosis not present

## 2015-04-17 DIAGNOSIS — E876 Hypokalemia: Secondary | ICD-10-CM | POA: Diagnosis present

## 2015-04-17 DIAGNOSIS — Z85118 Personal history of other malignant neoplasm of bronchus and lung: Secondary | ICD-10-CM

## 2015-04-17 DIAGNOSIS — I10 Essential (primary) hypertension: Secondary | ICD-10-CM | POA: Diagnosis not present

## 2015-04-17 DIAGNOSIS — K219 Gastro-esophageal reflux disease without esophagitis: Secondary | ICD-10-CM | POA: Diagnosis present

## 2015-04-17 DIAGNOSIS — Z79899 Other long term (current) drug therapy: Secondary | ICD-10-CM

## 2015-04-17 DIAGNOSIS — F0390 Unspecified dementia without behavioral disturbance: Secondary | ICD-10-CM | POA: Diagnosis present

## 2015-04-17 DIAGNOSIS — F89 Unspecified disorder of psychological development: Secondary | ICD-10-CM | POA: Diagnosis present

## 2015-04-17 DIAGNOSIS — C3492 Malignant neoplasm of unspecified part of left bronchus or lung: Secondary | ICD-10-CM | POA: Diagnosis present

## 2015-04-17 DIAGNOSIS — G40301 Generalized idiopathic epilepsy and epileptic syndromes, not intractable, with status epilepticus: Secondary | ICD-10-CM | POA: Diagnosis present

## 2015-04-17 LAB — CBC WITH DIFFERENTIAL/PLATELET
Basophils Absolute: 0 10*3/uL (ref 0.0–0.1)
Basophils Relative: 0 % (ref 0–1)
EOS ABS: 0 10*3/uL (ref 0.0–0.7)
Eosinophils Relative: 0 % (ref 0–5)
HCT: 40.5 % (ref 39.0–52.0)
HEMOGLOBIN: 13 g/dL (ref 13.0–17.0)
Lymphocytes Relative: 4 % — ABNORMAL LOW (ref 12–46)
Lymphs Abs: 0.5 10*3/uL — ABNORMAL LOW (ref 0.7–4.0)
MCH: 28.8 pg (ref 26.0–34.0)
MCHC: 32.1 g/dL (ref 30.0–36.0)
MCV: 89.8 fL (ref 78.0–100.0)
MONO ABS: 0.3 10*3/uL (ref 0.1–1.0)
Monocytes Relative: 3 % (ref 3–12)
NEUTROS ABS: 11.2 10*3/uL — AB (ref 1.7–7.7)
NEUTROS PCT: 93 % — AB (ref 43–77)
PLATELETS: 174 10*3/uL (ref 150–400)
RBC: 4.51 MIL/uL (ref 4.22–5.81)
RDW: 13.3 % (ref 11.5–15.5)
WBC: 12 10*3/uL — AB (ref 4.0–10.5)

## 2015-04-17 LAB — COMPREHENSIVE METABOLIC PANEL
ALK PHOS: 68 U/L (ref 38–126)
ALT: 9 U/L — AB (ref 17–63)
ANION GAP: 15 (ref 5–15)
AST: 30 U/L (ref 15–41)
Albumin: 3.5 g/dL (ref 3.5–5.0)
BUN: 12 mg/dL (ref 6–20)
CALCIUM: 8.6 mg/dL — AB (ref 8.9–10.3)
CHLORIDE: 107 mmol/L (ref 101–111)
CO2: 18 mmol/L — AB (ref 22–32)
CREATININE: 1.27 mg/dL — AB (ref 0.61–1.24)
GFR calc non Af Amer: 57 mL/min — ABNORMAL LOW (ref >60)
GLUCOSE: 146 mg/dL — AB (ref 65–99)
POTASSIUM: 4.1 mmol/L (ref 3.5–5.1)
SODIUM: 140 mmol/L (ref 135–145)
Total Bilirubin: 0.7 mg/dL (ref 0.3–1.2)
Total Protein: 6.4 g/dL — ABNORMAL LOW (ref 6.5–8.1)

## 2015-04-17 LAB — I-STAT ARTERIAL BLOOD GAS, ED
ACID-BASE DEFICIT: 1 mmol/L (ref 0.0–2.0)
Bicarbonate: 25.4 mEq/L — ABNORMAL HIGH (ref 20.0–24.0)
O2 SAT: 100 %
PO2 ART: 441 mmHg — AB (ref 80.0–100.0)
TCO2: 27 mmol/L (ref 0–100)
pCO2 arterial: 49.2 mmHg — ABNORMAL HIGH (ref 35.0–45.0)
pH, Arterial: 7.32 — ABNORMAL LOW (ref 7.350–7.450)

## 2015-04-17 LAB — URINALYSIS, ROUTINE W REFLEX MICROSCOPIC
BILIRUBIN URINE: NEGATIVE
GLUCOSE, UA: NEGATIVE mg/dL
Ketones, ur: NEGATIVE mg/dL
LEUKOCYTES UA: NEGATIVE
NITRITE: NEGATIVE
PROTEIN: 30 mg/dL — AB
SPECIFIC GRAVITY, URINE: 1.022 (ref 1.005–1.030)
Urobilinogen, UA: 0.2 mg/dL (ref 0.0–1.0)
pH: 5 (ref 5.0–8.0)

## 2015-04-17 LAB — CREATININE, SERUM
Creatinine, Ser: 1.17 mg/dL (ref 0.61–1.24)
GFR calc non Af Amer: >60 mL/min (ref >60)

## 2015-04-17 LAB — URINE MICROSCOPIC-ADD ON

## 2015-04-17 LAB — ETHANOL

## 2015-04-17 MED ORDER — PROPOFOL 1000 MG/100ML IV EMUL
INTRAVENOUS | Status: AC
  Filled 2015-04-17: qty 100

## 2015-04-17 MED ORDER — SUCCINYLCHOLINE CHLORIDE 20 MG/ML IJ SOLN
INTRAMUSCULAR | Status: AC
Start: 2015-04-17 — End: 2015-04-18
  Filled 2015-04-17: qty 1

## 2015-04-17 MED ORDER — LORAZEPAM 2 MG/ML IJ SOLN
2.0000 mg | Freq: Once | INTRAMUSCULAR | Status: AC
  Administered 2015-04-17: 2 mg via INTRAVENOUS

## 2015-04-17 MED ORDER — FENTANYL CITRATE (PF) 100 MCG/2ML IJ SOLN: 50.0000 ug | INTRAMUSCULAR | Status: DC | PRN

## 2015-04-17 MED ORDER — SODIUM CHLORIDE 0.9 % IV SOLN
1000.0000 mg | Freq: Once | INTRAVENOUS | Status: AC
  Administered 2015-04-17: 1000 mg via INTRAVENOUS
  Filled 2015-04-17: qty 10

## 2015-04-17 MED ORDER — LIDOCAINE HCL (CARDIAC) 20 MG/ML IV SOLN
INTRAVENOUS | Status: AC
  Filled 2015-04-17: qty 5

## 2015-04-17 MED ORDER — PROPOFOL 1000 MG/100ML IV EMUL
5.0000 ug/kg/min | Freq: Once | INTRAVENOUS | Status: AC
  Administered 2015-04-17: 5 ug/kg/min via INTRAVENOUS

## 2015-04-17 MED ORDER — ASPIRIN 81 MG PO CHEW: 324.0000 mg | CHEWABLE_TABLET | ORAL | Status: AC

## 2015-04-17 MED ORDER — SODIUM CHLORIDE 0.9 % IV SOLN: 250.0000 mL | INTRAVENOUS | Status: DC | PRN

## 2015-04-17 MED ORDER — LORAZEPAM 2 MG/ML IJ SOLN
INTRAMUSCULAR | Status: AC
  Filled 2015-04-17: qty 1

## 2015-04-17 MED ORDER — ETOMIDATE 2 MG/ML IV SOLN
INTRAVENOUS | Status: AC
  Filled 2015-04-17: qty 20

## 2015-04-17 MED ORDER — SODIUM CHLORIDE 0.9 % IV SOLN
1500.0000 mg | Freq: Two times a day (BID) | INTRAVENOUS | Status: DC
  Administered 2015-04-17 – 2015-04-19 (×4): 1500 mg via INTRAVENOUS
  Filled 2015-04-17 (×5): qty 15

## 2015-04-17 MED ORDER — PROPOFOL 1000 MG/100ML IV EMUL: 5.0000 ug/kg/min | Freq: Once | INTRAVENOUS | Status: DC

## 2015-04-17 MED ORDER — FAMOTIDINE IN NACL 20-0.9 MG/50ML-% IV SOLN: 20.0000 mg | Freq: Two times a day (BID) | INTRAVENOUS | Status: DC

## 2015-04-17 MED ORDER — GADOBENATE DIMEGLUMINE 529 MG/ML IV SOLN
10.0000 mL | Freq: Once | INTRAVENOUS | Status: AC | PRN
  Administered 2015-04-17: 10 mL via INTRAVENOUS

## 2015-04-17 MED ORDER — ETOMIDATE 2 MG/ML IV SOLN: 20.0000 mg | Freq: Once | INTRAVENOUS | Status: DC

## 2015-04-17 MED ORDER — SODIUM CHLORIDE 0.9 % IV SOLN
1000.0000 mg | Freq: Once | INTRAVENOUS | Status: DC
  Filled 2015-04-17: qty 10

## 2015-04-17 MED ORDER — SUCCINYLCHOLINE CHLORIDE 20 MG/ML IJ SOLN
120.0000 mg | Freq: Once | INTRAMUSCULAR | Status: DC
  Filled 2015-04-17: qty 6

## 2015-04-17 MED ORDER — ROCURONIUM BROMIDE 50 MG/5ML IV SOLN
INTRAVENOUS | Status: AC
  Filled 2015-04-17: qty 2

## 2015-04-17 MED ORDER — SODIUM CHLORIDE 0.9 % IV SOLN
INTRAVENOUS | Status: AC
  Administered 2015-04-18: 75 mL/h via INTRAVENOUS

## 2015-04-17 MED ORDER — HEPARIN SODIUM (PORCINE) 5000 UNIT/ML IJ SOLN
5000.0000 [IU] | Freq: Three times a day (TID) | INTRAMUSCULAR | Status: AC
  Administered 2015-04-18 – 2015-04-19 (×7): 5000 [IU] via SUBCUTANEOUS
  Filled 2015-04-17 (×8): qty 1

## 2015-04-17 MED ORDER — ASPIRIN 300 MG RE SUPP
300.0000 mg | RECTAL | Status: AC
  Administered 2015-04-17: 300 mg via RECTAL
  Filled 2015-04-17: qty 1

## 2015-04-17 MED ORDER — PROPOFOL 1000 MG/100ML IV EMUL
0.0000 ug/kg/min | INTRAVENOUS | Status: DC
Start: 2015-04-17 — End: 2015-04-18
  Administered 2015-04-18: 50 ug/kg/min via INTRAVENOUS
  Administered 2015-04-18: 25 ug/kg/min via INTRAVENOUS
  Filled 2015-04-17 (×2): qty 100

## 2015-04-17 NOTE — ED Notes (Signed)
Patient transported to MRI 

## 2015-04-17 NOTE — ED Provider Notes (Signed)
CSN: [id number]     Arrival date & time 04/17/15  1725 History   First MD Initiated Contact with Patient 04/17/15 1741     Chief Complaint  Patient presents with  . Seizures     (Consider location/radiation/quality/duration/timing/severity/associated sxs/prior Treatment) The history is provided by the EMS personnel and medical records. The history is limited by the condition of the patient.   Patient is a 64 year old male with past medical history seizure disorder, squamous cell lung cancer, mental retardation who presents today with seizures that started immediately prior to arrival. Per EMS patient was seen by family having what they describe as a grand mal seizure. The relates that he had 2 seizures that were back to back. EMS arrived and in route they note that the patient suddenly had 2 seizures as well. He was given midazolam with improvement in seizure activity. On arrival patient is unresponsive and breathing spontaneously. Unable to get further history due to patient's status.  Patient is notably her for seizure control. Unclear if he's been compliant with these medications however. Past Medical History  Diagnosis Date  . Mental disorder     sczizophrenia;moderate retardation  . Depression   . DEMENTIA   . Hypertension     no medications, no documented history per caregiver at preadmission  . GERD (gastroesophageal reflux disease)   . Seizures   . Developmental disability     developmentaly delayed  . Hx of radiation therapy 11/07/13-12/24/13    lung,60Gy/90f  . Mental retardation   . Seizure disorder   . Lung cancer   . Encounter for antineoplastic immunotherapy 04/05/2015   Past Surgical History  Procedure Laterality Date  . Cataract extraction w/phaco  07/12/2011    Procedure: CATARACT EXTRACTION PHACO AND INTRAOCULAR LENS PLACEMENT (IOC);  Surgeon: GAdonis Brook  [facility];  Service: Ophthalmology;  Laterality: Left;  .Marland KitchenEye surgery      L eye  . Video  bronchoscopy Bilateral 10/01/2013    Procedure: VIDEO BRONCHOSCOPY WITHOUT FLUORO;  Surgeon: RCollene Gobble MD;  [facility]  Service: Cardiopulmonary;  Laterality: Bilateral;   Family History  Problem Relation Age of Onset  . Hypertension Mother   . Hypertension Father    Social History  Substance Use Topics  . Smoking status: Former Smoker -- 0.50 packs/day for 35 years    Types: Cigarettes    Quit date: 09/07/2013  . Smokeless tobacco: None  . Alcohol Use: No    Review of Systems  Unable to perform ROS: Acuity of condition      Allergies  Review of patient's allergies indicates no known allergies.  Home Medications   Prior to Admission medications   Medication Sig Start Date End Date Taking? Authorizing Provider  bisacodyl (DULCOLAX) 5 MG EC tablet Take 5 mg by mouth daily as needed for moderate constipation (constipation).    Yes Historical Provider, MD  donepezil (ARICEPT) 10 MG tablet Take 10 mg by mouth at bedtime.    Yes Historical Provider, MD  fluticasone (FLONASE) 50 MCG/ACT nasal spray Place 2 sprays into both nostrils daily.   Yes Historical Provider, MD  levETIRAcetam (KEPPRA) 1000 MG tablet Take 1 tablet (1,000 mg total) by mouth 2 (two) times daily. 12/08/14  Yes AMelvenia Beam MD  LORazepam (ATIVAN) 1 MG tablet Take 2 mg by mouth at bedtime.   Yes Historical Provider, MD  omeprazole (PRILOSEC) 20 MG capsule Take 20 mg by mouth 2 (two) times daily.   Yes Historical  Provider, MD  risperiDONE (RISPERDAL) 3 MG tablet Take 3 mg by mouth at bedtime.   Yes Historical Provider, MD  traZODone (DESYREL) 50 MG tablet Take 25 mg by mouth at bedtime.    Yes Historical Provider, MD  prochlorperazine (COMPAZINE) 10 MG tablet Take 1 tablet (10 mg total) by mouth every 6 (six) hours as needed for nausea or vomiting. Patient not taking: Reported on 04/12/2015 12/22/14   Curt Bears, MD   BP 100/68 mmHg  Pulse 119  Temp(Src) 100.9 F (38.3 C)  Resp 24  SpO2  100% Physical Exam  Constitutional: He appears distressed. Face mask in place.  HENT:  Head: Normocephalic and atraumatic.  Eyes: Conjunctivae are normal. Pupils are equal, round, and reactive to light.  Neck: Normal range of motion. Neck supple.  Cardiovascular: Regular rhythm.  Tachycardia present.   Pulmonary/Chest: He is in respiratory distress (irregular breathing noted).  Abdominal: Soft. He exhibits no distension.  Musculoskeletal: Normal range of motion. He exhibits no edema.  Neurological: He is unresponsive. GCS eye subscore is 1. GCS verbal subscore is 1. GCS motor subscore is 1.  Skin: Skin is warm and dry. He is not diaphoretic. No erythema.  Nursing note and vitals reviewed.   ED Course  INTUBATION Date/Time: 04/17/2015 11:52 PM Performed by: Theodosia Quay Authorized by: Tanna Furry Consent: The procedure was performed in an emergent situation. Indications: airway protection Intubation method: direct Patient status: paralyzed (RSI) Preoxygenation: nonrebreather mask Sedatives: etomidate Paralytic: succinylcholine Laryngoscope size: Mac 3 Tube size: 7.5 mm Tube type: cuffed Number of attempts: 1 Cricoid pressure: yes Cords visualized: yes Post-procedure assessment: chest rise and CO2 detector Breath sounds: equal and absent over the epigastrium Cuff inflated: yes ETT to teeth: 24 cm Tube secured with: ETT holder Chest x-ray interpreted by me and radiologist. Chest x-ray findings: endotracheal tube in appropriate position Patient tolerance: Patient tolerated the procedure well with no immediate complications  CRITICAL CARE Performed by: Theodosia Quay Authorized by: Tanna Furry Total critical care time: 30 minutes Critical care was necessary to treat or prevent imminent or life-threatening deterioration of the following conditions: seizure. Critical care was time spent personally by me on the following activities: discussions with consultants, examination of  patient, review of old charts, ordering and review of radiographic studies and ordering and review of laboratory studies.   (including critical care time) Labs Review Labs Reviewed  CBC WITH DIFFERENTIAL/PLATELET - Abnormal; Notable for the following:    WBC 12.0 (*)    Neutrophils Relative % 93 (*)    Neutro Abs 11.2 (*)    Lymphocytes Relative 4 (*)    Lymphs Abs 0.5 (*)    All other components within normal limits  COMPREHENSIVE METABOLIC PANEL - Abnormal; Notable for the following:    CO2 18 (*)    Glucose, Bld 146 (*)    Creatinine, Ser 1.27 (*)    Calcium 8.6 (*)    Total Protein 6.4 (*)    ALT 9 (*)    GFR calc non Af Amer 57 (*)    All other components within normal limits  URINALYSIS, ROUTINE W REFLEX MICROSCOPIC (NOT AT [facility]) - Abnormal; Notable for the following:    APPearance CLOUDY (*)    Hgb urine dipstick TRACE (*)    Protein, ur 30 (*)    All other components within normal limits  URINE MICROSCOPIC-ADD ON - Abnormal; Notable for the following:    Casts HYALINE CASTS (*)    All other components  within normal limits  I-STAT ARTERIAL BLOOD GAS, ED - Abnormal; Notable for the following:    pH, Arterial 7.320 (*)    pCO2 arterial 49.2 (*)    pO2, Arterial 441.0 (*)    Bicarbonate 25.4 (*)    All other components within normal limits  CULTURE, BLOOD (ROUTINE X 2)  CULTURE, BLOOD (ROUTINE X 2)  ETHANOL  CREATININE, SERUM  CBC  BASIC METABOLIC PANEL  MAGNESIUM  PHOSPHORUS  TRIGLYCERIDES  CBC    Imaging Review Ct Head Wo Contrast  04/17/2015   CLINICAL DATA:  Seizure activity and unresponsiveness  EXAM: CT HEAD WITHOUT CONTRAST  TECHNIQUE: Contiguous axial images were obtained from the base of the skull through the vertex without intravenous contrast.  COMPARISON:  09/07/2013  FINDINGS: Bony calvarium is intact. Considerable cerumen is noted within the external auditory canals bilaterally. Mild atrophy is noted. No findings to suggest acute hemorrhage, acute  infarction or space-occupying mass lesion are noted.  IMPRESSION: Mild atrophic changes are noted.  No acute abnormality seen.   Electronically Signed   By: Inez Catalina M.D.   On: 04/17/2015 21:14   Dg Chest Port 1 View  04/17/2015   CLINICAL DATA:  Check endotracheal tube and nasogastric catheter placement  EXAM: PORTABLE CHEST - 1 VIEW  COMPARISON:  02/19/2015  FINDINGS: Cardiac shadow is within normal limits. Stable left upper lobe mass lesion is noted. An endotracheal tube is noted in satisfactory position 4.7 cm above the carina. A nasogastric catheter extends into the stomach. No other focal abnormality is seen.  IMPRESSION: Stable left upper lobe mass.  Tubes and lines as described.   Electronically Signed   By: Inez Catalina M.D.   On: 04/17/2015 18:28   I, Theodosia Quay, personally reviewed and evaluated these images and lab results as part of my medical decision-making.   EKG Interpretation None      MDM   Final diagnoses:  Status epilepticus    Patient arrives status post status epilepticus at this point appears to not be actively seizing although he did receive Versed in route by EMS. Patient GCS was 3 and was not obeying commands this point. Determined that intubation would be necessary to protect airway. This was performed by me as noted above. Did obtain a CT head without contrast which did not show any acute abnormalities of explain today's symptoms. Placement of ET tube was confirmed with chest x-ray. Given patient being on the ventilator will admit to critical care. Discussed with ICU physicians to are agreeable with this plan. Neurology was consult it and they recommend Keppra being given via IV otherwise no further recommendations. Patient was stable at the time of transfer on a propofol drip for sedation.    Theodosia Quay, MD 04/17/15 [place], MD 04/23/15 [phone]

## 2015-04-17 NOTE — Progress Notes (Addendum)
RT called to assist with intubation by ED Physician.  7.5 tube, Mac 3 blade, 1 attempt. Placed 23 at gums and secured with commercial tube holder.  Patient placed on vent: PRVC, VT 500, RR 14, 100%, Peep 5.

## 2015-04-17 NOTE — Progress Notes (Signed)
Pt was transported to MRI while on the ventilator with no complications.

## 2015-04-17 NOTE — Progress Notes (Signed)
Pt transported to CT while on the vent with no complications.

## 2015-04-17 NOTE — H&P (Signed)
PULMONARY / CRITICAL CARE MEDICINE   Name: Marco [name] MRN: [id number] DOB: March 01, 1951    ADMISSION DATE:  04/17/2015 CONSULTATION DATE:  04/17/15  REFERRING MD :  Dr Lavena Bullion, ED  CHIEF COMPLAINT:  seizures  INITIAL PRESENTATION: seizures  STUDIES:  See below  SIGNIFICANT EVENTS: Pt presented to ED with witnessed seizures.    HISTORY OF PRESENT ILLNESS:  Patient is a 64 year old African-American male with a past medical history significant for developmental disability, seizure disorder, dementia, hypertension, and Recurrent non-small cell lung cancer initially diagnosed as Stage IIA (T2a., N1, M0) non-small cell lung cancer who was witnessed having a seizure by someone at his group home. It is noted by EMS patient had 2 subsequent seizures around to the emergency department and was given 2.5 mg of diazepam intravenously. Per report he was unresponsive and needed to be bagged on arrival to the emergency department where he was subsequently intubated and placed on mechanical ventilation and started on a propofol drip. It is noted in his past medical history that he takes 1000 mg of Keppra twice a day. At this time I cannot obtain any further information regarding the patient as he is on a propofol drip and is intubated.  PAST MEDICAL HISTORY :   has a past medical history of Mental disorder; Depression; DEMENTIA; Hypertension; GERD (gastroesophageal reflux disease); Seizures; Developmental disability; radiation therapy (11/07/13-12/24/13); Mental retardation; Seizure disorder; Lung cancer; and Encounter for antineoplastic immunotherapy (04/05/2015).  has past surgical history that includes Cataract extraction w/PHACO (07/12/2011); Eye surgery; and Video bronchoscopy (Bilateral, 10/01/2013). Prior to Admission medications   Medication Sig Start Date End Date Taking? Authorizing Provider  bisacodyl (DULCOLAX) 5 MG EC tablet Take 5 mg by mouth daily as needed for moderate constipation  (constipation).    Yes Historical Provider, MD  donepezil (ARICEPT) 10 MG tablet Take 10 mg by mouth at bedtime.    Yes Historical Provider, MD  fluticasone (FLONASE) 50 MCG/ACT nasal spray Place 2 sprays into both nostrils daily.   Yes Historical Provider, MD  levETIRAcetam (KEPPRA) 1000 MG tablet Take 1 tablet (1,000 mg total) by mouth 2 (two) times daily. 12/08/14  Yes Melvenia Beam, MD  LORazepam (ATIVAN) 1 MG tablet Take 2 mg by mouth at bedtime.   Yes Historical Provider, MD  omeprazole (PRILOSEC) 20 MG capsule Take 20 mg by mouth 2 (two) times daily.   Yes Historical Provider, MD  risperiDONE (RISPERDAL) 3 MG tablet Take 3 mg by mouth at bedtime.   Yes Historical Provider, MD  traZODone (DESYREL) 50 MG tablet Take 25 mg by mouth at bedtime.    Yes Historical Provider, MD  prochlorperazine (COMPAZINE) 10 MG tablet Take 1 tablet (10 mg total) by mouth every 6 (six) hours as needed for nausea or vomiting. Patient not taking: Reported on 04/12/2015 12/22/14   Curt Bears, MD   No Known Allergies  FAMILY HISTORY:  indicated that his mother is deceased. He indicated that his father is deceased. He indicated that his sister is alive.  SOCIAL HISTORY:  reports that he quit smoking about 19 months ago. His smoking use included Cigarettes. He has a 17.5 pack-year smoking history. He does not have any smokeless tobacco history on file. He reports that he does not drink alcohol or use illicit drugs.  REVIEW OF SYSTEMS:  Unable to obtain as the patient is currently sedated on propofol and intubated   SUBJECTIVE:   Unable to obtain this information  VITAL SIGNS: Temp:  [98.6  F (37 C)-100.9 F (38.3 C)] 100.9 F (38.3 C) (08/13 2100) Pulse Rate:  [100-139] 119 (08/13 2100) Resp:  [12-33] 24 (08/13 2100) BP: (100-133)/(64-93) 100/68 mmHg (08/13 2100) SpO2:  [100 %] 100 % (08/13 2100) FiO2 (%):  [40 %-100 %] 40 % (08/13 1952) HEMODYNAMICS:   VENTILATOR SETTINGS: Vent Mode:  [-] PRVC FiO2  (%):  [40 %-100 %] 40 % Set Rate:  [14 bmp-16 bmp] 16 bmp Vt Set:  [500 mL-580 mL] 580 mL PEEP:  [5 cmH20] 5 cmH20 Plateau Pressure:  [16 cmH20-21 cmH20] 16 cmH20 INTAKE / OUTPUT: No intake or output data in the 24 hours ending 04/17/15 2106  PHYSICAL EXAMINATION: General:  64 year old african [place] male who appears older than stated age in no acute distress. He is sedated and intubated Neuro:  Neuro exam is altered due to the patient being on a propofol drip and having just received Ativan. He is not responsive to voice he grimaces with noxious stimuli to his 4 extremities. Pupils are equal and reactive to light although sluggish. Although I am unable to examine the patient's cranial nerves he does not have any gross asymmetry.  HEENT: Head, normocephalic, atraumatic. Ears symmetric, permeable. Eyes: no conjunctival icterus, no erythema. Nose: permeable, midline septum NGT present. Clear oropharynx  ETT present, poor dentition. Cardiovascular: S1S2 tachycardic no murmurs, rubs, or gallops auscultated. No thrills palpated.  Lungs:  Chest symmetrical with respirations, clear to auscultation bilaterally with no wheezing, crackles, equal expansion.  Abdomen:  Soft, nontender, no guarding, nondistended. Present bowel sounds. Musculoskeletal:  No muscle atrophy noted. ROM unable to assess. Gait was not assessed due to critical illness and mental status. No swelling nor tenderness of the joints.  Skin:  No notable scars, rashes, cruises. No bed sores noted.  Lymphatics: no palpable lymphadenopathy of cervical, supraclvicular nor inguinal areas.   LABS:  CBC  Recent Labs Lab 04/12/15 1033 04/17/15 1829  WBC 7.1 12.0*  HGB 14.7 13.0  HCT 45.0 40.5  PLT 192 174   Coag's No results for input(s): APTT, INR in the last 168 hours. BMET  Recent Labs Lab 04/12/15 1033 04/17/15 1829  NA 146* 140  K 3.6 4.1  CL  --  107  CO2 27 18*  BUN 13.8 12  CREATININE 1.1 1.27*  GLUCOSE 88 146*    Electrolytes  Recent Labs Lab 04/12/15 1033 04/17/15 1829  CALCIUM 9.6 8.6*   Sepsis Markers No results for input(s): LATICACIDVEN, PROCALCITON, O2SATVEN in the last 168 hours. ABG  Recent Labs Lab 04/17/15 1946  PHART 7.320*  PCO2ART 49.2*  PO2ART 441.0*   Liver Enzymes  Recent Labs Lab 04/12/15 1033 04/17/15 1829  AST 11 30  ALT 8 9*  ALKPHOS 87 68  BILITOT 0.57 0.7  ALBUMIN 3.9 3.5   Cardiac Enzymes No results for input(s): TROPONINI, PROBNP in the last 168 hours. Glucose No results for input(s): GLUCAP in the last 168 hours.  Imaging Dg Chest Port 1 View  04/17/2015   CLINICAL DATA:  Check endotracheal tube and nasogastric catheter placement  EXAM: PORTABLE CHEST - 1 VIEW  COMPARISON:  02/19/2015  FINDINGS: Cardiac shadow is within normal limits. Stable left upper lobe mass lesion is noted. An endotracheal tube is noted in satisfactory position 4.7 cm above the carina. A nasogastric catheter extends into the stomach. No other focal abnormality is seen.  IMPRESSION: Stable left upper lobe mass.  Tubes and lines as described.   Electronically Signed   By:  Inez Catalina M.D.   On: 04/17/2015 18:28     ASSESSMENT / PLAN:  PULMONARY OETT 8/13 A: Intubated for airway protection, acute respiratory failure Recurrent non-small cell lung cancer initially diagnosed as Stage IIA (T2a., N1, M0) non-small cell lung cancer, squamous cell carcinoma presented with central left upper lobe mass as well as hilar lymphadenopathy diagnosed in January of 2015.  He is currently being treated with immunotherapy in the form of Nivolumab 3 mg/kg given every 2 weeks, status post 4 cycles.  P:  Wean off of vent in AM and potential extubation. Vent management.  CARDIOVASCULAR CVL A: Hypertension P: We will hold the patient's home antihypertensives as he is currently normotensive on propofol.   RENAL A:   Renal function at baseline. No active issues. P:  monitor renal function.  Will provide normal saline at 75 cc an hour for 12 hours   GASTROINTESTINAL A:  No active issues. P:  Start enteral nutrition in the a.m. if patient remains intubated. GI prophylaxis with famotidine   HEMATOLOGIC A:  No active issues P:    INFECTIOUS A:  Leukocytosis, likely reactive, UA unremarkable.  P:  Check blood cultures x 2 to rule out any sort of infection. BCx2 pending UC not performed, UA unremarkable Abx: n/a  ENDOCRINE A:  No active issues   P:    NEUROLOGIC A:  Known Seizure disorder on keppra at home Developmental disability Dementia  P:  We appreciate neurology's recommendations and have noted their request to increase Keppra from 1000 to 1500 mg twice a day. We will also order the MRI of the brain with and without contrast to rule out metastasis. Hold tonight's dose of Aricept. Resume Ativan to avoid withdrawal  I have ordered the Keppra and the MRI brain with and without contrast.  RASS goal: 0 to -1  FAMILY  - Updates: no family at bedside  - Inter-disciplinary family meet or Palliative Care meeting due by:  day 7   Critical care time: 20 minutes   Randa Lynn, MD [place] Pager: [phone]  04/17/2015, 9:06 PM

## 2015-04-17 NOTE — ED Notes (Signed)
Pt. will go MRI before going to the ICU.

## 2015-04-17 NOTE — ED Notes (Signed)
Pt became increasingly agitated and moving around in bed, reaching for ET tube and catheter. Nurse and doctor notified.

## 2015-04-17 NOTE — Progress Notes (Addendum)
Chaplain responded to call for family support.  A Mr. Marco [name] accompanied the patient. Mr. Marco [name] is the group home manager of patient. The patient, who has MR, does not work, but lives in a group home setting.  The patient has been a client of Mr. Marco [name] for 3 years and Mr. Marco [name] feels very connected to the patient.  The closest relative is a sister in [name], [place].:  [name], [place]: [phone]  Mr. Marco [name] indicated that the sister has been contacted and informed of the general situation.  Mr. Marco [name] said that the pt can be bad-tempered, and he does not think the pt has religious inclinations.  He said that the patient had seized in a similar manner previously and was hospitalized "for a long time".  The patient was in bed when a caregiver (Mr. Marco [name] was not present at the time) noticed the patient begin to twitch.  The caregiver noted BP was 174/90, which was high (the patient runs low, according to Mr. Marco [name]), and shortly thereafter, the pt began to seize. While waiting for the doctor, Mr. Marco [name] fell asleep in the sub-waiting room as he was not feeling well.  He was anxious to talk to a doctor to learn more about his client's status.    Please call for f/u support as needed.  [name] [phone]   04/17/15 1740  Clinical Encounter Type  Visited With Other (Comment) (Group home manager)  Visit Type Initial;Psychological support;Follow-up;ED  Stress Factors  Family Stress Factors Exhausted;Lack of knowledge

## 2015-04-17 NOTE — ED Notes (Signed)
Pt from home family witnessed a seizure. Brief period of unresponsiveness and then another seizure. Pt had 2 in route PTA. Given versed---2.5 IV. Pt never regained consiousness . Pt being bagged upon arrival.

## 2015-04-17 NOTE — ED Notes (Signed)
[name], works with patient at group home, would like to be contacted as needed [phone]

## 2015-04-17 NOTE — Consult Note (Signed)
Admission H&P    Chief Complaint: Generalized seizures.  HPI: Marco [name] is an 64 y.o. male with a history of developmental disability, seizure disorder, non-small cell lung cancer, dementia and hypertension brought to the emergency room following a witnessed seizure at home. Patient had 2 subsequent seizures en route to the emergency room and was given 2.5 mg of Versed IV. He was unresponsive and being bagged on arrival in the emergency room. He was subsequently intubated and placed on mechanical ventilation. He was also started on propofol drip 30 mcg/kg/m. He takes Keppra 1000 mg twice a day. CT scan of his head is pending. His most recent imaging study was MRI on 10/09/2014 which showed left medial temporal/hippocampal restricted diffusion thought to be manifestations of postictal state. There is no documentation of CNS metastasis of lung cancer.  Past Medical History  Diagnosis Date  . Mental disorder     sczizophrenia;moderate retardation  . Depression   . DEMENTIA   . Hypertension     no medications, no documented history per caregiver at preadmission  . GERD (gastroesophageal reflux disease)   . Seizures   . Developmental disability     developmentaly delayed  . Hx of radiation therapy 11/07/13-12/24/13    lung,60Gy/66f  . Mental retardation   . Seizure disorder   . Lung cancer   . Encounter for antineoplastic immunotherapy 04/05/2015    Past Surgical History  Procedure Laterality Date  . Cataract extraction w/phaco  07/12/2011    Procedure: CATARACT EXTRACTION PHACO AND INTRAOCULAR LENS PLACEMENT (IOC);  Surgeon: GAdonis Brook  [facility];  Service: Ophthalmology;  Laterality: Left;  .Marland KitchenEye surgery      L eye  . Video bronchoscopy Bilateral 10/01/2013    Procedure: VIDEO BRONCHOSCOPY WITHOUT FLUORO;  Surgeon: RCollene Gobble MD;  [facility]  Service: Cardiopulmonary;  Laterality: Bilateral;    Family History  Problem Relation Age of Onset  . Hypertension  Mother   . Hypertension Father    Social History:  reports that he quit smoking about 19 months ago. His smoking use included Cigarettes. He has a 17.5 pack-year smoking history. He does not have any smokeless tobacco history on file. He reports that he does not drink alcohol or use illicit drugs.  Allergies: No Known Allergies  Medications: Patient's preadmission medications were reviewed by me.  ROS: Unavailable due to patient's mental status and no family members present.  Physical Examination: Blood pressure 111/76, pulse 118, resp. rate 23, SpO2 100 %.  HEENT-  Normocephalic, no lesions, without obvious abnormality.  Normal external eye and conjunctiva.  Normal TM's bilaterally.  Normal auditory canals and external ears. Normal external nose, mucus membranes and septum.  Normal pharynx. Neck supple with no masses, nodes, nodules or enlargement. Cardiovascular - regular rate and rhythm, S1, S2 normal, no murmur, click, rub or gallop Lungs - chest clear, no wheezing, rales, normal symmetric air entry Abdomen - soft, non-tender; bowel sounds normal; no masses,  no organomegaly Extremities - no joint deformities, effusion, or inflammation and no edema  Neurologic Examination: Patient was intubated and on mechanical ventilation as well as sedated with a fall. He had very minimal response with partial eye-opening with noxious stimuli. Pupils were equal and reacted sluggishly to light. Ocular movements were absent with oculocephalic maneuvers. No facial weakness was noted. Muscle tone was slightly increased diffusely with no abnormal posturing. Patient had withdrawal movements of lower extremities to noxious stimuli which were equal. He also had  equal spontaneous movements of upper extremities which were nonpurposeful. Deep tendon reflexes were plus and symmetrical. Motor responses were flexor bilaterally  No results found for this or any previous visit (from the past 48 hour(s)). Dg  Chest Port 1 View  04/17/2015   CLINICAL DATA:  Check endotracheal tube and nasogastric catheter placement  EXAM: PORTABLE CHEST - 1 VIEW  COMPARISON:  02/19/2015  FINDINGS: Cardiac shadow is within normal limits. Stable left upper lobe mass lesion is noted. An endotracheal tube is noted in satisfactory position 4.7 cm above the carina. A nasogastric catheter extends into the stomach. No other focal abnormality is seen.  IMPRESSION: Stable left upper lobe mass.  Tubes and lines as described.   Electronically Signed   By: Inez Catalina M.D.   On: 04/17/2015 18:28    Assessment/Plan 64 year old man with history of developmental delay and seizure disorder well as non-small cell cancer along, presenting with breakthrough generalized seizures. Exam showed no focal findings. CT scan of his head is pending.  Recommendations: 1. Keppra 1000 mg IV loading dose 2. Increase Keppra maintenance to 1500 mg every 12 hours 3. MRI of the brain without and with contrast to rule out metastasis  We will continue to follow this patient with you.  C.R. Nicole Kindred, MD [facility]  04/17/2015, 6:33 PM

## 2015-04-18 DIAGNOSIS — G40909 Epilepsy, unspecified, not intractable, without status epilepticus: Secondary | ICD-10-CM | POA: Diagnosis not present

## 2015-04-18 DIAGNOSIS — G40301 Generalized idiopathic epilepsy and epileptic syndromes, not intractable, with status epilepticus: Secondary | ICD-10-CM | POA: Diagnosis not present

## 2015-04-18 DIAGNOSIS — G40901 Epilepsy, unspecified, not intractable, with status epilepticus: Secondary | ICD-10-CM | POA: Diagnosis not present

## 2015-04-18 DIAGNOSIS — J9601 Acute respiratory failure with hypoxia: Secondary | ICD-10-CM | POA: Diagnosis present

## 2015-04-18 DIAGNOSIS — C3490 Malignant neoplasm of unspecified part of unspecified bronchus or lung: Secondary | ICD-10-CM | POA: Diagnosis present

## 2015-04-18 DIAGNOSIS — R569 Unspecified convulsions: Secondary | ICD-10-CM | POA: Diagnosis not present

## 2015-04-18 LAB — BASIC METABOLIC PANEL
Anion gap: 11 (ref 5–15)
BUN: 11 mg/dL (ref 6–20)
CHLORIDE: 109 mmol/L (ref 101–111)
CO2: 21 mmol/L — ABNORMAL LOW (ref 22–32)
CREATININE: 1.04 mg/dL (ref 0.61–1.24)
Calcium: 8.4 mg/dL — ABNORMAL LOW (ref 8.9–10.3)
GLUCOSE: 134 mg/dL — AB (ref 65–99)
Potassium: 3.1 mmol/L — ABNORMAL LOW (ref 3.5–5.1)
Sodium: 141 mmol/L (ref 135–145)

## 2015-04-18 LAB — CBC
HCT: 44.7 % (ref 39.0–52.0)
HCT: 40.5 % (ref 39.0–52.0)
HEMOGLOBIN: 14.1 g/dL (ref 13.0–17.0)
Hemoglobin: 13.5 g/dL (ref 13.0–17.0)
MCH: 28.6 pg (ref 26.0–34.0)
MCH: 30 pg (ref 26.0–34.0)
MCHC: 31.5 g/dL (ref 30.0–36.0)
MCHC: 33.3 g/dL (ref 30.0–36.0)
MCV: 90.7 fL (ref 78.0–100.0)
MCV: 90 fL (ref 78.0–100.0)
PLATELETS: 162 10*3/uL (ref 150–400)
PLATELETS: 171 10*3/uL (ref 150–400)
RBC: 4.93 MIL/uL (ref 4.22–5.81)
RBC: 4.5 MIL/uL (ref 4.22–5.81)
RDW: 13.5 % (ref 11.5–15.5)
RDW: 13.6 % (ref 11.5–15.5)
WBC: 11.2 10*3/uL — AB (ref 4.0–10.5)
WBC: 12.2 10*3/uL — ABNORMAL HIGH (ref 4.0–10.5)

## 2015-04-18 LAB — GLUCOSE, CAPILLARY
GLUCOSE-CAPILLARY: 70 mg/dL (ref 65–99)
GLUCOSE-CAPILLARY: 70 mg/dL (ref 65–99)

## 2015-04-18 LAB — MRSA PCR SCREENING: MRSA by PCR: NEGATIVE

## 2015-04-18 LAB — TRIGLYCERIDES: Triglycerides: 67 mg/dL (ref <150)

## 2015-04-18 LAB — PHOSPHORUS: Phosphorus: 2.2 mg/dL — ABNORMAL LOW (ref 2.5–4.6)

## 2015-04-18 LAB — MAGNESIUM: MAGNESIUM: 2 mg/dL (ref 1.7–2.4)

## 2015-04-18 MED ORDER — RISPERIDONE 3 MG PO TABS
3.0000 mg | ORAL_TABLET | Freq: Every day | ORAL | Status: DC
  Administered 2015-04-18 – 2015-04-21 (×4): 3 mg via ORAL
  Filled 2015-04-18 (×5): qty 1

## 2015-04-18 MED ORDER — LORAZEPAM 2 MG/ML IJ SOLN
INTRAMUSCULAR | Status: AC
  Filled 2015-04-18: qty 1

## 2015-04-18 MED ORDER — POTASSIUM PHOSPHATES 15 MMOLE/5ML IV SOLN
40.0000 meq | Freq: Once | INTRAVENOUS | Status: AC
  Administered 2015-04-18: 40 meq via INTRAVENOUS
  Filled 2015-04-18 (×3): qty 9.09

## 2015-04-18 MED ORDER — FAMOTIDINE IN NACL 20-0.9 MG/50ML-% IV SOLN
20.0000 mg | Freq: Two times a day (BID) | INTRAVENOUS | Status: DC
  Administered 2015-04-18 – 2015-04-19 (×4): 20 mg via INTRAVENOUS
  Filled 2015-04-18 (×5): qty 50

## 2015-04-18 MED ORDER — ANTISEPTIC ORAL RINSE SOLUTION (CORINZ)
7.0000 mL | Freq: Four times a day (QID) | OROMUCOSAL | Status: DC
  Administered 2015-04-18: 7 mL via OROMUCOSAL

## 2015-04-18 MED ORDER — DONEPEZIL HCL 10 MG PO TABS
10.0000 mg | ORAL_TABLET | Freq: Every day | ORAL | Status: DC
  Administered 2015-04-18 – 2015-04-21 (×4): 10 mg via ORAL
  Filled 2015-04-18 (×5): qty 1

## 2015-04-18 MED ORDER — LORAZEPAM 2 MG/ML IJ SOLN
1.0000 mg | Freq: Once | INTRAMUSCULAR | Status: AC
  Administered 2015-04-18: 1 mg via INTRAVENOUS

## 2015-04-18 MED ORDER — CHLORHEXIDINE GLUCONATE 0.12% ORAL RINSE (MEDLINE KIT)
15.0000 mL | Freq: Two times a day (BID) | OROMUCOSAL | Status: DC
  Administered 2015-04-18 (×2): 15 mL via OROMUCOSAL

## 2015-04-18 MED ORDER — VANCOMYCIN HCL 10 G IV SOLR
1500.0000 mg | Freq: Once | INTRAVENOUS | Status: AC
  Administered 2015-04-18: 1500 mg via INTRAVENOUS
  Filled 2015-04-18: qty 1500

## 2015-04-18 MED ORDER — LORAZEPAM 1 MG PO TABS
1.0000 mg | ORAL_TABLET | Freq: Every day | ORAL | Status: DC
  Administered 2015-04-18 – 2015-04-20 (×3): 1 mg via ORAL
  Filled 2015-04-18 (×3): qty 1

## 2015-04-18 MED ORDER — VANCOMYCIN HCL IN DEXTROSE 750-5 MG/150ML-% IV SOLN
750.0000 mg | Freq: Two times a day (BID) | INTRAVENOUS | Status: DC
  Administered 2015-04-19: 750 mg via INTRAVENOUS
  Filled 2015-04-18 (×2): qty 150

## 2015-04-18 MED ORDER — SODIUM CHLORIDE 0.9 % IV SOLN
INTRAVENOUS | Status: AC
  Administered 2015-04-18 (×2): via INTRAVENOUS

## 2015-04-18 MED ORDER — TRAZODONE 25 MG HALF TABLET
25.0000 mg | ORAL_TABLET | Freq: Every day | ORAL | Status: DC
  Administered 2015-04-18 – 2015-04-21 (×4): 25 mg via ORAL
  Filled 2015-04-18 (×5): qty 1

## 2015-04-18 MED ORDER — ACETAMINOPHEN 160 MG/5ML PO SOLN
500.0000 mg | ORAL | Status: DC | PRN
  Administered 2015-04-18 (×2): 500 mg via ORAL
  Filled 2015-04-18 (×2): qty 20.3

## 2015-04-18 NOTE — Progress Notes (Signed)
PULMONARY / CRITICAL CARE MEDICINE   Name: Marco [name] MRN: [id number] DOB: 22-Sep-1950    ADMISSION DATE:  04/17/2015 CONSULTATION DATE:  04/17/15  REFERRING MD :  Dr Lavena Bullion, ED  CHIEF COMPLAINT:  seizures  INITIAL PRESENTATION:  64 y/o M with PMH of developmental disability, seizure disorder, HTN, non-small cell lung CA who was admitted 8/13 with seizures.  Pt was intubated for airway protection and admitted to ICU.   STUDIES:  8/13  CT of Head >> mild atrophic changes, no acute abnormality  8/13  MRI Brain >> no acute intracranial process, no evidence of intracranial metastasis, mild to mod generalized cerebral atrophy  SIGNIFICANT EVENTS: 8/13  Pt presented to ED with witnessed seizures. Intubated, admit to ICU      SUBJECTIVE:  RN reports pt awake, alert.  Weaning on PSV   VITAL SIGNS: Temp:  [97.1 F (36.2 C)-102 F (38.9 C)] 97.1 F (36.2 C) (08/14 0900) Pulse Rate:  [100-139] 111 (08/14 0900) Resp:  [12-33] 25 (08/14 0900) BP: (76-133)/(56-93) 95/65 mmHg (08/14 0900) SpO2:  [100 %] 100 % (08/14 0900) FiO2 (%):  [40 %-100 %] 40 % (08/14 0816) Weight:  [143 lb 11.8 oz (65.2 kg)] 143 lb 11.8 oz (65.2 kg) (08/14 0500)   HEMODYNAMICS:     VENTILATOR SETTINGS: Vent Mode:  [-] PSV;CPAP FiO2 (%):  [40 %-100 %] 40 % Set Rate:  [14 bmp-16 bmp] 16 bmp Vt Set:  [500 mL-580 mL] 580 mL PEEP:  [5 cmH20] 5 cmH20 Pressure Support:  [5 cmH20] 5 cmH20 Plateau Pressure:  [16 cmH20-21 cmH20] 21 cmH20   INTAKE / OUTPUT:  Intake/Output Summary (Last 24 hours) at 04/18/15 0956 Last data filed at 04/18/15 0800  Gross per 24 hour  Intake 1235.24 ml  Output    560 ml  Net 675.24 ml    PHYSICAL EXAMINATION: General:  Adult male who appears older than stated age in no acute distress. He is sedated and intubated Neuro:  Awake, alert, follows commands, MAE  HEENT: Head, normocephalic, atraumatic. Eyes: no conjunctival icterus, no erythema.  ETT present, poor  dentition. Cardiovascular: S1S2 tachycardic no murmurs, rubs, or gallops auscultated. No thrills palpated.  Lungs:  Even / non-labored on PSV, lungs bilaterally clear  Abdomen:  Soft, nontender, no guarding, nondistended. Present bowel sounds. Musculoskeletal:  No muscle atrophy noted. No acute deformities  Skin:  No notable scars, rashes. Lymphatics: no palpable lymphadenopathy.   LABS:  CBC  Recent Labs Lab 04/17/15 1829 04/18/15 0123 04/18/15 0559  WBC 12.0* 11.2* 12.2*  HGB 13.0 14.1 13.5  HCT 40.5 44.7 40.5  PLT 174 162 171   Coag's No results for input(s): APTT, INR in the last 168 hours.   BMET  Recent Labs Lab 04/12/15 1033 04/17/15 1829 04/17/15 2153 04/18/15 0559  NA 146* 140  --  141  K 3.6 4.1  --  3.1*  CL  --  107  --  109  CO2 27 18*  --  21*  BUN 13.8 12  --  11  CREATININE 1.1 1.27* 1.17 1.04  GLUCOSE 88 146*  --  134*   Electrolytes  Recent Labs Lab 04/12/15 1033 04/17/15 1829 04/18/15 0559  CALCIUM 9.6 8.6* 8.4*  MG  --   --  2.0  PHOS  --   --  2.2*   Sepsis Markers No results for input(s): LATICACIDVEN, PROCALCITON, O2SATVEN in the last 168 hours.   ABG  Recent Labs Lab 04/17/15 1946  PHART  7.320*  PCO2ART 49.2*  PO2ART 441.0*   Liver Enzymes  Recent Labs Lab 04/12/15 1033 04/17/15 1829  AST 11 30  ALT 8 9*  ALKPHOS 87 68  BILITOT 0.57 0.7  ALBUMIN 3.9 3.5   Cardiac Enzymes No results for input(s): TROPONINI, PROBNP in the last 168 hours.   Glucose  Recent Labs Lab 04/18/15 0017  GLUCAP 70    Imaging Ct Head Wo Contrast  04/17/2015   CLINICAL DATA:  Seizure activity and unresponsiveness  EXAM: CT HEAD WITHOUT CONTRAST  TECHNIQUE: Contiguous axial images were obtained from the base of the skull through the vertex without intravenous contrast.  COMPARISON:  09/07/2013  FINDINGS: Bony calvarium is intact. Considerable cerumen is noted within the external auditory canals bilaterally. Mild atrophy is noted. No  findings to suggest acute hemorrhage, acute infarction or space-occupying mass lesion are noted.  IMPRESSION: Mild atrophic changes are noted.  No acute abnormality seen.   Electronically Signed   By: Inez Catalina M.D.   On: 04/17/2015 21:14   Mr Jeri Cos GY Contrast  04/18/2015   CLINICAL DATA:  Initial evaluation for acute seizure. History of recurrent non-small cell lung cancer. History of seizures.  EXAM: MRI HEAD WITHOUT AND WITH CONTRAST  TECHNIQUE: Multiplanar, multiecho pulse sequences of the brain and surrounding structures were obtained without and with intravenous contrast.  CONTRAST:  61m MULTIHANCE GADOBENATE DIMEGLUMINE 529 MG/ML IV SOLN  COMPARISON:  Prior CT from 04/17/2015 as well as MRI from 09/09/2013.  FINDINGS: Moderate cerebral volume loss and atrophy present. Minimal T2/FLAIR hyperintensity within the pons like related to chronic small vessel ischemic disease.  No abnormal foci of restricted diffusion to suggest ischemia or changes related to seizure. Gray-white matter differentiation maintained. Normal intravascular flow voids maintained. No acute intracranial hemorrhage.  No mass lesion, mass effect, or midline shift. No abnormal enhancement on post-contrast sequences. No hydrocephalus. No extra-axial fluid collection. Hippocampi are symmetric bilaterally with normal morphology and signal intensity.  Craniocervical junction within normal limits. Pituitary gland normal.  No acute abnormality about the orbits. Sequelae of prior lens extraction noted on the left.  Scattered mucosal thickening present within the paranasal sinuses. Fluid within the posterior nasopharynx. Patient is intubated. Scattered opacity present within the mastoid air cells bilaterally. Inner ear structures within normal limits.  Bone marrow signal intensity within normal limits. No scalp soft tissue abnormality.  IMPRESSION: 1. No acute intracranial process identified. No evidence for intracranial metastasis. 2. Mild to  moderate generalized cerebral atrophy.   Electronically Signed   By: BJeannine BogaM.D.   On: 04/18/2015 00:00   Dg Chest Port 1 View  04/17/2015   CLINICAL DATA:  Check endotracheal tube and nasogastric catheter placement  EXAM: PORTABLE CHEST - 1 VIEW  COMPARISON:  02/19/2015  FINDINGS: Cardiac shadow is within normal limits. Stable left upper lobe mass lesion is noted. An endotracheal tube is noted in satisfactory position 4.7 cm above the carina. A nasogastric catheter extends into the stomach. No other focal abnormality is seen.  IMPRESSION: Stable left upper lobe mass.  Tubes and lines as described.   Electronically Signed   By: MInez CatalinaM.D.   On: 04/17/2015 18:28     ASSESSMENT / PLAN:  PULMONARY OETT 8/13 >> 8/14  A:  Intubated for airway protection, Acute Respiratory Failure Non-small cell lung cancer - recurrent, initially diagnosed as Stage IIA (T2a., N1, M0) non-small cell lung cancer, squamous cell carcinoma presented with central left upper lobe mass  as well as hilar lymphadenopathy diagnosed in January of 2015. Currently being treated with immunotherapy in the form ofNivolumab 3 mg/kg given every 2 weeks, status post 4 cycles.  P:   Tolerating PSV wean, proceed with extubation  Pulmonary hygiene:  IS, mobilize Hold home flonase Will need outpatient follow up with ONC as previously arranged  CARDIOVASCULAR CVL A:  Hypertension P:  ICU monitoring post extubation  NOT on home medications according to med reconciliation   RENAL A:    Hypokalemia Hypophosphatemia   P:   Monitor BMP / UOP  Replace electrolytes as indicated NS at 50 ml/hr until reliable oral intake K Phos IV 8/14   GASTROINTESTINAL A:   GERD P:   NPO x 4 hours, if remains stable, advance diet as tolerated  Continue pepcid, on omeprazole at home   HEMATOLOGIC A:   No active issues P:  Monitor CBC DVT Prophylaxis: heparin sq   INFECTIOUS A:   Leukocytosis, likely reactive, UA  unremarkable.  P:   BCx2 8/13 >>  UA 8/13  >> neg   Monitor off abx  ENDOCRINE A:  No active issues   P:   Monitor glucose on BMP   NEUROLOGIC A:   Known Seizure disorder on keppra at home Developmental disability Dementia P:   Neurology consult  Keppra 1500 mg BID MRI of the brain negative for metastasis  Continue aricept, trazodone, risperdal  Continue home ativan at reduced dose, '1mg'$  QHS Seizure precautions   FAMILY  - Updates: no family at bedside  - Inter-disciplinary family meet or Palliative Care meeting due by:  day Osprey, NP-C [facility] Pgr: [phone] or if no answer [phone] 04/18/2015, 9:56 AM  Attending Note:  64 year old male with non-small cell lung cancer admitted on 8/13 with seizure.  He was intubated for airway protection and admitted to the ICU.  Patient had no further episodes of seizure overnight and done well during SBT.  Will extubate.  Baseline mental status is unknown.  Wheezing audible on exam.  Will titrate O2 down as able and monitor overnight in the ICU.  The patient is critically ill with multiple organ systems failure and requires high complexity decision making for assessment and support, frequent evaluation and titration of therapies, application of advanced monitoring technologies and extensive interpretation of multiple databases.   Critical Care Time devoted to patient care services described in this note is  35  Minutes. This time reflects time of care of this signee Dr Jennet Maduro. This critical care time does not reflect procedure time, or teaching time or supervisory time of PA/NP/Med student/Med Resident etc but could involve care discussion time.  Rush Farmer, M.D. [facility] Pulmonary/Critical Care Medicine. Pager: [phone]. After hours pager: [phone].

## 2015-04-18 NOTE — Progress Notes (Signed)
Received call from [facility], RN, regarding critical lab value + Micro in clusters and notified eMD Dr.Smith

## 2015-04-18 NOTE — ED Provider Notes (Addendum)
Pt seen upon arrival with Dr. Lavena Bullion.  History of seizures.  2 sz at home today, and a 3rd with EMS.  Given Versed, only 2.'5mg'$ , IV. Arrives Undergoing bag assisted ventilations. Intubated by Dr. Lavena Bullion with my direct supervision. Exam with Left eye deviation on arrival, but back to midline almost immediately.  No extremity stigmata of ongoing sz. Given IV Keppra, Propofol qtt, and PRN ativan. Seen by Dr. Nicole Kindred of Neurology. Critical care notified.  Pt seen in [place] Performed by: Tanna Furry JOSEPH   Total critical care time: 60 minutes.  Initial evaluation, intubation, management of Propofol drip and IV ativan for sedation. Multiple consultations.  Critical care time was exclusive of separately billable procedures and treating other patients.  Critical care was necessary to treat or prevent imminent or life-threatening deterioration.  Critical care was time spent personally by me on the following activities: development of treatment plan with patient and/or surrogate as well as nursing, discussions with consultants, evaluation of patient's response to treatment, examination of patient, obtaining history from patient or surrogate, ordering and performing treatments and interventions, ordering and review of laboratory studies, ordering and review of radiographic studies, pulse oximetry and re-evaluation of patient's condition.   Tanna Furry, MD 04/18/15 5320  Tanna Furry, MD 04/18/15 [phone]

## 2015-04-18 NOTE — Progress Notes (Signed)
Subjective: Patient has had no recurrence of seizure activity. He was successfully extubated earlier. Had no complaints.  Objective: Current vital signs: BP 95/65 mmHg  Pulse 111  Temp(Src) 97.1 F (36.2 C) (Oral)  Resp 25  Wt 65.2 kg (143 lb 11.8 oz)  SpO2 100%  Neurologic Exam: Patient was alert and in no acute distress. He was disoriented to time but oriented to place. Extraocular movements are reduced on right left lateral gaze. Face was symmetrical. Patient moved extremities equally symmetrical strength throughout. Coordination of extremities was normal.  MRI of the brain showed no acute intracranial abnormality, including no signs of metastasis.  Medications: I have reviewed the patient's current medications.  Assessment/Plan: 64 year old man with a history of mild mental delay and seizure disorder who was admitted on 04/17/2015 following recurrent breakthrough seizure activity, and intubated in the emergency room. He has since been extubated and has not had a recurrence of seizure activity since admission. Keppra was increased from 500 mg twice a day to 1000 mg twice a day.  I'm making no changes in management. We will plan to this patient in follow-up on an as-needed basis after this visit.  C.R. Nicole Kindred, MD [facility] [phone]  04/18/2015  11:06 AM

## 2015-04-18 NOTE — Procedures (Signed)
Extubation Procedure Note  Patient Details:   Name: Marco [name] DOB: [birth year] MRN: [id number]   Airway Documentation:     Evaluation  O2 sats: stable throughout Complications: No apparent complications Patient did tolerate procedure well. Bilateral Breath Sounds: Clear, Diminished Suctioning: Airway, Oral Yes   Prior to extubation positive cuff leak. Post extubation - bilateral BS, patient able to speak. No stridor noted.  [name] 04/18/2015, 9:05 AM

## 2015-04-18 NOTE — Progress Notes (Signed)
[place] Progress Note Patient Name: Marco [name] DOB: Dec 04, 1950 MRN: [id number]   Date of Service  04/18/2015  HPI/Events of Note  Positive blood culture with GPC in clusters.  Patient noted to have fevers in past 24 hrs and not on abx.  eICU Interventions  Start Vanc and follow culture results     Intervention Category Major Interventions: Infection - evaluation and management  JAEGER, TRUEHEART, P 04/18/2015, 6:12 PM

## 2015-04-18 NOTE — Progress Notes (Signed)
ANTIBIOTIC CONSULT NOTE - INITIAL  Pharmacy Consult for vancomycin Indication: bacteremia  No Known Allergies  Patient Measurements: Weight: 143 lb 11.8 oz (65.2 kg)  Vital Signs: Temp: 97.1 F (36.2 C) (08/14 0900) BP: 109/67 mmHg (08/14 1700) Pulse Rate: 103 (08/14 1800) Intake/Output from previous day: 08/13 0701 - 08/14 0700 In: 1110.6 [I.V.:1060.6; IV Piggyback:50] Out: 560 [Urine:510; Emesis/NG output:50] Intake/Output from this shift: Total I/O In: 1152.9 [I.V.:363.8; IV Piggyback:789.1] Out: 650 [Urine:650]  Labs:  Recent Labs  04/17/15 1829 04/17/15 2153 04/18/15 0123 04/18/15 0559  WBC 12.0*  --  11.2* 12.2*  HGB 13.0  --  14.1 13.5  PLT 174  --  162 171  CREATININE 1.27* 1.17  --  1.04   Estimated Creatinine Clearance: 64.4 mL/min (by C-G formula based on Cr of 1.04). No results for input(s): VANCOTROUGH, VANCOPEAK, VANCORANDOM, GENTTROUGH, GENTPEAK, GENTRANDOM, TOBRATROUGH, TOBRAPEAK, TOBRARND, AMIKACINPEAK, AMIKACINTROU, AMIKACIN in the last 72 hours.   Microbiology: Recent Results (from the past 720 hour(s))  Culture, blood (routine x 2)     Status: None (Preliminary result)   Collection Time: 04/17/15  9:42 PM  Result Value Ref Range Status   Specimen Description BLOOD RIGHT HAND  Final   Special Requests BOTTLES DRAWN AEROBIC ONLY 2CC  Final   Culture NO GROWTH < 24 HOURS  Final   Report Status PENDING  Incomplete  Culture, blood (routine x 2)     Status: None (Preliminary result)   Collection Time: 04/17/15  9:54 PM  Result Value Ref Range Status   Specimen Description BLOOD LEFT HAND  Final   Special Requests BOTTLES DRAWN AEROBIC ONLY 2CC  Final   Culture  Setup Time   Final    GRAM POSITIVE COCCI IN CLUSTERS AEROBIC BOTTLE ONLY CRITICAL RESULT CALLED TO, READ BACK BY AND VERIFIED WITH: CLAUDIO,R RN 04/18/15 1800 [place] BY M CAMPBELL    Culture NO GROWTH < 24 HOURS  Final   Report Status PENDING  Incomplete  MRSA PCR  Screening     Status: None   Collection Time: 04/18/15  1:25 AM  Result Value Ref Range Status   MRSA by PCR NEGATIVE NEGATIVE Final    Comment:        The GeneXpert MRSA Assay (FDA approved for NASAL specimens only), is one component of a comprehensive MRSA colonization surveillance program. It is not intended to diagnose MRSA infection nor to guide or monitor treatment for MRSA infections.    Assessment: Admit Complaint: witnessed seizure x2 PTA, x2 en route  Infectious Disease: Tmax 101, WBC 12.2 - monitor off abx, obtain cx  Vancomycin 8/14>>  8/13 BCx x 2 - 1/2 GPC 8/14 MRSA PCR - negative  Nephrology: SCr improved to 1.04, CrCL 64 ml/min, K+ 3.1, Phos 2.2 (KPhos 1mq ordered) - NS at 50 ml/hr  Pulmonary: intubated in ED 8/13, extubated and on 4L [place]  Goal of Therapy:  Vancomycin trough level 15-20 mcg/ml  Plan:  - Vancomycin 1500 mg x 1, then 750 mg q12h - Monitor clinical status, f/u with d/c if results of BC are CONS - VT prn if not a contaminant   MLevester Fresh PharmD, BCPS Clinical Pharmacist Pager [phone]/14/2016 6:19 PM

## 2015-04-18 NOTE — Progress Notes (Signed)
Propofol stopped at 0804 for weaning, patient is following commands with NP in the room. Order given to extubate patient, will monitor closely.  Desmond Dike RN

## 2015-04-18 NOTE — Progress Notes (Signed)
E-link RN notified of positive blood cultures Aerobic bottle= gram positive cocci in clusters, to notify Dr. Tamala Julian of results.  Desmond Dike RN

## 2015-04-19 DIAGNOSIS — R569 Unspecified convulsions: Secondary | ICD-10-CM | POA: Diagnosis not present

## 2015-04-19 DIAGNOSIS — F0390 Unspecified dementia without behavioral disturbance: Secondary | ICD-10-CM | POA: Diagnosis not present

## 2015-04-19 DIAGNOSIS — J9601 Acute respiratory failure with hypoxia: Secondary | ICD-10-CM | POA: Diagnosis not present

## 2015-04-19 DIAGNOSIS — C3490 Malignant neoplasm of unspecified part of unspecified bronchus or lung: Secondary | ICD-10-CM | POA: Diagnosis not present

## 2015-04-19 DIAGNOSIS — G40901 Epilepsy, unspecified, not intractable, with status epilepticus: Secondary | ICD-10-CM | POA: Diagnosis not present

## 2015-04-19 DIAGNOSIS — G40301 Generalized idiopathic epilepsy and epileptic syndromes, not intractable, with status epilepticus: Secondary | ICD-10-CM | POA: Diagnosis not present

## 2015-04-19 LAB — BASIC METABOLIC PANEL
ANION GAP: 9 (ref 5–15)
BUN: 5 mg/dL — ABNORMAL LOW (ref 6–20)
CALCIUM: 8.3 mg/dL — AB (ref 8.9–10.3)
CO2: 23 mmol/L (ref 22–32)
CREATININE: 0.9 mg/dL (ref 0.61–1.24)
Chloride: 109 mmol/L (ref 101–111)
Glucose, Bld: 94 mg/dL (ref 65–99)
Potassium: 3.3 mmol/L — ABNORMAL LOW (ref 3.5–5.1)
SODIUM: 141 mmol/L (ref 135–145)

## 2015-04-19 LAB — GLUCOSE, CAPILLARY
GLUCOSE-CAPILLARY: 93 mg/dL (ref 65–99)
GLUCOSE-CAPILLARY: 114 mg/dL — AB (ref 65–99)
Glucose-Capillary: 100 mg/dL — ABNORMAL HIGH (ref 65–99)
Glucose-Capillary: 102 mg/dL — ABNORMAL HIGH (ref 65–99)
Glucose-Capillary: 89 mg/dL (ref 65–99)
Glucose-Capillary: 96 mg/dL (ref 65–99)
Glucose-Capillary: 96 mg/dL (ref 65–99)

## 2015-04-19 LAB — CBC
HCT: 38.2 % — ABNORMAL LOW (ref 39.0–52.0)
HEMOGLOBIN: 12.5 g/dL — AB (ref 13.0–17.0)
MCH: 29.3 pg (ref 26.0–34.0)
MCHC: 32.7 g/dL (ref 30.0–36.0)
MCV: 89.5 fL (ref 78.0–100.0)
PLATELETS: 142 10*3/uL — AB (ref 150–400)
RBC: 4.27 MIL/uL (ref 4.22–5.81)
RDW: 13.4 % (ref 11.5–15.5)
WBC: 10.3 10*3/uL (ref 4.0–10.5)

## 2015-04-19 LAB — MAGNESIUM: MAGNESIUM: 1.9 mg/dL (ref 1.7–2.4)

## 2015-04-19 LAB — PHOSPHORUS: PHOSPHORUS: 2.2 mg/dL — AB (ref 2.5–4.6)

## 2015-04-19 MED ORDER — LEVETIRACETAM 750 MG PO TABS
1500.0000 mg | ORAL_TABLET | Freq: Two times a day (BID) | ORAL | Status: DC
  Administered 2015-04-19 – 2015-04-22 (×6): 1500 mg via ORAL
  Filled 2015-04-19 (×7): qty 2

## 2015-04-19 MED ORDER — LORAZEPAM 2 MG/ML IJ SOLN
INTRAMUSCULAR | Status: AC
  Filled 2015-04-19: qty 1

## 2015-04-19 MED ORDER — PANTOPRAZOLE SODIUM 40 MG PO TBEC
40.0000 mg | DELAYED_RELEASE_TABLET | Freq: Every day | ORAL | Status: DC
  Administered 2015-04-20 – 2015-04-22 (×3): 40 mg via ORAL
  Filled 2015-04-19 (×3): qty 1

## 2015-04-19 MED ORDER — ENOXAPARIN SODIUM 40 MG/0.4ML ~~LOC~~ SOLN
40.0000 mg | SUBCUTANEOUS | Status: DC
  Administered 2015-04-20 – 2015-04-22 (×3): 40 mg via SUBCUTANEOUS
  Filled 2015-04-19 (×4): qty 0.4

## 2015-04-19 MED ORDER — K PHOS MONO-SOD PHOS DI & MONO 155-852-130 MG PO TABS
250.0000 mg | ORAL_TABLET | Freq: Once | ORAL | Status: AC
  Administered 2015-04-19: 250 mg via ORAL
  Filled 2015-04-19: qty 1

## 2015-04-19 MED ORDER — LORAZEPAM 2 MG/ML IJ SOLN: 0.5000 mg | Freq: Once | INTRAMUSCULAR | Status: AC

## 2015-04-19 NOTE — Evaluation (Signed)
Physical Therapy Evaluation Patient Details Name: Marco [name] MRN: [id number] DOB: 02-28-1951 Today's Date: 04/19/2015   History of Present Illness  Patient is a 64 year old African-American male with a past medical history significant for developmental disability, seizure disorder, dementia, hypertension, and Recurrent non-small cell lung cancer initially diagnosed as Stage IIA (T2a., N1, M0) non-small cell lung cancer who was witnessed having a seizure by someone at his group home.  Developed respiratory failure requiring intubation, extubated 04/18/15.  Clinical Impression   Patient presents with decreased independence with mobility due to deficits listed in PT problem list.  He will benefit from skilled PT in the acute setting to allow hopeful return to group home at d/c.  Will further assist if increased level of care needed at d/c when pt more alert and able to participate with ambulation.  Currently limited by lethargy.    Follow Up Recommendations Home health PT;Supervision/Assistance - 24 hour    Equipment Recommendations  Other (comment) (TBA)    Recommendations for Other Services       Precautions / Restrictions Precautions Precautions: Fall Precaution Comments: mitt restraints Restrictions Weight Bearing Restrictions: No      Mobility  Bed Mobility Overal bed mobility: Needs Assistance Bed Mobility: Supine to Sit     Supine to sit: Min assist;Mod assist     General bed mobility comments: lifting assist for trunk, pt pulls up to sit with help  Transfers Overall transfer level: Needs assistance   Transfers: Sit to/from Stand;Stand Pivot Transfers Sit to Stand: Min assist Stand pivot transfers: Min assist;Mod assist;+2 safety/equipment       General transfer comment: bilat HHA to pivot to chair, pt stood good amout of time with support due to posterior loss of balance (about 2 minutes)  Ambulation/Gait             General Gait Details: NT due to  lethargy  Stairs            Wheelchair Mobility    Modified Rankin (Stroke Patients Only)       Balance Overall balance assessment: Needs assistance Sitting-balance support: Feet supported Sitting balance-Leahy Scale: Fair   Postural control: Posterior lean   Standing balance-Leahy Scale: Poor                               Pertinent Vitals/Pain Pain Assessment: No/denies pain    Home Living Family/patient expects to be discharged to:: Group home Living Arrangements: Group Home               Additional Comments: previous notes indicate caregivers present at group home, pt has to climb a flight of stairs    Prior Function Level of Independence: Independent;Needs assistance   Gait / Transfers Assistance Needed: independent     Comments: patient had supervision/ assist for higher cognitive tasks, but was able to mobilize and perform various aspects of self care independently (from previous notes)     Hand Dominance        Extremity/Trunk Assessment   Upper Extremity Assessment: Generalized weakness           Lower Extremity Assessment: Generalized weakness         Communication   Communication: Expressive difficulties (limited due to lethargy)  Cognition Arousal/Alertness: Lethargic Behavior During Therapy: Flat affect Overall Cognitive Status: No family/caregiver present to determine baseline cognitive functioning  General Comments      Exercises        Assessment/Plan    PT Assessment Patient needs continued PT services  PT Diagnosis Generalized weakness;Abnormality of gait   PT Problem List Decreased strength;Decreased activity tolerance;Decreased mobility;Decreased safety awareness;Decreased knowledge of precautions;Decreased knowledge of use of DME;Decreased balance  PT Treatment Interventions DME instruction;Functional mobility training;Balance training;Patient/family education;Gait  training;Therapeutic activities;Stair training;Therapeutic exercise   PT Goals (Current goals can be found in the Care Plan section) Acute Rehab PT Goals Patient Stated Goal: Unable to state PT Goal Formulation: Patient unable to participate in goal setting Time For Goal Achievement: 05/03/15 Potential to Achieve Goals: Good    Frequency Min 3X/week   Barriers to discharge        Co-evaluation               End of Session Equipment Utilized During Treatment: Gait belt Activity Tolerance: Patient tolerated treatment well Patient left: in chair;with nursing/sitter in room Nurse Communication: Mobility status         Time: 0922-0939 PT Time Calculation (min) (ACUTE ONLY): 17 min   Charges:   PT Evaluation $Initial PT Evaluation Tier I: 1 Procedure     PT G Codes:        WYNN,CYNDI 16-May-2015, 11:19 AM  Magda Kiel, PT [phone] 05/16/15

## 2015-04-19 NOTE — Progress Notes (Signed)
[facility] [place] Progress Note Patient Name: Marco [name] DOB: [birth year] MRN: [id number]   Date of Service  04/19/2015  HPI/Events of Note  Hypokalemia & low phosphorus.  eICU Interventions  Kphos '250mg'$  po x1     Intervention Category Major Interventions: Electrolyte abnormality - evaluation and management  Tera Partridge 04/19/2015, 6:17 AM

## 2015-04-19 NOTE — Progress Notes (Signed)
eLink Physician-Brief Progress Note Patient Name: Marco [name] DOB: 01/01/1951 MRN: [id number]   Date of Service  04/19/2015  HPI/Events of Note  Pt awake & combative toward staff. Extubated 8/14 AM.  eICU Interventions  Ativan 0.'5mg'$  IV once. Checking EKG to monitor QTc for Haldol.     Intervention Category Major Interventions: Delirium, psychosis, severe agitation - evaluation and management  Tera Partridge 04/19/2015, 2:59 AM

## 2015-04-19 NOTE — Progress Notes (Signed)
[place] Progress Note Patient Name: Marco [name] DOB: 1951-06-12 MRN: [id number]   Date of Service  04/19/2015  HPI/Events of Note  Delirium, combative and climbing OOB.  eICU Interventions  Will order bilateral wrist and soft waist belt restraints.      Intervention Category Major Interventions: Delirium, psychosis, severe agitation - evaluation and management  Athaliah Baumbach [place] 04/19/2015, 9:38 PM

## 2015-04-19 NOTE — Progress Notes (Signed)
PULMONARY / CRITICAL CARE MEDICINE   Name: Marco [name] MRN: [id number] DOB: Jan 24, 1951    ADMISSION DATE:  04/17/2015 CONSULTATION DATE:  04/17/15  REFERRING MD :  Dr Lavena Bullion, ED  CHIEF COMPLAINT:  seizures  INITIAL PRESENTATION:  64 y/o M with PMH of developmental disability, seizure disorder, HTN, non-small cell lung CA who was admitted 8/13 with seizures.  Pt was intubated for airway protection and admitted to ICU.   STUDIES:  8/13  CT of Head >> mild atrophic changes, no acute abnormality  8/13  MRI Brain >> no acute intracranial process, no evidence of intracranial metastasis, mild to mod generalized cerebral atrophy  SIGNIFICANT EVENTS: 8/13  Pt presented to ED with witnessed seizures. Intubated, admit to ICU   8/14 - extubated  SUBJECTIVE:  No seziure, no distress no vent  VITAL SIGNS: Temp:  [97.2 F (36.2 C)-100.4 F (38 C)] 98.9 F (37.2 C) (08/15 1219) Pulse Rate:  [99-119] 104 (08/15 1400) Resp:  [16-26] 25 (08/15 1400) BP: (98-130)/(62-79) 108/76 mmHg (08/15 1400) SpO2:  [96 %-100 %] 98 % (08/15 1400) Weight:  [67.6 kg (149 lb 0.5 oz)] 67.6 kg (149 lb 0.5 oz) (08/15 0459)   HEMODYNAMICS:     VENTILATOR SETTINGS:     INTAKE / OUTPUT:  Intake/Output Summary (Last 24 hours) at 04/19/15 1422 Last data filed at 04/19/15 1400  Gross per 24 hour  Intake   2250 ml  Output   1175 ml  Net   1075 ml    PHYSICAL EXAMINATION: General: awake, some agitation, alert Neuro: moves arm legs to command HEENT: jvd wnl PULM: CTA CV: s1 s2 RRR mild int tachy  GI: soft, bs wnl, nor Extremities: no edema   LABS:  CBC  Recent Labs Lab 04/18/15 0123 04/18/15 0559 04/19/15 0246  WBC 11.2* 12.2* 10.3  HGB 14.1 13.5 12.5*  HCT 44.7 40.5 38.2*  PLT 162 171 142*   Coag's No results for input(s): APTT, INR in the last 168 hours.   BMET  Recent Labs Lab 04/17/15 1829 04/17/15 2153 04/18/15 0559 04/19/15 0246  NA 140  --  141 141  K 4.1  --  3.1* 3.3*   CL 107  --  109 109  CO2 18*  --  21* 23  BUN 12  --  11 5*  CREATININE 1.27* 1.17 1.04 0.90  GLUCOSE 146*  --  134* 94   Electrolytes  Recent Labs Lab 04/17/15 1829 04/18/15 0559 04/19/15 0246  CALCIUM 8.6* 8.4* 8.3*  MG  --  2.0 1.9  PHOS  --  2.2* 2.2*   Sepsis Markers No results for input(s): LATICACIDVEN, PROCALCITON, O2SATVEN in the last 168 hours.   ABG  Recent Labs Lab 04/17/15 1946  PHART 7.320*  PCO2ART 49.2*  PO2ART 441.0*   Liver Enzymes  Recent Labs Lab 04/17/15 1829  AST 30  ALT 9*  ALKPHOS 68  BILITOT 0.7  ALBUMIN 3.5   Cardiac Enzymes No results for input(s): TROPONINI, PROBNP in the last 168 hours.   Glucose  Recent Labs Lab 04/18/15 0017 04/18/15 2016 04/19/15 0030 04/19/15 0344 04/19/15 0745 04/19/15 1210  GLUCAP 70 70 102* 89 100* 96    Imaging No results found.   ASSESSMENT / PLAN:  PULMONARY OETT 8/13 >> 8/14  A:  Intubated for airway protection, Acute Respiratory Failure Non-small cell lung cancer - recurrent, initially diagnosed as Stage IIA (T2a., N1, M0) non-small cell lung cancer, squamous cell carcinoma presented with central left  upper lobe mass as well as hilar lymphadenopathy diagnosed in January of 2015. Currently being treated with immunotherapy in the form ofNivolumab 3 mg/kg given every 2 weeks, status post 4 cycles.  P:   IS as able uproght  CARDIOVASCULAR A:  Hypertension Tachy mild P:  Dc tele Treat agitation   RENAL A:    Hypokalemia Hypophosphatemia   P:   k-phos bmet in am  kvo  GASTROINTESTINAL A:   GERD P:   Advance diet  HEMATOLOGIC A:   No active issues P:  Monitor CBC DVT Prophylaxis: heparin sq, dc when walking Change to lovenox with h/o cancer  Limit phlebtoomy  INFECTIOUS A:   Coag neg staph contamination P:   BCx2 8/13 >>  UA 8/13  >> neg   Monitor off abx, dc vanc  ENDOCRINE A:  No active issues   P:   Monitor glucose on BMP   NEUROLOGIC A:    Known Seizure disorder on keppra at home Developmental disability Dementia P:   Keppra per neuro MRI of the brain negative for metastasis  Continue aricept, trazodone, risperdal  Continue home ativan at reduced dose, '1mg'$  QHS Seizure precautions   FAMILY  - Updates: no family at bedside  - Inter-disciplinary family meet or Palliative Care meeting due by:  day 7  To floor, sitter  Lavon Paganini. Titus Mould, MD, [place] Pgr: [facility]

## 2015-04-20 DIAGNOSIS — F0390 Unspecified dementia without behavioral disturbance: Secondary | ICD-10-CM | POA: Diagnosis not present

## 2015-04-20 DIAGNOSIS — F89 Unspecified disorder of psychological development: Secondary | ICD-10-CM | POA: Diagnosis not present

## 2015-04-20 DIAGNOSIS — G40301 Generalized idiopathic epilepsy and epileptic syndromes, not intractable, with status epilepticus: Secondary | ICD-10-CM | POA: Diagnosis not present

## 2015-04-20 DIAGNOSIS — C3490 Malignant neoplasm of unspecified part of unspecified bronchus or lung: Secondary | ICD-10-CM

## 2015-04-20 DIAGNOSIS — I10 Essential (primary) hypertension: Secondary | ICD-10-CM

## 2015-04-20 DIAGNOSIS — C3492 Malignant neoplasm of unspecified part of left bronchus or lung: Secondary | ICD-10-CM | POA: Diagnosis present

## 2015-04-20 DIAGNOSIS — G40909 Epilepsy, unspecified, not intractable, without status epilepticus: Secondary | ICD-10-CM | POA: Diagnosis not present

## 2015-04-20 DIAGNOSIS — G40901 Epilepsy, unspecified, not intractable, with status epilepticus: Secondary | ICD-10-CM | POA: Diagnosis not present

## 2015-04-20 DIAGNOSIS — E876 Hypokalemia: Secondary | ICD-10-CM

## 2015-04-20 LAB — GLUCOSE, CAPILLARY
GLUCOSE-CAPILLARY: 104 mg/dL — AB (ref 65–99)
GLUCOSE-CAPILLARY: 110 mg/dL — AB (ref 65–99)
GLUCOSE-CAPILLARY: 117 mg/dL — AB (ref 65–99)
Glucose-Capillary: 100 mg/dL — ABNORMAL HIGH (ref 65–99)
Glucose-Capillary: 110 mg/dL — ABNORMAL HIGH (ref 65–99)

## 2015-04-20 LAB — CULTURE, BLOOD (ROUTINE X 2)

## 2015-04-20 LAB — BASIC METABOLIC PANEL
ANION GAP: 8 (ref 5–15)
BUN: <5 mg/dL — ABNORMAL LOW (ref 6–20)
CHLORIDE: 104 mmol/L (ref 101–111)
CO2: 26 mmol/L (ref 22–32)
Calcium: 8.2 mg/dL — ABNORMAL LOW (ref 8.9–10.3)
Creatinine, Ser: 0.88 mg/dL (ref 0.61–1.24)
GFR calc Af Amer: >60 mL/min (ref >60)
Glucose, Bld: 107 mg/dL — ABNORMAL HIGH (ref 65–99)
POTASSIUM: 3.3 mmol/L — AB (ref 3.5–5.1)
SODIUM: 138 mmol/L (ref 135–145)

## 2015-04-20 LAB — PHOSPHORUS: PHOSPHORUS: 2.8 mg/dL (ref 2.5–4.6)

## 2015-04-20 LAB — MAGNESIUM: Magnesium: 2 mg/dL (ref 1.7–2.4)

## 2015-04-20 MED ORDER — POTASSIUM CHLORIDE CRYS ER 20 MEQ PO TBCR
40.0000 meq | EXTENDED_RELEASE_TABLET | Freq: Once | ORAL | Status: AC
  Administered 2015-04-20: 40 meq via ORAL
  Filled 2015-04-20: qty 2

## 2015-04-20 NOTE — Progress Notes (Signed)
[facility] Progress Note Patient Name: Marco [name] DOB: Jul 24, 1951 MRN: [id number]   Date of Service  04/20/2015  HPI/Events of Note  Hypokalemia  eICU Interventions  Potassium replaced     Intervention Category Minor Interventions: Electrolytes abnormality - evaluation and management  Merrily Tegeler 04/20/2015, 5:13 AM

## 2015-04-20 NOTE — Progress Notes (Signed)
Physical Therapy Treatment Patient Details Name: Marco [name] MRN: [id number] DOB: 1950/11/21 Today's Date: 04/20/2015    History of Present Illness ILLNESS: Patient is a 64 year old African-American male with a past medical history significant for developmental disability, seizure disorder, dementia, hypertension, and Recurrent non-small cell lung cancer initially diagnosed as Stage IIA (T2a., N1, M0) non-small cell lung cancer who was witnessed having a seizure by someone at his group home.  Developed respiratory failure requiring intubation, extubated 04/18/15.    PT Comments    Pt progressing towards physical therapy goals. Was able to perform transfers and ambulation with +2 assist mainly for safety. Pt walked ~200' with HHA and occasional physical assist for unsteadiness. Had difficulty following commands during therapeutic exercise, however was able to complete with tactile cueing from therapist. Will continue to follow.   Follow Up Recommendations  Home health PT;Supervision/Assistance - 24 hour     Equipment Recommendations  Other (comment) (TBD )    Recommendations for Other Services       Precautions / Restrictions Precautions Precautions: Fall Restrictions Weight Bearing Restrictions: No    Mobility  Bed Mobility Overal bed mobility: Needs Assistance Bed Mobility: Supine to Sit     Supine to sit: Min assist     General bed mobility comments: VC's for sequencing and hand placement during transition to EOB. Min assist provided for trunk stability during elevation.   Transfers Overall transfer level: Needs assistance Equipment used: 2 person hand held assist Transfers: Sit to/from Stand Sit to Stand: Min assist;+2 safety/equipment         General transfer comment: Assist for balance as pt powered-up to full standing position. Increased time required for pt to gain/maintain standing balance.   Ambulation/Gait Ambulation/Gait assistance: Min assist;+2  safety/equipment Ambulation Distance (Feet): 200 Feet Assistive device: 2 person hand held assist Gait Pattern/deviations: Step-through pattern;Decreased stride length;Trunk flexed Gait velocity: Decreased Gait velocity interpretation: Below normal speed for age/gender General Gait Details: Pt was able to ambulate ~200' with +2 assist mainly for safety. Occasional min assist required for balance. Pt took directional cueing well.    Stairs            Wheelchair Mobility    Modified Rankin (Stroke Patients Only)       Balance Overall balance assessment: Needs assistance Sitting-balance support: Feet supported;No upper extremity supported Sitting balance-Leahy Scale: Fair   Postural control: Posterior lean   Standing balance-Leahy Scale: Poor                      Cognition Arousal/Alertness: Awake/alert Behavior During Therapy: Flat affect Overall Cognitive Status: No family/caregiver present to determine baseline cognitive functioning                      Exercises General Exercises - Lower Extremity Straight Leg Raises: 10 reps;AAROM    General Comments        Pertinent Vitals/Pain Pain Assessment: No/denies pain    Home Living                      Prior Function            PT Goals (current goals can now be found in the care plan section) Acute Rehab PT Goals Patient Stated Goal: Unable to state PT Goal Formulation: Patient unable to participate in goal setting Time For Goal Achievement: 05/03/15 Potential to Achieve Goals: Good Progress towards PT goals: Progressing toward goals  Frequency  Min 3X/week    PT Plan Current plan remains appropriate    Co-evaluation             End of Session Equipment Utilized During Treatment: Gait belt Activity Tolerance: Patient tolerated treatment well Patient left: in chair;with nursing/sitter in room     Time: 0810-0841 PT Time Calculation (min) (ACUTE ONLY): 31  min  Charges:  $Gait Training: 8-22 mins $Therapeutic Activity: 8-22 mins                    G Codes:      Rolinda Roan 2015-05-08, 1:59 PM   Rolinda Roan, PT, DPT Acute Rehabilitation Services Pager: [phone]

## 2015-04-20 NOTE — Progress Notes (Signed)
[place] TEAM 1 - Stepdown/ICU TEAM Progress Note  RUSTY VILLELLA WTU:[id number] DOB: 10/24/1950 DOA: 04/17/2015 PCP: Leamon Arnt, MD  Admit HPI / Brief Narrative: 64 year old BM PMHx depression, developmental disability, seizure disorder, dementia, hypertension, and Recurrent NSCLC squamous cell carcinoma presented with central left upper lobe mass as well as hilar lymphadenopathy diagnosed in January of 2015 initially diagnosed as Stage IIA (T2a., N1, M0) S/P XRT and immunotherapy. Currently being treated with immunotherapy in the form ofNivolumab 3 mg/kg given every 2 weeks, status post 4 cycles.  Witnessed having a seizure by someone at his group home. It is noted by EMS patient had 2 subsequent seizures around to the emergency department and was given 2.5 mg of diazepam intravenously. Per report he was unresponsive and needed to be bagged on arrival to the emergency department where he was subsequently intubated and placed on mechanical ventilation and started on a propofol drip. It is noted in his past medical history that he takes 1000 mg of Keppra twice a day. At this time I cannot obtain any further information regarding the patient as he is on a propofol drip and is intubated.  HPI/Subjective: 8/16 alert, could answer some questions however significant dysarthria. Given Mr. [name] [phone] states patient's cognition is improving but not at baseline. Mr. [name] states tonic-clonic seizures 4 witnessed 2 in the group home [facility], and 2 in route in the ambulance. Mr. [name] states that at baseline patient able to ambulate on his own, fix sandwiches and drinks, take care of on toiletries. States last seizure was in February and one prior to that in August with February being the worst. Mr. [name] states that patient is seen by Dr. Lorna Few (oncology) for his NSCLC. Patient was scheduled to have chest CT Thursday, and follow-up appointment with new neurologist on  25 September.   Assessment/Plan: Seizure disorder -Keppra 1000 mg BID - Developmental disability/Dementia -Aricept '10mg'$  QHS -Ativan 1 mg QHS -Risperidone 3 mg QHS -Trazodone 25 mg QHS  NSCLC squamous cell carcinoma  -MRI of the brain; negative for metastasis   Hypertension -BP controlled without medication monitor closely  Hypokalemia -K-Dur 40 mEq x1  Coag neg staph contamination -Most likely contamination hold all antibiotics monitor closely      Code Status: FULL Family Communication: Mr. [name] [phone] Care Giver present at time of exam Disposition Plan: Back to [facility], living facility    Consultants: Dr.Charles Nicole Kindred (neuro hospitalist)    Procedure/Significant Events: 8/13 CT of Head >> mild atrophic changes, no acute abnormality  8/13 MRI Brain >> no acute intracranial process, no evidence of intracranial metastasis, mild to mod generalized cerebral atrophy 8/13 Pt presented to ED with witnessed seizures. Intubated, admit to ICU     Culture 8/13 blood right/left hand positive 1/2 coag negative staph most likely contaminant 8/14 negative MRSA by PCR   Antibiotics:   DVT prophylaxis: Lovenox   Devices    LINES / TUBES:      Continuous Infusions:   Objective: VITAL SIGNS: Temp: 98.2 F (36.8 C) (08/16 1545) Temp Source: Oral (08/16 1545) BP: 101/64 mmHg (08/16 1900) Pulse Rate: 99 (08/16 1900) SPO2; FIO2:   Intake/Output Summary (Last 24 hours) at 04/20/15 1946 Last data filed at 04/20/15 1900  Gross per 24 hour  Intake    930 ml  Output   1265 ml  Net   -335 ml     Exam: General: alert, could answer some questions however significant dysarthria., NAD,  No acute respiratory distress Eyes: Negative headache, eye pain, double vision,negative scleral hemorrhage ENT: Very poor dentation, Negative Runny nose, negative ear pain, negative tinnitus, negative gingival bleeding, Neck:  Negative scars,  masses, torticollis, lymphadenopathy, JVD Lungs: Clear to auscultation bilaterally without wheezes or crackles Cardiovascular: Regular rate and rhythm without murmur gallop or rub normal S1 and S2 Abdomen:negative abdominal pain, negative dysphagia, nondistended, positive soft, bowel sounds, no rebound, no ascites, no appreciable mass Extremities: No significant cyanosis, clubbing, or edema bilateral lower extremities Psychiatric:  Obvious mental retardation however pleasant cooperative Neurologic:  Cranial nerves II through XII intact, tongue/uvula midline, all extremities muscle strength 5/5, sensation intact throughout, positive dysarthria, negative expressive aphasia, negative receptive aphasia.    Data Reviewed: Basic Metabolic Panel:  Recent Labs Lab 04/17/15 1829 04/17/15 2153 04/18/15 0559 04/19/15 0246 04/20/15 0404  NA 140  --  141 141 138  K 4.1  --  3.1* 3.3* 3.3*  CL 107  --  109 109 104  CO2 18*  --  21* 23 26  GLUCOSE 146*  --  134* 94 107*  BUN 12  --  11 5* <5*  CREATININE 1.27* 1.17 1.04 0.90 0.88  CALCIUM 8.6*  --  8.4* 8.3* 8.2*  MG  --   --  2.0 1.9 2.0  PHOS  --   --  2.2* 2.2* 2.8   Liver Function Tests:  Recent Labs Lab 04/17/15 1829  AST 30  ALT 9*  ALKPHOS 68  BILITOT 0.7  PROT 6.4*  ALBUMIN 3.5   No results for input(s): LIPASE, AMYLASE in the last 168 hours. No results for input(s): AMMONIA in the last 168 hours. CBC:  Recent Labs Lab 04/17/15 1829 04/18/15 0123 04/18/15 0559 04/19/15 0246  WBC 12.0* 11.2* 12.2* 10.3  NEUTROABS 11.2*  --   --   --   HGB 13.0 14.1 13.5 12.5*  HCT 40.5 44.7 40.5 38.2*  MCV 89.8 90.7 90.0 89.5  PLT 174 162 171 142*   Cardiac Enzymes: No results for input(s): CKTOTAL, CKMB, CKMBINDEX, TROPONINI in the last 168 hours. BNP (last 3 results) No results for input(s): BNP in the last 8760 hours.  ProBNP (last 3 results) No results for input(s): PROBNP in the last 8760 hours.  CBG:  Recent  Labs Lab 04/19/15 2331 04/20/15 0358 04/20/15 0810 04/20/15 1143 04/20/15 1545  GLUCAP 96 100* 104* 110* 110*    Recent Results (from the past 240 hour(s))  Culture, blood (routine x 2)     Status: None (Preliminary result)   Collection Time: 04/17/15  9:42 PM  Result Value Ref Range Status   Specimen Description BLOOD RIGHT HAND  Final   Special Requests BOTTLES DRAWN AEROBIC ONLY 2CC  Final   Culture NO GROWTH 3 DAYS  Final   Report Status PENDING  Incomplete  Culture, blood (routine x 2)     Status: None   Collection Time: 04/17/15  9:54 PM  Result Value Ref Range Status   Specimen Description BLOOD LEFT HAND  Final   Special Requests BOTTLES DRAWN AEROBIC ONLY 2CC  Final   Culture  Setup Time   Final    GRAM POSITIVE COCCI IN CLUSTERS AEROBIC BOTTLE ONLY CRITICAL RESULT CALLED TO, READ BACK BY AND VERIFIED WITH: CLAUDIO,R RN 04/18/15 1800 [place] CONFIRMED BY M CAMPBELL    Culture   Final    STAPHYLOCOCCUS SPECIES (COAGULASE NEGATIVE) THE SIGNIFICANCE OF ISOLATING THIS ORGANISM FROM A SINGLE SET OF BLOOD CULTURES WHEN MULTIPLE  SETS ARE DRAWN IS UNCERTAIN. PLEASE NOTIFY THE MICROBIOLOGY DEPARTMENT WITHIN ONE WEEK IF SPECIATION AND SENSITIVITIES ARE REQUIRED.    Report Status 04/20/2015 FINAL  Final  MRSA PCR Screening     Status: None   Collection Time: 04/18/15  1:25 AM  Result Value Ref Range Status   MRSA by PCR NEGATIVE NEGATIVE Final    Comment:        The GeneXpert MRSA Assay (FDA approved for NASAL specimens only), is one component of a comprehensive MRSA colonization surveillance program. It is not intended to diagnose MRSA infection nor to guide or monitor treatment for MRSA infections.      Studies:  Recent x-ray studies have been reviewed in detail by the Attending Physician  Scheduled Meds:  Scheduled Meds: . donepezil  10 mg Oral QHS  . enoxaparin (LOVENOX) injection  40 mg Subcutaneous Q24H  . levETIRAcetam  1,500 mg Oral BID  . LORazepam   1 mg Oral QHS  . pantoprazole  40 mg Oral Daily  . risperiDONE  3 mg Oral QHS  . traZODone  25 mg Oral QHS    Time spent on care of this patient: 40 mins   Faelynn Wynder, Geraldo Docker , MD  [facility] Office  [phone] Pager [phone]  On-Call/Text Page:      [email or web address]      password [email or web address]  If 7PM-7AM, please contact night-coverage [email or web address] Password [email or web address] 04/20/2015, 7:46 PM   LOS: 3 days   Care during the described time interval was provided by me .  I have reviewed this patient's available data, including medical history, events of note, physical examination, and all test results as part of my evaluation. I have personally reviewed and interpreted all radiology studies.   Dia Crawford, MD [phone] Pager

## 2015-04-20 NOTE — Care Management Important Message (Signed)
Important Message  Patient Details  Name: Marco [name] MRN: [id number] Date of Birth: 08-Aug-1951   Medicare Important Message Given:  [facility] notification given    Nathen May 04/20/2015, 10:23 AMImportant Message  Patient Details  Name: Marco [name] MRN: [id number] Date of Birth: [birth year]   Medicare Important Message Given:  Yes-second notification given    Nathen May 04/20/2015, 10:23 AM

## 2015-04-21 ENCOUNTER — Inpatient Hospital Stay (HOSPITAL_COMMUNITY): Payer: Medicare Other

## 2015-04-21 ENCOUNTER — Encounter (HOSPITAL_COMMUNITY): Payer: Self-pay

## 2015-04-21 DIAGNOSIS — J9601 Acute respiratory failure with hypoxia: Secondary | ICD-10-CM

## 2015-04-21 DIAGNOSIS — G40301 Generalized idiopathic epilepsy and epileptic syndromes, not intractable, with status epilepticus: Secondary | ICD-10-CM | POA: Diagnosis not present

## 2015-04-21 DIAGNOSIS — G40901 Epilepsy, unspecified, not intractable, with status epilepticus: Secondary | ICD-10-CM | POA: Diagnosis not present

## 2015-04-21 DIAGNOSIS — R569 Unspecified convulsions: Secondary | ICD-10-CM | POA: Diagnosis not present

## 2015-04-21 DIAGNOSIS — F0391 Unspecified dementia with behavioral disturbance: Secondary | ICD-10-CM

## 2015-04-21 DIAGNOSIS — C3490 Malignant neoplasm of unspecified part of unspecified bronchus or lung: Secondary | ICD-10-CM | POA: Diagnosis not present

## 2015-04-21 LAB — GLUCOSE, CAPILLARY
GLUCOSE-CAPILLARY: 111 mg/dL — AB (ref 65–99)
GLUCOSE-CAPILLARY: 102 mg/dL — AB (ref 65–99)
Glucose-Capillary: 110 mg/dL — ABNORMAL HIGH (ref 65–99)
Glucose-Capillary: 90 mg/dL (ref 65–99)
Glucose-Capillary: 125 mg/dL — ABNORMAL HIGH (ref 65–99)
Glucose-Capillary: 86 mg/dL (ref 65–99)

## 2015-04-21 LAB — CBC WITH DIFFERENTIAL/PLATELET
Basophils Absolute: 0 10*3/uL (ref 0.0–0.1)
Basophils Relative: 0 % (ref 0–1)
Eosinophils Absolute: 0 10*3/uL (ref 0.0–0.7)
Eosinophils Relative: 0 % (ref 0–5)
HEMATOCRIT: 39.5 % (ref 39.0–52.0)
HEMOGLOBIN: 13 g/dL (ref 13.0–17.0)
LYMPHS ABS: 0.6 10*3/uL — AB (ref 0.7–4.0)
LYMPHS PCT: 8 % — AB (ref 12–46)
MCH: 29.3 pg (ref 26.0–34.0)
MCHC: 32.9 g/dL (ref 30.0–36.0)
MCV: 89.2 fL (ref 78.0–100.0)
MONO ABS: 0.7 10*3/uL (ref 0.1–1.0)
MONOS PCT: 10 % (ref 3–12)
NEUTROS ABS: 6 10*3/uL (ref 1.7–7.7)
NEUTROS PCT: 82 % — AB (ref 43–77)
Platelets: 167 10*3/uL (ref 150–400)
RBC: 4.43 MIL/uL (ref 4.22–5.81)
RDW: 13.2 % (ref 11.5–15.5)
WBC: 7.4 10*3/uL (ref 4.0–10.5)

## 2015-04-21 LAB — COMPREHENSIVE METABOLIC PANEL
ALBUMIN: 2.8 g/dL — AB (ref 3.5–5.0)
ALT: 14 U/L — AB (ref 17–63)
ANION GAP: 8 (ref 5–15)
AST: 16 U/L (ref 15–41)
Alkaline Phosphatase: 62 U/L (ref 38–126)
BUN: 7 mg/dL (ref 6–20)
CHLORIDE: 107 mmol/L (ref 101–111)
CO2: 25 mmol/L (ref 22–32)
Calcium: 8.6 mg/dL — ABNORMAL LOW (ref 8.9–10.3)
Creatinine, Ser: 0.85 mg/dL (ref 0.61–1.24)
GFR calc non Af Amer: >60 mL/min (ref >60)
GLUCOSE: 127 mg/dL — AB (ref 65–99)
Potassium: 3.5 mmol/L (ref 3.5–5.1)
SODIUM: 140 mmol/L (ref 135–145)
Total Bilirubin: 0.6 mg/dL (ref 0.3–1.2)
Total Protein: 6.4 g/dL — ABNORMAL LOW (ref 6.5–8.1)

## 2015-04-21 LAB — MAGNESIUM: Magnesium: 2 mg/dL (ref 1.7–2.4)

## 2015-04-21 MED ORDER — LORAZEPAM 1 MG PO TABS
2.0000 mg | ORAL_TABLET | Freq: Every day | ORAL | Status: DC
  Administered 2015-04-21: 2 mg via ORAL
  Filled 2015-04-21: qty 2

## 2015-04-21 MED ORDER — IOHEXOL 300 MG/ML  SOLN
80.0000 mL | Freq: Once | INTRAMUSCULAR | Status: AC | PRN
  Administered 2015-04-21: 75 mL via INTRAVENOUS

## 2015-04-21 NOTE — Progress Notes (Signed)
[facility] TEAM 1 - Stepdown/ICU TEAM Progress Note  Marco [name] FKC:[id number] DOB: 04-09-1951 DOA: 04/17/2015 PCP: Leamon Arnt, MD  Admit HPI / Brief Narrative: 64 year old M Hx depression, developmental disability, seizure disorder, dementia, hypertension, and recurrent NSCLC (squamous cell carcinoma - central left upper lobe mass as well as hilar lymphadenopathy diagnosed in January of 2015 initially Stage IIA T2a., N1, M0) S/P XRT and immunotherapy currently on Nivolumab 3 mg/kg every 2 weeks, status post 4 cycles who was witnessed having a seizure at his group home. Per EMS patient had 2 subsequent seizures en route  to the ED, and was given 2.5 mg of diazepam intravenously.  He was unresponsive and was subsequently intubated and placed on mechanical ventilation and started on a propofol drip. It is noted in his past medical history that he takes 1000 mg of Keppra twice a day.   HPI/Subjective: The pt is alert and conversant.  He has no complaints, denying cp, sob, n/v, or abdom pain.  He has a good appetite.    Assessment/Plan:  Seizure disorder w/ active seizures  Keppra increased to 1000 mg BID - MRI of the brain negative for metastasis - if remains stable over night will likely be ready for d/c in AM  Acute hypoxic resp failure due to seizure Intubated for airway protection - no persisting acute respiratory issues   Developmental disability / Dementia Continue aricept, trazodone, risperdal as per home regimen   Unresectable NSCLC squamous cell carcinoma  MRI of the brain negative for metastasis - followed by Dr. Inda Merlin - plan is for repeat CT of chest for restaging of disease, w/ f/u at [facility] afterwards - will go ahead and request CT of chest w/ contrast while pt is in hospital to avoid his having to return for a CT shortly after d/c   Hypertension BP controlled without medication monitor closely - does not appear to have been on meds prior to admit    Hypokalemia Corrected w/ replacement  Hypophosphatemia Corrected w/ replacement  Coag neg staph in blood cx - 1 of 2 Most likely contamination - hold all antibiotics - no clinical signs/symptoms of infection    Code Status: FULL Family Communication: no family present at time of exam  Disposition Plan: Back to [facility], living facility - probable d/c 8/1/8 - CT chest w/ contrast today - follow for agitation/sz activity   Consultants: Dr.Charles Nicole [facility] (Neuro)  Procedures: 8/13 CT of Head >> mild atrophic changes, no acute abnormality  8/13 MRI Brain >> no acute intracranial process, no evidence of intracranial metastasis, mild to mod generalized cerebral atrophy  Antibiotics: none  DVT prophylaxis: Lovenox  Objective: Blood pressure 123/72, pulse 79, temperature 98.1 F (36.7 C), temperature source Oral, resp. rate 16, weight 67.8 kg (149 lb 7.6 oz), SpO2 98 %.  Intake/Output Summary (Last 24 hours) at 04/21/15 0916 Last data filed at 04/21/15 0800  Gross per 24 hour  Intake    740 ml  Output   1455 ml  Net   -715 ml   Exam: General: No acute respiratory distress Lungs: Clear to auscultation bilaterally without wheezes or crackles Cardiovascular: Regular rate and rhythm without murmur gallop or rub normal S1 and S2 Abdomen: Nontender, nondistended, soft, bowel sounds positive, no rebound, no ascites, no appreciable mass Extremities: No significant cyanosis, clubbing, or edema bilateral lower extremities  Data Reviewed: Basic Metabolic Panel:  Recent Labs Lab 04/17/15 1829 04/17/15 2153 04/18/15 0559 04/19/15 0246 04/20/15 0404 04/21/15 7494  NA 140  --  141 141 138 140  K 4.1  --  3.1* 3.3* 3.3* 3.5  CL 107  --  109 109 104 107  CO2 18*  --  21* '23 26 25  '$ GLUCOSE 146*  --  134* 94 107* 127*  BUN 12  --  11 5* <5* 7  CREATININE 1.27* 1.17 1.04 0.90 0.88 0.85  CALCIUM 8.6*  --  8.4* 8.3* 8.2* 8.6*  MG  --   --  2.0 1.9 2.0 2.0  PHOS  --    --  2.2* 2.2* 2.8  --    Liver Function Tests:  Recent Labs Lab 04/17/15 1829 04/21/15 0227  AST 30 16  ALT 9* 14*  ALKPHOS 68 62  BILITOT 0.7 0.6  PROT 6.4* 6.4*  ALBUMIN 3.5 2.8*   CBC:  Recent Labs Lab 04/17/15 1829 04/18/15 0123 04/18/15 0559 04/19/15 0246 04/21/15 0227  WBC 12.0* 11.2* 12.2* 10.3 7.4  NEUTROABS 11.2*  --   --   --  6.0  HGB 13.0 14.1 13.5 12.5* 13.0  HCT 40.5 44.7 40.5 38.2* 39.5  MCV 89.8 90.7 90.0 89.5 89.2  PLT 174 162 171 142* 167   CBG:  Recent Labs Lab 04/20/15 1545 04/20/15 1959 04/20/15 2346 04/21/15 0400 04/21/15 0748  GLUCAP 110* 117* 110* 111* 102*    Recent Results (from the past 240 hour(s))  Culture, blood (routine x 2)     Status: None (Preliminary result)   Collection Time: 04/17/15  9:42 PM  Result Value Ref Range Status   Specimen Description BLOOD RIGHT HAND  Final   Special Requests BOTTLES DRAWN AEROBIC ONLY 2CC  Final   Culture NO GROWTH 3 DAYS  Final   Report Status PENDING  Incomplete  Culture, blood (routine x 2)     Status: None   Collection Time: 04/17/15  9:54 PM  Result Value Ref Range Status   Specimen Description BLOOD LEFT HAND  Final   Special Requests BOTTLES DRAWN AEROBIC ONLY 2CC  Final   Culture  Setup Time   Final    GRAM POSITIVE COCCI IN CLUSTERS AEROBIC BOTTLE ONLY CRITICAL RESULT CALLED TO, READ BACK BY AND VERIFIED WITH: CLAUDIO,R RN 04/18/15 1800 [place] BY M CAMPBELL    Culture   Final    STAPHYLOCOCCUS SPECIES (COAGULASE NEGATIVE) THE SIGNIFICANCE OF ISOLATING THIS ORGANISM FROM A SINGLE SET OF BLOOD CULTURES WHEN MULTIPLE SETS ARE DRAWN IS UNCERTAIN. PLEASE NOTIFY THE MICROBIOLOGY DEPARTMENT WITHIN ONE WEEK IF SPECIATION AND SENSITIVITIES ARE REQUIRED.    Report Status 04/20/2015 FINAL  Final  MRSA PCR Screening     Status: None   Collection Time: 04/18/15  1:25 AM  Result Value Ref Range Status   MRSA by PCR NEGATIVE NEGATIVE Final    Comment:        The GeneXpert  MRSA Assay (FDA approved for NASAL specimens only), is one component of a comprehensive MRSA colonization surveillance program. It is not intended to diagnose MRSA infection nor to guide or monitor treatment for MRSA infections.      Studies:  Recent x-ray studies have been reviewed in detail by the Attending Physician  Scheduled Meds:  Scheduled Meds: . donepezil  10 mg Oral QHS  . enoxaparin (LOVENOX) injection  40 mg Subcutaneous Q24H  . levETIRAcetam  1,500 mg Oral BID  . LORazepam  1 mg Oral QHS  . pantoprazole  40 mg Oral Daily  . risperiDONE  3 mg Oral  QHS  . traZODone  25 mg Oral QHS    Time spent on care of this patient: 35 mins  Cherene Altes, MD [facility] For Consults/Admissions - Flow Manager - [phone] Office  [phone]  Contact MD directly via text page:      [email or web address]      password [facility]  04/21/2015, 9:16 AM   LOS: 4 days

## 2015-04-21 NOTE — Progress Notes (Signed)
Physical Therapy Treatment Patient Details Name: Marco [name] MRN: [id number] DOB: February 04, 1951 Today's Date: 04/21/2015    History of Present Illness ILLNESS: Patient is a 64 year old African-American male with a past medical history significant for developmental disability, seizure disorder, dementia, hypertension, and Recurrent non-small cell lung cancer initially diagnosed as Stage IIA (T2a., N1, M0) non-small cell lung cancer who was witnessed having a seizure by someone at his group home.  Developed respiratory failure requiring intubation, extubated 04/18/15.    PT Comments    Pt progressing towards physical therapy goals. Focus of session was to assess pt's ability to negotiate stairs for safe return to his group home. Pt was able to ascend and descend a total of 8 stairs with R hand rail and hands-on guarding for assist. Pt was cued to decrease speed for safety. Will continue to follow and progress as able per POC.   Follow Up Recommendations  Home health PT;Supervision/Assistance - 24 hour     Equipment Recommendations  None recommended by PT    Recommendations for Other Services       Precautions / Restrictions Precautions Precautions: Fall Restrictions Weight Bearing Restrictions: No    Mobility  Bed Mobility               General bed mobility comments: Pt was received sitting in recliner  Transfers Overall transfer level: Needs assistance Equipment used: 1 person hand held assist Transfers: Sit to/from Stand Sit to Stand: Min assist         General transfer comment: Minor unsteadiness noted. No real assistance required to recover.   Ambulation/Gait Ambulation/Gait assistance: Min assist Ambulation Distance (Feet): 250 Feet Assistive device: 2 person hand held assist Gait Pattern/deviations: Step-through pattern;Decreased stride length;Trunk flexed Gait velocity: Decreased Gait velocity interpretation: Below normal speed for age/gender General  Gait Details: Pt was able to ambulate with VC's for direction and occasional assist as pt drifting R and L in the hallway.    Stairs Stairs: Yes Stairs assistance: Min guard Stair Management: One rail Right Number of Stairs: 8 (4 steps x2)    Wheelchair Mobility    Modified Rankin (Stroke Patients Only)       Balance Overall balance assessment: Needs assistance Sitting-balance support: Feet supported;No upper extremity supported Sitting balance-Leahy Scale: Fair   Postural control: Posterior lean Standing balance support: No upper extremity supported Standing balance-Leahy Scale: Fair                      Cognition Arousal/Alertness: Awake/alert Behavior During Therapy: Flat affect Overall Cognitive Status: No family/caregiver present to determine baseline cognitive functioning (Likely baseline)                      Exercises      General Comments        Pertinent Vitals/Pain Pain Assessment: No/denies pain    Home Living                      Prior Function            PT Goals (current goals can now be found in the care plan section) Acute Rehab PT Goals Patient Stated Goal: Unable to state PT Goal Formulation: Patient unable to participate in goal setting Time For Goal Achievement: 05/03/15 Potential to Achieve Goals: Good Progress towards PT goals: Progressing toward goals    Frequency  Min 3X/week    PT Plan Current plan remains appropriate  Co-evaluation             End of Session Equipment Utilized During Treatment: Gait belt Activity Tolerance: Patient tolerated treatment well Patient left: in chair;with call bell/phone within reach;with nursing/sitter in room     Time: 3545-6256 PT Time Calculation (min) (ACUTE ONLY): 14 min  Charges:  $Gait Training: 8-22 mins                    G Codes:      Rolinda Roan 16-May-2015, 3:29 PM   Rolinda Roan, PT, DPT Acute Rehabilitation Services Pager:  [phone]

## 2015-04-21 NOTE — Care Management Note (Addendum)
Case Management Note  Patient Details  Name: Marco [name] MRN: [id number] Date of Birth: 1951-01-22  Subjective/Objective:       Patient lives in a group home, Per SW discovered patient lives at [facility] at [address] [place].  This is a branch of [facility]. Patient has 3 other brothers that live here also.  They do have stairs and will need to be sure he can manage them.  They do 24 hour assistance and do not have a specific agency for [facility].  Contact [name] at [phone].  He called back with supervisor contact Willaim Sheng who has specific questions about the stairs.  Will contact PT to make sure the work with him on stairs.   PT came back and worked with patient on stairs - thank you - states he did very well and she does not have a concern with him being able to go up or down steps.  [facility] and updated.   States they will pick up on discharge, just need to call [name] - number listed above.  Use [name] - will place referral for HHPT.  Will need order on discharge for PT and face to face.     Action/Plan:   Expected Discharge Date:                  Expected Discharge Plan:  Group Home  In-House Referral:  Clinical Social Work  Discharge planning Services  CM Consult  Post Acute Care Choice:    Choice offered to:     DME Arranged:    DME Agency:     HH Arranged:    HH Agency:     Status of Service:  In process, will continue to follow  Medicare Important Message Given:  Yes-second notification given Date Medicare IM Given:    Medicare IM give by:    Date Additional Medicare IM Given:    Additional Medicare Important Message give by:     If discussed at [place] of Stay Meetings, dates discussed:    Additional Comments:  Vergie Living, RN 04/21/2015, 12:04 PM

## 2015-04-22 DIAGNOSIS — R569 Unspecified convulsions: Secondary | ICD-10-CM

## 2015-04-22 DIAGNOSIS — C3490 Malignant neoplasm of unspecified part of unspecified bronchus or lung: Secondary | ICD-10-CM | POA: Diagnosis not present

## 2015-04-22 DIAGNOSIS — I10 Essential (primary) hypertension: Secondary | ICD-10-CM | POA: Diagnosis not present

## 2015-04-22 DIAGNOSIS — F89 Unspecified disorder of psychological development: Secondary | ICD-10-CM | POA: Diagnosis not present

## 2015-04-22 DIAGNOSIS — G40301 Generalized idiopathic epilepsy and epileptic syndromes, not intractable, with status epilepticus: Secondary | ICD-10-CM | POA: Diagnosis not present

## 2015-04-22 DIAGNOSIS — E876 Hypokalemia: Secondary | ICD-10-CM | POA: Diagnosis not present

## 2015-04-22 DIAGNOSIS — G40901 Epilepsy, unspecified, not intractable, with status epilepticus: Secondary | ICD-10-CM | POA: Diagnosis not present

## 2015-04-22 LAB — CULTURE, BLOOD (ROUTINE X 2): Culture: NO GROWTH

## 2015-04-22 LAB — GLUCOSE, CAPILLARY: GLUCOSE-CAPILLARY: 110 mg/dL — AB (ref 65–99)

## 2015-04-22 MED ORDER — LEVETIRACETAM 750 MG PO TABS: 1500.0000 mg | ORAL_TABLET | Freq: Two times a day (BID) | ORAL | Status: DC

## 2015-04-22 NOTE — Care Management Note (Signed)
Case Management Note  Patient Details  Name: Marco [name] MRN: [id number] Date of Birth: 06-Feb-1951  Subjective/Objective:          Discharging back to group home today.  PT ordered.  [facility] aware of discharge today and plan for HHPT.  [name] notified of discharge per voice mail and nurse updated to call him back when have a more definite time of discharge.           Action/Plan:   Expected Discharge Date:                  Expected Discharge Plan:  Group Home  In-House Referral:  Clinical Social Work  Discharge planning Services  CM Consult  Post Acute Care Choice:    Choice offered to:   (talked with supervisor of group home and what agency they use. )  DME Arranged:    DME Agency:     HH Arranged:  PT HH Agency:  [place]  Status of Service:  Completed, signed off  Medicare Important Message Given:  Yes-second notification given Date Medicare IM Given:    Medicare IM give by:    Date Additional Medicare IM Given:    Additional Medicare Important Message give by:     If discussed at [place] of Stay Meetings, dates discussed:    Additional Comments:  Vergie Living, RN 04/22/2015, 2:31 PM

## 2015-04-22 NOTE — Progress Notes (Signed)
Pt discharge instructions and handout given to guardian. Careplan and education completed.

## 2015-04-22 NOTE — Discharge Summary (Signed)
Physician Discharge Summary  Marco [name] JYN:[id number] DOB: July 17, 1951 DOA: 04/17/2015  PCP: Leamon Arnt, MD  Admit date: 04/17/2015 Discharge date: 04/22/2015  Time spent: 40 minutes  Recommendations for Outpatient Follow-up:  Seizure disorder -Keppra 1500 mg BID -Follow-up in 4 weeks with Dr. Jim Like (neurology)  Developmental disability/Dementia -Aricept '10mg'$  QHS -Ativan 2 mg QHS -Risperidone 3 mg QHS -Trazodone 25 mg QHS  NSCLC squamous cell carcinoma  -MRI of the brain; negative for metastasis  -Follow-up in 2 weeks with Dr. Curt Bears (oncology) NSCLC  Hypertension -BP controlled without medication monitor closely -Follow-up in 7 days with PCP  Hypokalemia -Resolved   Coag neg staph contamination -Most likely contamination hold all antibiotics monitor closely      Discharge Diagnoses:  Active Problems:   Seizure   Acute respiratory failure with hypoxia   Lung cancer   Developmental disability   Dementia   Non-small cell lung cancer   Essential hypertension   Hypokalemia   Discharge Condition: Stable  Diet recommendation: Regular  Filed Weights   04/20/15 0500 04/21/15 0500 04/22/15 0500  Weight: 67.9 kg (149 lb 11.1 oz) 67.8 kg (149 lb 7.6 oz) 68 kg (149 lb 14.6 oz)    History of present illness:  64 year old BM PMHx depression, developmental disability, seizure disorder, dementia, hypertension, and Recurrent NSCLC squamous cell carcinoma presented with central left upper lobe mass as well as hilar lymphadenopathy diagnosed in January of 2015 initially diagnosed as Stage IIA (T2a., N1, M0) S/P XRT and immunotherapy. Currently being treated with immunotherapy in the form ofNivolumab 3 mg/kg given every 2 weeks, status post 4 cycles.  Witnessed having a seizure by someone at his group home. It is noted by EMS patient had 2 subsequent seizures around to the emergency department and was given 2.5 mg of diazepam intravenously. Per report  he was unresponsive and needed to be bagged on arrival to the emergency department where he was subsequently intubated and placed on mechanical ventilation and started on a propofol drip. It is noted in his past medical history that he takes 1000 mg of Keppra twice a day. At this time I cannot obtain any further information regarding the patient as he is on a propofol drip and is intubated. During his hospitalization patient was treated for acute on chronic seizure disorder. Patient's seizure medication was adjusted and no further seizures witnessed. Patient's mental cognition back to baseline   Consultants: Dr.Charles Nicole Kindred (neuro hospitalist)    Procedure/Significant Events: 8/13 CT of Head >> mild atrophic changes, no acute abnormality  8/13 MRI Brain >> no acute intracranial process, no evidence of intracranial metastasis, mild to mod generalized cerebral atrophy 8/13 Pt presented to ED with witnessed seizures. Intubated, admit to ICU     Culture 8/13 blood right/left hand positive 1/2 coag negative staph most likely contaminant 8/14 negative MRSA by PCR    Discharge Exam: Filed Vitals:   04/22/15 0500 04/22/15 0749 04/22/15 0754 04/22/15 1121  BP:  113/84    Pulse:   94   Temp:  97.5 F (36.4 C)  97.6 F (36.4 C)  TempSrc:  Oral  Oral  Resp:   14   Weight: 68 kg (149 lb 14.6 oz)     SpO2:   100%     General: alert, could answer some questions significant dysarthria (baseline) ., NAD, No acute respiratory distress Eyes: Negative headache, eye pain, double vision,negative scleral hemorrhage ENT: Very poor dentation, Negative Runny nose, negative ear pain, negative tinnitus,  negative gingival bleeding, Neck: Negative scars, masses, torticollis, lymphadenopathy, JVD Lungs: Clear to auscultation bilaterally without wheezes or crackles Cardiovascular: Regular rate and rhythm without murmur gallop or rub normal S1 and S2  Discharge Instructions     Medication List     ASK your doctor about these medications        bisacodyl 5 MG EC tablet  Commonly known as:  DULCOLAX  Take 5 mg by mouth daily as needed for moderate constipation (constipation).     donepezil 10 MG tablet  Commonly known as:  ARICEPT  Take 10 mg by mouth at bedtime.     fluticasone 50 MCG/ACT nasal spray  Commonly known as:  FLONASE  Place 2 sprays into both nostrils daily.     levETIRAcetam 1000 MG tablet  Commonly known as:  KEPPRA  Take 1 tablet (1,000 mg total) by mouth 2 (two) times daily.     LORazepam 1 MG tablet  Commonly known as:  ATIVAN  Take 2 mg by mouth at bedtime.     omeprazole 20 MG capsule  Commonly known as:  PRILOSEC  Take 20 mg by mouth 2 (two) times daily.     prochlorperazine 10 MG tablet  Commonly known as:  COMPAZINE  Take 1 tablet (10 mg total) by mouth every 6 (six) hours as needed for nausea or vomiting.     risperiDONE 3 MG tablet  Commonly known as:  RISPERDAL  Take 3 mg by mouth at bedtime.     traZODone 50 MG tablet  Commonly known as:  DESYREL  Take 25 mg by mouth at bedtime.       No Known Allergies    The results of significant diagnostics from this hospitalization (including imaging, microbiology, ancillary and laboratory) are listed below for reference.    Significant Diagnostic Studies: Ct Head Wo Contrast  04/17/2015   CLINICAL DATA:  Seizure activity and unresponsiveness  EXAM: CT HEAD WITHOUT CONTRAST  TECHNIQUE: Contiguous axial images were obtained from the base of the skull through the vertex without intravenous contrast.  COMPARISON:  09/07/2013  FINDINGS: Bony calvarium is intact. Considerable cerumen is noted within the external auditory canals bilaterally. Mild atrophy is noted. No findings to suggest acute hemorrhage, acute infarction or space-occupying mass lesion are noted.  IMPRESSION: Mild atrophic changes are noted.  No acute abnormality seen.   Electronically Signed   By: Inez Catalina M.D.   On: 04/17/2015  21:14   Ct Chest W Contrast  04/21/2015   CLINICAL DATA:  Lung cancer, status post chemotherapy/immunotherapy  EXAM: CT CHEST WITH CONTRAST  TECHNIQUE: Multidetector CT imaging of the chest was performed during intravenous contrast administration.  CONTRAST:  20m OMNIPAQUE IOHEXOL 300 MG/ML  SOLN  COMPARISON:  02/19/2015  FINDINGS: Mediastinum/Nodes: Heart is normal in size. Trace anterior pericardial effusion.  Coronary atherosclerosis.  Mild atherosclerotic calcifications of the aortic arch.  No suspicious mediastinal or axillary lymphadenopathy.  Visualized thyroid is unremarkable.  Lungs/Pleura: 5.8 x 3.7 cm medial left upper lobe mass (series 2/image 19), corresponding to known primary bronchogenic neoplasm, grossly unchanged.  Surrounding radiation changes in the left perihilar region (series 3/ image 22). Small left pleural effusion.  Patchy/ground-glass opacity in the posterior right lower lobe (series 3/ image 34), new, suspicious for pneumonia.  Additional 4 mm right lower lobe nodule (series 3/ image 31), unchanged.  No pleural effusion or pneumothorax.  Upper abdomen: Visualized upper abdomen is unremarkable.  Musculoskeletal: Visualized osseous structures are within normal  limits.  IMPRESSION: Stable 5.8 x 3.7 cm left upper lobe mass, corresponding to known primary bronchogenic neoplasm. Surrounding radiation changes.  Patchy posterior right lower lobe opacity, suspicious for pneumonia.  Small left pleural effusion.   Electronically Signed   By: Julian Hy M.D.   On: 04/21/2015 18:58   Mr Jeri Cos WJ Contrast  04/18/2015   CLINICAL DATA:  Initial evaluation for acute seizure. History of recurrent non-small cell lung cancer. History of seizures.  EXAM: MRI HEAD WITHOUT AND WITH CONTRAST  TECHNIQUE: Multiplanar, multiecho pulse sequences of the brain and surrounding structures were obtained without and with intravenous contrast.  CONTRAST:  62m MULTIHANCE GADOBENATE DIMEGLUMINE 529 MG/ML IV  SOLN  COMPARISON:  Prior CT from 04/17/2015 as well as MRI from 09/09/2013.  FINDINGS: Moderate cerebral volume loss and atrophy present. Minimal T2/FLAIR hyperintensity within the pons like related to chronic small vessel ischemic disease.  No abnormal foci of restricted diffusion to suggest ischemia or changes related to seizure. Gray-white matter differentiation maintained. Normal intravascular flow voids maintained. No acute intracranial hemorrhage.  No mass lesion, mass effect, or midline shift. No abnormal enhancement on post-contrast sequences. No hydrocephalus. No extra-axial fluid collection. Hippocampi are symmetric bilaterally with normal morphology and signal intensity.  Craniocervical junction within normal limits. Pituitary gland normal.  No acute abnormality about the orbits. Sequelae of prior lens extraction noted on the left.  Scattered mucosal thickening present within the paranasal sinuses. Fluid within the posterior nasopharynx. Patient is intubated. Scattered opacity present within the mastoid air cells bilaterally. Inner ear structures within normal limits.  Bone marrow signal intensity within normal limits. No scalp soft tissue abnormality.  IMPRESSION: 1. No acute intracranial process identified. No evidence for intracranial metastasis. 2. Mild to moderate generalized cerebral atrophy.   Electronically Signed   By: BJeannine BogaM.D.   On: 04/18/2015 00:00   Dg Chest Port 1 View  04/17/2015   CLINICAL DATA:  Check endotracheal tube and nasogastric catheter placement  EXAM: PORTABLE CHEST - 1 VIEW  COMPARISON:  02/19/2015  FINDINGS: Cardiac shadow is within normal limits. Stable left upper lobe mass lesion is noted. An endotracheal tube is noted in satisfactory position 4.7 cm above the carina. A nasogastric catheter extends into the stomach. No other focal abnormality is seen.  IMPRESSION: Stable left upper lobe mass.  Tubes and lines as described.   Electronically Signed   By: MInez CatalinaM.D.   On: 04/17/2015 18:28    Microbiology: Recent Results (from the past 240 hour(s))  Culture, blood (routine x 2)     Status: None (Preliminary result)   Collection Time: 04/17/15  9:42 PM  Result Value Ref Range Status   Specimen Description BLOOD RIGHT HAND  Final   Special Requests BOTTLES DRAWN AEROBIC ONLY 2CC  Final   Culture NO GROWTH 4 DAYS  Final   Report Status PENDING  Incomplete  Culture, blood (routine x 2)     Status: None   Collection Time: 04/17/15  9:54 PM  Result Value Ref Range Status   Specimen Description BLOOD LEFT HAND  Final   Special Requests BOTTLES DRAWN AEROBIC ONLY 2CC  Final   Culture  Setup Time   Final    GRAM POSITIVE COCCI IN CLUSTERS AEROBIC BOTTLE ONLY CRITICAL RESULT CALLED TO, READ BACK BY AND VERIFIED WITH: CLAUDIO,R RN 04/18/15 1800 [place]   Culture   Final    STAPHYLOCOCCUS SPECIES (COAGULASE NEGATIVE) THE  SIGNIFICANCE OF ISOLATING THIS ORGANISM FROM A SINGLE SET OF BLOOD CULTURES WHEN MULTIPLE SETS ARE DRAWN IS UNCERTAIN. PLEASE NOTIFY THE MICROBIOLOGY DEPARTMENT WITHIN ONE WEEK IF SPECIATION AND SENSITIVITIES ARE REQUIRED.    Report Status 04/20/2015 FINAL  Final  MRSA PCR Screening     Status: None   Collection Time: 04/18/15  1:25 AM  Result Value Ref Range Status   MRSA by PCR NEGATIVE NEGATIVE Final    Comment:        The GeneXpert MRSA Assay (FDA approved for NASAL specimens only), is one component of a comprehensive MRSA colonization surveillance program. It is not intended to diagnose MRSA infection nor to guide or monitor treatment for MRSA infections.      Labs: Basic Metabolic Panel:  Recent Labs Lab 04/17/15 1829 04/17/15 2153 04/18/15 0559 04/19/15 0246 04/20/15 0404 04/21/15 0227  NA 140  --  141 141 138 140  K 4.1  --  3.1* 3.3* 3.3* 3.5  CL 107  --  109 109 104 107  CO2 18*  --  21* '23 26 25  '$ GLUCOSE 146*  --  134* 94 107* 127*  BUN 12  --  11 5* <5* 7   CREATININE 1.27* 1.17 1.04 0.90 0.88 0.85  CALCIUM 8.6*  --  8.4* 8.3* 8.2* 8.6*  MG  --   --  2.0 1.9 2.0 2.0  PHOS  --   --  2.2* 2.2* 2.8  --    Liver Function Tests:  Recent Labs Lab 04/17/15 1829 04/21/15 0227  AST 30 16  ALT 9* 14*  ALKPHOS 68 62  BILITOT 0.7 0.6  PROT 6.4* 6.4*  ALBUMIN 3.5 2.8*   No results for input(s): LIPASE, AMYLASE in the last 168 hours. No results for input(s): AMMONIA in the last 168 hours. CBC:  Recent Labs Lab 04/17/15 1829 04/18/15 0123 04/18/15 0559 04/19/15 0246 04/21/15 0227  WBC 12.0* 11.2* 12.2* 10.3 7.4  NEUTROABS 11.2*  --   --   --  6.0  HGB 13.0 14.1 13.5 12.5* 13.0  HCT 40.5 44.7 40.5 38.2* 39.5  MCV 89.8 90.7 90.0 89.5 89.2  PLT 174 162 171 142* 167   Cardiac Enzymes: No results for input(s): CKTOTAL, CKMB, CKMBINDEX, TROPONINI in the last 168 hours. BNP: BNP (last 3 results) No results for input(s): BNP in the last 8760 hours.  ProBNP (last 3 results) No results for input(s): PROBNP in the last 8760 hours.  CBG:  Recent Labs Lab 04/21/15 0748 04/21/15 1147 04/21/15 1542 04/21/15 1948 04/22/15 0010  GLUCAP 102* 90 125* 86 110*       Signed:  Dia Crawford, MD [facility] [phone] pager

## 2015-04-23 ENCOUNTER — Ambulatory Visit (HOSPITAL_COMMUNITY): Payer: Medicare Other

## 2015-04-27 ENCOUNTER — Telehealth: Payer: Self-pay | Admitting: Physician Assistant

## 2015-04-27 ENCOUNTER — Ambulatory Visit: Payer: Medicare Other

## 2015-04-27 ENCOUNTER — Ambulatory Visit (HOSPITAL_BASED_OUTPATIENT_CLINIC_OR_DEPARTMENT_OTHER): Payer: Medicare Other | Admitting: Physician Assistant

## 2015-04-27 ENCOUNTER — Encounter: Payer: Self-pay | Admitting: Physician Assistant

## 2015-04-27 ENCOUNTER — Other Ambulatory Visit (HOSPITAL_BASED_OUTPATIENT_CLINIC_OR_DEPARTMENT_OTHER): Payer: Medicare Other

## 2015-04-27 VITALS — BP 113/69 | HR 107 | Temp 97.5°F | Resp 18 | Ht 70.0 in | Wt 142.6 lb

## 2015-04-27 DIAGNOSIS — Z79899 Other long term (current) drug therapy: Secondary | ICD-10-CM | POA: Diagnosis not present

## 2015-04-27 DIAGNOSIS — R5382 Chronic fatigue, unspecified: Secondary | ICD-10-CM

## 2015-04-27 DIAGNOSIS — C3492 Malignant neoplasm of unspecified part of left bronchus or lung: Secondary | ICD-10-CM

## 2015-04-27 DIAGNOSIS — Z5112 Encounter for antineoplastic immunotherapy: Secondary | ICD-10-CM

## 2015-04-27 DIAGNOSIS — C3412 Malignant neoplasm of upper lobe, left bronchus or lung: Secondary | ICD-10-CM

## 2015-04-27 LAB — CBC WITH DIFFERENTIAL/PLATELET
BASO%: 1 % (ref 0.0–2.0)
BASOS ABS: 0.1 10*3/uL (ref 0.0–0.1)
EOS%: 1 % (ref 0.0–7.0)
Eosinophils Absolute: 0.1 10*3/uL (ref 0.0–0.5)
HCT: 42.8 % (ref 38.4–49.9)
HGB: 14 g/dL (ref 13.0–17.1)
LYMPH%: 22.2 % (ref 14.0–49.0)
MCH: 29 pg (ref 27.2–33.4)
MCHC: 32.7 g/dL (ref 32.0–36.0)
MCV: 88.8 fL (ref 79.3–98.0)
MONO#: 0.4 10*3/uL (ref 0.1–0.9)
MONO%: 6.9 % (ref 0.0–14.0)
NEUT#: 3.6 10*3/uL (ref 1.5–6.5)
NEUT%: 68.9 % (ref 39.0–75.0)
NRBC: 0 % (ref 0–0)
Platelets: 280 10*3/uL (ref 140–400)
RBC: 4.82 10*6/uL (ref 4.20–5.82)
RDW: 13.2 % (ref 11.0–14.6)
WBC: 5.2 10*3/uL (ref 4.0–10.3)
lymph#: 1.2 10*3/uL (ref 0.9–3.3)

## 2015-04-27 LAB — COMPREHENSIVE METABOLIC PANEL (CC13)
ALT: 16 U/L (ref 0–55)
AST: 11 U/L (ref 5–34)
Albumin: 3.6 g/dL (ref 3.5–5.0)
Alkaline Phosphatase: 72 U/L (ref 40–150)
Anion Gap: 12 mEq/L — ABNORMAL HIGH (ref 3–11)
BUN: 12.2 mg/dL (ref 7.0–26.0)
CO2: 25 meq/L (ref 22–29)
Calcium: 9.8 mg/dL (ref 8.4–10.4)
Chloride: 111 mEq/L — ABNORMAL HIGH (ref 98–109)
Creatinine: 1 mg/dL (ref 0.7–1.3)
GLUCOSE: 90 mg/dL (ref 70–140)
POTASSIUM: 3.5 meq/L (ref 3.5–5.1)
SODIUM: 149 meq/L — AB (ref 136–145)
Total Bilirubin: 0.25 mg/dL (ref 0.20–1.20)
Total Protein: 7.4 g/dL (ref 6.4–8.3)

## 2015-04-27 LAB — TSH CHCC: TSH: 0.746 m(IU)/L (ref 0.320–4.118)

## 2015-04-27 NOTE — Telephone Encounter (Signed)
Call made to Marco [name] in reference to Marco [name] missing his Infusion today. She stated Marco [name] left because of Behavioral issues. Asked if he could return tomorrow for infusion at 2:30 pm. She stated that she would have to get back to me in reference to this matter. Awaiting call back.

## 2015-04-27 NOTE — Telephone Encounter (Signed)
Called to confirm appointment for tomorrow. Appointment confirmed by Willaim Sheng

## 2015-04-27 NOTE — Telephone Encounter (Signed)
per pof sch appt-gave pt copy of avs-sent MW email to see if trmt could be moved-will contact pt once reply

## 2015-04-27 NOTE — Telephone Encounter (Signed)
per pof to sch pt appt-gave pt copy of avs °

## 2015-04-27 NOTE — Progress Notes (Addendum)
[facility] [phone]   [phone]  OFFICE PROGRESS NOTE  Marco,[name], MD [address] [place] [address]  DIAGNOSIS: Stage IIA (T2a., N1, M0) non-small cell lung cancer, squamous cell carcinoma presented with central left upper lobe mass as well as hilar lymphadenopathy diagnosed in January of 2015.  PRIOR THERAPY:  1. Concurrent chemoradiation with weekly carboplatin for AUC of 2 and paclitaxel 45 mg/M2, status post 7 cycles. Last cycle was given 12/22/2013  2. Systemic chemotherapy with carboplatin for AUC of 5 and paclitaxel 175 MG/M2 with Neulasta support every 3 weeks. Status post 3 cycles  CURRENT THERAPY: Immunotherapy with Nivolumab 3 mg/kg given every 2 weeks. Status post 4 cycles.  INTERVAL HISTORY: Marco [name] 64 y.o. male returns to the clinic today for followup visit accompanied by his caregiver. He is currently receiving treatment with immunotherapy with nivolumab, status post 4 cycles. He was in the hospital at [facility] for a seizure recently. His caregiver states that [name] has a long-standing history of seizure disorder. His Keppra dose was recently increased to 1500 mg by mouth twice daily. Per the patient's sister, he tends to have a seizure about once every 6 months. He is tolerating his treatment with immunotherapy without difficulty. He denied having any nausea or vomiting, no fever or chills. He denied having any chest pain, shortness of breath, or hemoptysis. He denied having any significant weight loss or night sweats. He denied any diarrhea, skin rashes or change in his baseline breathing. He is here today to start cycle #5. He recently had a restaging CT scan of the chest and presents to discuss the results.  MEDICAL HISTORY: Past Medical History  Diagnosis Date  . Mental disorder     sczizophrenia;moderate retardation  . Depression   . DEMENTIA   . Hypertension     no medications, no  documented history per caregiver at preadmission  . GERD (gastroesophageal reflux disease)   . Seizures   . Developmental disability     developmentaly delayed  . Hx of radiation therapy 11/07/13-12/24/13    lung,60Gy/6f  . Mental retardation   . Seizure disorder   . Lung cancer   . Encounter for antineoplastic immunotherapy 04/05/2015    ALLERGIES:  has No Known Allergies.  MEDICATIONS:  Current Outpatient Prescriptions  Medication Sig Dispense Refill  . bisacodyl (DULCOLAX) 5 MG EC tablet Take 5 mg by mouth daily as needed for moderate constipation (constipation).     .Marland Kitchendonepezil (ARICEPT) 10 MG tablet Take 10 mg by mouth at bedtime.     . fluticasone (FLONASE) 50 MCG/ACT nasal spray Place 2 sprays into both nostrils daily.    .Marland KitchenlevETIRAcetam (KEPPRA) 750 MG tablet Take 2 tablets (1,500 mg total) by mouth 2 (two) times daily. 60 tablet 0  . LORazepam (ATIVAN) 1 MG tablet Take 2 mg by mouth at bedtime.    .Marland Kitchenomeprazole (PRILOSEC) 20 MG capsule Take 20 mg by mouth 2 (two) times daily.    . risperiDONE (RISPERDAL) 3 MG tablet Take 3 mg by mouth at bedtime.    . traZODone (DESYREL) 50 MG tablet Take 25 mg by mouth at bedtime.      No current facility-administered medications for this visit.    SURGICAL HISTORY:  Past Surgical History  Procedure Laterality Date  . Cataract extraction w/phaco  07/12/2011    Procedure: CATARACT EXTRACTION PHACO AND INTRAOCULAR LENS PLACEMENT (IOC);  Surgeon: GLavella Hammock  Anderson Malta;  Location: [facility];  Service: Ophthalmology;  Laterality: Left;  Marland Kitchen Eye surgery      [name] eye  . Video bronchoscopy Bilateral 10/01/2013    Procedure: VIDEO BRONCHOSCOPY WITHOUT FLUORO;  Surgeon: Collene Gobble, MD;  Location: [facility];  Service: Cardiopulmonary;  Laterality: Bilateral;    REVIEW OF SYSTEMS:  A comprehensive review of systems was negative except for: Genitourinary: positive for urinary incontinence   PHYSICAL EXAMINATION: General appearance: alert, cooperative and no  distress Head: Normocephalic, without obvious abnormality, atraumatic Neck: no adenopathy, no JVD, supple, symmetrical, trachea midline and thyroid not enlarged, symmetric, no tenderness/mass/nodules Lymph nodes: Cervical, supraclavicular, and axillary nodes normal. Resp: clear to auscultation bilaterally Back: symmetric, no curvature. ROM normal. No CVA tenderness. Cardio: regular rate and rhythm, S1, S2 normal, no murmur, click, rub or gallop GI: soft, non-tender; bowel sounds normal; no masses,  no organomegaly Extremities: extremities normal, atraumatic, no cyanosis or edema Neurologic: Alert and oriented X 3, normal strength and tone. Normal symmetric reflexes. Normal coordination and gait  ECOG PERFORMANCE STATUS: 1 - Symptomatic but completely ambulatory  Blood pressure 113/69, pulse 107, temperature 97.5 F (36.4 C), temperature source Oral, resp. rate 18, height '5\' 10"'$  (1.778 m), weight 142 lb 9.6 oz (64.683 kg), SpO2 100 %.  LABORATORY DATA: Lab Results  Component Value Date   WBC 5.2 04/27/2015   HGB 14.0 04/27/2015   HCT 42.8 04/27/2015   MCV 88.8 04/27/2015   PLT 280 04/27/2015      Chemistry      Component Value Date/Time   NA 149* 04/27/2015 0952   NA 140 04/21/2015 0227   K 3.5 04/27/2015 0952   K 3.5 04/21/2015 0227   CL 107 04/21/2015 0227   CO2 25 04/27/2015 0952   CO2 25 04/21/2015 0227   BUN 12.2 04/27/2015 0952   BUN 7 04/21/2015 0227   CREATININE 1.0 04/27/2015 0952   CREATININE 0.85 04/21/2015 0227      Component Value Date/Time   CALCIUM 9.8 04/27/2015 0952   CALCIUM 8.6* 04/21/2015 0227   ALKPHOS 72 04/27/2015 0952   ALKPHOS 62 04/21/2015 0227   AST 11 04/27/2015 0952   AST 16 04/21/2015 0227   ALT 16 04/27/2015 0952   ALT 14* 04/21/2015 0227   BILITOT 0.25 04/27/2015 0952   BILITOT 0.6 04/21/2015 0227       RADIOGRAPHIC STUDIES:  ASSESSMENT AND PLAN: This is a very pleasant 64 years old [place] American male with unresectable a  stage IIA non-small cell lung cancer. He completed a course of concurrent chemoradiation with weekly carboplatin and paclitaxel is status post 7 cycles. Has been observation since his treatment in April 2015.  His restaging CT scan of the chest showed further enlargement of the left upper lobe suprahilar mass concerning for disease recurrence. The patient was treated with 3 cycles of systemic chemotherapy was carboplatin and paclitaxel but unfortunately has evidence for disease progression. He was started on treatment with immunotherapy with Nivolumab status post 4 cycles and tolerating his treatment fairly well. His recent restaging CT scan revealed stable disease. Patient was discussed with and also seen by Dr. [name]. We will proceed with cycle #5 today as a scheduled. The patient return for a follow-up visit in 2 weeks for reevaluation. He was advised to call immediately if he has any concerning symptoms in the interval. The patient voices understanding of current disease status and treatment options and is in agreement with the current care plan.  All questions were answered. The patient knows to call the clinic with any problems, questions or concerns. We can certainly see the patient much sooner if necessary.  Carlton Adam, PA-C 04/27/2015   ADDENDUM: Hematology/Oncology Attending: I had a face to face encounter with the patient. I recommended his care plan. This is a very pleasant 64 years old African-American male with recurrent non-small cell lung cancer and currently undergoing treatment with immunotherapy with Nivolumab status post 4 cycles. He tolerated his treatment well with no significant adverse effects. The recent restaging CT scan of the chest showed no evidence for disease progression. I discussed the scan results with the patient and his caregiver. I recommended for him to continue his current treatment with Nivolumab. He'll start cycle #5 today. The patient would come  back for follow-up visit in 2 weeks for reevaluation before starting the next cycle of his treatment. He was advised to call immediately if he has any concerning symptoms in the interval.  Disclaimer: This note was dictated with voice recognition software. Similar sounding words can inadvertently be transcribed and may not be corrected upon review. Eilleen Kempf., MD 05/04/2015

## 2015-04-28 ENCOUNTER — Ambulatory Visit (HOSPITAL_BASED_OUTPATIENT_CLINIC_OR_DEPARTMENT_OTHER): Payer: Medicare Other

## 2015-04-28 VITALS — BP 107/76 | HR 94 | Temp 97.3°F | Resp 18

## 2015-04-28 DIAGNOSIS — C3492 Malignant neoplasm of unspecified part of left bronchus or lung: Secondary | ICD-10-CM

## 2015-04-28 DIAGNOSIS — C3412 Malignant neoplasm of upper lobe, left bronchus or lung: Secondary | ICD-10-CM

## 2015-04-28 DIAGNOSIS — Z5112 Encounter for antineoplastic immunotherapy: Secondary | ICD-10-CM

## 2015-04-28 MED ORDER — SODIUM CHLORIDE 0.9 % IV SOLN
3.0000 mg/kg | Freq: Once | INTRAVENOUS | Status: AC
  Administered 2015-04-28: 200 mg via INTRAVENOUS
  Filled 2015-04-28: qty 20

## 2015-04-28 MED ORDER — SODIUM CHLORIDE 0.9 % IV SOLN
Freq: Once | INTRAVENOUS | Status: AC
  Administered 2015-04-28: 15:00:00 via INTRAVENOUS

## 2015-04-28 NOTE — Patient Instructions (Signed)
[facility] Discharge Instructions for Patients Receiving Chemotherapy  Today you received the following chemotherapy agents:  Nivolumab.  To help prevent nausea and vomiting after your treatment, we encourage you to take your nausea medication as directed.   If you develop nausea and vomiting that is not controlled by your nausea medication, call the clinic.   BELOW ARE SYMPTOMS THAT SHOULD BE REPORTED IMMEDIATELY:  *FEVER GREATER THAN 100.5 F  *CHILLS WITH OR WITHOUT FEVER  NAUSEA AND VOMITING THAT IS NOT CONTROLLED WITH YOUR NAUSEA MEDICATION  *UNUSUAL SHORTNESS OF BREATH  *UNUSUAL BRUISING OR BLEEDING  TENDERNESS IN MOUTH AND THROAT WITH OR WITHOUT PRESENCE OF ULCERS  *URINARY PROBLEMS  *BOWEL PROBLEMS  UNUSUAL RASH Items with * indicate a potential emergency and should be followed up as soon as possible.  Feel free to call the clinic you have any questions or concerns. The clinic phone number is [phone].  Please show the CHEMO ALERT CARD at check-in to the Emergency Department and triage nurse.   

## 2015-05-03 NOTE — Patient Instructions (Signed)
Your recent restaging CT scan revealed stable disease Follow-up in 2 weeks prior to your next scheduled cycle of immunotherapy

## 2015-05-05 NOTE — Progress Notes (Signed)
PT G-Code Note    04/19/15 1200  PT G-Codes **NOT FOR INPATIENT CLASS**  Functional Assessment Tool Used Clinical Judgement  Functional Limitation Mobility: Walking and moving around  Mobility: Walking and Moving Around Current Status [phone]) CJ  Mobility: Walking and Moving Around Goal Status [phone]) CI  [name], [name] [phone] 05/05/2015

## 2015-05-06 ENCOUNTER — Telehealth: Payer: Self-pay | Admitting: Neurology

## 2015-05-06 NOTE — Telephone Encounter (Signed)
I called and left a message. I recommend patient stays on the [place] and does not decrease the medication. Thereasa Parkin

## 2015-05-06 NOTE — Telephone Encounter (Signed)
Called and spoke to [place]. He stated pt was put on Keppra '750mg'$  2 times daily (2 tabs) when he was in the hospital. He started the medication on 04/28/15. The last 3-4 days he has been laying around a lot and is not doing much. He is not as "sharp" as he used to be. He is also receiving chemotherapy right now. His last treatment was 04/28/15 and next one is on 05/11/15. He is not sure if this is a cause of his decreased strength. He is able to respond to questions. Advised that Dr. Jaynee Eagles is with a pt right now. Advised him that he is not on the HIPPA [facility]) form for [place] to be able to disclose health information/advise to and we will have to contact Willaim Sheng (his sister) to discuss this matter since she is listed on DPR form. A new form will have to be filled out in the office for him to be added. He verbalized understanding and will let his sister know we will be calling her.

## 2015-05-06 NOTE — Telephone Encounter (Signed)
Patient's caregiver [name] called stating pt's Keppra was increased to '750mg'$  2tab twice daily per hospital stay. He has noticed decreased strength and lethargic, laying down a lot more, staring not responding as quickly. Please call and advise. He can be reached at [phone].

## 2015-05-11 ENCOUNTER — Other Ambulatory Visit (HOSPITAL_BASED_OUTPATIENT_CLINIC_OR_DEPARTMENT_OTHER): Payer: Medicare Other

## 2015-05-11 ENCOUNTER — Encounter: Payer: Self-pay | Admitting: Physician Assistant

## 2015-05-11 ENCOUNTER — Ambulatory Visit: Payer: Medicare Other

## 2015-05-11 ENCOUNTER — Ambulatory Visit (HOSPITAL_BASED_OUTPATIENT_CLINIC_OR_DEPARTMENT_OTHER): Payer: Medicare Other | Admitting: Physician Assistant

## 2015-05-11 ENCOUNTER — Telehealth: Payer: Self-pay | Admitting: Physician Assistant

## 2015-05-11 ENCOUNTER — Telehealth: Payer: Self-pay | Admitting: *Deleted

## 2015-05-11 VITALS — BP 117/87 | HR 122 | Temp 98.3°F | Resp 18 | Ht 70.0 in | Wt 141.4 lb

## 2015-05-11 DIAGNOSIS — C3412 Malignant neoplasm of upper lobe, left bronchus or lung: Secondary | ICD-10-CM | POA: Diagnosis present

## 2015-05-11 DIAGNOSIS — Z79899 Other long term (current) drug therapy: Secondary | ICD-10-CM | POA: Diagnosis not present

## 2015-05-11 DIAGNOSIS — C3490 Malignant neoplasm of unspecified part of unspecified bronchus or lung: Secondary | ICD-10-CM

## 2015-05-11 LAB — CBC WITH DIFFERENTIAL/PLATELET
BASO%: 1.7 % (ref 0.0–2.0)
Basophils Absolute: 0.2 10*3/uL — ABNORMAL HIGH (ref 0.0–0.1)
EOS%: 0.2 % (ref 0.0–7.0)
Eosinophils Absolute: 0 10*3/uL (ref 0.0–0.5)
HEMATOCRIT: 44.9 % (ref 38.4–49.9)
HEMOGLOBIN: 14.4 g/dL (ref 13.0–17.1)
LYMPH#: 1.3 10*3/uL (ref 0.9–3.3)
LYMPH%: 13.1 % — ABNORMAL LOW (ref 14.0–49.0)
MCH: 28.2 pg (ref 27.2–33.4)
MCHC: 32.1 g/dL (ref 32.0–36.0)
MCV: 87.7 fL (ref 79.3–98.0)
MONO#: 0.7 10*3/uL (ref 0.1–0.9)
MONO%: 7.3 % (ref 0.0–14.0)
NEUT#: 7.6 10*3/uL — ABNORMAL HIGH (ref 1.5–6.5)
NEUT%: 77.7 % — ABNORMAL HIGH (ref 39.0–75.0)
PLATELETS: 263 10*3/uL (ref 140–400)
RBC: 5.12 10*6/uL (ref 4.20–5.82)
RDW: 14.1 % (ref 11.0–14.6)
WBC: 9.8 10*3/uL (ref 4.0–10.3)

## 2015-05-11 LAB — COMPREHENSIVE METABOLIC PANEL (CC13)
ALBUMIN: 3.8 g/dL (ref 3.5–5.0)
ALK PHOS: 82 U/L (ref 40–150)
ALT: 15 U/L (ref 0–55)
AST: 9 U/L (ref 5–34)
Anion Gap: 14 mEq/L — ABNORMAL HIGH (ref 3–11)
BILIRUBIN TOTAL: 0.24 mg/dL (ref 0.20–1.20)
BUN: 10.7 mg/dL (ref 7.0–26.0)
CO2: 27 mEq/L (ref 22–29)
CREATININE: 1 mg/dL (ref 0.7–1.3)
Calcium: 10 mg/dL (ref 8.4–10.4)
Chloride: 106 mEq/L (ref 98–109)
EGFR: >90 mL/min/{1.73_m2} (ref >90)
GLUCOSE: 96 mg/dL (ref 70–140)
Potassium: 3.5 mEq/L (ref 3.5–5.1)
SODIUM: 147 meq/L — AB (ref 136–145)
TOTAL PROTEIN: 7.8 g/dL (ref 6.4–8.3)

## 2015-05-11 LAB — TSH CHCC: TSH: 0.421 m(IU)/L (ref 0.320–4.118)

## 2015-05-11 NOTE — Telephone Encounter (Signed)
per pof to sch pt appt-per Adrena to sch w/ her on 9/16 and keep inf on 9/20-sent MW email to sch trmt on 9/20-will call pt guardian once reply w/time

## 2015-05-11 NOTE — Progress Notes (Addendum)
[place] [phone]   [phone]  OFFICE PROGRESS NOTE  [name],[name], MD [address] [place] [address]  DIAGNOSIS: Stage IIA (T2a., N1, M0) non-small cell lung cancer, squamous cell carcinoma presented with central left upper lobe mass as well as hilar lymphadenopathy diagnosed in January of 2015.  PRIOR THERAPY:  1. Concurrent chemoradiation with weekly carboplatin for AUC of 2 and paclitaxel 45 mg/M2, status post 7 cycles. Last cycle was given 12/22/2013  2. Systemic chemotherapy with carboplatin for AUC of 5 and paclitaxel 175 MG/M2 with Neulasta support every 3 weeks. Status post 3 cycles  CURRENT THERAPY: Immunotherapy with Nivolumab 3 mg/kg given every 2 weeks. Status post 5 cycles.  INTERVAL HISTORY: Marco [name] 64 y.o. male returns to the clinic today for followup visit accompanied by his caregiver. He is currently receiving treatment with immunotherapy with nivolumab, status post 5 cycles. According to the his caregiver his recent increase in [place] has him a little "out of it". He was advised to contact neurologist as they are the ones controlling the dosages of Keppra. He's had no recent seizures.  He is tolerating his treatment with immunotherapy without difficulty. He denied having any nausea or vomiting, no fever or chills. He denied having any chest pain, shortness of breath, or hemoptysis. He denied having any significant weight loss or night sweats. He denied any diarrhea, skin rashes or change in his baseline breathing. He is here today to start cycle #5.   MEDICAL HISTORY: Past Medical History  Diagnosis Date  . Mental disorder     sczizophrenia;moderate retardation  . Depression   . DEMENTIA   . Hypertension     no medications, no documented history per caregiver at preadmission  . GERD (gastroesophageal reflux disease)   . Seizures   . Developmental disability     developmentaly delayed  .  Hx of radiation therapy 11/07/13-12/24/13    lung,60Gy/9f  . Mental retardation   . Seizure disorder   . Lung cancer   . Encounter for antineoplastic immunotherapy 04/05/2015    ALLERGIES:  has No Known Allergies.  MEDICATIONS:  Current Outpatient Prescriptions  Medication Sig Dispense Refill  . bisacodyl (DULCOLAX) 5 MG EC tablet Take 5 mg by mouth daily as needed for moderate constipation (constipation).     .Marland Kitchendonepezil (ARICEPT) 10 MG tablet Take 10 mg by mouth at bedtime.     . fluticasone (FLONASE) 50 MCG/ACT nasal spray Place 2 sprays into both nostrils daily.    .Marland KitchenlevETIRAcetam (KEPPRA) 750 MG tablet Take 2 tablets (1,500 mg total) by mouth 2 (two) times daily. 60 tablet 0  . LORazepam (ATIVAN) 1 MG tablet Take 2 mg by mouth at bedtime.    .Marland Kitchenomeprazole (PRILOSEC) 20 MG capsule Take 20 mg by mouth 2 (two) times daily.    . risperiDONE (RISPERDAL) 3 MG tablet Take 3 mg by mouth at bedtime.    . traZODone (DESYREL) 50 MG tablet Take 25 mg by mouth at bedtime.      No current facility-administered medications for this visit.    SURGICAL HISTORY:  Past Surgical History  Procedure Laterality Date  . Cataract extraction w/phaco  07/12/2011    Procedure: CATARACT EXTRACTION PHACO AND INTRAOCULAR LENS PLACEMENT (IOC);  Surgeon: GAdonis Brook  [facility];  Service: Ophthalmology;  Laterality: Left;  .Marland KitchenEye surgery      L eye  . Video bronchoscopy Bilateral 10/01/2013  Procedure: VIDEO BRONCHOSCOPY WITHOUT FLUORO;  Surgeon: Collene Gobble, MD;  Location: [place];  Service: Cardiopulmonary;  Laterality: Bilateral;    REVIEW OF SYSTEMS:  A comprehensive review of systems was negative.   PHYSICAL EXAMINATION: General appearance: alert, cooperative and no distress Head: Normocephalic, without obvious abnormality, atraumatic Neck: no adenopathy, no JVD, supple, symmetrical, trachea midline and thyroid not enlarged, symmetric, no tenderness/mass/nodules Lymph nodes: Cervical,  supraclavicular, and axillary nodes normal. Resp: clear to auscultation bilaterally Back: symmetric, no curvature. ROM normal. No CVA tenderness. Cardio: regular rate and rhythm, S1, S2 normal, no murmur, click, rub or gallop GI: soft, non-tender; bowel sounds normal; no masses,  no organomegaly Extremities: extremities normal, atraumatic, no cyanosis or edema Neurologic: Alert and oriented X 3, normal strength and tone. Normal symmetric reflexes. Normal coordination and gait  ECOG PERFORMANCE STATUS: 1 - Symptomatic but completely ambulatory  Blood pressure 117/87, pulse 122, temperature 98.3 F (36.8 C), temperature source Oral, resp. rate 18, height '5\' 10"'$  (1.778 m), weight 141 lb 6.4 oz (64.139 kg), SpO2 100 %.  LABORATORY DATA: Lab Results  Component Value Date   WBC 9.8 05/11/2015   HGB 14.4 05/11/2015   HCT 44.9 05/11/2015   MCV 87.7 05/11/2015   PLT 263 05/11/2015      Chemistry      Component Value Date/Time   NA 147* 05/11/2015 1123   NA 140 04/21/2015 0227   K 3.5 05/11/2015 1123   K 3.5 04/21/2015 0227   CL 107 04/21/2015 0227   CO2 27 05/11/2015 1123   CO2 25 04/21/2015 0227   BUN 10.7 05/11/2015 1123   BUN 7 04/21/2015 0227   CREATININE 1.0 05/11/2015 1123   CREATININE 0.85 04/21/2015 0227      Component Value Date/Time   CALCIUM 10.0 05/11/2015 1123   CALCIUM 8.6* 04/21/2015 0227   ALKPHOS 82 05/11/2015 1123   ALKPHOS 62 04/21/2015 0227   AST 9 05/11/2015 1123   AST 16 04/21/2015 0227   ALT 15 05/11/2015 1123   ALT 14* 04/21/2015 0227   BILITOT 0.24 05/11/2015 1123   BILITOT 0.6 04/21/2015 0227       RADIOGRAPHIC STUDIES:  ASSESSMENT AND PLAN: This is a very pleasant 64 years old [place] American male with unresectable a stage IIA non-small cell lung cancer. He completed a course of concurrent chemoradiation with weekly carboplatin and paclitaxel is status post 7 cycles. Has been observation since his treatment in April 2015.  His restaging CT  scan of the chest showed further enlargement of the left upper lobe suprahilar mass concerning for disease recurrence. The patient was treated with 3 cycles of systemic chemotherapy was carboplatin and paclitaxel but unfortunately has evidence for disease progression. He was started on treatment with immunotherapy with Nivolumab status post 5 cycles and tolerating his treatment fairly well. His recent restaging CT scan revealed stable disease. Patient was discussed with and also seen by Dr. [name]. We will proceed with cycle #6 today as a scheduled. The patient return for a follow-up visit in 2 weeks for reevaluation. He was advised to call immediately if he has any concerning symptoms in the interval. The patient voices understanding of current disease status and treatment options and is in agreement with the current care plan.  All questions were answered. The patient knows to call the clinic with any problems, questions or concerns. We can certainly see the patient much sooner if necessary.  Carlton Adam, PA-C 05/11/2015  ADDENDUM: Hematology/Oncology Attending: I  had a face to face encounter with the patient. I recommended his care plan. This is a very pleasant 64 years old African-American male with progressive non-small cell lung cancer, squamous cell carcinoma was currently undergoing treatment with immunotherapy with Nivolumab status post 5 cycles. The patient has been tolerating his treatment fairly well but sometimes he is irritable at the chemotherapy infusion room and he could not continue his treatment last week. His caregiver was unable to control him at that time. We will arrange for the patient to proceed with cycle #6 today as a scheduled he would come back for follow-up visit in 2 weeks for reevaluation before starting the next cycle of his treatment. He was advised to call immediately if he has any concerning symptoms in the interval.  Disclaimer: This note was dictated with  voice recognition software. Similar sounding words can inadvertently be transcribed and may be missed upon review. Eilleen Kempf., MD 05/12/2015

## 2015-05-11 NOTE — Patient Instructions (Signed)
Follow up in 2 weeks, prior to your next scheduled cycle of immunotherapy 

## 2015-05-11 NOTE — Telephone Encounter (Signed)
Per staff message and POF I have scheduled appts. Advised scheduler of appts. JMW  

## 2015-05-20 ENCOUNTER — Encounter: Payer: Self-pay | Admitting: Neurology

## 2015-05-20 ENCOUNTER — Other Ambulatory Visit: Payer: Self-pay | Admitting: Medical Oncology

## 2015-05-20 ENCOUNTER — Ambulatory Visit (INDEPENDENT_AMBULATORY_CARE_PROVIDER_SITE_OTHER): Payer: Medicare Other | Admitting: Neurology

## 2015-05-20 ENCOUNTER — Encounter: Payer: Self-pay | Admitting: *Deleted

## 2015-05-20 VITALS — BP 103/67 | HR 108 | Ht 70.0 in | Wt 148.0 lb

## 2015-05-20 DIAGNOSIS — C3490 Malignant neoplasm of unspecified part of unspecified bronchus or lung: Secondary | ICD-10-CM | POA: Diagnosis not present

## 2015-05-20 DIAGNOSIS — G40901 Epilepsy, unspecified, not intractable, with status epilepticus: Secondary | ICD-10-CM

## 2015-05-20 DIAGNOSIS — F0390 Unspecified dementia without behavioral disturbance: Secondary | ICD-10-CM | POA: Diagnosis not present

## 2015-05-20 DIAGNOSIS — G40301 Generalized idiopathic epilepsy and epileptic syndromes, not intractable, with status epilepticus: Secondary | ICD-10-CM | POA: Diagnosis not present

## 2015-05-20 DIAGNOSIS — R569 Unspecified convulsions: Secondary | ICD-10-CM | POA: Insufficient documentation

## 2015-05-20 NOTE — Progress Notes (Signed)
[facility]    Provider:  Dr Jaynee Eagles Referring Provider: Leamon Arnt, MD Primary Care Physician:  Leamon Arnt, MD  [place]    Provider: Dr Jaynee Eagles Referring Provider: Leamon Arnt, MD Primary Care Physician: Leamon Arnt, MD  CC: Seizures  05/20/2015: He is doing better. He was increased to Keppra '1500mg'$  twice a day after a breakthrough seizure and admission with intubation in August. No seizures since August 14th. No inciting events, no illnesses or other trigger for the breakthrough. Everything is better. However sometimes when brother  speaks to patient, patient doesn't hear what the brother says and the brother has to repeat himself. The patient just stares at brother. Brother wonders if he is having seizures, he blank stares a lot. Patient is tired and not so sharp but he is going through chemotherapy currently. Brother notices it 2-3x a day at least.   MRI 04/2015:personally reviewed images and agree, no intracranial metastasis 1. No acute intracranial process identified. No evidence for intracranial metastasis. 2. Mild to moderate generalized cerebral atrophy  HPI: Marco [name] is a 64 y.o. male here as a follow up. He is a former patient of dr. Janann Colonel. He has a PMHx of seizures, non-small cell lung cancer s/p chemo and radiation, mental retardation.   Patient is with aid who works in the group home where patient resides. Aid provides all information. Patient is on [place]. It was increased to '1000mg'$  twice a day in February when he had seizures. Last seizure was a month ago. Feb 3rd. Patient was coming downstairs. Seizures just sits there and looks dazed. Doesn't move at all. He stares for 2 minutes per aid (hospital notes report GTCS as well). The Keppra is working. The last time he had a seizure in February was due to illness. Otherwise the medication has helped. He is alert, mood is good, not irritable. Everything is going  well.   Reviewed notes, labs and imaging from outside physicians, which showed:  Patient had new onset seizures in 2014. Baseline MR. Lives in a group home. Has had GTCS and episodes of staring. He was hospitalized in February after GTCS. He was persistently post ictal   MRI of the brain 10/09/2014 (personally reviewed and agree with findings): Edema and low level restricted diffusion in the hippocampus/mesial temporal lobe on the left, most typical of postictal change. The patient could be restudied in the future distant from any seizure event to see if this area has a normalized appearance or any definable permanent underlying abnormality. CBC/CMP unremarkable HgbA1c 5.6  EEG 10/08/2014: IMPRESSION: This is an abnormal electroencephalogram secondary to general  background slowing. This may be seen with a diffuse disturbance  that is etiologically nonspecific, but may include the post-ictal  state, among other possibilities. There were also frequent sharp  transients over the left hemisphere with phase reversal at [facility]  consistent with the patient's history of seizures.     Review of Systems: Patient complains of symptoms per HPI as well as the following symptoms: drooling, fatigue, fever, headache, seizure, weakness, facial drooping. Pertinent negatives per HPI. All others negative. Review of Systems: Patient complains of symptoms per HPI as well as the following symptoms:seizure,weakness. Pertinent negatives per HPI. All others negative.   Social History   Social History  . Marital Status: Single    Spouse Name: N/A  . Number of Children: 0  . Years of Education: <12   Occupational History  . Not on file.  Social History Main Topics  . Smoking status: Former Smoker -- 0.50 packs/day for 35 years    Types: Cigarettes    Quit date: 09/07/2013  . Smokeless tobacco: Not on file  . Alcohol Use: No  . Drug Use: No  . Sexual Activity: No   Other Topics Concern  . Not  on file   Social History Narrative   ** Merged History Encounter **       ** Merged History Encounter **       Patient lives in a group with 3 other brothers.    Patient does not work.   Patient is single.    Patient has some HS education.    Patient has no children.    Patient has a history of psychotic disorder NOS and mental retardation.    Caffeine: Drinks 3 cups coffee per day.     Family History  Problem Relation Age of Onset  . Hypertension Mother   . Hypertension Father     Past Medical History  Diagnosis Date  . Mental disorder     sczizophrenia;moderate retardation  . Depression   . DEMENTIA   . Hypertension     no medications, no documented history per caregiver at preadmission  . GERD (gastroesophageal reflux disease)   . Seizures   . Developmental disability     developmentaly delayed  . Hx of radiation therapy 11/07/13-12/24/13    lung,60Gy/4f  . Mental retardation   . Seizure disorder   . Lung cancer   . Encounter for antineoplastic immunotherapy 04/05/2015    Past Surgical History  Procedure Laterality Date  . Cataract extraction w/phaco  07/12/2011    Procedure: CATARACT EXTRACTION PHACO AND INTRAOCULAR LENS PLACEMENT (IOC);  Surgeon: GAdonis Brook  [facility];  Service: Ophthalmology;  Laterality: Left;  .Marland KitchenEye surgery      L eye  . Video bronchoscopy Bilateral 10/01/2013    Procedure: VIDEO BRONCHOSCOPY WITHOUT FLUORO;  Surgeon: RCollene Gobble MD;  [facility] [facility]  Service: Cardiopulmonary;  Laterality: Bilateral;    Current Outpatient Prescriptions  Medication Sig Dispense Refill  . bisacodyl (DULCOLAX) 5 MG EC tablet Take 5 mg by mouth daily as needed for moderate constipation (constipation).     .Marland Kitchendonepezil (ARICEPT) 10 MG tablet Take 10 mg by mouth at bedtime.     . fluticasone (FLONASE) 50 MCG/ACT nasal spray Place 2 sprays into both nostrils daily.    .Marland KitchenlevETIRAcetam (KEPPRA) 750 MG tablet Take 2 tablets (1,500 mg total) by  mouth 2 (two) times daily. 60 tablet 0  . LORazepam (ATIVAN) 1 MG tablet Take 2 mg by mouth at bedtime.    .Marland Kitchenomeprazole (PRILOSEC) 20 MG capsule Take 20 mg by mouth 2 (two) times daily.    . risperiDONE (RISPERDAL) 3 MG tablet Take 3 mg by mouth at bedtime.    . traZODone (DESYREL) 50 MG tablet Take 25 mg by mouth at bedtime.      No current facility-administered medications for this visit.    Allergies as of 05/20/2015  . (No Known Allergies)    Vitals: BP 103/67 mmHg  Pulse 108  Ht '5\' 10"'$  (1.778 m)  Wt 148 lb (67.132 kg)  BMI 21.24 kg/m2 Last Weight:  Wt Readings from Last 1 Encounters:  05/20/15 148 lb (67.132 kg)   Last Height:   Ht Readings from Last 1 Encounters:  05/20/15 '5\' 10"'$  (1.778 m)     Speech:  Speech is dysarthric, no  aphasia Cognition:  The patient is oriented to person, place  Cranial Nerves:  The pupils are equal, round, and reactive to light. Visual fields are full to threat. Extraocular movements are intact.  Hearing intact to voice. Voice is normal. Shoulder shrug is normal. The tongue has normal motion without fasciculations.  Gait:  heel-toe intact  Motor Observation:  No asymmetry, no atrophy, and no involuntary movements noted. Tone:  Normal muscle tone.   Posture:  Posture is normal. normal erect   Strength:  Strength is V/V in the upper and lower limbs.       Assessment/Plan: Mr. Pavek is a pleasant 64 year old gentleman with a history of mental retardation, schizophrenia and dementia presenting for follow up evaluation for seizure disorder. Had breakthrough event resulting in status epilepticus and hospital admission in the setting of illness last February an was recently admitte for another seizure.Marland Kitchen Keppra increased to 1500 BID, whe is having staring spells and possibly partial seizures. Found to have lung mass, currently in treatment. Will continue keppra at current dose for now. Will order an ambulatory  EEG to evaluate these reported daily events.  Dx: Seizure Schizophrenia Acute respiratory failure with hypoxia Developmental disability Dementia Non-small cell lung cancer Essential hypertension   Please call [phone] for all scheduling. 24-hour ambulatory eeg.  Continue Keppra for seizures Aricept for memory Risperidone qhs Trazodone qhs   Sarina Ill, MD  [facility] [address] [place], [place] [address]  Phone [phone] Fax [phone]  A total of 30 minutes was spent face-to-face with this patient. Over half this time was spent on counseling patient on the seizure diagnosis and different diagnostic and therapeutic options available.   Sarina Ill, MD

## 2015-05-20 NOTE — Progress Notes (Signed)
Faxed referral for 24-hr ambulatory EEG with digital seizure and spike analysis to [facility] at [phone]. Received fax confirmation.

## 2015-05-21 ENCOUNTER — Telehealth: Payer: Self-pay | Admitting: Internal Medicine

## 2015-05-21 ENCOUNTER — Encounter: Payer: Self-pay | Admitting: Physician Assistant

## 2015-05-21 ENCOUNTER — Other Ambulatory Visit (HOSPITAL_BASED_OUTPATIENT_CLINIC_OR_DEPARTMENT_OTHER): Payer: Medicare Other

## 2015-05-21 ENCOUNTER — Telehealth: Payer: Self-pay | Admitting: *Deleted

## 2015-05-21 ENCOUNTER — Ambulatory Visit (HOSPITAL_BASED_OUTPATIENT_CLINIC_OR_DEPARTMENT_OTHER): Payer: Medicare Other | Admitting: Physician Assistant

## 2015-05-21 VITALS — BP 111/68 | HR 88 | Temp 97.8°F | Resp 18 | Ht 70.0 in | Wt 145.8 lb

## 2015-05-21 DIAGNOSIS — C3490 Malignant neoplasm of unspecified part of unspecified bronchus or lung: Secondary | ICD-10-CM

## 2015-05-21 LAB — COMPREHENSIVE METABOLIC PANEL (CC13)
ALT: 7 U/L (ref 0–55)
ANION GAP: 9 meq/L (ref 3–11)
AST: 10 U/L (ref 5–34)
Albumin: 3.6 g/dL (ref 3.5–5.0)
Alkaline Phosphatase: 79 U/L (ref 40–150)
BUN: 13 mg/dL (ref 7.0–26.0)
CO2: 26 meq/L (ref 22–29)
Calcium: 9.1 mg/dL (ref 8.4–10.4)
Chloride: 107 mEq/L (ref 98–109)
Creatinine: 0.9 mg/dL (ref 0.7–1.3)
GLUCOSE: 88 mg/dL (ref 70–140)
POTASSIUM: 4.1 meq/L (ref 3.5–5.1)
SODIUM: 142 meq/L (ref 136–145)
Total Bilirubin: 0.29 mg/dL (ref 0.20–1.20)
Total Protein: 7.1 g/dL (ref 6.4–8.3)

## 2015-05-21 LAB — CBC WITH DIFFERENTIAL/PLATELET
BASO%: 0.4 % (ref 0.0–2.0)
BASOS ABS: 0 10*3/uL (ref 0.0–0.1)
EOS ABS: 0.1 10*3/uL (ref 0.0–0.5)
EOS%: 0.8 % (ref 0.0–7.0)
HCT: 41.7 % (ref 38.4–49.9)
HGB: 13.4 g/dL (ref 13.0–17.1)
LYMPH%: 15.7 % (ref 14.0–49.0)
MCH: 28.3 pg (ref 27.2–33.4)
MCHC: 32.1 g/dL (ref 32.0–36.0)
MCV: 88 fL (ref 79.3–98.0)
MONO#: 0.8 10*3/uL (ref 0.1–0.9)
MONO%: 10.9 % (ref 0.0–14.0)
NEUT#: 5.4 10*3/uL (ref 1.5–6.5)
NEUT%: 72.2 % (ref 39.0–75.0)
PLATELETS: 174 10*3/uL (ref 140–400)
RBC: 4.74 10*6/uL (ref 4.20–5.82)
RDW: 13.9 % (ref 11.0–14.6)
WBC: 7.4 10*3/uL (ref 4.0–10.3)
lymph#: 1.2 10*3/uL (ref 0.9–3.3)

## 2015-05-21 NOTE — Progress Notes (Signed)
[place] [phone]   [phone]  OFFICE PROGRESS NOTE  [name],[name], MD [address] [place] [address]  DIAGNOSIS: Stage IIA (T2a., N1, M0) non-small cell lung cancer, squamous cell carcinoma presented with central left upper lobe mass as well as hilar lymphadenopathy diagnosed in January of 2015.  PRIOR THERAPY:  1. Concurrent chemoradiation with weekly carboplatin for AUC of 2 and paclitaxel 45 mg/M2, status post 7 cycles. Last cycle was given 12/22/2013  2. Systemic chemotherapy with carboplatin for AUC of 5 and paclitaxel 175 MG/M2 with Neulasta support every 3 weeks. Status post 3 cycles  CURRENT THERAPY: Immunotherapy with Nivolumab 3 mg/kg given every 2 weeks. Status post 5 cycles.  INTERVAL HISTORY: Marco [name] 64 y.o. male returns to the clinic today for followup visit accompanied by his caregiver. He is currently receiving treatment with immunotherapy with nivolumab, status post 5 cycles. According to the his caregiver his behavior precluded receiving cycle number 6 as originally scheduled. It has been rescheduled for 05/25/2015. He voices no specific complaints today. He is tolerating his treatment with immunotherapy without difficulty. He denied having any nausea or vomiting, no fever or chills. He denied having any chest pain, shortness of breath, or hemoptysis. He denied having any significant weight loss or night sweats. He denied any diarrhea, skin rashes or change in his baseline breathing. He is here today for evaluation prior to the start of cycle #6.   MEDICAL HISTORY: Past Medical History  Diagnosis Date  . Mental disorder     sczizophrenia;moderate retardation  . Depression   . DEMENTIA   . Hypertension     no medications, no documented history per caregiver at preadmission  . GERD (gastroesophageal reflux disease)   . Seizures   . Developmental disability     developmentaly delayed  . Hx  of radiation therapy 11/07/13-12/24/13    lung,60Gy/34f  . Mental retardation   . Seizure disorder   . Lung cancer   . Encounter for antineoplastic immunotherapy 04/05/2015    ALLERGIES:  has No Known Allergies.  MEDICATIONS:  Current Outpatient Prescriptions  Medication Sig Dispense Refill  . bisacodyl (DULCOLAX) 5 MG EC tablet Take 5 mg by mouth daily as needed for moderate constipation (constipation).     .Marland Kitchendonepezil (ARICEPT) 10 MG tablet Take 10 mg by mouth at bedtime.     . fluticasone (FLONASE) 50 MCG/ACT nasal spray Place 2 sprays into both nostrils daily.    .Marland KitchenlevETIRAcetam (KEPPRA) 750 MG tablet Take 2 tablets (1,500 mg total) by mouth 2 (two) times daily. 60 tablet 0  . LORazepam (ATIVAN) 1 MG tablet Take 2 mg by mouth at bedtime.    .Marland Kitchenomeprazole (PRILOSEC) 20 MG capsule Take 20 mg by mouth 2 (two) times daily.    . risperiDONE (RISPERDAL) 3 MG tablet Take 3 mg by mouth at bedtime.    . traZODone (DESYREL) 50 MG tablet Take 25 mg by mouth at bedtime.      No current facility-administered medications for this visit.    SURGICAL HISTORY:  Past Surgical History  Procedure Laterality Date  . Cataract extraction w/phaco  07/12/2011    Procedure: CATARACT EXTRACTION PHACO AND INTRAOCULAR LENS PLACEMENT (IOC);  Surgeon: GAdonis Brook  [facility];  Service: Ophthalmology;  Laterality: Left;  .Marland KitchenEye surgery      L eye  . Video bronchoscopy Bilateral 10/01/2013    Procedure: VIDEO BRONCHOSCOPY WITHOUT  FLUORO;  Surgeon: Collene Gobble, MD;  Location: [facility];  Service: Cardiopulmonary;  Laterality: Bilateral;    REVIEW OF SYSTEMS:  A comprehensive review of systems was negative.   PHYSICAL EXAMINATION: General appearance: alert, cooperative and no distress Head: Normocephalic, without obvious abnormality, atraumatic Neck: no adenopathy, no JVD, supple, symmetrical, trachea midline and thyroid not enlarged, symmetric, no tenderness/mass/nodules Lymph nodes: Cervical,  supraclavicular, and axillary nodes normal. Resp: clear to auscultation bilaterally Back: symmetric, no curvature. ROM normal. No CVA tenderness. Cardio: regular rate and rhythm, S1, S2 normal, no murmur, click, rub or gallop GI: soft, non-tender; bowel sounds normal; no masses,  no organomegaly Extremities: extremities normal, atraumatic, no cyanosis or edema Neurologic: Alert and oriented X 3, normal strength and tone. Normal symmetric reflexes. Normal coordination and gait  ECOG PERFORMANCE STATUS: 1 - Symptomatic but completely ambulatory  Blood pressure 111/68, pulse 88, temperature 97.8 F (36.6 C), temperature source Oral, resp. rate 18, height '5\' 10"'$  (1.778 m), weight 145 lb 12.8 oz (66.134 kg), SpO2 100 %.  LABORATORY DATA: Lab Results  Component Value Date   WBC 7.4 05/21/2015   HGB 13.4 05/21/2015   HCT 41.7 05/21/2015   MCV 88.0 05/21/2015   PLT 174 05/21/2015      Chemistry      Component Value Date/Time   NA 142 05/21/2015 0957   NA 140 04/21/2015 0227   K 4.1 05/21/2015 0957   K 3.5 04/21/2015 0227   CL 107 04/21/2015 0227   CO2 26 05/21/2015 0957   CO2 25 04/21/2015 0227   BUN 13.0 05/21/2015 0957   BUN 7 04/21/2015 0227   CREATININE 0.9 05/21/2015 0957   CREATININE 0.85 04/21/2015 0227      Component Value Date/Time   CALCIUM 9.1 05/21/2015 0957   CALCIUM 8.6* 04/21/2015 0227   ALKPHOS 79 05/21/2015 0957   ALKPHOS 62 04/21/2015 0227   AST 10 05/21/2015 0957   AST 16 04/21/2015 0227   ALT 7 05/21/2015 0957   ALT 14* 04/21/2015 0227   BILITOT 0.29 05/21/2015 0957   BILITOT 0.6 04/21/2015 0227       RADIOGRAPHIC STUDIES:  ASSESSMENT AND PLAN: This is a very pleasant 64 years old [place] American male with unresectable a stage IIA non-small cell lung cancer. He completed a course of concurrent chemoradiation with weekly carboplatin and paclitaxel is status post 7 cycles. Has been observation since his treatment in April 2015.  His restaging CT  scan of the chest showed further enlargement of the left upper lobe suprahilar mass concerning for disease recurrence. The patient was treated with 3 cycles of systemic chemotherapy was carboplatin and paclitaxel but unfortunately has evidence for disease progression. He was started on treatment with immunotherapy with Nivolumab status post 5 cycles and tolerating his treatment fairly well. His recent restaging CT scan revealed stable disease. We will proceed with cycle #6 as a scheduled. The patient return for a follow-up visit in 2 weeks for reevaluation. He was advised to call immediately if he has any concerning symptoms in the interval. The patient voices understanding of current disease status and treatment options and is in agreement with the current care plan.  All questions were answered. The patient knows to call the clinic with any problems, questions or concerns. We can certainly see the patient much sooner if necessary.  Carlton Adam, PA-C 05/21/2015

## 2015-05-21 NOTE — Telephone Encounter (Signed)
Per staff message and POF I have scheduled appts. Advised scheduler of appts. JMW  

## 2015-05-21 NOTE — Telephone Encounter (Signed)
per pof to schpt appt-sent MW email to sch trmt-pt aware of appt

## 2015-05-25 ENCOUNTER — Ambulatory Visit (HOSPITAL_BASED_OUTPATIENT_CLINIC_OR_DEPARTMENT_OTHER): Payer: Medicare Other

## 2015-05-25 ENCOUNTER — Other Ambulatory Visit: Payer: Self-pay | Admitting: Internal Medicine

## 2015-05-25 VITALS — BP 105/65 | HR 99 | Temp 97.6°F | Resp 22

## 2015-05-25 DIAGNOSIS — C3492 Malignant neoplasm of unspecified part of left bronchus or lung: Secondary | ICD-10-CM | POA: Diagnosis not present

## 2015-05-25 DIAGNOSIS — Z5112 Encounter for antineoplastic immunotherapy: Secondary | ICD-10-CM | POA: Diagnosis present

## 2015-05-25 MED ORDER — SODIUM CHLORIDE 0.9 % IV SOLN
3.0000 mg/kg | Freq: Once | INTRAVENOUS | Status: AC
  Administered 2015-05-25: 200 mg via INTRAVENOUS
  Filled 2015-05-25: qty 20

## 2015-05-25 MED ORDER — SODIUM CHLORIDE 0.9 % IV SOLN
Freq: Once | INTRAVENOUS | Status: AC
  Administered 2015-05-25: 12:00:00 via INTRAVENOUS

## 2015-05-25 NOTE — Patient Instructions (Signed)
[facility] Discharge Instructions for Patients Receiving Chemotherapy  Today you received the following chemotherapy agents:  Nivolumab.  To help prevent nausea and vomiting after your treatment, we encourage you to take your nausea medication as directed.   If you develop nausea and vomiting that is not controlled by your nausea medication, call the clinic.   BELOW ARE SYMPTOMS THAT SHOULD BE REPORTED IMMEDIATELY:  *FEVER GREATER THAN 100.5 F  *CHILLS WITH OR WITHOUT FEVER  NAUSEA AND VOMITING THAT IS NOT CONTROLLED WITH YOUR NAUSEA MEDICATION  *UNUSUAL SHORTNESS OF BREATH  *UNUSUAL BRUISING OR BLEEDING  TENDERNESS IN MOUTH AND THROAT WITH OR WITHOUT PRESENCE OF ULCERS  *URINARY PROBLEMS  *BOWEL PROBLEMS  UNUSUAL RASH Items with * indicate a potential emergency and should be followed up as soon as possible.  Feel free to call the clinic you have any questions or concerns. The clinic phone number is [phone].  Please show the CHEMO ALERT CARD at check-in to the Emergency Department and triage nurse.   

## 2015-05-26 ENCOUNTER — Telehealth: Payer: Self-pay | Admitting: Neurology

## 2015-05-26 ENCOUNTER — Other Ambulatory Visit: Payer: Self-pay | Admitting: Neurology

## 2015-05-26 MED ORDER — LEVETIRACETAM 750 MG PO TABS: 1500.0000 mg | ORAL_TABLET | Freq: Two times a day (BID) | ORAL | Status: DC

## 2015-05-26 NOTE — Telephone Encounter (Signed)
Prescription sent thanks

## 2015-05-26 NOTE — Telephone Encounter (Signed)
Marco [name] requesting refill for levETIRAcetam (KEPPRA) 750 MG tablet . She states he was in ED 04/22/15 and was prescribed '750mg'$  ,    2 tabs 2x day and will he continue on this dose. She can be reached at [phone]

## 2015-05-26 NOTE — Telephone Encounter (Signed)
Called and spoke w/ [place] to advise that Rx has been sent. They verified they received it.

## 2015-05-27 NOTE — Patient Instructions (Signed)
Follow up in 2 weeks, prior to your next scheduled cycle of immuntherapy

## 2015-06-01 ENCOUNTER — Encounter: Payer: Self-pay | Admitting: *Deleted

## 2015-06-01 NOTE — Progress Notes (Signed)
Received physician referral status notification from [facility]: " The patient has been scheduled for the following date: 10/11/6-10/12/16".   Phone: [phone]

## 2015-06-08 ENCOUNTER — Telehealth: Payer: Self-pay | Admitting: *Deleted

## 2015-06-08 ENCOUNTER — Other Ambulatory Visit (HOSPITAL_BASED_OUTPATIENT_CLINIC_OR_DEPARTMENT_OTHER): Payer: Medicare Other

## 2015-06-08 ENCOUNTER — Encounter: Payer: Self-pay | Admitting: Internal Medicine

## 2015-06-08 ENCOUNTER — Telehealth: Payer: Self-pay | Admitting: Internal Medicine

## 2015-06-08 ENCOUNTER — Ambulatory Visit (HOSPITAL_BASED_OUTPATIENT_CLINIC_OR_DEPARTMENT_OTHER): Payer: Medicare Other | Admitting: Internal Medicine

## 2015-06-08 ENCOUNTER — Ambulatory Visit (HOSPITAL_BASED_OUTPATIENT_CLINIC_OR_DEPARTMENT_OTHER): Payer: Medicare Other

## 2015-06-08 VITALS — BP 103/84 | HR 118 | Temp 99.0°F | Resp 18 | Wt 137.8 lb

## 2015-06-08 DIAGNOSIS — R05 Cough: Secondary | ICD-10-CM | POA: Diagnosis not present

## 2015-06-08 DIAGNOSIS — Z5112 Encounter for antineoplastic immunotherapy: Secondary | ICD-10-CM | POA: Diagnosis present

## 2015-06-08 DIAGNOSIS — C3490 Malignant neoplasm of unspecified part of unspecified bronchus or lung: Secondary | ICD-10-CM

## 2015-06-08 DIAGNOSIS — Z79899 Other long term (current) drug therapy: Secondary | ICD-10-CM | POA: Diagnosis not present

## 2015-06-08 DIAGNOSIS — C3492 Malignant neoplasm of unspecified part of left bronchus or lung: Secondary | ICD-10-CM

## 2015-06-08 DIAGNOSIS — C3412 Malignant neoplasm of upper lobe, left bronchus or lung: Secondary | ICD-10-CM | POA: Diagnosis present

## 2015-06-08 LAB — CBC WITH DIFFERENTIAL/PLATELET
BASO%: 0.9 % (ref 0.0–2.0)
BASOS ABS: 0.1 10*3/uL (ref 0.0–0.1)
EOS ABS: 0.1 10*3/uL (ref 0.0–0.5)
EOS%: 0.8 % (ref 0.0–7.0)
HEMATOCRIT: 46.4 % (ref 38.4–49.9)
HEMOGLOBIN: 15.2 g/dL (ref 13.0–17.1)
LYMPH#: 1.5 10*3/uL (ref 0.9–3.3)
LYMPH%: 18.1 % (ref 14.0–49.0)
MCH: 28.2 pg (ref 27.2–33.4)
MCHC: 32.8 g/dL (ref 32.0–36.0)
MCV: 86 fL (ref 79.3–98.0)
MONO#: 0.8 10*3/uL (ref 0.1–0.9)
MONO%: 9.5 % (ref 0.0–14.0)
NEUT#: 6 10*3/uL (ref 1.5–6.5)
NEUT%: 70.7 % (ref 39.0–75.0)
Platelets: 181 10*3/uL (ref 140–400)
RBC: 5.39 10*6/uL (ref 4.20–5.82)
RDW: 15.7 % — AB (ref 11.0–14.6)
WBC: 8.5 10*3/uL (ref 4.0–10.3)

## 2015-06-08 LAB — COMPREHENSIVE METABOLIC PANEL (CC13)
ALBUMIN: 4 g/dL (ref 3.5–5.0)
ALK PHOS: 79 U/L (ref 40–150)
ALT: 9 U/L (ref 0–55)
ANION GAP: 9 meq/L (ref 3–11)
AST: 10 U/L (ref 5–34)
BILIRUBIN TOTAL: 0.59 mg/dL (ref 0.20–1.20)
BUN: 16.6 mg/dL (ref 7.0–26.0)
CO2: 29 mEq/L (ref 22–29)
CREATININE: 1.2 mg/dL (ref 0.7–1.3)
Calcium: 9.6 mg/dL (ref 8.4–10.4)
Chloride: 106 mEq/L (ref 98–109)
EGFR: 70 mL/min/{1.73_m2} — AB (ref >90)
GLUCOSE: 100 mg/dL (ref 70–140)
Potassium: 4 mEq/L (ref 3.5–5.1)
Sodium: 144 mEq/L (ref 136–145)
TOTAL PROTEIN: 7.5 g/dL (ref 6.4–8.3)

## 2015-06-08 LAB — TSH CHCC: TSH: 0.285 m(IU)/L — ABNORMAL LOW (ref 0.320–4.118)

## 2015-06-08 MED ORDER — SODIUM CHLORIDE 0.9 % IV SOLN
Freq: Once | INTRAVENOUS | Status: AC
  Administered 2015-06-08: 09:00:00 via INTRAVENOUS

## 2015-06-08 MED ORDER — HYDROCODONE-HOMATROPINE 5-1.5 MG/5ML PO SYRP: 5.0000 mL | ORAL_SOLUTION | Freq: Four times a day (QID) | ORAL | Status: DC | PRN

## 2015-06-08 MED ORDER — NIVOLUMAB CHEMO INJECTION 100 MG/10ML
3.0000 mg/kg | Freq: Once | INTRAVENOUS | Status: AC
  Administered 2015-06-08: 200 mg via INTRAVENOUS
  Filled 2015-06-08: qty 20

## 2015-06-08 NOTE — Telephone Encounter (Signed)
Pt confirmed labs/ov per 10/04 POF, gave pt AVS and Calendar.... KJ, sent msg to add chemo

## 2015-06-08 NOTE — Telephone Encounter (Signed)
Called and spoke to [place] to r/s appt for pt. Rescheduled to 10/14 at 830am for check in at 815am due to Dr. Jaynee Eagles not if office because of family emergency. He verbalized understanding.

## 2015-06-08 NOTE — Telephone Encounter (Signed)
Per staff message and POF I have scheduled appts. Advised scheduler of appts and to move labs. JMW  

## 2015-06-08 NOTE — Progress Notes (Signed)
[place] [phone]   [phone]  OFFICE PROGRESS NOTE  [name],[name], MD [address] [place] [address]  DIAGNOSIS: Stage IIA (T2a., N1, M0) non-small cell lung cancer, squamous cell carcinoma presented with central left upper lobe mass as well as hilar lymphadenopathy diagnosed in January of 2015.  PRIOR THERAPY:  1. Concurrent chemoradiation with weekly carboplatin for AUC of 2 and paclitaxel 45 mg/M2, status post 7 cycles. Last cycle was given 12/22/2013  2. Systemic chemotherapy with carboplatin for AUC of 5 and paclitaxel 175 MG/M2 with Neulasta support every 3 weeks. Status post 3 cycles  CURRENT THERAPY: Immunotherapy with Nivolumab 3 mg/kg given every 2 weeks. Status post 6 cycles.  INTERVAL HISTORY: Marco [name] 64 y.o. male returns to the clinic today for followup visit accompanied by his caregiver. The patient is currently undergoing treatment with immunotherapy with Nivolumab status post 6 cycles and tolerating his treatment fairly well. He recently started having some dry cough especially when he lays down. He denied having any nausea or vomiting, no fever or chills. He denied having any chest pain, shortness of breath, or hemoptysis. He denied having any significant weight loss or night sweats. He is tolerating his current treatment with immunotherapy fairly well and he is here today to start cycle #7.  MEDICAL HISTORY: Past Medical History  Diagnosis Date  . Mental disorder     sczizophrenia;moderate retardation  . Depression   . DEMENTIA   . Hypertension     no medications, no documented history per caregiver at preadmission  . GERD (gastroesophageal reflux disease)   . Seizures   . Developmental disability     developmentaly delayed  . Hx of radiation therapy 11/07/13-12/24/13    lung,60Gy/51f  . Mental retardation   . Seizure disorder   . Lung cancer   . Encounter for antineoplastic  immunotherapy 04/05/2015    ALLERGIES:  has No Known Allergies.  MEDICATIONS:  Current Outpatient Prescriptions  Medication Sig Dispense Refill  . bisacodyl (DULCOLAX) 5 MG EC tablet Take 5 mg by mouth daily as needed for moderate constipation (constipation).     .Marland Kitchendonepezil (ARICEPT) 10 MG tablet Take 10 mg by mouth at bedtime.     . fluticasone (FLONASE) 50 MCG/ACT nasal spray Place 2 sprays into both nostrils daily.    .Marland KitchenlevETIRAcetam (KEPPRA) 750 MG tablet Take 2 tablets (1,500 mg total) by mouth 2 (two) times daily. 120 tablet 11  . LORazepam (ATIVAN) 1 MG tablet Take 2 mg by mouth at bedtime.    .Marland Kitchenomeprazole (PRILOSEC) 20 MG capsule Take 20 mg by mouth 2 (two) times daily.    . risperiDONE (RISPERDAL) 3 MG tablet Take 3 mg by mouth at bedtime.    . traZODone (DESYREL) 50 MG tablet Take 25 mg by mouth at bedtime.      No current facility-administered medications for this visit.    SURGICAL HISTORY:  Past Surgical History  Procedure Laterality Date  . Cataract extraction w/phaco  07/12/2011    Procedure: CATARACT EXTRACTION PHACO AND INTRAOCULAR LENS PLACEMENT (IOC);  Surgeon: GAdonis Brook  [facility];  Service: Ophthalmology;  Laterality: Left;  .Marland KitchenEye surgery      L eye  . Video bronchoscopy Bilateral 10/01/2013    Procedure: VIDEO BRONCHOSCOPY WITHOUT FLUORO;  Surgeon: RCollene Gobble MD;  [facility]  Service: Cardiopulmonary;  Laterality: Bilateral;    REVIEW OF SYSTEMS:  A comprehensive review of systems was negative except for: Respiratory: positive for cough   PHYSICAL EXAMINATION: General appearance: alert, cooperative and no distress Head: Normocephalic, without obvious abnormality, atraumatic Neck: no adenopathy, no JVD, supple, symmetrical, trachea midline and thyroid not enlarged, symmetric, no tenderness/mass/nodules Lymph nodes: Cervical, supraclavicular, and axillary nodes normal. Resp: clear to auscultation bilaterally Back: symmetric, no  curvature. ROM normal. No CVA tenderness. Cardio: regular rate and rhythm, S1, S2 normal, no murmur, click, rub or gallop GI: soft, non-tender; bowel sounds normal; no masses,  no organomegaly Extremities: extremities normal, atraumatic, no cyanosis or edema Neurologic: Alert and oriented X 3, normal strength and tone. Normal symmetric reflexes. Normal coordination and gait  ECOG PERFORMANCE STATUS: 1 - Symptomatic but completely ambulatory  There were no vitals taken for this visit.  LABORATORY DATA: Lab Results  Component Value Date   WBC 8.5 06/08/2015   HGB 15.2 06/08/2015   HCT 46.4 06/08/2015   MCV 86.0 06/08/2015   PLT 181 06/08/2015      Chemistry      Component Value Date/Time   NA 142 05/21/2015 0957   NA 140 04/21/2015 0227   K 4.1 05/21/2015 0957   K 3.5 04/21/2015 0227   CL 107 04/21/2015 0227   CO2 26 05/21/2015 0957   CO2 25 04/21/2015 0227   BUN 13.0 05/21/2015 0957   BUN 7 04/21/2015 0227   CREATININE 0.9 05/21/2015 0957   CREATININE 0.85 04/21/2015 0227      Component Value Date/Time   CALCIUM 9.1 05/21/2015 0957   CALCIUM 8.6* 04/21/2015 0227   ALKPHOS 79 05/21/2015 0957   ALKPHOS 62 04/21/2015 0227   AST 10 05/21/2015 0957   AST 16 04/21/2015 0227   ALT 7 05/21/2015 0957   ALT 14* 04/21/2015 0227   BILITOT 0.29 05/21/2015 0957   BILITOT 0.6 04/21/2015 0227       RADIOGRAPHIC STUDIES:  ASSESSMENT AND PLAN: This is a very pleasant 64 years old [place] American male with unresectable a stage IIA non-small cell lung cancer. He completed a course of concurrent chemoradiation with weekly carboplatin and paclitaxel is status post 7 cycles. Has been observation since his treatment in April 2015.  His restaging CT scan of the chest showed further enlargement of the left upper lobe suprahilar mass concerning for disease recurrence. The patient was treated with 3 cycles of systemic chemotherapy with carboplatin and paclitaxel but unfortunately has  evidence for disease progression. He was started on treatment with immunotherapy with Nivolumab status post 6 cycles and tolerating his treatment fairly well. We will proceed with cycle #7 today as a scheduled. For the cough, I started the patient on Hycodan 5 ML by mouth every 6 hours as needed. CODE STATUS was discussed with the patient and his caregiver and they were not able to make a decision today. He will come back for follow-up visit in 2 weeks for reevaluation before starting cycle #8. He was advised to call immediately if he has any concerning symptoms in the interval. The patient voices understanding of current disease status and treatment options and is in agreement with the current care plan.  All questions were answered. The patient knows to call the clinic with any problems, questions or concerns. We can certainly see the patient much sooner if necessary.  Disclaimer: This note was dictated with voice recognition software. Similar sounding words can inadvertently be transcribed and may not be corrected upon review.

## 2015-06-08 NOTE — Patient Instructions (Signed)
[facility] Discharge Instructions for Patients Receiving Chemotherapy  Today you received the following chemotherapy agents:  Nivolumab.  To help prevent nausea and vomiting after your treatment, we encourage you to take your nausea medication as directed.   If you develop nausea and vomiting that is not controlled by your nausea medication, call the clinic.   BELOW ARE SYMPTOMS THAT SHOULD BE REPORTED IMMEDIATELY:  *FEVER GREATER THAN 100.5 F  *CHILLS WITH OR WITHOUT FEVER  NAUSEA AND VOMITING THAT IS NOT CONTROLLED WITH YOUR NAUSEA MEDICATION  *UNUSUAL SHORTNESS OF BREATH  *UNUSUAL BRUISING OR BLEEDING  TENDERNESS IN MOUTH AND THROAT WITH OR WITHOUT PRESENCE OF ULCERS  *URINARY PROBLEMS  *BOWEL PROBLEMS  UNUSUAL RASH Items with * indicate a potential emergency and should be followed up as soon as possible.  Feel free to call the clinic you have any questions or concerns. The clinic phone number is [phone].  Please show the CHEMO ALERT CARD at check-in to the Emergency Department and triage nurse.   

## 2015-06-09 ENCOUNTER — Ambulatory Visit: Payer: Medicare Other | Admitting: Neurology

## 2015-06-16 ENCOUNTER — Telehealth: Payer: Self-pay | Admitting: Neurology

## 2015-06-16 ENCOUNTER — Encounter: Payer: Self-pay | Admitting: *Deleted

## 2015-06-16 NOTE — Progress Notes (Signed)
Received physician referral notification that: "The study has completed and has been loaded onto our server. We will send you both an additional notification within 48 hours to let you know it has been scanned and a report generated for Dr. Junius Argyle to review and interpret".  Phone: [phone]

## 2015-06-16 NOTE — Telephone Encounter (Signed)
Marco [name], I just saw patient on 9/15 for f/u after hospitalization. I think the appt thisFriday was scheduled in the past and I may not need to see him so soon. Would you call and ask if they really need to come in on Friday? If not, we can cancel. thanks

## 2015-06-17 NOTE — Telephone Encounter (Signed)
LVM for Marco [name] to call back regarding his appt for tomorrow. Gave [facility] phone number.

## 2015-06-17 NOTE — Telephone Encounter (Signed)
Called and spoke to caregiver. Advised that he does not need to come for appt tomorrow if pt is doing well. This was original f/u made before he was seen last month. States he has no new symptoms/worsening symptoms at this time. Told him to call the office if anything changes and we will be happy to make an appointment. He verbalized understanding. I will cancel appt for tomorrow.

## 2015-06-18 ENCOUNTER — Ambulatory Visit: Payer: Medicare Other | Admitting: Neurology

## 2015-06-21 ENCOUNTER — Encounter: Payer: Self-pay | Admitting: *Deleted

## 2015-06-21 NOTE — Progress Notes (Signed)
Received physician referral notification from [facility]: " The AMB EEG has been scanned and a technical report has been created by our scanning department. Dr Junius Argyle please log onto our server to review and complete your interpretation. Dr. Junius Argyle, when you have completed your interpretation, please email me at [email or web address]  and we will forward the report to Dr. Jaynee Eagles".  Dated 06/17/15

## 2015-06-22 ENCOUNTER — Other Ambulatory Visit (HOSPITAL_BASED_OUTPATIENT_CLINIC_OR_DEPARTMENT_OTHER): Payer: Medicare Other

## 2015-06-22 ENCOUNTER — Telehealth: Payer: Self-pay | Admitting: Neurology

## 2015-06-22 ENCOUNTER — Ambulatory Visit (HOSPITAL_BASED_OUTPATIENT_CLINIC_OR_DEPARTMENT_OTHER): Payer: Medicare Other

## 2015-06-22 VITALS — BP 114/76 | HR 97 | Temp 98.9°F | Resp 20

## 2015-06-22 DIAGNOSIS — Z5112 Encounter for antineoplastic immunotherapy: Secondary | ICD-10-CM

## 2015-06-22 DIAGNOSIS — C3412 Malignant neoplasm of upper lobe, left bronchus or lung: Secondary | ICD-10-CM | POA: Diagnosis present

## 2015-06-22 DIAGNOSIS — C3492 Malignant neoplasm of unspecified part of left bronchus or lung: Secondary | ICD-10-CM

## 2015-06-22 LAB — CBC WITH DIFFERENTIAL/PLATELET
BASO%: 0.9 % (ref 0.0–2.0)
Basophils Absolute: 0.1 10*3/uL (ref 0.0–0.1)
EOS ABS: 0.1 10*3/uL (ref 0.0–0.5)
EOS%: 1.1 % (ref 0.0–7.0)
HCT: 42.8 % (ref 38.4–49.9)
HEMOGLOBIN: 13.8 g/dL (ref 13.0–17.1)
LYMPH%: 16.1 % (ref 14.0–49.0)
MCH: 27.9 pg (ref 27.2–33.4)
MCHC: 32.3 g/dL (ref 32.0–36.0)
MCV: 86.3 fL (ref 79.3–98.0)
MONO#: 0.6 10*3/uL (ref 0.1–0.9)
MONO%: 9 % (ref 0.0–14.0)
NEUT%: 72.9 % (ref 39.0–75.0)
NEUTROS ABS: 4.8 10*3/uL (ref 1.5–6.5)
PLATELETS: 185 10*3/uL (ref 140–400)
RBC: 4.96 10*6/uL (ref 4.20–5.82)
RDW: 16.1 % — AB (ref 11.0–14.6)
WBC: 6.6 10*3/uL (ref 4.0–10.3)
lymph#: 1.1 10*3/uL (ref 0.9–3.3)

## 2015-06-22 LAB — COMPREHENSIVE METABOLIC PANEL (CC13)
ANION GAP: 7 meq/L (ref 3–11)
AST: 8 U/L (ref 5–34)
Albumin: 3.7 g/dL (ref 3.5–5.0)
Alkaline Phosphatase: 75 U/L (ref 40–150)
BILIRUBIN TOTAL: 0.33 mg/dL (ref 0.20–1.20)
BUN: 11.4 mg/dL (ref 7.0–26.0)
CO2: 28 meq/L (ref 22–29)
CREATININE: 1 mg/dL (ref 0.7–1.3)
Calcium: 9.1 mg/dL (ref 8.4–10.4)
Chloride: 108 mEq/L (ref 98–109)
EGFR: 86 mL/min/{1.73_m2} — ABNORMAL LOW (ref >90)
GLUCOSE: 101 mg/dL (ref 70–140)
Potassium: 3.6 mEq/L (ref 3.5–5.1)
SODIUM: 143 meq/L (ref 136–145)
TOTAL PROTEIN: 7.1 g/dL (ref 6.4–8.3)

## 2015-06-22 MED ORDER — SODIUM CHLORIDE 0.9 % IV SOLN
Freq: Once | INTRAVENOUS | Status: AC
  Administered 2015-06-22: 11:00:00 via INTRAVENOUS

## 2015-06-22 MED ORDER — SODIUM CHLORIDE 0.9 % IV SOLN
3.0000 mg/kg | Freq: Once | INTRAVENOUS | Status: DC
  Filled 2015-06-22: qty 20

## 2015-06-22 MED ORDER — SODIUM CHLORIDE 0.9 % IV SOLN
240.0000 mg | Freq: Once | INTRAVENOUS | Status: AC
  Administered 2015-06-22: 240 mg via INTRAVENOUS
  Filled 2015-06-22: qty 24

## 2015-06-22 NOTE — Patient Instructions (Signed)
[facility] Discharge Instructions for Patients Receiving Chemotherapy  Today you received the following chemotherapy agents:  Nivolumab.  To help prevent nausea and vomiting after your treatment, we encourage you to take your nausea medication as directed.   If you develop nausea and vomiting that is not controlled by your nausea medication, call the clinic.   BELOW ARE SYMPTOMS THAT SHOULD BE REPORTED IMMEDIATELY:  *FEVER GREATER THAN 100.5 F  *CHILLS WITH OR WITHOUT FEVER  NAUSEA AND VOMITING THAT IS NOT CONTROLLED WITH YOUR NAUSEA MEDICATION  *UNUSUAL SHORTNESS OF BREATH  *UNUSUAL BRUISING OR BLEEDING  TENDERNESS IN MOUTH AND THROAT WITH OR WITHOUT PRESENCE OF ULCERS  *URINARY PROBLEMS  *BOWEL PROBLEMS  UNUSUAL RASH Items with * indicate a potential emergency and should be followed up as soon as possible.  Feel free to call the clinic you have any questions or concerns. The clinic phone number is [phone].  Please show the CHEMO ALERT CARD at check-in to the Emergency Department and triage nurse.   

## 2015-06-22 NOTE — Telephone Encounter (Signed)
Marco [name], please let patient's brother know his long-term video eeg was entirely normal. There was no epileptic activity. This is great news, he appears to be well controlled. The events they designated were not seizures. thanks

## 2015-06-23 NOTE — Telephone Encounter (Signed)
Called son, [name] to inform him that he is not listed on DPR form and I cannot give any health information to him regarding his father because of this. He can stop by the office and pick up another form if needed and have Shae update this if he wants. He verbalized understanding. Marco [name] is his sister and currently still the pt caregiver.

## 2015-06-23 NOTE — Telephone Encounter (Signed)
I advised the patient's sister Bonne Dolores that the patient's EEG was normal and no seizure activity.

## 2015-06-23 NOTE — Telephone Encounter (Signed)
LVM for pt caregiver. Asked her to call back to verify she is still pt caregiver. I was calling about EEG results. EEG was normal per Dr. Jaynee Eagles. No seizure activity seen. He is well-controlled. The events they designated were not seizures. Gave [facility] phone number and office hours. Okay to let her know.   Marco [name] (pt son) needs to be added to pt DPR form for me to be able to disclose any type of medical information.

## 2015-06-27 ENCOUNTER — Inpatient Hospital Stay (HOSPITAL_COMMUNITY)
Admission: EM | Admit: 2015-06-27 | Discharge: 2015-07-05 | DRG: 100 | Disposition: A | Discharge status: Home Health | Payer: Medicare Other | Attending: Internal Medicine | Admitting: Internal Medicine

## 2015-06-27 ENCOUNTER — Emergency Department (HOSPITAL_COMMUNITY): Payer: Medicare Other

## 2015-06-27 ENCOUNTER — Encounter (HOSPITAL_COMMUNITY): Payer: Self-pay | Admitting: Emergency Medicine

## 2015-06-27 DIAGNOSIS — E876 Hypokalemia: Secondary | ICD-10-CM | POA: Diagnosis not present

## 2015-06-27 DIAGNOSIS — I10 Essential (primary) hypertension: Secondary | ICD-10-CM | POA: Diagnosis present

## 2015-06-27 DIAGNOSIS — G40901 Epilepsy, unspecified, not intractable, with status epilepticus: Secondary | ICD-10-CM | POA: Diagnosis present

## 2015-06-27 DIAGNOSIS — F0390 Unspecified dementia without behavioral disturbance: Secondary | ICD-10-CM | POA: Diagnosis present

## 2015-06-27 DIAGNOSIS — Z681 Body mass index (BMI) 19 or less, adult: Secondary | ICD-10-CM | POA: Diagnosis not present

## 2015-06-27 DIAGNOSIS — C3412 Malignant neoplasm of upper lobe, left bronchus or lung: Secondary | ICD-10-CM | POA: Diagnosis present

## 2015-06-27 DIAGNOSIS — J9601 Acute respiratory failure with hypoxia: Secondary | ICD-10-CM | POA: Diagnosis present

## 2015-06-27 DIAGNOSIS — Z87891 Personal history of nicotine dependence: Secondary | ICD-10-CM

## 2015-06-27 DIAGNOSIS — I959 Hypotension, unspecified: Secondary | ICD-10-CM | POA: Diagnosis not present

## 2015-06-27 DIAGNOSIS — F209 Schizophrenia, unspecified: Secondary | ICD-10-CM | POA: Diagnosis present

## 2015-06-27 DIAGNOSIS — R569 Unspecified convulsions: Secondary | ICD-10-CM

## 2015-06-27 DIAGNOSIS — E440 Moderate protein-calorie malnutrition: Secondary | ICD-10-CM | POA: Diagnosis not present

## 2015-06-27 DIAGNOSIS — F329 Major depressive disorder, single episode, unspecified: Secondary | ICD-10-CM | POA: Diagnosis not present

## 2015-06-27 DIAGNOSIS — F79 Unspecified intellectual disabilities: Secondary | ICD-10-CM | POA: Diagnosis not present

## 2015-06-27 DIAGNOSIS — E162 Hypoglycemia, unspecified: Secondary | ICD-10-CM | POA: Diagnosis not present

## 2015-06-27 DIAGNOSIS — F72 Severe intellectual disabilities: Secondary | ICD-10-CM | POA: Diagnosis not present

## 2015-06-27 DIAGNOSIS — E870 Hyperosmolality and hypernatremia: Secondary | ICD-10-CM | POA: Diagnosis not present

## 2015-06-27 DIAGNOSIS — K219 Gastro-esophageal reflux disease without esophagitis: Secondary | ICD-10-CM | POA: Diagnosis present

## 2015-06-27 DIAGNOSIS — G9340 Encephalopathy, unspecified: Secondary | ICD-10-CM | POA: Diagnosis present

## 2015-06-27 DIAGNOSIS — G40909 Epilepsy, unspecified, not intractable, without status epilepticus: Secondary | ICD-10-CM | POA: Diagnosis present

## 2015-06-27 DIAGNOSIS — A419 Sepsis, unspecified organism: Secondary | ICD-10-CM | POA: Diagnosis not present

## 2015-06-27 DIAGNOSIS — Z8249 Family history of ischemic heart disease and other diseases of the circulatory system: Secondary | ICD-10-CM

## 2015-06-27 DIAGNOSIS — E872 Acidosis: Secondary | ICD-10-CM | POA: Diagnosis not present

## 2015-06-27 DIAGNOSIS — J9690 Respiratory failure, unspecified, unspecified whether with hypoxia or hypercapnia: Secondary | ICD-10-CM | POA: Diagnosis present

## 2015-06-27 DIAGNOSIS — N179 Acute kidney failure, unspecified: Secondary | ICD-10-CM | POA: Diagnosis present

## 2015-06-27 DIAGNOSIS — Z9221 Personal history of antineoplastic chemotherapy: Secondary | ICD-10-CM

## 2015-06-27 DIAGNOSIS — E860 Dehydration: Secondary | ICD-10-CM | POA: Diagnosis present

## 2015-06-27 DIAGNOSIS — Z923 Personal history of irradiation: Secondary | ICD-10-CM | POA: Diagnosis not present

## 2015-06-27 DIAGNOSIS — J9600 Acute respiratory failure, unspecified whether with hypoxia or hypercapnia: Secondary | ICD-10-CM

## 2015-06-27 DIAGNOSIS — M6282 Rhabdomyolysis: Secondary | ICD-10-CM | POA: Diagnosis not present

## 2015-06-27 DIAGNOSIS — D649 Anemia, unspecified: Secondary | ICD-10-CM | POA: Diagnosis not present

## 2015-06-27 DIAGNOSIS — C3492 Malignant neoplasm of unspecified part of left bronchus or lung: Secondary | ICD-10-CM | POA: Diagnosis present

## 2015-06-27 DIAGNOSIS — N390 Urinary tract infection, site not specified: Secondary | ICD-10-CM | POA: Diagnosis not present

## 2015-06-27 DIAGNOSIS — Z79899 Other long term (current) drug therapy: Secondary | ICD-10-CM | POA: Diagnosis not present

## 2015-06-27 DIAGNOSIS — Z9289 Personal history of other medical treatment: Secondary | ICD-10-CM

## 2015-06-27 DIAGNOSIS — D72829 Elevated white blood cell count, unspecified: Secondary | ICD-10-CM | POA: Diagnosis present

## 2015-06-27 LAB — BLOOD GAS, ARTERIAL
ACID-BASE DEFICIT: 1.8 mmol/L (ref 0.0–2.0)
Acid-Base Excess: 0.5 mmol/L (ref 0.0–2.0)
Bicarbonate: 21.1 mEq/L (ref 20.0–24.0)
Bicarbonate: 20.5 mEq/L (ref 20.0–24.0)
DRAWN BY: 307971
DRAWN BY: 235321
FIO2: 1
FIO2: 0.3
MECHVT: 620 mL
O2 SAT: 99.8 %
O2 SAT: 99 %
PCO2 ART: 32.2 mmHg — AB (ref 35.0–45.0)
PCO2 ART: 22.7 mmHg — AB (ref 35.0–45.0)
PEEP/CPAP: 5 cmH2O
PEEP: 5 cmH2O
PH ART: 7.433 (ref 7.350–7.450)
PH ART: 7.567 — AB (ref 7.350–7.450)
PO2 ART: 135 mmHg — AB (ref 80.0–100.0)
Patient temperature: 98.6
Patient temperature: 100.5
RATE: 20 resp/min
RATE: 20 resp/min
TCO2: 18.6 mmol/L (ref 0–100)
TCO2: 17.5 mmol/L (ref 0–100)
VT: 550 mL
pO2, Arterial: 568 mmHg — ABNORMAL HIGH (ref 80.0–100.0)

## 2015-06-27 LAB — CBC WITH DIFFERENTIAL/PLATELET
Basophils Absolute: 0.1 10*3/uL (ref 0.0–0.1)
Basophils Relative: 1 %
Eosinophils Absolute: 0 10*3/uL (ref 0.0–0.7)
Eosinophils Relative: 0 %
HEMATOCRIT: 44.2 % (ref 39.0–52.0)
Hemoglobin: 13.9 g/dL (ref 13.0–17.0)
LYMPHS PCT: 22 %
Lymphs Abs: 2.5 10*3/uL (ref 0.7–4.0)
MCH: 28.5 pg (ref 26.0–34.0)
MCHC: 31.4 g/dL (ref 30.0–36.0)
MCV: 90.6 fL (ref 78.0–100.0)
MONO ABS: 0.7 10*3/uL (ref 0.1–1.0)
MONOS PCT: 6 %
NEUTROS ABS: 8 10*3/uL — AB (ref 1.7–7.7)
Neutrophils Relative %: 71 %
Platelets: 226 10*3/uL (ref 150–400)
RBC: 4.88 MIL/uL (ref 4.22–5.81)
RDW: 14.8 % (ref 11.5–15.5)
WBC: 11.3 10*3/uL — ABNORMAL HIGH (ref 4.0–10.5)

## 2015-06-27 LAB — URINALYSIS, ROUTINE W REFLEX MICROSCOPIC
Bilirubin Urine: NEGATIVE
GLUCOSE, UA: NEGATIVE mg/dL
Ketones, ur: NEGATIVE mg/dL
Leukocytes, UA: NEGATIVE
Nitrite: NEGATIVE
PH: 5 (ref 5.0–8.0)
Protein, ur: 100 mg/dL — AB
SPECIFIC GRAVITY, URINE: 1.02 (ref 1.005–1.030)
UROBILINOGEN UA: 0.2 mg/dL (ref 0.0–1.0)

## 2015-06-27 LAB — RAPID URINE DRUG SCREEN, HOSP PERFORMED
Amphetamines: NOT DETECTED
BARBITURATES: NOT DETECTED
BENZODIAZEPINES: POSITIVE — AB
COCAINE: NOT DETECTED
Opiates: NOT DETECTED
TETRAHYDROCANNABINOL: NOT DETECTED

## 2015-06-27 LAB — LACTIC ACID, PLASMA: Lactic Acid, Venous: 6.7 mmol/L (ref 0.5–2.0)

## 2015-06-27 LAB — BASIC METABOLIC PANEL
Anion gap: 17 — ABNORMAL HIGH (ref 5–15)
BUN: 15 mg/dL (ref 6–20)
CALCIUM: 9 mg/dL (ref 8.9–10.3)
CO2: 16 mmol/L — AB (ref 22–32)
CREATININE: 1.38 mg/dL — AB (ref 0.61–1.24)
Chloride: 107 mmol/L (ref 101–111)
GFR calc Af Amer: 60 mL/min — ABNORMAL LOW (ref >60)
GFR calc non Af Amer: 52 mL/min — ABNORMAL LOW (ref >60)
GLUCOSE: 188 mg/dL — AB (ref 65–99)
Potassium: 4.4 mmol/L (ref 3.5–5.1)
Sodium: 140 mmol/L (ref 135–145)

## 2015-06-27 LAB — HEPATIC FUNCTION PANEL
ALT: 10 U/L — ABNORMAL LOW (ref 17–63)
AST: 31 U/L (ref 15–41)
Albumin: 4 g/dL (ref 3.5–5.0)
Alkaline Phosphatase: 68 U/L (ref 38–126)
BILIRUBIN DIRECT: 0.2 mg/dL (ref 0.1–0.5)
BILIRUBIN INDIRECT: 0.4 mg/dL (ref 0.3–0.9)
Total Bilirubin: 0.6 mg/dL (ref 0.3–1.2)
Total Protein: 7.4 g/dL (ref 6.5–8.1)

## 2015-06-27 LAB — URINE MICROSCOPIC-ADD ON

## 2015-06-27 LAB — TROPONIN I: Troponin I: <0.03 ng/mL (ref <0.031)

## 2015-06-27 LAB — PROTIME-INR
INR: 1.25 (ref 0.00–1.49)
Prothrombin Time: 15.8 seconds — ABNORMAL HIGH (ref 11.6–15.2)

## 2015-06-27 LAB — MRSA PCR SCREENING: MRSA by PCR: NEGATIVE

## 2015-06-27 LAB — CK: CK TOTAL: 221 U/L (ref 49–397)

## 2015-06-27 LAB — I-STAT CG4 LACTIC ACID, ED: Lactic Acid, Venous: 15.36 mmol/L (ref 0.5–2.0)

## 2015-06-27 MED ORDER — ANTISEPTIC ORAL RINSE SOLUTION (CORINZ)
7.0000 mL | Freq: Four times a day (QID) | OROMUCOSAL | Status: DC
Start: 2015-06-28 — End: 2015-07-03
  Administered 2015-06-28 – 2015-07-03 (×24): 7 mL via OROMUCOSAL

## 2015-06-27 MED ORDER — SODIUM CHLORIDE 0.9 % IV SOLN
1500.0000 mg | Freq: Two times a day (BID) | INTRAVENOUS | Status: DC
  Administered 2015-06-28 – 2015-07-03 (×11): 1500 mg via INTRAVENOUS
  Filled 2015-06-27 (×12): qty 15

## 2015-06-27 MED ORDER — PROPOFOL 1000 MG/100ML IV EMUL
INTRAVENOUS | Status: AC
  Administered 2015-06-27: 1000 mg via INTRAVENOUS
  Filled 2015-06-27: qty 100

## 2015-06-27 MED ORDER — MIDAZOLAM HCL 2 MG/2ML IJ SOLN
1.0000 mg | Freq: Once | INTRAMUSCULAR | Status: AC
  Administered 2015-06-27: 2.5 mg via INTRAVENOUS
  Administered 2015-06-27: 1 mg via INTRAVENOUS

## 2015-06-27 MED ORDER — FAMOTIDINE IN NACL 20-0.9 MG/50ML-% IV SOLN
20.0000 mg | Freq: Two times a day (BID) | INTRAVENOUS | Status: DC
  Administered 2015-06-27 – 2015-07-03 (×12): 20 mg via INTRAVENOUS
  Filled 2015-06-27 (×12): qty 50

## 2015-06-27 MED ORDER — FENTANYL CITRATE (PF) 100 MCG/2ML IJ SOLN: 50.0000 ug | Freq: Once | INTRAMUSCULAR | Status: DC

## 2015-06-27 MED ORDER — FENTANYL BOLUS VIA INFUSION
25.0000 ug | INTRAVENOUS | Status: DC | PRN
  Administered 2015-06-28: 25 ug via INTRAVENOUS
  Filled 2015-06-27 (×3): qty 25

## 2015-06-27 MED ORDER — FENTANYL CITRATE (PF) 100 MCG/2ML IJ SOLN
25.0000 ug | Freq: Once | INTRAMUSCULAR | Status: AC
  Administered 2015-06-27: 25 ug via INTRAVENOUS
  Filled 2015-06-27: qty 2

## 2015-06-27 MED ORDER — SODIUM CHLORIDE 0.9 % IV SOLN
250.0000 mL | INTRAVENOUS | Status: DC | PRN
  Administered 2015-06-27 – 2015-06-28 (×2): 250 mL via INTRAVENOUS

## 2015-06-27 MED ORDER — VANCOMYCIN HCL IN DEXTROSE 1-5 GM/200ML-% IV SOLN
1000.0000 mg | Freq: Once | INTRAVENOUS | Status: AC
  Administered 2015-06-27: 1000 mg via INTRAVENOUS
  Filled 2015-06-27: qty 200

## 2015-06-27 MED ORDER — MIDAZOLAM HCL 2 MG/2ML IJ SOLN
INTRAMUSCULAR | Status: AC
  Administered 2015-06-27: 2.5 mg via INTRAVENOUS
  Filled 2015-06-27: qty 2

## 2015-06-27 MED ORDER — MIDAZOLAM HCL 2 MG/2ML IJ SOLN
2.0000 mg | Freq: Once | INTRAMUSCULAR | Status: DC
Start: 2015-06-27 — End: 2015-06-29

## 2015-06-27 MED ORDER — SODIUM CHLORIDE 0.9 % IV BOLUS (SEPSIS)
1000.0000 mL | INTRAVENOUS | Status: AC
  Administered 2015-06-27: 1000 mL via INTRAVENOUS

## 2015-06-27 MED ORDER — MIDAZOLAM HCL 2 MG/2ML IJ SOLN
INTRAMUSCULAR | Status: AC
  Administered 2015-06-27: 2 mg
  Filled 2015-06-27: qty 2

## 2015-06-27 MED ORDER — SODIUM CHLORIDE 0.9 % IV BOLUS (SEPSIS)
1000.0000 mL | Freq: Once | INTRAVENOUS | Status: AC
Start: 2015-06-27 — End: 2015-06-27
  Administered 2015-06-27: 1000 mL via INTRAVENOUS

## 2015-06-27 MED ORDER — SODIUM CHLORIDE 0.9 % IV SOLN
25.0000 ug/h | INTRAVENOUS | Status: DC
  Administered 2015-06-27: 50 ug/h via INTRAVENOUS
  Administered 2015-06-28: 175 ug/h via INTRAVENOUS
  Administered 2015-06-29: 350 ug/h via INTRAVENOUS
  Administered 2015-06-29: 200 ug/h via INTRAVENOUS
  Filled 2015-06-27 (×5): qty 50

## 2015-06-27 MED ORDER — SODIUM CHLORIDE 0.9 % IV SOLN
1000.0000 mg | Freq: Once | INTRAVENOUS | Status: AC
  Administered 2015-06-27: 1000 mg via INTRAVENOUS
  Filled 2015-06-27: qty 10

## 2015-06-27 MED ORDER — FENTANYL CITRATE (PF) 100 MCG/2ML IJ SOLN
100.0000 ug | Freq: Once | INTRAMUSCULAR | Status: AC
  Administered 2015-06-27: 100 ug via INTRAVENOUS
  Filled 2015-06-27: qty 2

## 2015-06-27 MED ORDER — SENNOSIDES 8.8 MG/5ML PO SYRP
5.0000 mL | ORAL_SOLUTION | Freq: Two times a day (BID) | ORAL | Status: DC | PRN
  Filled 2015-06-27: qty 5

## 2015-06-27 MED ORDER — SODIUM CHLORIDE 0.9 % IV SOLN
500.0000 mg | Freq: Two times a day (BID) | INTRAVENOUS | Status: DC
  Administered 2015-06-28 – 2015-07-02 (×8): 500 mg via INTRAVENOUS
  Filled 2015-06-27 (×10): qty 500

## 2015-06-27 MED ORDER — PIPERACILLIN-TAZOBACTAM 3.375 G IVPB 30 MIN
3.3750 g | Freq: Once | INTRAVENOUS | Status: AC
  Administered 2015-06-27: 3.375 g via INTRAVENOUS
  Filled 2015-06-27: qty 50

## 2015-06-27 MED ORDER — PIPERACILLIN-TAZOBACTAM 3.375 G IVPB
3.3750 g | Freq: Three times a day (TID) | INTRAVENOUS | Status: DC
  Administered 2015-06-28 – 2015-07-02 (×13): 3.375 g via INTRAVENOUS
  Filled 2015-06-27 (×13): qty 50

## 2015-06-27 MED ORDER — ACETAMINOPHEN 650 MG RE SUPP
650.0000 mg | RECTAL | Status: DC | PRN
  Administered 2015-06-27: 650 mg via RECTAL
  Filled 2015-06-27 (×2): qty 1

## 2015-06-27 MED ORDER — CHLORHEXIDINE GLUCONATE 0.12% ORAL RINSE (MEDLINE KIT)
15.0000 mL | Freq: Two times a day (BID) | OROMUCOSAL | Status: DC
  Administered 2015-06-27 – 2015-07-05 (×14): 15 mL via OROMUCOSAL
  Filled 2015-06-27: qty 15

## 2015-06-27 MED ORDER — ENOXAPARIN SODIUM 40 MG/0.4ML ~~LOC~~ SOLN
40.0000 mg | SUBCUTANEOUS | Status: DC
  Administered 2015-06-27 – 2015-07-04 (×8): 40 mg via SUBCUTANEOUS
  Filled 2015-06-27 (×10): qty 0.4

## 2015-06-27 MED ORDER — MIDAZOLAM HCL 2 MG/2ML IJ SOLN
INTRAMUSCULAR | Status: AC
  Filled 2015-06-27: qty 2

## 2015-06-27 MED ORDER — PROPOFOL 1000 MG/100ML IV EMUL
5.0000 ug/kg/min | INTRAVENOUS | Status: DC
  Administered 2015-06-27: 13.333 ug/kg/min via INTRAVENOUS
  Administered 2015-06-27: 40 ug/kg/min via INTRAVENOUS
  Administered 2015-06-27: 1000 mg via INTRAVENOUS
  Administered 2015-06-28 (×2): 40 ug/kg/min via INTRAVENOUS
  Administered 2015-06-28: 20 ug/kg/min via INTRAVENOUS
  Filled 2015-06-27 (×4): qty 100

## 2015-06-27 MED ORDER — SODIUM CHLORIDE 0.9 % IV SOLN
100.0000 mg | Freq: Two times a day (BID) | INTRAVENOUS | Status: DC
  Administered 2015-06-27 – 2015-07-03 (×12): 100 mg via INTRAVENOUS
  Filled 2015-06-27 (×13): qty 10

## 2015-06-27 MED ORDER — MIDAZOLAM HCL 2 MG/2ML IJ SOLN
2.0000 mg | Freq: Once | INTRAMUSCULAR | Status: AC
  Administered 2015-06-27: 2 mg via INTRAVENOUS

## 2015-06-27 MED ORDER — BISACODYL 10 MG RE SUPP: 10.0000 mg | Freq: Every day | RECTAL | Status: DC | PRN

## 2015-06-27 NOTE — ED Notes (Addendum)
Pt arrived via EMS with report of grand mal seizure like activity x3 min. Upon arrival of EMS pt was unresponsive and postical state. Pt had 2 more witnessed seizure lasting 80sec adn 40sec. Pt was sx x1 on scene and c-collar in place. Pt was given Versed 2.'5mg'$  IV and arrived on NRB at 10L/min. Pt with lt side gaze. Pt moved to Res A room immediately.

## 2015-06-27 NOTE — ED Notes (Signed)
Hospitalist at bedside 

## 2015-06-27 NOTE — ED Notes (Signed)
Bed: [facility]29 Expected date: 06/27/15 Expected time: 3:16 PM Means of arrival: Ambulance Comments: seizure

## 2015-06-27 NOTE — Progress Notes (Signed)
[name] DOB: 26-Jan-1951 MRN: [id number]   Date of Service  06/27/2015  HPI/Events of Note  ABG 7.56/22/135 on PRVC 20/620  eICU Interventions  WIll cut down TV to 458m. Repeat ABG in am.      Intervention Category Major Interventions: Acid-Base disturbance - evaluation and management  PDimas Chyle10/23/2016, 10:33 PM

## 2015-06-27 NOTE — ED Notes (Signed)
Protable X-ray at bedside.

## 2015-06-27 NOTE — Progress Notes (Signed)
[facility] Progress Note Patient Name: TAICHI REPKA DOB: [birth year] MRN: [id number]   Date of Service  06/27/2015  HPI/Events of Note  T- 102, WBC 11.3  eICU Interventions  Will start broad spectrum antibiotics. Serial LA     Intervention Category Major Interventions: Sepsis - evaluation and management  Dimas Chyle 06/27/2015, 10:58 PM

## 2015-06-27 NOTE — ED Notes (Signed)
Lactic acid 15.36, EDP [place] notified

## 2015-06-27 NOTE — Progress Notes (Signed)
ANTIBIOTIC CONSULT NOTE - INITIAL  Pharmacy Consult for Zosyn/Vancomcyin Indication: Sepsis  No Known Allergies  Patient Measurements: Height: 6' (182.9 cm) Weight: 143 lb 1.3 oz (64.9 kg) IBW/kg (Calculated) : 77.6   Vital Signs: Temp: 102.2 F (39 C) (10/23 2300) Temp Source: Oral (10/23 2130) BP: 81/40 mmHg (10/23 2300) Pulse Rate: 107 (10/23 2300) Intake/Output from previous day:   Intake/Output from this shift: Total I/O In: 480 [I.V.:80; IV Piggyback:400] Out: 240 [Urine:240]  Labs:  Recent Labs  06/27/15 1533  WBC 11.3*  HGB 13.9  PLT 226  CREATININE 1.38*   Estimated Creatinine Clearance: 48.3 mL/min (by C-G formula based on Cr of 1.38). No results for input(s): VANCOTROUGH, VANCOPEAK, VANCORANDOM, GENTTROUGH, GENTPEAK, GENTRANDOM, TOBRATROUGH, TOBRAPEAK, TOBRARND, AMIKACINPEAK, AMIKACINTROU, AMIKACIN in the last 72 hours.   Microbiology: Recent Results (from the past 720 hour(s))  MRSA PCR Screening     Status: None   Collection Time: 06/27/15  7:00 PM  Result Value Ref Range Status   MRSA by PCR NEGATIVE NEGATIVE Final    Comment:        The GeneXpert MRSA Assay (FDA approved for NASAL specimens only), is one component of a comprehensive MRSA colonization surveillance program. It is not intended to diagnose MRSA infection nor to guide or monitor treatment for MRSA infections.     Medical History: Past Medical History  Diagnosis Date  . Mental disorder     sczizophrenia;moderate retardation  . Depression   . DEMENTIA   . Hypertension     no medications, no documented history per caregiver at preadmission  . GERD (gastroesophageal reflux disease)   . Seizures ([place])   . Developmental disability     developmentaly delayed  . Hx of radiation therapy 11/07/13-12/24/13    lung,60Gy/74f  . Mental retardation   . [place]izure disorder ([facility]   . [place] cancer ([place]   . Encounter for antineoplastic immunotherapy 04/05/2015    Medications:   Prescriptions prior to admission  Medication Sig Dispense Refill Last Dose  . bisacodyl (DULCOLAX) 5 MG EC tablet Take 5 mg by mouth daily as needed for moderate constipation (constipation).    unknown  . donepezil (ARICEPT) 10 MG tablet Take 10 mg by mouth at bedtime.    06/26/2015 at Unknown time  . erythromycin ophthalmic ointment Place 1 application into the left eye 3 (three) times daily.    06/27/2015 at Unknown time  . fluticasone (FLONASE) 50 MCG/ACT nasal spray Place 2 sprays into both nostrils daily.   06/27/2015 at Unknown time  . HYDROcodone-homatropine (HYCODAN) 5-1.5 MG/5ML syrup Take 5 mLs by mouth every 6 (six) hours as needed for cough. 120 mL 0 06/23/2015  . levETIRAcetam (KEPPRA) 750 MG tablet Take 2 tablets (1,500 mg total) by mouth 2 (two) times daily. 120 tablet 11 06/27/2015 at Unknown time  . LORazepam (ATIVAN) 1 MG tablet Take 2 mg by mouth at bedtime.   06/26/2015 at Unknown time  . omeprazole (PRILOSEC) 20 MG capsule Take 20 mg by mouth 2 (two) times daily.   06/27/2015 at Unknown time  . prochlorperazine (COMPAZINE) 10 MG tablet Take 10 mg by mouth every 6 (six) hours as needed for nausea or vomiting.   06/21/2015  . risperiDONE (RISPERDAL) 3 MG tablet Take 3 mg by mouth at bedtime.   06/26/2015 at Unknown time  . traZODone (DESYREL) 50 MG tablet Take 25 mg by mouth at bedtime.    06/26/2015 at Unknown time   Scheduled:  . [START ON  06/28/2015] antiseptic oral rinse  7 mL Mouth Rinse QID  . chlorhexidine gluconate  15 mL Mouth Rinse BID  . enoxaparin (LOVENOX) injection  40 mg Subcutaneous Q24H  . famotidine (PEPCID) IV  20 mg Intravenous Q12H  . lacosamide (VIMPAT) IV  100 mg Intravenous Q12H  . [START ON 06/28/2015] levETIRAcetam  1,500 mg Intravenous Q12H  . midazolam  2 mg Intravenous Once  . [START ON 06/28/2015] piperacillin-tazobactam (ZOSYN)  IV  3.375 g Intravenous Q8H  . sodium chloride  1,000 mL Intravenous Q1H  . [START ON 06/28/2015] vancomycin  500  mg Intravenous Q12H  . vancomycin  1,000 mg Intravenous Once   Infusions:  . fentaNYL infusion INTRAVENOUS 50 mcg/hr (06/27/15 2300)  . propofol (DIPRIVAN) infusion 40 mcg/kg/min (06/27/15 2300)   Assessment: 42 yoM s/p 3 seizures at group home now with elevated temp and WBCs.  Zosyn/Vancomycin per Rx for Sepsis.   Goal of Therapy:  Vancomycin trough level 15-20 mcg/ml  Plan:   Zosyn 3.375 Gm IV q8h EI  Vancomycin 1Gm x1 then '500mg'$  IV q12h  F/u SCr/cultures/levels  Lawana Pai R 06/27/2015,11:48 PM

## 2015-06-27 NOTE — Progress Notes (Signed)
Patient transported to CT scan on Ventilator with 100% O2.without incidence.

## 2015-06-27 NOTE — Consult Note (Signed)
Consult Reason for Consult: seizure Referring Physician: Dr Wyvonnia Dusky  CC: seizure  HPI: Marco [name] is an 64 y.o. male hx of seizures, developmental disability, lung CA presenting with breakthrough seizures. Brought in to ED from group homoe with active seizure activity. Prior to EMS arrival noted to have 3 witnessed GTC seizures, given 2.'5mg'$  of versed by EMS. Upon arrival to ED noted right gaze preference, unresponsive with shallow respirations. Intubated for airway protection.  Lactic acid level of 15.36, WBC 11.3. CT head imaging reviewed, overall unremarkable. Takes keppra '1500mg'$  BID at home.   Past Medical History  Diagnosis Date  . Mental disorder     sczizophrenia;moderate retardation  . Depression   . DEMENTIA   . Hypertension     no medications, no documented history per caregiver at preadmission  . GERD (gastroesophageal reflux disease)   . Seizures ([place])   . Developmental disability     developmentaly delayed  . Hx of radiation therapy 11/07/13-12/24/13    lung,60Gy/36f  . Mental retardation   . Seizure disorder ([facility]   . [facility] [place]   . Encounter for antineoplastic immunotherapy 04/05/2015    Past Surgical History  Procedure Laterality Date  . Cataract extraction w/phaco  07/12/2011    Procedure: CATARACT EXTRACTION PHACO AND INTRAOCULAR LENS PLACEMENT (IOC);  Surgeon: GAdonis Brook  [facility];  Service: Ophthalmology;  Laterality: Left;  .Marland KitchenEye surgery      L eye  . Video bronchoscopy Bilateral 10/01/2013    Procedure: VIDEO BRONCHOSCOPY WITHOUT FLUORO;  Surgeon: RCollene Gobble MD;  [place] [facility]  Service: Cardiopulmonary;  Laterality: Bilateral;    Family History  Problem Relation Age of Onset  . Hypertension Mother   . Hypertension Father     Social History:  reports that he quit smoking about 21 months ago. His smoking use included Cigarettes. He has a 17.5 pack-year smoking history. He does not have any smokeless tobacco history on  file. He reports that he does not drink alcohol or use illicit drugs.  No Known Allergies  Medications:  Scheduled: . lacosamide (VIMPAT) IV  100 mg Intravenous Q12H    ROS: Out of a complete 14 system review, the patient complains of only the following symptoms, and all other reviewed systems are negative. Breakthrough seizures  Physical Examination: Filed Vitals:   06/27/15 1535  BP: 105/70  Pulse: 126  Resp: 22   Physical Exam  Constitutional: He appears well-developed and well-nourished.  Psych: Affect appropriate to situation Eyes: No scleral injection HENT: No OP obstrucion Head: Normocephalic.  Cardiovascular: Normal rate and regular rhythm.  Respiratory: Effort normal and breath sounds normal.  GI: Soft. Bowel sounds are normal. No distension. There is no tenderness.  Skin: WDI  Neurologic Examination Mental Status: on propofol, recent dose of versed given Eyes closed, will briefly open to noxious stimuli. Non-verbal. Intermittent purposeful movement (will grab for ET tube, localizes to noxious stimuli) Cranial Nerves: II: optic discs not visualized, pupils equal round, reactive to light III,IV, VI: ptosis not present, eyes midline, no gaze deviation, dolls eye positive V,VII: face symmetric, + corneal reflex VIII: hearing normal bilaterally IX,X: gag reflex present XI: unable to test XII: unable to test Motor: No spontaneous movement noted. Withdrawals to noxious stimuli symmetrically in all extremities Tone and bulk:normal tone throughout; no atrophy noted Sensory: see above Deep Tendon Reflexes: 1+ and symmetric throughout Plantars: Right: downgoing   Left: downgoing Cerebellar: Unable to test Gait: unable to test  Laboratory Studies:   Basic Metabolic Panel:  Recent Labs Lab 06/22/15 0933 06/27/15 1533  NA 143 140  K 3.6 4.4  CL  --  107  CO2 28 16*  GLUCOSE 101 188*  BUN 11.4 15  CREATININE 1.0 1.38*  CALCIUM 9.1 9.0    Liver  Function Tests:  Recent Labs Lab 06/22/15 0933 06/27/15 1536  AST 8 31  ALT <9 10*  ALKPHOS 75 68  BILITOT 0.33 0.6  PROT 7.1 7.4  ALBUMIN 3.7 4.0   No results for input(s): LIPASE, AMYLASE in the last 168 hours. No results for input(s): AMMONIA in the last 168 hours.  CBC:  Recent Labs Lab 06/22/15 0933 06/27/15 1533  WBC 6.6 11.3*  NEUTROABS 4.8 8.0*  HGB 13.8 13.9  HCT 42.8 44.2  MCV 86.3 90.6  PLT 185 226    Cardiac Enzymes:  Recent Labs Lab 06/27/15 1533  TROPONINI <0.03    BNP: Invalid input(s): POCBNP  CBG: No results for input(s): GLUCAP in the last 168 hours.  Microbiology: Results for orders placed or performed during the hospital encounter of 04/17/15  Culture, blood (routine x 2)     Status: None   Collection Time: 04/17/15  9:42 PM  Result Value Ref Range Status   Specimen Description BLOOD RIGHT HAND  Final   Special Requests BOTTLES DRAWN AEROBIC ONLY 2CC  Final   Culture NO GROWTH 5 DAYS  Final   Report Status 04/22/2015 FINAL  Final  Culture, blood (routine x 2)     Status: None   Collection Time: 04/17/15  9:54 PM  Result Value Ref Range Status   Specimen Description BLOOD LEFT HAND  Final   Special Requests BOTTLES DRAWN AEROBIC ONLY 2CC  Final   Culture  Setup Time   Final    GRAM POSITIVE COCCI IN CLUSTERS AEROBIC BOTTLE ONLY CRITICAL RESULT CALLED TO, READ BACK BY AND VERIFIED WITH: CLAUDIO,R RN 04/18/15 1800 [place]    Culture   Final    STAPHYLOCOCCUS SPECIES (COAGULASE NEGATIVE) THE SIGNIFICANCE OF ISOLATING THIS ORGANISM FROM A SINGLE SET OF BLOOD CULTURES WHEN MULTIPLE SETS ARE DRAWN IS UNCERTAIN. PLEASE NOTIFY THE MICROBIOLOGY DEPARTMENT WITHIN ONE WEEK IF SPECIATION AND SENSITIVITIES ARE REQUIRED.    Report Status 04/20/2015 FINAL  Final  MRSA PCR Screening     Status: None   Collection Time: 04/18/15  1:25 AM  Result Value Ref Range Status   MRSA by PCR NEGATIVE NEGATIVE Final    Comment:         The GeneXpert MRSA Assay (FDA approved for NASAL specimens only), is one component of a comprehensive MRSA colonization surveillance program. It is not intended to diagnose MRSA infection nor to guide or monitor treatment for MRSA infections.     Coagulation Studies:  Recent Labs  06/27/15 1533  LABPROT 15.8*  INR 1.25    Urinalysis: No results for input(s): COLORURINE, LABSPEC, PHURINE, GLUCOSEU, HGBUR, BILIRUBINUR, KETONESUR, PROTEINUR, UROBILINOGEN, NITRITE, LEUKOCYTESUR in the last 168 hours.  Invalid input(s): APPERANCEUR  Lipid Panel:     Component Value Date/Time   CHOL 125 10/09/2014 0313   TRIG 67 04/18/2015 0559   HDL 47 10/09/2014 0313   CHOLHDL 2.7 10/09/2014 0313   VLDL 14 10/09/2014 0313   LDLCALC 64 10/09/2014 0313    HgbA1C:  Lab Results  Component Value Date   HGBA1C 5.6 10/09/2014    Urine Drug Screen:     Component Value Date/Time  LABOPIA NONE DETECTED 06/27/2015 1543   COCAINSCRNUR NONE DETECTED 06/27/2015 1543   LABBENZ POSITIVE* 06/27/2015 1543   AMPHETMU NONE DETECTED 06/27/2015 1543   THCU NONE DETECTED 06/27/2015 1543   LABBARB NONE DETECTED 06/27/2015 1543    Alcohol Level: No results for input(s): ETH in the last 168 hours.  Other results: .  Imaging: Dg Chest Portable 1 View  06/27/2015  CLINICAL DATA:  Endotracheal tube placement.  Seizures. EXAM: PORTABLE CHEST 1 VIEW COMPARISON:  April 17, 2015. FINDINGS: Stable cardiomediastinal silhouette. Endotracheal tube is seen projected over tracheal air shadow with distal tip approximately 2 cm above the carina. No pneumothorax is noted. Left upper lobe mass is significantly smaller compared to prior exam. Bony thorax is unremarkable. IMPRESSION: Endotracheal tube in grossly good position. Left upper lobe mass is significantly smaller compared to prior exam. Electronically Signed   By: Marijo Conception, M.D.   On: 06/27/2015 15:55     Assessment/Plan:  64y/o hx of seizures,  developmental disability, lung CA presenting with breakthrough seizures. Intubated in ED for airway protection. Currently on sedation but localizing and withdrawing from noxious stimuli making NCSE less likely.   -continue keppra '1500mg'$  BID -added vimpat '100mg'$  BID -seizure precautions -EEG -will continue to follow  This patient is critically ill and at significant risk of neurological worsening, death and care requires constant monitoring of vital signs, hemodynamics,respiratory and cardiac monitoring,review of multiple databases, neurological assessment, discussion with family, other specialists and medical decision making of high complexity. I spent 45 inutes of neurocritical care time in the care of this patient.    Jim Like, DO [facility] [phone]  If 7pm- 7am, please page neurology on call as listed in [name]. 06/27/2015, 4:47 PM

## 2015-06-27 NOTE — Progress Notes (Signed)
Pt transported from ED to 1225 on Fi02= 1.00.  Pt tolerated well.

## 2015-06-27 NOTE — ED Notes (Signed)
After sedation, pt continues to put hand toward tube. Dr Janann Colonel made aware and medical restraints applied

## 2015-06-27 NOTE — ED Provider Notes (Signed)
CSN: [id number]     Arrival date & time 06/27/15  1509 History   First MD Initiated Contact with Patient 06/27/15 1514     Chief Complaint  Patient presents with  . Seizures     (Consider location/radiation/quality/duration/timing/severity/associated sxs/prior Treatment) HPI Comments: Level V caveat for unresponsiveness. Patient brought by EMS from group home after witnessed seizure. Patient with history of seizure disorder apparently had 3 witnessed tonic-clonic seizures today EMS gave him 2-1/2 mg of Versed and IV. They did witnessed tonic-clonic activity. Patient is now obtunded with right-sided gaze. He is not following commands. He has shallow respirations. He is not hypoxic. Decision made to intubate on arrival for airway protection.  Patient is a 64 y.o. male presenting with seizures. The history is provided by the EMS personnel. The history is limited by the condition of the patient.  Seizures   Past Medical History  Diagnosis Date  . Mental disorder     sczizophrenia;moderate retardation  . Depression   . DEMENTIA   . Hypertension     no medications, no documented history per caregiver at preadmission  . GERD (gastroesophageal reflux disease)   . Seizures ([place])   . Developmental disability     developmentaly delayed  . Hx of radiation therapy 11/07/13-12/24/13    lung,60Gy/59f  . Mental retardation   . [place]   . [place]   . Encounter for antineoplastic immunotherapy 04/05/2015   Past Surgical History  Procedure Laterality Date  . Cataract extraction w/phaco  07/12/2011    Procedure: CATARACT EXTRACTION PHACO AND INTRAOCULAR LENS PLACEMENT (IOC);  Surgeon: GAdonis Brook  [facility];  Service: Ophthalmology;  Laterality: Left;  .Marland KitchenEye surgery      L eye  . Video bronchoscopy Bilateral 10/01/2013    Procedure: VIDEO BRONCHOSCOPY WITHOUT FLUORO;  Surgeon: RCollene Gobble MD;  [facility]  Service: Cardiopulmonary;  Laterality:  Bilateral;   Family History  Problem Relation Age of Onset  . Hypertension Mother   . Hypertension Father    Social History  Substance Use Topics  . Smoking status: Former Smoker -- 0.50 packs/day for 35 years    Types: Cigarettes    Quit date: 09/07/2013  . Smokeless tobacco: None  . Alcohol Use: No    Review of Systems  Unable to perform ROS: Patient unresponsive  Neurological: Positive for seizures.      Allergies  Review of patient's allergies indicates no known allergies.  Home Medications   Prior to Admission medications   Medication Sig Start Date End Date Taking? Authorizing Provider  bisacodyl (DULCOLAX) 5 MG EC tablet Take 5 mg by mouth daily as needed for moderate constipation (constipation).    Yes Historical Provider, MD  donepezil (ARICEPT) 10 MG tablet Take 10 mg by mouth at bedtime.    Yes Historical Provider, MD  erythromycin ophthalmic ointment Place 1 application into the left eye 3 (three) times daily.  05/28/15  Yes Historical Provider, MD  fluticasone (FLONASE) 50 MCG/ACT nasal spray Place 2 sprays into both nostrils daily.   Yes Historical Provider, MD  HYDROcodone-homatropine (HYCODAN) 5-1.5 MG/5ML syrup Take 5 mLs by mouth every 6 (six) hours as needed for cough. 06/08/15  Yes MCurt Bears MD  levETIRAcetam (KEPPRA) 750 MG tablet Take 2 tablets (1,500 mg total) by mouth 2 (two) times daily. 05/26/15  Yes AMelvenia Beam MD  LORazepam (ATIVAN) 1 MG tablet Take 2 mg by mouth at bedtime.   Yes Historical  Provider, MD  omeprazole (PRILOSEC) 20 MG capsule Take 20 mg by mouth 2 (two) times daily.   Yes Historical Provider, MD  prochlorperazine (COMPAZINE) 10 MG tablet Take 10 mg by mouth every 6 (six) hours as needed for nausea or vomiting.   Yes Historical Provider, MD  risperiDONE (RISPERDAL) 3 MG tablet Take 3 mg by mouth at bedtime.   Yes Historical Provider, MD  traZODone (DESYREL) 50 MG tablet Take 25 mg by mouth at bedtime.    Yes Historical  Provider, MD   BP 92/74 mmHg  Pulse 109  Temp(Src) 100.5 F (38.1 C) (Oral)  Resp 20  Ht 6' (1.829 m)  Wt 143 lb 1.3 oz (64.9 kg)  BMI 19.40 kg/m2  SpO2 100% Physical Exam  Constitutional: He appears well-developed and well-nourished. No distress.  HENT:  Head: Normocephalic and atraumatic.  Mouth/Throat: Oropharynx is clear and moist. No oropharyngeal exudate.  Eyes:  L sided gaze  Neck: Normal range of motion. Neck supple.  Cardiovascular: Normal rate and normal heart sounds.   tachycardic  Pulmonary/Chest: Effort normal and breath sounds normal.  Shallow respirations  Abdominal: There is no tenderness. There is no rebound and no guarding.  Genitourinary:  Incontinent of urine  Musculoskeletal: Normal range of motion. He exhibits no edema or tenderness.  Neurological:  Unresponsive, not following commands, no spontaneous movement    ED Course  .Intubation Date/Time: 06/27/2015 3:37 PM Performed by: Ezequiel Essex Authorized by: Ezequiel Essex Consent: The procedure was performed in an emergent situation. Risks and benefits: risks, benefits and alternatives were discussed Consent given by: patient Patient identity confirmed: provided demographic data and arm band Time out: Immediately prior to procedure a "time out" was called to verify the correct patient, procedure, equipment, support staff and site/side marked as required. Indications: respiratory failure and  airway protection Intubation method: video-assisted Patient status: paralyzed (RSI) Preoxygenation: nonrebreather mask Sedatives: etomidate Paralytic: succinylcholine Laryngoscope size: Mac 4 Tube size: 7.5 mm Tube type: cuffed Number of attempts: 1 Ventilation between attempts: BVM Cricoid pressure: no Cords visualized: yes Post-procedure assessment: chest rise and ETCO2 monitor Breath sounds: equal Cuff inflated: yes ETT to lip: 25 cm Tube secured with: ETT holder Chest x-ray interpreted by  me and radiologist. Chest x-ray findings: endotracheal tube in appropriate position Patient tolerance: Patient tolerated the procedure well with no immediate complications   (including critical care time) Labs Review Labs Reviewed  CBC WITH DIFFERENTIAL/PLATELET - Abnormal; Notable for the following:    WBC 11.3 (*)    Neutro Abs 8.0 (*)    All other components within normal limits  BASIC METABOLIC PANEL - Abnormal; Notable for the following:    CO2 16 (*)    Glucose, Bld 188 (*)    Creatinine, Ser 1.38 (*)    GFR calc non Af Amer 52 (*)    GFR calc Af Amer 60 (*)    Anion gap 17 (*)    All other components within normal limits  URINALYSIS, ROUTINE W REFLEX MICROSCOPIC (NOT AT [facility]) - Abnormal; Notable for the following:    APPearance CLOUDY (*)    Hgb urine dipstick TRACE (*)    Protein, ur 100 (*)    All other components within normal limits  BLOOD GAS, ARTERIAL - Abnormal; Notable for the following:    pCO2 arterial 32.2 (*)    pO2, Arterial 568 (*)    All other components within normal limits  PROTIME-INR - Abnormal; Notable for the following:  Prothrombin Time 15.8 (*)    All other components within normal limits  URINE RAPID DRUG SCREEN, HOSP PERFORMED - Abnormal; Notable for the following:    Benzodiazepines POSITIVE (*)    All other components within normal limits  HEPATIC FUNCTION PANEL - Abnormal; Notable for the following:    ALT 10 (*)    All other components within normal limits  URINE MICROSCOPIC-ADD ON - Abnormal; Notable for the following:    Bacteria, UA MANY (*)    Casts HYALINE CASTS (*)    All other components within normal limits  LACTIC ACID, PLASMA - Abnormal; Notable for the following:    Lactic Acid, Venous 6.7 (*)    All other components within normal limits  BLOOD GAS, ARTERIAL - Abnormal; Notable for the following:    pH, Arterial 7.567 (*)    pCO2 arterial 22.7 (*)    pO2, Arterial 135 (*)    All other components within normal limits   I-STAT CG4 LACTIC ACID, ED - Abnormal; Notable for the following:    Lactic Acid, Venous 15.36 (*)    All other components within normal limits  MRSA PCR SCREENING  CULTURE, BLOOD (ROUTINE X 2)  CULTURE, BLOOD (ROUTINE X 2)  TROPONIN I  CK  CBC  BASIC METABOLIC PANEL  BLOOD GAS, ARTERIAL  LACTIC ACID, PLASMA  LACTIC ACID, PLASMA  CORTISOL  TROPONIN I  PROCALCITONIN  APTT  I-STAT CG4 LACTIC ACID, ED  TYPE AND SCREEN    Imaging Review Ct Head Wo Contrast  06/27/2015  CLINICAL DATA:  Seizure. EXAM: CT HEAD WITHOUT CONTRAST CT CERVICAL SPINE WITHOUT CONTRAST TECHNIQUE: Multidetector CT imaging of the head and cervical spine was performed following the standard protocol without intravenous contrast. Multiplanar CT image reconstructions of the cervical spine were also generated. COMPARISON:  CT scan of head of April 17, 2015. FINDINGS: CT HEAD FINDINGS Bony calvarium appears intact. Mild diffuse cortical atrophy is noted. No mass effect or midline shift is noted. Ventricular size is within normal limits. There is no evidence of mass lesion, hemorrhage or acute infarction. CT CERVICAL SPINE FINDINGS No fracture is noted. Grade 1 retrolisthesis of C3-4 is noted secondary to degenerative disc disease at this level. Anterior osteophyte formation is also noted at C4-5, C5-6 and C6-7. Mild degenerative disc disease is noted at C6-7. Posterior facet joints are unremarkable. IMPRESSION: Mild diffuse cortical atrophy. No acute intracranial abnormality seen. Multilevel degenerative disc disease. No acute abnormality seen in the cervical spine. Electronically Signed   By: Marijo Conception, M.D.   On: 06/27/2015 17:37   Ct Cervical Spine Wo Contrast  06/27/2015  CLINICAL DATA:  Seizure. EXAM: CT HEAD WITHOUT CONTRAST CT CERVICAL SPINE WITHOUT CONTRAST TECHNIQUE: Multidetector CT imaging of the head and cervical spine was performed following the standard protocol without intravenous contrast. Multiplanar  CT image reconstructions of the cervical spine were also generated. COMPARISON:  CT scan of head of April 17, 2015. FINDINGS: CT HEAD FINDINGS Bony calvarium appears intact. Mild diffuse cortical atrophy is noted. No mass effect or midline shift is noted. Ventricular size is within normal limits. There is no evidence of mass lesion, hemorrhage or acute infarction. CT CERVICAL SPINE FINDINGS No fracture is noted. Grade 1 retrolisthesis of C3-4 is noted secondary to degenerative disc disease at this level. Anterior osteophyte formation is also noted at C4-5, C5-6 and C6-7. Mild degenerative disc disease is noted at C6-7. Posterior facet joints are unremarkable. IMPRESSION: Mild diffuse cortical atrophy. No  acute intracranial abnormality seen. Multilevel degenerative disc disease. No acute abnormality seen in the cervical spine. Electronically Signed   By: Marijo Conception, M.D.   On: 06/27/2015 17:37   Dg Chest Portable 1 View  06/27/2015  CLINICAL DATA:  Endotracheal tube placement.  Seizures. EXAM: PORTABLE CHEST 1 VIEW COMPARISON:  April 17, 2015. FINDINGS: Stable cardiomediastinal silhouette. Endotracheal tube is seen projected over tracheal air shadow with distal tip approximately 2 cm above the carina. No pneumothorax is noted. Left upper lobe mass is significantly smaller compared to prior exam. Bony thorax is unremarkable. IMPRESSION: Endotracheal tube in grossly good position. Left upper lobe mass is significantly smaller compared to prior exam. Electronically Signed   By: Marijo Conception, M.D.   On: 06/27/2015 15:55   I have personally reviewed and evaluated these images and lab results as part of my medical decision-making.   EKG Interpretation None      MDM   Final diagnoses:  Status epilepticus [facility])   Patient brought in unresponsive after episodes of witnessed seizures. He is nonresponsive on arrival not presenting his airway. Is left-sided gaze which is questionable for ongoing  seizure activity. He is given IV Versed. Decision made to intubate for airway protection.  Patient intubated as above. He is given IV Keppra and IV propofol. IV Versed. CT head will be obtained. Lactate 15 consistent with seizures. WBC 11.   CT head and C-spine negative. Patient difficult to sedate and requiring additional boluses of Versed and abdominal continuous propofol infusion. No ongoing seizure activity. Seen by neurology. Admission to ICU d/w PCCM.  ED ECG REPORT   Date: 06/27/2015  Rate: 153  Rhythm: sinus tachycardia  QRS Axis: normal  Intervals: normal  ST/T Wave abnormalities: nonspecific ST/T changes  Conduction Disutrbances:none  Narrative Interpretation:   Old EKG Reviewed: none available  I have personally reviewed the EKG tracing and agree with the computerized printout as noted.   CRITICAL CARE Performed by: Ezequiel Essex Total critical care time: 45 Critical care time was exclusive of separately billable procedures and treating other patients. Critical care was necessary to treat or prevent imminent or life-threatening deterioration. Critical care was time spent personally by me on the following activities: development of treatment plan with patient and/or surrogate as well as nursing, discussions with consultants, evaluation of patient's response to treatment, examination of patient, obtaining history from patient or surrogate, ordering and performing treatments and interventions, ordering and review of laboratory studies, ordering and review of radiographic studies, pulse oximetry and re-evaluation of patient's condition.    Ezequiel Essex, MD 06/27/15 [phone]

## 2015-06-27 NOTE — H&P (Signed)
PULMONARY / CRITICAL CARE MEDICINE   Name: Marco [name] MRN: [id number] DOB: 09/14/1950    ADMISSION DATE:  06/27/2015 CONSULTATION DATE:  06/27/2015  REFERRING MD :  EDP  CHIEF COMPLAINT:  Seizure  INITIAL PRESENTATION: 64 year old male with history of seizure presented to [facility] ED 10/23 after having 3 witnessed tonic-clonic seizures at group home where he resides. In ED he required intubation for airway protection. PCCM to admit with neurology consultation   STUDIES:  CT head 10/23 > Mild diffuse cortical atrophy. No acute intracranial abnormality  seen. Multilevel degenerative disc disease. No acute abnormality seen in the cervical spine.  SIGNIFICANT EVENTS: 06/27/2015 > admit to ICU on ventilator   HISTORY OF PRESENT ILLNESS:  64 year old male with past medical history as below, which includes intellectual disability, dementia, hypertension, seizures, lung cancer status post chemoradiation with ongoing immunotherapy. He was recently admitted in August 2016 for status epilepticus. He required mechanical ventilation and adjustment of his seizure medications. He had no seizure activity as an inpatient and was discharged back to group home. During that admission he had an MRI and was found to have no metastasis to the brain. 06/27/2015 he had 3 witnessed seizures at the group home. They were described as generalized, tonic-clonic in nature. He is brought to the emergency department by EMS, where he was noted to be unresponsive requiring intubation for airway protection.. CT scan of the head was negative for acute change. He was evaluated by neurology who continued Keppra and added Vimpat. Schedule EEG for the patient 10/24. Patient has had no seizure activity since time of admission. PCCM to see.  PAST MEDICAL HISTORY :   has a past medical history of Mental disorder; Depression; DEMENTIA; Hypertension; GERD (gastroesophageal reflux disease); Seizures ([name]); Developmental disability;  radiation therapy (11/07/13-12/24/13); Mental retardation; Seizure disorder ([name]); Lung cancer ([name]); and Encounter for antineoplastic immunotherapy (04/05/2015).  has past surgical history that includes Cataract extraction w/PHACO (07/12/2011); Eye surgery; and Video bronchoscopy (Bilateral, 10/01/2013). Prior to Admission medications   Medication Sig Start Date End Date Taking? Authorizing Provider  bisacodyl (DULCOLAX) 5 MG EC tablet Take 5 mg by mouth daily as needed for moderate constipation (constipation).    Yes Historical Provider, MD  donepezil (ARICEPT) 10 MG tablet Take 10 mg by mouth at bedtime.    Yes Historical Provider, MD  erythromycin ophthalmic ointment Place 1 application into the left eye 3 (three) times daily.  05/28/15  Yes Historical Provider, MD  fluticasone (FLONASE) 50 MCG/ACT nasal spray Place 2 sprays into both nostrils daily.   Yes Historical Provider, MD  HYDROcodone-homatropine (HYCODAN) 5-1.5 MG/5ML syrup Take 5 mLs by mouth every 6 (six) hours as needed for cough. 06/08/15  Yes Marco Bears, MD  levETIRAcetam (KEPPRA) 750 MG tablet Take 2 tablets (1,500 mg total) by mouth 2 (two) times daily. 05/26/15  Yes Marco Beam, MD  LORazepam (ATIVAN) 1 MG tablet Take 2 mg by mouth at bedtime.   Yes Historical Provider, MD  omeprazole (PRILOSEC) 20 MG capsule Take 20 mg by mouth 2 (two) times daily.   Yes Historical Provider, MD  prochlorperazine (COMPAZINE) 10 MG tablet Take 10 mg by mouth every 6 (six) hours as needed for nausea or vomiting.   Yes Historical Provider, MD  risperiDONE (RISPERDAL) 3 MG tablet Take 3 mg by mouth at bedtime.   Yes Historical Provider, MD  traZODone (DESYREL) 50 MG tablet Take 25 mg by mouth at bedtime.    Yes Historical Provider,  MD   No Known Allergies  FAMILY HISTORY:  indicated that his mother is deceased. He indicated that his father is deceased. He indicated that his sister is alive.  SOCIAL HISTORY:  reports that he quit smoking about 21  months ago. His smoking use included Cigarettes. He has a 17.5 pack-year smoking history. He does not have any smokeless tobacco history on file. He reports that he does not drink alcohol or use illicit drugs.  REVIEW OF SYSTEMS:  Unable as patient is encepahlopathic  SUBJECTIVE:   VITAL SIGNS: Temp:  [98.9 F (37.2 C)-100 F (37.8 C)] 100 F (37.8 C) (10/23 1805) Pulse Rate:  [96-126] 104 (10/23 1912) Resp:  [16-25] 21 (10/23 1912) BP: (105-140)/(70-117) 116/94 mmHg (10/23 1912) SpO2:  [98 %-100 %] 100 % (10/23 1912) FiO2 (%):  [40 %-100 %] 60 % (10/23 1912) Weight:  [62.5 kg (137 lb 12.6 oz)-64.9 kg (143 lb 1.3 oz)] 64.9 kg (143 lb 1.3 oz) (10/23 1916) HEMODYNAMICS:   VENTILATOR SETTINGS: Vent Mode:  [-] PRVC FiO2 (%):  [40 %-100 %] 60 % Set Rate:  [20 bmp] 20 bmp Vt Set:  [550 mL] 550 mL PEEP:  [5 cmH20] 5 cmH20 Plateau Pressure:  [23 cmH20] 23 cmH20 INTAKE / OUTPUT:  Intake/Output Summary (Last 24 hours) at 06/27/15 1929 Last data filed at 06/27/15 1900  Gross per 24 hour  Intake  16.23 ml  Output     15 ml  Net   1.23 ml    PHYSICAL EXAMINATION: General:  Male of normal body habitus in no acute distress on ventilator Neuro:  Sedated on ventilator occasional left upper extremity tremor.Marland Kitchen HEENT:  [name]/AT, PERRL, no appreciable JVD Cardiovascular:  RRR, no MRG Lungs:  Clear bilateral breath sounds Abdomen:  Soft nondistended Musculoskeletal:  No acute deformity Skin:  Grossly intact  LABS:  CBC  Recent Labs Lab 06/22/15 0933 06/27/15 1533  WBC 6.6 11.3*  HGB 13.8 13.9  HCT 42.8 44.2  PLT 185 226   Coag's  Recent Labs Lab 06/27/15 1533  INR 1.25   BMET  Recent Labs Lab 06/22/15 0933 06/27/15 1533  NA 143 140  K 3.6 4.4  CL  --  107  CO2 28 16*  BUN 11.4 15  CREATININE 1.0 1.38*  GLUCOSE 101 188*   Electrolytes  Recent Labs Lab 06/22/15 0933 06/27/15 1533  CALCIUM 9.1 9.0   Sepsis Markers  Recent Labs Lab 06/27/15 1542   LATICACIDVEN 15.36*   ABG  Recent Labs Lab 06/27/15 1642  PHART 7.433  PCO2ART 32.2*  PO2ART 568*   Liver Enzymes  Recent Labs Lab 06/22/15 0933 06/27/15 1536  AST 8 31  ALT <9 10*  ALKPHOS 75 68  BILITOT 0.33 0.6  ALBUMIN 3.7 4.0   Cardiac Enzymes  Recent Labs Lab 06/27/15 1533  TROPONINI <0.03   Glucose No results for input(s): GLUCAP in the last 168 hours.  Imaging Ct Head Wo Contrast  06/27/2015  CLINICAL DATA:  Seizure. EXAM: CT HEAD WITHOUT CONTRAST CT CERVICAL SPINE WITHOUT CONTRAST TECHNIQUE: Multidetector CT imaging of the head and cervical spine was performed following the standard protocol without intravenous contrast. Multiplanar CT image reconstructions of the cervical spine were also generated. COMPARISON:  CT scan of head of April 17, 2015. FINDINGS: CT HEAD FINDINGS Bony calvarium appears intact. Mild diffuse cortical atrophy is noted. No mass effect or midline shift is noted. Ventricular size is within normal limits. There is no evidence of mass lesion, hemorrhage or  acute infarction. CT CERVICAL SPINE FINDINGS No fracture is noted. Grade 1 retrolisthesis of C3-4 is noted secondary to degenerative disc disease at this level. Anterior osteophyte formation is also noted at C4-5, C5-6 and C6-7. Mild degenerative disc disease is noted at C6-7. Posterior facet joints are unremarkable. IMPRESSION: Mild diffuse cortical atrophy. No acute intracranial abnormality seen. Multilevel degenerative disc disease. No acute abnormality seen in the cervical spine. Electronically Signed   By: Marijo Conception, M.D.   On: 06/27/2015 17:37   Ct Cervical Spine Wo Contrast  06/27/2015  CLINICAL DATA:  Seizure. EXAM: CT HEAD WITHOUT CONTRAST CT CERVICAL SPINE WITHOUT CONTRAST TECHNIQUE: Multidetector CT imaging of the head and cervical spine was performed following the standard protocol without intravenous contrast. Multiplanar CT image reconstructions of the cervical spine were  also generated. COMPARISON:  CT scan of head of April 17, 2015. FINDINGS: CT HEAD FINDINGS Bony calvarium appears intact. Mild diffuse cortical atrophy is noted. No mass effect or midline shift is noted. Ventricular size is within normal limits. There is no evidence of mass lesion, hemorrhage or acute infarction. CT CERVICAL SPINE FINDINGS No fracture is noted. Grade 1 retrolisthesis of C3-4 is noted secondary to degenerative disc disease at this level. Anterior osteophyte formation is also noted at C4-5, C5-6 and C6-7. Mild degenerative disc disease is noted at C6-7. Posterior facet joints are unremarkable. IMPRESSION: Mild diffuse cortical atrophy. No acute intracranial abnormality seen. Multilevel degenerative disc disease. No acute abnormality seen in the cervical spine. Electronically Signed   By: Marijo Conception, M.D.   On: 06/27/2015 17:37   Dg Chest Portable 1 View  06/27/2015  CLINICAL DATA:  Endotracheal tube placement.  Seizures. EXAM: PORTABLE CHEST 1 VIEW COMPARISON:  April 17, 2015. FINDINGS: Stable cardiomediastinal silhouette. Endotracheal tube is seen projected over tracheal air shadow with distal tip approximately 2 cm above the carina. No pneumothorax is noted. Left upper lobe mass is significantly smaller compared to prior exam. Bony thorax is unremarkable. IMPRESSION: Endotracheal tube in grossly good position. Left upper lobe mass is significantly smaller compared to prior exam. Electronically Signed   By: Marijo Conception, M.D.   On: 06/27/2015 15:55     ASSESSMENT / PLAN:  PULMONARY OETT 10/23 >>> A: VDRF in setting of seizures Stage IIA (T2a., N1, M0) non-small cell lung cancer, squamous cell carcinoma LUL  P:   Full vent support Assess ABG and adjust vent settings accordingly VAP bundle SBT in AM S/p chemoradiation last dose 12/2013 Currently on Immunotherapy with Nivolumab 3 mg/kg given every 2 weeks. Status post 6 cycles.  CARDIOVASCULAR A:  Hypertension  P:   Telemetry Not on home antihypertensives  RENAL A:   Acute renal failure (baseline creatinine 1), likely due to rhabdomyolysis in setting seizure. High AG metabolic acidosis > lactic (improving)  P:   IV hydration CK Repeat Bmet in morning  GASTROINTESTINAL A:   GERD  P:   Nothing by mouth Pepcid for SUP  HEMATOLOGIC A:   Lung Ca as above  P:  Follow CBC Lovenox for VTE ppx  INFECTIOUS A:   Leukocytosis  P:   Monitor off antibiotics Trend his WBC and fever curve  ENDOCRINE A:   No acute issues  P:   Follow glucose on chemistry  NEUROLOGIC A:   Seizure Acute encephalopathy secondary to recurrent seizures/post-ictal Depression Dementia Intellectual disability  P:   RASS goal: -2 Neurology following Propofol/ fentanyl PRN versed EEG in am  Holding home  Aricept, ativan, risperidone, trazodone   FAMILY  - Updates: No family available  - Inter-disciplinary family meet or Palliative Care meeting due by:  07/04/2015    Georgann Housekeeper, AGACNP-BC [facility] Care Pager [phone] or [phone]  06/27/2015 8:00 PM

## 2015-06-27 NOTE — ED Notes (Addendum)
1520 Dr. Wyvonnia Dusky at bedside. 1524 pt was admin Etomidate '20mg'$ , Succ-'100mg'$ ,and Versed '2mg'$  and prepare for RSI. 1526 Pt with ET tube in place at 7.5cm at lips, CO2 color change and (+) placement with auscultation via Dr. Wyvonnia Dusky. 1535 pt was placed on ventilator with settings TV:550, Rate-20 and FiO2-100% via RT. 1539 Started on Proforol 1.541mg/kg/hr and increased at 1543 to 421m/kg/hr and increased to 41m21mkg/hr at 1549.   1549 OG tube in place with verification of auscultation.

## 2015-06-27 NOTE — Progress Notes (Signed)
PCCM INTERVAL PROGRESS NOTE  Admit for seizures.  New onset fever up to 102. No obvious source of infection. D/w neurology concerning need for LP. Not indicated at this time. Broad-spectrum ABX initiated by [facility] MD. Will continue to monitor.    Georgann Housekeeper, AGACNP-BC [facility] Pulmonology/Critical Care Pager [phone] or [phone]  06/27/2015 11:44 PM

## 2015-06-28 ENCOUNTER — Inpatient Hospital Stay (HOSPITAL_COMMUNITY)
Admit: 2015-06-28 | Discharge: 2015-06-28 | Disposition: A | Discharge status: Home / Self Care | Payer: Medicare Other | Attending: Neurology | Admitting: Neurology

## 2015-06-28 DIAGNOSIS — G40909 Epilepsy, unspecified, not intractable, without status epilepticus: Secondary | ICD-10-CM | POA: Diagnosis not present

## 2015-06-28 DIAGNOSIS — G40901 Epilepsy, unspecified, not intractable, with status epilepticus: Secondary | ICD-10-CM | POA: Diagnosis not present

## 2015-06-28 DIAGNOSIS — R569 Unspecified convulsions: Secondary | ICD-10-CM | POA: Diagnosis not present

## 2015-06-28 LAB — BLOOD GAS, ARTERIAL
ACID-BASE DEFICIT: 1.2 mmol/L (ref 0.0–2.0)
Bicarbonate: 21.7 mEq/L (ref 20.0–24.0)
DRAWN BY: 235321
FIO2: 0.3
LHR: 20 {breaths}/min
MECHVT: 460 mL
O2 SAT: 89.2 %
PEEP: 5 cmH2O
PO2 ART: 63.1 mmHg — AB (ref 80.0–100.0)
Patient temperature: 100.6
TCO2: 19.6 mmol/L (ref 0–100)
pCO2 arterial: 33.7 mmHg — ABNORMAL LOW (ref 35.0–45.0)
pH, Arterial: 7.43 (ref 7.350–7.450)

## 2015-06-28 LAB — BASIC METABOLIC PANEL
ANION GAP: 6 (ref 5–15)
BUN: 14 mg/dL (ref 6–20)
CHLORIDE: 112 mmol/L — AB (ref 101–111)
CO2: 23 mmol/L (ref 22–32)
Calcium: 7.9 mg/dL — ABNORMAL LOW (ref 8.9–10.3)
Creatinine, Ser: 1.15 mg/dL (ref 0.61–1.24)
GFR calc Af Amer: >60 mL/min (ref >60)
GFR calc non Af Amer: >60 mL/min (ref >60)
GLUCOSE: 125 mg/dL — AB (ref 65–99)
POTASSIUM: 3.1 mmol/L — AB (ref 3.5–5.1)
Sodium: 141 mmol/L (ref 135–145)

## 2015-06-28 LAB — GLUCOSE, CAPILLARY
GLUCOSE-CAPILLARY: 61 mg/dL — AB (ref 65–99)
GLUCOSE-CAPILLARY: 92 mg/dL (ref 65–99)
GLUCOSE-CAPILLARY: 126 mg/dL — AB (ref 65–99)
GLUCOSE-CAPILLARY: 79 mg/dL (ref 65–99)
GLUCOSE-CAPILLARY: 63 mg/dL — AB (ref 65–99)
GLUCOSE-CAPILLARY: 85 mg/dL (ref 65–99)
Glucose-Capillary: 92 mg/dL (ref 65–99)
Glucose-Capillary: 72 mg/dL (ref 65–99)
Glucose-Capillary: 86 mg/dL (ref 65–99)

## 2015-06-28 LAB — CBC
HEMATOCRIT: 36.5 % — AB (ref 39.0–52.0)
HEMOGLOBIN: 12.1 g/dL — AB (ref 13.0–17.0)
MCH: 28.9 pg (ref 26.0–34.0)
MCHC: 33.2 g/dL (ref 30.0–36.0)
MCV: 87.1 fL (ref 78.0–100.0)
Platelets: 185 10*3/uL (ref 150–400)
RBC: 4.19 MIL/uL — AB (ref 4.22–5.81)
RDW: 15 % (ref 11.5–15.5)
WBC: 11 10*3/uL — AB (ref 4.0–10.5)

## 2015-06-28 LAB — TROPONIN I

## 2015-06-28 LAB — LACTIC ACID, PLASMA
Lactic Acid, Venous: 1.8 mmol/L (ref 0.5–2.0)
Lactic Acid, Venous: 1.5 mmol/L (ref 0.5–2.0)

## 2015-06-28 LAB — CORTISOL: CORTISOL PLASMA: 10.7 ug/dL

## 2015-06-28 LAB — ABO/RH: ABO/RH(D): O POS

## 2015-06-28 LAB — APTT: aPTT: 29 seconds (ref 24–37)

## 2015-06-28 LAB — TYPE AND SCREEN
ABO/RH(D): O POS
ANTIBODY SCREEN: NEGATIVE

## 2015-06-28 LAB — PROCALCITONIN: Procalcitonin: 0.22 ng/mL

## 2015-06-28 MED ORDER — POTASSIUM CHLORIDE 20 MEQ/15ML (10%) PO SOLN
40.0000 meq | Freq: Two times a day (BID) | ORAL | Status: AC
  Administered 2015-06-28 (×2): 40 meq
  Filled 2015-06-28 (×2): qty 30

## 2015-06-28 MED ORDER — DEXTROSE 50 % IV SOLN
INTRAVENOUS | Status: AC
  Filled 2015-06-28: qty 50

## 2015-06-28 MED ORDER — DEXTROSE 5 % IV SOLN
0.0000 ug/min | INTRAVENOUS | Status: DC
  Administered 2015-06-28: 80 ug/min via INTRAVENOUS
  Administered 2015-06-28: 30 ug/min via INTRAVENOUS
  Administered 2015-06-28: 45 ug/min via INTRAVENOUS
  Administered 2015-06-28: 5 ug/min via INTRAVENOUS
  Administered 2015-06-28: 30 ug/min via INTRAVENOUS
  Filled 2015-06-28 (×4): qty 1

## 2015-06-28 MED ORDER — DEXTROSE-NACL 5-0.9 % IV SOLN
INTRAVENOUS | Status: DC
  Administered 2015-06-28: 15:00:00 via INTRAVENOUS
  Administered 2015-06-28: 75 mL via INTRAVENOUS
  Administered 2015-06-29 – 2015-07-05 (×6): via INTRAVENOUS

## 2015-06-28 MED ORDER — DEXTROSE 50 % IV SOLN
INTRAVENOUS | Status: AC
  Administered 2015-06-28: 25 mL
  Filled 2015-06-28: qty 50

## 2015-06-28 MED ORDER — DEXTROSE 50 % IV SOLN
25.0000 mL | Freq: Once | INTRAVENOUS | Status: AC
  Administered 2015-06-28: 25 mL via INTRAVENOUS

## 2015-06-28 MED ORDER — MIDAZOLAM HCL 2 MG/2ML IJ SOLN
2.0000 mg | INTRAMUSCULAR | Status: DC | PRN
  Administered 2015-06-28 – 2015-06-29 (×8): 2 mg via INTRAVENOUS
  Filled 2015-06-28 (×10): qty 2

## 2015-06-28 MED ORDER — SODIUM CHLORIDE 0.9 % IV BOLUS (SEPSIS)
1000.0000 mL | Freq: Once | INTRAVENOUS | Status: AC
  Administered 2015-06-28: 1000 mL via INTRAVENOUS

## 2015-06-28 NOTE — Care Management Note (Signed)
Case Management Note  Patient Details  Name: Marco [name] MRN: [id number] Date of Birth: May 07, 1951  Subjective/Objective:              Seizures, resp failure   intubated   Action/Plan:Date: June 28, 2015 Chart reviewed for concurrent status and case management needs. Will continue to follow patient for changes and needs: Velva Harman, RN, BSN, [place]   [phone]   Expected Discharge Date:                  Expected Discharge Plan:  Home/Self Care  In-House Referral:  Clinical Social Work  Discharge planning Services  CM Consult  Post Acute Care Choice:  NA Choice offered to:  NA  DME Arranged:  N/A DME Agency:  NA  HH Arranged:  NA HH Agency:  NA  Status of Service:  In process, will continue to follow  Medicare Important Message Given:    Date Medicare IM Given:    Medicare IM give by:    Date Additional Medicare IM Given:    Additional Medicare Important Message give by:     If discussed at [place] of Stay Meetings, dates discussed:    Additional Comments:  Leeroy Cha, RN 06/28/2015, 9:59 AM

## 2015-06-28 NOTE — Procedures (Signed)
ELECTROENCEPHALOGRAM REPORT   Patient: Marco [name]      Room #: 20 [facility] Age: 64 y.o.        Sex: male Referring Physician: Dr Chase Caller Report Date:  06/28/2015        Interpreting Physician: Hulen Luster  History: DARRELD HOFFER is an 64 y.o. male hx of seizures admitted with breakthrough seizures  Medications:  Continuous: . dextrose 5 % and 0.9% NaCl 75 mL (06/28/15 0120)  . fentaNYL infusion INTRAVENOUS 300 mcg/hr (06/28/15 1040)  . phenylephrine (NEO-SYNEPHRINE) Adult infusion Stopped (06/28/15 1038)  . propofol (DIPRIVAN) infusion 40 mcg/kg/min (06/28/15 1120)    Conditions of Recording:  This is a 16 channel EEG carried out with the patient in the intubated and sedated state.   Description:  The background activity consists of a low voltage, symmetrical,poorly organized theta activity with occasional delta activity. No posterior dominant alpha rhythm is noted. No focal slowing or epileptiform activity is noted.    Hyperventilation was not performed. Intermittent photic stimulation was not performed.   IMPRESSION: Abnormal EEG due to the presence of generalized slowing indicating a moderate cerebral disturbance (encephalopathy). No epileptiform activity was noted.    Jim Like, DO [facility] [phone]  If 7pm- 7am, please page neurology on call as listed in [place]. 06/28/2015, 11:09 AM

## 2015-06-28 NOTE — Progress Notes (Signed)
Initial Nutrition Assessment  INTERVENTION:   If patient is to remain intubated for > 48 hours, recommend nutrition support.  TF recommendations: Initiate Vital AF 1.2 @ 20 ml/hr and increase by 10 ml every 4 hours to goal rate of 55 ml/hr.   Tube feeding regimen provides 1584 kcal (95% of needs), 99 grams of protein, and 1071 ml of H2O.   RD to continue to monitor for plan  NUTRITION DIAGNOSIS:   Inadequate oral intake related to inability to eat as evidenced by NPO status.  GOAL:   Patient will meet greater than or equal to 90% of their needs  MONITOR:   Vent status, Labs, Weight trends, Skin, I & O's  REASON FOR ASSESSMENT:   Ventilator    ASSESSMENT:   64 year old male with history of seizure presented to [facility] ED 10/23 after having 3 witnessed tonic-clonic seizures at group home where he resides. In ED he required intubation for airway protection.   No family present in room. EEG in progress.  Will complete nutrition focused physical exam at follow-up.  Patient is currently intubated on ventilator support MV: 10.8 L/min Temp (24hrs), Avg:99.8 F (37.7 C), Min:98.1 F (36.7 C), Max:102.2 F (39 C)  Propofol: 16.9 ml/hr -> provides 446 fat kcal  Labs reviewed: Low K  Diet Order:  Diet NPO time specified  Skin:  Reviewed, no issues  Last BM:  PTA  Height:   Ht Readings from Last 1 Encounters:  06/27/15 6' (1.829 m)    Weight:   Wt Readings from Last 1 Encounters:  06/27/15 143 lb 1.3 oz (64.9 kg)    Ideal Body Weight:  80.9 kg  BMI:  Body mass index is 19.4 kg/(m^2).  Estimated Nutritional Needs:   Kcal:  1188  Protein:  95-105g  Fluid:  1.7L/day  EDUCATION NEEDS:   No education needs identified at this time  Clayton Bibles, MS, RD, LDN Pager: [phone] After Hours Pager: [phone]

## 2015-06-28 NOTE — Progress Notes (Signed)
EEG Completed; Results Pending  

## 2015-06-28 NOTE — Progress Notes (Signed)
[place] Progress Note Patient Name: Marco [name] DOB: 1950/09/12 MRN: [id number]   Date of Service  06/28/2015  HPI/Events of Note  Prop off remains agitated  eICU Interventions  Versed prn     Intervention Category Major Interventions: Change in mental status - evaluation and management  Raylene Miyamoto. 06/28/2015, 9:34 PM

## 2015-06-28 NOTE — Progress Notes (Signed)
[place] Progress Note Patient Name: DEBORAH DONDERO DOB: 13-Dec-1950 MRN: [id number]   Date of Service  06/28/2015  HPI/Events of Note  Hypotension - BP = 74/53. No central venous line.   eICU Interventions  Will order: 1. Phenylephrine IV infusion. Titrate to MAP >= 65.     Intervention Category Major Interventions: Hypotension - evaluation and management  Yoshi Vicencio Eugene 06/28/2015, 2:34 AM

## 2015-06-28 NOTE — Progress Notes (Signed)
[place] Progress Note Patient Name: Marco [name] DOB: June 08, 1951 MRN: [id number]   Date of Service  06/28/2015  HPI/Events of Note  Hypoglycemia - Blood glucose = 61.   eICU Interventions  Will order: 1. D50 1/2 amp IV now.  2. D5 0.9 NaCl to run IV at 75 mL/hour. 3. Accuchecks Q 1 hour until blood glucose > 100 X 2, then Q 4 hours.     Intervention Category Major Interventions: Other:  Khalil Szczepanik Cornelia Copa 06/28/2015, 1:13 AM

## 2015-06-28 NOTE — Progress Notes (Signed)
Pt seen and examined today. See the attestation to the consult note.  Marshell Garfinkel MD [place] Pulmonary and Critical [facility] Pager [phone] If no answer or after 3pm call: [phone] 06/28/2015, 10:28 AM

## 2015-06-28 NOTE — Progress Notes (Signed)
Hypoglycemic Event  CBG: 63  Treatment: D50 IV 25 mL  Symptoms: None  Follow-up CBG: Time:1245  CBG Result:85  Possible Reasons for Event: Inadequate meal intake  Comments/MD notified:    Casimer Bilis

## 2015-06-28 NOTE — Progress Notes (Signed)
[place] Progress Note Patient Name: Marco [name] DOB: 06/28/1951 MRN: [id number]   Date of Service  06/28/2015  HPI/Events of Note  Hypotension - BP = 88/56.  eICU Interventions  Will bolus with 0.9 NaCl 1 liter IV over 1 hour now.     Intervention Category Intermediate Interventions: Hypotension - evaluation and management  Sommer,Steven Eugene 06/28/2015, 1:01 AM

## 2015-06-28 NOTE — Progress Notes (Signed)
Subjective: No further seizure activity. Spiked fever overnight but currently afebrile this morning. Hypotensive overnight. Remains on sedation.  EEG pending.  Objective: Current vital signs: BP 91/65 mmHg  Pulse 76  Temp(Src) 98.1 F (36.7 C) (Oral)  Resp 20  Ht 6' (1.829 m)  Wt 64.9 kg (143 lb 1.3 oz)  BMI 19.40 kg/m2  SpO2 100% Vital signs in last 24 hours: Temp:  [98.1 F (36.7 C)-102.2 F (39 C)] 98.1 F (36.7 C) (10/24 0800) Pulse Rate:  [51-126] 76 (10/24 0800) Resp:  [16-30] 20 (10/24 0800) BP: (71-140)/(40-117) 91/65 mmHg (10/24 0800) SpO2:  [98 %-100 %] 100 % (10/24 0800) FiO2 (%):  [30 %-100 %] 30 % (10/24 0803) Weight:  [62.5 kg (137 lb 12.6 oz)-64.9 kg (143 lb 1.3 oz)] 64.9 kg (143 lb 1.3 oz) (10/23 1916)  Intake/Output from previous day: 10/23 0701 - 10/24 0700 In: 1645.2 [I.V.:1035.2; IV Piggyback:610] Out: 7353 [Urine:605; Emesis/NG output:600] Intake/Output this shift: Total I/O In: -  Out: 350 [Urine:350] Nutritional status: Diet NPO time specified  Neurologic Exam: Mental Status: on propofol, recent dose of versed given Eyes open but will not fixate on examiner.  Non-verbal. Intermittent purposeful movement (will grab for ET tube, localizes to noxious stimuli). Not following commands.  Cranial Nerves: II: pupils equal round, reactive to light III,IV, VI: ptosis not present, eyes midline, no gaze deviation V,VII: face symmetric,  Motor: Spontaneously moves all extremities symmetrically and against light resistance Sensory: withdrawals to noxious stimuli in all extremities  Lab Results: Basic Metabolic Panel:  Recent Labs Lab 06/22/15 0933 06/27/15 1533 06/28/15 0327  NA 143 140 141  K 3.6 4.4 3.1*  CL  --  107 112*  CO2 28 16* 23  GLUCOSE 101 188* 125*  BUN 11.'4 15 14  '$ CREATININE 1.0 1.38* 1.15  CALCIUM 9.1 9.0 7.9*    Liver Function Tests:  Recent Labs Lab 06/22/15 0933 06/27/15 1536  AST 8 31  ALT <9 10*  ALKPHOS 75 68   BILITOT 0.33 0.6  PROT 7.1 7.4  ALBUMIN 3.7 4.0   No results for input(s): LIPASE, AMYLASE in the last 168 hours. No results for input(s): AMMONIA in the last 168 hours.  CBC:  Recent Labs Lab 06/22/15 0933 06/27/15 1533 06/28/15 0327  WBC 6.6 11.3* 11.0*  NEUTROABS 4.8 8.0*  --   HGB 13.8 13.9 12.1*  HCT 42.8 44.2 36.5*  MCV 86.3 90.6 87.1  PLT 185 226 185    Cardiac Enzymes:  Recent Labs Lab 06/27/15 1533 06/27/15 2058 06/27/15 2323  CKTOTAL  --  221  --   TROPONINI <0.03  --  <0.03    Lipid Panel: No results for input(s): CHOL, TRIG, HDL, CHOLHDL, VLDL, LDLCALC in the last 168 hours.  CBG:  Recent Labs Lab 06/28/15 0101 06/28/15 0203 06/28/15 0355 06/28/15 0805  GLUCAP 61* 92  92 126* 34    Microbiology: Results for orders placed or performed during the hospital encounter of 06/27/15  MRSA PCR Screening     Status: None   Collection Time: 06/27/15  7:00 PM  Result Value Ref Range Status   MRSA by PCR NEGATIVE NEGATIVE Final    Comment:        The GeneXpert MRSA Assay (FDA approved for NASAL specimens only), is one component of a comprehensive MRSA colonization surveillance program. It is not intended to diagnose MRSA infection nor to guide or monitor treatment for MRSA infections.     Coagulation Studies:  Recent Labs  06/27/15 1533  LABPROT 15.8*  INR 1.25    Imaging: Ct Head Wo Contrast  06/27/2015  CLINICAL DATA:  Seizure. EXAM: CT HEAD WITHOUT CONTRAST CT CERVICAL SPINE WITHOUT CONTRAST TECHNIQUE: Multidetector CT imaging of the head and cervical spine was performed following the standard protocol without intravenous contrast. Multiplanar CT image reconstructions of the cervical spine were also generated. COMPARISON:  CT scan of head of April 17, 2015. FINDINGS: CT HEAD FINDINGS Bony calvarium appears intact. Mild diffuse cortical atrophy is noted. No mass effect or midline shift is noted. Ventricular size is within normal  limits. There is no evidence of mass lesion, hemorrhage or acute infarction. CT CERVICAL SPINE FINDINGS No fracture is noted. Grade 1 retrolisthesis of C3-4 is noted secondary to degenerative disc disease at this level. Anterior osteophyte formation is also noted at C4-5, C5-6 and C6-7. Mild degenerative disc disease is noted at C6-7. Posterior facet joints are unremarkable. IMPRESSION: Mild diffuse cortical atrophy. No acute intracranial abnormality seen. Multilevel degenerative disc disease. No acute abnormality seen in the cervical spine. Electronically Signed   By: Marijo Conception, M.D.   On: 06/27/2015 17:37   Ct Cervical Spine Wo Contrast  06/27/2015  CLINICAL DATA:  Seizure. EXAM: CT HEAD WITHOUT CONTRAST CT CERVICAL SPINE WITHOUT CONTRAST TECHNIQUE: Multidetector CT imaging of the head and cervical spine was performed following the standard protocol without intravenous contrast. Multiplanar CT image reconstructions of the cervical spine were also generated. COMPARISON:  CT scan of head of April 17, 2015. FINDINGS: CT HEAD FINDINGS Bony calvarium appears intact. Mild diffuse cortical atrophy is noted. No mass effect or midline shift is noted. Ventricular size is within normal limits. There is no evidence of mass lesion, hemorrhage or acute infarction. CT CERVICAL SPINE FINDINGS No fracture is noted. Grade 1 retrolisthesis of C3-4 is noted secondary to degenerative disc disease at this level. Anterior osteophyte formation is also noted at C4-5, C5-6 and C6-7. Mild degenerative disc disease is noted at C6-7. Posterior facet joints are unremarkable. IMPRESSION: Mild diffuse cortical atrophy. No acute intracranial abnormality seen. Multilevel degenerative disc disease. No acute abnormality seen in the cervical spine. Electronically Signed   By: Marijo Conception, M.D.   On: 06/27/2015 17:37   Dg Chest Portable 1 View  06/27/2015  CLINICAL DATA:  Endotracheal tube placement.  Seizures. EXAM: PORTABLE CHEST  1 VIEW COMPARISON:  April 17, 2015. FINDINGS: Stable cardiomediastinal silhouette. Endotracheal tube is seen projected over tracheal air shadow with distal tip approximately 2 cm above the carina. No pneumothorax is noted. Left upper lobe mass is significantly smaller compared to prior exam. Bony thorax is unremarkable. IMPRESSION: Endotracheal tube in grossly good position. Left upper lobe mass is significantly smaller compared to prior exam. Electronically Signed   By: Marijo Conception, M.D.   On: 06/27/2015 15:55    Medications:  Scheduled: . antiseptic oral rinse  7 mL Mouth Rinse QID  . chlorhexidine gluconate  15 mL Mouth Rinse BID  . enoxaparin (LOVENOX) injection  40 mg Subcutaneous Q24H  . famotidine (PEPCID) IV  20 mg Intravenous Q12H  . lacosamide (VIMPAT) IV  100 mg Intravenous Q12H  . levETIRAcetam  1,500 mg Intravenous Q12H  . midazolam  2 mg Intravenous Once  . piperacillin-tazobactam (ZOSYN)  IV  3.375 g Intravenous Q8H  . vancomycin  500 mg Intravenous Q12H    Assessment/Plan:  64y/o hx of seizures, developmental disability, lung CA presenting with breakthrough seizures. Intubated in ED for airway  protection. Unclear etiology of breakthrough seizures. Spiked fever overnight but currently afebrile. EEG pending   -continue keppra '1500mg'$  BID -added vimpat '100mg'$  BID -seizure precautions -EEG pending -will continue to follow     LOS: 1 day   Jim Like, DO [facility] [phone]  If 7pm- 7am, please page neurology on call as listed in [place]. 06/28/2015  10:06 AM

## 2015-06-29 ENCOUNTER — Inpatient Hospital Stay (HOSPITAL_COMMUNITY): Payer: Medicare Other

## 2015-06-29 DIAGNOSIS — G40909 Epilepsy, unspecified, not intractable, without status epilepticus: Secondary | ICD-10-CM | POA: Diagnosis not present

## 2015-06-29 DIAGNOSIS — G40901 Epilepsy, unspecified, not intractable, with status epilepticus: Secondary | ICD-10-CM | POA: Diagnosis not present

## 2015-06-29 DIAGNOSIS — J9600 Acute respiratory failure, unspecified whether with hypoxia or hypercapnia: Secondary | ICD-10-CM

## 2015-06-29 LAB — GLUCOSE, CAPILLARY
Glucose-Capillary: 70 mg/dL (ref 65–99)
Glucose-Capillary: 106 mg/dL — ABNORMAL HIGH (ref 65–99)
Glucose-Capillary: 92 mg/dL (ref 65–99)
Glucose-Capillary: 99 mg/dL (ref 65–99)
Glucose-Capillary: 79 mg/dL (ref 65–99)

## 2015-06-29 LAB — BASIC METABOLIC PANEL
Anion gap: 6 (ref 5–15)
BUN: 8 mg/dL (ref 6–20)
CHLORIDE: 116 mmol/L — AB (ref 101–111)
CO2: 24 mmol/L (ref 22–32)
CREATININE: 1.1 mg/dL (ref 0.61–1.24)
Calcium: 7.9 mg/dL — ABNORMAL LOW (ref 8.9–10.3)
GFR calc Af Amer: >60 mL/min (ref >60)
GFR calc non Af Amer: >60 mL/min (ref >60)
GLUCOSE: 104 mg/dL — AB (ref 65–99)
POTASSIUM: 3.2 mmol/L — AB (ref 3.5–5.1)
SODIUM: 146 mmol/L — AB (ref 135–145)

## 2015-06-29 LAB — CBC
HCT: 37.6 % — ABNORMAL LOW (ref 39.0–52.0)
HEMOGLOBIN: 11.8 g/dL — AB (ref 13.0–17.0)
MCH: 28.3 pg (ref 26.0–34.0)
MCHC: 31.4 g/dL (ref 30.0–36.0)
MCV: 90.2 fL (ref 78.0–100.0)
PLATELETS: 148 10*3/uL — AB (ref 150–400)
RBC: 4.17 MIL/uL — ABNORMAL LOW (ref 4.22–5.81)
RDW: 15.5 % (ref 11.5–15.5)
WBC: 8.9 10*3/uL (ref 4.0–10.5)

## 2015-06-29 LAB — CSF CELL COUNT WITH DIFFERENTIAL
RBC Count, CSF: 2 /mm3 — ABNORMAL HIGH
RBC Count, CSF: 9 /mm3 — ABNORMAL HIGH
Tube #: 1
Tube #: 4
WBC CSF: 3 /mm3 (ref 0–5)
WBC, CSF: 2 /mm3 (ref 0–5)

## 2015-06-29 LAB — BLOOD GAS, ARTERIAL
Acid-base deficit: 3.9 mmol/L — ABNORMAL HIGH (ref 0.0–2.0)
BICARBONATE: 20.6 meq/L (ref 20.0–24.0)
Drawn by: 398981
FIO2: 0.3
O2 SAT: 97.9 %
PATIENT TEMPERATURE: 37.7
PCO2 ART: 39.2 mmHg (ref 35.0–45.0)
PEEP/CPAP: 5 cmH2O
PH ART: 7.345 — AB (ref 7.350–7.450)
PO2 ART: 115 mmHg — AB (ref 80.0–100.0)
RATE: 20 resp/min
TCO2: 19 mmol/L (ref 0–100)
VT: 460 mL

## 2015-06-29 LAB — PROTEIN AND GLUCOSE, CSF
GLUCOSE CSF: 72 mg/dL — AB (ref 40–70)
TOTAL PROTEIN, CSF: 28 mg/dL (ref 15–45)

## 2015-06-29 LAB — MAGNESIUM: MAGNESIUM: 1.8 mg/dL (ref 1.7–2.4)

## 2015-06-29 LAB — PHOSPHORUS: Phosphorus: 2.5 mg/dL (ref 2.5–4.6)

## 2015-06-29 MED ORDER — MAGNESIUM SULFATE 2 GM/50ML IV SOLN
2.0000 g | Freq: Once | INTRAVENOUS | Status: AC
  Administered 2015-06-29: 2 g via INTRAVENOUS
  Filled 2015-06-29: qty 50

## 2015-06-29 MED ORDER — DONEPEZIL HCL 10 MG PO TABS
10.0000 mg | ORAL_TABLET | Freq: Every day | ORAL | Status: DC
  Administered 2015-06-29 – 2015-07-04 (×5): 10 mg via ORAL
  Filled 2015-06-29 (×6): qty 1

## 2015-06-29 MED ORDER — ACETAMINOPHEN 160 MG/5ML PO SOLN
650.0000 mg | Freq: Four times a day (QID) | ORAL | Status: DC | PRN
  Administered 2015-06-29 (×2): 650 mg
  Filled 2015-06-29 (×2): qty 20.3

## 2015-06-29 MED ORDER — POTASSIUM CHLORIDE 10 MEQ/100ML IV SOLN
10.0000 meq | INTRAVENOUS | Status: AC
  Administered 2015-06-29 (×4): 10 meq via INTRAVENOUS
  Filled 2015-06-29 (×2): qty 100

## 2015-06-29 MED ORDER — ACYCLOVIR SODIUM 50 MG/ML IV SOLN
10.0000 mg/kg | Freq: Three times a day (TID) | INTRAVENOUS | Status: DC
  Administered 2015-06-29 – 2015-06-30 (×3): 700 mg via INTRAVENOUS
  Filled 2015-06-29 (×4): qty 14

## 2015-06-29 MED ORDER — RISPERIDONE 3 MG PO TABS
3.0000 mg | ORAL_TABLET | Freq: Every day | ORAL | Status: DC
  Administered 2015-06-29 – 2015-07-04 (×5): 3 mg via ORAL
  Filled 2015-06-29 (×8): qty 1

## 2015-06-29 NOTE — Progress Notes (Signed)
ANTIBIOTIC CONSULT NOTE - FOLLOW UP  Pharmacy Consult for Acyclovir Indication: r/o CNS infection  No Known Allergies  Patient Measurements: Height: 6' (182.9 cm) Weight: 154 lb 8.7 oz (70.1 kg) IBW/kg (Calculated) : 77.6  Vital Signs: Temp: 99 F (37.2 C) (10/25 1100) Temp Source: Core (Comment) (10/25 0400) BP: 106/56 mmHg (10/25 1100) Pulse Rate: 75 (10/25 1100) Intake/Output from previous day: 10/24 0701 - 10/25 0700 In: 4593.7 [I.V.:3743.7; NG/GT:30; IV Piggyback:820] Out: 2665 [Urine:2115; Emesis/NG output:550] Intake/Output from this shift: Total I/O In: 977.5 [I.V.:340; NG/GT:90; IV Piggyback:547.5] Out: 150 [Urine:150]  Labs:  Recent Labs  06/27/15 1533 06/28/15 0327 06/29/15 0353  WBC 11.3* 11.0* 8.9  HGB 13.9 12.1* 11.8*  PLT 226 185 148*  CREATININE 1.38* 1.15 1.10   Estimated Creatinine Clearance: 65.5 mL/min (by C-G formula based on Cr of 1.1). No results for input(s): VANCOTROUGH, VANCOPEAK, VANCORANDOM, GENTTROUGH, GENTPEAK, GENTRANDOM, TOBRATROUGH, TOBRAPEAK, TOBRARND, AMIKACINPEAK, AMIKACINTROU, AMIKACIN in the last 72 hours.   Microbiology: Recent Results (from the past 720 hour(s))  MRSA PCR Screening     Status: None   Collection Time: 06/27/15  7:00 PM  Result Value Ref Range Status   MRSA by PCR NEGATIVE NEGATIVE Final    Comment:        The GeneXpert MRSA Assay (FDA approved for NASAL specimens only), is one component of a comprehensive MRSA colonization surveillance program. It is not intended to diagnose MRSA infection nor to guide or monitor treatment for MRSA infections.   CSF culture     Status: None (Preliminary result)   Collection Time: 06/29/15 10:46 AM  Result Value Ref Range Status   Specimen Description CSF  Final   Special Requests Normal  Final   Gram Stain   Final    CYTOSPIN WBC PRESENT, PREDOMINANTLY PMN NO ORGANISMS SEEN Gram Stain Report Called to,Read Back By and Verified With: ARNOLD RN AT 1140 ON  10.25.16 BY SHUEA    Culture PENDING  Incomplete   Report Status PENDING  Incomplete    Anti-infectives    Start     Dose/Rate Route Frequency Ordered Stop   06/29/15 1230  acyclovir (ZOVIRAX) 700 mg in dextrose 5 % 150 mL IVPB     10 mg/kg  70.1 kg 164 mL/hr over 60 Minutes Intravenous 3 times per day 06/29/15 1152     06/28/15 1000  vancomycin (VANCOCIN) 500 mg in sodium chloride 0.9 % 100 mL IVPB     500 mg 100 mL/hr over 60 Minutes Intravenous Every 12 hours 06/27/15 2347     06/28/15 0800  piperacillin-tazobactam (ZOSYN) IVPB 3.375 g     3.375 g 12.5 mL/hr over 240 Minutes Intravenous Every 8 hours 06/27/15 2347     06/27/15 2300  piperacillin-tazobactam (ZOSYN) IVPB 3.375 g     3.375 g 100 mL/hr over 30 Minutes Intravenous  Once 06/27/15 2257 06/27/15 2342   06/27/15 2300  vancomycin (VANCOCIN) IVPB 1000 mg/200 mL premix     1,000 mg 200 mL/hr over 60 Minutes Intravenous  Once 06/27/15 2257 06/28/15 0030      Assessment: 64 y.o. male known to pharmacy from [place].  Admitted from group home with breakthrough seizures.  No further seizure activity but continues to spike fevers.  LP obtained and to add Acyclovir per pharmacy to r/o CNS infection  10/23 >>zosyn >>  10/23 >>vancomycin >>  10/25 >> acyclovir IV >>   10/23 blood: NGTD 10/25 LP (after vanc/Zosyn): clear on Gm stain  Temp: 100.4,  now on APAP  WBC: mildly elevated  Renal: AKI resolving; CrCl 58 CG   Goal of Therapy:  Vancomycin trough level 15-20 mcg/ml  Eradication of infection Appropriate antibiotic dosing for indication and renal function  Plan:  Day 3 antibiotics  Continue vanc/Zosyn as ordered  Begin Acyclovir 700 mg (10 mg/kg IBW) q8 hr  Ensure adequate hydration continues while on acyclovir  Follow clinical course, renal function, culture results as available  Follow for de-escalation of antibiotics and LOT   Reuel Boom, PharmD, BCPS Pager: [phone] 06/29/2015, 12:05  PM

## 2015-06-29 NOTE — Progress Notes (Signed)
[facility] ADULT ICU REPLACEMENT PROTOCOL FOR AM LAB REPLACEMENT ONLY  The patient does apply for the [facility] Adult ICU Electrolyte Replacment Protocol based on the criteria listed below:   1. Is GFR >/= 40 ml/min? Yes.    Patient's GFR today is >60 2. Is urine output >/= 0.5 ml/kg/hr for the last 6 hours? Yes.   Patient's UOP is 0.85 ml/kg/hr 3. Is BUN < 60 mg/dL? Yes.    Patient's BUN today is 8 4. Abnormal electrolyte  K 3.2 5. Ordered repletion with: per protocol 6. If a panic level lab has been reported, has the CCM MD in charge been notified? Yes.  .   Physician:  Philbert Riser 06/29/2015 5:35 AM

## 2015-06-29 NOTE — Progress Notes (Addendum)
RN called per patient talking around ETT. ETT found to be around 20 at the lip. Patient was not receiving appropriate tidal volumes and cuff was not full inflated. ETT pushed back to 22 at top lip, RN at bedside. Cuff inflated and patient is getting tidal volumes. RN calling for portable CXR for ETT placement.

## 2015-06-29 NOTE — Progress Notes (Signed)
Subjective: No further seizure activity. Spiked fever again overnight. Hypotensive overnight. Off sedation, mental status slightly improved this morning.   EEG completed shows generalized slowing, no seizure activity noted.  Objective: Current vital signs: BP 98/65 mmHg  Pulse 83  Temp(Src) 99.9 F (37.7 C) (Core (Comment))  Resp 21  Ht 6' (1.829 m)  Wt 70.1 kg (154 lb 8.7 oz)  BMI 20.96 kg/m2  SpO2 100% Vital signs in last 24 hours: Temp:  [99 F (37.2 C)-101.1 F (38.4 C)] 99.9 F (37.7 C) (10/25 1000) Pulse Rate:  [55-104] 83 (10/25 1000) Resp:  [15-25] 21 (10/25 1000) BP: (70-140)/(40-82) 98/65 mmHg (10/25 1000) SpO2:  [97 %-100 %] 100 % (10/25 1000) FiO2 (%):  [30 %] 30 % (10/25 0757) Weight:  [70.1 kg (154 lb 8.7 oz)] 70.1 kg (154 lb 8.7 oz) (10/25 0300)  Intake/Output from previous day: 10/24 0701 - 10/25 0700 In: 4593.7 [I.V.:3743.7; NG/GT:30; IV Piggyback:820] Out: 2665 [Urine:2115; Emesis/NG output:550] Intake/Output this shift: Total I/O In: 815 [I.V.:190; NG/GT:90; IV Piggyback:535] Out: 150 [Urine:150] Nutritional status: Diet NPO time specified  Neurologic Exam: Mental Status:  Eyes open will fixate on examiner.  Non-verbal. Follows simple commands Cranial Nerves: II: pupils equal round, reactive to light III,IV, VI: ptosis not present, eyes midline, no gaze deviation V,VII: face symmetric,  Motor: Spontaneously moves all extremities symmetrically and against light resistance Sensory: withdrawals to noxious stimuli in all extremities  Lab Results: Basic Metabolic Panel:  Recent Labs Lab 06/27/15 1533 06/28/15 0327 06/29/15 0353  NA 140 141 146*  K 4.4 3.1* 3.2*  CL 107 112* 116*  CO2 16* 23 24  GLUCOSE 188* 125* 104*  BUN '15 14 8  '$ CREATININE 1.38* 1.15 1.10  CALCIUM 9.0 7.9* 7.9*  MG  --   --  1.8  PHOS  --   --  2.5    Liver Function Tests:  Recent Labs Lab 06/27/15 1536  AST 31  ALT 10*  ALKPHOS 68  BILITOT 0.6  PROT 7.4   ALBUMIN 4.0   No results for input(s): LIPASE, AMYLASE in the last 168 hours. No results for input(s): AMMONIA in the last 168 hours.  CBC:  Recent Labs Lab 06/27/15 1533 06/28/15 0327 06/29/15 0353  WBC 11.3* 11.0* 8.9  NEUTROABS 8.0*  --   --   HGB 13.9 12.1* 11.8*  HCT 44.2 36.5* 37.6*  MCV 90.6 87.1 90.2  PLT 226 185 148*    Cardiac Enzymes:  Recent Labs Lab 06/27/15 1533 06/27/15 2058 06/27/15 2323  CKTOTAL  --  221  --   TROPONINI <0.03  --  <0.03    Lipid Panel: No results for input(s): CHOL, TRIG, HDL, CHOLHDL, VLDL, LDLCALC in the last 168 hours.  CBG:  Recent Labs Lab 06/28/15 1529 06/28/15 2011 06/28/15 2340 06/29/15 0328 06/29/15 0740  GLUCAP 72 86 70 106* 92    Microbiology: Results for orders placed or performed during the hospital encounter of 06/27/15  MRSA PCR Screening     Status: None   Collection Time: 06/27/15  7:00 PM  Result Value Ref Range Status   MRSA by PCR NEGATIVE NEGATIVE Final    Comment:        The GeneXpert MRSA Assay (FDA approved for NASAL specimens only), is one component of a comprehensive MRSA colonization surveillance program. It is not intended to diagnose MRSA infection nor to guide or monitor treatment for MRSA infections.     Coagulation Studies:  Recent Labs  06/27/15  1533  LABPROT 15.8*  INR 1.25    Imaging: Ct Head Wo Contrast  06/27/2015  CLINICAL DATA:  Seizure. EXAM: CT HEAD WITHOUT CONTRAST CT CERVICAL SPINE WITHOUT CONTRAST TECHNIQUE: Multidetector CT imaging of the head and cervical spine was performed following the standard protocol without intravenous contrast. Multiplanar CT image reconstructions of the cervical spine were also generated. COMPARISON:  CT scan of head of April 17, 2015. FINDINGS: CT HEAD FINDINGS Bony calvarium appears intact. Mild diffuse cortical atrophy is noted. No mass effect or midline shift is noted. Ventricular size is within normal limits. There is no  evidence of mass lesion, hemorrhage or acute infarction. CT CERVICAL SPINE FINDINGS No fracture is noted. Grade 1 retrolisthesis of C3-4 is noted secondary to degenerative disc disease at this level. Anterior osteophyte formation is also noted at C4-5, C5-6 and C6-7. Mild degenerative disc disease is noted at C6-7. Posterior facet joints are unremarkable. IMPRESSION: Mild diffuse cortical atrophy. No acute intracranial abnormality seen. Multilevel degenerative disc disease. No acute abnormality seen in the cervical spine. Electronically Signed   By: Marijo Conception, M.D.   On: 06/27/2015 17:37   Ct Cervical Spine Wo Contrast  06/27/2015  CLINICAL DATA:  Seizure. EXAM: CT HEAD WITHOUT CONTRAST CT CERVICAL SPINE WITHOUT CONTRAST TECHNIQUE: Multidetector CT imaging of the head and cervical spine was performed following the standard protocol without intravenous contrast. Multiplanar CT image reconstructions of the cervical spine were also generated. COMPARISON:  CT scan of head of April 17, 2015. FINDINGS: CT HEAD FINDINGS Bony calvarium appears intact. Mild diffuse cortical atrophy is noted. No mass effect or midline shift is noted. Ventricular size is within normal limits. There is no evidence of mass lesion, hemorrhage or acute infarction. CT CERVICAL SPINE FINDINGS No fracture is noted. Grade 1 retrolisthesis of C3-4 is noted secondary to degenerative disc disease at this level. Anterior osteophyte formation is also noted at C4-5, C5-6 and C6-7. Mild degenerative disc disease is noted at C6-7. Posterior facet joints are unremarkable. IMPRESSION: Mild diffuse cortical atrophy. No acute intracranial abnormality seen. Multilevel degenerative disc disease. No acute abnormality seen in the cervical spine. Electronically Signed   By: Marijo Conception, M.D.   On: 06/27/2015 17:37   Dg Chest Port 1 View  06/29/2015  CLINICAL DATA:  Respiratory failure, endotracheal tube position. EXAM: PORTABLE CHEST 1 VIEW  COMPARISON:  06/27/2015 and CT chest 04/21/2015. FINDINGS: Endotracheal tube terminates 4.4 cm above the carina. Nasogastric tube terminates in the stomach. Heart size stable. Left upper lobe masslike consolidation, hilar retraction and apical opacification appear grossly stable. Mild right basilar airspace opacification. Tiny left pleural effusion or pleural thickening. Right apical pleural thickening. IMPRESSION: 1. Left upper lobe mass and associated treatment changes in the apical left hemi thorax. 2. Bibasilar airspace opacification, possibly due to an infectious bronchiolitis or bronchopneumonia. Electronically Signed   By: Lorin Picket M.D.   On: 06/29/2015 07:20   Dg Chest Portable 1 View  06/27/2015  CLINICAL DATA:  Endotracheal tube placement.  Seizures. EXAM: PORTABLE CHEST 1 VIEW COMPARISON:  April 17, 2015. FINDINGS: Stable cardiomediastinal silhouette. Endotracheal tube is seen projected over tracheal air shadow with distal tip approximately 2 cm above the carina. No pneumothorax is noted. Left upper lobe mass is significantly smaller compared to prior exam. Bony thorax is unremarkable. IMPRESSION: Endotracheal tube in grossly good position. Left upper lobe mass is significantly smaller compared to prior exam. Electronically Signed   By: Marijo Conception, M.D.  On: 06/27/2015 15:55    Medications:  Scheduled: . antiseptic oral rinse  7 mL Mouth Rinse QID  . chlorhexidine gluconate  15 mL Mouth Rinse BID  . donepezil  10 mg Oral QHS  . enoxaparin (LOVENOX) injection  40 mg Subcutaneous Q24H  . famotidine (PEPCID) IV  20 mg Intravenous Q12H  . lacosamide (VIMPAT) IV  100 mg Intravenous Q12H  . levETIRAcetam  1,500 mg Intravenous Q12H  . piperacillin-tazobactam (ZOSYN)  IV  3.375 g Intravenous Q8H  . risperiDONE  3 mg Oral QHS  . vancomycin  500 mg Intravenous Q12H    Assessment/Plan:  64y/o hx of seizures, developmental disability, lung CA presenting with breakthrough seizures.  Intubated in ED for airway protection. Unclear etiology of breakthrough seizures. Continues to spike fevers with unclear source. Will plan for LP to rule out CNS infection.   -continue keppra '1500mg'$  BID -added vimpat '100mg'$  BID -LP results pending -agree with empiric acyclovir -will continue to follow     LOS: 2 days   Jim Like, DO [facility] [phone]  If 7pm- 7am, please page neurology on call as listed in [place]. 06/29/2015  10:56 AM

## 2015-06-29 NOTE — Progress Notes (Signed)
PULMONARY / CRITICAL CARE MEDICINE   Name: Marco [name] MRN: [id number] DOB: 03-26-1951    ADMISSION DATE:  06/27/2015 CONSULTATION DATE:  06/27/2015  REFERRING MD :  EDP  CHIEF COMPLAINT:  Seizure  INITIAL PRESENTATION: 64 year old male with history of lung cancer and seizures who presented [facility] ED 10/23 after having 3 witnessed tonic-clonic seizures at group home where he resides. In ED he required intubation for airway protection. PCCM to admit with neurology consultation   STUDIES:  CT head 10/23 > Mild diffuse cortical atrophy. No acute intracranial abnormality seen. Multilevel degenerative disc disease. No acute abnormality seen in the cervical spine.  SIGNIFICANT EVENTS: 10/23  Admit to ICU on ventilator 10/25  Fever tmax 101.1, failed SBT due to sedation     SUBJECTIVE:  No acute events overnight.  RN reports fentanyl reduced from 400 mcg to 100 this am.  Pt sedate, failed SBT due to poor effort.    VITAL SIGNS: Temp:  [99 F (37.2 C)-101.1 F (38.4 C)] 99.9 F (37.7 C) (10/25 1000) Pulse Rate:  [55-104] 83 (10/25 1000) Resp:  [15-25] 21 (10/25 1000) BP: (70-140)/(40-82) 98/65 mmHg (10/25 1000) SpO2:  [97 %-100 %] 100 % (10/25 1000) FiO2 (%):  [30 %] 30 % (10/25 0757) Weight:  [154 lb 8.7 oz (70.1 kg)] 154 lb 8.7 oz (70.1 kg) (10/25 0300) HEMODYNAMICS:   VENTILATOR SETTINGS: Vent Mode:  [-] PRVC FiO2 (%):  [30 %] 30 % Set Rate:  [20 bmp] 20 bmp Vt Set:  [460 mL] 460 mL PEEP:  [5 cmH20] 5 cmH20 Plateau Pressure:  [15 cmH20-21 cmH20] 20 cmH20 INTAKE / OUTPUT:  Intake/Output Summary (Last 24 hours) at 06/29/15 1016 Last data filed at 06/29/15 0910  Gross per 24 hour  Intake 4391.94 ml  Output   2315 ml  Net 2076.94 ml    PHYSICAL EXAMINATION: General:  Male of normal body habitus in no acute distress on ventilator Neuro:  Sedate on vent, nods appropriately, MAE HEENT:  [place]/AT, PERRL, no appreciable JVD Cardiovascular:  RRR, no MRG Lungs:  Clear  bilateral breath sounds Abdomen:  Soft nondistended Musculoskeletal:  No acute deformity Skin:  Grossly intact  LABS:  CBC  Recent Labs Lab 06/27/15 1533 06/28/15 0327 06/29/15 0353  WBC 11.3* 11.0* 8.9  HGB 13.9 12.1* 11.8*  HCT 44.2 36.5* 37.6*  PLT 226 185 148*   Coag's  Recent Labs Lab 06/27/15 1533 06/27/15 2323  APTT  --  29  INR 1.25  --    BMET  Recent Labs Lab 06/27/15 1533 06/28/15 0327 06/29/15 0353  NA 140 141 146*  K 4.4 3.1* 3.2*  CL 107 112* 116*  CO2 16* 23 24  BUN '15 14 8  '$ CREATININE 1.38* 1.15 1.10  GLUCOSE 188* 125* 104*   Electrolytes  Recent Labs Lab 06/27/15 1533 06/28/15 0327 06/29/15 0353  CALCIUM 9.0 7.9* 7.9*  MG  --   --  1.8  PHOS  --   --  2.5   Sepsis Markers  Recent Labs Lab 06/27/15 1935 06/27/15 2320 06/27/15 2323 06/28/15 0327  LATICACIDVEN 6.7* 1.8  --  1.5  PROCALCITON  --   --  0.22  --    ABG  Recent Labs Lab 06/27/15 2154 06/28/15 0240 06/29/15 0409  PHART 7.567* 7.430 7.345*  PCO2ART 22.7* 33.7* 39.2  PO2ART 135* 63.1* 115*   Liver Enzymes  Recent Labs Lab 06/27/15 1536  AST 31  ALT 10*  ALKPHOS 68  BILITOT 0.6  ALBUMIN 4.0   Cardiac Enzymes  Recent Labs Lab 06/27/15 1533 06/27/15 2323  TROPONINI <0.03 <0.03   Glucose  Recent Labs Lab 06/28/15 1244 06/28/15 1529 06/28/15 2011 06/28/15 2340 06/29/15 0328 06/29/15 0740  GLUCAP 85 72 86 70 106* 92    Imaging Dg Chest Port 1 View  06/29/2015  CLINICAL DATA:  Respiratory failure, endotracheal tube position. EXAM: PORTABLE CHEST 1 VIEW COMPARISON:  06/27/2015 and CT chest 04/21/2015. FINDINGS: Endotracheal tube terminates 4.4 cm above the carina. Nasogastric tube terminates in the stomach. Heart size stable. Left upper lobe masslike consolidation, hilar retraction and apical opacification appear grossly stable. Mild right basilar airspace opacification. Tiny left pleural effusion or pleural thickening. Right apical pleural  thickening. IMPRESSION: 1. Left upper lobe mass and associated treatment changes in the apical left hemi thorax. 2. Bibasilar airspace opacification, possibly due to an infectious bronchiolitis or bronchopneumonia. Electronically Signed   By: Lorin Picket M.D.   On: 06/29/2015 07:20     ASSESSMENT / PLAN:  PULMONARY OETT 10/23 >> A: VDRF in setting of seizures Stage IIA (T2a, N1, M0) non-small cell lung cancer, squamous cell carcinoma LUL P:   MV support, 8cc/kg Intermittent CXR  VAP bundle Daily SBT / WUA  S/p chemoradiation last dose 12/2013 Currently on Immunotherapy with Nivolumab 3 mg/kg given every 2 weeks. Status post 6 cycles.  CARDIOVASCULAR A:  Hypertension P:  Telemetry Not on home antihypertensives  RENAL A:   Acute renal failure (baseline creatinine 1), likely due to rhabdomyolysis in setting seizure. High AG metabolic acidosis > lactic (improving).  Suspect in setting of seizures Hypomagnesemia  P:   Gentle hydration  Trend CK Monitor BMP / UOP  Replace electrolytes as indicated  GASTROINTESTINAL A:   GERD P:   Nothing by mouth Pepcid for SUP If not extubated 10/25, add TF  HEMATOLOGIC A:   Lung Ca as above Anemia  P:  Follow CBC Lovenox for VTE ppx  INFECTIOUS A:   Leukocytosis Fever - tmax 101.1, ? If related to initial seizure activity, lung cancer.  R/o infectious etiology. WBC wnl  P:   Trend WBC and fever curve Neuro considering LP   Vanco, start date, 10/24>> Zosyn, start date, 10/24>> Acyclovir, empiric, start date 10/25 >>   ENDOCRINE A:   No acute issues  P:   Follow glucose on chemistry  NEUROLOGIC A:   Seizures Acute encephalopathy secondary to recurrent seizures/post-ictal Depression Dementia Intellectual disability P:   RASS goal: -1 Neurology following Keppra IV Propofol/ fentanyl PRN versed for seizures while on vent EEG in am  Holding home ativan, trazodone Resume Aricept, risperdal 10/25  FAMILY   - Updates: No family available  - Inter-disciplinary family meet or Palliative Care meeting due by:  07/04/2015    [name], NP-C [facility] Pgr: [phone] or if no answer [phone] 06/29/2015, 10:25 AM

## 2015-06-29 NOTE — Procedures (Signed)
LP Procedure Note:  Patient has been seen and examined.  Chart has been reviewed.  LP is being performed due  To concern for CNS infection.  Procedure has been explained to medical POA including risks and benefits.  Consent has been signed by [facility] and witnessed.   Blood pressure 98/65, pulse 83, temperature 99.9 F (37.7 C), temperature source Core (Comment), resp. rate 21, height 6' (1.829 m), weight 70.1 kg (154 lb 8.7 oz), SpO2 100 %.   Current facility-administered medications:  .  0.9 %  sodium chloride infusion, 250 mL, Intravenous, PRN, Dimas Chyle, MD, Last Rate: 10 mL/hr at 06/29/15 0600, 250 mL at 06/29/15 0600 .  acetaminophen (TYLENOL) solution 650 mg, 650 mg, Per Tube, Q6H PRN, Juanito Doom, MD, 650 mg at 06/29/15 0909 .  antiseptic oral rinse solution (CORINZ), 7 mL, Mouth Rinse, QID, Brand Males, MD, 7 mL at 06/29/15 0400 .  bisacodyl (DULCOLAX) suppository 10 mg, 10 mg, Rectal, Daily PRN, Dimas Chyle, MD .  chlorhexidine gluconate (PERIDEX) 0.12 % solution 15 mL, 15 mL, Mouth Rinse, BID, Brand Males, MD, 15 mL at 06/29/15 0800 .  dextrose 5 %-0.9 % sodium chloride infusion, , Intravenous, Continuous, Anders Simmonds, MD, Last Rate: 75 mL/hr at 06/29/15 0600 .  donepezil (ARICEPT) tablet 10 mg, 10 mg, Oral, QHS, Brandi L Ollis, NP .  enoxaparin (LOVENOX) injection 40 mg, 40 mg, Subcutaneous, Q24H, Dimas Chyle, MD, 40 mg at 06/28/15 2059 .  famotidine (PEPCID) IVPB 20 mg premix, 20 mg, Intravenous, Q12H, Corey Harold, NP, 20 mg at 06/29/15 0910 .  fentaNYL (SUBLIMAZE) 2,500 mcg in sodium chloride 0.9 % 250 mL (10 mcg/mL) infusion, 25-400 mcg/hr, Intravenous, Continuous, Dimas Chyle, MD, Stopped at 06/29/15 0930 .  fentaNYL (SUBLIMAZE) bolus via infusion 25 mcg, 25 mcg, Intravenous, Q1H PRN, Dimas Chyle, MD, 25 mcg at 06/28/15 2250 .  lacosamide (VIMPAT) 100 mg in sodium chloride 0.9 % 25 mL IVPB, 100 mg, Intravenous, Q12H, Drema Dallas, DO, 100 mg at 06/29/15 1045 .   levETIRAcetam (KEPPRA) 1,500 mg in sodium chloride 0.9 % 100 mL IVPB, 1,500 mg, Intravenous, Q12H, Dimas Chyle, MD, 1,500 mg at 06/29/15 0416 .  midazolam (VERSED) injection 2 mg, 2 mg, Intravenous, Q2H PRN, Raylene Miyamoto, MD, 2 mg at 06/29/15 1045 .  piperacillin-tazobactam (ZOSYN) IVPB 3.375 g, 3.375 g, Intravenous, Q8H, Dorrene German, RPH, 3.375 g at 06/29/15 0739 .  propofol (DIPRIVAN) 1000 MG/100ML infusion, 5-70 mcg/kg/min, Intravenous, Titrated, Ezequiel Essex, MD, Stopped at 06/28/15 2121 .  risperiDONE (RISPERDAL) tablet 3 mg, 3 mg, Oral, QHS, Brandi L Ollis, NP .  sennosides (SENOKOT) 8.8 MG/5ML syrup 5 mL, 5 mL, Per Tube, BID PRN, Dimas Chyle, MD .  vancomycin (VANCOCIN) 500 mg in sodium chloride 0.9 % 100 mL IVPB, 500 mg, Intravenous, Q12H, Dorrene German, RPH, 500 mg at 06/29/15 0910   Recent Labs  06/27/15 1533 06/28/15 0327 06/29/15 0353  WBC 11.3* 11.0* 8.9  HGB 13.9 12.1* 11.8*  HCT 44.2 36.5* 37.6*  PLT 226 185 148*  INR 1.25  --   --     CT of head: no acute process, no mass lesion  Patient was placed in the lateral decub/sitting position.  Area was cleaned with betadine and anesthetized with lidocaine.  Under sterile conditions 20G LP needle was placed at approximately L3-4 without difficulty.  Opening pressure was not documented.  Approximately 12 cc of clear fluid was obtained and sent for studies.  No  complications were noted.    Jim Like, DO [facility] [phone]  If 7pm- 7am, please page neurology on call as listed in [place].

## 2015-06-30 ENCOUNTER — Inpatient Hospital Stay (HOSPITAL_COMMUNITY): Payer: Medicare Other

## 2015-06-30 DIAGNOSIS — G40901 Epilepsy, unspecified, not intractable, with status epilepticus: Secondary | ICD-10-CM | POA: Diagnosis not present

## 2015-06-30 DIAGNOSIS — R569 Unspecified convulsions: Secondary | ICD-10-CM | POA: Diagnosis not present

## 2015-06-30 DIAGNOSIS — G40909 Epilepsy, unspecified, not intractable, without status epilepticus: Secondary | ICD-10-CM | POA: Diagnosis not present

## 2015-06-30 LAB — GLUCOSE, CAPILLARY
GLUCOSE-CAPILLARY: 90 mg/dL (ref 65–99)
GLUCOSE-CAPILLARY: 93 mg/dL (ref 65–99)
GLUCOSE-CAPILLARY: 89 mg/dL (ref 65–99)
GLUCOSE-CAPILLARY: 68 mg/dL (ref 65–99)
Glucose-Capillary: 121 mg/dL — ABNORMAL HIGH (ref 65–99)
Glucose-Capillary: 72 mg/dL (ref 65–99)
Glucose-Capillary: 85 mg/dL (ref 65–99)
Glucose-Capillary: 92 mg/dL (ref 65–99)
Glucose-Capillary: 97 mg/dL (ref 65–99)

## 2015-06-30 LAB — CBC
HCT: 32.1 % — ABNORMAL LOW (ref 39.0–52.0)
Hemoglobin: 10.4 g/dL — ABNORMAL LOW (ref 13.0–17.0)
MCH: 28.9 pg (ref 26.0–34.0)
MCHC: 32.4 g/dL (ref 30.0–36.0)
MCV: 89.2 fL (ref 78.0–100.0)
PLATELETS: 132 10*3/uL — AB (ref 150–400)
RBC: 3.6 MIL/uL — AB (ref 4.22–5.81)
RDW: 15.3 % (ref 11.5–15.5)
WBC: 8.3 10*3/uL (ref 4.0–10.5)

## 2015-06-30 LAB — MAGNESIUM: Magnesium: 1.9 mg/dL (ref 1.7–2.4)

## 2015-06-30 LAB — VANCOMYCIN, TROUGH: Vancomycin Tr: 16 ug/mL (ref 10.0–20.0)

## 2015-06-30 LAB — CK
CK TOTAL: 1169 U/L — AB (ref 49–397)
Total CK: 703 U/L — ABNORMAL HIGH (ref 49–397)

## 2015-06-30 LAB — BASIC METABOLIC PANEL
ANION GAP: 6 (ref 5–15)
BUN: 5 mg/dL — ABNORMAL LOW (ref 6–20)
CALCIUM: 7.8 mg/dL — AB (ref 8.9–10.3)
CHLORIDE: 112 mmol/L — AB (ref 101–111)
CO2: 23 mmol/L (ref 22–32)
Creatinine, Ser: 0.88 mg/dL (ref 0.61–1.24)
GFR calc Af Amer: >60 mL/min (ref >60)
GFR calc non Af Amer: >60 mL/min (ref >60)
Glucose, Bld: 109 mg/dL — ABNORMAL HIGH (ref 65–99)
Potassium: 3.5 mmol/L (ref 3.5–5.1)
Sodium: 141 mmol/L (ref 135–145)

## 2015-06-30 LAB — HERPES SIMPLEX VIRUS(HSV) DNA BY PCR
HSV 1 DNA: NEGATIVE
HSV 2 DNA: NEGATIVE

## 2015-06-30 LAB — PROCALCITONIN: Procalcitonin: <0.1 ng/mL

## 2015-06-30 MED ORDER — BISACODYL 5 MG PO TBEC: 5.0000 mg | DELAYED_RELEASE_TABLET | Freq: Every day | ORAL | Status: DC | PRN

## 2015-06-30 MED ORDER — LORAZEPAM 1 MG PO TABS
2.0000 mg | ORAL_TABLET | Freq: Every day | ORAL | Status: DC
  Administered 2015-06-30: 2 mg via ORAL
  Filled 2015-06-30: qty 2

## 2015-06-30 MED ORDER — POTASSIUM CHLORIDE 20 MEQ/15ML (10%) PO SOLN
20.0000 meq | ORAL | Status: AC
  Administered 2015-06-30 (×2): 20 meq
  Filled 2015-06-30 (×2): qty 15

## 2015-06-30 MED ORDER — POTASSIUM CHLORIDE CRYS ER 20 MEQ PO TBCR
20.0000 meq | EXTENDED_RELEASE_TABLET | ORAL | Status: DC
  Filled 2015-06-30: qty 1

## 2015-06-30 MED ORDER — FLUTICASONE PROPIONATE 50 MCG/ACT NA SUSP
2.0000 | Freq: Every day | NASAL | Status: DC
  Administered 2015-06-30 – 2015-07-05 (×6): 2 via NASAL
  Filled 2015-06-30: qty 16

## 2015-06-30 MED ORDER — DEXTROSE 50 % IV SOLN
INTRAVENOUS | Status: AC
  Administered 2015-06-30: 25 mL
  Filled 2015-06-30: qty 50

## 2015-06-30 MED ORDER — TRAZODONE 25 MG HALF TABLET
25.0000 mg | ORAL_TABLET | Freq: Every day | ORAL | Status: DC
  Administered 2015-06-30 – 2015-07-04 (×4): 25 mg via ORAL
  Filled 2015-06-30 (×5): qty 1

## 2015-06-30 NOTE — Clinical Social Work Note (Signed)
Clinical Social Work Assessment  Patient Details  Name: Marco [name] MRN: [id number] Date of Birth: 1951-05-11  Date of referral:  06/30/15               Reason for consult:  Discharge Planning, Facility Placement                Permission sought to share information with:  Facility Art therapist granted to share information::  Yes, Verbal Permission Granted  Name::        Agency::     Relationship::     Contact Information:     Housing/Transportation Living arrangements for the past 2 months:  Group Home Source of Information:  Facility Patient Interpreter Needed:  None Criminal Activity/Legal Involvement Pertinent to Current Situation/Hospitalization:  No - Comment as needed Significant Relationships:  Siblings Lives with:  Facility Resident Do you feel safe going back to the place where you live?  Yes Need for family participation in patient care:  Yes (Comment)  Care giving concerns: No concerns reported by group home caregiver at this time.   Social Worker assessment / plan:  Pt hospitalized on 06/27/15 with multiple concerns including VDRF in setting of seizures and CA s/p chemoradiation. Pt was intubated on 10/23 and extubated on 10/26. Pt has a hx of MR / dementia.Pt is from [place] group home. CSW met with [name], caregiver from group home to assist with d/c planning. CSW also left message for owner of group home [facility] [name] [phone] ) to contact CSW for assistance with d/c planning. [name] reports pt was participating in [place] prior to hospitalization and needs to return to baseline in order to return to group home. PT has been requested.  HHPT can be arranged at group home , if needed. Pt has received [place] services in th past. It is unclear if FL2 is needed at d/c. This will be clarified once Ms. Jones contacts CSW. Pt sister ( Ms. Owens Shark [phone] ) should arrive from [place] late this evening. CSW will continue to follow to assist  with d/c planning.  Employment status:  Disabled (Comment on whether or not currently receiving Disability) Insurance information:  Medicare, Medicaid In [place] PT Recommendations:  Not assessed at this time Information / Referral to community resources:   (not required at this time)  Patient/Family's Response to care:  No family at bedside.   Patient/Family's Understanding of and Emotional Response to Diagnosis, Current Treatment, and Prognosis:  No family at bedside. Pt has MR / dementia. Group Home would like pt to return at d/c provided they can meet his needs.  Emotional Assessment Appearance:  Appears stated age Attitude/Demeanor/Rapport:  Other (cooperative) Affect (typically observed):  Calm Orientation:  Oriented to Self Alcohol / Substance use:    Psych involvement (Current and /or in the community):  No (Comment)  Discharge Needs  Concerns to be addressed:  Discharge Planning Concerns Readmission within the last 30 days:  No Current discharge risk:  None Barriers to Discharge:  No Barriers Identified   Luretha Rued, [place] 06/30/2015, 2:17 PM

## 2015-06-30 NOTE — Progress Notes (Signed)
ETT moved from 22 cm measured from his top lip to 26 cm. Per MD order. Patient receiving better tidal volumes. Tolerating well.

## 2015-06-30 NOTE — Progress Notes (Signed)
Subjective: No further seizure activity. Tmax 100.4 overnight. Extubated overnight. Mental status improved this morning.   LP completed yesterday shows normal protein, Gram stain negative, 2-3 WBCs. HSV PCR pending  Objective: Current vital signs: BP 113/81 mmHg  Pulse 110  Temp(Src) 99.7 F (37.6 C) (Core (Comment))  Resp 19  Ht 6' (1.829 m)  Wt 72 kg (158 lb 11.7 oz)  BMI 21.52 kg/m2  SpO2 100% Vital signs in last 24 hours: Temp:  [98.4 F (36.9 C)-100.4 F (38 C)] 99.7 F (37.6 C) (10/26 0800) Pulse Rate:  [62-110] 110 (10/26 0800) Resp:  [13-27] 19 (10/26 0800) BP: (80-137)/(41-85) 113/81 mmHg (10/26 0800) SpO2:  [93 %-100 %] 100 % (10/26 0800) FiO2 (%):  [30 %] 30 % (10/26 0800) Weight:  [72 kg (158 lb 11.7 oz)] 72 kg (158 lb 11.7 oz) (10/26 0437)  Intake/Output from previous day: 10/25 0701 - 10/26 0700 In: 3904.5 [I.V.:2170; NG/GT:180; IV Piggyback:1554.5] Out: 1910 [Urine:1335; Emesis/NG output:575] Intake/Output this shift: Total I/O In: 137.5 [I.V.:87.5; IV Piggyback:50] Out: -  Nutritional status: Diet NPO time specified  Neurologic Exam: Mental Status:  Extubated, awake, oriented to name only. Able to follow simple commands Cranial Nerves: II: pupils equal round, reactive to light III,IV, VI: ptosis not present, eyes midline, no gaze deviation V,VII: face symmetric,  Motor: Spontaneously moves all extremities symmetrically and against light resistance Sensory: withdrawals to noxious stimuli in all extremities  Lab Results: Basic Metabolic Panel:  Recent Labs Lab 06/27/15 1533 06/28/15 0327 06/29/15 0353 06/30/15 0340  NA 140 141 146* 141  K 4.4 3.1* 3.2* 3.5  CL 107 112* 116* 112*  CO2 16* '23 24 23  '$ GLUCOSE 188* 125* 104* 109*  BUN '15 14 8 '$ 5*  CREATININE 1.38* 1.15 1.10 0.88  CALCIUM 9.0 7.9* 7.9* 7.8*  MG  --   --  1.8 1.9  PHOS  --   --  2.5  --     Liver Function Tests:  Recent Labs Lab 06/27/15 1536  AST 31  ALT 10*  ALKPHOS  68  BILITOT 0.6  PROT 7.4  ALBUMIN 4.0   No results for input(s): LIPASE, AMYLASE in the last 168 hours. No results for input(s): AMMONIA in the last 168 hours.  CBC:  Recent Labs Lab 06/27/15 1533 06/28/15 0327 06/29/15 0353 06/30/15 0340  WBC 11.3* 11.0* 8.9 8.3  NEUTROABS 8.0*  --   --   --   HGB 13.9 12.1* 11.8* 10.4*  HCT 44.2 36.5* 37.6* 32.1*  MCV 90.6 87.1 90.2 89.2  PLT 226 185 148* 132*    Cardiac Enzymes:  Recent Labs Lab 06/27/15 1533 06/27/15 2058 06/27/15 2323 06/30/15 0340  CKTOTAL  --  221  --  703*  TROPONINI <0.03  --  <0.03  --     Lipid Panel: No results for input(s): CHOL, TRIG, HDL, CHOLHDL, VLDL, LDLCALC in the last 168 hours.  CBG:  Recent Labs Lab 06/29/15 1551 06/29/15 2019 06/30/15 0001 06/30/15 0328 06/30/15 0731  GLUCAP 79 90 85 73 121*    Microbiology: Results for orders placed or performed during the hospital encounter of 06/27/15  MRSA PCR Screening     Status: None   Collection Time: 06/27/15  7:00 PM  Result Value Ref Range Status   MRSA by PCR NEGATIVE NEGATIVE Final    Comment:        The GeneXpert MRSA Assay (FDA approved for NASAL specimens only), is one component of a comprehensive MRSA colonization  surveillance program. It is not intended to diagnose MRSA infection nor to guide or monitor treatment for MRSA infections.   Culture, blood (x 2)     Status: None (Preliminary result)   Collection Time: 06/27/15 11:20 PM  Result Value Ref Range Status   Specimen Description BLOOD BLOOD LEFT HAND  Final   Special Requests BOTTLES DRAWN AEROBIC ONLY 10CC  Final   Culture   Final    NO GROWTH 1 DAY Performed at [facility]    Report Status PENDING  Incomplete  Culture, blood (x 2)     Status: None (Preliminary result)   Collection Time: 06/27/15 11:23 PM  Result Value Ref Range Status   Specimen Description BLOOD BLOOD RIGHT ARM  Final   Special Requests BOTTLES DRAWN AEROBIC AND ANAEROBIC 5CC   Final   Culture   Final    NO GROWTH 1 DAY Performed at [facility]    Report Status PENDING  Incomplete  CSF culture     Status: None (Preliminary result)   Collection Time: 06/29/15 10:46 AM  Result Value Ref Range Status   Specimen Description CSF  Final   Special Requests Normal  Final   Gram Stain   Final    CYTOSPIN WBC PRESENT, PREDOMINANTLY PMN NO ORGANISMS SEEN Gram Stain Report Called to,Read Back By and Verified With: ARNOLD RN AT 1140 ON 10.25.16 BY SHUEA    Culture   Final    NO GROWTH < 24 HOURS Performed at [facility]    Report Status PENDING  Incomplete    Coagulation Studies:  Recent Labs  06/27/15 1533  LABPROT 15.8*  INR 1.25    Imaging: Dg Chest Port 1 View  06/30/2015  CLINICAL DATA:  Respiratory failure, history of lung malignancy. EXAM: PORTABLE CHEST 1 VIEW COMPARISON:  Portable chest x-ray of June 29, 2015 FINDINGS: The right lung is well-expanded. There is stable volume loss on the left with left upper lobe mass and pleural thickening atypically. There is no alveolar infiltrate. The interstitial markings are coarse but stable bilaterally. The heart is normal in size. There remains partial obscuration of the left hemidiaphragm. The endotracheal tube has been advanced and its tip now lies 3.9 cm above the carina. The esophagogastric tube tip projects below the inferior margin of the image. IMPRESSION: Interval repositioning of the endotracheal tube such that its tip is now 4 cm above the carina. There is no significant interval change in the appearance of the chest. Electronically Signed   By: David  Martinique M.D.   On: 06/30/2015 07:03   Dg Chest Port 1 View  06/29/2015  CLINICAL DATA:  Endotracheal tube replacement.  Initial encounter. EXAM: PORTABLE CHEST 1 VIEW COMPARISON:  Radiograph performed earlier today at 4:50 a.m. FINDINGS: The endotracheal tube is seen ending 8 cm above the carina. This could be advanced 4-5 cm. The enteric  tube is seen extending below the diaphragm. The left upper lobe mass, hilar retraction and apical opacification are relatively stable. Right basilar airspace opacity may reflect asymmetric pulmonary edema or pneumonia. No pleural effusion or pneumothorax is seen. The cardiomediastinal silhouette is borderline normal in size. No acute osseous abnormalities are identified. IMPRESSION: 1. Endotracheal tube seen ending 8 cm above the carina. This could be advanced 4-5 cm. 2. Right basilar airspace opacity may reflect asymmetric pulmonary edema or pneumonia. 3. Left upper lobe mass, hilar retraction and apical opacification are relatively stable in appearance. These results were called by telephone  at the time of interpretation on 06/29/2015 at 11:37 pm to Nursing in the [facility], who verbally acknowledged these results. Electronically Signed   By: Garald Balding M.D.   On: 06/29/2015 23:41   Dg Chest Port 1 View  06/29/2015  CLINICAL DATA:  Respiratory failure, endotracheal tube position. EXAM: PORTABLE CHEST 1 VIEW COMPARISON:  06/27/2015 and CT chest 04/21/2015. FINDINGS: Endotracheal tube terminates 4.4 cm above the carina. Nasogastric tube terminates in the stomach. Heart size stable. Left upper lobe masslike consolidation, hilar retraction and apical opacification appear grossly stable. Mild right basilar airspace opacification. Tiny left pleural effusion or pleural thickening. Right apical pleural thickening. IMPRESSION: 1. Left upper lobe mass and associated treatment changes in the apical left hemi thorax. 2. Bibasilar airspace opacification, possibly due to an infectious bronchiolitis or bronchopneumonia. Electronically Signed   By: Lorin Picket M.D.   On: 06/29/2015 07:20    Medications:  Scheduled: . acyclovir  10 mg/kg Intravenous 3 times per day  . antiseptic oral rinse  7 mL Mouth Rinse QID  . chlorhexidine gluconate  15 mL Mouth Rinse BID  . donepezil  10 mg Oral QHS  .  enoxaparin (LOVENOX) injection  40 mg Subcutaneous Q24H  . famotidine (PEPCID) IV  20 mg Intravenous Q12H  . lacosamide (VIMPAT) IV  100 mg Intravenous Q12H  . levETIRAcetam  1,500 mg Intravenous Q12H  . piperacillin-tazobactam (ZOSYN)  IV  3.375 g Intravenous Q8H  . risperiDONE  3 mg Oral QHS  . vancomycin  500 mg Intravenous Q12H    Assessment/Plan:  64y/o hx of seizures, developmental disability, lung CA presenting with breakthrough seizures. Intubated in ED for airway protection. Unclear etiology of breakthrough seizures. LP results so far unremarkable.    -continue keppra '1500mg'$  BID -continue vimpat '100mg'$  BID -based on LP results and improved mental status would d/c acyclovir -will continue to follow     LOS: 3 days   Jim Like, DO [facility] [phone]  If 7pm- 7am, please page neurology on call as listed in [place]. 06/30/2015  9:37 AM

## 2015-06-30 NOTE — Progress Notes (Signed)
180cc of 10mg/ml fentanyl wasted down the sink with SEmeterio ReeveEarly, RN

## 2015-06-30 NOTE — Procedures (Signed)
Extubation Procedure Note  Patient Details:   Name: Marco [name] DOB: December 09, 1950 MRN: [id number]   Airway Documentation:     Evaluation  O2 sats: 417 Complications: none Patient tolerated procedure well. Bilateral Breath Sounds: Clear Suctioning: Oral, Airway Pt not speaking at this time  Per CCM order, pt extubated and placed on 2L nasal cannula.  Pt was stable throughout the procedure, no complications.  Martha Clan 06/30/2015, 9:42 AM

## 2015-06-30 NOTE — Progress Notes (Signed)
Date: June 30, 2015 Chart reviewed for concurrent status and case management needs. Will continue to follow patient for changes and needs: Extubated this am. Velva Harman, RN, BSN, CCM   [phone]

## 2015-06-30 NOTE — Progress Notes (Signed)
PULMONARY / CRITICAL CARE MEDICINE   Name: Marco [name] MRN: [id number] DOB: 02-Nov-1950    ADMISSION DATE:  06/27/2015 CONSULTATION DATE:  06/27/2015  REFERRING MD :  EDP  CHIEF COMPLAINT:  Seizure  INITIAL PRESENTATION: 64 year old male with history of lung cancer and seizures who presented to [facility] ED 10/23 after having 3 witnessed tonic-clonic seizures at group home where he resides. In ED he required intubation for airway protection. PCCM to admit with neurology consultation   STUDIES:  CT head 10/23 > Mild diffuse cortical atrophy. No acute intracranial abnormality seen. Multilevel degenerative disc disease. No acute abnormality seen in the cervical spine.  SIGNIFICANT EVENTS: 10/23  Admit to ICU on ventilator 10/25  Fever tmax 101.1, failed SBT due to sedation  10/26  Extubated to [place] O2   SUBJECTIVE:   RT reports patient weaning on vent, no distress.  Tolerating 5/5 without distress.  Informed by staff that the patient usually goes by "Scooby"  VITAL SIGNS: Temp:  [98.4 F (36.9 C)-100.4 F (38 C)] 99.7 F (37.6 C) (10/26 0800) Pulse Rate:  [62-110] 110 (10/26 0800) Resp:  [13-27] 19 (10/26 0800) BP: (80-137)/(41-85) 113/81 mmHg (10/26 0800) SpO2:  [93 %-100 %] 100 % (10/26 0800) FiO2 (%):  [30 %] 30 % (10/26 0800) Weight:  [158 lb 11.7 oz (72 kg)] 158 lb 11.7 oz (72 kg) (10/26 0437) HEMODYNAMICS:   VENTILATOR SETTINGS: Vent Mode:  [-] PRVC FiO2 (%):  [30 %] 30 % Set Rate:  [20 bmp] 20 bmp Vt Set:  [460 mL] 460 mL PEEP:  [5 cmH20] 5 cmH20 Plateau Pressure:  [13 cmH20-17 cmH20] 16 cmH20 INTAKE / OUTPUT:  Intake/Output Summary (Last 24 hours) at 06/30/15 1055 Last data filed at 06/30/15 1045  Gross per 24 hour  Intake   3132 ml  Output   1985 ml  Net   1147 ml    PHYSICAL EXAMINATION: General:  Male of normal body habitus in NAD Neuro:  Awake/alert, follows commands, MAE HEENT:  [place]/AT, PERRL, no appreciable JVD Cardiovascular:  RRR, no MRG Lungs:   Clear bilateral breath sounds Abdomen:  Soft nondistended Musculoskeletal:  No acute deformity Skin:  Grossly intact  LABS:  CBC  Recent Labs Lab 06/28/15 0327 06/29/15 0353 06/30/15 0340  WBC 11.0* 8.9 8.3  HGB 12.1* 11.8* 10.4*  HCT 36.5* 37.6* 32.1*  PLT 185 148* 132*   Coag's  Recent Labs Lab 06/27/15 1533 06/27/15 2323  APTT  --  29  INR 1.25  --    BMET  Recent Labs Lab 06/28/15 0327 06/29/15 0353 06/30/15 0340  NA 141 146* 141  K 3.1* 3.2* 3.5  CL 112* 116* 112*  CO2 '23 24 23  '$ BUN 14 8 5*  CREATININE 1.15 1.10 0.88  GLUCOSE 125* 104* 109*   Electrolytes  Recent Labs Lab 06/28/15 0327 06/29/15 0353 06/30/15 0340  CALCIUM 7.9* 7.9* 7.8*  MG  --  1.8 1.9  PHOS  --  2.5  --    Sepsis Markers  Recent Labs Lab 06/27/15 1935 06/27/15 2320 06/27/15 2323 06/28/15 0327  LATICACIDVEN 6.7* 1.8  --  1.5  PROCALCITON  --   --  0.22  --    ABG  Recent Labs Lab 06/27/15 2154 06/28/15 0240 06/29/15 0409  PHART 7.567* 7.430 7.345*  PCO2ART 22.7* 33.7* 39.2  PO2ART 135* 63.1* 115*   Liver Enzymes  Recent Labs Lab 06/27/15 1536  AST 31  ALT 10*  ALKPHOS 68  BILITOT 0.6  ALBUMIN 4.0   Cardiac Enzymes  Recent Labs Lab 06/27/15 1533 06/27/15 2323  TROPONINI <0.03 <0.03   Glucose  Recent Labs Lab 06/29/15 1208 06/29/15 1551 06/29/15 2019 06/30/15 0001 06/30/15 0328 06/30/15 0731  GLUCAP 99 79 90 85 92 121*    Imaging Dg Chest Port 1 View  06/30/2015  CLINICAL DATA:  Respiratory failure, history of lung malignancy. EXAM: PORTABLE CHEST 1 VIEW COMPARISON:  Portable chest x-ray of June 29, 2015 FINDINGS: The right lung is well-expanded. There is stable volume loss on the left with left upper lobe mass and pleural thickening atypically. There is no alveolar infiltrate. The interstitial markings are coarse but stable bilaterally. The heart is normal in size. There remains partial obscuration of the left hemidiaphragm. The  endotracheal tube has been advanced and its tip now lies 3.9 cm above the carina. The esophagogastric tube tip projects below the inferior margin of the image. IMPRESSION: Interval repositioning of the endotracheal tube such that its tip is now 4 cm above the carina. There is no significant interval change in the appearance of the chest. Electronically Signed   By: David  Martinique M.D.   On: 06/30/2015 07:03   Dg Chest Port 1 View  06/29/2015  CLINICAL DATA:  Endotracheal tube replacement.  Initial encounter. EXAM: PORTABLE CHEST 1 VIEW COMPARISON:  Radiograph performed earlier today at 4:50 a.m. FINDINGS: The endotracheal tube is seen ending 8 cm above the carina. This could be advanced 4-5 cm. The enteric tube is seen extending below the diaphragm. The left upper lobe mass, hilar retraction and apical opacification are relatively stable. Right basilar airspace opacity may reflect asymmetric pulmonary edema or pneumonia. No pleural effusion or pneumothorax is seen. The cardiomediastinal silhouette is borderline normal in size. No acute osseous abnormalities are identified. IMPRESSION: 1. Endotracheal tube seen ending 8 cm above the carina. This could be advanced 4-5 cm. 2. Right basilar airspace opacity may reflect asymmetric pulmonary edema or pneumonia. 3. Left upper lobe mass, hilar retraction and apical opacification are relatively stable in appearance. These results were called by telephone at the time of interpretation on 06/29/2015 at 11:37 pm to Nursing in the [facility], who verbally acknowledged these results. Electronically Signed   By: Garald Balding M.D.   On: 06/29/2015 23:41     ASSESSMENT / PLAN:  PULMONARY OETT 10/23 >> 10/26 A: VDRF in setting of seizures Stage IIA (T2a, N1, M0) non-small cell lung cancer, squamous cell carcinoma LUL, S/p chemoradiation last dose 12/2013. Currently on Immunotherapy with Nivolumab 3 mg/kg given every 2 weeks. Status post 6 cycles P:    Meets criteria for extubation am 10/26 Intermittent CXR  Oxygen as needed to support sats > 92% Pulmonary hygiene as able - upright positioning, doubt he can do IS/Flutter  Monitor oral secretions   CARDIOVASCULAR A:  Hypertension - no issues during admit  P:  Telemetry monitoring  Not on home antihypertensives  RENAL A:   Acute renal failure (baseline creatinine 1), likely due to rhabdomyolysis in setting seizure. High AG metabolic acidosis > lactic (improving).  Suspect in setting of seizures Hypomagnesemia  P:   Gentle hydration  Trend CK Monitor BMP / UOP  Replace electrolytes as indicated  GASTROINTESTINAL A:   GERD P:   Nothing by mouth x4 hours, then advance as tolerated  Pepcid for SUP  HEMATOLOGIC A:   Lung Ca as above Anemia  P:  Follow CBC Lovenox for VTE ppx  INFECTIOUS A:   Leukocytosis Fever - tmax 101.1, ? If related to initial seizure activity, lung cancer.  R/o infectious etiology. 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    - Inter-disciplinary family meet or Palliative Care meeting due by:  07/04/2015   Transfer to [facility] for am 10/27.  Remain in SDU for respiratory monitoring.   [name], NP-C [facility] Pgr: [phone] or if no answer [phone] 06/30/2015, 10:55 AM

## 2015-06-30 NOTE — Progress Notes (Signed)
ANTIBIOTIC CONSULT NOTE - FOLLOW UP  Pharmacy Consult for Vancomycin, Zosyn Indication: r/o CNS infection  No Known Allergies  Patient Measurements: Height: 6' (182.9 cm) Weight: 158 lb 11.7 oz (72 kg) IBW/kg (Calculated) : 77.6  Vital Signs: Temp: 99.7 F (37.6 C) (10/26 0800) Temp Source: Core (Comment) (10/26 0800) BP: 113/81 mmHg (10/26 0800) Pulse Rate: 110 (10/26 0800) Intake/Output from previous day: 10/25 0701 - 10/26 0700 In: 3904.5 [I.V.:2170; NG/GT:180; IV Piggyback:1554.5] Out: 1910 [Urine:1335; Emesis/NG output:575] Intake/Output from this shift: Total I/O In: 137.5 [I.V.:87.5; IV Piggyback:50] Out: -   Labs:  Recent Labs  06/28/15 0327 06/29/15 0353 06/30/15 0340  WBC 11.0* 8.9 8.3  HGB 12.1* 11.8* 10.4*  PLT 185 148* 132*  CREATININE 1.15 1.10 0.88   Estimated Creatinine Clearance: 84.1 mL/min (by C-G formula based on Cr of 0.88).  Recent Labs  06/30/15 0950  [facility] 16     Microbiology: Recent Results (from the past 720 hour(s))  MRSA PCR Screening     Status: None   Collection Time: 06/27/15  7:00 PM  Result Value Ref Range Status   MRSA by PCR NEGATIVE NEGATIVE Final    Comment:        The GeneXpert MRSA Assay (FDA approved for NASAL specimens only), is one component of a comprehensive MRSA colonization surveillance program. It is not intended to diagnose MRSA infection nor to guide or monitor treatment for MRSA infections.   Culture, blood (x 2)     Status: None (Preliminary result)   Collection Time: 06/27/15 11:20 PM  Result Value Ref Range Status   Specimen Description BLOOD BLOOD LEFT HAND  Final   Special Requests BOTTLES DRAWN AEROBIC ONLY 10CC  Final   Culture   Final    NO GROWTH 1 DAY Performed at [facility]    Report Status PENDING  Incomplete  Culture, blood (x 2)     Status: None (Preliminary result)   Collection Time: 06/27/15 11:23 PM  Result Value Ref Range Status   Specimen Description BLOOD  BLOOD RIGHT ARM  Final   Special Requests BOTTLES DRAWN AEROBIC AND ANAEROBIC 5CC  Final   Culture   Final    NO GROWTH 1 DAY Performed at [facility]    Report Status PENDING  Incomplete  CSF culture     Status: None (Preliminary result)   Collection Time: 06/29/15 10:46 AM  Result Value Ref Range Status   Specimen Description CSF  Final   Special Requests Normal  Final   Gram Stain   Final    CYTOSPIN WBC PRESENT, PREDOMINANTLY PMN NO ORGANISMS SEEN Gram Stain Report Called to,Read Back By and Verified With: ARNOLD RN AT 1140 ON 10.25.16 BY SHUEA    Culture   Final    NO GROWTH < 24 HOURS Performed at [facility]    Report Status PENDING  Incomplete    Anti-infectives    Start     Dose/Rate Route Frequency Ordered Stop   06/29/15 1230  acyclovir (ZOVIRAX) 700 mg in dextrose 5 % 150 mL IVPB     10 mg/kg  70.1 kg 164 mL/hr over 60 Minutes Intravenous 3 times per day 06/29/15 1152     06/28/15 1000  vancomycin (VANCOCIN) 500 mg in sodium chloride 0.9 % 100 mL IVPB     500 mg 100 mL/hr over 60 Minutes Intravenous Every 12 hours 06/27/15 2347     06/28/15 0800  piperacillin-tazobactam (ZOSYN) IVPB 3.375 g  3.375 g 12.5 mL/hr over 240 Minutes Intravenous Every 8 hours 06/27/15 2347     06/27/15 2300  piperacillin-tazobactam (ZOSYN) IVPB 3.375 g     3.375 g 100 mL/hr over 30 Minutes Intravenous  Once 06/27/15 2257 06/27/15 2342   06/27/15 2300  vancomycin (VANCOCIN) IVPB 1000 mg/200 mL premix     1,000 mg 200 mL/hr over 60 Minutes Intravenous  Once 06/27/15 2257 06/28/15 0030      Assessment: 72 yoM s/p 3 seizures at group home now with elevated temp and WBCs. Zosyn/Vancomycin per Rx for Sepsis.  No further seizure activity but continues to spike fevers.  LP obtained and added Acyclovir per pharmacy to r/o CNS infection.  10/23 >> zosyn >> 10/23 >> vancomycin  >>   10/25 >> acyclovir IV >> 10/26  10/23 blood: ng 10/25 LP: ngtd [gluc high, protein  wnl; Gm stain clear; HSV pcr IP]  Tmax 100.2 on APAP WBC: improved to wnl Renal: AKI resolved; CrCl 84 CG; UOP good Extubated today  Dose changes/levels: 10/26 0900 VT = 16 on '500mg'$  q12   Goal of Therapy:  Vancomycin trough level 15-20 mcg/ml  Eradication of infection Appropriate antibiotic dosing for indication and renal function  Plan:  Day 4 antibiotics Looks good other than minor fevers and bouts of hypotension.  CCM to recheck PCT tomorrow, will plan to d/c abx if improved Continue Vancomycin 500 mg IV q12 hr Measure vancomycin trough levels at steady state as indicated Zosyn 3.375 g IV given every 8 hrs by 4-hr infusion  Follow clinical course, renal function, culture results as available  Follow for de-escalation of antibiotics and LOT   Reuel Boom, PharmD, BCPS Pager: [phone] 06/30/2015, 10:47 AM

## 2015-06-30 NOTE — Progress Notes (Signed)
[facility] ADULT ICU REPLACEMENT PROTOCOL FOR AM LAB REPLACEMENT ONLY  The patient does apply for the [facility] Adult ICU Electrolyte Replacment Protocol based on the criteria listed below:   1. Is GFR >/= 40 ml/min? Yes.    Patient's GFR today is >60 2. Is urine output >/= 0.5 ml/kg/hr for the last 6 hours? Yes.   Patient's UOP is 0.77 ml/kg/hr 3. Is BUN < 60 mg/dL? Yes.    Patient's BUN today is 5 4. Abnormal electrolyte  K 3.5 5. Ordered repletion with: per protocol 6. If a panic level lab has been reported, has the CCM MD in charge been notified? Yes.  .   Physician:  Philbert Riser 06/30/2015 5:22 AM

## 2015-07-01 DIAGNOSIS — G40901 Epilepsy, unspecified, not intractable, with status epilepticus: Secondary | ICD-10-CM | POA: Diagnosis not present

## 2015-07-01 DIAGNOSIS — R569 Unspecified convulsions: Secondary | ICD-10-CM | POA: Diagnosis not present

## 2015-07-01 DIAGNOSIS — N179 Acute kidney failure, unspecified: Secondary | ICD-10-CM | POA: Diagnosis present

## 2015-07-01 DIAGNOSIS — A419 Sepsis, unspecified organism: Secondary | ICD-10-CM | POA: Diagnosis present

## 2015-07-01 DIAGNOSIS — G40909 Epilepsy, unspecified, not intractable, without status epilepticus: Secondary | ICD-10-CM | POA: Diagnosis not present

## 2015-07-01 DIAGNOSIS — J9601 Acute respiratory failure with hypoxia: Secondary | ICD-10-CM

## 2015-07-01 DIAGNOSIS — E876 Hypokalemia: Secondary | ICD-10-CM

## 2015-07-01 DIAGNOSIS — N390 Urinary tract infection, site not specified: Secondary | ICD-10-CM

## 2015-07-01 DIAGNOSIS — E870 Hyperosmolality and hypernatremia: Secondary | ICD-10-CM

## 2015-07-01 DIAGNOSIS — D72829 Elevated white blood cell count, unspecified: Secondary | ICD-10-CM

## 2015-07-01 DIAGNOSIS — F0390 Unspecified dementia without behavioral disturbance: Secondary | ICD-10-CM | POA: Diagnosis not present

## 2015-07-01 DIAGNOSIS — C3490 Malignant neoplasm of unspecified part of unspecified bronchus or lung: Secondary | ICD-10-CM

## 2015-07-01 DIAGNOSIS — F72 Severe intellectual disabilities: Secondary | ICD-10-CM

## 2015-07-01 LAB — CBC
HCT: 34.8 % — ABNORMAL LOW (ref 39.0–52.0)
Hemoglobin: 11.3 g/dL — ABNORMAL LOW (ref 13.0–17.0)
MCH: 28.4 pg (ref 26.0–34.0)
MCHC: 32.5 g/dL (ref 30.0–36.0)
MCV: 87.4 fL (ref 78.0–100.0)
PLATELETS: 166 10*3/uL (ref 150–400)
RBC: 3.98 MIL/uL — AB (ref 4.22–5.81)
RDW: 15 % (ref 11.5–15.5)
WBC: 7.8 10*3/uL (ref 4.0–10.5)

## 2015-07-01 LAB — BASIC METABOLIC PANEL
ANION GAP: 4 — AB (ref 5–15)
BUN: <5 mg/dL — ABNORMAL LOW (ref 6–20)
CALCIUM: 8.2 mg/dL — AB (ref 8.9–10.3)
CO2: 26 mmol/L (ref 22–32)
CREATININE: 0.9 mg/dL (ref 0.61–1.24)
Chloride: 110 mmol/L (ref 101–111)
GLUCOSE: 102 mg/dL — AB (ref 65–99)
Potassium: 3.7 mmol/L (ref 3.5–5.1)
Sodium: 140 mmol/L (ref 135–145)

## 2015-07-01 LAB — GLUCOSE, CAPILLARY
GLUCOSE-CAPILLARY: 111 mg/dL — AB (ref 65–99)
GLUCOSE-CAPILLARY: 79 mg/dL (ref 65–99)
GLUCOSE-CAPILLARY: 82 mg/dL (ref 65–99)
Glucose-Capillary: 102 mg/dL — ABNORMAL HIGH (ref 65–99)
Glucose-Capillary: 107 mg/dL — ABNORMAL HIGH (ref 65–99)
Glucose-Capillary: 88 mg/dL (ref 65–99)

## 2015-07-01 LAB — PROCALCITONIN: Procalcitonin: <0.1 ng/mL

## 2015-07-01 MED ORDER — BISACODYL 10 MG RE SUPP: 10.0000 mg | Freq: Every day | RECTAL | Status: DC | PRN

## 2015-07-01 MED ORDER — LORAZEPAM 2 MG/ML IJ SOLN
1.0000 mg | Freq: Every day | INTRAMUSCULAR | Status: DC
  Administered 2015-07-01 – 2015-07-02 (×2): 1 mg via INTRAVENOUS
  Filled 2015-07-01 (×2): qty 1

## 2015-07-01 MED ORDER — ACETAMINOPHEN 650 MG RE SUPP: 650.0000 mg | Freq: Four times a day (QID) | RECTAL | Status: DC | PRN

## 2015-07-01 NOTE — Progress Notes (Addendum)
Subjective: No further seizure activity.    LP completed yesterday shows normal protein, Gram stain negative, 2-3 WBCs. HSV PCR negative  Objective: Current vital signs: BP 90/61 mmHg  Pulse 87  Temp(Src) 100.4 F (38 C) (Core (Comment))  Resp 20  Ht 6' (1.829 m)  Wt 72.8 kg (160 lb 7.9 oz)  BMI 21.76 kg/m2  SpO2 99% Vital signs in last 24 hours: Temp:  [98.6 F (37 C)-100.4 F (38 C)] 100.4 F (38 C) (10/27 0700) Pulse Rate:  [68-94] 87 (10/27 0700) Resp:  [12-28] 20 (10/27 0700) BP: (71-153)/(50-92) 90/61 mmHg (10/27 0700) SpO2:  [93 %-100 %] 99 % (10/27 0700) Weight:  [72.8 kg (160 lb 7.9 oz)] 72.8 kg (160 lb 7.9 oz) (10/27 0500)  Intake/Output from previous day: 10/26 0701 - 10/27 0700 In: 2272.1 [I.V.:1547.1; IV Piggyback:725] Out: 2565 [Urine:2565] Intake/Output this shift:   Nutritional status:    Neurologic Exam: Mental Status:  Extubated, awake, oriented to name only. Able to follow simple commands Cranial Nerves: II: pupils equal round, reactive to light III,IV, VI: ptosis not present, eyes midline, no gaze deviation V,VII: face symmetric,  Motor: Spontaneously moves all extremities symmetrically and against light resistance Sensory: withdrawals to noxious stimuli in all extremities  Lab Results: Basic Metabolic Panel:  Recent Labs Lab 06/27/15 1533 06/28/15 0327 06/29/15 0353 06/30/15 0340 07/01/15 0540  NA 140 141 146* 141 140  K 4.4 3.1* 3.2* 3.5 3.7  CL 107 112* 116* 112* 110  CO2 16* '23 24 23 26  '$ GLUCOSE 188* 125* 104* 109* 102*  BUN '15 14 8 '$ 5* <5*  CREATININE 1.38* 1.15 1.10 0.88 0.90  CALCIUM 9.0 7.9* 7.9* 7.8* 8.2*  MG  --   --  1.8 1.9  --   PHOS  --   --  2.5  --   --     Liver Function Tests:  Recent Labs Lab 06/27/15 1536  AST 31  ALT 10*  ALKPHOS 68  BILITOT 0.6  PROT 7.4  ALBUMIN 4.0   No results for input(s): LIPASE, AMYLASE in the last 168 hours. No results for input(s): AMMONIA in the last 168  hours.  CBC:  Recent Labs Lab 06/27/15 1533 06/28/15 0327 06/29/15 0353 06/30/15 0340 07/01/15 0540  WBC 11.3* 11.0* 8.9 8.3 7.8  NEUTROABS 8.0*  --   --   --   --   HGB 13.9 12.1* 11.8* 10.4* 11.3*  HCT 44.2 36.5* 37.6* 32.1* 34.8*  MCV 90.6 87.1 90.2 89.2 87.4  PLT 226 185 148* 132* 166    Cardiac Enzymes:  Recent Labs Lab 06/27/15 1533 06/27/15 2058 06/27/15 2323 06/30/15 0340 06/30/15 1232  CKTOTAL  --  221  --  703* 1169*  TROPONINI <0.03  --  <0.03  --   --     Lipid Panel: No results for input(s): CHOL, TRIG, HDL, CHOLHDL, VLDL, LDLCALC in the last 168 hours.  CBG:  Recent Labs Lab 06/30/15 1946 06/30/15 2110 07/01/15 0012 07/01/15 0458 07/01/15 0749  GLUCAP 68 71 111* 102* 107*    Microbiology: Results for orders placed or performed during the hospital encounter of 06/27/15  MRSA PCR Screening     Status: None   Collection Time: 06/27/15  7:00 PM  Result Value Ref Range Status   MRSA by PCR NEGATIVE NEGATIVE Final    Comment:        The GeneXpert MRSA Assay (FDA approved for NASAL specimens only), is one component of a comprehensive MRSA  colonization surveillance program. It is not intended to diagnose MRSA infection nor to guide or monitor treatment for MRSA infections.   Culture, blood (x 2)     Status: None (Preliminary result)   Collection Time: 06/27/15 11:20 PM  Result Value Ref Range Status   Specimen Description BLOOD BLOOD LEFT HAND  Final   Special Requests BOTTLES DRAWN AEROBIC ONLY 10CC  Final   Culture   Final    NO GROWTH 2 DAYS Performed at [facility]    Report Status PENDING  Incomplete  Culture, blood (x 2)     Status: None (Preliminary result)   Collection Time: 06/27/15 11:23 PM  Result Value Ref Range Status   Specimen Description BLOOD BLOOD RIGHT ARM  Final   Special Requests BOTTLES DRAWN AEROBIC AND ANAEROBIC 5CC  Final   Culture   Final    NO GROWTH 2 DAYS Performed at [facility]     Report Status PENDING  Incomplete  CSF culture     Status: None (Preliminary result)   Collection Time: 06/29/15 10:46 AM  Result Value Ref Range Status   Specimen Description CSF  Final   Special Requests Normal  Final   Gram Stain   Final    CYTOSPIN WBC PRESENT, PREDOMINANTLY PMN NO ORGANISMS SEEN Gram Stain Report Called to,Read Back By and Verified With: ARNOLD RN AT 1140 ON 10.25.16 BY SHUEA    Culture   Final    NO GROWTH < 24 HOURS Performed at [facility]    Report Status PENDING  Incomplete    Coagulation Studies: No results for input(s): LABPROT, INR in the last 72 hours.  Imaging: Dg Chest Port 1 View  06/30/2015  CLINICAL DATA:  Respiratory failure, history of lung malignancy. EXAM: PORTABLE CHEST 1 VIEW COMPARISON:  Portable chest x-ray of June 29, 2015 FINDINGS: The right lung is well-expanded. There is stable volume loss on the left with left upper lobe mass and pleural thickening atypically. There is no alveolar infiltrate. The interstitial markings are coarse but stable bilaterally. The heart is normal in size. There remains partial obscuration of the left hemidiaphragm. The endotracheal tube has been advanced and its tip now lies 3.9 cm above the carina. The esophagogastric tube tip projects below the inferior margin of the image. IMPRESSION: Interval repositioning of the endotracheal tube such that its tip is now 4 cm above the carina. There is no significant interval change in the appearance of the chest. Electronically Signed   By: David  Martinique M.D.   On: 06/30/2015 07:03   Dg Chest Port 1 View  06/29/2015  CLINICAL DATA:  Endotracheal tube replacement.  Initial encounter. EXAM: PORTABLE CHEST 1 VIEW COMPARISON:  Radiograph performed earlier today at 4:50 a.m. FINDINGS: The endotracheal tube is seen ending 8 cm above the carina. This could be advanced 4-5 cm. The enteric tube is seen extending below the diaphragm. The left upper lobe mass, hilar  retraction and apical opacification are relatively stable. Right basilar airspace opacity may reflect asymmetric pulmonary edema or pneumonia. No pleural effusion or pneumothorax is seen. The cardiomediastinal silhouette is borderline normal in size. No acute osseous abnormalities are identified. IMPRESSION: 1. Endotracheal tube seen ending 8 cm above the carina. This could be advanced 4-5 cm. 2. Right basilar airspace opacity may reflect asymmetric pulmonary edema or pneumonia. 3. Left upper lobe mass, hilar retraction and apical opacification are relatively stable in appearance. These results were called by telephone at the  time of interpretation on 06/29/2015 at 11:37 pm to Nursing in the [facility], who verbally acknowledged these results. Electronically Signed   By: Garald Balding M.D.   On: 06/29/2015 23:41    Medications:  Scheduled: . antiseptic oral rinse  7 mL Mouth Rinse QID  . chlorhexidine gluconate  15 mL Mouth Rinse BID  . donepezil  10 mg Oral QHS  . enoxaparin (LOVENOX) injection  40 mg Subcutaneous Q24H  . famotidine (PEPCID) IV  20 mg Intravenous Q12H  . fluticasone  2 spray Each Nare Daily  . lacosamide (VIMPAT) IV  100 mg Intravenous Q12H  . levETIRAcetam  1,500 mg Intravenous Q12H  . LORazepam  2 mg Oral QHS  . piperacillin-tazobactam (ZOSYN)  IV  3.375 g Intravenous Q8H  . risperiDONE  3 mg Oral QHS  . traZODone  25 mg Oral QHS  . vancomycin  500 mg Intravenous Q12H    Assessment/Plan:  64y/o hx of seizures, developmental disability, lung CA presenting with breakthrough seizures. Intubated in ED for airway protection. Unclear etiology of breakthrough seizures. LP results unremarkable.    -continue keppra '1500mg'$  BID -continue vimpat '100mg'$  BID  -can transition AEDs to oral when cleared for PO intake -follow up with outpatient neurologist upon discharge.  -please call with further questions     LOS: 4 days   Jim Like,  DO [facility] [phone]  If 7pm- 7am, please page neurology on call as listed in [place]. 07/01/2015  9:29 AM

## 2015-07-01 NOTE — Care Management Important Message (Signed)
Important Message  Patient Details  Name: Marco [name] MRN: [id number] Date of Birth: [birth year]   Medicare Important Message Given:  Yes-second notification given    Shelda Altes 07/01/2015, 3:03 [place] Message  Patient Details  Name: Marco [name] MRN: [id number] Date of Birth: 11-13-1950   Medicare Important Message Given:  Yes-second notification given    Shelda Altes 07/01/2015, 3:03 PM

## 2015-07-01 NOTE — Progress Notes (Signed)
Date: July 01, 2015 Chart reviewed for concurrent status and case management needs. Will continue to follow patient for changes and needs: Remains on [employer], [place], [place], [place]   [phone]

## 2015-07-01 NOTE — Evaluation (Signed)
Physical Therapy Evaluation Patient Details Name: Marco [name] MRN: [id number] DOB: 1951/05/08 Today's Date: 07/01/2015   History of Present Illness  64 yo male admitted with Sz, respiratory failure requiring intubation, LP 10/25. Extubated 10/26. Hx of developmental disability, Sz d/o, dementia, HTN, lung cancer. Pt is from a group home.  Clinical Impression  On eval, pt required Min assist +2 for safety for mobility-walked ~500 feet with (once with walker, once without). Pt moves quickly and is unsteady. LOB several times during ambulation requiring external assist to prevent fall. Pt required Min assist both with walker use and without. At this time, recommend HHPT at group home as long as pt will have 24 hour supervision/assist for ADLs/mobility. If not, may need to consider SNF placement.     Follow Up Recommendations Home health PT;Supervision/Assistance - 24 hour (at group home as long as facility can provide current level of care and 24 hour assist)    Equipment Recommendations   (to be determined)    Recommendations for Other Services       Precautions / Restrictions Precautions Precautions: Fall Precaution Comments: sz Restrictions Weight Bearing Restrictions: No      Mobility  Bed Mobility Overal bed mobility: Needs Assistance Bed Mobility: Supine to Sit;Sit to Supine     Supine to sit: Supervision Sit to supine: Supervision   General bed mobility comments: for safety, lines  Transfers Overall transfer level: Needs assistance   Transfers: Sit to/from Stand Sit to Stand: Supervision         General transfer comment: close supervision for safety.   Ambulation/Gait Ambulation/Gait assistance: Min assist;+2 safety/equipment Ambulation Distance (Feet): 500 Feet Assistive device: None;Rolling walker (2 wheeled) Gait Pattern/deviations: Step-through pattern;Decreased stride length;Drifts right/left;Staggering right;Staggering left     General Gait  Details: 250 feet without device, 250 feet with RW. Required Min assist both trials. Unsteady and assist needed to steer/maneuver walker properly.   Stairs            Wheelchair Mobility    Modified Rankin (Stroke Patients Only)       Balance Overall balance assessment: Needs assistance         Standing balance support: During functional activity Standing balance-Leahy Scale: Fair                               Pertinent Vitals/Pain Pain Assessment: No/denies pain    Home Living Family/patient expects to be discharged to:: Group home Living Arrangements: Group Home                    Prior Function Level of Independence: Needs assistance         Comments: patient had supervision/ assist for higher cognitive tasks, but was able to mobilize and perform various aspects of self care independently (from previous note)     Hand Dominance        Extremity/Trunk Assessment   Upper Extremity Assessment: Overall WFL for tasks assessed           Lower Extremity Assessment: Generalized weakness      Cervical / Trunk Assessment: Normal  Communication   Communication: Expressive difficulties  Cognition Arousal/Alertness: Awake/alert Behavior During Therapy: Impulsive Overall Cognitive Status: History of cognitive impairments - at baseline                      General Comments      Exercises  Assessment/Plan    PT Assessment Patient needs continued PT services  PT Diagnosis Difficulty walking;Generalized weakness   PT Problem List Decreased strength;Decreased balance;Decreased mobility;Decreased safety awareness  PT Treatment Interventions Gait training;Functional mobility training;Therapeutic activities;Patient/family education;Balance training;Therapeutic exercise   PT Goals (Current goals can be found in the Care Plan section) Acute Rehab PT Goals Patient Stated Goal: home PT Goal Formulation: Patient unable to  participate in goal setting Time For Goal Achievement: 07/15/15 Potential to Achieve Goals: Good    Frequency Min 3X/week   Barriers to discharge        Co-evaluation               End of Session   Activity Tolerance: Patient tolerated treatment well Patient left: with call bell/phone within reach;with bed alarm set           Time: 1436-1500 PT Time Calculation (min) (ACUTE ONLY): 24 min   Charges:   PT Evaluation $Initial PT Evaluation Tier I: 1 Procedure     PT G Codes:        Weston Anna, MPT Pager: [phone]

## 2015-07-01 NOTE — Progress Notes (Addendum)
Patient ID: Marco [name], male   DOB: Mar 23, 1951, 64 y.o.   MRN: [id number] [facility] PROGRESS NOTE  Marco [name] MOL:[id number] DOB: Feb 13, 1951 DOA: 06/27/2015 PCP: Leamon Arnt, MD  Brief narrative:    64 year old male who resides in group home with past medical history of lung cancer, seizures who presented to [facility] ED after having 3 witnessed tonic-clonic seizures at group home. In ED, pt required intubation for airway protection.   SIGNIFICANT EVENTS: 10/23 Admit to ICU on ventilator 10/25 Fever tmax 101.1, failed SBT due to sedation  2023-07-17 Extubated to [place] O2   Assessment/Plan:    Principal Problem:  Acute respiratory failure with hypoxia (HCC) / VDRF in the setting of  Seizures - Pt intubated on the admission for airway protection - Extubated July 17, 2023 - Respiratory status stable  Active Problems: Convulsions/seizures ([place]) - Appreciate neurology following - LP done and results unremarkable - Continue keppra 1570m BID - Continue vimpat 1027mBID   [place]/ UTI (urinary tract infection) / Leukocytosis  - Sepsis criteria met. 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 Currently on Immunotherapy with Nivolumab 3 mg/kg given every 2 weeks. Status post 6 cycles  Acute renal failure / High AG metabolic acidosis  - Likely due to rhabdomyolysis in setting seizure. - Resolved with hydration   Hypokalemia - Due to acute renal failure - Supplemented and now NWL  Hypernatremia - Due to sepsis,  dehydration - Corrected with IV fluids    DVT Prophylaxis  - Lovenox subQ   Code Status: Full.  Family Communication:  plan of care discussed with the patient, family not at the bedside  Disposition Plan: remains in SDU considering ongoing sepsis   IV access:  Peripheral IV  Procedures and diagnostic studies:    Dg Chest Port 1 View 10November 12, 2016Interval repositioning of the endotracheal tube such that its tip is now 4 cm above the carina. There is no significant interval change in the appearance of the chest. Electronically Signed   By: David  JoMartinique.D.   On: [id number]:03   Dg Chest Port 1 View 06/29/2015 1. Endotracheal tube seen ending 8 cm above the carina. This could be advanced 4-5 cm. 2. Right basilar airspace opacity may reflect asymmetric pulmonary edema or pneumonia. 3. Left upper lobe mass, hilar retraction and apical opacification are relatively stable in appearance. These results were called by telephone at the time of interpretation on 06/29/2015 at 11:37 pm [facility] these results. Electronically Signed   By: JeGarald Balding.D.   On: 06/29/2015 23:41   Dg Chest Port 1 View 06/29/2015   1. Left upper lobe mass and associated treatment changes in the apical left hemi thorax. 2. Bibasilar airspace opacification, possibly due to an infectious bronchiolitis or bronchopneumonia. Electronically Signed   By: MeLorin Picket.D.   On: 06/29/2015 07:20   Ct Head Wo Contrast 06/27/2015 Mild diffuse cortical atrophy. No acute intracranial abnormality seen. Multilevel degenerative disc  disease. No acute abnormality seen in the cervical spine. Electronically Signed   By: Marijo Conception, M.D.   On: 06/27/2015 17:37   Ct Cervical Spine Wo Contrast 06/27/2015 Mild diffuse cortical atrophy. No acute intracranial abnormality seen. Multilevel degenerative disc disease. No acute abnormality seen in the cervical spine. Electronically  Signed   By: Marijo Conception, M.D.   On: 06/27/2015 17:37   Dg Chest Portable 1 View 06/27/2015  Endotracheal tube in grossly good position. Left upper lobe mass is significantly smaller compared to prior exam. Electronically Signed   By: Marijo Conception, M.D.   On: 06/27/2015 15:55   Medical Consultants:  Neurology  Other Consultants:  PT SLP  IAnti-Infectives:      Leisa Lenz, MD  [facility] Pager [phone]  Time spent in minutes: 25 minutes  If 7PM-7AM, please contact night-coverage [email or web address] Password [email or web address] 07/01/2015, 7:25 PM   LOS: 4 days    HPI/Subjective: No acute overnight events. No seizures.   Objective: Filed Vitals:   07/01/15 1200 07/01/15 1600 07/01/15 1710 07/01/15 1800  BP: 114/96  157/106 136/105  Pulse: 79 104 115 104  Temp: 99.9 F (37.7 C) 97.3 F (36.3 C)    TempSrc:  Oral    Resp: 15 18 39 19  Height:      Weight:      SpO2: 98% 99% 96% 97%    Intake/Output Summary (Last 24 hours) at 07/01/15 1925 Last data filed at 07/01/15 1602  Gross per 24 hour  Intake   1985 ml  Output   2050 ml  Net    -65 ml    Exam:   General:  Pt is alert, not in acute distress  Cardiovascular: Regular rate and rhythm, S1/S2 (+)  Respiratory: Clear to auscultation bilaterally, no wheezing, no crackles, no rhonchi  Abdomen: Soft, non tender, non distended, bowel sounds present  Extremities: No edema, pulses DP and PT palpable bilaterally  Neuro: Grossly nonfocal  Data Reviewed: Basic Metabolic Panel:  Recent Labs Lab 06/27/15 1533 06/28/15 0327 06/29/15 0353 06/30/15 0340 07/01/15 0540  NA 140 141 146* 141 140  K 4.4 3.1* 3.2* 3.5 3.7  CL 107 112* 116* 112* 110  CO2 16* _0 GLUCOSE 188* 125* 104* 109* 102*  BUN _1 5* <5*  CREATININE 1.38* 1.15 1.10 0.88 0.90  CALCIUM 9.0 7.9* 7.9* 7.8* 8.2*  MG  --   --  1.8 1.9  --   PHOS  --   --  2.5  --   --    Liver Function Tests:  Recent Labs Lab 06/27/15 1536   AST 31  ALT 10*  ALKPHOS 68  BILITOT 0.6  PROT 7.4  ALBUMIN 4.0   No results for input(s): LIPASE, AMYLASE in the last 168 hours. No results for input(s): AMMONIA in the last 168 hours. CBC:  Recent Labs Lab 06/27/15 1533 06/28/15 0327 06/29/15 0353 06/30/15 0340 07/01/15 0540  WBC 11.3* 11.0* 8.9 8.3 7.8  NEUTROABS 8.0*  --   --   --   --   HGB 13.9 12.1* 11.8* 10.4* 11.3*  HCT 44.2 36.5* 37.6* 32.1* 34.8*  MCV 90.6 87.1 90.2 89.2 87.4  PLT 226 185 148* 132* 166   Cardiac Enzymes:  Recent Labs Lab 06/27/15 1533 06/27/15 2058 06/27/15 2323 06/30/15 0340 06/30/15 1232  CKTOTAL  --  221  --  703* 1169*  TROPONINI <0.03  --  <0.03  --   --  BNP: Invalid input(s): POCBNP CBG:  Recent Labs Lab 07/01/15 0012 07/01/15 0458 07/01/15 0749 07/01/15 1143 07/01/15 1531  GLUCAP 111* 102* 107* 88 82    MRSA PCR Screening     Status: None   Collection Time: 06/27/15  7:00 PM  Result Value Ref Range Status   MRSA by PCR NEGATIVE NEGATIVE Final  Culture, blood (x 2)     Status: None (Preliminary result)   Collection Time: 06/27/15 11:20 PM  Result Value Ref Range Status   Specimen Description BLOOD BLOOD LEFT HAND  Final   Special Requests BOTTLES DRAWN AEROBIC ONLY 10CC  Final   Culture   Final    NO GROWTH 3 DAYS Performed at [facility]    Report Status PENDING  Incomplete  Culture, blood (x 2)     Status: None (Preliminary result)   Collection Time: 06/27/15 11:23 PM  Result Value Ref Range Status   Specimen Description BLOOD BLOOD RIGHT ARM  Final   Special Requests BOTTLES DRAWN AEROBIC AND ANAEROBIC 5CC  Final   Culture   Final    NO GROWTH 3 DAYS Performed at [facility]    Report Status PENDING  Incomplete  CSF culture     Status: None (Preliminary result)   Collection Time: 06/29/15 10:46 AM  Result Value Ref Range Status   Specimen Description CSF  Final   Special Requests Normal  Final   Gram Stain   Final   Culture    Final    NO GROWTH 2 DAYS Performed at [facility]    Report Status PENDING  Incomplete     Scheduled Meds: . antiseptic oral rinse  7 mL Mouth Rinse QID  . chlorhexidine gluconate  15 mL Mouth Rinse BID  . donepezil  10 mg Oral QHS  . enoxaparin (LOVENOX) injection  40 mg Subcutaneous Q24H  . famotidine (PEPCID) IV  20 mg Intravenous Q12H  . fluticasone  2 spray Each Nare Daily  . lacosamide (VIMPAT) IV  100 mg Intravenous Q12H  . levETIRAcetam  1,500 mg Intravenous Q12H  . LORazepam  2 mg Oral QHS  . piperacillin-tazobactam (ZOSYN)  IV  3.375 g Intravenous Q8H  . risperiDONE  3 mg Oral QHS  . traZODone  25 mg Oral QHS  . vancomycin  500 mg Intravenous Q12H   Continuous Infusions: . dextrose 5 % and 0.9% NaCl 50 mL/hr at 07/01/15 1602

## 2015-07-01 NOTE — Evaluation (Signed)
Clinical/Bedside Swallow Evaluation Patient Details  Name: Marco [name] MRN: [id number] Date of Birth: 1951-05-18  Today's Date: 07/01/2015 Time: SLP Start Time (ACUTE ONLY): 32 SLP Stop Time (ACUTE ONLY): 1530 SLP Time Calculation (min) (ACUTE ONLY): 25 min  Past Medical History:  Past Medical History  Diagnosis Date  . Mental disorder     sczizophrenia;moderate retardation  . Depression   . DEMENTIA   . Hypertension     no medications, no documented history per caregiver at preadmission  . GERD (gastroesophageal reflux disease)   . Seizures ([place])   . Developmental disability     developmentaly delayed  . Hx of radiation therapy 11/07/13-12/24/13    lung,60Gy/41f  . Mental retardation   . [place]   . Lung cancer ([address]   . Encounter for antineoplastic immunotherapy 04/05/2015   Past Surgical History:  Past Surgical History  Procedure Laterality Date  . Cataract extraction w/phaco  07/12/2011    Procedure: CATARACT EXTRACTION PHACO AND INTRAOCULAR LENS PLACEMENT (IOC);  Surgeon: GAdonis Brook  [facility];  Service: Ophthalmology;  Laterality: Left;  .Marland KitchenEye surgery      L eye  . Video bronchoscopy Bilateral 10/01/2013    Procedure: VIDEO BRONCHOSCOPY WITHOUT FLUORO;  Surgeon: RCollene Gobble MD;  [facility]  Service: Cardiopulmonary;  Laterality: Bilateral;   HPI:  64 Y/O with PMH of seizures, dementia, intellectual disability, lung cancer x/p chemo-XRT 12/2013, presents with breakthrough seizures. He was recently admitted in august with status epilepticus. He is intubated in ED for resp protection.  Pt intubated from 10/23-10/26.  Imaging of chest showed right base opacity- ? edema vs pna, upper left mass.  Pt does have h/o silent aspiration of thin liquids prior to and after swallow on MBS conducted 10/2014 when pt admitted for seizures.  Recomendations were for dys2/nectar diet at that time.     Assessment / Plan / Recommendation Clinical  Impression  Pt presents wtih oral dysphagia due to intellectual deficits and concerns for pharyngeal deficits with possible aspiration.  Poor secretion management noted characterized by pt drooling while walking with PT (per rehab tech).  Pt able to follow simple directions and has a strong cough. He was leaning to the right requiring repositioning and propping with pillow.  Pt only given tsps of applesauce and nectar thick liquid.  Suspected delay oral transiting with lingual discoordination.  Pharyngeal swallow likely delayed with questionable weakness characterized by 4 swallows across each bolus.  Delayed coughing noted - ? aspiration of secretions and/or residuals.  Pt's sister reports pt does cough with liquids at times and he is on a soft/thin diet at facility, concern for baseline aspiration.   Also question possible impact of XRT for lung cancer on swallow ability.  As pt has a h/o dysphagia with silent aspiration on MBS in February 2016 and presents with symptoms currently, rec NPO with oral moisture via toothette and MBS next date.  Informed sister (Malachy Mood to plan and results of prior MBS.  Sister and RN both in agreement to plan.      Aspiration Risk  Severe    Diet Recommendation NPO   Medication Administration: Via alternative means    Other  Recommendations Oral Care Recommendations: Oral care QID   Follow Up Recommendations    n/a   Frequency and Duration Other (Comment) (TBD pending MBS)      Pertinent Vitals/Pain Febrile, decreased     Swallow Study Prior Functional Status   see  hhx    General Date of Onset: 07/01/15 Other Pertinent Information: 64 Y/O with PMH of seizures, dementia, intellectual disability, lung cancer x/p chemo-XRT 12/2013, presents with breakthrough seizures. He was recently admitted in august with status epilepticus. He is intubated in ED for resp protection.  Pt intubated from 10/23-10/26.  Imaging of chest showed right base opacity- ? edema vs pna,  upper left mass.  Pt does have h/o silent aspiration of thin liquids prior to and after swallow on MBS conducted 10/2014 when pt admitted for seizures.  Recomendations were for dys2/nectar diet at that time.   Type of Study: Bedside swallow evaluation Diet Prior to this Study: NPO;IV Temperature Spikes Noted: Yes (low grade) Respiratory Status: Room air History of Recent Intubation: Yes Length of Intubations (days): 4 days Date extubated: 07/01/15 Behavior/Cognition: Alert;Cooperative (pt with intellectual disability) Oral Cavity - Dentition: Other (Comment) (2 upper teeth only) Self-Feeding Abilities: Needs assist Patient Positioning: Upright in bed (pt tends to lean right) Baseline Vocal Quality: Normal Volitional Cough: Strong Volitional Swallow: Unable to elicit    Oral/Motor/Sensory Function Overall Oral Motor/Sensory Function:  (decreased lingual and labial ROM- most notably on right, bilateral palatal elevation) Labial ROM: Reduced right Labial Strength: Reduced Lingual ROM: Reduced right Lingual Strength: Reduced Velum: Within Functional Limits   Ice Chips Ice chips: Impaired Presentation: Spoon Oral Phase Impairments: Reduced lingual movement/coordination;Impaired anterior to posterior transit Pharyngeal Phase Impairments: Suspected delayed Swallow;Other (comments) (multiple swallows)   Thin Liquid Thin Liquid: Not tested    Nectar Thick Nectar Thick Liquid: Impaired Presentation: Spoon Oral Phase Impairments: Reduced lingual movement/coordination;Impaired anterior to posterior transit Oral phase functional implications: Prolonged oral transit Pharyngeal Phase Impairments: Multiple swallows;Cough - Delayed Other Comments: multiple swallows x4 with tsp of intake, delayed cough concerning for aspiration   Honey Thick Honey Thick Liquid: Not tested   Puree Puree: Impaired Presentation: Spoon;Self Fed Oral Phase Impairments: Impaired anterior to posterior transit;Reduced  lingual movement/coordination Oral Phase Functional Implications: Prolonged oral transit Pharyngeal Phase Impairments: Suspected delayed Swallow;Multiple swallows Other Comments: swallow x4 with tsp of applesauce concerning for pharyngeal residuals   Solid   GO    Solid: Not tested       Luanna Salk, [place] [facility] SLP [phone]

## 2015-07-02 ENCOUNTER — Inpatient Hospital Stay (HOSPITAL_COMMUNITY): Payer: Medicare Other

## 2015-07-02 DIAGNOSIS — F79 Unspecified intellectual disabilities: Secondary | ICD-10-CM

## 2015-07-02 DIAGNOSIS — F0390 Unspecified dementia without behavioral disturbance: Secondary | ICD-10-CM | POA: Diagnosis not present

## 2015-07-02 DIAGNOSIS — R05 Cough: Secondary | ICD-10-CM | POA: Diagnosis not present

## 2015-07-02 DIAGNOSIS — R1310 Dysphagia, unspecified: Secondary | ICD-10-CM

## 2015-07-02 DIAGNOSIS — R509 Fever, unspecified: Secondary | ICD-10-CM

## 2015-07-02 DIAGNOSIS — N179 Acute kidney failure, unspecified: Secondary | ICD-10-CM | POA: Diagnosis not present

## 2015-07-02 DIAGNOSIS — R569 Unspecified convulsions: Secondary | ICD-10-CM | POA: Diagnosis not present

## 2015-07-02 DIAGNOSIS — J9601 Acute respiratory failure with hypoxia: Secondary | ICD-10-CM | POA: Diagnosis not present

## 2015-07-02 DIAGNOSIS — G40901 Epilepsy, unspecified, not intractable, with status epilepticus: Secondary | ICD-10-CM | POA: Diagnosis not present

## 2015-07-02 DIAGNOSIS — E440 Moderate protein-calorie malnutrition: Secondary | ICD-10-CM | POA: Insufficient documentation

## 2015-07-02 DIAGNOSIS — G40909 Epilepsy, unspecified, not intractable, without status epilepticus: Secondary | ICD-10-CM | POA: Diagnosis not present

## 2015-07-02 LAB — GLUCOSE, CAPILLARY
GLUCOSE-CAPILLARY: 118 mg/dL — AB (ref 65–99)
GLUCOSE-CAPILLARY: 93 mg/dL (ref 65–99)
GLUCOSE-CAPILLARY: 90 mg/dL (ref 65–99)
Glucose-Capillary: 71 mg/dL (ref 65–99)
Glucose-Capillary: 86 mg/dL (ref 65–99)
Glucose-Capillary: 88 mg/dL (ref 65–99)

## 2015-07-02 LAB — CSF CULTURE W GRAM STAIN
Culture: NO GROWTH
Special Requests: NORMAL

## 2015-07-02 LAB — PROCALCITONIN: Procalcitonin: <0.1 ng/mL

## 2015-07-02 MED ORDER — DEXTROSE 50 % IV SOLN
INTRAVENOUS | Status: AC
  Administered 2015-07-02: 08:00:00
  Filled 2015-07-02: qty 50

## 2015-07-02 MED ORDER — RESOURCE THICKENUP CLEAR PO POWD
ORAL | Status: DC | PRN
  Filled 2015-07-02: qty 125

## 2015-07-02 MED ORDER — LORAZEPAM 2 MG/ML IJ SOLN
0.5000 mg | Freq: Once | INTRAMUSCULAR | Status: DC
  Filled 2015-07-02: qty 1

## 2015-07-02 NOTE — Progress Notes (Signed)
Nutrition Follow-up  DOCUMENTATION CODES:   Non-severe (moderate) malnutrition in context of acute illness/injury  INTERVENTION:  - If diet unable to be advanced following MBS today, recommend Jevity 1.5 @ 60 mL/hr to provide 2160 kcal, 92 grams protein, and 1094 mL free water - RD will continue to monitor for needs  NUTRITION DIAGNOSIS:   Inadequate oral intake related to inability to eat as evidenced by NPO status. -ongoing  GOAL:   Patient will meet greater than or equal to 90% of their needs -unmet  MONITOR:   Diet advancement, Weight trends, Labs, Skin, I & O's  ASSESSMENT:   64 year old male with history of seizure presented to [facility] ED 10/23 after having 3 witnessed tonic-clonic seizures at group home where he resides. In ED he required intubation for airway protection.   10/28 Pt extubated 10/26 PM; needs re-estimated based on this. Pt seen by SLP yesterday (10/27) who continues to recommend NPO status. MBS is scheduled for noon today; will monitor for results. TF recommendations outlined above should pt be unsafe for POs.   No family present and pt unable to answer questions. Based on chart review, pt lost 8 lbs (5.5% body weight) from 05/21/15-06/08/15 which is significant for time frame. Mild muscle wasting noted to upper body and lower body not assessed.   Not meeting needs. Medications reviewed. Labs reviewed; CBGs: 71-118 mg/dL, BUN <5.   10/24 - No family present in room. EEG in progress.  - Will complete nutrition focused physical exam at follow-up. - Patient is currently intubated on ventilator support - MV: 10.8 L/min; Propofol: 16.9 ml/hr -> provides 446 fat kcal   Diet Order:   NPO  Skin:  Reviewed, no issues  Last BM:  10/27  Height:   Ht Readings from Last 1 Encounters:  06/27/15 6' (1.829 m)    Weight:   Wt Readings from Last 1 Encounters:  07/02/15 155 lb 6.8 oz (70.5 kg)    Ideal Body Weight:  80.9 kg  BMI:  Body mass index is 21.07  kg/(m^2).  Estimated Nutritional Needs:   Kcal:  2100-2300  Protein:  80-90 grams  Fluid:  1.7L/day  EDUCATION NEEDS:   No education needs identified at this time     Jarome Matin, RD, LDN Inpatient Clinical Dietitian Pager # [phone] After hours/weekend pager # [phone]

## 2015-07-02 NOTE — Consult Note (Addendum)
[place] for Infectious Disease       Reason for Consult: fever    Referring Physician: Dr. 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 antiseptic oral rinse  7 mL Mouth Rinse QID  . chlorhexidine gluconate  15 mL Mouth Rinse BID  . donepezil  10 mg Oral QHS  . enoxaparin (LOVENOX) injection  40 mg Subcutaneous Q24H  . famotidine (PEPCID) IV  20 mg Intravenous Q12H  . fluticasone  2 spray Each Nare Daily  . lacosamide (VIMPAT) IV  100 mg Intravenous Q12H  . levETIRAcetam  1,500 mg Intravenous Q12H  . LORazepam  0.5 mg Intravenous Once  . LORazepam  1 mg Intravenous QHS  . risperiDONE  3 mg Oral QHS  . traZODone  25 mg Oral QHS    Recommendations: Will stop antibiotics and observe  Consider scheduled NSAIDS if fever returns for tumor fever  Assessment: He has a fever of unknown source.  Last fever about 24 hours ago.  No localizing symptoms and work up unrevealing.  He came in with seizures.  Differential includes viral infection, tumor fever, drug fever.  No signs of bacterial infection with normal procalcitonin, negative cultures.    Also has a cough, stable, little productive sputum.   Dysphagia.   Leukocytosis resolved. Likely related to seizure.   Dr. Johnnye Sima is available over the weekend if needed.   Antibiotics: Vancomycin and zosyn day 6  HPI: MAJESTY OEHLERT is a 64 y.o. male with MR, on immunotherapy for lung cancer, seizure disorder who presented on 10/23 with 3 witnessed seizures at his group home.  He was intubated in the field and brought in.  He was started on vancomycin and zosyn after noting an elevated WBC to 11 and  temperature to 102.  Work up included an LP and only 3 WBCs, HSV negative.  Did receive 2 days of acyclovir empirically.  Lactate initially high after the seizure but normalized.  Procalcitonin remained < 0.25.  He continued to have a fever about once per day, trending down and last fever yesterday am at 100.8.  Cultures remained negative.   CXR independently reviewed and no opacity.  Previous hospitalization reviewed and MRI without acute process/mass.    Review of Systems:  Constitutional: negative for chills and fatigue Respiratory: negative for wheezing, dyspnea on exertion or worsening cough Gastrointestinal: negative for nausea, vomiting and diarrhea All other systems reviewed and are negative   Past Medical History  Diagnosis Date  . Mental disorder     sczizophrenia;moderate retardation  . Depression   . DEMENTIA   . Hypertension     no medications, no documented history per caregiver at preadmission  . GERD (gastroesophageal reflux disease)   . Seizures ([place])   . Developmental disability     developmentaly delayed  . Hx of radiation therapy 11/07/13-12/24/13    lung,60Gy/79f  . Mental retardation   . Seizure disorder ([place]   . [place] cancer [place]   . Encounter for antineoplastic immunotherapy 04/05/2015    Social History  Substance Use Topics  . Smoking status: Former Smoker -- 0.50 packs/day for 35 years  Types: Cigarettes    Quit date: 09/07/2013  . Smokeless tobacco: None  . Alcohol Use: No    Family History  Problem Relation Age of Onset  . Hypertension Mother   . Hypertension Father     No Known Allergies  Physical Exam: Constitutional: in no apparent distress and alert  Filed Vitals:   07/02/15 1200  BP: 157/73  Pulse: 57  Temp:   Resp: 21   EYES: anicteric ENMT: Cardiovascular: Cor RRR and No murmurs Respiratory: clear; GI: Bowel sounds are normal, liver is not enlarged, spleen is not enlarged Musculoskeletal: peripheral pulses normal, no pedal  edema, no clubbing or cyanosis Skin: negatives: no rashes or suspicious lesions Hematologic: no lymph nodes  Lab Results  Component Value Date   WBC 7.8 07/01/2015   HGB 11.3* 07/01/2015   HCT 34.8* 07/01/2015   MCV 87.4 07/01/2015   PLT 166 07/01/2015    Lab Results  Component Value Date   CREATININE 0.90 07/01/2015   BUN <5* 07/01/2015   NA 140 07/01/2015   K 3.7 07/01/2015   CL 110 07/01/2015   CO2 26 07/01/2015    Lab Results  Component Value Date   ALT 10* 06/27/2015   AST 31 06/27/2015   ALKPHOS 68 06/27/2015     Microbiology: Recent Results (from the past 240 hour(s))  MRSA PCR Screening     Status: None   Collection Time: 06/27/15  7:00 PM  Result Value Ref Range Status   MRSA by PCR NEGATIVE NEGATIVE Final    Comment:        The GeneXpert MRSA Assay (FDA approved for NASAL specimens only), is one component of a comprehensive MRSA colonization surveillance program. It is not intended to diagnose MRSA infection nor to guide or monitor treatment for MRSA infections.   Culture, blood (x 2)     Status: None (Preliminary result)   Collection Time: 06/27/15 11:20 PM  Result Value Ref Range Status   Specimen Description BLOOD BLOOD LEFT HAND  Final   Special Requests BOTTLES DRAWN AEROBIC ONLY 10CC  Final   Culture   Final    NO GROWTH 3 DAYS Performed at [facility]    Report Status PENDING  Incomplete  Culture, blood (x 2)     Status: None (Preliminary result)   Collection Time: 06/27/15 11:23 PM  Result Value Ref Range Status   Specimen Description BLOOD BLOOD RIGHT ARM  Final   Special Requests BOTTLES DRAWN AEROBIC AND ANAEROBIC 5CC  Final   Culture   Final    NO GROWTH 3 DAYS Performed at [facility]    Report Status PENDING  Incomplete  CSF culture     Status: None   Collection Time: 06/29/15 10:46 AM  Result Value Ref Range Status   Specimen Description CSF  Final   Special Requests Normal  Final   Gram Stain   Final     CYTOSPIN WBC PRESENT, PREDOMINANTLY PMN NO ORGANISMS SEEN Gram Stain Report Called to,Read Back By and Verified With: ARNOLD RN AT 1140 ON 10.25.16 BY SHUEA    Culture   Final    NO GROWTH 3 DAYS Performed at [facility]    Report Status 07/02/2015 FINAL  Final    Scharlene Gloss, [place] for Infectious Disease [facility] [email or web address] [phone] pager  [phone] cell 07/02/2015, 12:09 PM

## 2015-07-02 NOTE — Progress Notes (Addendum)
Patient ID: Marco [name], male   DOB: 10/17/1950, 64 y.o.   MRN: [id number] [facility] PROGRESS NOTE  Marco [name] XBJ:[id number] DOB: Sep 23, 1950 DOA: 06/27/2015 PCP: Marco Arnt, MD  Brief narrative:    64 year old male who resides in group home with past medical history of lung cancer, seizures who presented to [facility] ED after having 3 witnessed tonic-clonic seizures at group home. In ED, pt required intubation for airway protection.   SIGNIFICANT EVENTS: 10/23 Admit to ICU on ventilator 10/25 Fever tmax 101.1, failed SBT due to sedation  05-Jul-2023 Extubated to [place] O2 10/28 Continues to spike fever, ID consulted    Assessment/Plan:    Principal Problem:  Acute respiratory failure with hypoxia ([place]) / VDRF in the setting of  Seizures - Pt intubated on the admission for airway protection - Pt was under PCCM car through 2023-07-05. Pt extubated 07-05-2023. [facility] assumed care 10/27 - Respiratory status remains stable  Active Problems: Convulsions/seizures ([place]) - Intubated on admission for airway protection - No further episodes of seizures reported - LP done and results unremarkable - Appreciate neurology following and their recommendations  - Continue keppra 1500 mg BID and vimpat 100 mg BID   Sepsis ([place]) / UTI (urinary tract infection) / Leukocytosis  - Sepsis criteria met. Unclear source of infection although suspect UTI considering everything else unremarkable. UA on admission with many bacteria and possible source of infection. ? febrile seizure - Blood cultures so far negative. - LP fluid analysis unremarkable. - Pt on vanco and zosyn (Vanco, start date, 10/24, Zosyn, start date, 10/24, Acyclovir, empiric, start date 10/25 >> Jul 05, 2023) - ID consulted, appreciate their input.  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 Currently on Immunotherapy with Nivolumab 3 mg/kg given every 2 weeks. Status post 6 cycles  Acute renal failure / High AG metabolic acidosis  - Likely due to rhabdomyolysis in the setting of seizure. - Resolved with hydration   Hypokalemia - Secondary to sepsis and acute renal failure - Supplemented   Hypernatremia - Secondary to sepsis, dehydration - Normalized with IV fluids   Malnutrition of moderate degree - Nutrition consulted   DVT Prophylaxis  - Lovenox subQ in hospital   Code Status: Full.  Family Communication:  plan of care discussed with the patient, family not at the bedside  Disposition Plan: remains in SDU because of ongoing fever, tachycardia, tachypnea. Monitor respiratory status.   IV access:  Peripheral IV  Procedures and diagnostic studies:    Dg Chest Port 1 View 2015/07/05  Interval repositioning of the endotracheal tube such that its tip is now 4 cm above the carina. There is no significant interval change in the appearance of the chest. Electronically Signed   By: Marco  [name] M.D.   On: 07-05-15 07:03   Dg Chest Port 1 View 06/29/2015 1. Endotracheal tube seen ending 8 cm above the carina. This could be advanced 4-5 cm. 2. Right basilar airspace opacity may reflect asymmetric pulmonary edema or pneumonia. 3. Left upper lobe mass, hilar retraction and apical opacification are relatively stable in appearance. These results were called by telephone at the time of interpretation on 06/29/2015 at 11:37 pm to Nursing in the [facility], who verbally acknowledged these results. Electronically Signed  By: Marco [name] M.D.   On: 06/29/2015 23:41   Dg Chest Port 1 View 06/29/2015   1. Left upper lobe mass and associated treatment changes in the apical left hemi thorax. 2. Bibasilar airspace opacification, possibly due to an infectious bronchiolitis or bronchopneumonia. Electronically Signed    By: Marco [name] M.D.   On: 06/29/2015 07:20   Ct Head Wo Contrast 06/27/2015 Mild diffuse cortical atrophy. No acute intracranial abnormality seen. Multilevel degenerative disc disease. No acute abnormality seen in the cervical spine. Electronically Signed   By: Marco [name], M.D.   On: 06/27/2015 17:37   Ct Cervical Spine Wo Contrast 06/27/2015 Mild diffuse cortical atrophy. No acute intracranial abnormality seen. Multilevel degenerative disc disease. No acute abnormality seen in the cervical spine. Electronically Signed   By: Marco [name], M.D.   On: 06/27/2015 17:37   Dg Chest Portable 1 View 06/27/2015  Endotracheal tube in grossly good position. Left upper lobe mass is significantly smaller compared to prior exam. Electronically Signed   By: Marco [name], M.D.   On: 06/27/2015 15:55   Medical Consultants:  Neurology Infectious disease consulted 10/28  Other Consultants:  PT SLP  IAnti-Infectives:   Vanco, start date, 10/24 --> Zosyn, start date, 10/24 --> Acyclovir, empiric, start date 10/25 >> 10/26   Marco Lenz, MD  [facility] Pager [phone]  Time spent in minutes: 25 minutes  If 7PM-7AM, please contact night-coverage [email or web address] Password [facility] 07/02/2015, 6:31 AM   LOS: 5 days    HPI/Subjective: No acute overnight events. No seizures. Did spike fever overnight.   Objective: Filed Vitals:   07/02/15 0200 07/02/15 0409 07/02/15 0411 07/02/15 0430  BP: 147/84 136/92    Pulse: 80 44    Temp:    98 F (36.7 C)  TempSrc:    Oral  Resp: 20  17   Height:      Weight:      SpO2: 100% 92%      Intake/Output Summary (Last 24 hours) at 07/02/15 0631 Last data filed at 07/02/15 0500  Gross per 24 hour  Intake 1813.33 ml  Output    650 ml  Net 1163.33 ml    Exam:   General:  Pt is not in acute distress  Cardiovascular: tachycardic, appreciate S1, S2   Respiratory: No wheezing, no crackles, no rhonchi  Abdomen: (+) BS, non tender  abdomen   Extremities: No LE swelling, palpable pulses   Neuro: Nonfocal  Data Reviewed: Basic Metabolic Panel:  Recent Labs Lab 06/27/15 1533 06/28/15 0327 06/29/15 0353 06/30/15 0340 07/01/15 0540  NA 140 141 146* 141 140  K 4.4 3.1* 3.2* 3.5 3.7  CL 107 112* 116* 112* 110  CO2 16* $Remov'23 24 23 26  'YSDxwm$ GLUCOSE 188* 125* 104* 109* 102*  BUN $Re'15 14 8 'FbI$ 5* <5*  CREATININE 1.38* 1.15 1.10 0.88 0.90  CALCIUM 9.0 7.9* 7.9* 7.8* 8.2*  MG  --   --  1.8 1.9  --   PHOS  --   --  2.5  --   --    Liver Function Tests:  Recent Labs Lab 06/27/15 1536  AST 31  ALT 10*  ALKPHOS 68  BILITOT 0.6  PROT 7.4  ALBUMIN 4.0   No results for input(s): LIPASE, AMYLASE in the last 168 hours. No results for input(s): AMMONIA in the last 168 hours. CBC:  Recent Labs Lab 06/27/15 1533 06/28/15 0327 06/29/15 0353 06/30/15 0340 07/01/15 0540  WBC  11.3* 11.0* 8.9 8.3 7.8  NEUTROABS 8.0*  --   --   --   --   HGB 13.9 12.1* 11.8* 10.4* 11.3*  HCT 44.2 36.5* 37.6* 32.1* 34.8*  MCV 90.6 87.1 90.2 89.2 87.4  PLT 226 185 148* 132* 166   Cardiac Enzymes:  Recent Labs Lab 06/27/15 1533 06/27/15 2058 06/27/15 2323 06/30/15 0340 06/30/15 1232  CKTOTAL  --  221  --  703* 1169*  TROPONINI <0.03  --  <0.03  --   --    BNP: Invalid input(s): POCBNP CBG:  Recent Labs Lab 07/01/15 1143 07/01/15 1531 07/01/15 1941 07/02/15 0003 07/02/15 0445  GLUCAP 88 82 79 88 71    MRSA PCR Screening     Status: None   Collection Time: 06/27/15  7:00 PM  Result Value Ref Range Status   MRSA by PCR NEGATIVE NEGATIVE Final  Culture, blood (x 2)     Status: None (Preliminary result)   Collection Time: 06/27/15 11:20 PM  Result Value Ref Range Status   Specimen Description BLOOD BLOOD LEFT HAND  Final   Special Requests BOTTLES DRAWN AEROBIC ONLY 10CC  Final   Culture   Final    NO GROWTH 3 DAYS Performed at [facility]    Report Status PENDING  Incomplete  Culture, blood (x 2)     Status:  None (Preliminary result)   Collection Time: 06/27/15 11:23 PM  Result Value Ref Range Status   Specimen Description BLOOD BLOOD RIGHT ARM  Final   Special Requests BOTTLES DRAWN AEROBIC AND ANAEROBIC 5CC  Final   Culture   Final    NO GROWTH 3 DAYS Performed at [facility]    Report Status PENDING  Incomplete  CSF culture     Status: None (Preliminary result)   Collection Time: 06/29/15 10:46 AM  Result Value Ref Range Status   Specimen Description CSF  Final   Special Requests Normal  Final   Gram Stain   Final   Culture   Final    NO GROWTH 2 DAYS Performed at [facility]    Report Status PENDING  Incomplete     Scheduled Meds: . antiseptic oral rinse  7 mL Mouth Rinse QID  . chlorhexidine gluconate  15 mL Mouth Rinse BID  . donepezil  10 mg Oral QHS  . enoxaparin (LOVENOX) injection  40 mg Subcutaneous Q24H  . famotidine (PEPCID) IV  20 mg Intravenous Q12H  . fluticasone  2 spray Each Nare Daily  . lacosamide (VIMPAT) IV  100 mg Intravenous Q12H  . levETIRAcetam  1,500 mg Intravenous Q12H  . LORazepam  1 mg Intravenous QHS  . piperacillin-tazobactam (ZOSYN)  IV  3.375 g Intravenous Q8H  . risperiDONE  3 mg Oral QHS  . traZODone  25 mg Oral QHS  . vancomycin  500 mg Intravenous Q12H   Continuous Infusions: . dextrose 5 % and 0.9% NaCl 50 mL/hr at 07/01/15 1602

## 2015-07-02 NOTE — Progress Notes (Signed)
MBSS complete. Full report located under chart review in imaging section. Khole Arterburn, MA CCC-SLP [phone]  

## 2015-07-02 NOTE — Progress Notes (Addendum)
MBS scheduled today for 12 pm.  Herbie Baltimore, MA CCC-SLP [phone]

## 2015-07-03 DIAGNOSIS — R569 Unspecified convulsions: Secondary | ICD-10-CM | POA: Diagnosis not present

## 2015-07-03 DIAGNOSIS — N179 Acute kidney failure, unspecified: Secondary | ICD-10-CM | POA: Diagnosis not present

## 2015-07-03 DIAGNOSIS — G40909 Epilepsy, unspecified, not intractable, without status epilepticus: Secondary | ICD-10-CM | POA: Diagnosis not present

## 2015-07-03 DIAGNOSIS — G40901 Epilepsy, unspecified, not intractable, with status epilepticus: Secondary | ICD-10-CM | POA: Diagnosis not present

## 2015-07-03 DIAGNOSIS — F0390 Unspecified dementia without behavioral disturbance: Secondary | ICD-10-CM | POA: Diagnosis not present

## 2015-07-03 DIAGNOSIS — J9601 Acute respiratory failure with hypoxia: Secondary | ICD-10-CM | POA: Diagnosis not present

## 2015-07-03 DIAGNOSIS — E440 Moderate protein-calorie malnutrition: Secondary | ICD-10-CM

## 2015-07-03 LAB — BASIC METABOLIC PANEL
ANION GAP: 5 (ref 5–15)
BUN: 7 mg/dL (ref 6–20)
CALCIUM: 8.3 mg/dL — AB (ref 8.9–10.3)
CO2: 28 mmol/L (ref 22–32)
Chloride: 107 mmol/L (ref 101–111)
Creatinine, Ser: 0.84 mg/dL (ref 0.61–1.24)
Glucose, Bld: 103 mg/dL — ABNORMAL HIGH (ref 65–99)
POTASSIUM: 4.4 mmol/L (ref 3.5–5.1)
Sodium: 140 mmol/L (ref 135–145)

## 2015-07-03 LAB — GLUCOSE, CAPILLARY
GLUCOSE-CAPILLARY: 68 mg/dL (ref 65–99)
GLUCOSE-CAPILLARY: 79 mg/dL (ref 65–99)
Glucose-Capillary: 70 mg/dL (ref 65–99)
Glucose-Capillary: 88 mg/dL (ref 65–99)
Glucose-Capillary: 88 mg/dL (ref 65–99)
Glucose-Capillary: 89 mg/dL (ref 65–99)
Glucose-Capillary: 93 mg/dL (ref 65–99)

## 2015-07-03 LAB — CULTURE, BLOOD (ROUTINE X 2)
Culture: NO GROWTH
Culture: NO GROWTH

## 2015-07-03 MED ORDER — ANTISEPTIC ORAL RINSE SOLUTION (CORINZ)
7.0000 mL | Freq: Two times a day (BID) | OROMUCOSAL | Status: DC
  Administered 2015-07-04 – 2015-07-05 (×2): 7 mL via OROMUCOSAL

## 2015-07-03 MED ORDER — LACOSAMIDE 50 MG PO TABS
100.0000 mg | ORAL_TABLET | Freq: Two times a day (BID) | ORAL | Status: DC
  Administered 2015-07-03 – 2015-07-05 (×4): 100 mg via ORAL
  Filled 2015-07-03 (×4): qty 2

## 2015-07-03 MED ORDER — FAMOTIDINE 20 MG PO TABS
20.0000 mg | ORAL_TABLET | Freq: Two times a day (BID) | ORAL | Status: DC
  Administered 2015-07-03 – 2015-07-05 (×4): 20 mg via ORAL
  Filled 2015-07-03 (×5): qty 1

## 2015-07-03 MED ORDER — HALOPERIDOL LACTATE 5 MG/ML IJ SOLN
2.0000 mg | Freq: Once | INTRAMUSCULAR | Status: AC
  Administered 2015-07-03: 2 mg via INTRAVENOUS

## 2015-07-03 MED ORDER — LEVETIRACETAM 750 MG PO TABS
1500.0000 mg | ORAL_TABLET | Freq: Two times a day (BID) | ORAL | Status: DC
  Administered 2015-07-03 – 2015-07-05 (×4): 1500 mg via ORAL
  Filled 2015-07-03: qty 2
  Filled 2015-07-03 (×2): qty 3
  Filled 2015-07-03 (×2): qty 2

## 2015-07-03 MED ORDER — HALOPERIDOL LACTATE 5 MG/ML IJ SOLN
INTRAMUSCULAR | Status: AC
  Filled 2015-07-03: qty 1

## 2015-07-03 MED ORDER — LORAZEPAM 1 MG PO TABS
1.0000 mg | ORAL_TABLET | Freq: Every day | ORAL | Status: DC
  Administered 2015-07-03 – 2015-07-04 (×2): 1 mg via ORAL
  Filled 2015-07-03 (×2): qty 1

## 2015-07-03 NOTE — Progress Notes (Addendum)
Patient ID: Marco [name], male   DOB: 01-May-1951, 64 y.o.   MRN: [id number] [facility] PROGRESS NOTE  CUONG MOORMAN FXT:[id number] DOB: 31-May-1951 DOA: 06/27/2015 PCP: Leamon Arnt, MD  Brief narrative:    65 year old male who resides in group home with past medical history of lung cancer, seizures who presented to [facility] ED after having 3 witnessed tonic-clonic seizures at group home. In ED, pt required intubation for airway protection.   SIGNIFICANT EVENTS: 10/23 Admit to ICU on ventilator 10/25 Fever tmax 101.1, failed SBT due to sedation  10/26 Extubated to [place] O2 10/28 Continues to spike fever, ID consulted, no official recommendations since only one time fever on 10/28   Assessment/Plan:    Principal Problem:  Acute respiratory failure with hypoxia ([place]) / VDRF in the setting of  Seizures - Please note patient was intubated on the admission for airway protection - Initially under PCCM care and then transitioned to [facility] 10/27 - Stable respiratory status  Active Problems: Convulsions/seizures ([place]) - Pt was intubated on admission for airway protection - Neurology has seen the pt in consultation - LP done and fluid analysis unremarkable - Continue keppra and vimpat - Seizure precautions  Sepsis ([place]) / UTI (urinary tract infection) / Leukocytosis  - Sepsis criteria met but source remains unclear - He has spiked fever 10/28 of 100.8 F - ID consulted but since no further episodes of fevers and blood, urine cultures negative - Per iD, abx stopped 10/28. 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 Currently on Immunotherapy with Nivolumab  3 mg/kg given every 2 weeks. Status post 6 cycles  Acute renal failure / High AG metabolic acidosis  - Secondary to rhabdomyolysis in the setting of seizures. - Resolved with IV fluids   Hypokalemia - Secondary to sepsis and acute renal failure - Supplemented and WNL  Hypernatremia - Secondary to sepsis, dehydration - Improved with hydration   Malnutrition of moderate degree - Nutrition consulted   DVT Prophylaxis  - Lovenox subQ ordered   Code Status: Full.  Family Communication:  family not at the bedside  Disposition Plan: monitor in SDU for next 24 hours  IV access:  Peripheral IV  Procedures and diagnostic studies:    Dg Swallowing Func-speech Pathology 07/02/2015    Dg Chest Port 1 View 06/30/2015  Interval repositioning of the endotracheal tube such that its tip is now 4 cm above the carina. There is no significant interval change in the appearance of the chest. Electronically Signed   By: David  Martinique M.D.   On: 06/30/2015 07:03   Dg Chest Port 1 View 06/29/2015 1. Endotracheal tube seen ending 8 cm above the carina. This could be advanced 4-5 cm. 2. Right basilar airspace opacity may reflect asymmetric pulmonary edema or pneumonia. 3. Left upper lobe mass, hilar retraction and apical opacification are relatively stable in appearance. These results were called by telephone at the time of interpretation on 06/29/2015 at 11:37 pm to Nursing in the [facility], who verbally acknowledged these results. Electronically Signed   By: Garald Balding M.D.   On: 06/29/2015 23:41   Dg Chest [facility]  1 View 06/29/2015   1. Left upper lobe mass and associated treatment changes in the apical left hemi thorax. 2. Bibasilar airspace opacification, possibly due to an infectious bronchiolitis or bronchopneumonia. Electronically Signed   By: Lorin Picket M.D.   On: 06/29/2015 07:20   Ct Head Wo Contrast 06/27/2015 Mild diffuse cortical atrophy. No acute intracranial abnormality  seen. Multilevel degenerative disc disease. No acute abnormality seen in the cervical spine. Electronically Signed   By: Marijo Conception, M.D.   On: 06/27/2015 17:37   Ct Cervical Spine Wo Contrast 06/27/2015 Mild diffuse cortical atrophy. No acute intracranial abnormality seen. Multilevel degenerative disc disease. No acute abnormality seen in the cervical spine. Electronically Signed   By: Marijo Conception, M.D.   On: 06/27/2015 17:37   Dg Chest Portable 1 View 06/27/2015  Endotracheal tube in grossly good position. Left upper lobe mass is significantly smaller compared to prior exam. Electronically Signed   By: Marijo Conception, M.D.   On: 06/27/2015 15:55   Medical Consultants:  Neurology Infectious disease consulted 10/28  Other Consultants:  PT SLP  IAnti-Infectives:   Vanco, start date, 10/24 --> Zosyn, start date, 10/24 --> Acyclovir, empiric, start date 10/25 >> 10/26   Leisa Lenz, MD  [facility] Pager [phone]  Time spent in minutes: 25 minutes  If 7PM-7AM, please contact night-coverage [email or web address] Password [email or web address] 07/03/2015, 5:30 AM   LOS: 6 days    HPI/Subjective: No acute overnight events. No reports of seizures.  Objective: Filed Vitals:   07/03/15 0100 07/03/15 0200 07/03/15 0300 07/03/15 0400  BP:  144/86  150/86  Pulse: 77 102 64 96  Temp:      TempSrc:      Resp: $Remo'22 15 17 18  'VEtbu$ Height:      Weight:      SpO2: 98% 99% 100% 100%    Intake/Output Summary (Last 24 hours) at 07/03/15 0530 Last data filed at 07/03/15 0400  Gross per 24 hour  Intake   1750 ml  Output    650 ml  Net   1100 ml    Exam:   General:  Pt is sleeping, no acute distress  Cardiovascular rate controlled, (+) S1,S2  Respiratory: bilateral air entry, coarse breath sounds, no wheezing   Abdomen: non tender abdomen, (+) BS  Extremities: No edema, appreciate pulses bilaterally   Neuro: No focal deficits   Data Reviewed: Basic Metabolic Panel:  Recent  Labs Lab 06/28/15 0327 06/29/15 0353 06/30/15 0340 07/01/15 0540 07/03/15 0350  NA 141 146* 141 140 140  K 3.1* 3.2* 3.5 3.7 4.4  CL 112* 116* 112* 110 107  CO2 $Re'23 24 23 26 28  'Ftc$ GLUCOSE 125* 104* 109* 102* 103*  BUN 14 8 5* <5* 7  CREATININE 1.15 1.10 0.88 0.90 0.84  CALCIUM 7.9* 7.9* 7.8* 8.2* 8.3*  MG  --  1.8 1.9  --   --   PHOS  --  2.5  --   --   --    Liver Function Tests:  Recent Labs Lab 06/27/15 1536  AST 31  ALT 10*  ALKPHOS 68  BILITOT 0.6  PROT 7.4  ALBUMIN 4.0   No results for input(s): LIPASE, AMYLASE in the last 168 hours. No results for input(s): AMMONIA in the last 168 hours. CBC:  Recent Labs Lab 06/27/15 1533 06/28/15 0327 06/29/15 0353 06/30/15 0340 07/01/15 0540  WBC 11.3* 11.0* 8.9 8.3 7.8  NEUTROABS 8.0*  --   --   --   --  HGB 13.9 12.1* 11.8* 10.4* 11.3*  HCT 44.2 36.5* 37.6* 32.1* 34.8*  MCV 90.6 87.1 90.2 89.2 87.4  PLT 226 185 148* 132* 166   Cardiac Enzymes:  Recent Labs Lab 06/27/15 1533 06/27/15 2058 06/27/15 2323 06/30/15 0340 06/30/15 1232  CKTOTAL  --  221  --  703* 1169*  TROPONINI <0.03  --  <0.03  --   --    BNP: Invalid input(s): POCBNP CBG:  Recent Labs Lab 07/02/15 0735 07/02/15 1112 07/02/15 1553 07/02/15 2002 07/03/15 0052  GLUCAP 118* 93 90 86 89    MRSA PCR Screening     Status: None   Collection Time: 06/27/15  7:00 PM  Result Value Ref Range Status   MRSA by PCR NEGATIVE NEGATIVE Final  Culture, blood (x 2)     Status: None (Preliminary result)   Collection Time: 06/27/15 11:20 PM  Result Value Ref Range Status   Specimen Description BLOOD BLOOD LEFT HAND  Final   Special Requests BOTTLES DRAWN AEROBIC ONLY 10CC  Final   Culture   Final    NO GROWTH 3 DAYS Performed at [facility]    Report Status PENDING  Incomplete  Culture, blood (x 2)     Status: None (Preliminary result)   Collection Time: 06/27/15 11:23 PM  Result Value Ref Range Status   Specimen Description BLOOD  BLOOD RIGHT ARM  Final   Special Requests BOTTLES DRAWN AEROBIC AND ANAEROBIC 5CC  Final   Culture   Final    NO GROWTH 3 DAYS Performed at [facility]    Report Status PENDING  Incomplete  CSF culture     Status: None (Preliminary result)   Collection Time: 06/29/15 10:46 AM  Result Value Ref Range Status   Specimen Description CSF  Final   Special Requests Normal  Final   Gram Stain   Final   Culture   Final    NO GROWTH 2 DAYS Performed at [facility]    Report Status PENDING  Incomplete     Scheduled Meds: . antiseptic oral rinse  7 mL Mouth Rinse QID  . chlorhexidine gluconate  15 mL Mouth Rinse BID  . donepezil  10 mg Oral QHS  . enoxaparin (LOVENOX) injection  40 mg Subcutaneous Q24H  . famotidine (PEPCID) IV  20 mg Intravenous Q12H  . fluticasone  2 spray Each Nare Daily  . haloperidol lactate      . lacosamide (VIMPAT) IV  100 mg Intravenous Q12H  . levETIRAcetam  1,500 mg Intravenous Q12H  . LORazepam  0.5 mg Intravenous Once  . LORazepam  1 mg Intravenous QHS  . risperiDONE  3 mg Oral QHS  . traZODone  25 mg Oral QHS   Continuous Infusions: . dextrose 5 % and 0.9% NaCl 50 mL/hr at 07/02/15 1400

## 2015-07-04 DIAGNOSIS — J9601 Acute respiratory failure with hypoxia: Secondary | ICD-10-CM | POA: Diagnosis not present

## 2015-07-04 DIAGNOSIS — N179 Acute kidney failure, unspecified: Secondary | ICD-10-CM | POA: Diagnosis not present

## 2015-07-04 DIAGNOSIS — G40901 Epilepsy, unspecified, not intractable, with status epilepticus: Secondary | ICD-10-CM | POA: Diagnosis not present

## 2015-07-04 DIAGNOSIS — F0390 Unspecified dementia without behavioral disturbance: Secondary | ICD-10-CM | POA: Diagnosis not present

## 2015-07-04 DIAGNOSIS — G40909 Epilepsy, unspecified, not intractable, without status epilepticus: Secondary | ICD-10-CM | POA: Diagnosis not present

## 2015-07-04 DIAGNOSIS — R569 Unspecified convulsions: Secondary | ICD-10-CM | POA: Diagnosis not present

## 2015-07-04 LAB — GLUCOSE, CAPILLARY
GLUCOSE-CAPILLARY: 113 mg/dL — AB (ref 65–99)
GLUCOSE-CAPILLARY: 116 mg/dL — AB (ref 65–99)
GLUCOSE-CAPILLARY: 64 mg/dL — AB (ref 65–99)
GLUCOSE-CAPILLARY: 78 mg/dL (ref 65–99)
GLUCOSE-CAPILLARY: 85 mg/dL (ref 65–99)
Glucose-Capillary: 102 mg/dL — ABNORMAL HIGH (ref 65–99)
Glucose-Capillary: 97 mg/dL (ref 65–99)

## 2015-07-04 LAB — CBC
HCT: 34.3 % — ABNORMAL LOW (ref 39.0–52.0)
HEMOGLOBIN: 11.1 g/dL — AB (ref 13.0–17.0)
MCH: 27.9 pg (ref 26.0–34.0)
MCHC: 32.4 g/dL (ref 30.0–36.0)
MCV: 86.2 fL (ref 78.0–100.0)
Platelets: 195 10*3/uL (ref 150–400)
RBC: 3.98 MIL/uL — AB (ref 4.22–5.81)
RDW: 14.5 % (ref 11.5–15.5)
WBC: 6.7 10*3/uL (ref 4.0–10.5)

## 2015-07-04 LAB — CREATININE, SERUM: CREATININE: 0.8 mg/dL (ref 0.61–1.24)

## 2015-07-04 LAB — BASIC METABOLIC PANEL
ANION GAP: 6 (ref 5–15)
BUN: 7 mg/dL (ref 6–20)
CALCIUM: 8.6 mg/dL — AB (ref 8.9–10.3)
CO2: 27 mmol/L (ref 22–32)
Chloride: 110 mmol/L (ref 101–111)
Creatinine, Ser: 0.82 mg/dL (ref 0.61–1.24)
GFR calc non Af Amer: >60 mL/min (ref >60)
GLUCOSE: 110 mg/dL — AB (ref 65–99)
POTASSIUM: 3.2 mmol/L — AB (ref 3.5–5.1)
Sodium: 143 mmol/L (ref 135–145)

## 2015-07-04 NOTE — Progress Notes (Signed)
Patient arrived from ICU to room 1516.  Alert & oriented X3.  Denies pain.  Room air.  IVs intact, one SL and one with fluids infusing, wrapped.  Non productive cough.  Condom catheter patent.  Denies any needs at this time.

## 2015-07-04 NOTE — Progress Notes (Addendum)
Patient ID: Marco [name], male   DOB: Jan 14, 1951, 64 y.o.   MRN: [id number] [facility] PROGRESS NOTE  Marco [name] YME:[id number] DOB: 10/22/1950 DOA: 06/27/2015 PCP: Leamon Arnt, MD  Brief narrative:    64 year old male who resides in group home with past medical history of lung cancer, seizures who presented to [facility] ED after having 3 witnessed tonic-clonic seizures at group home. In ED, pt required intubation for airway protection.   SIGNIFICANT EVENTS: 10/23 Admit to ICU on ventilator 10/25 Fever tmax 101.1, failed SBT due to sedation  10/26 Extubated to  O2 10/28 Continues to spike fever, ID consulted, no official recommendations since only one time fever on 10/28 10/30 Transfer to telemetry    Assessment/Plan:    Principal Problem:  Acute respiratory failure with hypoxia ([place]) / VDRF in the setting of  Seizures - Patient was intubated on the admission for airway protection - PCCM initially primary team. Care transitioned to [facility] 10/27 - Respiratory status remains stable - Transfer to telemetry   Active Problems: Convulsions/seizures ([place]) - Intubated on admission for airway protection - Appreciate neurology seeing the pt in consultation - LP was  done and fluid analysis unremarkable - Seizure precautions - No further seizures reports  - We will continue Keppra and Vimpat   Sepsis ([place]) / UTI (urinary tract infection) / Leukocytosis  - Sepsis criteria met but source remains unclear. 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 Currently on Immunotherapy with Nivolumab 3 mg/kg given every 2 weeks. Status post 6 cycles  Acute renal failure / High AG metabolic acidosis  - Likely due to rhabdomyolysis in the setting of seizures. - Resolved with hydration   Hypokalemia - Secondary to sepsis and acute renal failure - Supplemented  - Check BMP today   Hypernatremia - Secondary to sepsis, dehydration - Resolved with hydration  - BMP this am is pending    Malnutrition of moderate degree - Seen by dietician   DVT Prophylaxis  - Lovenox subQ   Code Status: Full.  Family Communication:  family not at the bedside  Disposition Plan: transfer to telemetry today   IV access:  Peripheral IV  Procedures and diagnostic studies:    Dg Swallowing Func-speech Pathology 07/02/2015    Dg Chest Port 1 View 06/30/2015  Interval repositioning of the endotracheal tube such that its tip is now 4 cm above the carina. There is no significant interval change in the appearance of the chest. Electronically Signed   By: David  Martinique M.D.   On: 06/30/2015 07:03   Dg Chest Port 1 View 06/29/2015 1. Endotracheal tube seen ending 8 cm above the carina. This could be advanced 4-5 cm. 2. Right basilar airspace opacity may reflect asymmetric pulmonary edema or pneumonia. 3. Left upper lobe mass, hilar retraction and apical opacification are relatively stable in appearance. These results were called by telephone at the time of interpretation on 06/29/2015 at  11:37 pm to Nursing in the [facility], who verbally acknowledged these results. Electronically Signed   By: Garald Balding M.D.   On: 06/29/2015 23:41   Dg Chest Port 1 View 06/29/2015   1. Left upper lobe mass and associated treatment changes in the apical left hemi thorax. 2. Bibasilar airspace opacification, possibly due to an infectious bronchiolitis or  bronchopneumonia. Electronically Signed   By: Lorin Picket M.D.   On: 06/29/2015 07:20   Ct Head Wo Contrast 06/27/2015 Mild diffuse cortical atrophy. No acute intracranial abnormality seen. Multilevel degenerative disc disease. No acute abnormality seen in the cervical spine. Electronically Signed   By: Marijo Conception, M.D.   On: 06/27/2015 17:37   Ct Cervical Spine Wo Contrast 06/27/2015 Mild diffuse cortical atrophy. No acute intracranial abnormality seen. Multilevel degenerative disc disease. No acute abnormality seen in the cervical spine. Electronically Signed   By: Marijo Conception, M.D.   On: 06/27/2015 17:37   Dg Chest Portable 1 View 06/27/2015  Endotracheal tube in grossly good position. Left upper lobe mass is significantly smaller compared to prior exam. Electronically Signed   By: Marijo Conception, M.D.   On: 06/27/2015 15:55   Medical Consultants:  Neurology Infectious disease consulted 10/28  Other Consultants:  PT SLP Nutrition  IAnti-Infectives:   Vanco, start date, 10/24 --> Zosyn, start date, 10/24 --> Acyclovir, empiric, start date 10/25 >> 10/26   Leisa Lenz, MD  [facility] Pager [phone]  Time spent in minutes: 25 minutes  If 7PM-7AM, please contact night-coverage [email or web address] Password [email or web address] 07/04/2015, 5:22 AM   LOS: 7 days    HPI/Subjective: No acute overnight events. No seizures. No respiratory distress.   Objective: Filed Vitals:   07/03/15 2000 07/03/15 2200 07/04/15 0000 07/04/15 0400  BP: 120/93 132/76 119/66 137/59  Pulse: 88 86 92 96  Temp: 97.7 F (36.5 C)  99.1 F (37.3 C) 97.4 F (36.3 C)  TempSrc: Oral  Axillary Axillary  Resp: 39 $Remove'18 21 29  'qDUlErw$ Height:      Weight:      SpO2: 100% 100% 98% 98%    Intake/Output Summary (Last 24 hours) at 07/04/15 0522 Last data filed at 07/04/15 0400  Gross per 24 hour  Intake   2070 ml  Output   1525 ml  Net    545 ml    Exam:   General:  Pt is not in acute  distress  Cardiovascular RRR, appreciate S1, S2   Respiratory: coarse sounds but no wheezing    Abdomen:  Appreciate bowel sounds, non tender and non distended abdomen   Extremities: No leg swelling, palpable pulses   Neuro: Non focal   Data Reviewed: Basic Metabolic Panel:  Recent Labs Lab 06/28/15 0327 06/29/15 0353 06/30/15 0340 07/01/15 0540 07/03/15 0350 07/04/15 0341  NA 141 146* 141 140 140  --   K 3.1* 3.2* 3.5 3.7 4.4  --   CL 112* 116* 112* 110 107  --   CO2 $Re'23 24 23 26 28  'TdF$ --   GLUCOSE 125* 104* 109* 102* 103*  --   BUN 14 8 5* <5* 7  --   CREATININE 1.15 1.10 0.88 0.90 0.84 0.80  CALCIUM 7.9* 7.9* 7.8* 8.2* 8.3*  --   MG  --  1.8 1.9  --   --   --   PHOS  --  2.5  --   --   --   --  Liver Function Tests:  Recent Labs Lab 06/27/15 1536  AST 31  ALT 10*  ALKPHOS 68  BILITOT 0.6  PROT 7.4  ALBUMIN 4.0   No results for input(s): LIPASE, AMYLASE in the last 168 hours. No results for input(s): AMMONIA in the last 168 hours. CBC:  Recent Labs Lab 06/27/15 1533 06/28/15 0327 06/29/15 0353 06/30/15 0340 07/01/15 0540  WBC 11.3* 11.0* 8.9 8.3 7.8  NEUTROABS 8.0*  --   --   --   --   HGB 13.9 12.1* 11.8* 10.4* 11.3*  HCT 44.2 36.5* 37.6* 32.1* 34.8*  MCV 90.6 87.1 90.2 89.2 87.4  PLT 226 185 148* 132* 166   Cardiac Enzymes:  Recent Labs Lab 06/27/15 1533 06/27/15 2058 06/27/15 2323 06/30/15 0340 06/30/15 1232  CKTOTAL  --  221  --  703* 1169*  TROPONINI <0.03  --  <0.03  --   --    BNP: Invalid input(s): POCBNP CBG:  Recent Labs Lab 07/03/15 1202 07/03/15 1654 07/03/15 2007 07/03/15 2345 07/04/15 0341  GLUCAP 88 93 70 116* 102*    MRSA PCR Screening     Status: None   Collection Time: 06/27/15  7:00 PM  Result Value Ref Range Status   MRSA by PCR NEGATIVE NEGATIVE Final  Culture, blood (x 2)     Status: None (Preliminary result)   Collection Time: 06/27/15 11:20 PM  Result Value Ref Range Status   Specimen Description  BLOOD BLOOD LEFT HAND  Final   Special Requests BOTTLES DRAWN AEROBIC ONLY 10CC  Final   Culture   Final    NO GROWTH 3 DAYS Performed at [facility]    Report Status PENDING  Incomplete  Culture, blood (x 2)     Status: None (Preliminary result)   Collection Time: 06/27/15 11:23 PM  Result Value Ref Range Status   Specimen Description BLOOD BLOOD RIGHT ARM  Final   Special Requests BOTTLES DRAWN AEROBIC AND ANAEROBIC 5CC  Final   Culture   Final    NO GROWTH 3 DAYS Performed at [facility]    Report Status PENDING  Incomplete  CSF culture     Status: None (Preliminary result)   Collection Time: 06/29/15 10:46 AM  Result Value Ref Range Status   Specimen Description CSF  Final   Special Requests Normal  Final   Gram Stain   Final   Culture   Final    NO GROWTH 2 DAYS Performed at [facility]    Report Status PENDING  Incomplete     Scheduled Meds: . antiseptic oral rinse  7 mL Mouth Rinse BID  . chlorhexidine gluconate  15 mL Mouth Rinse BID  . donepezil  10 mg Oral QHS  . enoxaparin (LOVENOX) injection  40 mg Subcutaneous Q24H  . famotidine  20 mg Oral BID  . fluticasone  2 spray Each Nare Daily  . lacosamide  100 mg Oral BID  . levETIRAcetam  1,500 mg Oral BID  . LORazepam  1 mg Oral QHS  . risperiDONE  3 mg Oral QHS  . traZODone  25 mg Oral QHS   Continuous Infusions: . dextrose 5 % and 0.9% NaCl 50 mL/hr at 07/03/15 1909

## 2015-07-05 ENCOUNTER — Telehealth: Payer: Self-pay | Admitting: Medical Oncology

## 2015-07-05 ENCOUNTER — Telehealth: Payer: Self-pay | Admitting: Internal Medicine

## 2015-07-05 DIAGNOSIS — E870 Hyperosmolality and hypernatremia: Secondary | ICD-10-CM | POA: Diagnosis not present

## 2015-07-05 DIAGNOSIS — G40901 Epilepsy, unspecified, not intractable, with status epilepticus: Secondary | ICD-10-CM | POA: Diagnosis not present

## 2015-07-05 DIAGNOSIS — F0390 Unspecified dementia without behavioral disturbance: Secondary | ICD-10-CM | POA: Diagnosis not present

## 2015-07-05 DIAGNOSIS — J9601 Acute respiratory failure with hypoxia: Secondary | ICD-10-CM | POA: Diagnosis not present

## 2015-07-05 DIAGNOSIS — G40909 Epilepsy, unspecified, not intractable, without status epilepticus: Secondary | ICD-10-CM | POA: Diagnosis not present

## 2015-07-05 DIAGNOSIS — R569 Unspecified convulsions: Secondary | ICD-10-CM | POA: Diagnosis not present

## 2015-07-05 LAB — GLUCOSE, CAPILLARY
GLUCOSE-CAPILLARY: 100 mg/dL — AB (ref 65–99)
GLUCOSE-CAPILLARY: 104 mg/dL — AB (ref 65–99)
Glucose-Capillary: 97 mg/dL (ref 65–99)
Glucose-Capillary: 81 mg/dL (ref 65–99)

## 2015-07-05 LAB — CBC
HCT: 34.2 % — ABNORMAL LOW (ref 39.0–52.0)
Hemoglobin: 11 g/dL — ABNORMAL LOW (ref 13.0–17.0)
MCH: 27.7 pg (ref 26.0–34.0)
MCHC: 32.2 g/dL (ref 30.0–36.0)
MCV: 86.1 fL (ref 78.0–100.0)
PLATELETS: 217 10*3/uL (ref 150–400)
RBC: 3.97 MIL/uL — ABNORMAL LOW (ref 4.22–5.81)
RDW: 14.5 % (ref 11.5–15.5)
WBC: 5.2 10*3/uL (ref 4.0–10.5)

## 2015-07-05 LAB — BASIC METABOLIC PANEL
ANION GAP: 7 (ref 5–15)
BUN: 8 mg/dL (ref 6–20)
CALCIUM: 8.5 mg/dL — AB (ref 8.9–10.3)
CO2: 27 mmol/L (ref 22–32)
Chloride: 110 mmol/L (ref 101–111)
Creatinine, Ser: 0.85 mg/dL (ref 0.61–1.24)
GFR calc Af Amer: >60 mL/min (ref >60)
GLUCOSE: 106 mg/dL — AB (ref 65–99)
Potassium: 3 mmol/L — ABNORMAL LOW (ref 3.5–5.1)
Sodium: 144 mmol/L (ref 135–145)

## 2015-07-05 MED ORDER — LORAZEPAM 1 MG PO TABS: 2.0000 mg | ORAL_TABLET | Freq: Every day | ORAL | Status: DC

## 2015-07-05 MED ORDER — RESOURCE THICKENUP CLEAR PO POWD: 1.0000 | ORAL | Status: DC | PRN

## 2015-07-05 MED ORDER — FAMOTIDINE 20 MG PO TABS: 20.0000 mg | ORAL_TABLET | Freq: Two times a day (BID) | ORAL | Status: DC

## 2015-07-05 MED ORDER — HYDROCODONE-HOMATROPINE 5-1.5 MG/5ML PO SYRP: 5.0000 mL | ORAL_SOLUTION | Freq: Four times a day (QID) | ORAL | Status: DC | PRN

## 2015-07-05 MED ORDER — POTASSIUM CHLORIDE CRYS ER 20 MEQ PO TBCR
40.0000 meq | EXTENDED_RELEASE_TABLET | Freq: Once | ORAL | Status: AC
  Administered 2015-07-05: 40 meq via ORAL
  Filled 2015-07-05: qty 2

## 2015-07-05 MED ORDER — TRAZODONE HCL 50 MG PO TABS: 25.0000 mg | ORAL_TABLET | Freq: Every day | ORAL | Status: DC

## 2015-07-05 MED ORDER — LACOSAMIDE 100 MG PO TABS
100.0000 mg | ORAL_TABLET | Freq: Two times a day (BID) | ORAL | Status: DC
Start: 2015-07-05 — End: 2015-07-15

## 2015-07-05 MED ORDER — RISPERIDONE 3 MG PO TABS: 3.0000 mg | ORAL_TABLET | Freq: Every day | ORAL | Status: DC

## 2015-07-05 NOTE — Discharge Instructions (Signed)
Epilepsy People with epilepsy have times when they shake and jerk uncontrollably (seizures). This happens when there is a sudden change in brain function. Epilepsy may have many possible causes. Anything that disturbs the normal pattern of brain cell activity can lead to seizures. HOME CARE   Follow your doctor's instructions about driving and safety during normal activities.  Get enough sleep.  Only take medicine as told by your doctor.  Avoid things that you know can cause you to have seizures (triggers).  Write down when your seizures happen and what you remember about each seizure. Write down anything you think may have caused the seizure to happen.  Tell the people you live and work with that you have seizures. Make sure they know how to help you. They should:  Cushion your head and body.  Turn you on your side.  Not restrain you.  Not place anything inside your mouth.  Call for local emergency medical help if there is any question about what has happened.  Keep all follow-up visits with your doctor. This is very important. GET HELP IF:  You get an infection or start to feel sick. You may have more seizures when you are sick.  You are having seizures more often.  Your seizure pattern is changing. GET HELP RIGHT AWAY IF:   A seizure does not stop after a few seconds or minutes.  A seizure causes you to have trouble breathing.  A seizure gives you a very bad headache.  A seizure makes you unable to speak or use a part of your body.   This information is not intended to replace advice given to you by your health care provider. Make sure you discuss any questions you have with your health care provider.   Document Released: 06/18/2009 Document Revised: 06/11/2013 Document Reviewed: 04/02/2013 Elsevier Interactive Patient Education [employer].

## 2015-07-05 NOTE — Progress Notes (Signed)
CSW went and introduced self to patient and owner of group home [facility] [place]). Patient alert and oriented. Mrs. [name] informed CSW that she would like PT to evaluate patient again before discharging. PT evaluated. Mrs. [name] also requested to speak with MD regarding patient medications. Per MD, provide owner with MD contact information.   Mrs. [name] informed CSW that patient will be transported by staff member from [place]. Package to be sent with patient. Patient to transport to [place] via staff member when cleared. RN provided report to owner. No further needs were requested at this time. CSW to sign off.   Please consult if further CSW needs arise.   Lucius Conn, [place] Social Worker [place] [phone]

## 2015-07-05 NOTE — Discharge Summary (Signed)
Physician Discharge Summary  EREZ MCCALLUM RJJ:[id number] DOB: April 10, 1951 DOA: 06/27/2015  PCP: Leamon Arnt, MD  Admit date: 06/27/2015 Discharge date: 07/05/2015  Recommendations for Outpatient Follow-up:  1. Please note vimpat added for seizure control 2. 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 In ED, pt required intubation for airway protection.   SIGNIFICANT EVENTS: 10/23 Admit to ICU on ventilator 10/25 Fever tmax 101.1, failed SBT due to sedation  10/26 Extubated to [place] O2 10/28 Continues to spike fever, ID consulted, no official recommendations since only one time fever on 10/28 10/30 Transferred to telemetry    Hospital Course:   Assessment/Plan:    Principal Problem:  Acute respiratory failure with hypoxia ([place]) / VDRF in the setting of Seizures - Patient was intubated on the admission for airway protection - PCCM initially primary team. Care transitioned to [facility] 10/27 - Respiratory status stable   Active Problems: Convulsions/seizures ([place]) - Intubated on admission for airway protection - Neurology has seen the patient in consultation. - Lumbar puncture done and fluid analysis is unremarkable. - No further seizures in the hospital - We  will continue Keppra and Vimpat on discharge along with Ativan at bedtime  Sepsis ([place]) / UTI (urinary tract infection) / Leukocytosis  - Sepsis criteria met but source remains unclear. 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 Currently on Immunotherapy with Nivolumab 3 mg/kg given every 2 weeks. Status post 6 cycles  Acute renal failure / High AG metabolic acidosis  - Likely due to rhabdomyolysis in the setting of seizures. - Resolved with fluids   Hypokalemia - Secondary to sepsis and acute renal failure - Supplemented prior to discharge   Hypernatremia - Secondary to sepsis, dehydration - Resolved with hydration   Malnutrition of moderate degree - Seen by dietician   DVT Prophylaxis  - Lovenox subQ   Code Status: Full.  Family Communication: family not at the bedside    IV access:  Peripheral IV  Procedures and diagnostic studies:   Dg Swallowing Func-speech Pathology 07/02/2015   Dg Chest Port 1 View 06/30/2015 Interval repositioning of the endotracheal tube such that its tip is now 4 cm above the carina. There is no significant  interval change in the appearance of the chest. Electronically Signed By: David Martinique M.D. On: 06/30/2015 07:03   Dg Chest Port 1 View 06/29/2015 1. Endotracheal tube seen ending 8 cm above the carina. This could be advanced 4-5 cm. 2. Right basilar airspace opacity may  reflect asymmetric pulmonary edema or pneumonia. 3. Left upper lobe mass, hilar retraction and apical opacification are relatively stable in appearance. These results were called by telephone at the time of interpretation on 06/29/2015 at 11:37 pm to Nursing in the [facility], who verbally acknowledged these results. Electronically Signed By: Garald Balding M.D. On: 06/29/2015 23:41   Dg Chest Port 1 View 06/29/2015 1. Left upper lobe mass and associated treatment changes in the apical left hemi thorax. 2. Bibasilar airspace opacification, possibly due to an infectious bronchiolitis or bronchopneumonia. Electronically Signed By: Lorin Picket M.D. On: 06/29/2015 07:20   Ct Head Wo Contrast 06/27/2015 Mild diffuse cortical atrophy. No acute intracranial abnormality seen. Multilevel degenerative disc disease. No acute abnormality seen in the cervical spine. Electronically Signed By: Marijo Conception, M.D. On: 06/27/2015 17:37   Ct Cervical Spine Wo Contrast 06/27/2015 Mild diffuse cortical atrophy. No acute intracranial abnormality seen. Multilevel degenerative disc disease. No acute abnormality seen in the cervical spine. Electronically Signed By: Marijo Conception, M.D. On: 06/27/2015 17:37   Dg Chest Portable 1 View 06/27/2015 Endotracheal tube in grossly good position. Left upper lobe mass is significantly smaller compared to prior exam. Electronically Signed By: Marijo Conception, M.D. On: 06/27/2015 15:55   Medical Consultants:  Neurology Infectious disease consulted 10/28  Other Consultants:  PT SLP Nutrition  IAnti-Infectives:   Vanco, start date, 10/24 --> Zosyn, start date, 10/24 --> Acyclovir, empiric, start date 10/25 >> 10/26     Signed:  Leisa Lenz, MD  [facility] 07/05/2015, 9:15 AM  Pager #: [phone]  Time spent in minutes: more than 30 minutes   Discharge Exam: Filed Vitals:   07/05/15 0620  BP: 128/78   Pulse: 100  Temp: 98.3 F (36.8 C)  Resp: 18   Filed Vitals:   07/04/15 1245 07/04/15 1420 07/04/15 2122 07/05/15 0620  BP: 142/83 133/77 138/80 128/78  Pulse: 78 100 78 100  Temp: 99.2 F (37.3 C) 98.5 F (36.9 C) 98.1 F (36.7 C) 98.3 F (36.8 C)  TempSrc: Oral Oral Oral Oral  Resp: $Remo'18 18 19 18  'HYXWu$ Height:      Weight: 68.493 kg (151 lb)   69.9 kg (154 lb 1.6 oz)  SpO2: 98% 100% 100% 100%    General: Pt is alert, follows commands appropriately, not in acute distress Cardiovascular: Regular rate and rhythm, S1/S2 + Respiratory: No wheezing, no crackles, no rhonchi Abdominal: Soft, non tender, non distended, bowel sounds +, no guarding Extremities: no edema, no cyanosis, pulses palpable bilaterally DP and PT Neuro: Grossly nonfocal  Discharge Instructions  Discharge Instructions    Call MD for:  difficulty breathing, headache or visual disturbances    Complete by:  As directed      Call MD for:  persistant dizziness or light-headedness    Complete by:  As directed      Call MD for:  persistant nausea and vomiting    Complete by:  As directed      Call MD for:  severe uncontrolled pain    Complete by:  As directed      Diet - low sodium heart healthy    Complete by:  As directed  Increase activity slowly    Complete by:  As directed             Medication List    STOP taking these medications        omeprazole 20 MG capsule  Commonly known as:  PRILOSEC      TAKE these medications        bisacodyl 5 MG EC tablet  Commonly known as:  DULCOLAX  Take 5 mg by mouth daily as needed for moderate constipation (constipation).     donepezil 10 MG tablet  Commonly known as:  ARICEPT  Take 10 mg by mouth at bedtime.     erythromycin ophthalmic ointment  Place 1 application into the left eye 3 (three) times daily.     famotidine 20 MG tablet  Commonly known as:  PEPCID  Take 1 tablet (20 mg total) by mouth 2 (two) times daily.     fluticasone 50 MCG/ACT  nasal spray  Commonly known as:  FLONASE  Place 2 sprays into both nostrils daily.     HYDROcodone-homatropine 5-1.5 MG/5ML syrup  Commonly known as:  HYCODAN  Take 5 mLs by mouth every 6 (six) hours as needed for cough.     Lacosamide 100 MG Tabs  Take 1 tablet (100 mg total) by mouth 2 (two) times daily.     levETIRAcetam 750 MG tablet  Commonly known as:  KEPPRA  Take 2 tablets (1,500 mg total) by mouth 2 (two) times daily.     LORazepam 1 MG tablet  Commonly known as:  ATIVAN  Take 2 tablets (2 mg total) by mouth at bedtime.     prochlorperazine 10 MG tablet  Commonly known as:  COMPAZINE  Take 10 mg by mouth every 6 (six) hours as needed for nausea or vomiting.     RESOURCE THICKENUP CLEAR Powd  Take 120 g by mouth as needed.     risperiDONE 3 MG tablet  Commonly known as:  RISPERDAL  Take 1 tablet (3 mg total) by mouth at bedtime.     traZODone 50 MG tablet  Commonly known as:  DESYREL  Take 0.5 tablets (25 mg total) by mouth at bedtime.           Follow-up Information    Follow up with [name],[name], MD. Schedule an appointment as soon as possible for a visit in 1 week.   Specialty:  Family Medicine   Why:  Follow up appt after recent hospitalization   Contact information:   [place] [address] [phone]        The results of significant diagnostics from this hospitalization (including imaging, microbiology, ancillary and laboratory) are listed below for reference.    Significant Diagnostic Studies: Ct Head Wo Contrast  06/27/2015  CLINICAL DATA:  Seizure. EXAM: CT HEAD WITHOUT CONTRAST CT CERVICAL SPINE WITHOUT CONTRAST TECHNIQUE: Multidetector CT imaging of the head and cervical spine was performed following the standard protocol without intravenous contrast. Multiplanar CT image reconstructions of the cervical spine were also generated. COMPARISON:  CT scan of head of April 17, 2015. FINDINGS: CT HEAD FINDINGS Bony  calvarium appears intact. Mild diffuse cortical atrophy is noted. No mass effect or midline shift is noted. Ventricular size is within normal limits. There is no evidence of mass lesion, hemorrhage or acute infarction. CT CERVICAL SPINE FINDINGS No fracture is noted. Grade 1 retrolisthesis of C3-4 is noted secondary to degenerative disc disease at this level. Anterior  osteophyte formation is also noted at C4-5, C5-6 and C6-7. Mild degenerative disc disease is noted at C6-7. Posterior facet joints are unremarkable. IMPRESSION: Mild diffuse cortical atrophy. No acute intracranial abnormality seen. Multilevel degenerative disc disease. No acute abnormality seen in the cervical spine. Electronically Signed   By: Marijo Conception, M.D.   On: 06/27/2015 17:37   Ct Cervical Spine Wo Contrast  06/27/2015  CLINICAL DATA:  Seizure. EXAM: CT HEAD WITHOUT CONTRAST CT CERVICAL SPINE WITHOUT CONTRAST TECHNIQUE: Multidetector CT imaging of the head and cervical spine was performed following the standard protocol without intravenous contrast. Multiplanar CT image reconstructions of the cervical spine were also generated. COMPARISON:  CT scan of head of April 17, 2015. FINDINGS: CT HEAD FINDINGS Bony calvarium appears intact. Mild diffuse cortical atrophy is noted. No mass effect or midline shift is noted. Ventricular size is within normal limits. There is no evidence of mass lesion, hemorrhage or acute infarction. CT CERVICAL SPINE FINDINGS No fracture is noted. Grade 1 retrolisthesis of C3-4 is noted secondary to degenerative disc disease at this level. Anterior osteophyte formation is also noted at C4-5, C5-6 and C6-7. Mild degenerative disc disease is noted at C6-7. Posterior facet joints are unremarkable. IMPRESSION: Mild diffuse cortical atrophy. No acute intracranial abnormality seen. Multilevel degenerative disc disease. No acute abnormality seen in the cervical spine. Electronically Signed   By: Marijo Conception, M.D.    On: 06/27/2015 17:37   Dg Chest Port 1 View  06/30/2015  CLINICAL DATA:  Respiratory failure, history of lung malignancy. EXAM: PORTABLE CHEST 1 VIEW COMPARISON:  Portable chest x-ray of June 29, 2015 FINDINGS: The right lung is well-expanded. There is stable volume loss on the left with left upper lobe mass and pleural thickening atypically. There is no alveolar infiltrate. The interstitial markings are coarse but stable bilaterally. The heart is normal in size. There remains partial obscuration of the left hemidiaphragm. The endotracheal tube has been advanced and its tip now lies 3.9 cm above the carina. The esophagogastric tube tip projects below the inferior margin of the image. IMPRESSION: Interval repositioning of the endotracheal tube such that its tip is now 4 cm above the carina. There is no significant interval change in the appearance of the chest. Electronically Signed   By: David  Martinique M.D.   On: 06/30/2015 07:03   Dg Chest Port 1 View  06/29/2015  CLINICAL DATA:  Endotracheal tube replacement.  Initial encounter. EXAM: PORTABLE CHEST 1 VIEW COMPARISON:  Radiograph performed earlier today at 4:50 a.m. FINDINGS: The endotracheal tube is seen ending 8 cm above the carina. This could be advanced 4-5 cm. The enteric tube is seen extending below the diaphragm. The left upper lobe mass, hilar retraction and apical opacification are relatively stable. Right basilar airspace opacity may reflect asymmetric pulmonary edema or pneumonia. No pleural effusion or pneumothorax is seen. The cardiomediastinal silhouette is borderline normal in size. No acute osseous abnormalities are identified. IMPRESSION: 1. Endotracheal tube seen ending 8 cm above the carina. This could be advanced 4-5 cm. 2. Right basilar airspace opacity may reflect asymmetric pulmonary edema or pneumonia. 3. Left upper lobe mass, hilar retraction and apical opacification are relatively stable in appearance. These results were called  by telephone at the time of interpretation on 06/29/2015 at 11:37 pm to Nursing in the [facility], who verbally acknowledged these results. Electronically Signed   By: Garald Balding M.D.   On: 06/29/2015 23:41   Dg Chest  Port 1 View  06/29/2015  CLINICAL DATA:  Respiratory failure, endotracheal tube position. EXAM: PORTABLE CHEST 1 VIEW COMPARISON:  06/27/2015 and CT chest 04/21/2015. FINDINGS: Endotracheal tube terminates 4.4 cm above the carina. Nasogastric tube terminates in the stomach. Heart size stable. Left upper lobe masslike consolidation, hilar retraction and apical opacification appear grossly stable. Mild right basilar airspace opacification. Tiny left pleural effusion or pleural thickening. Right apical pleural thickening. IMPRESSION: 1. Left upper lobe mass and associated treatment changes in the apical left hemi thorax. 2. Bibasilar airspace opacification, possibly due to an infectious bronchiolitis or bronchopneumonia. Electronically Signed   By: Lorin Picket M.D.   On: 06/29/2015 07:20   Dg Chest Portable 1 View  06/27/2015  CLINICAL DATA:  Endotracheal tube placement.  Seizures. EXAM: PORTABLE CHEST 1 VIEW COMPARISON:  April 17, 2015. FINDINGS: Stable cardiomediastinal silhouette. Endotracheal tube is seen projected over tracheal air shadow with distal tip approximately 2 cm above the carina. No pneumothorax is noted. Left upper lobe mass is significantly smaller compared to prior exam. Bony thorax is unremarkable. IMPRESSION: Endotracheal tube in grossly good position. Left upper lobe mass is significantly smaller compared to prior exam. Electronically Signed   By: Marijo Conception, M.D.   On: 06/27/2015 15:55   Dg Swallowing Func-speech Pathology  07/02/2015  Objective Swallowing Evaluation:   Patient Details Name: GILL DELROSSI MRN: [id number] Date of Birth: [birth year] Today's Date: 07/02/2015 Time: SLP Start Time (ACUTE ONLY): 1215-SLP Stop Time (ACUTE ONLY): 1245  SLP Time Calculation (min) (ACUTE ONLY): 30 min Past Medical History: Past Medical History Diagnosis Date . Mental disorder    sczizophrenia;moderate retardation . Depression  . DEMENTIA  . Hypertension    no medications, no documented history per caregiver at preadmission . GERD (gastroesophageal reflux disease)  . Seizures ([place])  . Developmental disability    developmentaly delayed . Hx of radiation therapy 11/07/13-12/24/13   lung,60Gy/41fx . Mental retardation  . Seizure disorder ([place])  . Lung cancer ([place])  . Encounter for antineoplastic immunotherapy 04/05/2015 Past Surgical History: Past Surgical History Procedure Laterality Date . Cataract extraction w/phaco  07/12/2011   Procedure: CATARACT EXTRACTION PHACO AND INTRAOCULAR LENS PLACEMENT (IOC);  Surgeon: Adonis Brook;  Location: [place] [facility];  Service: Ophthalmology;  Laterality: Left; Marland Kitchen Eye surgery     L eye . Video bronchoscopy Bilateral 10/01/2013   Procedure: VIDEO BRONCHOSCOPY WITHOUT FLUORO;  Surgeon: Collene Gobble, MD;  Location: [place];  Service: Cardiopulmonary;  Laterality: Bilateral; HPI: Other Pertinent Information: 64 Y/O with PMH of seizures, dementia, intellectual disability, lung cancer x/p chemo-XRT 12/2013, presents with breakthrough seizures. He was recently admitted in august with status epilepticus. He is intubated in ED for resp protection.  Pt intubated from 10/23-10/26.  Imaging of chest showed right base opacity- ? edema vs pna, upper left mass.  Pt does have h/o silent aspiration of thin liquids prior to and after swallow on MBS conducted 10/2014 when pt admitted for seizures.  Recomendations were for dys2/nectar diet at that time.   No Data Recorded Assessment / Plan / Recommendation CHL IP CLINICAL IMPRESSIONS 07/02/2015 Therapy Diagnosis Moderate oral phase dysphagia;Moderate pharyngeal phase dysphagia Clinical Impression Pt presents with swallow function very similar to prior MBS. Oral phase characterized by impulsive, rapid intake,  though also with slow oral transit with a brief hold if instructed to take single sips. Pts ability to masticate is severely impaired due to poor dentition; with solids pt initiates attempt to masticate, but solid piece  of cracker lodged in the valleculae. Oropharyngeal phase moderately impaired with trace silent penetration and eventual aspiration of thin liquids due to decreased airway closure and residuals. Structural deficits (large epiglottis impeded by apperance of bony protrustions of the cervical spine, no radiologist present to confirm) are the primary cause of pharyngeal impairment and suggest that even at baseline pt is like to have frequent trace aspiration events. Recommend a dys 1 (puree) texture with nectar thick liquids to reduce risk during acute stay. If pt would carry out an intermittent throat clear or improve mastication efforts, upgrade could be considered, unless family favors diet for pleasure and comfort with known risk. Will follow for tolerance.    CHL IP TREATMENT RECOMMENDATION 07/02/2015 Treatment Recommendations Therapy as outlined in treatment plan below   CHL IP DIET RECOMMENDATION 07/02/2015 SLP Diet Recommendations Dysphagia 1 (Puree);Nectar Liquid Administration via (None) Medication Administration Crushed with puree Compensations Slow rate;Small sips/bites;Clear throat intermittently Postural Changes and/or Swallow Maneuvers (None)   CHL IP OTHER RECOMMENDATIONS 07/02/2015 Recommended Consults (None) Oral Care Recommendations Oral care BID Other Recommendations (None)   CHL IP FOLLOW UP RECOMMENDATIONS 10/10/2014 Follow up Recommendations Inpatient Rehab;24 hour supervision/assistance   CHL IP FREQUENCY AND DURATION 07/02/2015 Speech Therapy Frequency (ACUTE ONLY) min 2x/week Treatment Duration 2 weeks   Pertinent Vitals/Pain NA    Herbie Baltimore, MA CCC-SLP [phone] [place], Katherene Ponto 07/02/2015, 1:53 PM    Microbiology: Recent Results (from the past 240 hour(s))  MRSA  PCR Screening     Status: None   Collection Time: 06/27/15  7:00 PM  Result Value Ref Range Status   MRSA by PCR NEGATIVE NEGATIVE Final    Comment:        The GeneXpert MRSA Assay (FDA approved for NASAL specimens only), is one component of a comprehensive MRSA colonization surveillance program. It is not intended to diagnose MRSA infection nor to guide or monitor treatment for MRSA infections.   Culture, blood (x 2)     Status: None   Collection Time: 06/27/15 11:20 PM  Result Value Ref Range Status   Specimen Description BLOOD BLOOD LEFT HAND  Final   Special Requests BOTTLES DRAWN AEROBIC ONLY 10CC  Final   Culture   Final    NO GROWTH 5 DAYS Performed at [facility]    Report Status 07/03/2015 FINAL  Final  Culture, blood (x 2)     Status: None   Collection Time: 06/27/15 11:23 PM  Result Value Ref Range Status   Specimen Description BLOOD BLOOD RIGHT ARM  Final   Special Requests BOTTLES DRAWN AEROBIC AND ANAEROBIC 5CC  Final   Culture   Final    NO GROWTH 5 DAYS Performed at [facility]    Report Status 07/03/2015 FINAL  Final  CSF culture     Status: None   Collection Time: 06/29/15 10:46 AM  Result Value Ref Range Status   Specimen Description CSF  Final   Special Requests Normal  Final   Gram Stain   Final    CYTOSPIN WBC PRESENT, PREDOMINANTLY PMN NO ORGANISMS SEEN Gram Stain Report Called to,Read Back By and Verified With: ARNOLD RN AT 1140 ON 10.25.16 BY SHUEA    Culture   Final    NO GROWTH 3 DAYS Performed at [facility]    Report Status 07/02/2015 FINAL  Final     Labs: Basic Metabolic Panel:  Recent Labs Lab 06/29/15 0353 06/30/15 0340 07/01/15 0540 07/03/15 0350 07/04/15 0341 07/04/15  2589 07/05/15 0502  NA 146* 141 140 140  --  143 144  K 3.2* 3.5 3.7 4.4  --  3.2* 3.0*  CL 116* 112* 110 107  --  110 110  CO2 $Re'24 23 26 28  'UvC$ --  27 27  GLUCOSE 104* 109* 102* 103*  --  110* 106*  BUN 8 5* <5* 7  --  7 8   CREATININE 1.10 0.88 0.90 0.84 0.80 0.82 0.85  CALCIUM 7.9* 7.8* 8.2* 8.3*  --  8.6* 8.5*  MG 1.8 1.9  --   --   --   --   --   PHOS 2.5  --   --   --   --   --   --    Liver Function Tests: No results for input(s): AST, ALT, ALKPHOS, BILITOT, PROT, ALBUMIN in the last 168 hours. No results for input(s): LIPASE, AMYLASE in the last 168 hours. No results for input(s): AMMONIA in the last 168 hours. CBC:  Recent Labs Lab 06/29/15 0353 06/30/15 0340 07/01/15 0540 07/04/15 0635 07/05/15 0502  WBC 8.9 8.3 7.8 6.7 5.2  HGB 11.8* 10.4* 11.3* 11.1* 11.0*  HCT 37.6* 32.1* 34.8* 34.3* 34.2*  MCV 90.2 89.2 87.4 86.2 86.1  PLT 148* 132* 166 195 217   Cardiac Enzymes:  Recent Labs Lab 06/30/15 0340 06/30/15 1232  CKTOTAL 703* 1169*   BNP: BNP (last 3 results) No results for input(s): BNP in the last 8760 hours.  ProBNP (last 3 results) No results for input(s): PROBNP in the last 8760 hours.  CBG:  Recent Labs Lab 07/04/15 2008 07/04/15 2152 07/05/15 0007 07/05/15 0409 07/05/15 0742  GLUCAP 64* 97 100* 104* 97

## 2015-07-05 NOTE — Telephone Encounter (Signed)
Marco [name] FROM [facility] CALLED TO CX LAB/TX FOR TOMORROW DUE TO PATIENT BEING D/C FROM HOSP TODAY AND THEY DO NOT KNOW IF HE WILL BE UP TO COMING TOMORROW. Marco [name] WAS FORWARDED TO THE DESK NURSE. NO CHANGES MADE TO APPOINTMENT.

## 2015-07-05 NOTE — Progress Notes (Signed)
Physical Therapy Treatment Patient Details Name: Marco [name] MRN: [id number] DOB: 1951/02/26 Today's Date: 07/05/2015    History of Present Illness 64 yo male admitted with Sz, respiratory failure requiring intubation, LP 10/25. Extubated 10/26. Hx of developmental disability, Sz d/o, dementia, HTN, lung cancer. Pt is from a group home.    PT Comments    Progressing with mobility but remains unsteady. Group home rep present during session-explained that therapy continues to recommend 24 hour supervision for safety due to risk for falls. Also continue to recommend HHPT follow up   Follow Up Recommendations  Home health PT;Supervision/Assistance - 24 hour     Equipment Recommendations  None recommended by PT    Recommendations for Other Services       Precautions / Restrictions Precautions Precautions: Fall Precaution Comments: sz Restrictions Weight Bearing Restrictions: No    Mobility  Bed Mobility Overal bed mobility: Needs Assistance Bed Mobility: Supine to Sit;Sit to Supine     Supine to sit: Supervision Sit to supine: Supervision   General bed mobility comments: for safety  Transfers Overall transfer level: Needs assistance   Transfers: Sit to/from Stand Sit to Stand: Supervision         General transfer comment: close supervision for safety.   Ambulation/Gait Ambulation/Gait assistance: Min assist Ambulation Distance (Feet): 450 Feet Assistive device: None Gait Pattern/deviations: Step-through pattern;Decreased stride length;Staggering left;Staggering right;Drifts right/left     General Gait Details: Intermittent Min assist for unsteadiness, stumbling. Pt moves quickly and is sometimes out of control. Tolerated distance well.    Stairs            Wheelchair Mobility    Modified Rankin (Stroke Patients Only)       Balance           Standing balance support: Bilateral upper extremity supported;During functional  activity Standing balance-Leahy Scale: Fair                      Cognition Arousal/Alertness: Awake/alert Behavior During Therapy: Impulsive Overall Cognitive Status: History of cognitive impairments - at baseline                      Exercises      General Comments        Pertinent Vitals/Pain Pain Assessment: No/denies pain    Home Living                      Prior Function            PT Goals (current goals can now be found in the care plan section) Progress towards PT goals: Progressing toward goals    Frequency  Min 3X/week    PT Plan Current plan remains appropriate    Co-evaluation             End of Session Equipment Utilized During Treatment: Gait belt Activity Tolerance: Patient tolerated treatment well Patient left: in bed;with call bell/phone within reach;with family/visitor present;with nursing/sitter in room     Time: 1200-1209 PT Time Calculation (min) (ACUTE ONLY): 9 min  Charges:  $Gait Training: 8-22 mins                    G Codes:      Weston Anna, MPT Pager: [phone]

## 2015-07-05 NOTE — Progress Notes (Signed)
Pt discharged via wheelchair with group home employee. Discharge instructions were reviewed with the group home coordinator. Reviewed the medications with coordinator. No questions from employee or coordinator at this time.  Vann Okerlund W Millee Denise, RN

## 2015-07-05 NOTE — Telephone Encounter (Addendum)
asking about treatment appt tomorrow. Marco [name] I told [name] that chemo is cancelled for tomorrow and will be r/s . Onc tx reqeust sent

## 2015-07-05 NOTE — NC FL2 (Signed)
[place] LEVEL OF CARE SCREENING TOOL     IDENTIFICATION  Patient Name: Marco [name] Birthdate: Apr 14, 1951 Sex: male Admission Date (Current Location): 06/27/2015  [place] and [place] Number:  Sports coach )   Facility and Address:  [facility],  [place] [address] [place]      Provider Number: [id number]  Attending Physician Name and Address:  Robbie Lis, MD  Relative Name and Phone Number:       Current Level of Care: Hospital Recommended Level of Care: Other (Comment) (Group Home) Prior Approval Number:    Date Approved/Denied:   PASRR Number:    Discharge Plan: Other (Comment) (Group Home)    Current Diagnoses: Patient Active Problem List   Diagnosis Date Noted  . Malnutrition of moderate degree 07/02/2015  . Sepsis ([place]) 07/01/2015  . UTI (urinary tract infection) 07/01/2015  . ARF (acute renal failure) ([place]) 07/01/2015  . Dementia without behavioral disturbance 07/01/2015  . Leukocytosis 07/01/2015  . Hypokalemia 07/01/2015  . Hypernatremia 07/01/2015  . Convulsions/seizures ([place]) 05/20/2015  . Non-small cell lung cancer ([place])   . Acute respiratory failure with hypoxia ([place])   . IQ 20-34 (severe mental retardation) 10/29/2012    Orientation ACTIVITIES/SOCIAL BLADDER RESPIRATION    Self, Place  Active Incontinent Normal  BEHAVIORAL SYMPTOMS/MOOD NEUROLOGICAL BOWEL NUTRITION STATUS    Convulsions/Seizures Continent Diet (Low Sodium Heart Healty)  PHYSICIAN VISITS COMMUNICATION OF NEEDS Height & Weight Skin    Verbally '6\' 1"'$  (185.4 cm) 154 lbs. Normal          AMBULATORY STATUS RESPIRATION    Supervision limited Normal      Personal Care Assistance Level of Assistance  Bathing, Dressing Bathing Assistance: Limited assistance Feeding assistance: Independent Dressing Assistance: Limited assistance      Functional Limitations Info  Speech     Speech Info: Impaired       SPECIAL CARE FACTORS  FREQUENCY  PT (By licensed PT), Speech therapy     PT Frequency:  (3x's a week)       Speech Therapy Frequency:  (3x's a week)     Additional Factors Info  Psychotropic Code Status Info: Full   Psychotropic Info: ativan, risperdal, and desyrel         Current Medications (07/05/2015): Current Facility-Administered Medications  Medication Dose Route Frequency Provider Last Rate Last Dose  . 0.9 %  sodium chloride infusion  250 mL Intravenous PRN Dimas Chyle, MD 10 mL/hr at 07/04/15 1000 250 mL at 07/04/15 1000  . acetaminophen (TYLENOL) suppository 650 mg  650 mg Rectal Q6H PRN Dianne Dun, NP      . antiseptic oral rinse solution (CORINZ)  7 mL Mouth Rinse BID Robbie Lis, MD   7 mL at 07/05/15 1030  . bisacodyl (DULCOLAX) suppository 10 mg  10 mg Rectal Daily PRN Dianne Dun, NP      . chlorhexidine gluconate (PERIDEX) 0.12 % solution 15 mL  15 mL Mouth Rinse BID Brand Males, MD   15 mL at 07/05/15 0834  . dextrose 5 %-0.9 % sodium chloride infusion   Intravenous Continuous Robbie Lis, MD 30 mL/hr at 07/05/15 1144    . donepezil (ARICEPT) tablet 10 mg  10 mg Oral QHS Donita Brooks, NP   10 mg at 07/04/15 2109  . enoxaparin (LOVENOX) injection 40 mg  40 mg Subcutaneous Q24H Dimas Chyle, MD   40 mg at 07/04/15 2050  . famotidine (PEPCID) tablet  20 mg  20 mg Oral BID Robbie Lis, MD   20 mg at 07/05/15 1030  . fluticasone (FLONASE) 50 MCG/ACT nasal spray 2 spray  2 spray Each Nare Daily Donita Brooks, NP   2 spray at 07/05/15 1030  . lacosamide (VIMPAT) tablet 100 mg  100 mg Oral BID Robbie Lis, MD   100 mg at 07/05/15 1029  . levETIRAcetam (KEPPRA) tablet 1,500 mg  1,500 mg Oral BID Robbie Lis, MD   1,500 mg at 07/05/15 1029  . LORazepam (ATIVAN) injection 0.5 mg  0.5 mg Intravenous Once Dianne Dun, NP   0.5 mg at 07/02/15 0645  . LORazepam (ATIVAN) tablet 1 mg  1 mg Oral QHS Robbie Lis, MD   1 mg at 07/04/15 2110  . RESOURCE  THICKENUP CLEAR   Oral PRN Robbie Lis, MD      . risperiDONE (RISPERDAL) tablet 3 mg  3 mg Oral QHS Donita Brooks, NP   3 mg at 07/04/15 2118  . traZODone (DESYREL) tablet 25 mg  25 mg Oral QHS Donita Brooks, NP   25 mg at 07/04/15 2109   Do not use this list as official medication orders. Please verify with discharge summary.  Discharge Medications:   Medication List    STOP taking these medications        omeprazole 20 MG capsule  Commonly known as:  PRILOSEC      TAKE these medications        bisacodyl 5 MG EC tablet  Commonly known as:  DULCOLAX  Take 5 mg by mouth daily as needed for moderate constipation (constipation).     donepezil 10 MG tablet  Commonly known as:  ARICEPT  Take 10 mg by mouth at bedtime.     erythromycin ophthalmic ointment  Place 1 application into the left eye 3 (three) times daily.     famotidine 20 MG tablet  Commonly known as:  PEPCID  Take 1 tablet (20 mg total) by mouth 2 (two) times daily.     fluticasone 50 MCG/ACT nasal spray  Commonly known as:  FLONASE  Place 2 sprays into both nostrils daily.     HYDROcodone-homatropine 5-1.5 MG/5ML syrup  Commonly known as:  HYCODAN  Take 5 mLs by mouth every 6 (six) hours as needed for cough.     Lacosamide 100 MG Tabs  Take 1 tablet (100 mg total) by mouth 2 (two) times daily.     levETIRAcetam 750 MG tablet  Commonly known as:  KEPPRA  Take 2 tablets (1,500 mg total) by mouth 2 (two) times daily.     LORazepam 1 MG tablet  Commonly known as:  ATIVAN  Take 2 tablets (2 mg total) by mouth at bedtime.     prochlorperazine 10 MG tablet  Commonly known as:  COMPAZINE  Take 10 mg by mouth every 6 (six) hours as needed for nausea or vomiting.     RESOURCE THICKENUP CLEAR Powd  Take 120 g by mouth as needed.     risperiDONE 3 MG tablet  Commonly known as:  RISPERDAL  Take 1 tablet (3 mg total) by mouth at bedtime.     traZODone 50 MG tablet  Commonly known as:  DESYREL  Take 0.5  tablets (25 mg total) by mouth at bedtime.        Relevant Imaging Results:  Relevant Lab Results:  Recent Labs    Additional Information  Raymondo Band, LCSW

## 2015-07-05 NOTE — Progress Notes (Signed)
Nutrition Follow-up  DOCUMENTATION CODES:   Non-severe (moderate) malnutrition in context of acute illness/injury  INTERVENTION:  - RD will continue to monitor for needs and will order nutrition supplement should pt be unable to d/c today (noted d/c summary and orders in at this time_  NUTRITION DIAGNOSIS:   Inadequate oral intake related to inability to eat as evidenced by NPO status. -resolved  No new nutrition dx.  GOAL:   Patient will meet greater than or equal to 90% of their needs -met on average  MONITOR:   PO intake, Weight trends, Labs, I & O's  ASSESSMENT:   64 year old male with history of seizure presented to [facility] ED 10/23 after having 3 witnessed tonic-clonic seizures at group home where he resides. In ED he required intubation for airway protection.   10/31 New consult received. Noted d/c summary and orders in place at this time. Pt unable to provide substantial information so all information pulled from the chart. Pt with hx of dementia and is from a group home. Diet was advanced at 1302 on 10/28. Since that time pt has been eating 50-100% of meals.   Should pt be unable to d/c , will order Magic Cup; noted nectar-thick liquids ordered for pt.   Mainly meeting minimal needs at this time based on documented intakes. Medications reviewed. Labs reviewed; CBGs: 64-116 mg/dL, K: 3 mmol/L, Ca: 8.5 mg/dL.   10/28 - Pt extubated 10/26 PM; needs re-estimated based on this.  - Pt seen by SLP yesterday (10/27) who continues to recommend NPO status.  - MBS is scheduled for noon today; will monitor for results.  - TF recommendations should pt be unsafe for POs: Jevity 1.5 @ 60 mL/hr to provide 2160 kcal, 92 grams protein, and 1094 mL free water. - No family present and pt unable to answer questions.  - Based on chart review, pt lost 8 lbs (5.5% body weight) from 05/21/15-06/08/15 which is significant for time frame.  - Mild muscle wasting noted to upper body and lower body  not assessed.   10/24 - No family present in room. EEG in progress.  - Will complete nutrition focused physical exam at follow-up. - Patient is currently intubated on ventilator support - MV: 10.8 L/min; Propofol: 16.9 ml/hr -> provides 446 fat kcal     Diet Order:  DIET - DYS 1 Room service appropriate?: Yes; Fluid consistency:: Nectar Thick Diet - low sodium heart healthy  Skin:  Reviewed, no issues  Last BM:  10/29  Height:   Ht Readings from Last 1 Encounters:  06/27/15 6' (1.829 m)    Weight:   Wt Readings from Last 1 Encounters:  07/05/15 154 lb 1.6 oz (69.9 kg)    Ideal Body Weight:  80.9 kg  BMI:  Body mass index is 20.9 kg/(m^2).  Estimated Nutritional Needs:   Kcal:  2100-2300  Protein:  80-90 grams  Fluid:  1.7L/day  EDUCATION NEEDS:   No education needs identified at this time     Jarome Matin, RD, LDN Inpatient Clinical Dietitian Pager # [phone] After hours/weekend pager # [phone]

## 2015-07-05 NOTE — Progress Notes (Signed)
Speech Language Pathology Treatment: Dysphagia  Patient Details Name: Marco [name] MRN: [id number] DOB: Jul 29, 1951 Today's Date: 07/05/2015 Time: 1050-1107 SLP Time Calculation (min) (ACUTE ONLY): 17 min  Assessment / Plan / Recommendation Clinical Impression  Follow up to determine tolerance of diet and educate pt/family to findings of testing.  Pt in room with sitter, baseline significant coughing noted-? On secretions.   Sitter reports pt tolerated his breakfast well.  Provided pt with nectar thick coffee - no s/s of aspiration, lingual thrusting noted c/w pt's with MR.  Pt is impulsive and takes much larger boluses with liquids via straw.  Determined it was easier to control rate/bolus amount with single cup bolus.  Throat clearing to clear penetrates achieved with max cues - however given pt does not have swallow precaution sign in room, doubtful he followed instructions over the weekend.  SLP instructed pt to follow compensation strategies and provided written signs in room for pt/family.  SLP recommends follow up SLP at group home for skilled dysphagia tx/family education.  Pt is at chronic risk of aspiration/dysphagia due to his MR and anatomical structure in pharynx.  Continued dys1/nectar recommended currently until definitive care plan in place.     HPI Other Pertinent Information: 64 Y/O with PMH of seizures, dementia, intellectual disability, lung cancer x/p chemo-XRT 12/2013, presents with breakthrough seizures. He was recently admitted in august with status epilepticus. He is intubated in ED for resp protection.  Pt intubated from 10/23-10/26.  Imaging of chest showed right base opacity- ? edema vs pna, upper left mass.  Pt does have h/o silent aspiration of thin liquids prior to and after swallow on MBS conducted 10/2014 when pt admitted for seizures.  Recomendations were for dys2/nectar diet at that time.     Pertinent Vitals Pain Assessment: No/denies pain  SLP Plan  Continue with  current plan of care    Recommendations Diet recommendations: Dysphagia 1 (puree);Nectar-thick liquid Liquids provided via: Cup;No straw Medication Administration: Crushed with puree Supervision: Patient able to self feed;Full supervision/cueing for compensatory strategies (due to impulsivity) Compensations: Small sips/bites;Slow rate;Check for pocketing;Clear throat intermittently Postural Changes and/or Swallow Maneuvers: Seated upright 90 degrees;Upright 30-60 min after meal              Oral Care Recommendations: Oral care BID Follow up Recommendations: Home health SLP Plan: Continue with current plan of care    [place], [place] [facility] SLP [phone]

## 2015-07-05 NOTE — Progress Notes (Signed)
CSW spoke with group home owner Willaim Sheng) to inform of patient's status. Patient is medically stable and ready for discharge on today. Mrs. [name] expressed some concerns regarding the patient being ready for discharge on today. Per Mrs. [name], she is currently on her way from [place] to [place] and would like to assess the patient's baseline. Advised CSW that she will be in contact once she gets to the hospital. CSW to complete FL2 and fax to facility.   Lucius Conn, [place] Social Worker [place] [phone]

## 2015-07-06 ENCOUNTER — Telehealth: Payer: Self-pay | Admitting: Internal Medicine

## 2015-07-06 ENCOUNTER — Ambulatory Visit: Payer: Medicare Other

## 2015-07-06 ENCOUNTER — Other Ambulatory Visit: Payer: Medicare Other

## 2015-07-06 NOTE — Telephone Encounter (Signed)
Mr. Marco [name] call in and was given appointments for Mr. Marco [name] on 11/8. See previous notes.

## 2015-07-06 NOTE — Telephone Encounter (Signed)
Per 11/1 pof added lab/fu/tx for 11/8. Per pof called [name] at [phone] but was not able to reach him or leave message - voicemail not set up. Will call again tomorrow.

## 2015-07-12 NOTE — Progress Notes (Addendum)
   07/01/15 1623  SLP G-Codes **NOT FOR INPATIENT CLASS**  Functional Assessment Tool Used clinical judgement  Functional Limitations Swallowing  Swallow Current Status (Q2241) CL  Swallow Goal Status (H4643) CK  SLP Evaluations  $ SLP Speech Visit 1 Procedure  SLP Evaluations  $BSS Swallow 1 Procedure  $Self Care/Home Management 8-22  Marco [name], [name] [facility] SLP [phone]

## 2015-07-13 ENCOUNTER — Other Ambulatory Visit (HOSPITAL_BASED_OUTPATIENT_CLINIC_OR_DEPARTMENT_OTHER): Payer: Medicare Other

## 2015-07-13 ENCOUNTER — Ambulatory Visit (HOSPITAL_BASED_OUTPATIENT_CLINIC_OR_DEPARTMENT_OTHER): Payer: Medicare Other | Admitting: Internal Medicine

## 2015-07-13 ENCOUNTER — Telehealth: Payer: Self-pay | Admitting: Internal Medicine

## 2015-07-13 ENCOUNTER — Encounter: Payer: Self-pay | Admitting: Internal Medicine

## 2015-07-13 ENCOUNTER — Ambulatory Visit (HOSPITAL_BASED_OUTPATIENT_CLINIC_OR_DEPARTMENT_OTHER): Payer: Medicare Other

## 2015-07-13 VITALS — BP 109/67 | HR 117 | Temp 98.6°F | Resp 20 | Ht 72.0 in | Wt 137.3 lb

## 2015-07-13 DIAGNOSIS — Z5112 Encounter for antineoplastic immunotherapy: Secondary | ICD-10-CM | POA: Diagnosis not present

## 2015-07-13 DIAGNOSIS — C3492 Malignant neoplasm of unspecified part of left bronchus or lung: Secondary | ICD-10-CM

## 2015-07-13 DIAGNOSIS — Z79899 Other long term (current) drug therapy: Secondary | ICD-10-CM | POA: Diagnosis not present

## 2015-07-13 LAB — CBC WITH DIFFERENTIAL/PLATELET
BASO%: 1.2 % (ref 0.0–2.0)
BASOS ABS: 0.1 10*3/uL (ref 0.0–0.1)
EOS%: 0.3 % (ref 0.0–7.0)
Eosinophils Absolute: 0 10*3/uL (ref 0.0–0.5)
HEMATOCRIT: 44.6 % (ref 38.4–49.9)
HEMOGLOBIN: 14.4 g/dL (ref 13.0–17.1)
LYMPH%: 12.3 % — ABNORMAL LOW (ref 14.0–49.0)
MCH: 28 pg (ref 27.2–33.4)
MCHC: 32.1 g/dL (ref 32.0–36.0)
MCV: 87.2 fL (ref 79.3–98.0)
MONO#: 0.8 10*3/uL (ref 0.1–0.9)
MONO%: 7.3 % (ref 0.0–14.0)
NEUT%: 78.9 % — AB (ref 39.0–75.0)
NEUTROS ABS: 8.4 10*3/uL — AB (ref 1.5–6.5)
Platelets: 314 10*3/uL (ref 140–400)
RBC: 5.12 10*6/uL (ref 4.20–5.82)
RDW: 16.2 % — AB (ref 11.0–14.6)
WBC: 10.7 10*3/uL — ABNORMAL HIGH (ref 4.0–10.3)
lymph#: 1.3 10*3/uL (ref 0.9–3.3)

## 2015-07-13 LAB — COMPREHENSIVE METABOLIC PANEL (CC13)
ALBUMIN: 4.1 g/dL (ref 3.5–5.0)
ALK PHOS: 76 U/L (ref 40–150)
ALT: 9 U/L (ref 0–55)
AST: 8 U/L (ref 5–34)
Anion Gap: 10 mEq/L (ref 3–11)
BUN: 14.6 mg/dL (ref 7.0–26.0)
CO2: 27 mEq/L (ref 22–29)
Calcium: 9.6 mg/dL (ref 8.4–10.4)
Chloride: 107 mEq/L (ref 98–109)
Creatinine: 1.1 mg/dL (ref 0.7–1.3)
EGFR: 85 mL/min/{1.73_m2} — AB (ref >90)
GLUCOSE: 107 mg/dL (ref 70–140)
Potassium: 4.1 mEq/L (ref 3.5–5.1)
SODIUM: 143 meq/L (ref 136–145)
TOTAL PROTEIN: 7.7 g/dL (ref 6.4–8.3)

## 2015-07-13 LAB — TSH CHCC: TSH: 0.998 m[IU]/L (ref 0.320–4.118)

## 2015-07-13 MED ORDER — SODIUM CHLORIDE 0.9 % IV SOLN
Freq: Once | INTRAVENOUS | Status: AC
  Administered 2015-07-13: 14:00:00 via INTRAVENOUS

## 2015-07-13 MED ORDER — SODIUM CHLORIDE 0.9 % IV SOLN
240.0000 mg | Freq: Once | INTRAVENOUS | Status: AC
  Administered 2015-07-13: 240 mg via INTRAVENOUS
  Filled 2015-07-13: qty 24

## 2015-07-13 NOTE — Telephone Encounter (Signed)
Gave and printd appt sched and avs for pt for NOV and DEC

## 2015-07-13 NOTE — Patient Instructions (Signed)
[place] Discharge Instructions for Patients Receiving Chemotherapy  Today you received the following immunotherapy agents: Nivolumab.  If you develop nausea and vomiting that is not controlled by your nausea medication, call the clinic.   BELOW ARE SYMPTOMS THAT SHOULD BE REPORTED IMMEDIATELY:  *FEVER GREATER THAN 100.5 F  *CHILLS WITH OR WITHOUT FEVER  NAUSEA AND VOMITING THAT IS NOT CONTROLLED WITH YOUR NAUSEA MEDICATION  *UNUSUAL SHORTNESS OF BREATH  *UNUSUAL BRUISING OR BLEEDING  TENDERNESS IN MOUTH AND THROAT WITH OR WITHOUT PRESENCE OF ULCERS  *URINARY PROBLEMS  *BOWEL PROBLEMS  UNUSUAL RASH Items with * indicate a potential emergency and should be followed up as soon as possible.  Feel free to call the clinic should you have any questions or concerns. The clinic phone number is [phone].  Please show the [place] at check-in to the Emergency Department and triage nurse.

## 2015-07-13 NOTE — Progress Notes (Signed)
Pt IV blew a couple minutes after starting nivolumab infusion.  IV catheter was kinked and mild swelling at the site with some reported pain. Pt was very frustrated and was tearing his shirt apart forcefully, that he had caused the IV cath to malfunction. No leaking found at the site during assessment. Pt only received 69m of the nivolumab.  Caregiver states that he has had a really bad day today and pt is refusing to have another IV inserted, to finish up his infusion. Pt states that he does not want his treatment anymore today. Pt got up and left infusion. Reminded caregiver to make sure they call to reschedule infusion appt. Notified and left note for Dr. MJulien Nordmann

## 2015-07-13 NOTE — Progress Notes (Signed)
[place] [phone]   [phone]  OFFICE PROGRESS NOTE  [name],[name], MD [address] [place] [address]  DIAGNOSIS: Stage IIA (T2a., N1, M0) non-small cell lung cancer, squamous cell carcinoma presented with central left upper lobe mass as well as hilar lymphadenopathy diagnosed in January of 2015.  PRIOR THERAPY:  1. Concurrent chemoradiation with weekly carboplatin for AUC of 2 and paclitaxel 45 mg/M2, status post 7 cycles. Last cycle was given 12/22/2013  2. Systemic chemotherapy with carboplatin for AUC of 5 and paclitaxel 175 MG/M2 with Neulasta support every 3 weeks. Status post 3 cycles  CURRENT THERAPY: Immunotherapy with Nivolumab 3 mg/kg given every 2 weeks. Status post 8 cycles.  INTERVAL HISTORY: Marco [name] 64 y.o. male returns to the clinic today for followup visit accompanied by his caregiver. The patient is currently undergoing treatment with immunotherapy with Nivolumab status post 8 cycles and tolerating his treatment fairly well. He was recently admitted to [facility] with seizure activity. He is currently on Keppra and Vimpat. His feeling much better today. He denied having any nausea or vomiting, no fever or chills. He denied having any chest pain, shortness of breath, or hemoptysis. He denied having any significant weight loss or night sweats. He is here to start cycle #9 of his treatment.  MEDICAL HISTORY: Past Medical History  Diagnosis Date  . Mental disorder     sczizophrenia;moderate retardation  . Depression   . DEMENTIA   . Hypertension     no medications, no documented history per caregiver at preadmission  . GERD (gastroesophageal reflux disease)   . Seizures ([place])   . Developmental disability     developmentaly delayed  . Hx of radiation therapy 11/07/13-12/24/13    lung,60Gy/56f  . Mental retardation   . [place]izure disorder ([place]   . Lung cancer ([place]   . Encounter for  antineoplastic immunotherapy 04/05/2015    ALLERGIES:  has No Known Allergies.  MEDICATIONS:  Current Outpatient Prescriptions  Medication Sig Dispense Refill  . bisacodyl (DULCOLAX) 5 MG EC tablet Take 5 mg by mouth daily as needed for moderate constipation (constipation).     .Marland Kitchendonepezil (ARICEPT) 10 MG tablet Take 10 mg by mouth at bedtime.     .Marland Kitchenerythromycin ophthalmic ointment Place 1 application into the left eye 3 (three) times daily.     . famotidine (PEPCID) 20 MG tablet Take 1 tablet (20 mg total) by mouth 2 (two) times daily. 60 tablet 0  . fluticasone (FLONASE) 50 MCG/ACT nasal spray Place 2 sprays into both nostrils daily.    .Marland KitchenHYDROcodone-homatropine (HYCODAN) 5-1.5 MG/5ML syrup Take 5 mLs by mouth every 6 (six) hours as needed for cough. 30 mL 0  . lacosamide 100 MG TABS Take 1 tablet (100 mg total) by mouth 2 (two) times daily. 60 tablet 0  . levETIRAcetam (KEPPRA) 750 MG tablet Take 2 tablets (1,500 mg total) by mouth 2 (two) times daily. 120 tablet 11  . LORazepam (ATIVAN) 1 MG tablet Take 2 tablets (2 mg total) by mouth at bedtime. 30 tablet 0  . Maltodextrin-Xanthan Gum (RESOURCE THICKENUP CLEAR) POWD Take 120 g by mouth as needed. 1 Can 0  . prochlorperazine (COMPAZINE) 10 MG tablet Take 10 mg by mouth every 6 (six) hours as needed for nausea or vomiting.    . risperiDONE (RISPERDAL) 3 MG tablet Take 1 tablet (3 mg total) by mouth at bedtime. 3[place]  tablet 0  . traZODone (DESYREL) 50 MG tablet Take 0.5 tablets (25 mg total) by mouth at bedtime. 30 tablet 0   No current facility-administered medications for this visit.    SURGICAL HISTORY:  Past Surgical History  Procedure Laterality Date  . Cataract extraction w/phaco  07/12/2011    Procedure: CATARACT EXTRACTION PHACO AND INTRAOCULAR LENS PLACEMENT (IOC);  Surgeon: Adonis Brook;  Location: [facility];  Service: Ophthalmology;  Laterality: Left;  Marland Kitchen Eye surgery      L eye  . Video bronchoscopy Bilateral 10/01/2013     Procedure: VIDEO BRONCHOSCOPY WITHOUT FLUORO;  Surgeon: Collene Gobble, MD;  Location: [facility];  Service: Cardiopulmonary;  Laterality: Bilateral;    REVIEW OF SYSTEMS:  A comprehensive review of systems was negative except for: Constitutional: positive for fatigue   PHYSICAL EXAMINATION: General appearance: alert, cooperative and no distress Head: Normocephalic, without obvious abnormality, atraumatic Neck: no adenopathy, no JVD, supple, symmetrical, trachea midline and thyroid not enlarged, symmetric, no tenderness/mass/nodules Lymph nodes: Cervical, supraclavicular, and axillary nodes normal. Resp: clear to auscultation bilaterally Back: symmetric, no curvature. ROM normal. No CVA tenderness. Cardio: regular rate and rhythm, S1, S2 normal, no murmur, click, rub or gallop GI: soft, non-tender; bowel sounds normal; no masses,  no organomegaly Extremities: extremities normal, atraumatic, no cyanosis or edema Neurologic: Alert and oriented X 3, normal strength and tone. Normal symmetric reflexes. Normal coordination and gait  ECOG PERFORMANCE STATUS: 1 - Symptomatic but completely ambulatory  There were no vitals taken for this visit.  LABORATORY DATA: Lab Results  Component Value Date   WBC 10.7* 07/13/2015   HGB 14.4 07/13/2015   HCT 44.6 07/13/2015   MCV 87.2 07/13/2015   PLT 314 07/13/2015      Chemistry      Component Value Date/Time   NA 144 07/05/2015 0502   NA 143 06/22/2015 0933   K 3.0* 07/05/2015 0502   K 3.6 06/22/2015 0933   CL 110 07/05/2015 0502   CO2 27 07/05/2015 0502   CO2 28 06/22/2015 0933   BUN 8 07/05/2015 0502   BUN 11.4 06/22/2015 0933   CREATININE 0.85 07/05/2015 0502   CREATININE 1.0 06/22/2015 0933      Component Value Date/Time   CALCIUM 8.5* 07/05/2015 0502   CALCIUM 9.1 06/22/2015 0933   ALKPHOS 68 06/27/2015 1536   ALKPHOS 75 06/22/2015 0933   AST 31 06/27/2015 1536   AST 8 06/22/2015 0933   ALT 10* 06/27/2015 1536   ALT <9  06/22/2015 0933   BILITOT 0.6 06/27/2015 1536   BILITOT 0.33 06/22/2015 0933       RADIOGRAPHIC STUDIES:  ASSESSMENT AND PLAN: This is a very pleasant 64 years old [place] American male with unresectable a stage IIA non-small cell lung cancer. He completed a course of concurrent chemoradiation with weekly carboplatin and paclitaxel status post 7 cycles. Has been observation since his treatment in April 2015.  His restaging CT scan of the chest showed further enlargement of the left upper lobe suprahilar mass concerning for disease recurrence. The patient was treated with 3 cycles of systemic chemotherapy with carboplatin and paclitaxel but unfortunately has evidence for disease progression. He was started on treatment with immunotherapy with Nivolumab status post 8 cycles and tolerating his treatment fairly well. We will proceed with cycle #9 today as a scheduled. For the cough, he will continue on Hycodan 5 ML by mouth every 6 hours as needed. CODE STATUS was discussed with the patient and  his caregiver and they were not able to make a decision today. He will come back for follow-up visit in 2 weeks for reevaluation before starting cycle #10 after repeating CT scan of the chest for restaging of his disease. He was advised to call immediately if he has any concerning symptoms in the interval. The patient voices understanding of current disease status and treatment options and is in agreement with the current care plan.  All questions were answered. The patient knows to call the clinic with any problems, questions or concerns. We can certainly see the patient much sooner if necessary.  Disclaimer: This note was dictated with voice recognition software. Similar sounding words can inadvertently be transcribed and may not be corrected upon review.

## 2015-07-15 ENCOUNTER — Telehealth: Payer: Self-pay | Admitting: Neurology

## 2015-07-15 ENCOUNTER — Other Ambulatory Visit: Payer: Self-pay | Admitting: Neurology

## 2015-07-15 ENCOUNTER — Encounter: Payer: Self-pay | Admitting: *Deleted

## 2015-07-15 ENCOUNTER — Other Ambulatory Visit: Payer: Self-pay

## 2015-07-15 MED ORDER — LACOSAMIDE 100 MG PO TABS: 100.0000 mg | ORAL_TABLET | Freq: Two times a day (BID) | ORAL | Status: DC

## 2015-07-15 MED ORDER — LEVETIRACETAM 750 MG PO TABS: 1500.0000 mg | ORAL_TABLET | Freq: Two times a day (BID) | ORAL | Status: DC

## 2015-07-15 MED ORDER — HYDROCODONE-HOMATROPINE 5-1.5 MG/5ML PO SYRP: 5.0000 mL | ORAL_SOLUTION | Freq: Four times a day (QID) | ORAL | Status: DC | PRN

## 2015-07-15 NOTE — Progress Notes (Signed)
Faxed request back to [facility] for lacosamide and keppra. Received POA paperwork and will send to MR to be scanned. Faxed to [phone]. Received fax confirmation.

## 2015-07-15 NOTE — Telephone Encounter (Signed)
[facility] [phone] or [phone] called regarding Marco [name] prescription. Pharmacy states it's not showing up there. Dr. Mckinley Jewel states it's not showing up as a Rx in the computer. [name] advised that we are unable to release information to him due to no DPR on file.

## 2015-07-15 NOTE — Progress Notes (Signed)
   07/01/15 1541  PT Time Calculation  PT Start Time (ACUTE ONLY) 1436  PT Stop Time (ACUTE ONLY) 1500  PT Time Calculation (min) (ACUTE ONLY) 24 min  PT G-Codes **NOT FOR INPATIENT CLASS**  Functional Assessment Tool Used (clinical judgement)  Functional Limitation Mobility: Walking and moving around  Mobility: Walking and Moving Around Current Status (D4446) CJ  Mobility: Walking and Moving Around Goal Status (F9012) CI  PT General Charges  $$ ACUTE PT VISIT 1 Procedure  PT Evaluation  $Initial PT Evaluation Tier I 1 Procedure   Weston Anna, MPT [phone]

## 2015-07-15 NOTE — Telephone Encounter (Signed)
Called Marco [name] (listed on DPR). [name] not listed on DPR. Informed her about what was going on. She stated she had the paper copy rx vimpat. She will contact [name] and let him know. I will fax DPR form per [facility] request to [phone] and she will fax back. Received fax confirmation.

## 2015-07-16 ENCOUNTER — Telehealth: Payer: Self-pay | Admitting: *Deleted

## 2015-07-16 NOTE — Telephone Encounter (Signed)
Caretaker called about prescription for cough medicine. He is aware that prescription is available for pick up.

## 2015-07-16 NOTE — Telephone Encounter (Signed)
Hycodan ready for pick up  Caretaker notified.

## 2015-07-20 ENCOUNTER — Other Ambulatory Visit: Payer: Medicare Other

## 2015-07-20 ENCOUNTER — Ambulatory Visit: Payer: Medicare Other | Admitting: Internal Medicine

## 2015-07-26 ENCOUNTER — Ambulatory Visit (HOSPITAL_COMMUNITY)
Admission: RE | Admit: 2015-07-26 | Discharge: 2015-07-26 | Disposition: A | Discharge status: Home / Self Care | Payer: Medicare Other | Source: Ambulatory Visit | Attending: Internal Medicine | Admitting: Internal Medicine

## 2015-07-26 DIAGNOSIS — J984 Other disorders of lung: Secondary | ICD-10-CM | POA: Insufficient documentation

## 2015-07-26 DIAGNOSIS — C3492 Malignant neoplasm of unspecified part of left bronchus or lung: Secondary | ICD-10-CM | POA: Diagnosis not present

## 2015-07-26 DIAGNOSIS — Z923 Personal history of irradiation: Secondary | ICD-10-CM | POA: Diagnosis not present

## 2015-07-26 MED ORDER — IOHEXOL 300 MG/ML  SOLN
75.0000 mL | Freq: Once | INTRAMUSCULAR | Status: AC | PRN
  Administered 2015-07-26: 75 mL via INTRAVENOUS

## 2015-07-27 ENCOUNTER — Ambulatory Visit: Payer: Medicare Other

## 2015-07-27 ENCOUNTER — Other Ambulatory Visit (HOSPITAL_BASED_OUTPATIENT_CLINIC_OR_DEPARTMENT_OTHER): Payer: Medicare Other

## 2015-07-27 ENCOUNTER — Ambulatory Visit (HOSPITAL_BASED_OUTPATIENT_CLINIC_OR_DEPARTMENT_OTHER): Payer: Medicare Other | Admitting: Nurse Practitioner

## 2015-07-27 DIAGNOSIS — C3492 Malignant neoplasm of unspecified part of left bronchus or lung: Secondary | ICD-10-CM

## 2015-07-27 DIAGNOSIS — C3490 Malignant neoplasm of unspecified part of unspecified bronchus or lung: Secondary | ICD-10-CM | POA: Diagnosis present

## 2015-07-27 LAB — COMPREHENSIVE METABOLIC PANEL (CC13)
AST: 9 U/L (ref 5–34)
Albumin: 3.9 g/dL (ref 3.5–5.0)
Alkaline Phosphatase: 77 U/L (ref 40–150)
Anion Gap: 11 mEq/L (ref 3–11)
BUN: 17.9 mg/dL (ref 7.0–26.0)
CHLORIDE: 106 meq/L (ref 98–109)
CO2: 24 meq/L (ref 22–29)
CREATININE: 1.1 mg/dL (ref 0.7–1.3)
Calcium: 9.5 mg/dL (ref 8.4–10.4)
EGFR: 82 mL/min/{1.73_m2} — ABNORMAL LOW (ref >90)
Glucose: 90 mg/dl (ref 70–140)
Potassium: 3.8 mEq/L (ref 3.5–5.1)
Sodium: 141 mEq/L (ref 136–145)
Total Bilirubin: 0.42 mg/dL (ref 0.20–1.20)
Total Protein: 7.4 g/dL (ref 6.4–8.3)

## 2015-07-27 LAB — CBC WITH DIFFERENTIAL/PLATELET
BASO%: 0.7 % (ref 0.0–2.0)
Basophils Absolute: 0 10*3/uL (ref 0.0–0.1)
EOS%: 1.1 % (ref 0.0–7.0)
Eosinophils Absolute: 0.1 10*3/uL (ref 0.0–0.5)
HCT: 44.5 % (ref 38.4–49.9)
HGB: 14.3 g/dL (ref 13.0–17.1)
LYMPH%: 20.5 % (ref 14.0–49.0)
MCH: 28.2 pg (ref 27.2–33.4)
MCHC: 32.1 g/dL (ref 32.0–36.0)
MCV: 87.9 fL (ref 79.3–98.0)
MONO#: 0.5 10*3/uL (ref 0.1–0.9)
MONO%: 7.5 % (ref 0.0–14.0)
NEUT#: 4.7 10*3/uL (ref 1.5–6.5)
NEUT%: 70.2 % (ref 39.0–75.0)
Platelets: 152 10*3/uL (ref 140–400)
RBC: 5.06 10*6/uL (ref 4.20–5.82)
RDW: 15.8 % — ABNORMAL HIGH (ref 11.0–14.6)
WBC: 6.7 10*3/uL (ref 4.0–10.3)
lymph#: 1.4 10*3/uL (ref 0.9–3.3)

## 2015-07-27 MED ORDER — LEVOFLOXACIN 500 MG PO TABS: 500.0000 mg | ORAL_TABLET | Freq: Every day | ORAL | Status: DC

## 2015-07-28 ENCOUNTER — Telehealth: Payer: Self-pay | Admitting: Internal Medicine

## 2015-07-28 ENCOUNTER — Encounter: Payer: Self-pay | Admitting: Nurse Practitioner

## 2015-07-28 NOTE — Progress Notes (Signed)
SYMPTOM MANAGEMENT CLINIC   HPI: Marco [name] 64 y.o. male diagnosed with lung cancer.  Currently undergoing Nivolumab immunotherapy.  Patient has significant history for severe mental retardation.  He presents to the [place] today with his caregiver.  Patient received cycle 9 of his nivolumab immunotherapy on 07/13/2015.  Patient states he has been tolerating his immunotherapy fairly well with minimal side effects.  Restaging CT of the chest was obtained yesterday on 07/26/2015 revealed: IMPRESSION: 5.8 x 3.2 cm medial left upper lobe mass, mildly decreased, likely corresponding to known primary bronchogenic neoplasm.  Surrounding radiation changes with volume loss in the left hemithorax.  Multifocal patchy/nodular opacities in the right lung, suspicious for multifocal pneumonia.  Patient denies any URI or pneumonia-type symptoms.  He states he has a chronic, non-congested, nonproductive cough only.  He denies any recent fevers or chills.  Blood counts obtained today revealed PVCs 6.7, ANC 4.7, hemoglobin 14.3, and platelet count 152.  Dr. [name] in to review all scan results with patient and his health care giver.  Decision was made to halt any further follow-up mammogram immunotherapy for the time being and and to place patient on observation only.  Will cancel all previously scheduled appointments.  Patient will need a restaging chest CT with contrast on or about 10/18/2015.  Patient will then need to return to the [name] on or about the lower 22nd 2016 for labs and a follow-up visit.  Patient and his caregiver knows to call in the interim for any new worries or concerns.  HPI  ROS  Past Medical History  Diagnosis Date  . Mental disorder     sczizophrenia;moderate retardation  . Depression   . DEMENTIA   . Hypertension     no medications, no documented history per caregiver at preadmission  . GERD (gastroesophageal reflux disease)   . Seizures  ([name])   . Developmental disability     developmentaly delayed  . Hx of radiation therapy 11/07/13-12/24/13    lung,60Gy/45fx  . Mental retardation   . Seizure disorder ([name])   . Lung cancer ([name])   . Encounter for antineoplastic immunotherapy 04/05/2015    Past Surgical History  Procedure Laterality Date  . Cataract extraction w/phaco  07/12/2011    Procedure: CATARACT EXTRACTION PHACO AND INTRAOCULAR LENS PLACEMENT (IOC);  Surgeon: Adonis Brook;  Location: [facility];  Service: Ophthalmology;  Laterality: Left;  Marland Kitchen Eye surgery      L eye  . Video bronchoscopy Bilateral 10/01/2013    Procedure: VIDEO BRONCHOSCOPY WITHOUT FLUORO;  Surgeon: Collene Gobble, MD;  Location: [facility];  Service: Cardiopulmonary;  Laterality: Bilateral;    has IQ 20-34 (severe mental retardation); Acute respiratory failure with hypoxia ([name]); Non-small cell lung cancer ([name]); Convulsions/seizures ([place]); Sepsis ([name]); UTI (urinary tract infection); ARF (acute renal failure) ([name]); Dementia without behavioral disturbance; Leukocytosis; Hypokalemia; Hypernatremia; and Malnutrition of moderate degree on his problem list.    has No Known Allergies.    Medication List       This list is accurate as of: 07/27/15 11:59 PM.  Always use your most recent med list.               bisacodyl 5 MG EC tablet  Commonly known as:  DULCOLAX  Take 5 mg by mouth daily as needed for moderate constipation (constipation).     donepezil 10 MG tablet  Commonly known as:  ARICEPT  Take 10 mg by mouth at bedtime.  famotidine 20 MG tablet  Commonly known as:  PEPCID  Take 1 tablet (20 mg total) by mouth 2 (two) times daily.     fluticasone 50 MCG/ACT nasal spray  Commonly known as:  FLONASE  Place 2 sprays into both nostrils daily.     HYDROcodone-homatropine 5-1.5 MG/5ML syrup  Commonly known as:  HYCODAN  Take 5 mLs by mouth every 6 (six) hours as needed for cough.     Lacosamide 100 MG Tabs  Take 1 tablet (100 mg  total) by mouth 2 (two) times daily.     levETIRAcetam 750 MG tablet  Commonly known as:  KEPPRA  Take 2 tablets (1,500 mg total) by mouth 2 (two) times daily.     levofloxacin 500 MG tablet  Commonly known as:  LEVAQUIN  Take 1 tablet (500 mg total) by mouth daily.     LORazepam 1 MG tablet  Commonly known as:  ATIVAN  Take 2 tablets (2 mg total) by mouth at bedtime.     prochlorperazine 10 MG tablet  Commonly known as:  COMPAZINE  Take 10 mg by mouth every 6 (six) hours as needed for nausea or vomiting.     RESOURCE THICKENUP CLEAR Powd  Take 120 g by mouth as needed.     risperiDONE 3 MG tablet  Commonly known as:  RISPERDAL  Take 1 tablet (3 mg total) by mouth at bedtime.     traZODone 50 MG tablet  Commonly known as:  DESYREL  Take 0.5 tablets (25 mg total) by mouth at bedtime.         PHYSICAL EXAMINATION  Oncology Vitals 07/27/2015 07/13/2015  Height 183 cm 183 cm  Weight 64.547 kg 62.279 kg  Weight (lbs) 142 lbs 5 oz 137 lbs 5 oz  BMI (kg/m2) 19.3 kg/m2 18.62 kg/m2  Temp 97.4 98.6  Pulse 108 117  Resp 16 20  SpO2 100 95  BSA (m2) 1.81 m2 1.78 m2   BP Readings from Last 2 Encounters:  07/27/15 104/78  07/13/15 109/67    Physical Exam  Constitutional: He is oriented to person, place, and time and well-developed, well-nourished, and in no distress.  HENT:  Head: Normocephalic and atraumatic.  Patient is missing his top front teeth as baseline.  Eyes: Conjunctivae and EOM are normal. Pupils are equal, round, and reactive to light. Right eye exhibits no discharge. Left eye exhibits no discharge. No scleral icterus.  Neck: Normal range of motion.  Pulmonary/Chest: Effort normal. No respiratory distress.  Musculoskeletal: Normal range of motion.  Neurological: He is alert and oriented to person, place, and time. Gait normal.  Skin: Skin is warm and dry.  Psychiatric: Affect normal.  Nursing note and vitals reviewed.   LABORATORY DATA:. Appointment on  07/27/2015  Component Date Value Ref Range Status  . WBC 07/27/2015 6.7  4.0 - 10.3 10e3/uL Final  . NEUT# 07/27/2015 4.7  1.5 - 6.5 10e3/uL Final  . HGB 07/27/2015 14.3  13.0 - 17.1 g/dL Final  . HCT 07/27/2015 44.5  38.4 - 49.9 % Final  . Platelets 07/27/2015 152  140 - 400 10e3/uL Final  . MCV 07/27/2015 87.9  79.3 - 98.0 fL Final  . MCH 07/27/2015 28.2  27.2 - 33.4 pg Final  . MCHC 07/27/2015 32.1  32.0 - 36.0 g/dL Final  . RBC 07/27/2015 5.06  4.20 - 5.82 10e6/uL Final  . RDW 07/27/2015 15.8* 11.0 - 14.6 % Final  . lymph# 07/27/2015 1.4  0.9 - 3.3  10e3/uL Final  . MONO# 07/27/2015 0.5  0.1 - 0.9 10e3/uL Final  . Eosinophils Absolute 07/27/2015 0.1  0.0 - 0.5 10e3/uL Final  . Basophils Absolute 07/27/2015 0.0  0.0 - 0.1 10e3/uL Final  . NEUT% 07/27/2015 70.2  39.0 - 75.0 % Final  . LYMPH% 07/27/2015 20.5  14.0 - 49.0 % Final  . MONO% 07/27/2015 7.5  0.0 - 14.0 % Final  . EOS% 07/27/2015 1.1  0.0 - 7.0 % Final  . BASO% 07/27/2015 0.7  0.0 - 2.0 % Final  . Sodium 07/27/2015 141  136 - 145 mEq/L Final  . Potassium 07/27/2015 3.8  3.5 - 5.1 mEq/L Final  . Chloride 07/27/2015 106  98 - 109 mEq/L Final  . CO2 07/27/2015 24  22 - 29 mEq/L Final  . Glucose 07/27/2015 90  70 - 140 mg/dl Final   Glucose reference range is for nonfasting patients. Fasting glucose reference range is 70- 100.  Marland Kitchen BUN 07/27/2015 17.9  7.0 - 26.0 mg/dL Final  . Creatinine 07/27/2015 1.1  0.7 - 1.3 mg/dL Final  . Total Bilirubin 07/27/2015 0.42  0.20 - 1.20 mg/dL Final  . Alkaline Phosphatase 07/27/2015 77  40 - 150 U/L Final  . AST 07/27/2015 9  5 - 34 U/L Final  . ALT 07/27/2015 <9  0 - 55 U/L Final  . Total Protein 07/27/2015 7.4  6.4 - 8.3 g/dL Final  . Albumin 07/27/2015 3.9  3.5 - 5.0 g/dL Final  . Calcium 07/27/2015 9.5  8.4 - 10.4 mg/dL Final  . Anion Gap 07/27/2015 11  3 - 11 mEq/L Final  . EGFR 07/27/2015 82* >90 ml/min/1.73 m2 Final   eGFR is calculated using the CKD-EPI Creatinine Equation (2009)      RADIOGRAPHIC STUDIES: Ct Chest W Contrast  07/26/2015  CLINICAL DATA:  Left left non-small-cell lung cancer, diagnosed 12/2013, chemotherapy and XRT complete EXAM: CT CHEST WITH CONTRAST TECHNIQUE: Multidetector CT imaging of the chest was performed during intravenous contrast administration. CONTRAST:  27mL OMNIPAQUE IOHEXOL 300 MG/ML  SOLN COMPARISON:  04/21/2015 FINDINGS: Mediastinum/Nodes: The heart is normal in size. Small pericardial effusion. Mild atherosclerotic calcifications of the aortic arch. No suspicious mediastinal, hilar, or axillary lymphadenopathy. Visualized thyroid is unremarkable. Lungs/Pleura: 5.8 x 3.2 cm medial left upper lobe mass (series 2/image 20), previously 5.8 x 3.7 cm, likely corresponding to known lung cancer. Surrounding radiation changes in the left perihilar/ paramediastinal region, now more coalescent with the dominant mass. Associated volume loss in the left hemithorax. Multifocal patchy/nodular opacities in the right lung, suspicious for multifocal pneumonia. Previously this appearance was isolated to the dependent right upper and lower lobes. 4 mm nodule in the right lower lobe along the major fissure (series 5/image 25), unchanged. Underlying mild centrilobular and paraseptal emphysematous changes. Prior left pleural effusion has resolved.  No pneumothorax. Upper abdomen: Visualized upper abdomen is notable for the 2.0 cm posterior right upper pole renal cyst (series 2/ image 61). Musculoskeletal: Mild degenerative changes of the visualized thoracolumbar spine. IMPRESSION: 5.8 x 3.2 cm medial left upper lobe mass, mildly decreased, likely corresponding to known primary bronchogenic neoplasm. Surrounding radiation changes with volume loss in the left hemithorax. Multifocal patchy/nodular opacities in the right lung, suspicious for multifocal pneumonia. Electronically Signed   By: Julian Hy M.D.   On: 07/26/2015 11:16    ASSESSMENT/PLAN:    Non-small cell  lung cancer [facility]) Patient received cycle 9 of his nivolumab immunotherapy on 07/13/2015.  Patient states he has been  tolerating his immunotherapy fairly well with minimal side effects.  Restaging CT of the chest was obtained yesterday on 07/26/2015 revealed: IMPRESSION: 5.8 x 3.2 cm medial left upper lobe mass, mildly decreased, likely corresponding to known primary bronchogenic neoplasm.  Surrounding radiation changes with volume loss in the left hemithorax.  Multifocal patchy/nodular opacities in the right lung, suspicious for multifocal pneumonia.  Patient denies any URI or pneumonia-type symptoms.  He states he has a chronic, non-congested, nonproductive cough only.  He denies any recent fevers or chills.  Blood counts obtained today revealed PVCs 6.7, ANC 4.7, hemoglobin 14.3, and platelet count 152.  Dr. [name] in to review all scan results with patient and his health care giver.  Will prescribe Levaquin antibiotics for a total of 7 days for treatment of any possible pneumonia-type infection.  Decision was made to halt any further follow-up mammogram immunotherapy for the time being and and to place patient on observation only.  Will cancel all previously scheduled appointments.  Patient will need a restaging chest CT with contrast on or about 10/18/2015.  Patient will then need to return to the [facility] [place] on or about the lower 22nd 2016 for labs and a follow-up visit.  Patient and his caregiver knows to call in the interim for any new worries or concerns.   Patient stated understanding of all instructions; and was in agreement with this plan of care. The patient knows to call the clinic with any problems, questions or concerns.   This was a shared visit with Dr. [name] today.  Total time spent with patient was 25 minutes;  with greater than 75 percent of that time spent in face to face counseling regarding patient's symptoms,  and coordination of care and follow  up.  Disclaimer:This dictation was prepared with Dragon/digital dictation along with [employer]. Any transcriptional errors that result from this process are unintentional.  Drue Second, NP 07/28/2015   ADDENDUM: Hematology/Oncology Attending: I had a face to face encounter with the patient. I recommended his care plan. This is a very pleasant 64 years old African-American male with recurrent non-small cell lung cancer, squamous cell carcinoma. The patient was recently treated with immunotherapy with Nivolumab status post 9 cycles. It has been very challenging to convince the patient to sit on the chair for his treatment and he left several times without receiving treatment after mixing his chemotherapy bags. The patient was consider unsafe to continue with further treatment at this point. His most recent CT scan of the chest showed no evidence for disease progression. I discussed the scan results with the patient and his caregiver. The patient is feeling fine and has no complaints. I recommended for him to continue on observation for now with close monitoring and repeat CT scan of the chest in 3 months. He was advised to call immediately if he has any concerning symptoms in the interval.  Disclaimer: This note was dictated with voice recognition software. Similar sounding words can inadvertently be transcribed and may be missed upon review. Eilleen Kempf., MD 07/28/2015

## 2015-07-28 NOTE — Telephone Encounter (Signed)
Spoke with patients caregiver mr wallace and he is aware of all cancelled appointments as well as new ones

## 2015-07-28 NOTE — Assessment & Plan Note (Addendum)
Patient received cycle 9 of his nivolumab immunotherapy on 07/13/2015.  Patient states he has been tolerating his immunotherapy fairly well with minimal side effects.  Restaging CT of the chest was obtained yesterday on 07/26/2015 revealed: IMPRESSION: 5.8 x 3.2 cm medial left upper lobe mass, mildly decreased, likely corresponding to known primary bronchogenic neoplasm.  Surrounding radiation changes with volume loss in the left hemithorax.  Multifocal patchy/nodular opacities in the right lung, suspicious for multifocal pneumonia.  Patient denies any URI or pneumonia-type symptoms.  He states he has a chronic, non-congested, nonproductive cough only.  He denies any recent fevers or chills.  Blood counts obtained today revealed PVCs 6.7, ANC 4.7, hemoglobin 14.3, and platelet count 152.  Dr. [name] in to review all scan results with patient and his health care giver.  Will prescribe Levaquin antibiotics for a total of 7 days for treatment of any possible pneumonia-type infection.  Decision was made to halt any further follow-up mammogram immunotherapy for the time being and and to place patient on observation only.  Will cancel all previously scheduled appointments.  Patient will need a restaging chest CT with contrast on or about 10/18/2015.  Patient will then need to return to the [facility] on or about the lower 22nd 2016 for labs and a follow-up visit.  Patient and his caregiver knows to call in the interim for any new worries or concerns.

## 2015-08-03 ENCOUNTER — Ambulatory Visit: Payer: Medicare Other | Admitting: Internal Medicine

## 2015-08-03 ENCOUNTER — Other Ambulatory Visit: Payer: Medicare Other

## 2015-08-10 ENCOUNTER — Other Ambulatory Visit: Payer: Medicare Other

## 2015-08-10 ENCOUNTER — Ambulatory Visit: Payer: Medicare Other

## 2015-08-10 ENCOUNTER — Ambulatory Visit: Payer: Medicare Other | Admitting: Physician Assistant

## 2015-08-17 ENCOUNTER — Ambulatory Visit: Payer: Medicare Other | Admitting: Internal Medicine

## 2015-08-17 ENCOUNTER — Other Ambulatory Visit: Payer: Medicare Other

## 2015-08-22 ENCOUNTER — Inpatient Hospital Stay (HOSPITAL_COMMUNITY)
Admission: EM | Admit: 2015-08-22 | Discharge: 2015-08-25 | DRG: 101 | Disposition: A | Discharge status: Home / Self Care | Payer: Medicare Other | Attending: Internal Medicine | Admitting: Internal Medicine

## 2015-08-22 ENCOUNTER — Encounter (HOSPITAL_COMMUNITY): Payer: Self-pay | Admitting: Emergency Medicine

## 2015-08-22 ENCOUNTER — Emergency Department (HOSPITAL_COMMUNITY): Payer: Medicare Other

## 2015-08-22 DIAGNOSIS — C3490 Malignant neoplasm of unspecified part of unspecified bronchus or lung: Secondary | ICD-10-CM | POA: Diagnosis not present

## 2015-08-22 DIAGNOSIS — Z79899 Other long term (current) drug therapy: Secondary | ICD-10-CM

## 2015-08-22 DIAGNOSIS — Z9842 Cataract extraction status, left eye: Secondary | ICD-10-CM | POA: Diagnosis not present

## 2015-08-22 DIAGNOSIS — E860 Dehydration: Secondary | ICD-10-CM | POA: Diagnosis present

## 2015-08-22 DIAGNOSIS — G40901 Epilepsy, unspecified, not intractable, with status epilepticus: Principal | ICD-10-CM | POA: Diagnosis present

## 2015-08-22 DIAGNOSIS — Z781 Physical restraint status: Secondary | ICD-10-CM

## 2015-08-22 DIAGNOSIS — Z961 Presence of intraocular lens: Secondary | ICD-10-CM | POA: Diagnosis present

## 2015-08-22 DIAGNOSIS — K219 Gastro-esophageal reflux disease without esophagitis: Secondary | ICD-10-CM | POA: Diagnosis present

## 2015-08-22 DIAGNOSIS — Z87891 Personal history of nicotine dependence: Secondary | ICD-10-CM | POA: Diagnosis not present

## 2015-08-22 DIAGNOSIS — F329 Major depressive disorder, single episode, unspecified: Secondary | ICD-10-CM | POA: Diagnosis present

## 2015-08-22 DIAGNOSIS — E870 Hyperosmolality and hypernatremia: Secondary | ICD-10-CM | POA: Diagnosis present

## 2015-08-22 DIAGNOSIS — F71 Moderate intellectual disabilities: Secondary | ICD-10-CM | POA: Diagnosis present

## 2015-08-22 DIAGNOSIS — D72829 Elevated white blood cell count, unspecified: Secondary | ICD-10-CM | POA: Diagnosis not present

## 2015-08-22 DIAGNOSIS — Z923 Personal history of irradiation: Secondary | ICD-10-CM

## 2015-08-22 DIAGNOSIS — F0390 Unspecified dementia without behavioral disturbance: Secondary | ICD-10-CM | POA: Diagnosis present

## 2015-08-22 DIAGNOSIS — I10 Essential (primary) hypertension: Secondary | ICD-10-CM | POA: Diagnosis present

## 2015-08-22 DIAGNOSIS — R569 Unspecified convulsions: Secondary | ICD-10-CM

## 2015-08-22 DIAGNOSIS — F209 Schizophrenia, unspecified: Secondary | ICD-10-CM | POA: Diagnosis present

## 2015-08-22 LAB — COMPREHENSIVE METABOLIC PANEL
ALBUMIN: 4.6 g/dL (ref 3.5–5.0)
ALT: 10 U/L — ABNORMAL LOW (ref 17–63)
AST: 12 U/L — AB (ref 15–41)
Alkaline Phosphatase: 66 U/L (ref 38–126)
Anion gap: 6 (ref 5–15)
BILIRUBIN TOTAL: 0.6 mg/dL (ref 0.3–1.2)
BUN: 13 mg/dL (ref 6–20)
CHLORIDE: 105 mmol/L (ref 101–111)
CO2: 32 mmol/L (ref 22–32)
Calcium: 9.3 mg/dL (ref 8.9–10.3)
Creatinine, Ser: 1.08 mg/dL (ref 0.61–1.24)
GFR calc Af Amer: >60 mL/min (ref >60)
GFR calc non Af Amer: >60 mL/min (ref >60)
GLUCOSE: 92 mg/dL (ref 65–99)
POTASSIUM: 4 mmol/L (ref 3.5–5.1)
SODIUM: 143 mmol/L (ref 135–145)
Total Protein: 7.8 g/dL (ref 6.5–8.1)

## 2015-08-22 LAB — CBC WITH DIFFERENTIAL/PLATELET
BASOS ABS: 0 10*3/uL (ref 0.0–0.1)
Basophils Relative: 0 %
EOS ABS: 0 10*3/uL (ref 0.0–0.7)
EOS PCT: 0 %
HCT: 44.6 % (ref 39.0–52.0)
Hemoglobin: 14.3 g/dL (ref 13.0–17.0)
LYMPHS PCT: 5 %
Lymphs Abs: 0.7 10*3/uL (ref 0.7–4.0)
MCH: 28.8 pg (ref 26.0–34.0)
MCHC: 32.1 g/dL (ref 30.0–36.0)
MCV: 89.7 fL (ref 78.0–100.0)
Monocytes Absolute: 0.5 10*3/uL (ref 0.1–1.0)
Monocytes Relative: 4 %
Neutro Abs: 11.5 10*3/uL — ABNORMAL HIGH (ref 1.7–7.7)
Neutrophils Relative %: 91 %
PLATELETS: 164 10*3/uL (ref 150–400)
RBC: 4.97 MIL/uL (ref 4.22–5.81)
RDW: 14.2 % (ref 11.5–15.5)
WBC: 12.7 10*3/uL — AB (ref 4.0–10.5)

## 2015-08-22 LAB — URINALYSIS, ROUTINE W REFLEX MICROSCOPIC
Bilirubin Urine: NEGATIVE
Glucose, UA: 100 mg/dL — AB
HGB URINE DIPSTICK: NEGATIVE
Ketones, ur: NEGATIVE mg/dL
LEUKOCYTES UA: NEGATIVE
NITRITE: NEGATIVE
PROTEIN: NEGATIVE mg/dL
Specific Gravity, Urine: 1.021 (ref 1.005–1.030)
pH: 5 (ref 5.0–8.0)

## 2015-08-22 LAB — CBG MONITORING, ED: GLUCOSE-CAPILLARY: 176 mg/dL — AB (ref 65–99)

## 2015-08-22 MED ORDER — LORAZEPAM 2 MG/ML IJ SOLN
2.0000 mg | Freq: Once | INTRAMUSCULAR | Status: AC
  Administered 2015-08-22: 20:00:00 via INTRAVENOUS

## 2015-08-22 MED ORDER — LORAZEPAM 2 MG/ML IJ SOLN
2.0000 mg | Freq: Once | INTRAMUSCULAR | Status: DC
  Filled 2015-08-22: qty 1

## 2015-08-22 MED ORDER — LORAZEPAM 2 MG/ML IJ SOLN
INTRAMUSCULAR | Status: AC
  Filled 2015-08-22: qty 1

## 2015-08-22 MED ORDER — SODIUM CHLORIDE 0.9 % IV SOLN
500.0000 mg | Freq: Once | INTRAVENOUS | Status: AC
  Administered 2015-08-22: 500 mg via INTRAVENOUS
  Filled 2015-08-22 (×2): qty 5

## 2015-08-22 MED ORDER — SODIUM CHLORIDE 0.9 % IV SOLN
200.0000 mg | Freq: Two times a day (BID) | INTRAVENOUS | Status: DC
  Administered 2015-08-22 – 2015-08-23 (×2): 200 mg via INTRAVENOUS
  Filled 2015-08-22 (×2): qty 20

## 2015-08-22 MED ORDER — SODIUM CHLORIDE 0.9 % IV SOLN
500.0000 mg | Freq: Two times a day (BID) | INTRAVENOUS | Status: DC
  Administered 2015-08-22: 500 mg via INTRAVENOUS

## 2015-08-22 NOTE — ED Provider Notes (Addendum)
CSN: [id number]     Arrival date & time 08/22/15  1945 History   First MD Initiated Contact with Patient 08/22/15 2008     Chief Complaint  Patient presents with  . Seizures     (Consider location/radiation/quality/duration/timing/severity/associated sxs/prior Treatment) HPI Comments: Group home worker is present with patient. Patient had been normal all day long for a snack when he laid his head down and began to have generalized shaking consistent with prior seizures. He was given midazolam 2.5 mg by EMS with resolution of the shaking however he has not returned to his baseline. Patient takes Vimpat and Keppra and he has not missed any doses. He also takes Ativan at night and has also not missed any doses. He has been doing well without nausea, vomiting or diarrhea. No recent fevers or infection. As far as the group home worker knows he has been sleeping normally. No recent medication changes. Patient does have a history of one cancer and is currently on chemotherapy. He is been doing well. Since last seizure was at the end of October which required intubation for status epilepticus.  Patient is a 64 y.o. male presenting with seizures. The history is provided by a caregiver (group home worker). The history is limited by the condition of the patient.  Seizures Seizure activity on arrival: yes   Seizure type:  Partial complex Preceding symptoms comment:  Unknown Initial focality:  None Episode characteristics: eye deviation, generalized shaking and unresponsiveness   Postictal symptoms: confusion and somnolence   Return to baseline: no   Severity:  Severe Duration:  1 hour Timing:  Once Progression:  Improving Context: not change in medication, not sleeping less, not emotional upset, medical compliance and not possible hypoglycemia   Recent head injury:  No recent head injuries PTA treatment:  Midazolam History of seizures: yes   Date of most recent prior episode:  06/27/2015 Severity:   Severe Seizure control level:  Poorly controlled Current therapy:  Levetiracetam (vimpat) Compliance with current therapy:  Good   Past Medical History  Diagnosis Date  . Mental disorder     sczizophrenia;moderate retardation  . Depression   . DEMENTIA   . Hypertension     no medications, no documented history per caregiver at preadmission  . GERD (gastroesophageal reflux disease)   . Seizures ([place])   . Developmental disability     developmentaly delayed  . Hx of radiation therapy 11/07/13-12/24/13    lung,60Gy/43f  . Mental retardation   . [place]   . [place]   . Encounter for antineoplastic immunotherapy 04/05/2015   Past Surgical History  Procedure Laterality Date  . Cataract extraction w/phaco  07/12/2011    Procedure: CATARACT EXTRACTION PHACO AND INTRAOCULAR LENS PLACEMENT (IOC);  Surgeon: GAdonis Brook  [facility];  Service: Ophthalmology;  Laterality: Left;  .Marland KitchenEye surgery      L eye  . Video bronchoscopy Bilateral 10/01/2013    Procedure: VIDEO BRONCHOSCOPY WITHOUT FLUORO;  Surgeon: RCollene Gobble MD;  [facility]  Service: Cardiopulmonary;  Laterality: Bilateral;   Family History  Problem Relation Age of Onset  . Hypertension Mother   . Hypertension Father    Social History  Substance Use Topics  . Smoking status: Former Smoker -- 0.50 packs/day for 35 years    Types: Cigarettes    Quit date: 09/07/2013  . Smokeless tobacco: None  . Alcohol Use: No    Review of Systems  Unable to perform ROS:  Other  Neurological: Positive for seizures.      Allergies  Review of patient's allergies indicates no known allergies.  Home Medications   Prior to Admission medications   Medication Sig Start Date End Date Taking? Authorizing Provider  bisacodyl (DULCOLAX) 5 MG EC tablet Take 5 mg by mouth daily as needed for moderate constipation (constipation).    Yes Historical Provider, MD  donepezil (ARICEPT) 10 MG tablet Take 10  mg by mouth at bedtime.    Yes Historical Provider, MD  fluticasone (FLONASE) 50 MCG/ACT nasal spray Place 2 sprays into both nostrils daily.   Yes Historical Provider, MD  HYDROcodone-homatropine (HYCODAN) 5-1.5 MG/5ML syrup Take 5 mLs by mouth every 6 (six) hours as needed for cough. 07/15/15  Yes Curt Bears, MD  Lacosamide 100 MG TABS Take 1 tablet (100 mg total) by mouth 2 (two) times daily. 07/15/15  Yes Melvenia Beam, MD  levETIRAcetam (KEPPRA) 750 MG tablet Take 2 tablets (1,500 mg total) by mouth 2 (two) times daily. 07/15/15  Yes Melvenia Beam, MD  LORazepam (ATIVAN) 1 MG tablet Take 2 tablets (2 mg total) by mouth at bedtime. 07/05/15  Yes Robbie Lis, MD  omeprazole (PRILOSEC) 20 MG capsule Take 20 mg by mouth 2 (two) times daily.   Yes Historical Provider, MD  prochlorperazine (COMPAZINE) 10 MG tablet Take 10 mg by mouth every 6 (six) hours as needed for nausea or vomiting.   Yes Historical Provider, MD  risperiDONE (RISPERDAL) 3 MG tablet Take 1 tablet (3 mg total) by mouth at bedtime. 07/05/15  Yes Robbie Lis, MD  traZODone (DESYREL) 50 MG tablet Take 0.5 tablets (25 mg total) by mouth at bedtime. 07/05/15  Yes Robbie Lis, MD  Maltodextrin-Xanthan Gum (RESOURCE THICKENUP CLEAR) POWD Take 120 g by mouth as needed. 07/05/15   Robbie Lis, MD   BP 151/98 mmHg  Pulse 113  Temp(Src) 95 F (35 C) (Rectal)  Resp 20  Ht '5\' 9"'$  (1.753 m)  Wt 137 lb (62.143 kg)  BMI 20.22 kg/m2  SpO2 95% Physical Exam  Constitutional: He appears well-developed and well-nourished.  HENT:  Head: Normocephalic and atraumatic.  Mouth/Throat: Oropharynx is clear and moist.  Poor dentition  Eyes: Conjunctivae and EOM are normal. Pupils are equal, round, and reactive to light.  Neck: Normal range of motion. Neck supple.  Cardiovascular: Regular rhythm and intact distal pulses.  Tachycardia present.   No murmur heard. Pulmonary/Chest: Effort normal and breath sounds normal. No respiratory  distress. He has no wheezes. He has no rales.  Abdominal: Soft. He exhibits no distension. There is no tenderness. There is no rebound and no guarding.  Musculoskeletal: Normal range of motion. He exhibits no edema or tenderness.  Neurological: He is unresponsive.  Patient appears to still be seizing. Left eye is deviated to the left with mild tremor present. Patient is not responding to voice. He is intermittently moving his upper and lower extremities but not purposefully  Skin: Skin is warm and dry. No rash noted. No erythema.  Psychiatric: He has a normal mood and affect. His behavior is normal.  Nursing note and vitals reviewed.   ED Course  Procedures (including critical care time) Labs Review Labs Reviewed  CBC WITH DIFFERENTIAL/PLATELET - Abnormal; Notable for the following:    WBC 12.7 (*)    Neutro Abs 11.5 (*)    All other components within normal limits  COMPREHENSIVE METABOLIC PANEL - Abnormal; Notable for the following:  AST 12 (*)    ALT 10 (*)    All other components within normal limits  CBG MONITORING, ED - Abnormal; Notable for the following:    Glucose-Capillary 176 (*)    All other components within normal limits  URINALYSIS, ROUTINE W REFLEX MICROSCOPIC (NOT AT [facility])    Imaging Review Dg Chest Port 1 View  08/22/2015  CLINICAL DATA:  History of lung cancer.  Seizure.  Postictal. EXAM: PORTABLE CHEST 1 VIEW COMPARISON:  07/26/2015. FINDINGS: Heart size is mildly enlarged. There is no pleural effusion or edema. Left upper lobe perihilar lung mass is again identified better seen on CT from 07/26/2015. No acute airspace opacification. IMPRESSION: No acute cardiopulmonary abnormalities. Electronically Signed   By: Kerby Moors M.D.   On: 08/22/2015 20:38   I have personally reviewed and evaluated these images and lab results as part of my medical decision-making.   EKG Interpretation   Date/Time:  Sunday August 22 2015 20:01:07 EST Ventricular Rate:   113 PR Interval:  101 QRS Duration: 108 QT Interval:  322 QTC Calculation: 441 R Axis:   67 Text Interpretation:  Sinus tachycardia Probable left atrial enlargement  Nonspecific T abnormalities, lateral leads Baseline wander in lead(s) V3  No significant change since last tracing Confirmed by Maryan Rued  MD,  Loree Fee ([id number]) on 08/22/2015 8:09:31 PM      MDM   Final diagnoses:  Seizure [facility])    Patient is a 64 year old male with a history of severe dental retardation and seizure disorder currently on Vimpat and Keppra presenting today with persistent seizure. In the past patient has a history of status epilepticus requiring admission and intubation. Group home worker states his seizures have become more frequent. He denies any missed doses of medication were new medications however he is still getting chemotherapy for lung cancer however he has been doing well with this without significant side effect. It seems the patient was his normal self today prior to the development of seizure.  Upon arrival after getting 2.5 and the data slammed by EMS patient still appears to be having subclinical sz.  Pt given 2 of ativan and currently appears to be postictal.  Loading with keppra and vimpat per Dr. Nicole Kindred.  Will re-evaluate. Pt had a head CT 2 months ago without signs of mets.  9:53 PM Patient is starting to become more purposeful. Tachycardia is improving.  Currently not still at baseline.  11:07 PM Patient appears to be returning to his baseline. Patient is not hypothermic but temperature is 90.8 degrees. Labs without acute findings. Patient was given the IV dose of this at and Keppra. Dr. Nicole Kindred also recommended increasing his Keppra dose to 1 g twice a day and Vimpat of '150mg'$  bid.  Due to patient's significant seizure history and her recent admissions requiring intubation for status epilepticus will admit overnight for observation to ensure no further seizures and neurology will see him  before discharge home.  CRITICAL CARE Performed by: Blanchie Dessert Total critical care time: 45 minutes Critical care time was exclusive of separately billable procedures and treating other patients. Critical care was necessary to treat or prevent imminent or life-threatening deterioration. Critical care was time spent personally by me on the following activities: development of treatment plan with patient and/or surrogate as well as nursing, discussions with consultants, evaluation of patient's response to treatment, examination of patient, obtaining history from patient or surrogate, ordering and performing treatments and interventions, ordering and review of laboratory studies, ordering and review  of radiographic studies, pulse oximetry and re-evaluation of patient's condition.   Blanchie Dessert, MD 08/22/15 [id number]  Blanchie Dessert, MD 08/22/15 [phone]

## 2015-08-22 NOTE — ED Notes (Signed)
Bed: UV22 Expected date:  Expected time:  Means of arrival:  Comments: EMS seizure

## 2015-08-22 NOTE — H&P (Signed)
[facility] Admission History and Physical       CLANCE BAQUERO NWG:[id number] DOB: 13-Mar-1951 DOA: 08/22/2015  Referring physician: EDP PCP: Leamon Arnt, MD  Specialists:   Chief Complaint: Seizures  HPI: Marco [name] is a 64 y.o. male with a history of Seizures, Lung Cancer LULS/P Radiation Rx on Chemo Rx, Schizophenia, and Cognitive Deficit who was brought to the ED due [facility].   His caretaker provided the history, and patient has not missed any of his medications and is on Keppra and Vimpat Rx.   Patient was administered IV Versed on route to the ED, and in the ED he was loaded with [name], and administerd IV Vimpat and Neurology was consulted.    He was referred for admission.      Review of Systems:  Constitutional: No Weight Loss, No Weight Gain, Night Sweats, Fevers, Chills, Dizziness, Light Headedness, Fatigue, or Generalized Weakness HEENT: No Headaches, Difficulty Swallowing,Tooth/Dental Problems,Sore Throat,  No Sneezing, Rhinitis, Ear Ache, Nasal Congestion, or Post Nasal Drip,  Cardio-vascular:  No Chest pain, Orthopnea, PND, Edema in Lower Extremities, Anasarca, Dizziness, Palpitations  Resp: No Dyspnea, No DOE, No Productive Cough, No Non-Productive Cough, No Hemoptysis, No Wheezing.    GI: No Heartburn, Indigestion, Abdominal Pain, Nausea, Vomiting, Diarrhea, Constipation, Hematemesis, Hematochezia, Melena, Change in Bowel Habits,  Loss of Appetite  GU: No Dysuria, No Change in Color of Urine, No Urgency or Urinary Frequency, No Flank pain.  Musculoskeletal: No Joint Pain or Swelling, No Decreased Range of Motion, No Back Pain.  Neurologic: No Syncope, +Seizures, Muscle Weakness, Paresthesia, Vision Disturbance or Loss, No Diplopia, No Vertigo, No Difficulty Walking,  Skin: No Rash or Lesions. Psych: No Change in Mood or Affect, No Depression or Anxiety, No Memory loss, No Confusion, or Hallucinations   Past Medical  History  Diagnosis Date  . Mental disorder     sczizophrenia;moderate retardation  . Depression   . DEMENTIA   . Hypertension     no medications, no documented history per caregiver at preadmission  . GERD (gastroesophageal reflux disease)   . Seizures ([place])   . Developmental disability     developmentaly delayed  . Hx of radiation therapy 11/07/13-12/24/13    lung,60Gy/44f  . Mental retardation   . [place]   . [place]   . Encounter for antineoplastic immunotherapy 04/05/2015     Past Surgical History  Procedure Laterality Date  . Cataract extraction w/phaco  07/12/2011    Procedure: CATARACT EXTRACTION PHACO AND INTRAOCULAR LENS PLACEMENT (IOC);  Surgeon: GAdonis Brook  [facility];  Service: Ophthalmology;  Laterality: Left;  .Marland KitchenEye surgery      L eye  . Video bronchoscopy Bilateral 10/01/2013    Procedure: VIDEO BRONCHOSCOPY WITHOUT FLUORO;  Surgeon: RCollene Gobble MD;  [facility]  Service: Cardiopulmonary;  Laterality: Bilateral;      Prior to Admission medications   Medication Sig Start Date End Date Taking? Authorizing Provider  bisacodyl (DULCOLAX) 5 MG EC tablet Take 5 mg by mouth daily as needed for moderate constipation (constipation).    Yes Historical Provider, MD  donepezil (ARICEPT) 10 MG tablet Take 10 mg by mouth at bedtime.    Yes Historical Provider, MD  fluticasone (FLONASE) 50 MCG/ACT nasal spray Place 2 sprays into both nostrils daily.   Yes Historical Provider, MD  HYDROcodone-homatropine (HYCODAN) 5-1.5 MG/5ML syrup Take 5 mLs by mouth every 6 (six) hours  as needed for cough. 07/15/15  Yes Curt Bears, MD  Lacosamide 100 MG TABS Take 1 tablet (100 mg total) by mouth 2 (two) times daily. 07/15/15  Yes Melvenia Beam, MD  levETIRAcetam (KEPPRA) 750 MG tablet Take 2 tablets (1,500 mg total) by mouth 2 (two) times daily. 07/15/15  Yes Melvenia Beam, MD  LORazepam (ATIVAN) 1 MG tablet Take 2 tablets (2 mg total) by  mouth at bedtime. 07/05/15  Yes Robbie Lis, MD  omeprazole (PRILOSEC) 20 MG capsule Take 20 mg by mouth 2 (two) times daily.   Yes Historical Provider, MD  prochlorperazine (COMPAZINE) 10 MG tablet Take 10 mg by mouth every 6 (six) hours as needed for nausea or vomiting.   Yes Historical Provider, MD  risperiDONE (RISPERDAL) 3 MG tablet Take 1 tablet (3 mg total) by mouth at bedtime. 07/05/15  Yes Robbie Lis, MD  traZODone (DESYREL) 50 MG tablet Take 0.5 tablets (25 mg total) by mouth at bedtime. 07/05/15  Yes Robbie Lis, MD  Maltodextrin-Xanthan Gum (RESOURCE THICKENUP CLEAR) POWD Take 120 g by mouth as needed. 07/05/15   Robbie Lis, MD     No Known Allergies  Social History:  reports that he quit smoking about 1 years ago. His smoking use included Cigarettes. He has a 17.5 pack-year smoking history. He does not have any smokeless tobacco history on file. He reports that he does not drink alcohol or use illicit drugs.    Family History  Problem Relation Age of Onset  . Hypertension Mother   . Hypertension Father        Physical Exam:  GEN:  Pleasant Thin  64 y.o. African American male examined and in no acute distress; cooperative with exam Filed Vitals:   08/22/15 1957 08/22/15 1959 08/22/15 2023  BP: 151/98    Pulse: 113    Temp:   95 F (35 C)  TempSrc:   Rectal  Resp: 20    Height: '5\' 9"'$  (1.753 m)    Weight: 62.143 kg (137 lb)    SpO2: 85% 95%    Blood pressure 151/98, pulse 113, temperature 95 F (35 C), temperature source Rectal, resp. rate 20, height '5\' 9"'$  (1.753 m), weight 62.143 kg (137 lb), SpO2 95 %. PSYCH: He is alert and oriented x1;  Obtunded HEENT: Normocephalic and Atraumatic, Mucous membranes pink; PERRLA; EOM intact; Fundi:  Benign;  No scleral icterus, Nares: Patent, Oropharynx: Clear, Fair Dentition,    Neck:  FROM, No Cervical Lymphadenopathy nor Thyromegaly or Carotid Bruit; No JVD; Breasts:: Not examined CHEST WALL: No tenderness CHEST:  Normal respiration, clear to auscultation bilaterally HEART: Regular rate and rhythm; no murmurs rubs or gallops BACK: No kyphosis or scoliosis; No CVA tenderness ABDOMEN: Positive Bowel Sounds, Scaphoid, Soft Non-Tender, No Rebound or Guarding; No Masses, No Organomegaly. Rectal Exam: Not done EXTREMITIES: No Cyanosis, Clubbing, or Edema; No Ulcerations. Genitalia: not examined PULSES: 2+ and symmetric SKIN: Normal hydration no rash or ulceration CNS:  Alert and Oriented x 1 Obtunded, No Focal Deficits Vascular: pulses palpable throughout    Labs on Admission:  Basic Metabolic Panel:  Recent Labs Lab 08/22/15 2157  NA 143  K 4.0  CL 105  CO2 32  GLUCOSE 92  BUN 13  CREATININE 1.08  CALCIUM 9.3   Liver Function Tests:  Recent Labs Lab 08/22/15 2157  AST 12*  ALT 10*  ALKPHOS 66  BILITOT 0.6  PROT 7.8  ALBUMIN 4.6   No  results for input(s): LIPASE, AMYLASE in the last 168 hours. No results for input(s): AMMONIA in the last 168 hours. CBC:  Recent Labs Lab 08/22/15 2157  WBC 12.7*  NEUTROABS 11.5*  HGB 14.3  HCT 44.6  MCV 89.7  PLT 164   Cardiac Enzymes: No results for input(s): CKTOTAL, CKMB, CKMBINDEX, TROPONINI in the last 168 hours.  BNP (last 3 results) No results for input(s): BNP in the last 8760 hours.  ProBNP (last 3 results) No results for input(s): PROBNP in the last 8760 hours.  CBG:  Recent Labs Lab 08/22/15 2007  GLUCAP 176*    Radiological Exams on Admission: Dg Chest Port 1 View  08/22/2015  CLINICAL DATA:  History of lung cancer.  Seizure.  Postictal. EXAM: PORTABLE CHEST 1 VIEW COMPARISON:  07/26/2015. FINDINGS: Heart size is mildly enlarged. There is no pleural effusion or edema. Left upper lobe perihilar lung mass is again identified better seen on CT from 07/26/2015. No acute airspace opacification. IMPRESSION: No acute cardiopulmonary abnormalities. Electronically Signed   By: Kerby Moors M.D.   On: 08/22/2015 20:38      EKG: Independently reviewed. Sinus Tachycardia rate 113     Assessment/Plan:      64 y.o. male with  Principal Problem:   1.     Seizure ([place])   IV Keppra and Vimpat   Active Problems:   2.    Squamous cell lung cancer [facility])   On Chemo Rx   Oncologist is Dr [name]     3.    Leukocytosis- Stress Rxn, due to #1 UA negative for Infection   Monitor Trend     4.    DVT Prophylaxis   Lovenox     Code Status:     FULL CODE Family Communication:    No Family Present    Disposition Plan:    Inpatient  Status        Time spent:  [facility] Pager [phone]   If [place] Please Contact the Day Rounding Team MD for [facility]  If 7PM-7AM, Please Contact Night-Floor Coverage  [email or web address] Password [email or web address] 08/22/2015, 11:42 PM     ADDENDUM:   Patient was seen and examined on 08/22/2015

## 2015-08-22 NOTE — ED Notes (Signed)
Pt actively seizing

## 2015-08-22 NOTE — ED Notes (Signed)
Pt is fromgroup  home.  Had seizure at [place].  Pt was seizing still on arrival of EMS.  18g IV in R hand.  Received 2.'5mg'$  Midazolam.  Seizure stopped.  Pt is post ictal on arrival to EMS.  Caregiver states that Pt is typically A&O x4 and ambulatory.  Pt has Hx of severe MR.  Caregiver is on way to ED.  CBG 232  BP 114/84  P 113

## 2015-08-23 ENCOUNTER — Encounter (HOSPITAL_COMMUNITY): Payer: Self-pay | Admitting: *Deleted

## 2015-08-23 DIAGNOSIS — C3492 Malignant neoplasm of unspecified part of left bronchus or lung: Secondary | ICD-10-CM | POA: Diagnosis not present

## 2015-08-23 DIAGNOSIS — R569 Unspecified convulsions: Secondary | ICD-10-CM | POA: Diagnosis not present

## 2015-08-23 DIAGNOSIS — D72829 Elevated white blood cell count, unspecified: Secondary | ICD-10-CM | POA: Diagnosis not present

## 2015-08-23 DIAGNOSIS — G40901 Epilepsy, unspecified, not intractable, with status epilepticus: Secondary | ICD-10-CM | POA: Diagnosis not present

## 2015-08-23 LAB — BASIC METABOLIC PANEL
Anion gap: 9 (ref 5–15)
BUN: 14 mg/dL (ref 6–20)
CHLORIDE: 107 mmol/L (ref 101–111)
CO2: 30 mmol/L (ref 22–32)
CREATININE: 1.03 mg/dL (ref 0.61–1.24)
Calcium: 9.3 mg/dL (ref 8.9–10.3)
GFR calc Af Amer: >60 mL/min (ref >60)
GFR calc non Af Amer: >60 mL/min (ref >60)
GLUCOSE: 105 mg/dL — AB (ref 65–99)
POTASSIUM: 4.3 mmol/L (ref 3.5–5.1)
SODIUM: 146 mmol/L — AB (ref 135–145)

## 2015-08-23 LAB — CBC
HEMATOCRIT: 43.7 % (ref 39.0–52.0)
Hemoglobin: 13.9 g/dL (ref 13.0–17.0)
MCH: 28.8 pg (ref 26.0–34.0)
MCHC: 31.8 g/dL (ref 30.0–36.0)
MCV: 90.5 fL (ref 78.0–100.0)
PLATELETS: 163 10*3/uL (ref 150–400)
RBC: 4.83 MIL/uL (ref 4.22–5.81)
RDW: 14.2 % (ref 11.5–15.5)
WBC: 11 10*3/uL — AB (ref 4.0–10.5)

## 2015-08-23 LAB — MRSA PCR SCREENING: MRSA by PCR: NEGATIVE

## 2015-08-23 MED ORDER — ACETAMINOPHEN 650 MG RE SUPP: 650.0000 mg | Freq: Four times a day (QID) | RECTAL | Status: DC | PRN

## 2015-08-23 MED ORDER — ONDANSETRON HCL 4 MG PO TABS: 4.0000 mg | ORAL_TABLET | Freq: Four times a day (QID) | ORAL | Status: DC | PRN

## 2015-08-23 MED ORDER — ENOXAPARIN SODIUM 40 MG/0.4ML ~~LOC~~ SOLN
40.0000 mg | SUBCUTANEOUS | Status: DC
  Administered 2015-08-23 – 2015-08-25 (×2): 40 mg via SUBCUTANEOUS
  Filled 2015-08-23 (×3): qty 0.4

## 2015-08-23 MED ORDER — SODIUM CHLORIDE 0.9 % IV SOLN
INTRAVENOUS | Status: DC
  Administered 2015-08-23 – 2015-08-25 (×4): via INTRAVENOUS

## 2015-08-23 MED ORDER — ACETAMINOPHEN 325 MG PO TABS: 650.0000 mg | ORAL_TABLET | Freq: Four times a day (QID) | ORAL | Status: DC | PRN

## 2015-08-23 MED ORDER — CETYLPYRIDINIUM CHLORIDE 0.05 % MT LIQD
7.0000 mL | Freq: Two times a day (BID) | OROMUCOSAL | Status: DC
  Administered 2015-08-23 – 2015-08-25 (×6): 7 mL via OROMUCOSAL

## 2015-08-23 MED ORDER — ALUM & MAG HYDROXIDE-SIMETH 200-200-20 MG/5ML PO SUSP: 30.0000 mL | Freq: Four times a day (QID) | ORAL | Status: DC | PRN

## 2015-08-23 MED ORDER — ONDANSETRON HCL 4 MG/2ML IJ SOLN: 4.0000 mg | Freq: Four times a day (QID) | INTRAMUSCULAR | Status: DC | PRN

## 2015-08-23 MED ORDER — SODIUM CHLORIDE 0.9 % IV SOLN: 1500.0000 mg | Freq: Two times a day (BID) | INTRAVENOUS | Status: DC

## 2015-08-23 MED ORDER — ONDANSETRON HCL 4 MG/2ML IJ SOLN
4.0000 mg | Freq: Once | INTRAMUSCULAR | Status: AC
  Administered 2015-08-23: 4 mg via INTRAVENOUS
  Filled 2015-08-23: qty 2

## 2015-08-23 MED ORDER — HYDROMORPHONE HCL 1 MG/ML IJ SOLN: 0.5000 mg | INTRAMUSCULAR | Status: DC | PRN

## 2015-08-23 MED ORDER — SODIUM CHLORIDE 0.9 % IV SOLN
1500.0000 mg | Freq: Two times a day (BID) | INTRAVENOUS | Status: DC
  Administered 2015-08-23 – 2015-08-25 (×5): 1500 mg via INTRAVENOUS
  Filled 2015-08-23: qty 10
  Filled 2015-08-23 (×4): qty 15

## 2015-08-23 MED ORDER — SODIUM CHLORIDE 0.9 % IV SOLN
150.0000 mg | Freq: Two times a day (BID) | INTRAVENOUS | Status: DC
  Administered 2015-08-23 – 2015-08-24 (×3): 150 mg via INTRAVENOUS
  Filled 2015-08-23 (×4): qty 15

## 2015-08-23 MED ORDER — LORAZEPAM 2 MG/ML IJ SOLN: 1.0000 mg | INTRAMUSCULAR | Status: DC | PRN

## 2015-08-23 NOTE — Care Management Note (Signed)
Case Management Note  Patient Details  Name: Marco [name] MRN: [id number] Date of Birth: May 02, 1951  Subjective/Objective:  64 y/o m admitted w/seizures. SH:UOHFGBMS. From group home.CSW following.Restraints.                  Action/Plan:d/c plan return to group home.   Expected Discharge Date:                  Expected Discharge Plan:  Group Home  In-House Referral:  Clinical Social Work  Discharge planning Services  CM Consult  Post Acute Care Choice:    Choice offered to:     DME Arranged:    DME Agency:     HH Arranged:    HH Agency:     Status of Service:  In process, will continue to follow  Medicare Important Message Given:    Date Medicare IM Given:    Medicare IM give by:    Date Additional Medicare IM Given:    Additional Medicare Important Message give by:     If discussed at [place] of Stay Meetings, dates discussed:    Additional Comments:  Dessa Phi, RN 08/23/2015, 2:55 PM

## 2015-08-23 NOTE — Consult Note (Signed)
Neurology Consultation Reason for Consult: Seizures Referring Physician: Rai, R  CC: Seizures  History is obtained from: Chart review  HPI: Marco [name] is a 64 y.o. male with a history of moderate retardation, schizophrenia, who presents with breakthrough seizures.  According to his home medication list he was on 100 mg of Vimpat twice a day as well as 1500 mg of Keppra twice a day. He did not miss any of his doses of medications and presented with breakthrough seizure which aborted with IV Versed en route.   He was still slightly confused following seizures and therefore was admitted for observation. Apparently, his caregiver was by earlier and reported to nursing a seemed like he was pretty much back to his baseline.  ROS: A 14 point ROS was performed and is negative except as noted in the HPI.    Past Medical History  Diagnosis Date  . Mental disorder     sczizophrenia;moderate retardation  . Depression   . DEMENTIA   . Hypertension     no medications, no documented history per caregiver at preadmission  . GERD (gastroesophageal reflux disease)   . Seizures ([place])   . Developmental disability     developmentaly delayed  . Hx of radiation therapy 11/07/13-12/24/13    lung,60Gy/22f  . Mental retardation   . Seizure disorder ([place]   . Lung cancer [place]   . Encounter for antineoplastic immunotherapy 04/05/2015     Family History  Problem Relation Age of Onset  . Hypertension Mother   . Hypertension Father     Social History:  reports that he quit smoking about 1 years ago. His smoking use included Cigarettes. He has a 17.5 pack-year smoking history. He does not have any smokeless tobacco history on file. He reports that he does not drink alcohol or use illicit drugs.  Exam: Current vital signs: BP 121/75 mmHg  Pulse 90  Temp(Src) 98.8 F (37.1 C) (Axillary)  Resp 16  Ht '5\' 9"'$  (1.753 m)  Wt 62 kg (136 lb 11 oz)  BMI 20.18 kg/m2  SpO2 100% Vital signs in last 24  hours: Temp:  [95 F (35 C)-100 F (37.8 C)] 98.8 F (37.1 C) (12/19 0639) Pulse Rate:  [35-113] 90 (12/19 0639) Resp:  [14-20] 16 (12/19 0639) BP: (104-151)/(71-112) 121/75 mmHg (12/19 0639) SpO2:  [85 %-100 %] 100 % (12/19 0639) Weight:  [62 kg (136 lb 11 oz)-62.143 kg (137 lb)] 62 kg (136 lb 11 oz) (12/19 02841  Physical Exam  Constitutional: Appears thin Psych: Affect appropriate to situation Eyes: No scleral injection HENT: poor dentition Head: Normocephalic.  Cardiovascular: Normal rate and regular rhythm.  Respiratory: Effort normal and breath sounds normal to anterior ascultation GI: Soft.  No distension. There is no tenderness.  Skin: WDI  Neuro: Mental Status: Patient is awake, alert, gives "[facility]" as hospital name, will not answer to year. Does follow commands.  Cranial Nerves: II: Visual Fields are full. Pupils are equal, round, and reactive to light.   III,IV, VI: EOMI without ptosis or diploplia.  V: Facial sensation is symmetric to touch VII: Facial movement is symmetric.  VIII: hearing is intact to voice X: Uvula elevates symmetrically XI: Shoulder shrug is symmetric. XII: tongue is midline without atrophy or fasciculations.  Motor: Tone is normal. Bulk is normal. Appears to move all extremities well. He does not comply with formal strength testing, but gives good effort and then gives way immediately. Sensory: He endorses symmetric sensation to light touch Cerebellar:  He does not comply   I have reviewed labs in epic and the results pertinent to this consultation are: CMP-unremarkable  Impression: 64 year old male with known seizure disorder who presents with breakthrough seizure and is now back to baseline. I would favor increasing his vimpat.   Recommendations: 1) Vimpat '150mg'$  BID(increased from home dose of '100mg'$  BID) 2) Keppra '1500mg'$  BID(at home dose) 3) Follow up with outpatient neurology. Please call with further questions or concerns.     Roland Rack, MD [facility] [phone]  If 7pm- 7am, please page neurology on call as listed in [place].

## 2015-08-23 NOTE — Progress Notes (Signed)
Patient came up from ED, very agitated, would not follow commands, combative to care, pulled out his peripheral IV, pulled out the telemetry leads and trying to pull his foley catheter. He had tried to climbed out of bed and unable to put the mittens on because he can pull it out himself. Kathline Magic NP was notified,  I put restraints on all four extremities as ordered. We will continue to monitor patient.

## 2015-08-23 NOTE — Progress Notes (Addendum)
Patient seen and examined, admitted by Dr Arnoldo Morale this morning. Agree with H&P, assessment and plan outlined by admitting physician.   Labs and imagings reviewed by myself.    Assessment & Plan:    Principal Problem:   Seizure ([place]):  - Still postictal, confused, in restraints - In ED, was given IV Keppra loading dose, then placed on IV Keppra and Vimpat - Neurology consulted, EDP discussed with Dr Nicole Kindred, awaiting further recommendations. Patient had required intubation in the past due to status epilepticus. Addendum 12: 20PM Spoke with Dr. Leonel Ramsay, neurology, who will evaluate patient  Active Problems:   Squamous cell lung cancer ([place]) - On chemotherapy, oncologist Dr. [name]  Leukocytosis: Likely due to #1 - Improving, UA negative for UTI, chest x-ray negative for pneumonia  DVT prophylaxis: Lovenox   Maile Linford M.D. [facility] 08/23/2015, 8:18 AM  Pager: [phone]

## 2015-08-24 ENCOUNTER — Ambulatory Visit: Payer: Medicare Other | Admitting: Internal Medicine

## 2015-08-24 ENCOUNTER — Ambulatory Visit: Payer: Medicare Other

## 2015-08-24 ENCOUNTER — Other Ambulatory Visit: Payer: Medicare Other

## 2015-08-24 DIAGNOSIS — R569 Unspecified convulsions: Secondary | ICD-10-CM | POA: Diagnosis not present

## 2015-08-24 DIAGNOSIS — C3492 Malignant neoplasm of unspecified part of left bronchus or lung: Secondary | ICD-10-CM | POA: Diagnosis not present

## 2015-08-24 DIAGNOSIS — G40901 Epilepsy, unspecified, not intractable, with status epilepticus: Secondary | ICD-10-CM | POA: Diagnosis not present

## 2015-08-24 MED ORDER — LORAZEPAM 2 MG/ML IJ SOLN
1.0000 mg | INTRAMUSCULAR | Status: DC | PRN
  Administered 2015-08-24 – 2015-08-25 (×2): 1 mg via INTRAVENOUS
  Filled 2015-08-24: qty 1

## 2015-08-24 MED ORDER — LORAZEPAM 2 MG/ML IJ SOLN
INTRAMUSCULAR | Status: AC
  Filled 2015-08-24: qty 1

## 2015-08-24 NOTE — Progress Notes (Signed)
Patient was interfering with the medical equipment and was trying to jump out of bed. New orders were placed for Non-Violent restraints.

## 2015-08-24 NOTE — Progress Notes (Signed)
[facility]                                                                              Patient Demographics  Marco [name], is a 64 y.o. male, DOB - August 31, 1951, BSW:[id number]  Admit date - 08/22/2015   Admitting Physician Theressa Millard, MD  Outpatient Primary MD for the patient is [name],[name], MD  LOS - 2   Chief Complaint  Patient presents with  . Seizures       Brief HPI   Marco [name] is a 64 y.o. male with a history of Seizures, Lung Cancer LULS/P Radiation Rx on Chemo Rx, Schizophenia, and Cognitive Deficit who was brought to the ED due to [facility]izures [facility]. His caretaker provided the history, and patient has not missed any of his medications and is on Keppra and Vimpat Rx. Patient was administered IV Versed on route to the ED, and in the ED he was loaded with [name], and administerd IV Vimpat and Neurology was consulted. He was referred for admission.    Assessment & Plan   Principal Problem:  Seizure [facility]): With underlying history of cognitive deficits, schizophrenia - In ED, was given IV Keppra loading dose, then placed on IV Keppra and Vimpat - Neurology consulted, seen by Dr. Leonel Ramsay, increased Vimpat, Keppra  - No repeat seizures after hospitalization  Active Problems:  Squamous cell lung cancer ([place]) - On chemotherapy, oncologist Dr. [name]  Leukocytosis: Likely due to #1 - Improving, UA negative for UTI, chest x-ray negative for pneumonia  DVT prophylaxis: Lovenox  Code Status: Full code  Family Communication: no family member in the room  Disposition Plan: Hopefully DC 24-48 hrs if stable and off restraints  Time Spent in minutes   15 minutes  Procedures  None   Consults   neurology  DVT Prophylaxis  Lovenox   Medications  Scheduled Meds: . antiseptic oral rinse  7 mL Mouth Rinse BID  . enoxaparin (LOVENOX) injection  40 mg Subcutaneous Q24H  . lacosamide (VIMPAT) IV   150 mg Intravenous Q12H  . levETIRAcetam  1,500 mg Intravenous Q12H  . LORazepam       Continuous Infusions: . sodium chloride 75 mL/hr at 08/24/15 0525   PRN Meds:.acetaminophen **OR** acetaminophen, alum & mag hydroxide-simeth, HYDROmorphone (DILAUDID) injection, LORazepam, ondansetron **OR** ondansetron (ZOFRAN) IV   Antibiotics   Anti-infectives    None        Subjective:   Marco [name] was seen and examined today.  Somewhat difficult to understand however mental status is improving, has a history of cognitive deficits. Patient denies dizziness, chest pain, shortness of breath, abdominal pain, N/V/D/C, new weakness, numbess, tingling. No acute events overnight or repeat seizures.    Objective:   Blood pressure 128/86, pulse 71, temperature 98 F (36.7 C), temperature source Oral, resp. rate 18, height '5\' 9"'$  (1.753 m), weight 62 kg (136 lb 11 oz), SpO2 100 %.  Wt Readings from Last 3 Encounters:  08/23/15 62 kg (136 lb 11 oz)  07/27/15 64.547 kg (142 lb 4.8 oz)  07/13/15 62.279 kg (137 lb 4.8 oz)  Intake/Output Summary (Last 24 hours) at 08/24/15 1221 Last data filed at 08/24/15 1212  Gross per 24 hour  Intake 2152.5 ml  Output   2250 ml  Net  -97.5 ml    Exam  General: Alert and oriented, NAD, cognitive deficits  HEENT:  PERRLA, EOMI, Anicteric Sclera, mucous membranes moist.   Neck: Supple, no JVD, no masses  CVS: S1 S2 auscultated, no rubs, murmurs or gallops. Regular rate and rhythm.  Respiratory: Clear to auscultation bilaterally, no wheezing, rales or rhonchi  Abdomen: Soft, nontender, nondistended, + bowel sounds  Ext: no cyanosis clubbing or edema  Neuro: in restraints  Skin: No rashes  Psych: Normal affect and demeanor, alert and oriented to self and place   Data Review   Micro Results Recent Results (from the past 240 hour(s))  MRSA PCR Screening     Status: None   Collection Time: 08/23/15  2:45 AM  Result Value Ref Range  Status   MRSA by PCR NEGATIVE NEGATIVE Final    Comment:        The GeneXpert MRSA Assay (FDA approved for NASAL specimens only), is one component of a comprehensive MRSA colonization surveillance program. It is not intended to diagnose MRSA infection nor to guide or monitor treatment for MRSA infections.     Radiology Reports Ct Chest W Contrast  07/26/2015  CLINICAL DATA:  Left left non-small-cell lung cancer, diagnosed 12/2013, chemotherapy and XRT complete EXAM: CT CHEST WITH CONTRAST TECHNIQUE: Multidetector CT imaging of the chest was performed during intravenous contrast administration. CONTRAST:  53m OMNIPAQUE IOHEXOL 300 MG/ML  SOLN COMPARISON:  04/21/2015 FINDINGS: Mediastinum/Nodes: The heart is normal in size. Small pericardial effusion. Mild atherosclerotic calcifications of the aortic arch. No suspicious mediastinal, hilar, or axillary lymphadenopathy. Visualized thyroid is unremarkable. Lungs/Pleura: 5.8 x 3.2 cm medial left upper lobe mass (series 2/image 20), previously 5.8 x 3.7 cm, likely corresponding to known lung cancer. Surrounding radiation changes in the left perihilar/ paramediastinal region, now more coalescent with the dominant mass. Associated volume loss in the left hemithorax. Multifocal patchy/nodular opacities in the right lung, suspicious for multifocal pneumonia. Previously this appearance was isolated to the dependent right upper and lower lobes. 4 mm nodule in the right lower lobe along the major fissure (series 5/image 25), unchanged. Underlying mild centrilobular and paraseptal emphysematous changes. Prior left pleural effusion has resolved.  No pneumothorax. Upper abdomen: Visualized upper abdomen is notable for the 2.0 cm posterior right upper pole renal cyst (series 2/ image 61). Musculoskeletal: Mild degenerative changes of the visualized thoracolumbar spine. IMPRESSION: 5.8 x 3.2 cm medial left upper lobe mass, mildly decreased, likely corresponding to  known primary bronchogenic neoplasm. Surrounding radiation changes with volume loss in the left hemithorax. Multifocal patchy/nodular opacities in the right lung, suspicious for multifocal pneumonia. Electronically Signed   By: SJulian HyM.D.   On: 07/26/2015 11:16   Dg Chest Port 1 View  08/22/2015  CLINICAL DATA:  History of lung cancer.  Seizure.  Postictal. EXAM: PORTABLE CHEST 1 VIEW COMPARISON:  07/26/2015. FINDINGS: Heart size is mildly enlarged. There is no pleural effusion or edema. Left upper lobe perihilar lung mass is again identified better seen on CT from 07/26/2015. No acute airspace opacification. IMPRESSION: No acute cardiopulmonary abnormalities. Electronically Signed   By: TKerby MoorsM.D.   On: 08/22/2015 20:38    CBC  Recent Labs Lab 08/22/15 2157 08/23/15 0430  WBC 12.7* 11.0*  HGB 14.3 13.9  HCT 44.6 43.7  PLT 164 163  MCV 89.7 90.5  MCH 28.8 28.8  MCHC 32.1 31.8  RDW 14.2 14.2  LYMPHSABS 0.7  --   MONOABS 0.5  --   EOSABS 0.0  --   BASOSABS 0.0  --     Chemistries   Recent Labs Lab 08/22/15 2157 08/23/15 0430  NA 143 146*  K 4.0 4.3  CL 105 107  CO2 32 30  GLUCOSE 92 105*  BUN 13 14  CREATININE 1.08 1.03  CALCIUM 9.3 9.3  AST 12*  --   ALT 10*  --   ALKPHOS 66  --   BILITOT 0.6  --    ------------------------------------------------------------------------------------------------------------------ estimated creatinine clearance is 61.9 mL/min (by C-G formula based on Cr of 1.03). ------------------------------------------------------------------------------------------------------------------ No results for input(s): HGBA1C in the last 72 hours. ------------------------------------------------------------------------------------------------------------------ No results for input(s): CHOL, HDL, LDLCALC, TRIG, CHOLHDL, LDLDIRECT in the last 72  hours. ------------------------------------------------------------------------------------------------------------------ No results for input(s): TSH, T4TOTAL, T3FREE, THYROIDAB in the last 72 hours.  Invalid input(s): FREET3 ------------------------------------------------------------------------------------------------------------------ No results for input(s): VITAMINB12, FOLATE, FERRITIN, TIBC, IRON, RETICCTPCT in the last 72 hours.  Coagulation profile No results for input(s): INR, PROTIME in the last 168 hours.  No results for input(s): DDIMER in the last 72 hours.  Cardiac Enzymes No results for input(s): CKMB, TROPONINI, MYOGLOBIN in the last 168 hours.  Invalid input(s): CK ------------------------------------------------------------------------------------------------------------------ Invalid input(s): POCBNP   Recent Labs  08/22/15 2007  GLUCAP 176*     RAI,RIPUDEEP M.D. [facility] 08/24/2015, 12:21 PM  Pager: [phone] Between 7am to 7pm - call Pager - [phone]  After 7pm go to [email or web address] - password [email or web address]  Call night coverage person covering after 7pm

## 2015-08-25 DIAGNOSIS — C3490 Malignant neoplasm of unspecified part of unspecified bronchus or lung: Secondary | ICD-10-CM | POA: Diagnosis not present

## 2015-08-25 DIAGNOSIS — R569 Unspecified convulsions: Secondary | ICD-10-CM

## 2015-08-25 DIAGNOSIS — G40901 Epilepsy, unspecified, not intractable, with status epilepticus: Secondary | ICD-10-CM | POA: Diagnosis not present

## 2015-08-25 MED ORDER — LACOSAMIDE 100 MG PO TABS: 150.0000 mg | ORAL_TABLET | Freq: Two times a day (BID) | ORAL | Status: DC

## 2015-08-25 MED ORDER — RISPERIDONE 1 MG PO TABS: 1.0000 mg | ORAL_TABLET | Freq: Every day | ORAL | Status: DC

## 2015-08-25 MED ORDER — RESOURCE THICKENUP CLEAR PO POWD
ORAL | Status: DC | PRN
  Filled 2015-08-25: qty 125

## 2015-08-25 MED ORDER — LEVETIRACETAM 750 MG PO TABS: 1500.0000 mg | ORAL_TABLET | Freq: Two times a day (BID) | ORAL | Status: DC

## 2015-08-25 NOTE — Care Management Important Message (Signed)
Important Message  Patient Details  Name: QUARON DELACRUZ MRN: [id number] Date of Birth: 1951-05-19   Medicare Important Message Given:  Yes    Camillo Flaming 08/25/2015, 11:15 AMImportant Message  Patient Details  Name: RAYSEAN GRAUMANN MRN: [id number] Date of Birth: 1951-08-15   Medicare Important Message Given:  Yes    Camillo Flaming 08/25/2015, 11:14 AM

## 2015-08-25 NOTE — Evaluation (Signed)
Physical Therapy Evaluation Patient Details Name: Marco [name] MRN: [id number] DOB: [birth year] Today's Date: 08/25/2015   History of Present Illness  64 yo male admitted with Sz, . Hx of developmental disability, Sz d/o, dementia, HTN, lung cancer. Pt is from a group home.  Clinical Impression  Patient  Is sluggish, able to participate in evaluation. Caregivers from group home report that they are able to care for patient  And will provide close 24/7  Supervision and assistance until patient returns to baseline.   Follow Up Recommendations Home health PT;Supervision/Assistance - 24 hour    Equipment Recommendations  None recommended by PT    Recommendations for Other Services       Precautions / Restrictions Precautions Precautions: Fall Precaution Comments: seizure      Mobility  Bed Mobility Overal bed mobility: Needs Assistance Bed Mobility: Supine to Sit     Supine to sit: Supervision        Transfers Overall transfer level: Needs assistance Equipment used: 1 person hand held assist Transfers: Sit to/from Stand Sit to Stand: Min assist         General transfer comment: steady assistance  to stand from bed and recliner, wide base, swaying,   Ambulation/Gait Ambulation/Gait assistance: Min assist Ambulation Distance (Feet): 125 Feet Assistive device: 1 person hand held assist Gait Pattern/deviations: Step-to pattern;Step-through pattern;Trunk flexed;Staggering left;Staggering right   Gait velocity interpretation: Below normal speed for age/gender General Gait Details: staggring gait, steady assistance, IMPROVED  AS HE AMBULATED FARTHER.  Stairs Stairs: Yes Stairs assistance: Min assist Stair Management: Two rails;Step to pattern Number of Stairs: 5 General stair comments: caregivers present to assist  Wheelchair Mobility    Modified Rankin (Stroke Patients Only)       Balance Overall balance assessment: Needs assistance Sitting-balance  support: Feet supported;Bilateral upper extremity supported Sitting balance-Leahy Scale: Fair     Standing balance support: During functional activity;Single extremity supported Standing balance-Leahy Scale: Poor                               Pertinent Vitals/Pain Pain Assessment: Faces Faces Pain Scale: No hurt    Home Living Family/patient expects to be discharged to:: Group home                 Additional Comments: previous notes indicate caregivers present at group home, pt has to climb a flight of stairs    Prior Function Level of Independence: Needs assistance   Gait / Transfers Assistance Needed: independent     Comments: patient had supervision/ assist for higher cognitive tasks, but was able to mobilize and perform various aspects of self care independently     Hand Dominance   Dominant Hand: Right    Extremity/Trunk Assessment   Upper Extremity Assessment: Generalized weakness           Lower Extremity Assessment: Generalized weakness      Cervical / Trunk Assessment: Kyphotic  Communication   Communication: Expressive difficulties  Cognition Arousal/Alertness: Lethargic;Suspect due to medications Behavior During Therapy: Flat affect Overall Cognitive Status: History of cognitive impairments - at baseline                 General Comments: caregivers report that patient  is more sluggish than normal, probably due to medications    General Comments      Exercises        Assessment/Plan    PT Assessment  PT Diagnosis Generalized weakness;Altered mental status   PT Problem List    PT Treatment Interventions     PT Goals (Current goals can be found in the Care Plan section) Acute Rehab PT Goals Patient Stated Goal: to go home per caregivers PT Goal Formulation: All assessment and education complete, DC therapy (caregivers)    Frequency     Barriers to discharge        Co-evaluation                End of Session Equipment Utilized During Treatment: Gait belt Activity Tolerance: Patient tolerated treatment well Patient left: in chair;with nursing/sitter in room;with family/visitor present Nurse Communication: Mobility status         Time: 7125-2712 PT Time Calculation (min) (ACUTE ONLY): 18 min   Charges:   PT Evaluation $Initial PT Evaluation Tier I: 1 Procedure     PT G CodesClaretha Cooper 08/25/2015, 12:01 PM

## 2015-08-25 NOTE — Care Management Note (Signed)
Case Management Note  Patient Details  Name: Marco [name] MRN: [id number] Date of Birth: 27-Oct-1950  Subjective/Objective:     [name] chosen for HHPT.TC [employer] rep Tim aware of HHPT, & d/c.               Action/Plan:return back to Group home w/HHPT.   Expected Discharge Date:                  Expected Discharge Plan:  Group Home  In-House Referral:  Clinical Social Work  Discharge planning Services  CM Consult  Post Acute Care Choice:    Choice offered to:  Patient  DME Arranged:    DME Agency:     HH Arranged:  PT HH Agency:  [facility]  Status of Service:  Completed, signed off  Medicare Important Message Given:  Yes Date Medicare IM Given:    Medicare IM give by:    Date Additional Medicare IM Given:    Additional Medicare Important Message give by:     If discussed at [place] of Stay Meetings, dates discussed:    Additional Comments:  Dessa Phi, RN 08/25/2015, 12:21 PM

## 2015-08-25 NOTE — Discharge Summary (Signed)
Physician Discharge Summary  Marco [name] YTK:[id number] DOB: 03-27-1951 DOA: 08/22/2015  PCP: Marco [name]  Admit date: 08/22/2015 Discharge date: 08/25/2015  Recommendations for Outpatient Follow-up:  1. Pt will need to follow up with PCP in 1 week post discharge 2. Please obtain BMP to evaluate electrolytes and kidney function 3. Please note that dose of Vimpat was changed from 100 mg BID to 150 mg BID 4. Also please note the dose of Keppra to remain at1500 mg PO BID 5. These medication doses changes were recommended by our neurology team 6. Please also note that recommendation was made to follow up with neurology team in an outpatient setting 7. Please also note that dose of Risperdal was lower from 3 mg to 1 mg QHS and if needed this can be readjusted 8. Also assess need for Ativan which we have avoided here to prevent sedation   Discharge Diagnoses:  Principal Problem:   Seizure ([place]) Active Problems:   Squamous cell lung cancer ([place])  Discharge Condition: Stable  Diet recommendation: Dys I diet nectar thick   History of present illness:  64 y.o. male with a history of moderate retardation, schizophrenia, who presented with breakthrough seizures. According to home medication list he was on 100 mg of Vimpat twice a day as well as 1500 mg of Keppra twice a day. He did not miss any of his doses of medications and presented with breakthrough seizure which aborted with IV Versed en route.   Hospital Course:  Principal Problem:   Seizure [facility]) - seizure free since admission and alert this AM - continue with Vimpat and Keppra, dosing details outlined below  - recommendation is to have PCP or neurologist assess need for Ativan   Active Problems:   Squamous cell lung cancer ([place]) - outpatient follow up     Leukocytosis - likely reactive from seizure - improving    Hypernatremia - from dehydration - IVF have been provided  - needs outpatient follow  up  Procedures/Studies: Dg Chest [facility] 08/22/2015  No acute cardiopulmonary abnormalities.   Discharge Exam: Filed Vitals:   08/25/15 0541 08/25/15 0741  BP: 126/84 123/81  Pulse: 97 95  Temp: 97.9 F (36.6 C) 97.7 F (36.5 C)  Resp: 18 18   Filed Vitals:   08/24/15 2026 08/25/15 0234 08/25/15 0541 08/25/15 0741  BP: 130/81 127/81 126/84 123/81  Pulse: 87 99 97 95  Temp: 97.8 F (36.6 C) 98.6 F (37 C) 97.9 F (36.6 C) 97.7 F (36.5 C)  TempSrc: Axillary Axillary Oral Oral  Resp: '18 18 18 18  '$ Height:      Weight:      SpO2: 99% 100% 100% 100%    General: Pt is alert, not in acute distress Cardiovascular: Regular rate and rhythm, S1/S2 +, no murmurs, no rubs, no gallops Respiratory: Clear to auscultation bilaterally, no wheezing, diminished breath sounds at bases   Abdominal: Soft, non tender, non distended, bowel sounds +, no guarding  Discharge Instructions  Discharge Instructions    Diet - low sodium heart healthy    Complete by:  As directed      Increase activity slowly    Complete by:  As directed             Medication List    STOP taking these medications        HYDROcodone-homatropine 5-1.5 MG/5ML syrup  Commonly known as:  HYCODAN     LORazepam 1 MG tablet  Commonly known  as:  ATIVAN      TAKE these medications        bisacodyl 5 MG EC tablet  Commonly known as:  DULCOLAX  Take 5 mg by mouth daily as needed for moderate constipation (constipation).     donepezil 10 MG tablet  Commonly known as:  ARICEPT  Take 10 mg by mouth at bedtime.     fluticasone 50 MCG/ACT nasal spray  Commonly known as:  FLONASE  Place 2 sprays into both nostrils daily.     Lacosamide 100 MG Tabs  Take 1.5 tablets (150 mg total) by mouth 2 (two) times daily.     levETIRAcetam 750 MG tablet  Commonly known as:  KEPPRA  Take 2 tablets (1,500 mg total) by mouth 2 (two) times daily.     omeprazole 20 MG capsule  Commonly known as:  PRILOSEC  Take 20  mg by mouth 2 (two) times daily.     prochlorperazine 10 MG tablet  Commonly known as:  COMPAZINE  Take 10 mg by mouth every 6 (six) hours as needed for nausea or vomiting.     RESOURCE THICKENUP CLEAR Powd  Take 120 g by mouth as needed.     risperiDONE 1 MG tablet  Commonly known as:  RISPERDAL  Take 1 tablet (1 mg total) by mouth at bedtime.     traZODone 50 MG tablet  Commonly known as:  DESYREL  Take 0.5 tablets (25 mg total) by mouth at bedtime.           Follow-up Information    Follow up with Marco [name], [name].   Specialty:  Family Medicine   Contact information:   [place] [address] [phone]        The results of significant diagnostics from this hospitalization (including imaging, microbiology, ancillary and laboratory) are listed below for reference.     Microbiology: Recent Results (from the past 240 hour(s))  MRSA PCR Screening     Status: None   Collection Time: 08/23/15  2:45 AM  Result Value Ref Range Status   MRSA by PCR NEGATIVE NEGATIVE Final     Labs: Basic Metabolic Panel:  Recent Labs Lab 08/22/15 2157 08/23/15 0430  NA 143 146*  K 4.0 4.3  CL 105 107  CO2 32 30  GLUCOSE 92 105*  BUN 13 14  CREATININE 1.08 1.03  CALCIUM 9.3 9.3   Liver Function Tests:  Recent Labs Lab 08/22/15 2157  AST 12*  ALT 10*  ALKPHOS 66  BILITOT 0.6  PROT 7.8  ALBUMIN 4.6   CBC:  Recent Labs Lab 08/22/15 2157 08/23/15 0430  WBC 12.7* 11.0*  NEUTROABS 11.5*  --   HGB 14.3 13.9  HCT 44.6 43.7  MCV 89.7 90.5  PLT 164 163    CBG:  Recent Labs Lab 08/22/15 2007  GLUCAP 176*   SIGNED: Time coordinating discharge: 30 minutes  Marco Ramsay, [name]  [facility] 08/25/2015, 9:50 AM Pager [phone]  If 7PM-7AM, please contact night-coverage [email or web address] Password [email or web address]

## 2015-08-25 NOTE — Discharge Instructions (Signed)
Seizure, Adult A seizure means there is unusual activity in the brain. A seizure can cause changes in attention or behavior. Seizures often cause shaking (convulsions). Seizures often last from 30 seconds to 2 minutes. HOME CARE   If you are given medicines, take them exactly as told by your doctor.  Keep all doctor visits as told.  Do not swim or drive until your doctor says it is okay.  Teach others what to do if you have a seizure. They should:  Lay you on the ground.  Put a cushion under your head.  Loosen any tight clothing around your neck.  Turn you on your side.  Stay with you until you get better. GET HELP RIGHT AWAY IF:   The seizure lasts longer than 2 to 5 minutes.  The seizure is very bad.  The person does not wake up after the seizure.  The person's attention or behavior changes. Drive the person to the emergency room or call your local emergency services (911 in [place] MAKE SURE YOU:   Understand these instructions.  Will watch your condition.  Will get help right away if you are not doing well or get worse.   This information is not intended to replace advice given to you by your health care provider. Make sure you discuss any questions you have with your health care provider.   Document Released: 02/07/2008 Document Revised: 11/13/2011 Document Reviewed: 04/02/2013 Elsevier Interactive Patient Education [employer].

## 2015-08-25 NOTE — Evaluation (Signed)
Clinical/Bedside Swallow Evaluation Patient Details  Name: Marco [name] MRN: [id number] Date of Birth: 27-Aug-1951  Today's Date: 08/25/2015 Time: SLP Start Time (ACUTE ONLY): 0822 SLP Stop Time (ACUTE ONLY): 0845 SLP Time Calculation (min) (ACUTE ONLY): 23 min  Past Medical History:  Past Medical History  Diagnosis Date  . Mental disorder     sczizophrenia;moderate retardation  . Depression   . DEMENTIA   . Hypertension     no medications, no documented history per caregiver at preadmission  . GERD (gastroesophageal reflux disease)   . Seizures ([place])   . Developmental disability     developmentaly delayed  . Hx of radiation therapy 11/07/13-12/24/13    lung,60Gy/53f  . Mental retardation   . [place]   . [place]   . Encounter for antineoplastic immunotherapy 04/05/2015   Past Surgical History:  Past Surgical History  Procedure Laterality Date  . Cataract extraction w/phaco  07/12/2011    Procedure: CATARACT EXTRACTION PHACO AND INTRAOCULAR LENS PLACEMENT (IOC);  Surgeon: GAdonis Brook  [facility];  Service: Ophthalmology;  Laterality: Left;  .Marland KitchenEye surgery      L eye  . Video bronchoscopy Bilateral 10/01/2013    Procedure: VIDEO BRONCHOSCOPY WITHOUT FLUORO;  Surgeon: RCollene Gobble MD;  [facility]  Service: Cardiopulmonary;  Laterality: Bilateral;   HPI:  64y.o. male with a history of Seizures, Lung Cancer LULS/P Radiation Rx on Chemo Rx, Schizophenia, and Cognitive disability brought to the ED due to Seizures at his group home.CXR no acute cardiopulmonary abnormalities. MBS 07/02/15 silent penetration with eventual aspiration of thin; poor mastication, impulsive, large sips. Dys 1, nectar thick liquids recommended. CT chest 07/2015 multifocal patchy/nodular opacities in the right lung, suspicious for multifocal pneumonia.   Assessment / Plan / Recommendation Clinical Impression  Significant indications of aspiration with puree and  thin liquids demonstrated by immediate throat clears and delayed coughs. Audible swallow can indicate abnormality in structure and/or function, multiple swallows (6 with one sips juice) due to likely pharyngeal residue. Pt required tactile assist for self feeding and max cues to decrease rate. Little to no mastication of chopped eggs prior to initiating swallow. Pt with history of aspiration 06/2015 and required nectar thick liquids. Presently he is at high aspiration risk. CXR clear on admission. Difficult to determine if current presentation is baseline or decline in function. Downgraded liquids to nectar consistency, continue puree, crush meds, must have FULL supervision with meals. ST will continue to follow.      Aspiration Risk  Severe aspiration risk    Diet Recommendation Dysphagia 1 (Puree);Nectar-thick liquid   Liquid Administration via: Cup;No straw Medication Administration: Crushed with puree Supervision: Staff to assist with self feeding;Full supervision/cueing for compensatory strategies (allow pt to hold utensil, cup) Compensations: Slow rate;Small sips/bites;Lingual sweep for clearance of pocketing;Clear throat intermittently Postural Changes: Seated upright at 90 degrees;Remain upright for at least 30 minutes after po intake    Other  Recommendations Oral Care Recommendations: Oral care BID Other Recommendations: Order thickener from pharmacy   Follow up Recommendations   (tbd)    Frequency and Duration min 2x/week  2 weeks       Prognosis Prognosis for Safe Diet Advancement: Fair Barriers to Reach Goals: Cognitive deficits;Behavior      Swallow Study   General HPI: 64y.o. male with a history of Seizures, Lung Cancer LULS/P Radiation Rx on Chemo Rx, Schizophenia, and Cognitive disability brought to the ED due to Seizures  at his group home.CXR no acute cardiopulmonary abnormalities. MBS 07/02/15 silent penetration with eventual aspiration of thin; poor mastication,  impulsive, large sips. Dys 1, nectar thick liquids recommended. CT chest 07/2015 multifocal patchy/nodular opacities in the right lung, suspicious for multifocal pneumonia. Type of Study: Bedside Swallow Evaluation Previous Swallow Assessment:  (see HPI) Diet Prior to this Study: Dysphagia 1 (puree);Thin liquids Temperature Spikes Noted: No Respiratory Status: Room air History of Recent Intubation: No Behavior/Cognition: Alert;Cooperative;Pleasant mood;Impulsive;Requires cueing Oral Cavity Assessment: Dry Oral Care Completed by SLP: Yes Oral Cavity - Dentition:  (2 lower teeth total) Vision: Functional for self-feeding Self-Feeding Abilities: Needs assist;Needs set up Patient Positioning: Upright in bed Baseline Vocal Quality: Normal Volitional Cough: Strong Volitional Swallow: Able to elicit    Oral/Motor/Sensory Function Overall Oral Motor/Sensory Function:  (no focal deficits)   Ice Chips Ice chips: Not tested   Thin Liquid Thin Liquid: Impaired Presentation: Cup Oral Phase Impairments: Reduced labial seal;Reduced lingual movement/coordination Oral Phase Functional Implications: Right anterior spillage Pharyngeal  Phase Impairments: Suspected delayed Swallow;Multiple swallows;Throat Clearing - Immediate;Cough - Delayed (audible swallows)    Nectar Thick Nectar Thick Liquid: Not tested   Honey Thick Honey Thick Liquid: Not tested   Puree Puree: Impaired Oral Phase Impairments: Reduced labial seal;Reduced lingual movement/coordination;Impaired mastication Pharyngeal Phase Impairments: Suspected delayed Swallow;Cough - Delayed;Throat Clearing - Immediate;Multiple swallows (audible swallows)   Solid Solid: Not tested       [name] 08/25/2015,9:01 AM  [name] [name].Ed [employer] [phone]

## 2015-08-30 NOTE — Progress Notes (Signed)
Late SLP entry    08/25/15 0757  SLP G-Codes **NOT FOR INPATIENT CLASS**  Functional Assessment Tool Used skilled clinical judgement  Functional Limitations Swallowing  Swallow Current Status (P2244) CK  Swallow Goal Status (L7530) CJ  Swallow Discharge Status (Y5110) CJ  SLP Evaluations  $ SLP Speech Visit 1 Procedure  SLP Evaluations  $BSS Swallow 1 Procedure    Marco [name] [name] M.Ed [employer] [phone]

## 2015-08-30 NOTE — Progress Notes (Signed)
   08/25/15 1201  PT G-Codes **NOT FOR INPATIENT CLASS**  Functional Assessment Tool Used clinical judgement per eval documented by Tresa Endo   Functional Limitation Mobility: Walking and moving around  Mobility: Walking and Moving Around Current Status [phone]) CI  Mobility: Walking and Moving Around Goal Status [phone]) CI  Mobility: Walking and Moving Around Discharge Status [phone]) CI  per chart from [facility], PT Clide Dales, PT Pager: [phone] 08/30/2015

## 2015-09-13 ENCOUNTER — Ambulatory Visit (HOSPITAL_COMMUNITY): Payer: Medicare Other

## 2015-09-17 ENCOUNTER — Ambulatory Visit (HOSPITAL_COMMUNITY)
Admission: RE | Admit: 2015-09-17 | Discharge: 2015-09-17 | Disposition: A | Discharge status: Home / Self Care | Payer: Medicare Other | Source: Ambulatory Visit | Attending: Internal Medicine | Admitting: Internal Medicine

## 2015-09-17 DIAGNOSIS — C3492 Malignant neoplasm of unspecified part of left bronchus or lung: Secondary | ICD-10-CM | POA: Insufficient documentation

## 2015-09-17 DIAGNOSIS — K229 Disease of esophagus, unspecified: Secondary | ICD-10-CM | POA: Insufficient documentation

## 2015-09-17 DIAGNOSIS — R911 Solitary pulmonary nodule: Secondary | ICD-10-CM | POA: Diagnosis not present

## 2015-09-17 DIAGNOSIS — R5382 Chronic fatigue, unspecified: Secondary | ICD-10-CM | POA: Insufficient documentation

## 2015-09-17 DIAGNOSIS — Z5112 Encounter for antineoplastic immunotherapy: Secondary | ICD-10-CM

## 2015-09-17 DIAGNOSIS — Z9221 Personal history of antineoplastic chemotherapy: Secondary | ICD-10-CM | POA: Insufficient documentation

## 2015-09-17 DIAGNOSIS — Z923 Personal history of irradiation: Secondary | ICD-10-CM | POA: Diagnosis not present

## 2015-09-17 MED ORDER — IOHEXOL 300 MG/ML  SOLN
75.0000 mL | Freq: Once | INTRAMUSCULAR | Status: AC | PRN
  Administered 2015-09-17: 75 mL via INTRAVENOUS

## 2015-09-24 ENCOUNTER — Telehealth: Payer: Self-pay | Admitting: Medical Oncology

## 2015-09-24 ENCOUNTER — Other Ambulatory Visit: Payer: Self-pay | Admitting: Medical Oncology

## 2015-09-24 DIAGNOSIS — C3490 Malignant neoplasm of unspecified part of unspecified bronchus or lung: Secondary | ICD-10-CM

## 2015-09-24 MED ORDER — HYDROCODONE-HOMATROPINE 5-1.5 MG/5ML PO SYRP: 5.0000 mL | ORAL_SOLUTION | Freq: Four times a day (QID) | ORAL | Status: DC | PRN

## 2015-09-24 NOTE — Telephone Encounter (Signed)
Marco [name] with [facility] called on behalf of this client requesting Hycodan 5 mg syrup refill. He called stating he just took the last dose.  Call transferred to collaborative nurse [phone].

## 2015-09-24 NOTE — Telephone Encounter (Signed)
I called [facility] and last refill was 07/27/15. Rx refilled and locked up for pickup and message left on Wallace's phone to pick it up before 430 today with picture ID.

## 2015-09-27 ENCOUNTER — Other Ambulatory Visit: Payer: Self-pay | Admitting: Neurology

## 2015-09-27 NOTE — Telephone Encounter (Signed)
Marco [name] with [facility] called inquiring about dose for Vimpat.  '100mg'$  tablet is current dose 2x.day or '150mg'$  2 x day? She said dose looks like it has changed since 07/15/15 to '100mg'$  2 x day. Caregiver is saying it should be '150mg'$  2 x day. She said it was changed after pt went into hospital but she doesn't have RX to reflect that.

## 2015-09-27 NOTE — Telephone Encounter (Signed)
Patient does was changed to '150mg'$  BID at ED visit.  Requesting new Rx to reflect this change.  Thank you.

## 2015-09-27 NOTE — Telephone Encounter (Signed)
It is '150mg'$  twice daily. Would you send in the prescription please? thanks

## 2015-09-28 MED ORDER — LACOSAMIDE 100 MG PO TABS: 150.0000 mg | ORAL_TABLET | Freq: Two times a day (BID) | ORAL | Status: DC

## 2015-09-28 NOTE — Telephone Encounter (Signed)
I called back and spoke with [facility].  They will dispense Rx for '150mg'$  twice daily, and will call back if anything further is needed.

## 2015-09-29 ENCOUNTER — Ambulatory Visit (INDEPENDENT_AMBULATORY_CARE_PROVIDER_SITE_OTHER): Payer: Medicare Other | Admitting: Neurology

## 2015-09-29 ENCOUNTER — Encounter: Payer: Self-pay | Admitting: Neurology

## 2015-09-29 ENCOUNTER — Ambulatory Visit: Payer: Medicare Other | Admitting: Neurology

## 2015-09-29 VITALS — BP 135/82 | HR 97 | Ht 69.0 in | Wt 138.2 lb

## 2015-09-29 DIAGNOSIS — R569 Unspecified convulsions: Secondary | ICD-10-CM

## 2015-09-29 DIAGNOSIS — G40919 Epilepsy, unspecified, intractable, without status epilepticus: Secondary | ICD-10-CM

## 2015-09-29 MED ORDER — LACOSAMIDE 200 MG PO TABS: 200.0000 mg | ORAL_TABLET | Freq: Two times a day (BID) | ORAL | Status: DC

## 2015-09-29 MED ORDER — DIAZEPAM 10 MG RE GEL: 5.0000 mg | Freq: Once | RECTAL | Status: DC

## 2015-09-29 NOTE — Progress Notes (Signed)
   07/02/15 1300  SLP G-Codes **NOT FOR INPATIENT CLASS**  Functional Assessment Tool Used clinical judgement  Functional Limitations Swallowing  Swallow Current Status (K4401) CL  Swallow Goal Status (U2725) CK  SLP Evaluations  $ SLP Speech Visit 1 Procedure  SLP Evaluations  $MBS Swallow 1 Procedure  $Swallowing Treatment 1 Procedure

## 2015-09-29 NOTE — Progress Notes (Signed)
[facility]    Provider:  Dr Marco [name] Referring Provider: Leamon Arnt, MD Primary Care Physician:  Marco Arnt, MD  CC: Seizures  Per notes, 65 y.o. male with a history of moderate retardation, schizophrenia, who presented with breakthrough seizures in December 2016. He was on 100 mg of Vimpat twice a day as well as 1500 mg of Keppra twice a day. He did not miss any of his doses of medications and presented with breakthrough seizure which aborted with IV Versed en route. He had a cold at Marco time but no fever. He takes Lorazepam '2mg'$  a night at bedtime and this is chronic. His brother is here with him who provides most information. He had another breakthrough seizure in his bedroom in Marco evening. He never misses his medications. It was GTCs. They called EMS and he was hospitalized.   Previous to this event, a 3-day ambulatory VEEG was normal.   05/20/2015: He is doing better. He was increased to Keppra '1500mg'$  twice a day after a breakthrough seizure and admission with intubation in August. No seizures since August 14th. No inciting events, no illnesses or other trigger for Marco breakthrough. Everything is better. However sometimes when brother speaks to [name], [name] doesn't hear what Marco brother says and Marco brother has to repeat himself. Marco [name] just stares at brother. Brother wonders if he is having seizures, he blank stares a lot. [name] is tired and not so sharp but he is going through chemotherapy currently. Brother notices it 2-3x a day at least.   MRI 04/2015:personally reviewed images and agree, no intracranial metastasis 1. No acute intracranial process identified. No evidence for intracranial metastasis. 2. Mild to moderate generalized cerebral atrophy  HPI: Marco [name] is a 65 y.o. male here as a follow up. He is a former [name] of dr. Janann [name]. He has a PMHx of seizures, non-small cell lung cancer s/p chemo and radiation, mental retardation.    [name] is with aid who works in Marco group home where [name] resides. Aid provides all information. [name] is on Tornado. It was increased to '1000mg'$  twice a day in February when he had seizures. Last seizure was a month ago. Feb 3rd. [name] was coming downstairs. Seizures just sits there and looks dazed. Doesn't move at all. He stares for 2 minutes per aid ([name] notes report GTCS as well). Marco Keppra is working. Marco last time he had a seizure in February was due to illness. Otherwise Marco medication has helped. He is alert, mood is good, not irritable. Everything is going well.   Reviewed notes, labs and imaging from outside physicians, which showed:  [name] had new onset seizures in 2014. Baseline MR. Lives in a group home. Has had GTCS and episodes of staring. He was hospitalized in February after GTCS. He was persistently post ictal   MRI of Marco brain 10/09/2014 (personally reviewed and agree with findings): Edema and low level restricted diffusion in Marco hippocampus/mesial temporal lobe on Marco left, most typical of postictal change. Marco [name] could be restudied in Marco future distant from any seizure event to see if this area has a normalized appearance or any definable permanent underlying abnormality. CBC/CMP unremarkable HgbA1c 5.6  EEG 10/08/2014: IMPRESSION: This is an abnormal electroencephalogram secondary to general  background slowing. This may be seen with a diffuse disturbance  that is etiologically nonspecific, but may include Marco post-ictal  state, among other possibilities. There were also frequent sharp  transients over Marco left hemisphere  with phase reversal at Marco [name]  consistent with Marco [name]'s history of seizures.      Social History   Social History  . Marital Status: Single    Spouse Name: N/A  . Number of Children: 0  . Years of Education: <12   Occupational History  . Not on file.   Social History Main Topics  . Smoking status: Former Smoker  -- 0.50 packs/day for 35 years    Types: Cigarettes    Quit date: 09/07/2013  . Smokeless tobacco: Not on file  . Alcohol Use: No  . Drug Use: No  . Sexual Activity: No   Other Topics Concern  . Not on file   Social History Narrative   ** Merged History Encounter **       ** Merged History Encounter **       [name] lives in a group with 3 other brothers.    [name] does not work.   [name] is single.    [name] has some HS education.    [name] has no children.    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    Caffeine: Drinks 3 cups coffee per day.     Family History  Problem Relation Age of Onset  . Hypertension Mother   . Hypertension Father     Past Medical History  Diagnosis Date  . Mental disorder     sczizophrenia;moderate retardation  . Depression   . DEMENTIA   . Hypertension     no medications, no documented history per caregiver at preadmission  . GERD (gastroesophageal reflux disease)   . Seizures (Marco [name])   . Developmental disability     developmentaly delayed  . Hx of radiation therapy 11/07/13-12/24/13    lung,60Gy/49f  . Mental retardation   . Seizure disorder ([facility]   . Lung cancer ([facility]   . Encounter for antineoplastic immunotherapy 04/05/2015    Past Surgical History  Procedure Laterality Date  . Cataract extraction w/phaco  07/12/2011    Procedure: CATARACT EXTRACTION PHACO AND INTRAOCULAR LENS PLACEMENT (IOC);  Surgeon: GAdonis Brook  [facility];  Service: Ophthalmology;  Laterality: Left;  .Marland KitchenEye surgery      L eye  . Video bronchoscopy Bilateral 10/01/2013    Procedure: VIDEO BRONCHOSCOPY WITHOUT FLUORO;  Surgeon: RCollene Gobble MD;  [facility]  Service: Cardiopulmonary;  Laterality: Bilateral;    Current Outpatient Prescriptions  Medication Sig Dispense Refill  . LORazepam (ATIVAN) 1 MG tablet Take 2 tablets by mouth at bedtime.    . bisacodyl (DULCOLAX) 5 MG EC tablet Take 5 mg by mouth daily  as needed for moderate constipation (constipation).     .Marland Kitchendonepezil (ARICEPT) 10 MG tablet Take 10 mg by mouth at bedtime.     . fluticasone (FLONASE) 50 MCG/ACT nasal spray Place 2 sprays into both nostrils daily.    .Marland KitchenHYDROcodone-homatropine (HYCODAN) 5-1.5 MG/5ML syrup Take 5 mLs by mouth every 6 (six) hours as needed for cough. 120 mL 0  . Lacosamide 100 MG TABS Take 1.5 tablets (150 mg total) by mouth 2 (two) times daily. 90 tablet 1  . levETIRAcetam (KEPPRA) 750 MG tablet Take 2 tablets (1,500 mg total) by mouth 2 (two) times daily. 120 tablet 0  . Maltodextrin-Xanthan Gum (RESOURCE THICKENUP CLEAR) POWD Take 120 g by mouth as needed. 1 Can 0  . omeprazole (PRILOSEC) 20 MG capsule Take 20 mg by mouth 2 (two) times daily.    .Marland Kitchen  prochlorperazine (COMPAZINE) 10 MG tablet Take 10 mg by mouth every 6 (six) hours as needed for nausea or vomiting.    . risperiDONE (RISPERDAL) 1 MG tablet Take 1 tablet (1 mg total) by mouth at bedtime. 30 tablet 0  . traZODone (DESYREL) 50 MG tablet Take 0.5 tablets (25 mg total) by mouth at bedtime. 30 tablet 0   No current facility-administered medications for this visit.    Allergies as of 09/29/2015  . (No Known Allergies)    Vitals: BP 135/82 mmHg  Pulse 97  Ht '5\' 9"'$  (1.753 m)  Wt 138 lb 3.2 oz (62.687 kg)  BMI 20.40 kg/m2 Last Weight:  Wt Readings from Last 1 Encounters:  09/29/15 138 lb 3.2 oz (62.687 kg)   Last Height:   Ht Readings from Last 1 Encounters:  09/29/15 '5\' 9"'$  (1.753 m)    Speech:  Speech is dysarthric, no aphasia Cognition:  Marco [name] is oriented to person only  Cranial Nerves:  Marco pupils are equal, round, and reactive to light. Visual fields are full to threat. Extraocular movements are intact. Hearing intact to voice. Voice is normal. Shoulder shrug is normal. Marco tongue has normal motion without fasciculations.  Gait:  heel-toe intact  Motor Observation:  No asymmetry, no atrophy, and no involuntary  movements noted. Tone:  Normal muscle tone.   Posture:  Posture is normal. normal erect   Strength:  Strength is V/V in Marco upper and lower limbs.       Assessment/Plan: Marco [name] is a pleasant 65 year old gentleman with a history of mental retardation, schizophrenia and dementia presenting for follow up evaluation for seizure disorder. Has had multiple  breakthrough events resulting in status epilepticus and [name] admission in Marco setting of illness last February an was recently admitte for another seizure.Marland Kitchen Keppra increased to 1500 BID, whe is having staring spells and possibly partial seizures. Found to have lung mass, currently in treatment. Will continue keppra at current dose for now. Will order an ambulatory EEG to evaluate these reported daily events.  1. Will obtain BMP to evaluate electrolytes and kidney function, Vimpat and keppra levels 2. Will increase Vimpat to '200mg'$  bid 3.  dose of Keppra to remain at1500 mg PO BID 4. Rectal diazepam for GTCS  5. While inpatient, dose of Risperdal was lowered from 3 mg to 1 mg QHS and if needed this can be readjusted  Dx: Seizure Schizophrenia Acute respiratory failure with hypoxia Developmental disability Dementia Non-small cell lung cancer Essential hypertension    Sarina Ill, MD  [name] [address] [place], [place] [address]  Phone [phone] Fax [phone]  A total of 40  minutes was spent face-to-face with this [name]. Over half this time was spent on counseling [name] on Marco seizure  diagnosis and different diagnostic and therapeutic options available.

## 2015-10-03 LAB — COMPREHENSIVE METABOLIC PANEL
A/G RATIO: 1.6 (ref 1.1–2.5)
ALBUMIN: 4.1 g/dL (ref 3.6–4.8)
ALK PHOS: 83 IU/L (ref 39–117)
ALT: 6 IU/L (ref 0–44)
AST: 10 IU/L (ref 0–40)
BUN / CREAT RATIO: 11 (ref 10–22)
BUN: 10 mg/dL (ref 8–27)
Bilirubin Total: <0.2 mg/dL (ref 0.0–1.2)
CALCIUM: 8.8 mg/dL (ref 8.6–10.2)
CO2: 26 mmol/L (ref 18–29)
CREATININE: 0.94 mg/dL (ref 0.76–1.27)
Chloride: 101 mmol/L (ref 96–106)
GFR calc Af Amer: 97 mL/min/{1.73_m2} (ref >59)
GFR, EST NON AFRICAN AMERICAN: 84 mL/min/{1.73_m2} (ref >59)
GLOBULIN, TOTAL: 2.6 g/dL (ref 1.5–4.5)
Glucose: 100 mg/dL — ABNORMAL HIGH (ref 65–99)
POTASSIUM: 4 mmol/L (ref 3.5–5.2)
SODIUM: 143 mmol/L (ref 134–144)
Total Protein: 6.7 g/dL (ref 6.0–8.5)

## 2015-10-03 LAB — LEVETIRACETAM LEVEL: Levetiracetam Lvl: 42 ug/mL — ABNORMAL HIGH (ref 10.0–40.0)

## 2015-10-03 LAB — LACOSAMIDE: LACOSAMIDE: 7.4 ug/mL (ref 5.0–10.0)

## 2015-10-03 LAB — CBC
Hematocrit: 39.4 % (ref 37.5–51.0)
Hemoglobin: 13.1 g/dL (ref 12.6–17.7)
MCH: 29 pg (ref 26.6–33.0)
MCHC: 33.2 g/dL (ref 31.5–35.7)
MCV: 87 fL (ref 79–97)
PLATELETS: 182 10*3/uL (ref 150–379)
RBC: 4.51 x10E6/uL (ref 4.14–5.80)
RDW: 15.4 % (ref 12.3–15.4)
WBC: 5.8 10*3/uL (ref 3.4–10.8)

## 2015-10-04 ENCOUNTER — Telehealth: Payer: Self-pay | Admitting: *Deleted

## 2015-10-04 NOTE — Telephone Encounter (Signed)
-----   Message from Melvenia Beam, MD sent at 10/04/2015  7:34 AM EST ----- Labs looked good thanks

## 2015-10-04 NOTE — Telephone Encounter (Signed)
Spoke to [place] about lab work per Dr Jaynee Eagles note. He verbalized understanding.

## 2015-10-18 ENCOUNTER — Ambulatory Visit (HOSPITAL_COMMUNITY)
Admission: RE | Admit: 2015-10-18 | Discharge: 2015-10-18 | Disposition: A | Discharge status: Home / Self Care | Payer: Medicare Other | Source: Ambulatory Visit | Attending: Nurse Practitioner | Admitting: Nurse Practitioner

## 2015-10-18 ENCOUNTER — Other Ambulatory Visit (HOSPITAL_BASED_OUTPATIENT_CLINIC_OR_DEPARTMENT_OTHER): Payer: Medicare Other

## 2015-10-18 ENCOUNTER — Encounter (HOSPITAL_COMMUNITY): Payer: Self-pay

## 2015-10-18 DIAGNOSIS — C3492 Malignant neoplasm of unspecified part of left bronchus or lung: Secondary | ICD-10-CM

## 2015-10-18 DIAGNOSIS — R05 Cough: Secondary | ICD-10-CM | POA: Insufficient documentation

## 2015-10-18 DIAGNOSIS — C3490 Malignant neoplasm of unspecified part of unspecified bronchus or lung: Secondary | ICD-10-CM | POA: Diagnosis present

## 2015-10-18 DIAGNOSIS — I313 Pericardial effusion (noninflammatory): Secondary | ICD-10-CM | POA: Diagnosis not present

## 2015-10-18 DIAGNOSIS — N62 Hypertrophy of breast: Secondary | ICD-10-CM | POA: Diagnosis not present

## 2015-10-18 LAB — CBC WITH DIFFERENTIAL/PLATELET
BASO%: 1.1 % (ref 0.0–2.0)
BASOS ABS: 0.1 10*3/uL (ref 0.0–0.1)
EOS ABS: 0 10*3/uL (ref 0.0–0.5)
EOS%: 0.3 % (ref 0.0–7.0)
HEMATOCRIT: 45.2 % (ref 38.4–49.9)
HEMOGLOBIN: 14.9 g/dL (ref 13.0–17.1)
LYMPH#: 1.2 10*3/uL (ref 0.9–3.3)
LYMPH%: 16.9 % (ref 14.0–49.0)
MCH: 29.3 pg (ref 27.2–33.4)
MCHC: 33 g/dL (ref 32.0–36.0)
MCV: 88.6 fL (ref 79.3–98.0)
MONO#: 0.6 10*3/uL (ref 0.1–0.9)
MONO%: 8.8 % (ref 0.0–14.0)
NEUT#: 5.2 10*3/uL (ref 1.5–6.5)
NEUT%: 72.9 % (ref 39.0–75.0)
PLATELETS: 154 10*3/uL (ref 140–400)
RBC: 5.1 10*6/uL (ref 4.20–5.82)
RDW: 15.1 % — AB (ref 11.0–14.6)
WBC: 7.1 10*3/uL (ref 4.0–10.3)

## 2015-10-18 LAB — COMPREHENSIVE METABOLIC PANEL
ALBUMIN: 3.9 g/dL (ref 3.5–5.0)
ALT: 9 U/L (ref 0–55)
ANION GAP: 8 meq/L (ref 3–11)
AST: 10 U/L (ref 5–34)
Alkaline Phosphatase: 66 U/L (ref 40–150)
BILIRUBIN TOTAL: 0.64 mg/dL (ref 0.20–1.20)
BUN: 13.9 mg/dL (ref 7.0–26.0)
CALCIUM: 9.2 mg/dL (ref 8.4–10.4)
CO2: 29 mEq/L (ref 22–29)
CREATININE: 1.1 mg/dL (ref 0.7–1.3)
Chloride: 104 mEq/L (ref 98–109)
EGFR: 82 mL/min/{1.73_m2} — ABNORMAL LOW (ref >90)
Glucose: 97 mg/dl (ref 70–140)
POTASSIUM: 3.8 meq/L (ref 3.5–5.1)
Sodium: 141 mEq/L (ref 136–145)
Total Protein: 7.2 g/dL (ref 6.4–8.3)

## 2015-10-18 MED ORDER — IOHEXOL 300 MG/ML  SOLN
75.0000 mL | Freq: Once | INTRAMUSCULAR | Status: AC | PRN
  Administered 2015-10-18: 75 mL via INTRAVENOUS

## 2015-10-27 ENCOUNTER — Encounter: Payer: Self-pay | Admitting: Internal Medicine

## 2015-10-27 ENCOUNTER — Telehealth: Payer: Self-pay | Admitting: Internal Medicine

## 2015-10-27 ENCOUNTER — Ambulatory Visit (HOSPITAL_BASED_OUTPATIENT_CLINIC_OR_DEPARTMENT_OTHER): Payer: Medicare Other | Admitting: Internal Medicine

## 2015-10-27 VITALS — BP 103/62 | HR 110 | Temp 97.7°F | Resp 18 | Ht 69.0 in | Wt 136.6 lb

## 2015-10-27 DIAGNOSIS — C3492 Malignant neoplasm of unspecified part of left bronchus or lung: Secondary | ICD-10-CM

## 2015-10-27 DIAGNOSIS — C3412 Malignant neoplasm of upper lobe, left bronchus or lung: Secondary | ICD-10-CM

## 2015-10-27 NOTE — Telephone Encounter (Signed)
Pt confirmed labs/ov per 02/22 POF, gave pt AVS and Calendar...... KJ °

## 2015-10-27 NOTE — Progress Notes (Signed)
[facility] [phone]   [phone]  OFFICE PROGRESS NOTE  [name],[name], MD [address] [place] [address]  DIAGNOSIS: Stage IIA (T2a., N1, M0) non-small cell lung cancer, squamous cell carcinoma presented with central left upper lobe mass as well as hilar lymphadenopathy diagnosed in January of 2015.  PRIOR THERAPY:  1. Concurrent chemoradiation with weekly carboplatin for AUC of 2 and paclitaxel 45 mg/M2, status post 7 cycles. Last cycle was given 12/22/2013  2. Systemic chemotherapy with carboplatin for AUC of 5 and paclitaxel 175 MG/M2 with Neulasta support every 3 weeks. Status post 3 cycles. 3.  Immunotherapy with Nivolumab 3 mg/kg given every 2 weeks. Status post 9 cycles. Discontinued secondary to noncompliance but the patient has a stable disease.  CURRENT THERAPY: Observation.  INTERVAL HISTORY: Marco [name] 65 y.o. male returns to the clinic today for followup visit accompanied by his caregiver. He was treated with immunotherapy with Nivolumab status post 9 cycles and tolerating his treatment fairly well but this was discontinued because of the noncompliance and the patient use to leave the chemotherapy room before finishing his treatment. He has been doing very well recently with no specific complaints. He has been on observation. His feeling much better today. He denied having any nausea or vomiting, no fever or chills. He denied having any chest pain, shortness of breath, or hemoptysis. He denied having any significant weight loss or night sweats. He had repeat CT scan of the chest performed recently and he is here for evaluation and discussion of his scan results.  MEDICAL HISTORY: Past Medical History  Diagnosis Date  . Mental disorder     sczizophrenia;moderate retardation  . Depression   . DEMENTIA   . Hypertension     no medications, no documented history per caregiver at preadmission  . GERD  (gastroesophageal reflux disease)   . Seizures ([place])   . Developmental disability     developmentaly delayed  . Hx of radiation therapy 11/07/13-12/24/13    lung,60Gy/26f  . Mental retardation   . Seizure disorder ([facility]   . Lung cancer ([facility]   . Encounter for antineoplastic immunotherapy 04/05/2015    ALLERGIES:  has No Known Allergies.  MEDICATIONS:  Current Outpatient Prescriptions  Medication Sig Dispense Refill  . bisacodyl (DULCOLAX) 5 MG EC tablet Take 5 mg by mouth daily as needed for moderate constipation (constipation).     . diazepam (DIASTAT ACUDIAL) 10 MG GEL Place 5 mg rectally once. Can repeat in 4-6 hours once. 1 Package 5  . donepezil (ARICEPT) 10 MG tablet Take 10 mg by mouth at bedtime.     . fluticasone (FLONASE) 50 MCG/ACT nasal spray Place 2 sprays into both nostrils daily.    .Marland KitchenHYDROcodone-homatropine (HYCODAN) 5-1.5 MG/5ML syrup Take 5 mLs by mouth every 6 (six) hours as needed for cough. 120 mL 0  . lacosamide (VIMPAT) 200 MG TABS tablet Take 1 tablet (200 mg total) by mouth 2 (two) times daily. 60 tablet 11  . levETIRAcetam (KEPPRA) 750 MG tablet Take 2 tablets (1,500 mg total) by mouth 2 (two) times daily. 120 tablet 0  . LORazepam (ATIVAN) 1 MG tablet Take 2 tablets by mouth at bedtime.    . Maltodextrin-Xanthan Gum (RESOURCE THICKENUP CLEAR) POWD Take 120 g by mouth as needed. 1 Can 0  . omeprazole (PRILOSEC) 20 MG capsule Take 20 mg by mouth 2 (two) times daily.    . prochlorperazine (  COMPAZINE) 10 MG tablet Take 10 mg by mouth every 6 (six) hours as needed for nausea or vomiting.    . risperiDONE (RISPERDAL) 1 MG tablet Take 1 tablet (1 mg total) by mouth at bedtime. 30 tablet 0  . traZODone (DESYREL) 50 MG tablet Take 0.5 tablets (25 mg total) by mouth at bedtime. 30 tablet 0   No current facility-administered medications for this visit.    SURGICAL HISTORY:  Past Surgical History  Procedure Laterality Date  . Cataract extraction w/phaco  07/12/2011     Procedure: CATARACT EXTRACTION PHACO AND INTRAOCULAR LENS PLACEMENT (IOC);  Surgeon: Adonis Brook;  Location: [facility];  Service: Ophthalmology;  Laterality: Left;  Marland Kitchen Eye surgery      L eye  . Video bronchoscopy Bilateral 10/01/2013    Procedure: VIDEO BRONCHOSCOPY WITHOUT FLUORO;  Surgeon: Collene Gobble, MD;  Location: [facility];  Service: Cardiopulmonary;  Laterality: Bilateral;    REVIEW OF SYSTEMS:  A comprehensive review of systems was negative.   PHYSICAL EXAMINATION: General appearance: alert, cooperative and no distress Head: Normocephalic, without obvious abnormality, atraumatic Neck: no adenopathy, no JVD, supple, symmetrical, trachea midline and thyroid not enlarged, symmetric, no tenderness/mass/nodules Lymph nodes: Cervical, supraclavicular, and axillary nodes normal. Resp: clear to auscultation bilaterally Back: symmetric, no curvature. ROM normal. No CVA tenderness. Cardio: regular rate and rhythm, S1, S2 normal, no murmur, click, rub or gallop GI: soft, non-tender; bowel sounds normal; no masses,  no organomegaly Extremities: extremities normal, atraumatic, no cyanosis or edema Neurologic: Alert and oriented X 3, normal strength and tone. Normal symmetric reflexes. Normal coordination and gait  ECOG PERFORMANCE STATUS: 1 - Symptomatic but completely ambulatory  Blood pressure 103/62, pulse 110, temperature 97.7 F (36.5 C), temperature source Oral, resp. rate 18, height '5\' 9"'$  (1.753 m), weight 136 lb 9.6 oz (61.961 kg), SpO2 100 %.  LABORATORY DATA: Lab Results  Component Value Date   WBC 7.1 10/18/2015   HGB 14.9 10/18/2015   HCT 45.2 10/18/2015   MCV 88.6 10/18/2015   PLT 154 10/18/2015      Chemistry      Component Value Date/Time   NA 141 10/18/2015 1027   NA 143 09/29/2015 1346   NA 146* 08/23/2015 0430   K 3.8 10/18/2015 1027   K 4.0 09/29/2015 1346   CL 101 09/29/2015 1346   CO2 29 10/18/2015 1027   CO2 26 09/29/2015 1346   BUN 13.9 10/18/2015 1027    BUN 10 09/29/2015 1346   BUN 14 08/23/2015 0430   CREATININE 1.1 10/18/2015 1027   CREATININE 0.94 09/29/2015 1346      Component Value Date/Time   CALCIUM 9.2 10/18/2015 1027   CALCIUM 8.8 09/29/2015 1346   ALKPHOS 66 10/18/2015 1027   ALKPHOS 83 09/29/2015 1346   AST 10 10/18/2015 1027   AST 10 09/29/2015 1346   ALT 9 10/18/2015 1027   ALT 6 09/29/2015 1346   BILITOT 0.64 10/18/2015 1027   BILITOT <0.2 09/29/2015 1346   BILITOT 0.6 08/22/2015 2157       RADIOGRAPHIC STUDIES: Ct Chest W Contrast  10/18/2015  CLINICAL DATA:  Non-small-cell lung cancer diagnosed 4/15. Dry cough. Immunotherapy in progress. EXAM: CT CHEST WITH CONTRAST TECHNIQUE: Multidetector CT imaging of the chest was performed during intravenous contrast administration. CONTRAST:  48m OMNIPAQUE IOHEXOL 300 MG/ML  SOLN COMPARISON:  09/16/2014 FINDINGS: Mediastinum/Nodes: No supraclavicular adenopathy. Left-sided gynecomastia is moderate. Mild cardiomegaly with similar small pericardial effusion. No central pulmonary embolism, on this  non-dedicated study. No mediastinal or hilar adenopathy. Mild esophageal dilatation with fluid level within. Lungs/Pleura: No pleural fluid. Mild-to-moderate motion degradation throughout. A nodule along the right minor fissure measures 4 mm on image 24/series 6 and may have been present on the prior. Subtle inferior lateral right upper lobe reticular nodular opacities are mild. Of indeterminate acuity, given motion on the prior. Nodule along the central right minor fissure is unchanged at 3 mm on image 32/series 6. Posterior left upper lobe lung mass measures 6.1 x 3.7 cm on image 22/series 3. Compare 6.1 x 3.5 cm on the prior. 5.6 cm craniocaudal on coronal image 50 versus 5.5 cm on the prior. Slightly less masslike in near the posterior left apex. Example 1.5 cm on image 18/ series 3 today versus 1.7 cm on the prior. Upper abdomen: Normal imaged portions of the liver, spleen, stomach,  pancreas, gallbladder, adrenal glands, kidneys. Musculoskeletal:  No acute osseous abnormality. IMPRESSION: 1. Mild to moderately motion degraded exam. 2. Similar to minimal decreased size of a posterior left upper lobe lung mass. 3. No thoracic adenopathy or new sites of disease. 4. Similar nonspecific 3 mm nodule along the right minor fissure. Inferior right upper lobe reticular nodular opacity is mild and likely post infectious or inflammatory. Of indeterminate acuity. 5. Similar small pericardial effusion. 6. Esophageal air fluid level suggests dysmotility or gastroesophageal reflux. 7. Left-sided gynecomastia. Electronically Signed   By: Abigail Miyamoto M.D.   On: 10/18/2015 14:05   ASSESSMENT AND PLAN: This is a very pleasant 65 years old [place] American male with unresectable a stage IIA non-small cell lung cancer. He completed a course of concurrent chemoradiation with weekly carboplatin and paclitaxel status post 7 cycles. Has been observation since his treatment in April 2015.  His restaging CT scan of the chest showed further enlargement of the left upper lobe suprahilar mass concerning for disease recurrence. The patient was treated with 3 cycles of systemic chemotherapy with carboplatin and paclitaxel but unfortunately has evidence for disease progression. He was started on treatment with immunotherapy with Nivolumab status post 9 cycles and tolerating his treatment fairly well. He has been observation for the last few months and has no specific complaints. The recent CT scan of the chest, abdomen and pelvis showed no evidence for disease progression. I discussed the scan results with the patient and his caregiver. I recommended for him to continue on observation with repeat CT scan of the chest and follow-up visit in 3 months.  He was advised to call immediately if he has any concerning symptoms in the interval. The patient voices understanding of current disease status and treatment options  and is in agreement with the current care plan.  All questions were answered. The patient knows to call the clinic with any problems, questions or concerns. We can certainly see the patient much sooner if necessary.  Disclaimer: This note was dictated with voice recognition software. Similar sounding words can inadvertently be transcribed and may not be corrected upon review.

## 2015-11-17 ENCOUNTER — Other Ambulatory Visit: Payer: Self-pay | Admitting: Medical Oncology

## 2015-11-17 DIAGNOSIS — C3492 Malignant neoplasm of unspecified part of left bronchus or lung: Secondary | ICD-10-CM

## 2015-11-17 MED ORDER — HYDROCODONE-HOMATROPINE 5-1.5 MG/5ML PO SYRP: 5.0000 mL | ORAL_SOLUTION | Freq: Four times a day (QID) | ORAL | Status: DC | PRN

## 2016-01-04 ENCOUNTER — Other Ambulatory Visit: Payer: Self-pay

## 2016-01-04 ENCOUNTER — Emergency Department (HOSPITAL_COMMUNITY)
Admission: EM | Admit: 2016-01-04 | Discharge: 2016-01-04 | Disposition: A | Discharge status: Home / Self Care | Payer: Medicare Other | Attending: Emergency Medicine | Admitting: Emergency Medicine

## 2016-01-04 ENCOUNTER — Encounter (HOSPITAL_COMMUNITY): Payer: Self-pay | Admitting: Emergency Medicine

## 2016-01-04 ENCOUNTER — Emergency Department (HOSPITAL_COMMUNITY): Payer: Medicare Other

## 2016-01-04 DIAGNOSIS — F0390 Unspecified dementia without behavioral disturbance: Secondary | ICD-10-CM | POA: Insufficient documentation

## 2016-01-04 DIAGNOSIS — Z7951 Long term (current) use of inhaled steroids: Secondary | ICD-10-CM | POA: Insufficient documentation

## 2016-01-04 DIAGNOSIS — F329 Major depressive disorder, single episode, unspecified: Secondary | ICD-10-CM | POA: Diagnosis not present

## 2016-01-04 DIAGNOSIS — Z85118 Personal history of other malignant neoplasm of bronchus and lung: Secondary | ICD-10-CM | POA: Diagnosis not present

## 2016-01-04 DIAGNOSIS — Z87891 Personal history of nicotine dependence: Secondary | ICD-10-CM | POA: Insufficient documentation

## 2016-01-04 DIAGNOSIS — G40909 Epilepsy, unspecified, not intractable, without status epilepticus: Secondary | ICD-10-CM | POA: Insufficient documentation

## 2016-01-04 DIAGNOSIS — Z79899 Other long term (current) drug therapy: Secondary | ICD-10-CM | POA: Insufficient documentation

## 2016-01-04 DIAGNOSIS — K219 Gastro-esophageal reflux disease without esophagitis: Secondary | ICD-10-CM | POA: Diagnosis not present

## 2016-01-04 DIAGNOSIS — Z923 Personal history of irradiation: Secondary | ICD-10-CM | POA: Insufficient documentation

## 2016-01-04 DIAGNOSIS — R569 Unspecified convulsions: Secondary | ICD-10-CM

## 2016-01-04 LAB — COMPREHENSIVE METABOLIC PANEL
ALBUMIN: 4.5 g/dL (ref 3.5–5.0)
ALT: 12 U/L — ABNORMAL LOW (ref 17–63)
AST: 23 U/L (ref 15–41)
Alkaline Phosphatase: 59 U/L (ref 38–126)
Anion gap: 19 — ABNORMAL HIGH (ref 5–15)
BUN: 18 mg/dL (ref 6–20)
CHLORIDE: 107 mmol/L (ref 101–111)
CO2: 17 mmol/L — AB (ref 22–32)
Calcium: 9.3 mg/dL (ref 8.9–10.3)
Creatinine, Ser: 1.31 mg/dL — ABNORMAL HIGH (ref 0.61–1.24)
GFR calc Af Amer: >60 mL/min (ref >60)
GFR calc non Af Amer: 55 mL/min — ABNORMAL LOW (ref >60)
GLUCOSE: 196 mg/dL — AB (ref 65–99)
POTASSIUM: 3.4 mmol/L — AB (ref 3.5–5.1)
SODIUM: 143 mmol/L (ref 135–145)
Total Bilirubin: 0.4 mg/dL (ref 0.3–1.2)
Total Protein: 7.5 g/dL (ref 6.5–8.1)

## 2016-01-04 LAB — URINALYSIS, ROUTINE W REFLEX MICROSCOPIC
BILIRUBIN URINE: NEGATIVE
Glucose, UA: NEGATIVE mg/dL
HGB URINE DIPSTICK: NEGATIVE
Ketones, ur: NEGATIVE mg/dL
Leukocytes, UA: NEGATIVE
NITRITE: NEGATIVE
PROTEIN: NEGATIVE mg/dL
SPECIFIC GRAVITY, URINE: 1.022 (ref 1.005–1.030)
pH: 6 (ref 5.0–8.0)

## 2016-01-04 LAB — CBC
HEMATOCRIT: 47.7 % (ref 39.0–52.0)
Hemoglobin: 15.6 g/dL (ref 13.0–17.0)
MCH: 30.2 pg (ref 26.0–34.0)
MCHC: 32.7 g/dL (ref 30.0–36.0)
MCV: 92.3 fL (ref 78.0–100.0)
PLATELETS: 159 10*3/uL (ref 150–400)
RBC: 5.17 MIL/uL (ref 4.22–5.81)
RDW: 13.9 % (ref 11.5–15.5)
WBC: 8.7 10*3/uL (ref 4.0–10.5)

## 2016-01-04 MED ORDER — POTASSIUM CHLORIDE CRYS ER 20 MEQ PO TBCR
40.0000 meq | EXTENDED_RELEASE_TABLET | Freq: Once | ORAL | Status: AC
  Administered 2016-01-04: 40 meq via ORAL

## 2016-01-04 MED ORDER — LORAZEPAM 2 MG/ML IJ SOLN
INTRAMUSCULAR | Status: AC
Start: 2016-01-04 — End: 2016-01-04
  Administered 2016-01-04: 09:00:00
  Filled 2016-01-04: qty 1

## 2016-01-04 MED ORDER — SODIUM CHLORIDE 0.9 % IV SOLN
INTRAVENOUS | Status: DC
  Administered 2016-01-04: 10:00:00 via INTRAVENOUS

## 2016-01-04 MED ORDER — LORAZEPAM 2 MG/ML IJ SOLN
1.0000 mg | Freq: Once | INTRAMUSCULAR | Status: AC
  Administered 2016-01-04: 1 mg via INTRAVENOUS

## 2016-01-04 MED ORDER — SODIUM CHLORIDE 0.9 % IV BOLUS (SEPSIS)
1000.0000 mL | Freq: Once | INTRAVENOUS | Status: AC
  Administered 2016-01-04: 1000 mL via INTRAVENOUS

## 2016-01-04 NOTE — ED Notes (Signed)
Pt. Used the restroom while in CT and attempted the in and out cath but there was no urine return. RN is aware and will attempt the in and out cath again once he get some fluids.

## 2016-01-04 NOTE — ED Notes (Signed)
Bed: WC58 Expected date:  Expected time:  Means of arrival:  Comments: seizure

## 2016-01-04 NOTE — Discharge Instructions (Signed)
It was our pleasure to provide your ER care today - we hope that you feel better.  Continue your seizure medications.  Contact your doctor to inquire as to whether they want to make any adjustment to your seizure medication doses.  Follow up with your doctor/neurologist in the coming week.  Return to ER if worse, new symptoms, fevers, recurrent seizures, trouble breathing, change in mental status, other concern.     Seizure, Adult A seizure is abnormal electrical activity in the brain. Seizures usually last from 30 seconds to 2 minutes. There are various types of seizures. Before a seizure, you may have a warning sensation (aura) that a seizure is about to occur. An aura may include the following symptoms:   Fear or anxiety.  Nausea.  Feeling like the room is spinning (vertigo).  Vision changes, such as seeing flashing lights or spots. Common symptoms during a seizure include:  A change in attention or behavior (altered mental status).  Convulsions with rhythmic jerking movements.  Drooling.  Rapid eye movements.  Grunting.  Loss of bladder and bowel control.  Bitter taste in the mouth.  Tongue biting. After a seizure, you may feel confused and sleepy. You may also have an injury resulting from convulsions during the seizure. HOME CARE INSTRUCTIONS   If you are given medicines, take them exactly as prescribed by your health care provider.  Keep all follow-up appointments as directed by your health care provider.  Do not swim or drive or engage in risky activity during which a seizure could cause further injury to you or others until your health care provider says it is OK.  Get adequate rest.  Teach friends and family what to do if you have a seizure. They should:  Lay you on the ground to prevent a fall.  Put a cushion under your head.  Loosen any tight clothing around your neck.  Turn you on your side. If vomiting occurs, this helps keep your airway  clear.  Stay with you until you recover.  Know whether or not you need emergency care. SEEK IMMEDIATE MEDICAL CARE IF:  The seizure lasts longer than 5 minutes.  The seizure is severe or you do not wake up immediately after the seizure.  You have an altered mental status after the seizure.  You are having more frequent or worsening seizures. Someone should drive you to the emergency department or call local emergency services (911 in [place] MAKE SURE YOU:  Understand these instructions.  Will watch your condition.  Will get help right away if you are not doing well or get worse.   This information is not intended to replace advice given to you by your health care provider. Make sure you discuss any questions you have with your health care provider.   Document Released: 08/18/2000 Document Revised: 09/11/2014 Document Reviewed: 04/02/2013 Elsevier Interactive Patient Education [employer].

## 2016-01-04 NOTE — ED Provider Notes (Signed)
CSN: [id number]     Arrival date & time 01/04/16  4098 History   First MD Initiated Contact with Patient 01/04/16 [phone]     Chief Complaint  Patient presents with  . Seizures     (Consider location/radiation/quality/duration/timing/severity/associated sxs/prior Treatment) Patient is a 65 y.o. male presenting with seizures. The history is provided by the patient and the EMS personnel. The history is limited by the condition of the patient.  Seizures Patient with hx seizures, presents from group home w report of seizures. gen tonic clonic lasting 1-2 minutes, postictal afterwards.  By report, taking normal home meds, including seizure meds. No report of trauma, injury or fall. No incontinence. No report of fevers. Patient not verbally responsive - post ictal - level 5 caveat.     Past Medical History  Diagnosis Date  . Mental disorder     sczizophrenia;moderate retardation  . Depression   . DEMENTIA   . Hypertension     no medications, no documented history per caregiver at preadmission  . GERD (gastroesophageal reflux disease)   . Seizures ([place])   . Developmental disability     developmentaly delayed  . Hx of radiation therapy 11/07/13-12/24/13    lung,60Gy/65f  . Mental retardation   . [place]iz[place] disorder ([place]   . Lung cancer ([place]   . Encounter for antineoplastic immunotherapy 04/05/2015   Past Surgical History  Procedure Laterality Date  . Cataract extraction w/phaco  07/12/2011    Procedure: CATARACT EXTRACTION PHACO AND INTRAOCULAR LENS PLACEMENT (IOC);  Surgeon: GAdonis Brook  [facility] [facility]  Service: Ophthalmology;  Laterality: Left;  .Marland KitchenEye surgery      L eye  . Video bronchoscopy Bilateral 10/01/2013    Procedure: VIDEO BRONCHOSCOPY WITHOUT FLUORO;  Surgeon: RCollene Gobble MD;  [place] [facility]  Service: Cardiopulmonary;  Laterality: Bilateral;   Family History  Problem Relation Age of Onset  . Hypertension Mother   . Hypertension Father    Social History   Substance Use Topics  . Smoking status: Former Smoker -- 0.50 packs/day for 35 years    Types: Cigarettes    Quit date: 09/07/2013  . Smokeless tobacco: None  . Alcohol Use: No       Review of Systems  Unable to perform ROS: Patient unresponsive  Neurological: Positive for seizures.  level 5 caveat, seizure, post ictal, not verbally responsive.     Allergies  Review of patient's allergies indicates no known allergies.  Home Medications   Prior to Admission medications   Medication Sig Start Date End Date Taking? Authorizing Provider  acetaminophen (TYLENOL) 500 MG tablet Take 500 mg by mouth every 4 (four) hours as needed for headache.   Yes Historical Provider, MD  bisacodyl (DULCOLAX) 5 MG EC tablet Take 5 mg by mouth daily as needed for moderate constipation (constipation).    Yes Historical Provider, MD  diazepam (DIASTAT ACUDIAL) 10 MG GEL Place 5 mg rectally once. Can repeat in 4-6 hours once. 09/29/15  Yes AMelvenia Beam MD  donepezil (ARICEPT) 10 MG tablet Take 10 mg by mouth at bedtime.    Yes Historical Provider, MD  fluticasone (FLONASE) 50 MCG/ACT nasal spray Place 2 sprays into both nostrils daily.   Yes Historical Provider, MD  HYDROcodone-homatropine (HYCODAN) 5-1.5 MG/5ML syrup Take 5 mLs by mouth every 6 (six) hours as needed for cough. 11/17/15  Yes MCurt Bears MD  lacosamide (VIMPAT) 200 MG TABS tablet Take 1 tablet (200 mg total) by mouth 2 (two)  times daily. 09/29/15  Yes Melvenia Beam, MD  levETIRAcetam (KEPPRA) 750 MG tablet Take 2 tablets (1,500 mg total) by mouth 2 (two) times daily. 08/25/15  Yes Theodis Blaze, MD  LORazepam (ATIVAN) 1 MG tablet Take 2 tablets by mouth at bedtime. 11/12/12  Yes Historical Provider, MD  Maltodextrin-Xanthan Gum (RESOURCE THICKENUP CLEAR) POWD Take 120 g by mouth as needed. 07/05/15  Yes Robbie Lis, MD  omeprazole (PRILOSEC) 20 MG capsule Take 20 mg by mouth 2 (two) times daily.   Yes Historical Provider, MD   prochlorperazine (COMPAZINE) 10 MG tablet Take 10 mg by mouth every 6 (six) hours as needed for nausea or vomiting.   Yes Historical Provider, MD  risperiDONE (RISPERDAL) 1 MG tablet Take 1 tablet (1 mg total) by mouth at bedtime. 08/25/15  Yes Theodis Blaze, MD  traZODone (DESYREL) 50 MG tablet Take 0.5 tablets (25 mg total) by mouth at bedtime. 07/05/15  Yes Robbie Lis, MD   BP 110/82 mmHg  Pulse 118  Temp(Src) 98.1 F (36.7 C) (Oral)  Resp 24  SpO2 96% Physical Exam  Constitutional:  Thin appearing, tachycardic, postictal.   HENT:  Head: Atraumatic.  Mouth/Throat: Oropharynx is clear and moist.  No oral injury. No scalp or facial sts or tenderness.   Eyes: Conjunctivae and EOM are normal. Pupils are equal, round, and reactive to light. No scleral icterus.  Neck: Neck supple. No tracheal deviation present. No thyromegaly present.  No stiffness or rigidity.   Cardiovascular: Regular rhythm, normal heart sounds and intact distal pulses.  Exam reveals no gallop and no friction rub.   No murmur heard. Pulmonary/Chest: Effort normal and breath sounds normal. No accessory muscle usage. No respiratory distress.  Abdominal: Soft. Bowel sounds are normal. He exhibits no distension. There is no tenderness.  Genitourinary:  No cva tenderness  Musculoskeletal: Normal range of motion. He exhibits no edema or tenderness.  CTLS spine, non tender, aligned, no step off. No focal bony tenderness on ext exam.   Neurological:  Postictal, not verbally responsive.  Unable to follow commands.   Skin: Skin is warm. No rash noted.  Psychiatric:  Postictal.   Nursing note and vitals reviewed.   ED Course  Procedures (including critical care time) Labs Review   Results for orders placed or performed during the hospital encounter of 01/04/16  CBC  Result Value Ref Range   WBC 8.7 4.0 - 10.5 K/uL   RBC 5.17 4.22 - 5.81 MIL/uL   Hemoglobin 15.6 13.0 - 17.0 g/dL   HCT 47.7 39.0 - 52.0 %   MCV  92.3 78.0 - 100.0 fL   MCH 30.2 26.0 - 34.0 pg   MCHC 32.7 30.0 - 36.0 g/dL   RDW 13.9 11.5 - 15.5 %   Platelets 159 150 - 400 K/uL  Comprehensive metabolic panel  Result Value Ref Range   Sodium 143 135 - 145 mmol/L   Potassium 3.4 (L) 3.5 - 5.1 mmol/L   Chloride 107 101 - 111 mmol/L   CO2 17 (L) 22 - 32 mmol/L   Glucose, Bld 196 (H) 65 - 99 mg/dL   BUN 18 6 - 20 mg/dL   Creatinine, Ser 1.31 (H) 0.61 - 1.24 mg/dL   Calcium 9.3 8.9 - 10.3 mg/dL   Total Protein 7.5 6.5 - 8.1 g/dL   Albumin 4.5 3.5 - 5.0 g/dL   AST 23 15 - 41 U/L   ALT 12 (L) 17 - 63 U/L  Alkaline Phosphatase 59 38 - 126 U/L   Total Bilirubin 0.4 0.3 - 1.2 mg/dL   GFR calc non Af Amer 55 (L) >60 mL/min   GFR calc Af Amer >60 >60 mL/min   Anion gap 19 (H) 5 - 15  Urinalysis, Routine w reflex microscopic (not at [facility])  Result Value Ref Range   Color, Urine YELLOW YELLOW   APPearance CLEAR CLEAR   Specific Gravity, Urine 1.022 1.005 - 1.030   pH 6.0 5.0 - 8.0   Glucose, UA NEGATIVE NEGATIVE mg/dL   Hgb urine dipstick NEGATIVE NEGATIVE   Bilirubin Urine NEGATIVE NEGATIVE   Ketones, ur NEGATIVE NEGATIVE mg/dL   Protein, ur NEGATIVE NEGATIVE mg/dL   Nitrite NEGATIVE NEGATIVE   Leukocytes, UA NEGATIVE NEGATIVE   Ct Head Wo Contrast  01/04/2016  CLINICAL DATA:  65 year old male status post seizure at group home today. Not responding to questions. Initial encounter. Non-small cell lung cancer. EXAM: CT HEAD WITHOUT CONTRAST TECHNIQUE: Contiguous axial images were obtained from the base of the skull through the vertex without intravenous contrast. COMPARISON:  Head CT 06/27/2015 and earlier. FINDINGS: Move Visualized paranasal sinuses and mastoids are clear. No acute osseous abnormality identified. Negative orbit and scalp soft tissues. The cerebral volume is stable. No ventriculomegaly. Stable and normal for age gray-white matter differentiation throughout the brain. No cortically based acute infarct identified. No  midline shift, mass effect, or evidence of intracranial mass lesion. No acute intracranial hemorrhage identified. No suspicious intracranial vascular hyperdensity. IMPRESSION: Stable non contrast CT appearance of the brain. Note that early Metastatic disease to the brain cannot be excluded in the absence of intravenous contrast. Electronically Signed   By: Genevie Ann M.D.   On: 01/04/2016 10:14       I have personally reviewed and evaluated these images and lab results as part of my medical decision-making.   MDM   Labs. Seizure precautions. Ct.  Continuous pulse ox and monitor.   Reviewed nursing notes and prior charts for additional history.   Seizure in ED, generalized tonic clonic, lasted approx 1 minute.  Ativan 1 mg iv.   k slightly low, kcl po.  Po fluids.  Recheck sitting upright on stretcher, smiling, alert. Denies c/o.   Caregiver now present, indicates patients mental status appears consistent with baseline.   No c/o pain. Spine nt. No recurrent seizure.  Pt currently appears stable for d/c.      Lajean Saver, MD 01/04/16 1246

## 2016-01-04 NOTE — ED Notes (Signed)
Patient here from [place] with complaints of seizures. Hx of same.

## 2016-01-06 ENCOUNTER — Other Ambulatory Visit: Payer: Self-pay | Admitting: *Deleted

## 2016-01-06 MED ORDER — HYDROCODONE-HOMATROPINE 5-1.5 MG/5ML PO SYRP: 5.0000 mL | ORAL_SOLUTION | Freq: Four times a day (QID) | ORAL | Status: DC | PRN

## 2016-01-06 NOTE — Telephone Encounter (Signed)
Hycodan refill request. Reviewed with MD, notified [name] who advised he will pick up rx for pt who resides in Group home.

## 2016-01-10 ENCOUNTER — Other Ambulatory Visit: Payer: Self-pay | Admitting: Ophthalmology

## 2016-01-12 ENCOUNTER — Ambulatory Visit (HOSPITAL_COMMUNITY): Admission: RE | Admit: 2016-01-12 | Payer: Medicare Other | Source: Ambulatory Visit | Admitting: Ophthalmology

## 2016-01-12 ENCOUNTER — Encounter (HOSPITAL_COMMUNITY): Admission: RE | Payer: Self-pay | Source: Ambulatory Visit

## 2016-01-12 SURGERY — PHACOEMULSIFICATION, CATARACT, WITH IOL INSERTION
Anesthesia: General | Laterality: Right

## 2016-01-25 ENCOUNTER — Other Ambulatory Visit: Payer: Medicare Other

## 2016-01-25 ENCOUNTER — Telehealth: Payer: Self-pay | Admitting: Medical Oncology

## 2016-01-25 ENCOUNTER — Inpatient Hospital Stay (HOSPITAL_COMMUNITY): Admit: 2016-01-25 | Discharge: 2016-01-25 | Disposition: A | Discharge status: Home / Self Care | Payer: Medicare Other

## 2016-01-25 ENCOUNTER — Emergency Department (HOSPITAL_COMMUNITY): Payer: Medicare Other

## 2016-01-25 ENCOUNTER — Inpatient Hospital Stay (HOSPITAL_COMMUNITY): Payer: Medicare Other

## 2016-01-25 ENCOUNTER — Encounter (HOSPITAL_COMMUNITY): Payer: Self-pay | Admitting: Nurse Practitioner

## 2016-01-25 ENCOUNTER — Inpatient Hospital Stay (HOSPITAL_COMMUNITY)
Admission: EM | Admit: 2016-01-25 | Discharge: 2016-01-30 | DRG: 100 | Disposition: A | Discharge status: Home / Self Care | Payer: Medicare Other | Attending: Internal Medicine | Admitting: Internal Medicine

## 2016-01-25 ENCOUNTER — Ambulatory Visit (HOSPITAL_COMMUNITY): Payer: Medicare Other

## 2016-01-25 DIAGNOSIS — Z9221 Personal history of antineoplastic chemotherapy: Secondary | ICD-10-CM | POA: Diagnosis not present

## 2016-01-25 DIAGNOSIS — Z7951 Long term (current) use of inhaled steroids: Secondary | ICD-10-CM

## 2016-01-25 DIAGNOSIS — Z923 Personal history of irradiation: Secondary | ICD-10-CM | POA: Diagnosis not present

## 2016-01-25 DIAGNOSIS — F0390 Unspecified dementia without behavioral disturbance: Secondary | ICD-10-CM | POA: Diagnosis not present

## 2016-01-25 DIAGNOSIS — C3492 Malignant neoplasm of unspecified part of left bronchus or lung: Secondary | ICD-10-CM | POA: Diagnosis present

## 2016-01-25 DIAGNOSIS — Z961 Presence of intraocular lens: Secondary | ICD-10-CM | POA: Diagnosis present

## 2016-01-25 DIAGNOSIS — Z681 Body mass index (BMI) 19 or less, adult: Secondary | ICD-10-CM

## 2016-01-25 DIAGNOSIS — G9340 Encephalopathy, unspecified: Secondary | ICD-10-CM | POA: Diagnosis present

## 2016-01-25 DIAGNOSIS — K219 Gastro-esophageal reflux disease without esophagitis: Secondary | ICD-10-CM | POA: Diagnosis present

## 2016-01-25 DIAGNOSIS — F329 Major depressive disorder, single episode, unspecified: Secondary | ICD-10-CM | POA: Diagnosis present

## 2016-01-25 DIAGNOSIS — H5442 Blindness, left eye, normal vision right eye: Secondary | ICD-10-CM | POA: Diagnosis present

## 2016-01-25 DIAGNOSIS — F72 Severe intellectual disabilities: Secondary | ICD-10-CM | POA: Diagnosis present

## 2016-01-25 DIAGNOSIS — Z79899 Other long term (current) drug therapy: Secondary | ICD-10-CM | POA: Diagnosis not present

## 2016-01-25 DIAGNOSIS — R569 Unspecified convulsions: Secondary | ICD-10-CM | POA: Diagnosis present

## 2016-01-25 DIAGNOSIS — R4182 Altered mental status, unspecified: Secondary | ICD-10-CM

## 2016-01-25 DIAGNOSIS — Z87891 Personal history of nicotine dependence: Secondary | ICD-10-CM | POA: Diagnosis not present

## 2016-01-25 DIAGNOSIS — J189 Pneumonia, unspecified organism: Secondary | ICD-10-CM

## 2016-01-25 DIAGNOSIS — E43 Unspecified severe protein-calorie malnutrition: Secondary | ICD-10-CM | POA: Diagnosis present

## 2016-01-25 DIAGNOSIS — I10 Essential (primary) hypertension: Secondary | ICD-10-CM | POA: Diagnosis present

## 2016-01-25 DIAGNOSIS — C3490 Malignant neoplasm of unspecified part of unspecified bronchus or lung: Secondary | ICD-10-CM | POA: Diagnosis present

## 2016-01-25 DIAGNOSIS — Z85118 Personal history of other malignant neoplasm of bronchus and lung: Secondary | ICD-10-CM

## 2016-01-25 DIAGNOSIS — G40909 Epilepsy, unspecified, not intractable, without status epilepticus: Secondary | ICD-10-CM | POA: Diagnosis present

## 2016-01-25 DIAGNOSIS — Z9842 Cataract extraction status, left eye: Secondary | ICD-10-CM

## 2016-01-25 DIAGNOSIS — Z8249 Family history of ischemic heart disease and other diseases of the circulatory system: Secondary | ICD-10-CM

## 2016-01-25 DIAGNOSIS — F05 Delirium due to known physiological condition: Secondary | ICD-10-CM

## 2016-01-25 DIAGNOSIS — C3400 Malignant neoplasm of unspecified main bronchus: Secondary | ICD-10-CM

## 2016-01-25 LAB — URINALYSIS, ROUTINE W REFLEX MICROSCOPIC
BILIRUBIN URINE: NEGATIVE
GLUCOSE, UA: NEGATIVE mg/dL
HGB URINE DIPSTICK: NEGATIVE
Ketones, ur: 15 mg/dL — AB
Leukocytes, UA: NEGATIVE
Nitrite: NEGATIVE
PROTEIN: NEGATIVE mg/dL
Specific Gravity, Urine: 1.02 (ref 1.005–1.030)
pH: 5 (ref 5.0–8.0)

## 2016-01-25 LAB — CBC
HCT: 44.1 % (ref 39.0–52.0)
Hemoglobin: 15 g/dL (ref 13.0–17.0)
MCH: 29.9 pg (ref 26.0–34.0)
MCHC: 34 g/dL (ref 30.0–36.0)
MCV: 87.8 fL (ref 78.0–100.0)
Platelets: 171 10*3/uL (ref 150–400)
RBC: 5.02 MIL/uL (ref 4.22–5.81)
RDW: 13.5 % (ref 11.5–15.5)
WBC: 15.1 10*3/uL — ABNORMAL HIGH (ref 4.0–10.5)

## 2016-01-25 LAB — BASIC METABOLIC PANEL
ANION GAP: 9 (ref 5–15)
BUN: 22 mg/dL — AB (ref 6–20)
CALCIUM: 9.3 mg/dL (ref 8.9–10.3)
CO2: 26 mmol/L (ref 22–32)
CREATININE: 1.33 mg/dL — AB (ref 0.61–1.24)
Chloride: 106 mmol/L (ref 101–111)
GFR calc Af Amer: >60 mL/min (ref >60)
GFR calc non Af Amer: 54 mL/min — ABNORMAL LOW (ref >60)
GLUCOSE: 85 mg/dL (ref 65–99)
POTASSIUM: 3.3 mmol/L — AB (ref 3.5–5.1)
Sodium: 141 mmol/L (ref 135–145)

## 2016-01-25 LAB — MRSA PCR SCREENING: MRSA BY PCR: NEGATIVE

## 2016-01-25 MED ORDER — LORAZEPAM 2 MG/ML IJ SOLN
1.0000 mg | Freq: Once | INTRAMUSCULAR | Status: AC
  Administered 2016-01-25: 1 mg via INTRAVENOUS
  Filled 2016-01-25: qty 1

## 2016-01-25 MED ORDER — ACETAMINOPHEN 650 MG RE SUPP: 650.0000 mg | Freq: Four times a day (QID) | RECTAL | Status: DC | PRN

## 2016-01-25 MED ORDER — LORAZEPAM 2 MG/ML IJ SOLN: 1.0000 mg | INTRAMUSCULAR | Status: DC | PRN

## 2016-01-25 MED ORDER — ONDANSETRON HCL 4 MG/2ML IJ SOLN
4.0000 mg | Freq: Four times a day (QID) | INTRAMUSCULAR | Status: DC | PRN
  Administered 2016-01-26: 4 mg via INTRAVENOUS
  Filled 2016-01-25: qty 2

## 2016-01-25 MED ORDER — SODIUM CHLORIDE 0.9 % IV SOLN
200.0000 mg | Freq: Two times a day (BID) | INTRAVENOUS | Status: DC
  Administered 2016-01-25: 200 mg via INTRAVENOUS
  Filled 2016-01-25 (×2): qty 20

## 2016-01-25 MED ORDER — CETYLPYRIDINIUM CHLORIDE 0.05 % MT LIQD
7.0000 mL | Freq: Two times a day (BID) | OROMUCOSAL | Status: DC
  Administered 2016-01-26 – 2016-01-30 (×8): 7 mL via OROMUCOSAL

## 2016-01-25 MED ORDER — SODIUM CHLORIDE 0.9 % IV SOLN: INTRAVENOUS | Status: DC

## 2016-01-25 MED ORDER — LORAZEPAM 2 MG/ML IJ SOLN
1.0000 mg | INTRAMUSCULAR | Status: DC | PRN
  Administered 2016-01-25 – 2016-01-27 (×5): 2 mg via INTRAVENOUS
  Filled 2016-01-25 (×5): qty 1

## 2016-01-25 MED ORDER — CHLORHEXIDINE GLUCONATE 0.12 % MT SOLN
15.0000 mL | Freq: Two times a day (BID) | OROMUCOSAL | Status: DC
  Administered 2016-01-26 – 2016-01-30 (×7): 15 mL via OROMUCOSAL
  Filled 2016-01-25 (×10): qty 15

## 2016-01-25 MED ORDER — SODIUM CHLORIDE 0.9 % IV SOLN
1500.0000 mg | Freq: Two times a day (BID) | INTRAVENOUS | Status: DC
  Administered 2016-01-25 – 2016-01-26 (×2): 1500 mg via INTRAVENOUS
  Filled 2016-01-25 (×2): qty 15

## 2016-01-25 MED ORDER — VALPROATE SODIUM 500 MG/5ML IV SOLN
500.0000 mg | Freq: Three times a day (TID) | INTRAVENOUS | Status: DC
  Administered 2016-01-26: 500 mg via INTRAVENOUS
  Filled 2016-01-25 (×2): qty 5

## 2016-01-25 MED ORDER — ACETAMINOPHEN 325 MG PO TABS: 650.0000 mg | ORAL_TABLET | Freq: Four times a day (QID) | ORAL | Status: DC | PRN

## 2016-01-25 MED ORDER — SODIUM CHLORIDE 0.9 % IV SOLN
INTRAVENOUS | Status: DC
  Administered 2016-01-25 – 2016-01-28 (×4): via INTRAVENOUS

## 2016-01-25 MED ORDER — IOPAMIDOL (ISOVUE-300) INJECTION 61%
75.0000 mL | Freq: Once | INTRAVENOUS | Status: AC | PRN
  Administered 2016-01-25: 75 mL via INTRAVENOUS

## 2016-01-25 MED ORDER — ONDANSETRON HCL 4 MG PO TABS: 4.0000 mg | ORAL_TABLET | Freq: Four times a day (QID) | ORAL | Status: DC | PRN

## 2016-01-25 MED ORDER — VALPROATE SODIUM 500 MG/5ML IV SOLN
1000.0000 mg | Freq: Once | INTRAVENOUS | Status: AC
  Administered 2016-01-25: 1000 mg via INTRAVENOUS
  Filled 2016-01-25: qty 10

## 2016-01-25 NOTE — ED Notes (Signed)
Technician from MRI came to pt's beside to assess if pt could tolerate MRI. Per technician pt was too restless to tolerate MRI at this time

## 2016-01-25 NOTE — ED Notes (Signed)
Bed: AG53 Expected date:  Expected time:  Means of arrival:  Comments: [facility]

## 2016-01-25 NOTE — ED Notes (Addendum)
Pt arrives to [facility] after family found him unconscious this morning. He has a hx of seizures and they believe he had a seizure. No obvious trauma and patient does not localize pain. He became alert fairly quickly and responds to verbal stimuli. He has a hx of mental retardation that makes communication difficult which is his baseline. EMS reports they were at his home recently for seizure activity as well. 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 He lives at a group home along with his other family members who also have a hx of MR. The primary caregiver at the group home is potentially his legal guardian but that is unclear at this time. He was recently diagnosed with lung cancer and was scheduled to have a CT scan this morning.

## 2016-01-25 NOTE — H&P (Signed)
History and Physical    Marco [name]:[id number] DOB: 12/16/1950 DOA: 01/25/2016  PCP: Leamon Arnt, MD  Outpatient Specialists:   Patient coming from: group home  Chief Complaint: confused  HPI: Marco [name] is a 65 y.o. male with medical history significant of seizure disorder, small cell lung CA (1/15) s/p chemo & radiation currently being observed, dementia, cognitive developmental delays. He was found confused at his group home today. No one saw him have a seizure but noted that this is how he behaves after a seizure. He has not returned to baseline in the ER and therefore is referred for admission. He was also due for a surveillance CT of the chest today which was done in the ER.   ED Course: Ct head without contrast negative, given 2 mg IV Ativan, CT chest shows enlargement of LUL lung mass and possible post obstructive pneumonitis   Review of Systems:  All other systems reviewed and apart from HPI, are negative.  Past Medical History  Diagnosis Date  . Mental disorder     sczizophrenia;moderate retardation  . Depression   . DEMENTIA   . Hypertension     no medications, no documented history per caregiver at preadmission  . GERD (gastroesophageal reflux disease)   . Seizures ([place])   . Developmental disability     developmentaly delayed  . Hx of radiation therapy 11/07/13-12/24/13    lung,60Gy/63f  . Mental retardation   . [place]   . Lung cancer ([place]   . Encounter for antineoplastic immunotherapy 04/05/2015    Past Surgical History  Procedure Laterality Date  . Cataract extraction w/phaco  07/12/2011    Procedure: CATARACT EXTRACTION PHACO AND INTRAOCULAR LENS PLACEMENT (IOC);  Surgeon: GAdonis Brook  [facility]  Service: Ophthalmology;  Laterality: Left;  .Marland KitchenEye surgery      L eye  . Video bronchoscopy Bilateral 10/01/2013    Procedure: VIDEO BRONCHOSCOPY WITHOUT FLUORO;  Surgeon: RCollene Gobble MD;  [facility] [facility]  Service:  Cardiopulmonary;  Laterality: Bilateral;   Social history:   reports that he quit smoking about 2 years ago. His smoking use included Cigarettes. He has a 17.5 pack-year smoking history. He does not have any smokeless tobacco history on file. He reports that he does not drink alcohol or use illicit drugs.  No Known Allergies  Family History  Problem Relation Age of Onset  . Hypertension Mother   . Hypertension Father      Prior to Admission medications   Medication Sig Start Date End Date Taking? Authorizing Provider  acetaminophen (TYLENOL) 500 MG tablet Take 500 mg by mouth every 4 (four) hours as needed for headache.   Yes Historical Provider, MD  bisacodyl (DULCOLAX) 5 MG EC tablet Take 5 mg by mouth daily as needed for moderate constipation (constipation).    Yes Historical Provider, MD  diazepam (DIASTAT ACUDIAL) 10 MG GEL Place 5 mg rectally once. Can repeat in 4-6 hours once. Patient taking differently: Place 5 mg rectally as needed for seizure. Can repeat in 4-6 hours once. 09/29/15  Yes AMelvenia Beam MD  donepezil (ARICEPT) 10 MG tablet Take 10 mg by mouth at bedtime.    Yes Historical Provider, MD  fluticasone (FLONASE) 50 MCG/ACT nasal spray Place 2 sprays into both nostrils daily.   Yes Historical Provider, MD  HYDROcodone-homatropine (HYCODAN) 5-1.5 MG/5ML syrup Take 5 mLs by mouth every 6 (six) hours as needed for cough. 01/06/16  Yes MCurt Bears  MD  lacosamide (VIMPAT) 200 MG TABS tablet Take 1 tablet (200 mg total) by mouth 2 (two) times daily. 09/29/15  Yes Melvenia Beam, MD  levETIRAcetam (KEPPRA) 750 MG tablet Take 2 tablets (1,500 mg total) by mouth 2 (two) times daily. 08/25/15  Yes Theodis Blaze, MD  LORazepam (ATIVAN) 1 MG tablet Take 2 tablets by mouth at bedtime. 11/12/12  Yes Historical Provider, MD  omeprazole (PRILOSEC) 20 MG capsule Take 20 mg by mouth 2 (two) times daily.   Yes Historical Provider, MD  polyethylene glycol (MIRALAX / GLYCOLAX) packet Take  17 g by mouth daily as needed for mild constipation or moderate constipation.   Yes Historical Provider, MD  prochlorperazine (COMPAZINE) 10 MG tablet Take 10 mg by mouth every 6 (six) hours as needed for nausea or vomiting.   Yes Historical Provider, MD  risperiDONE (RISPERDAL) 1 MG tablet Take 1 tablet (1 mg total) by mouth at bedtime. 08/25/15  Yes Theodis Blaze, MD  traZODone (DESYREL) 50 MG tablet Take 0.5 tablets (25 mg total) by mouth at bedtime. 07/05/15  Yes Robbie Lis, MD  Maltodextrin-Xanthan Gum (RESOURCE THICKENUP CLEAR) POWD Take 120 g by mouth as needed. Patient not taking: Reported on 01/25/2016 07/05/15   Robbie Lis, MD    Physical Exam: Filed Vitals:   01/25/16 1054 01/25/16 1200 01/25/16 1531 01/25/16 1732  BP: 106/79 105/72 120/82 120/83  Pulse: 103 108 108 122  Temp:      TempSrc:      Resp: '16 27 19 16  '$ SpO2: 95% 100% 100% 95%      Constitutional: confused, restless and pulling on bed sheets but does not appear to be in distress Eyes: PERTLA, lids and conjunctivae normal ENMT: Mucous membranes are moist. Posterior pharynx clear of any exudate or lesions. Normal dentition.  Neck: normal, supple, no masses, no thyromegaly Respiratory: clear to auscultation bilaterally, no wheezing, no crackles. Normal respiratory effort. No accessory muscle use.  Cardiovascular: S1 & S2 heard, regular rate and rhythm, no murmurs / rubs / gallops. No extremity edema. 2+ pedal pulses. No carotid bruits.  Abdomen: No distension, no tenderness, no masses palpated. No hepatosplenomegaly. Bowel sounds normal.  Musculoskeletal: no clubbing / cyanosis. No joint deformity upper and lower extremities. Good ROM, no contractures. Normal muscle tone.  Skin: no rashes, lesions, ulcers. No induration Neurologic: CN 2-12 grossly intact. Sensation intact, DTR normal. Strength 5/5 in all 4 limbs.  Psychiatric: confused, non-verbal but alert   Labs on Admission: I have personally reviewed  following labs and imaging studies  CBC:  Recent Labs Lab 01/25/16 0955  WBC 15.1*  HGB 15.0  HCT 44.1  MCV 87.8  PLT 979   Basic Metabolic Panel:  Recent Labs Lab 01/25/16 0955  NA 141  K 3.3*  CL 106  CO2 26  GLUCOSE 85  BUN 22*  CREATININE 1.33*  CALCIUM 9.3   GFR: CrCl cannot be calculated (Unknown ideal weight.). Liver Function Tests: No results for input(s): AST, ALT, ALKPHOS, BILITOT, PROT, ALBUMIN in the last 168 hours. No results for input(s): LIPASE, AMYLASE in the last 168 hours. No results for input(s): AMMONIA in the last 168 hours. Coagulation Profile: No results for input(s): INR, PROTIME in the last 168 hours. Cardiac Enzymes: No results for input(s): CKTOTAL, CKMB, CKMBINDEX, TROPONINI in the last 168 hours. BNP (last 3 results) No results for input(s): PROBNP in the last 8760 hours. HbA1C: No results for input(s): HGBA1C in the last  72 hours. CBG: No results for input(s): GLUCAP in the last 168 hours. Lipid Profile: No results for input(s): CHOL, HDL, LDLCALC, TRIG, CHOLHDL, LDLDIRECT in the last 72 hours. Thyroid Function Tests: No results for input(s): TSH, T4TOTAL, FREET4, T3FREE, THYROIDAB in the last 72 hours. Anemia Panel: No results for input(s): VITAMINB12, FOLATE, FERRITIN, TIBC, IRON, RETICCTPCT in the last 72 hours. Urine analysis:    Component Value Date/Time   COLORURINE YELLOW 01/25/2016 1319   APPEARANCEUR CLOUDY* 01/25/2016 1319   LABSPEC 1.020 01/25/2016 1319   LABSPEC 1.020 03/16/2015 1520   PHURINE 5.0 01/25/2016 1319   PHURINE 6.0 03/16/2015 1520   GLUCOSEU NEGATIVE 01/25/2016 1319   GLUCOSEU Negative 03/16/2015 1520   HGBUR NEGATIVE 01/25/2016 1319   HGBUR Negative 03/16/2015 1520   BILIRUBINUR NEGATIVE 01/25/2016 1319   BILIRUBINUR Negative 03/16/2015 1520   KETONESUR 15* 01/25/2016 1319   KETONESUR Negative 03/16/2015 1520   PROTEINUR NEGATIVE 01/25/2016 1319   PROTEINUR Negative 03/16/2015 1520    UROBILINOGEN 0.2 06/27/2015 1543   UROBILINOGEN 0.2 03/16/2015 1520   NITRITE NEGATIVE 01/25/2016 1319   NITRITE Negative 03/16/2015 1520   LEUKOCYTESUR NEGATIVE 01/25/2016 1319   LEUKOCYTESUR Negative 03/16/2015 1520   Sepsis Labs: '@LABRCNTIP'$ (procalcitonin:4,lacticidven:4) )No results found for this or any previous visit (from the past 240 hour(s)).   Radiological Exams on Admission: Ct Head W Wo Contrast  01/25/2016  CLINICAL DATA:  Found unconscious this morning. Possible seizure. History of seizures and lung cancer. EXAM: CT HEAD WITHOUT AND WITH CONTRAST TECHNIQUE: Contiguous axial images were obtained from the base of the skull through the vertex without and with intravenous contrast CONTRAST:  19m ISOVUE-300 IOPAMIDOL (ISOVUE-300) INJECTION 61% COMPARISON:  Noncontrast head CT 01/04/2016.  Brain MRI 04/17/2015. FINDINGS: There is no evidence of acute cortical infarct, intracranial hemorrhage, mass, midline shift, or extra-axial fluid collection. Mild cerebral atrophy is unchanged. A cavum septum pellucidum et vergae is again noted. There is no evidence of brain edema. No abnormal enhancement is identified within limitations of mild motion artifact. Prior left cataract extraction is again noted. The paranasal sinuses and mastoid air cells are clear. No destructive osseous lesion is seen. IMPRESSION: No evidence of acute intracranial abnormality or intracranial metastatic disease. Electronically Signed   By: ALogan BoresM.D.   On: 01/25/2016 15:58   Ct Chest W Contrast  01/25/2016  CLINICAL DATA:  Found unconscious this morning. History of seizures. History of lung cancer status post radiation therapy. EXAM: CT CHEST WITH CONTRAST TECHNIQUE: Multidetector CT imaging of the chest was performed during intravenous contrast administration. CONTRAST:  73mISOVUE-300 IOPAMIDOL (ISOVUE-300) INJECTION 61% COMPARISON:  10/18/2015 FINDINGS: Mediastinum/Lymph Nodes: Asymmetric left-sided gynecomastia is  similar to the prior study. No supraclavicular, axillary, mediastinal, or right hilar lymph node enlargement is identified. Small pericardial effusion has not significantly changed. Aorta is normal in caliber. No evidence of central pulmonary embolism on this nondedicated study. Mild distention of the esophagus is again seen with a small amount of fluid or debris in its midportion which may reflect dysmotility or reflux. Lungs/Pleura: Medial left upper lobe lung mass has increased in size, measuring 7.1 x 4.4 x 6.7 cm (previously 6.1 x 3.7 x 5.6 cm). Extension superiorly in the posterior left upper lobe along the pleura measures up to 1.6 cm in thickness, stable to minimally increased from prior. There also appears to be mildly increased inferior extension of tumor into the left hilum with narrowing of multiple left upper and now lower lobe bronchi. There are  new patchy opacities posteriorly in the left lower lobe predominantly in the superior segment. Evaluation of the lung parenchyma is mildly limited by motion artifact. 4 mm nodule along the superior aspect of the right major fissure (series 10, image 60) and 3 mm nodule along the central aspect of the right major fissure (series 10, image 79) are unchanged. Subtle nodular opacities in the inferolateral right upper lobe on the prior study have resolved. There is minimal ground-glass and micronodular opacity posteriorly in the right upper lobe which has increased from prior and is likely infectious/inflammatory. No pleural effusion. Upper abdomen: A 2.1 cm right upper pole renal cyst is partially visualized. There also 2 subcentimeter low-density lesions in the upper pole of the left kidney. Musculoskeletal: No suspicious lytic or blastic osseous lesion is identified. IMPRESSION: Increased size of left upper lobe mass, including involvement of the left hilum with narrowing of multiple upper and lower lobe bronchi. New patchy left lower lobe opacity may reflect  postobstructive pneumonitis. Electronically Signed   By: Logan Bores M.D.   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   DVT prophylaxis: SCDs until brain mets ruled out Code Status: Full code  Family Communication: sister is POA  Disposition Plan: will need 1-2 day stay  Consults called: Neuro  Admission status: admit     [facility] MD [facility] Pager: [email or web address] Password [email or web address] 7PM-7AM, please contact night-coverage   01/25/2016, 6:09 PM

## 2016-01-25 NOTE — ED Notes (Signed)
Pt has taken all clothes off and will not keep anything on his body. Moved all cords away from pt

## 2016-01-25 NOTE — ED Notes (Signed)
Spoke with RN in [place], [place], who will be accepting pt. She is updated on pt condition as well as information regarding MRI

## 2016-01-25 NOTE — ED Provider Notes (Signed)
CSN: [id number]     Arrival date & time 01/25/16  3300 History   First MD Initiated Contact with Patient 01/25/16 0901     Chief Complaint  Patient presents with  . Seizures     (Consider location/radiation/quality/duration/timing/severity/associated sxs/prior Treatment) Patient is a 65 y.o. male presenting with seizures. The history is provided by the patient. The history is limited by the condition of the patient.  Seizures Patient with hx seizures, MR, dementia, presents via ems from group home where patient was felt to have seizure this AM. Duration unknown.  Patient reported at baseline to have occasional seizure despite meds.  Compliant w meds. No other recent seizure. No reported hx trauma or fall. No report of fever.  Patient not verbally responsive at baseline - level 5 caveat.  No arrival to ED is alert appearing but very confused, pts baseline unclear.      Past Medical History  Diagnosis Date  . Mental disorder     sczizophrenia;moderate retardation  . Depression   . DEMENTIA   . Hypertension     no medications, no documented history per caregiver at preadmission  . GERD (gastroesophageal reflux disease)   . Seizures ([place])   . Developmental disability     developmentaly delayed  . Hx of radiation therapy 11/07/13-12/24/13    lung,60Gy/64f  . Mental retardation   . [place]   . [place]   . Encounter for antineoplastic immunotherapy 04/05/2015   Past Surgical History  Procedure Laterality Date  . Cataract extraction w/phaco  07/12/2011    Procedure: CATARACT EXTRACTION PHACO AND INTRAOCULAR LENS PLACEMENT (IOC);  Surgeon: GAdonis Brook  [facility];  Service: Ophthalmology;  Laterality: Left;  .Marland KitchenEye surgery      L eye  . Video bronchoscopy Bilateral 10/01/2013    Procedure: VIDEO BRONCHOSCOPY WITHOUT FLUORO;  Surgeon: RCollene Gobble MD;  [facility]  Service: Cardiopulmonary;  Laterality: Bilateral;   Family History  Problem  Relation Age of Onset  . Hypertension Mother   . Hypertension Father    Social History  Substance Use Topics  . Smoking status: Former Smoker -- 0.50 packs/day for 35 years    Types: Cigarettes    Quit date: 09/07/2013  . Smokeless tobacco: None  . Alcohol Use: No      Review of Systems  Unable to perform ROS: Patient nonverbal  Neurological: Positive for seizures.  level 5 caveat, not verbally responsive    Allergies  Review of patient's allergies indicates no known allergies.  Home Medications   Prior to Admission medications   Medication Sig Start Date End Date Taking? Authorizing Provider  acetaminophen (TYLENOL) 500 MG tablet Take 500 mg by mouth every 4 (four) hours as needed for headache.    Historical Provider, MD  bisacodyl (DULCOLAX) 5 MG EC tablet Take 5 mg by mouth daily as needed for moderate constipation (constipation).     Historical Provider, MD  diazepam (DIASTAT ACUDIAL) 10 MG GEL Place 5 mg rectally once. Can repeat in 4-6 hours once. 09/29/15   AMelvenia Beam MD  donepezil (ARICEPT) 10 MG tablet Take 10 mg by mouth at bedtime.     Historical Provider, MD  fluticasone (FLONASE) 50 MCG/ACT nasal spray Place 2 sprays into both nostrils daily.    Historical Provider, MD  HYDROcodone-homatropine (HYCODAN) 5-1.5 MG/5ML syrup Take 5 mLs by mouth every 6 (six) hours as needed for cough. 01/06/16   MCurt Bears MD  lacosamide (  VIMPAT) 200 MG TABS tablet Take 1 tablet (200 mg total) by mouth 2 (two) times daily. 09/29/15   Melvenia Beam, MD  levETIRAcetam (KEPPRA) 750 MG tablet Take 2 tablets (1,500 mg total) by mouth 2 (two) times daily. 08/25/15   Theodis Blaze, MD  LORazepam (ATIVAN) 1 MG tablet Take 2 tablets by mouth at bedtime. 11/12/12   Historical Provider, MD  Maltodextrin-Xanthan Gum (RESOURCE THICKENUP CLEAR) POWD Take 120 g by mouth as needed. 07/05/15   Robbie Lis, MD  omeprazole (PRILOSEC) 20 MG capsule Take 20 mg by mouth 2 (two) times daily.     Historical Provider, MD  prochlorperazine (COMPAZINE) 10 MG tablet Take 10 mg by mouth every 6 (six) hours as needed for nausea or vomiting.    Historical Provider, MD  risperiDONE (RISPERDAL) 1 MG tablet Take 1 tablet (1 mg total) by mouth at bedtime. 08/25/15   Theodis Blaze, MD  traZODone (DESYREL) 50 MG tablet Take 0.5 tablets (25 mg total) by mouth at bedtime. 07/05/15   Robbie Lis, MD   BP 147/117 mmHg  Pulse 106  Temp(Src) 97.5 F (36.4 C) (Axillary)  Resp 19  SpO2 100% Physical Exam  Constitutional: He appears well-developed and well-nourished. No distress.  HENT:  Head: Atraumatic.  Mouth/Throat: Oropharynx is clear and moist.  Eyes: Conjunctivae are normal. Pupils are equal, round, and reactive to light. No scleral icterus.  Neck: Neck supple. No tracheal deviation present.  No stiffness or rigidity  Cardiovascular: Normal rate, regular rhythm, normal heart sounds and intact distal pulses.   No murmur heard. Pulmonary/Chest: Effort normal and breath sounds normal. No accessory muscle usage. No respiratory distress.  Abdominal: Soft. Bowel sounds are normal. He exhibits no distension. There is no tenderness.  Genitourinary:  No cva tenderness  Musculoskeletal: Normal range of motion.  CTLS spine, non tender, aligned, no step off.  good rom bil ext without pain or focal bony tenderness.   Neurological: He is alert.  Alert appearing, obtunded, does not verbalize. Moves bil ext purposefully w good strength, does not consistently follow commands.   Skin: Skin is warm and dry. No rash noted. He is not diaphoretic.  Psychiatric: He has a normal mood and affect.  Nursing note and vitals reviewed.   ED Course  Procedures (including critical care time) Labs Review   Results for orders placed or performed during the hospital encounter of 78/29/56  Basic metabolic panel  Result Value Ref Range   Sodium 141 135 - 145 mmol/L   Potassium 3.3 (L) 3.5 - 5.1 mmol/L   Chloride  106 101 - 111 mmol/L   CO2 26 22 - 32 mmol/L   Glucose, Bld 85 65 - 99 mg/dL   BUN 22 (H) 6 - 20 mg/dL   Creatinine, Ser 1.33 (H) 0.61 - 1.24 mg/dL   Calcium 9.3 8.9 - 10.3 mg/dL   GFR calc non Af Amer 54 (L) >60 mL/min   GFR calc Af Amer >60 >60 mL/min   Anion gap 9 5 - 15  CBC  Result Value Ref Range   WBC 15.1 (H) 4.0 - 10.5 K/uL   RBC 5.02 4.22 - 5.81 MIL/uL   Hemoglobin 15.0 13.0 - 17.0 g/dL   HCT 44.1 39.0 - 52.0 %   MCV 87.8 78.0 - 100.0 fL   MCH 29.9 26.0 - 34.0 pg   MCHC 34.0 30.0 - 36.0 g/dL   RDW 13.5 11.5 - 15.5 %   Platelets 171  150 - 400 K/uL  Urinalysis, Routine w reflex microscopic (not at [facility])  Result Value Ref Range   Color, Urine YELLOW YELLOW   APPearance CLOUDY (A) CLEAR   Specific Gravity, Urine 1.020 1.005 - 1.030   pH 5.0 5.0 - 8.0   Glucose, UA NEGATIVE NEGATIVE mg/dL   Hgb urine dipstick NEGATIVE NEGATIVE   Bilirubin Urine NEGATIVE NEGATIVE   Ketones, ur 15 (A) NEGATIVE mg/dL   Protein, ur NEGATIVE NEGATIVE mg/dL   Nitrite NEGATIVE NEGATIVE   Leukocytes, UA NEGATIVE NEGATIVE   Ct Head Wo Contrast  01/04/2016  CLINICAL DATA:  65 year old male status post seizure at group home today. Not responding to questions. Initial encounter. Non-small cell lung cancer. EXAM: CT HEAD WITHOUT CONTRAST TECHNIQUE: Contiguous axial images were obtained from the base of the skull through the vertex without intravenous contrast. COMPARISON:  Head CT 06/27/2015 and earlier. FINDINGS: Move Visualized paranasal sinuses and mastoids are clear. No acute osseous abnormality identified. Negative orbit and scalp soft tissues. The cerebral volume is stable. No ventriculomegaly. Stable and normal for age gray-white matter differentiation throughout the brain. No cortically based acute infarct identified. No midline shift, mass effect, or evidence of intracranial mass lesion. No acute intracranial hemorrhage identified. No suspicious intracranial vascular hyperdensity. IMPRESSION:  Stable non contrast CT appearance of the brain. Note that early Metastatic disease to the brain cannot be excluded in the absence of intravenous contrast. Electronically Signed   By: Genevie Ann M.D.   On: 01/04/2016 10:14   Ct Head W Wo Contrast  01/25/2016  CLINICAL DATA:  Found unconscious this morning. Possible seizure. History of seizures and lung cancer. EXAM: CT HEAD WITHOUT AND WITH CONTRAST TECHNIQUE: Contiguous axial images were obtained from the base of the skull through the vertex without and with intravenous contrast CONTRAST:  39m ISOVUE-300 IOPAMIDOL (ISOVUE-300) INJECTION 61% COMPARISON:  Noncontrast head CT 01/04/2016.  Brain MRI 04/17/2015. FINDINGS: There is no evidence of acute cortical infarct, intracranial hemorrhage, mass, midline shift, or extra-axial fluid collection. Mild cerebral atrophy is unchanged. A cavum septum pellucidum et vergae is again noted. There is no evidence of brain edema. No abnormal enhancement is identified within limitations of mild motion artifact. Prior left cataract extraction is again noted. The paranasal sinuses and mastoid air cells are clear. No destructive osseous lesion is seen. IMPRESSION: No evidence of acute intracranial abnormality or intracranial metastatic disease. Electronically Signed   By: ALogan BoresM.D.   On: 01/25/2016 15:58   Ct Chest W Contrast  01/25/2016  CLINICAL DATA:  Found unconscious this morning. History of seizures. History of lung cancer status post radiation therapy. EXAM: CT CHEST WITH CONTRAST TECHNIQUE: Multidetector CT imaging of the chest was performed during intravenous contrast administration. CONTRAST:  716mISOVUE-300 IOPAMIDOL (ISOVUE-300) INJECTION 61% COMPARISON:  10/18/2015 FINDINGS: Mediastinum/Lymph Nodes: Asymmetric left-sided gynecomastia is similar to the prior study. No supraclavicular, axillary, mediastinal, or right hilar lymph node enlargement is identified. Small pericardial effusion has not significantly  changed. Aorta is normal in caliber. No evidence of central pulmonary embolism on this nondedicated study. Mild distention of the esophagus is again seen with a small amount of fluid or debris in its midportion which may reflect dysmotility or reflux. Lungs/Pleura: Medial left upper lobe lung mass has increased in size, measuring 7.1 x 4.4 x 6.7 cm (previously 6.1 x 3.7 x 5.6 cm). Extension superiorly in the posterior left upper lobe along the pleura measures up to 1.6 cm in thickness, stable to minimally  increased from prior. There also appears to be mildly increased inferior extension of tumor into the left hilum with narrowing of multiple left upper and now lower lobe bronchi. There are new patchy opacities posteriorly in the left lower lobe predominantly in the superior segment. Evaluation of the lung parenchyma is mildly limited by motion artifact. 4 mm nodule along the superior aspect of the right major fissure (series 10, image 60) and 3 mm nodule along the central aspect of the right major fissure (series 10, image 79) are unchanged. Subtle nodular opacities in the inferolateral right upper lobe on the prior study have resolved. There is minimal ground-glass and micronodular opacity posteriorly in the right upper lobe which has increased from prior and is likely infectious/inflammatory. No pleural effusion. Upper abdomen: A 2.1 cm right upper pole renal cyst is partially visualized. There also 2 subcentimeter low-density lesions in the upper pole of the left kidney. Musculoskeletal: No suspicious lytic or blastic osseous lesion is identified. IMPRESSION: Increased size of left upper lobe mass, including involvement of the left hilum with narrowing of multiple upper and lower lobe bronchi. New patchy left lower lobe opacity may reflect postobstructive pneumonitis. Electronically Signed   By: Logan Bores M.D.   On: 01/25/2016 15:52    I have personally reviewed and evaluated these images and lab results as  part of my medical decision-making.   MDM   Iv ns. Seizure precautions.   Reviewed nursing notes and prior charts for additional history.   Labs.   Patient was due to have ct chest w contrast today, will obtain.   Patient persistently confused/obtunded, not responding verbally.  Staff from group home arrives, they indicate after his prior seizures has very prolonged post ictal period, with admission for same.  They indicate at his baseline patient is ambulatory, conversant (although w dysarthric speech), able to attend to ADLs, interact with staff/family, etc.   Given persistent, prolonged post ictal period/altered mental status, will admit.    Recheck, afeb. bp normal. No neck stiffness/rigidity. Spine nt. abd soft nt.  On ct chest, appears progression of disease/lung ca.  Ct head neg for metastatic disease.  Hospitalist service consulted for admission.         Lajean Saver, MD 01/25/16 1700

## 2016-01-25 NOTE — Telephone Encounter (Signed)
I returned call to [facility] and todl him to call back for any concerns.

## 2016-01-25 NOTE — ED Notes (Signed)
Marco [name] notified that pt has had ativan

## 2016-01-25 NOTE — Clinical Social Work Note (Signed)
Clinical Social Work Assessment  Patient Details  Name: Marco [name] MRN: [id number] Date of Birth: 10-01-1950  Date of referral:  01/25/16               Reason for consult:   (Patient is from [place].)                Permission sought to share information with:   (None.) Permission granted to share information::  No  Name::        Agency::     Relationship::     Contact Information:     Housing/Transportation Living arrangements for the past 2 months:   (Per note, pt is from [place] of Information:  Other (Comment Required) (Notes, pt has a book at bedside with important information about him.) Patient Interpreter Needed:  None Criminal Activity/Legal Involvement Pertinent to Current Situation/Hospitalization:  No - Comment as needed Significant Relationships:  None [employer] [phone]) Lives with:  Facility Resident Do you feel safe going back to the place where you live?   (Unable to assess due to pt not being able to communicate.) Need for family participation in patient care:  Yes (Comment) (Pt has guardian.)  Care giving concerns:  There are no care giving concerns known at this time. Patient is from a group home.   Social Worker assessment / plan:  CSW attempted to speak with pt at bedside. However, pt was not communicative. There was no family present. Per note, pt was found on the floor this morning by family. Patient presents to [facility] due to confusion and has a hx of seizures.   Patient had a book at bedside, which consisted of information about him.  Also, the book states that the patient's address is [address] In [place], [place]. The book also states that he pt's nick name is " Scoobie".  Employment status:  Unemployed Forensic scientist:  Medicare PT Recommendations:    Information / Referral to community resources:   (Patient is from facility.)  Patient/Family's Response to care:  Patient was not  communicative.  Patient/Family's Understanding of and Emotional Response to Diagnosis, Current Treatment, and Prognosis:  Patient was not communicative.  Emotional Assessment Appearance:  Disheveled Attitude/Demeanor/Rapport:   (Not communicative.) Affect (typically observed):   (Unable to assess. Patient was not communicative.) Orientation:   (Patient was not communicative. Patient would not respond to CSW.) Alcohol / Substance use:  Not Applicable Psych involvement (Current and /or in the community):  No (Comment)  Discharge Needs  Concerns to be addressed:  Adjustment to Illness Readmission within the last 30 days:  No Current discharge risk:  None Barriers to Discharge:  No Barriers Identified   Bernita Buffy, LCSW 01/25/2016, 6:58 PM

## 2016-01-25 NOTE — ED Notes (Signed)
Attempted to call report

## 2016-01-25 NOTE — Progress Notes (Signed)
Spoke with MRI technician at 2115. Pt restless and unable to remain still for MRI at this time. Will communicate to day shift RN. Will continue to monitor.

## 2016-01-26 ENCOUNTER — Inpatient Hospital Stay (HOSPITAL_COMMUNITY)
Admit: 2016-01-26 | Discharge: 2016-01-26 | Disposition: A | Discharge status: Home / Self Care | Payer: Medicare Other | Attending: Neurology | Admitting: Neurology

## 2016-01-26 DIAGNOSIS — R569 Unspecified convulsions: Secondary | ICD-10-CM | POA: Diagnosis not present

## 2016-01-26 DIAGNOSIS — G40909 Epilepsy, unspecified, not intractable, without status epilepticus: Secondary | ICD-10-CM | POA: Diagnosis not present

## 2016-01-26 DIAGNOSIS — C3490 Malignant neoplasm of unspecified part of unspecified bronchus or lung: Secondary | ICD-10-CM

## 2016-01-26 DIAGNOSIS — G9340 Encephalopathy, unspecified: Secondary | ICD-10-CM | POA: Diagnosis not present

## 2016-01-26 LAB — HEPATIC FUNCTION PANEL
ALBUMIN: 3.8 g/dL (ref 3.5–5.0)
ALT: 18 U/L (ref 17–63)
AST: 39 U/L (ref 15–41)
Alkaline Phosphatase: 61 U/L (ref 38–126)
Bilirubin, Direct: 0.2 mg/dL (ref 0.1–0.5)
Indirect Bilirubin: 1.1 mg/dL — ABNORMAL HIGH (ref 0.3–0.9)
Total Bilirubin: 1.3 mg/dL — ABNORMAL HIGH (ref 0.3–1.2)
Total Protein: 6.8 g/dL (ref 6.5–8.1)

## 2016-01-26 LAB — AMMONIA: AMMONIA: 28 umol/L (ref 9–35)

## 2016-01-26 LAB — VALPROIC ACID LEVEL: VALPROIC ACID LVL: 60 ug/mL (ref 50.0–100.0)

## 2016-01-26 MED ORDER — LEVETIRACETAM 750 MG PO TABS
1500.0000 mg | ORAL_TABLET | Freq: Two times a day (BID) | ORAL | Status: DC
  Filled 2016-01-26 (×2): qty 2

## 2016-01-26 MED ORDER — RISPERIDONE 1 MG PO TABS
1.0000 mg | ORAL_TABLET | Freq: Every day | ORAL | Status: DC
  Administered 2016-01-29: 1 mg via ORAL
  Filled 2016-01-26 (×5): qty 1

## 2016-01-26 MED ORDER — LORAZEPAM 2 MG/ML IJ SOLN
0.5000 mg | Freq: Once | INTRAMUSCULAR | Status: AC
  Administered 2016-01-26: 0.5 mg via INTRAVENOUS
  Filled 2016-01-26: qty 1

## 2016-01-26 MED ORDER — SODIUM CHLORIDE 0.9 % IV SOLN
1500.0000 mg | Freq: Two times a day (BID) | INTRAVENOUS | Status: DC
  Administered 2016-01-26 – 2016-01-29 (×6): 1500 mg via INTRAVENOUS
  Filled 2016-01-26 (×7): qty 15

## 2016-01-26 MED ORDER — VALPROATE SODIUM 500 MG/5ML IV SOLN
500.0000 mg | Freq: Two times a day (BID) | INTRAVENOUS | Status: DC
  Administered 2016-01-26 – 2016-01-29 (×7): 500 mg via INTRAVENOUS
  Filled 2016-01-26 (×8): qty 5

## 2016-01-26 MED ORDER — HALOPERIDOL LACTATE 5 MG/ML IJ SOLN
1.0000 mg | Freq: Four times a day (QID) | INTRAMUSCULAR | Status: DC | PRN
  Administered 2016-01-26 – 2016-01-29 (×5): 1 mg via INTRAVENOUS
  Filled 2016-01-26 (×6): qty 1

## 2016-01-26 MED ORDER — LACOSAMIDE 50 MG PO TABS: 200.0000 mg | ORAL_TABLET | Freq: Two times a day (BID) | ORAL | Status: DC

## 2016-01-26 MED ORDER — SODIUM CHLORIDE 0.9 % IV SOLN
200.0000 mg | Freq: Two times a day (BID) | INTRAVENOUS | Status: DC
  Administered 2016-01-26 – 2016-01-29 (×7): 200 mg via INTRAVENOUS
  Filled 2016-01-26 (×8): qty 20

## 2016-01-26 NOTE — Progress Notes (Signed)
EEG Completed; Results Pending  

## 2016-01-26 NOTE — Evaluation (Signed)
SLP Cancellation Note  Patient Details Name: Marco [name] MRN: [id number] DOB: 1951-08-13   Cancelled treatment:       Reason Eval/Treat Not Completed: Other (comment);Fatigue/lethargy limiting ability to participate (RN reports pt currently is not appropriate for evaluation or po, will reattempt at later time)  Luanna Salk, [facility] SLP [phone]

## 2016-01-26 NOTE — Consult Note (Signed)
NEURO HOSPITALIST CONSULT NOTE   Requestig physician: Dr. Coralyn Pear   Reason for Consult: AMS     HPI:                                                                                                                                          Marco [name] is an 65 y.o. 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 He was found confused at his group home.  currently he is in bed on the 5th floor at [facility]. He is alert but answers no question. He tracts my movements and will follow visual commands. He is showing no clinical seizure activity. Per care taker this is typical of his post ictal state and last for a couple days. As he comes aroud he usually becomes very active and agitated. As he is starting to become.   Started having seizures 2 years ago. His initial seizures were TC but lately just staring.  His seizure yesterday was blank staring not responsive, just sitting, not following commands.   Regular level is bathing, eat, make coffee, have simple conversation, able to talk about his sister.     Past Medical History  Diagnosis Date  . Mental disorder     sczizophrenia;moderate retardation  . Depression   . DEMENTIA   . Hypertension     no medications, no documented history per caregiver at preadmission  . GERD (gastroesophageal reflux disease)   . Seizures ([place])   . Developmental disability     developmentaly delayed  . Hx of radiation therapy 11/07/13-12/24/13    lung,60Gy/67f  . Mental retardation   . [place]   . [place]   . Encounter for antineoplastic immunotherapy 04/05/2015    Past Surgical History  Procedure Laterality Date  . Cataract extraction w/phaco  07/12/2011    Procedure: CATARACT EXTRACTION PHACO AND INTRAOCULAR LENS PLACEMENT (IOC);  Surgeon: GAdonis Brook  [facility];  Service:  Ophthalmology;  Laterality: Left;  .Marland KitchenEye surgery      L eye  . Video bronchoscopy Bilateral 10/01/2013    Procedure: VIDEO BRONCHOSCOPY WITHOUT FLUORO;  Surgeon: RCollene Gobble MD;  [place] [facility]  Service: Cardiopulmonary;  Laterality: Bilateral;    Family History  Problem Relation Age of Onset  . Hypertension Mother   . Hypertension Father      Social History:  reports that he quit smoking about 2 years ago. His smoking use included Cigarettes. He has a 17.5 pack-year smoking history. He does not have any smokeless tobacco history on file. He reports that he does not drink alcohol or use illicit drugs.  No Known Allergies  MEDICATIONS:  Prior to Admission:  Prescriptions prior to admission  Medication Sig Dispense Refill Last Dose  . acetaminophen (TYLENOL) 500 MG tablet Take 500 mg by mouth every 4 (four) hours as needed for headache.   Past Month at Unknown time  . bisacodyl (DULCOLAX) 5 MG EC tablet Take 5 mg by mouth daily as needed for moderate constipation (constipation).    unknown at unknown  . diazepam (DIASTAT ACUDIAL) 10 MG GEL Place 5 mg rectally once. Can repeat in 4-6 hours once. (Patient taking differently: Place 5 mg rectally as needed for seizure. Can repeat in 4-6 hours once.) 1 Package 5 unknown at unknown  . donepezil (ARICEPT) 10 MG tablet Take 10 mg by mouth at bedtime.    01/24/2016 at 1900  . fluticasone (FLONASE) 50 MCG/ACT nasal spray Place 2 sprays into both nostrils daily.   01/25/2016 at 700  . HYDROcodone-homatropine (HYCODAN) 5-1.5 MG/5ML syrup Take 5 mLs by mouth every 6 (six) hours as needed for cough. 120 mL 0 Past Week at Unknown time  . lacosamide (VIMPAT) 200 MG TABS tablet Take 1 tablet (200 mg total) by mouth 2 (two) times daily. 60 tablet 11 01/25/2016 at 700  . levETIRAcetam (KEPPRA) 750 MG tablet Take 2 tablets (1,500 mg  total) by mouth 2 (two) times daily. 120 tablet 0 01/25/2016 at 700  . LORazepam (ATIVAN) 1 MG tablet Take 2 tablets by mouth at bedtime.   01/24/2016 at 1900  . omeprazole (PRILOSEC) 20 MG capsule Take 20 mg by mouth 2 (two) times daily.   01/25/2016 at 700  . polyethylene glycol (MIRALAX / GLYCOLAX) packet Take 17 g by mouth daily as needed for mild constipation or moderate constipation.   unknown at unknown  . prochlorperazine (COMPAZINE) 10 MG tablet Take 10 mg by mouth every 6 (six) hours as needed for nausea or vomiting.   unknown at unknown  . risperiDONE (RISPERDAL) 1 MG tablet Take 1 tablet (1 mg total) by mouth at bedtime. 30 tablet 0 01/24/2016 at 1900  . traZODone (DESYREL) 50 MG tablet Take 0.5 tablets (25 mg total) by mouth at bedtime. 30 tablet 0 01/24/2016 at 1900  . Maltodextrin-Xanthan Gum (RESOURCE THICKENUP CLEAR) POWD Take 120 g by mouth as needed. (Patient not taking: Reported on 01/25/2016) 1 Can 0 Completed Course at Unknown time   Scheduled: . antiseptic oral rinse  7 mL Mouth Rinse q12n4p  . chlorhexidine  15 mL Mouth Rinse BID  . lacosamide (VIMPAT) IV  200 mg Intravenous Q12H  . levETIRAcetam  1,500 mg Intravenous Q12H  . risperiDONE  1 mg Oral QHS  . valproate sodium  500 mg Intravenous Q12H     ROS:                                                                                                                                       History obtained from POA  General ROS: negative for - chills, fatigue, fever, night sweats, weight gain or weight loss Psychological ROS: negative for - behavioral disorder, hallucinations, memory difficulties, mood swings or suicidal ideation Ophthalmic ROS: negative for - blurry vision, double vision, eye pain or loss of vision ENT ROS: negative for - epistaxis, nasal discharge, oral lesions, sore throat, tinnitus or vertigo Allergy and Immunology ROS: negative for - hives or itchy/watery eyes Hematological and Lymphatic ROS: negative  for - bleeding problems, bruising or swollen lymph nodes Endocrine ROS: negative for - galactorrhea, hair pattern changes, polydipsia/polyuria or temperature intolerance Respiratory ROS: negative for - cough, hemoptysis, shortness of breath or wheezing Cardiovascular ROS: negative for - chest pain, dyspnea on exertion, edema or irregular heartbeat Gastrointestinal ROS: negative for - abdominal pain, diarrhea, hematemesis, nausea/vomiting or stool incontinence Genito-Urinary ROS: negative for - dysuria, hematuria, incontinence or urinary frequency/urgency Musculoskeletal ROS: negative for - joint swelling or muscular weakness Neurological ROS: as noted in HPI Dermatological ROS: negative for rash and skin lesion changes   Blood pressure 111/73, pulse 122, temperature 97.2 F (36.2 C), temperature source Axillary, resp. rate 19, height 6' (1.829 m), weight 55.8 kg (123 lb 0.3 oz), SpO2 100 %.   Neurologic Examination:                                                                                                      HEENT-  Normocephalic, no lesions, without obvious abnormality.  Normal external eye and conjunctiva.  Normal TM's bilaterally.  Normal auditory canals and external ears. Normal external nose, mucus membranes and septum.  Normal pharynx. Cardiovascular- S1, S2 normal, pulses palpable throughout   Lungs- chest clear, no wheezing, rales, normal symmetric air entry, Heart exam - S1, S2 normal, no murmur, no gallop, rate regular Abdomen- normal findings: bowel sounds normal Extremities- no edema Lymph-no adenopathy palpable Musculoskeletal-no joint tenderness, deformity or swelling Skin-warm and dry, no hyperpigmentation, vitiligo, or suspicious lesions  Neurological Examination Mental Status: Alert, able to follow simple visual commands such as raising his arms and legs. Attempts to count fingers but just sounds like moaning.  Cranial Nerves: II: blinks to threat bilaterally,  pupils equal, round, reactive to light and accommodation III,IV, VI: ptosis not present, extra-ocular motions intact bilaterally V,VII: face symmetric, facial light touch sensation normal bilaterally VIII: hearing normal bilaterally IX,X: unable to visualize XI: bilateral shoulder shrug XII: midline tongue extension Motor: Moving all extremities antigravity Sensory: withdraws from pain bilaterally Deep Tendon Reflexes: 2+ and symmetric throughout UE no KJ or AJ Plantars: Right: downgoing   Left: downgoing Cerebellar: Unable to assess Gait: unable to assess      Lab Results: Basic Metabolic Panel:  Recent Labs Lab 01/25/16 0955  NA 141  K 3.3*  CL 106  CO2 26  GLUCOSE 85  BUN 22*  CREATININE 1.33*  CALCIUM 9.3    Liver Function Tests: No results for input(s): AST, ALT, ALKPHOS, BILITOT, PROT, ALBUMIN in the last 168 hours. No results for input(s): LIPASE, AMYLASE in the last 168 hours. No results for input(s): AMMONIA in the last  168 hours.  CBC:  Recent Labs Lab 01/25/16 0955  WBC 15.1*  HGB 15.0  HCT 44.1  MCV 87.8  PLT 171    Cardiac Enzymes: No results for input(s): CKTOTAL, CKMB, CKMBINDEX, TROPONINI in the last 168 hours.  Lipid Panel: No results for input(s): CHOL, TRIG, HDL, CHOLHDL, VLDL, LDLCALC in the last 168 hours.  CBG: No results for input(s): GLUCAP in the last 168 hours.  Microbiology: Results for orders placed or performed during the hospital encounter of 01/25/16  MRSA PCR Screening     Status: None   Collection Time: 01/25/16  8:27 PM  Result Value Ref Range Status   MRSA by PCR NEGATIVE NEGATIVE Final    Comment:        The GeneXpert MRSA Assay (FDA approved for NASAL specimens only), is one component of a comprehensive MRSA colonization surveillance program. It is not intended to diagnose MRSA infection nor to guide or monitor treatment for MRSA infections.     Coagulation Studies: No results for input(s): LABPROT,  INR in the last 72 hours.  Imaging: Ct Head W Wo Contrast  01/25/2016  CLINICAL DATA:  Found unconscious this morning. Possible seizure. History of seizures and lung cancer. EXAM: CT HEAD WITHOUT AND WITH CONTRAST TECHNIQUE: Contiguous axial images were obtained from the base of the skull through the vertex without and with intravenous contrast CONTRAST:  31m ISOVUE-300 IOPAMIDOL (ISOVUE-300) INJECTION 61% COMPARISON:  Noncontrast head CT 01/04/2016.  Brain MRI 04/17/2015. FINDINGS: There is no evidence of acute cortical infarct, intracranial hemorrhage, mass, midline shift, or extra-axial fluid collection. Mild cerebral atrophy is unchanged. A cavum septum pellucidum et vergae is again noted. There is no evidence of brain edema. No abnormal enhancement is identified within limitations of mild motion artifact. Prior left cataract extraction is again noted. The paranasal sinuses and mastoid air cells are clear. No destructive osseous lesion is seen. IMPRESSION: No evidence of acute intracranial abnormality or intracranial metastatic disease. Electronically Signed   By: ALogan BoresM.D.   On: 01/25/2016 15:58   Ct Chest W Contrast  01/25/2016  CLINICAL DATA:  Found unconscious this morning. History of seizures. History of lung cancer status post radiation therapy. EXAM: CT CHEST WITH CONTRAST TECHNIQUE: Multidetector CT imaging of the chest was performed during intravenous contrast administration. CONTRAST:  7107mISOVUE-300 IOPAMIDOL (ISOVUE-300) INJECTION 61% COMPARISON:  10/18/2015 FINDINGS: Mediastinum/Lymph Nodes: Asymmetric left-sided gynecomastia is similar to the prior study. No supraclavicular, axillary, mediastinal, or right hilar lymph node enlargement is identified. Small pericardial effusion has not significantly changed. Aorta is normal in caliber. No evidence of central pulmonary embolism on this nondedicated study. Mild distention of the esophagus is again seen with a small amount of fluid or  debris in its midportion which may reflect dysmotility or reflux. Lungs/Pleura: Medial left upper lobe lung mass has increased in size, measuring 7.1 x 4.4 x 6.7 cm (previously 6.1 x 3.7 x 5.6 cm). Extension superiorly in the posterior left upper lobe along the pleura measures up to 1.6 cm in thickness, stable to minimally increased from prior. There also appears to be mildly increased inferior extension of tumor into the left hilum with narrowing of multiple left upper and now lower lobe bronchi. There are new patchy opacities posteriorly in the left lower lobe predominantly in the superior segment. Evaluation of the lung parenchyma is mildly limited by motion artifact. 4 mm nodule along the superior aspect of the right major fissure (series 10, image 60) and 3  mm nodule along the central aspect of the right major fissure (series 10, image 79) are unchanged. Subtle nodular opacities in the inferolateral right upper lobe on the prior study have resolved. There is minimal ground-glass and micronodular opacity posteriorly in the right upper lobe which has increased from prior and is likely infectious/inflammatory. No pleural effusion. Upper abdomen: A 2.1 cm right upper pole renal cyst is partially visualized. There also 2 subcentimeter low-density lesions in the upper pole of the left kidney. Musculoskeletal: No suspicious lytic or blastic osseous lesion is identified. IMPRESSION: Increased size of left upper lobe mass, including involvement of the left hilum with narrowing of multiple upper and lower lobe bronchi. New patchy left lower lobe opacity may reflect postobstructive pneumonitis. Electronically Signed   By: Logan Bores M.D.   On: 01/25/2016 15:52   Mr Attempted Daymon Larsen Report  01/26/2016  This examination belongs to an outside facility and is stored here for comparison purposes only.  Contact the originating outside institution for any associated report or interpretation.      Assessment and plan  per attending neurologist  Etta Quill PA-C [facility] [phone]  01/26/2016, 8:38 AM   Assessment/Plan:  65 YO male with known seizure disorder who was noted to have blank staring at group home very typical of his recent seizures. Currently he is able to follow simple commands. Per POA his post-ictal phases usually last 2-3 days and as he is improving he becomes agitated. Suspect he is likely post ictal at this time but will order a EEG to confirm no NCSE.   Recommend: 1) Add Depakote to his home AED regime 500 mg BID 2) Continue Keppra and vimpat at home dose 3) EEG  Roland Rack, MD [facility] [phone]  If 7pm- 7am, please page neurology on call as listed in [place].

## 2016-01-26 NOTE — Evaluation (Signed)
SLP Cancellation Note  Patient Details Name: Marco [name] MRN: [id number] DOB: Feb 25, 1951   Cancelled treatment:       Reason Eval/Treat Not Completed: Other (comment); pt currently getting EEG, SLP to reattempt at later time.   Luanna Salk, [place] [facility] SLP [phone]

## 2016-01-26 NOTE — Progress Notes (Signed)
Initial Nutrition Assessment  DOCUMENTATION CODES:   Severe malnutrition in context of chronic illness  INTERVENTION:  -Ensure Enlive po TID, each supplement provides 350 kcal and 20 grams of protein -RD to continue to monitor  NUTRITION DIAGNOSIS:   Malnutrition related to chronic illness as evidenced by severe depletion of body fat, severe depletion of muscle mass.  GOAL:   Patient will meet greater than or equal to 90% of their needs  MONITOR:   PO intake, I & O's, Labs, Supplement acceptance, Weight trends  REASON FOR ASSESSMENT:   Malnutrition Screening Tool    ASSESSMENT:   Marco [name] is a 65 y.o. male with medical history significant of seizure disorder, small cell lung CA (1/15) s/p chemo & radiation currently being observed, dementia, cognitive developmental delays. He was found confused at his group home today. No one saw him have a seizure but noted that this is how he behaves after a seizure. He has not returned to baseline in the ER and therefore is referred for admission. He was also due for a surveillance CT of the chest today which was done in the ER.   Attempted to speak with [name] at bedside but he continues to be confused in addition to sev mental retardation, unable to provide hx. Presented from group home.  Nutrition-Focused physical exam completed. Findings are severe fat depletion, severe muscle depletion, and no edema.   Exhibits wt fluctuations, but overall, demonstrates a 31#/20% severe wt loss over 7 months. He is on chemo/radiation, with an increase in lung mass size -> likely contributes to wt loss and overall malnutrition.  Unsure of usual PO intake. Currently NPO -> will follow for PO intake during stay. Monitor Ensure [employer].  Labs and Medications reviewed: CBG 176; K 3.3; Bun 22, Cr 1.33. EGFR 82; NS @ 164m/hr  Diet Order:  Diet NPO time specified  Skin:  Reviewed, no issues  Last BM:  PTA  Height:   Ht  Readings from Last 1 Encounters:  01/25/16 6' (1.829 m)    Weight:   Wt Readings from Last 1 Encounters:  01/25/16 123 lb 0.3 oz (55.8 kg)    Ideal Body Weight:  80.91 kg  BMI:  Body mass index is 16.68 kg/(m^2).  Estimated Nutritional Needs:   Kcal:  1950-2250 (35-40 cal/kg)  Protein:  75-95 grams (1.3-1.7g/kg)  Fluid:  >/= 1.95L  EDUCATION NEEDS:   No education needs identified at this time  WSatira Anis Vincent Streater, MS, RD LDN Inpatient Clinical Dietitian Pager [phone]

## 2016-01-26 NOTE — Progress Notes (Signed)
Lab tech has attempted on 2 separate occasions to draw blood for this am.  Pt get aggressive and attempts to hit and grab nursing staff in spite of ativan dose. Rescheduling labs for later, midlevel notified.

## 2016-01-26 NOTE — Procedures (Signed)
History: 65 year old male with seizures who is admitted with altered mental status following seizure  Sedation: Ativan 2 mg 3 hours prior to the EEG  Technique: This is a 21 channel routine scalp EEG performed at the bedside with bipolar and monopolar montages arranged in accordance to the international 10/20 system of electrode placement. One channel was dedicated to EKG recording.    Background: The dominance of this EEG asleep. There is some anterior predominant beta which is in excess of what would be typically seen. Brief periods of awakening, there does seem to be a posterior dominant rhythm of 10 Hz seen bilaterally.  Photic stimulation: Physiologic driving is not performed  EEG Abnormalities: Predominantly sleep EEG  Clinical Interpretation: This EEG is recorded in the drowsy and sleep state. Due to lack of a good awake recording, encephalopathy cannot be commented on. There was no seizure or seizure predisposition recorded on this study. Please note that a normal EEG does not preclude the possibility of epilepsy.   Roland Rack, MD [facility] [phone]  If 7pm- 7am, please page neurology on call as listed in [place].

## 2016-01-26 NOTE — Progress Notes (Signed)
LCSW following while patient medically admitted. Patient is from a group home: [place]:  Willaim Sheng [phone] Sister (also involved, but out of town:  [phone]  (Mrs [facility])   LCSW spoke with [facility] to verify placement. Patient will return to [facility] at [place] with family and community caregivers. Patient has known hx of MR and current medical problems. Updated POA regarding patient's current status, move from ICU to [facility]East and will continue to follow.  Will completed FL2 once closer to [place]. Mrs. [name] will be up after her meeting this morning to see patient.  Plan:  Return to [facility] once medically stable. Update FL2 Send packet with patient   Lane Hacker, MSW Clinical Social Work: [employer] [phone]

## 2016-01-26 NOTE — Progress Notes (Signed)
PROGRESS NOTE    Marco [name]  EML:[id number] DOB: 04-Jul-1951 DOA: 01/25/2016 PCP: Leamon Arnt, MD   Brief Narrative: Marco [name] is a pleasant 65 year old gentleman with a past medical history of mental retardation, seizure disorder, having history of squamous cell lung cancer receiving treatment at the cancer center, follows Dr. Julien [name]. He was admitted to the medicine service on 01/25/2016 when he presented with confusion. Group home staff concerned about behavioral changes related to seizure activity. A CT scan of lungs performed on admission revealing increased size of left upper lobe mass including involvement of left hilum and narrowing of multiple upper and lower lobe bronchi. CT scan of brain with and without contrast not show evidence of metastatic disease.   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  Seizure disorder. -Marco [name] having a history of a seizure disorder admitted to the hospital after nursing home staff had reported concerns for having recurrent seizures at home. -At home he takes Keppra 1500 mg by mouth twice a day and Vimpat 200 mg by mouth twice a day. -Initially treated with IV Keppra and Vimpat, will transition to oral -Has not had further seizure activity since admission. -Having history of lung cancer there was concern for recurrent seizures reflecting metastatic disease to brain. Fortunately CT scan of brain with and without contrast not reveal evidence of metastatic disease.  2.  History of non-small cell lung cancer. -He has Been followed at the cancer center by Dr. Julien [name] -He completed concurrent chemoradiation therapy with weekly carboplatin and paclitaxel, receiving 7 cycles, then having a restaging CT scan of chest showed evidence of disease progression. He  was then placed on treatment with immunotherapy with Niivolumab receiving 9 cycles. -He last saw Dr Julien [name] on 10/27/2015. -CT scan of lungs performed on admission showing increased size of left upper lobe mass including involvement of left hilum and narrowing of multiple upper and lower lobe bronchi. -Patient will need close follow-up at the cancer center to discuss further treatment options.  3.  Acute encephalopathy. -There is concern that encephalopathy may have reflected recurrent seizures. CT scan of brain with and without contrast did not reveal acute intracranial abnormality. -Labs revealed another white count of 15,100 which could have been related to seizure activity. Urinalysis was negative, he has been afebrile overnight. Plan to continue monitoring off of antimicrobial therapy. -Will obtain speech pathology consultation prior to advancing diet.  DVT prophylaxis:  SCDs Code Status: Full code Family Communication:  Disposition Plan: Anticipate discharge to group home when medically stable   Subjective: Marco [name] is arousable however cannot provide history. Appears sedated, nursing staff reporting that he was uncooperative for morning lab draws.  Objective: Filed Vitals:   01/26/16 0200 01/26/16 0400 01/26/16 0500 01/26/16 0600  BP: 113/76 107/72 107/74 125/62  Pulse:      Temp:  98 F (36.7 C)    TempSrc:  Oral    Resp: '20 27 23 21  '$ Height:      Weight:      SpO2: 99% 100% 100% 100%    Intake/Output Summary (Last 24 hours) at 01/26/16 0733 Last data filed at 01/26/16 0600  Gross per 24 hour  Intake 1361.67 ml  Output    600 ml  Net 761.67 ml   [employer]  01/25/16 2005  Weight: 55.8 kg (123 lb 0.3 oz)    Examination:  General exam: He appears sedated however arousable, nonverbal, does not follow commands. Appears thin Respiratory system: Coarse respiratory sounds bilaterally, normal respiratory effort Cardiovascular system: S1 & S2 heard, RRR. No JVD,  murmurs, rubs, gallops or clicks. No pedal edema. Gastrointestinal system: Abdomen is nondistended, soft and nontender. No organomegaly or masses felt. Normal bowel sounds heard. Central nervous system: Alert and oriented. No focal neurological deficits. Extremities: Symmetric 5 x 5 power. Skin: No rashes, lesions or ulcers Psychiatry: Judgement and insight appear normal. Mood & affect appropriate.     Data Reviewed: I have personally reviewed following labs and imaging studies  CBC:  Recent Labs Lab 01/25/16 0955  WBC 15.1*  HGB 15.0  HCT 44.1  MCV 87.8  PLT 161   Basic Metabolic Panel:  Recent Labs Lab 01/25/16 0955  NA 141  K 3.3*  CL 106  CO2 26  GLUCOSE 85  BUN 22*  CREATININE 1.33*  CALCIUM 9.3   GFR: Estimated Creatinine Clearance: 42.5 mL/min (by C-G formula based on Cr of 1.33). Liver Function Tests: No results for input(s): AST, ALT, ALKPHOS, BILITOT, PROT, ALBUMIN in the last 168 hours. No results for input(s): LIPASE, AMYLASE in the last 168 hours. No results for input(s): AMMONIA in the last 168 hours. Coagulation Profile: No results for input(s): INR, PROTIME in the last 168 hours. Cardiac Enzymes: No results for input(s): CKTOTAL, CKMB, CKMBINDEX, TROPONINI in the last 168 hours. BNP (last 3 results) No results for input(s): PROBNP in the last 8760 hours. HbA1C: No results for input(s): HGBA1C in the last 72 hours. CBG: No results for input(s): GLUCAP in the last 168 hours. Lipid Profile: No results for input(s): CHOL, HDL, LDLCALC, TRIG, CHOLHDL, LDLDIRECT in the last 72 hours. Thyroid Function Tests: No results for input(s): TSH, T4TOTAL, FREET4, T3FREE, THYROIDAB in the last 72 hours. Anemia Panel: No results for input(s): VITAMINB12, FOLATE, FERRITIN, TIBC, IRON, RETICCTPCT in the last 72 hours. Sepsis Labs: No results for input(s): PROCALCITON, LATICACIDVEN in the last 168 hours.  Recent Results (from the past 240 hour(s))  MRSA PCR  Screening     Status: None   Collection Time: 01/25/16  8:27 PM  Result Value Ref Range Status   MRSA by PCR NEGATIVE NEGATIVE Final    Comment:        The GeneXpert MRSA Assay (FDA approved for NASAL specimens only), is one component of a comprehensive MRSA colonization surveillance program. It is not intended to diagnose MRSA infection nor to guide or monitor treatment for MRSA infections.          Radiology Studies: Ct Head W Wo Contrast  01/25/2016  CLINICAL DATA:  Found unconscious this morning. Possible seizure. History of seizures and lung cancer. EXAM: CT HEAD WITHOUT AND WITH CONTRAST TECHNIQUE: Contiguous axial images were obtained from the base of the skull through the vertex without and with intravenous contrast CONTRAST:  65m ISOVUE-300 IOPAMIDOL (ISOVUE-300) INJECTION 61% COMPARISON:  Noncontrast head CT 01/04/2016.  Brain MRI 04/17/2015. FINDINGS: There is no evidence of acute cortical infarct, intracranial hemorrhage, mass, midline shift, or extra-axial fluid collection. Mild cerebral atrophy is unchanged. A cavum septum pellucidum et vergae is again noted. There is no evidence of brain edema. No abnormal enhancement is identified within limitations of mild motion artifact. Prior left cataract extraction is again noted. The paranasal sinuses and mastoid air cells are clear. No destructive osseous lesion is  seen. IMPRESSION: No evidence of acute intracranial abnormality or intracranial metastatic disease. Electronically Signed   By: Logan Bores M.D.   On: 01/25/2016 15:58   Ct Chest W Contrast  01/25/2016  CLINICAL DATA:  Found unconscious this morning. History of seizures. History of lung cancer status post radiation therapy. EXAM: CT CHEST WITH CONTRAST TECHNIQUE: Multidetector CT imaging of the chest was performed during intravenous contrast administration. CONTRAST:  64m ISOVUE-300 IOPAMIDOL (ISOVUE-300) INJECTION 61% COMPARISON:  10/18/2015 FINDINGS: Mediastinum/Lymph  Nodes: Asymmetric left-sided gynecomastia is similar to the prior study. No supraclavicular, axillary, mediastinal, or right hilar lymph node enlargement is identified. Small pericardial effusion has not significantly changed. Aorta is normal in caliber. No evidence of central pulmonary embolism on this nondedicated study. Mild distention of the esophagus is again seen with a small amount of fluid or debris in its midportion which may reflect dysmotility or reflux. Lungs/Pleura: Medial left upper lobe lung mass has increased in size, measuring 7.1 x 4.4 x 6.7 cm (previously 6.1 x 3.7 x 5.6 cm). Extension superiorly in the posterior left upper lobe along the pleura measures up to 1.6 cm in thickness, stable to minimally increased from prior. There also appears to be mildly increased inferior extension of tumor into the left hilum with narrowing of multiple left upper and now lower lobe bronchi. There are new patchy opacities posteriorly in the left lower lobe predominantly in the superior segment. Evaluation of the lung parenchyma is mildly limited by motion artifact. 4 mm nodule along the superior aspect of the right major fissure (series 10, image 60) and 3 mm nodule along the central aspect of the right major fissure (series 10, image 79) are unchanged. Subtle nodular opacities in the inferolateral right upper lobe on the prior study have resolved. There is minimal ground-glass and micronodular opacity posteriorly in the right upper lobe which has increased from prior and is likely infectious/inflammatory. No pleural effusion. Upper abdomen: A 2.1 cm right upper pole renal cyst is partially visualized. There also 2 subcentimeter low-density lesions in the upper pole of the left kidney. Musculoskeletal: No suspicious lytic or blastic osseous lesion is identified. IMPRESSION: Increased size of left upper lobe mass, including involvement of the left hilum with narrowing of multiple upper and lower lobe bronchi. New  patchy left lower lobe opacity may reflect postobstructive pneumonitis. Electronically Signed   By: ALogan BoresM.D.   On: 01/25/2016 15:52   Mr Attempted SDaymon LarsenReport  01/26/2016  This examination belongs to an outside facility and is stored here for comparison purposes only.  Contact the originating outside institution for any associated report or interpretation.       Scheduled Meds: . antiseptic oral rinse  7 mL Mouth Rinse q12n4p  . chlorhexidine  15 mL Mouth Rinse BID  . lacosamide (VIMPAT) IV  200 mg Intravenous Q12H  . levETIRAcetam  1,500 mg Intravenous Q12H  . valproate sodium  500 mg Intravenous Q8H   Continuous Infusions: . sodium chloride 100 mL/hr at 01/26/16 0600     LOS: 1 day    Time spent: 35 minutes    ZKelvin Cellar MD [facility] Pager [phone] If 7PM-7AM, please contact night-coverage [email or web address] Password [facility]5/24/2017, 7:33 AM

## 2016-01-27 DIAGNOSIS — F05 Delirium due to known physiological condition: Secondary | ICD-10-CM | POA: Insufficient documentation

## 2016-01-27 DIAGNOSIS — R4182 Altered mental status, unspecified: Secondary | ICD-10-CM | POA: Diagnosis not present

## 2016-01-27 DIAGNOSIS — R569 Unspecified convulsions: Secondary | ICD-10-CM | POA: Diagnosis not present

## 2016-01-27 DIAGNOSIS — G40909 Epilepsy, unspecified, not intractable, without status epilepticus: Secondary | ICD-10-CM | POA: Diagnosis not present

## 2016-01-27 DIAGNOSIS — G9340 Encephalopathy, unspecified: Secondary | ICD-10-CM | POA: Diagnosis not present

## 2016-01-27 LAB — CBC
HEMATOCRIT: 43.3 % (ref 39.0–52.0)
Hemoglobin: 14.2 g/dL (ref 13.0–17.0)
MCH: 30 pg (ref 26.0–34.0)
MCHC: 32.8 g/dL (ref 30.0–36.0)
MCV: 91.4 fL (ref 78.0–100.0)
Platelets: 137 10*3/uL — ABNORMAL LOW (ref 150–400)
RBC: 4.74 MIL/uL (ref 4.22–5.81)
RDW: 13.4 % (ref 11.5–15.5)
WBC: 14.2 10*3/uL — AB (ref 4.0–10.5)

## 2016-01-27 LAB — BASIC METABOLIC PANEL
Anion gap: 8 (ref 5–15)
BUN: 15 mg/dL (ref 6–20)
CHLORIDE: 111 mmol/L (ref 101–111)
CO2: 24 mmol/L (ref 22–32)
Calcium: 8.3 mg/dL — ABNORMAL LOW (ref 8.9–10.3)
Creatinine, Ser: 1.04 mg/dL (ref 0.61–1.24)
GFR calc Af Amer: >60 mL/min (ref >60)
GFR calc non Af Amer: >60 mL/min (ref >60)
Glucose, Bld: 92 mg/dL (ref 65–99)
POTASSIUM: 3.8 mmol/L (ref 3.5–5.1)
SODIUM: 143 mmol/L (ref 135–145)

## 2016-01-27 LAB — VALPROIC ACID LEVEL: Valproic Acid Lvl: 60 ug/mL (ref 50.0–100.0)

## 2016-01-27 NOTE — Clinical Documentation Improvement (Signed)
Internal Medicine  Can the diagnosis of Malnutrition be further specified?   Document Severity - Severe(third degree), Moderate (second degree), Mild (first degree)  Other condition  Unable to clinically determine  Document any associated diagnoses/conditions Supporting Information:  Nutritional Management:  Severe malnutrition in context of chronic illness Malnutrition related to chronic illness as evidenced by severe depletion of body fat, severe depletion of muscle mass.  demonstrates a 31#/20% severe wt loss over 7 months. He is on chemo/radiation  BMI 16   Please exercise your independent, professional judgment when responding. A specific answer is not anticipated or expected. Please update your documentation within the medical record to reflect your response to this query. Thank you  Thank You, Okay [phone]

## 2016-01-27 NOTE — Care Management Note (Signed)
Case Management Note  Patient Details  Name: Marco [name] MRN: [id number] Date of Birth: 1951-01-08  Subjective/Objective:          Active seizures          Action/Plan: Date:  Jan 27, 2016 Chart reviewed for concurrent status and case management needs. Will continue to follow patient for changes and needs: Expected discharge date: [id number] Velva Harman, BSN, [place], [place]  Expected Discharge Date:   (unknown)               Expected Discharge Plan:  Home/Self Care  In-House Referral:  NA  Discharge planning Services  CM Consult  Post Acute Care Choice:  NA Choice offered to:  NA  DME Arranged:  N/A DME Agency:  NA  HH Arranged:  NA HH Agency:  NA  Status of Service:  In process, will continue to follow  Medicare Important Message Given:    Date Medicare IM Given:    Medicare IM give by:    Date Additional Medicare IM Given:    Additional Medicare Important Message give by:     If discussed at [place] of Stay Meetings, dates discussed:    Additional Comments:  Leeroy Cha, RN 01/27/2016, 10:48 AM

## 2016-01-27 NOTE — Progress Notes (Addendum)
LCSWA went to patient room for follow up and assesment, patient asleep. Patient was given Ativan. Will attempt follow-up once patient is awake.   Kathrin Greathouse, Latanya Presser, MSW Clinical Social Worker 5E and Psychiatric Service Line [phone]  01/27/2016  2:23 PM

## 2016-01-27 NOTE — Progress Notes (Signed)
Subjective: somnolent and falls asleep once awoken. Received 2 mg Ativan 4 hours ago which is likely contributing to this. No seizure activity over night per nurse. EEG shows no seizure activity  Exam: Filed Vitals:   01/26/16 2132 01/27/16 0535  BP: 138/75 111/71  Pulse:  117  Temp: 98.6 F (37 C) 97.7 F (36.5 C)  Resp: 18 18        Gen: In bed, NAD MS: somnolent and only wakes to loud voice or stimulation.--received 2 mg Ativan at 0430 CN: PERRLA, EOMI, face symmetric Motor: MAE Sensory:withdraws from pain  Pertinent Labs/Diagnostics: VPA  60 Ammonia 28  Etta Quill PA-C [facility] [phone]  Impression: break though seizure.    Recommendations: 1) continue Depakote  500 mg BID 2) Continue Keppra and vimpat at home dose    01/27/2016, 8:45 AM

## 2016-01-27 NOTE — Evaluation (Addendum)
Clinical/Bedside Swallow Evaluation Patient Details  Name: Marco [name] MRN: [id number] Date of Birth: [birth year]  Today's Date: 01/27/2016 Time: SLP Start Time (ACUTE ONLY): 3382 SLP Stop Time (ACUTE ONLY): 1445 SLP Time Calculation (min) (ACUTE ONLY): 30 min  Past Medical History:  Past Medical History  Diagnosis Date  . Mental disorder     sczizophrenia;moderate retardation  . Depression   . DEMENTIA   . Hypertension     no medications, no documented history per caregiver at preadmission  . GERD (gastroesophageal reflux disease)   . Seizures ([place])   . Developmental disability     developmentaly delayed  . Hx of radiation therapy 11/07/13-12/24/13    lung,60Gy/53f  . Mental retardation   . Seizure disorder ([place]   . Lung cancer ([place]   . Encounter for antineoplastic immunotherapy 04/05/2015   Past Surgical History:  Past Surgical History  Procedure Laterality Date  . Cataract extraction w/phaco  07/12/2011    Procedure: CATARACT EXTRACTION PHACO AND INTRAOCULAR LENS PLACEMENT (IOC);  Surgeon: GAdonis Brook  [facility];  Service: Ophthalmology;  Laterality: Left;  .Marland KitchenEye surgery      L eye  . Video bronchoscopy Bilateral 10/01/2013    Procedure: VIDEO BRONCHOSCOPY WITHOUT FLUORO;  Surgeon: RCollene Gobble MD;  [place] [facility]  Service: Cardiopulmonary;  Laterality: Bilateral;   HPI:  65[facility]- diagnosed with acute encephalopathy.  PMH + for lung cancer diagnosed 12/2013 s/p XRT, MR, seizure d/o, dysphagia.  Swallow evaluation ordered.  Chest imaging 5/23 showed ? LLL pneumonitis due to opacity seen and increased left lobe mass.   Caregiver reports pt on soft/thin diet   Assessment / Plan / Recommendation Clinical Impression  Pt currently present with gross weakness and dysphagia with aspiration of secretions.  Wet congested cough intermittently noted prior to po administration.  Pt was maximally positioned for airway  protection- propped with pillows.  SLP set up oral suction for prn use.  Administered 1/4 tsp of applesauce x2 ONLY = with pt readily opening his mouth to accept more.  Delayed multiple swallows noted concerning for oropharyngeal weakness and residuals.  Overt coughing - delayed- required SLP to orally suction and remove viscous secretions and applesauce.  Pt continued coughing and wet voice x2 before adequate clearance with cued cough/oral suctioning.     Recommend NPO with strict asp precautions.  Educated Tameka (caregiver) to findings, recommendations and demonstrated use of oral suction (only in anterior oral cavity).      Aspiration Risk  Severe aspiration risk;Risk for inadequate nutrition/hydration    Diet Recommendation NPO   Medication Administration: Via alternative means    Other  Recommendations  oral care  Follow up Recommendations    tbd   Frequency and Duration min 1 x/week  1 week       Prognosis Prognosis for Safe Diet Advancement: Guarded Barriers to Reach Goals: Severity of deficits;Time post onset      Swallow Study   General Date of Onset: 01/27/16 H[facility]: 65[facility]- diagnosed with acute encephalopathy.  PMH + for lung cancer diagnosed 12/2013 s/p XRT, MR, seizure d/o, dysphagia.  Swallow evaluation ordered.  Chest imaging 5/23 showed ? LLL pneumonitis due to opacity seen and increased left lobe mass.   Type of Study: Bedside Swallow Evaluation Diet Prior to this Study: NPO Temperature Spikes Noted: No Respiratory Status: Room air History of Recent  Intubation: No Behavior/Cognition: Distractible;Doesn't follow directions;Alert Oral Cavity Assessment: Dry Oral Care Completed by SLP: Yes Oral Cavity - Dentition: Adequate natural dentition Self-Feeding Abilities: Total assist Patient Positioning: Upright in bed Baseline Vocal Quality: Wet Volitional Cough: Weak Volitional Swallow: Unable to elicit     Oral/Motor/Sensory Function Overall Oral Motor/Sensory Function:  (generalized weakness)   Ice Chips Ice chips: Not tested   Thin Liquid Thin Liquid: Not tested    Nectar Thick Nectar Thick Liquid: Not tested   Honey Thick Honey Thick Liquid: Not tested   Puree Puree: Impaired Presentation: Spoon Oral Phase Impairments: Reduced labial seal;Reduced lingual movement/coordination Oral Phase Functional Implications: Prolonged oral transit Pharyngeal Phase Impairments: Suspected delayed Swallow;Cough - Delayed;Multiple swallows Other Comments: 1/4 tsp applesauce -  delayed excessive coughing with SLP orally suctioning viscous yellow tinged secretions and applesauce   Solid   GO   Solid: Not tested       Luanna Salk, [place] [facility] SLP [phone]

## 2016-01-27 NOTE — Progress Notes (Signed)
PROGRESS NOTE    KION HUNTSBERRY  NLG:[id number] DOB: 12/11/1950 DOA: 01/25/2016 PCP: Leamon Arnt, MD   Brief Narrative: Mr. Marco [name] is a pleasant 65 year old gentleman with a past medical history of mental retardation, seizure disorder, having history of squamous cell lung cancer receiving treatment at the cancer center, follows Dr. [name]. He was admitted to the medicine service on 01/25/2016 when he presented with confusion. Group home staff concerned about behavioral changes related to seizure activity. A CT scan of lungs performed on admission revealing increased size of left upper lobe mass including involvement of left hilum and narrowing of multiple upper and lower lobe bronchi. CT scan of brain with and without contrast not show evidence of metastatic disease.   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  Seizure disorder. -Mr. Haverland having a history of a seizure disorder admitted to the hospital after nursing home staff had reported concerns for having recurrent seizures at home. -At home he takes Keppra 1500 mg by mouth twice a day and Vimpat 200 mg by mouth twice a day. -Has not had further seizure activity since admission. -Having history of lung cancer there was concern for recurrent seizures reflecting metastatic disease to brain. Fortunately CT scan of brain with and without contrast not reveal evidence of metastatic disease. -He remains on Keppra 1500 mg IV every 12 hours, Vimpat 200 mg IV every 12 hours and Depakote 500 mg IV every 12 hours. -Difficult to assess neurologic status this morning as he was given 2 mg of IV Ativan earlier for agitation, presently sedated.  2.  History of non-small cell lung cancer. -He has Been followed at the cancer center by Dr. [name] -He  completed concurrent chemoradiation therapy with weekly carboplatin and paclitaxel, receiving 7 cycles, then having a restaging CT scan of chest showed evidence of disease progression. He was then placed on treatment with immunotherapy with Niivolumab receiving 9 cycles. -He last saw Dr [name] on 10/27/2015. -CT scan of lungs performed on admission showing increased size of left upper lobe mass including involvement of left hilum and narrowing of multiple upper and lower lobe bronchi. -Patient will need close follow-up at the cancer center to discuss further treatment options.  3.  Acute encephalopathy. -There is concern that encephalopathy may have reflected recurrent seizures. CT scan of brain with and without contrast did not reveal acute intracranial abnormality. -Labs revealed another white count of 15,100 which could have been related to seizure activity. Urinalysis was negative, he has been afebrile overnight. Plan to continue monitoring off of antimicrobial therapy. -Will obtain speech pathology consultation prior to advancing diet. -Patient was administered IV Ativan earlier, has been heavily sedated over the course of the morning. -I spoke to caregivers at bedside, discussed nonpharmacologic strategies for managing agitation/delirium. Caregivers will be present on evenings to assist with reorientation. Hopefully we can avoid further administration of IV benzodiazepine therapy  DVT prophylaxis:  SCDs Code Status: Full code Family Communication:  Disposition Plan: Anticipate discharge to group home when medically stable   Subjective: Patient is unarousable, heavily sedated from IV Ativan administered earlier  Objective: Filed Vitals:   01/26/16 0800 01/26/16 0847 01/26/16 2132 01/27/16 0535  BP: 111/73 102/72 138/75 111/71  Pulse:  105  117  Temp: 97.2 F (36.2 C) 97.8 F (36.6 C)  98.6 F (37 C) 97.7 F (36.5 C)  TempSrc: Axillary Oral Oral Axillary  Resp: '19 18 18 18    '$ Height:      Weight:      SpO2: 100% 98% 100% 97%    Intake/Output Summary (Last 24 hours) at 01/27/16 1312 Last data filed at 01/27/16 1312  Gross per 24 hour  Intake   2730 ml  Output   1400 ml  Net   1330 ml   Filed Weights   01/25/16 2005  Weight: 55.8 kg (123 lb 0.3 oz)    Examination:  General exam: He is sedated, difficult to arouse Respiratory system: Coarse respiratory sounds bilaterally, normal respiratory effort Cardiovascular system: S1 & S2 heard, RRR. No JVD, murmurs, rubs, gallops or clicks. No pedal edema. Gastrointestinal system: Abdomen is nondistended, soft and nontender. No organomegaly or masses felt. Normal bowel sounds heard. Central nervous system: Patient sedated cannot assess neurologic status Extremities: Symmetric 5 x 5 power. Skin: No rashes, lesions or ulcers Psychiatry: Patient sedated    Data Reviewed: I have personally reviewed following labs and imaging studies  CBC:  Recent Labs Lab 01/25/16 0955 01/27/16 0518  WBC 15.1* 14.2*  HGB 15.0 14.2  HCT 44.1 43.3  MCV 87.8 91.4  PLT 171 270*   Basic Metabolic Panel:  Recent Labs Lab 01/25/16 0955 01/27/16 0518  NA 141 143  K 3.3* 3.8  CL 106 111  CO2 26 24  GLUCOSE 85 92  BUN 22* 15  CREATININE 1.33* 1.04  CALCIUM 9.3 8.3*   GFR: Estimated Creatinine Clearance: 54.4 mL/min (by C-G formula based on Cr of 1.04). Liver Function Tests:  Recent Labs Lab 01/26/16 1405  AST 39  ALT 18  ALKPHOS 61  BILITOT 1.3*  PROT 6.8  ALBUMIN 3.8   No results for input(s): LIPASE, AMYLASE in the last 168 hours.  Recent Labs Lab 01/26/16 1405  AMMONIA 28   Coagulation Profile: No results for input(s): INR, PROTIME in the last 168 hours. Cardiac Enzymes: No results for input(s): CKTOTAL, CKMB, CKMBINDEX, TROPONINI in the last 168 hours. BNP (last 3 results) No results for input(s): PROBNP in the last 8760 hours. HbA1C: No results for input(s): HGBA1C in the last 72  hours. CBG: No results for input(s): GLUCAP in the last 168 hours. Lipid Profile: No results for input(s): CHOL, HDL, LDLCALC, TRIG, CHOLHDL, LDLDIRECT in the last 72 hours. Thyroid Function Tests: No results for input(s): TSH, T4TOTAL, FREET4, T3FREE, THYROIDAB in the last 72 hours. Anemia Panel: No results for input(s): VITAMINB12, FOLATE, FERRITIN, TIBC, IRON, RETICCTPCT in the last 72 hours. Sepsis Labs: No results for input(s): PROCALCITON, LATICACIDVEN in the last 168 hours.  Recent Results (from the past 240 hour(s))  MRSA PCR Screening     Status: None   Collection Time: 01/25/16  8:27 PM  Result Value Ref Range Status   MRSA by PCR NEGATIVE NEGATIVE Final    Comment:        The GeneXpert MRSA Assay (FDA approved for NASAL specimens only), is one component of a comprehensive MRSA colonization surveillance program. It is not intended to diagnose MRSA infection nor to guide or monitor treatment for MRSA infections.          Radiology Studies: Ct Head W Wo Contrast  01/25/2016  CLINICAL DATA:  Found unconscious this morning. Possible seizure. History of seizures and lung cancer. EXAM: CT HEAD WITHOUT AND WITH CONTRAST TECHNIQUE: Contiguous axial images were obtained from the base of  the skull through the vertex without and with intravenous contrast CONTRAST:  76m ISOVUE-300 IOPAMIDOL (ISOVUE-300) INJECTION 61% COMPARISON:  Noncontrast head CT 01/04/2016.  Brain MRI 04/17/2015. FINDINGS: There is no evidence of acute cortical infarct, intracranial hemorrhage, mass, midline shift, or extra-axial fluid collection. Mild cerebral atrophy is unchanged. A cavum septum pellucidum et vergae is again noted. There is no evidence of brain edema. No abnormal enhancement is identified within limitations of mild motion artifact. Prior left cataract extraction is again noted. The paranasal sinuses and mastoid air cells are clear. No destructive osseous lesion is seen. IMPRESSION: No  evidence of acute intracranial abnormality or intracranial metastatic disease. Electronically Signed   By: ALogan BoresM.D.   On: 01/25/2016 15:58   Ct Chest W Contrast  01/25/2016  CLINICAL DATA:  Found unconscious this morning. History of seizures. History of lung cancer status post radiation therapy. EXAM: CT CHEST WITH CONTRAST TECHNIQUE: Multidetector CT imaging of the chest was performed during intravenous contrast administration. CONTRAST:  775mISOVUE-300 IOPAMIDOL (ISOVUE-300) INJECTION 61% COMPARISON:  10/18/2015 FINDINGS: Mediastinum/Lymph Nodes: Asymmetric left-sided gynecomastia is similar to the prior study. No supraclavicular, axillary, mediastinal, or right hilar lymph node enlargement is identified. Small pericardial effusion has not significantly changed. Aorta is normal in caliber. No evidence of central pulmonary embolism on this nondedicated study. Mild distention of the esophagus is again seen with a small amount of fluid or debris in its midportion which may reflect dysmotility or reflux. Lungs/Pleura: Medial left upper lobe lung mass has increased in size, measuring 7.1 x 4.4 x 6.7 cm (previously 6.1 x 3.7 x 5.6 cm). Extension superiorly in the posterior left upper lobe along the pleura measures up to 1.6 cm in thickness, stable to minimally increased from prior. There also appears to be mildly increased inferior extension of tumor into the left hilum with narrowing of multiple left upper and now lower lobe bronchi. There are new patchy opacities posteriorly in the left lower lobe predominantly in the superior segment. Evaluation of the lung parenchyma is mildly limited by motion artifact. 4 mm nodule along the superior aspect of the right major fissure (series 10, image 60) and 3 mm nodule along the central aspect of the right major fissure (series 10, image 79) are unchanged. Subtle nodular opacities in the inferolateral right upper lobe on the prior study have resolved. There is  minimal ground-glass and micronodular opacity posteriorly in the right upper lobe which has increased from prior and is likely infectious/inflammatory. No pleural effusion. Upper abdomen: A 2.1 cm right upper pole renal cyst is partially visualized. There also 2 subcentimeter low-density lesions in the upper pole of the left kidney. Musculoskeletal: No suspicious lytic or blastic osseous lesion is identified. IMPRESSION: Increased size of left upper lobe mass, including involvement of the left hilum with narrowing of multiple upper and lower lobe bronchi. New patchy left lower lobe opacity may reflect postobstructive pneumonitis. Electronically Signed   By: AlLogan Bores.D.   On: 01/25/2016 15:52   Mr Attempted [place]  01/26/2016  This examination belongs to an outside facility and is stored here for comparison purposes only.  Contact the originating outside institution for any associated report or interpretation.       Scheduled Meds: . antiseptic oral rinse  7 mL Mouth Rinse q12n4p  . chlorhexidine  15 mL Mouth Rinse BID  . lacosamide (VIMPAT) IV  200 mg Intravenous Q12H  . levETIRAcetam  1,500 mg Intravenous Q12H  . risperiDONE  1 mg Oral QHS  . valproate sodium  500 mg Intravenous Q12H   Continuous Infusions: . sodium chloride 100 mL/hr at 01/27/16 1018     LOS: 2 days    Time spent: 35 minutes    Kelvin Cellar, MD [facility] Pager [phone]  If 7PM-7AM, please contact night-coverage [email or web address] Password [email or web address] 01/27/2016, 1:12 PM

## 2016-01-28 ENCOUNTER — Inpatient Hospital Stay (HOSPITAL_COMMUNITY): Payer: Medicare Other

## 2016-01-28 DIAGNOSIS — C3491 Malignant neoplasm of unspecified part of right bronchus or lung: Secondary | ICD-10-CM

## 2016-01-28 DIAGNOSIS — G40909 Epilepsy, unspecified, not intractable, without status epilepticus: Secondary | ICD-10-CM | POA: Diagnosis not present

## 2016-01-28 DIAGNOSIS — E43 Unspecified severe protein-calorie malnutrition: Secondary | ICD-10-CM | POA: Diagnosis not present

## 2016-01-28 DIAGNOSIS — R569 Unspecified convulsions: Secondary | ICD-10-CM | POA: Diagnosis not present

## 2016-01-28 DIAGNOSIS — G9340 Encephalopathy, unspecified: Secondary | ICD-10-CM | POA: Diagnosis not present

## 2016-01-28 LAB — BASIC METABOLIC PANEL
Anion gap: 7 (ref 5–15)
BUN: 15 mg/dL (ref 6–20)
CALCIUM: 8.2 mg/dL — AB (ref 8.9–10.3)
CO2: 28 mmol/L (ref 22–32)
CREATININE: 1.02 mg/dL (ref 0.61–1.24)
Chloride: 112 mmol/L — ABNORMAL HIGH (ref 101–111)
GLUCOSE: 89 mg/dL (ref 65–99)
Potassium: 3.7 mmol/L (ref 3.5–5.1)
Sodium: 147 mmol/L — ABNORMAL HIGH (ref 135–145)

## 2016-01-28 LAB — CBC
HCT: 40.8 % (ref 39.0–52.0)
HEMOGLOBIN: 13.3 g/dL (ref 13.0–17.0)
MCH: 30.2 pg (ref 26.0–34.0)
MCHC: 32.6 g/dL (ref 30.0–36.0)
MCV: 92.5 fL (ref 78.0–100.0)
PLATELETS: 124 10*3/uL — AB (ref 150–400)
RBC: 4.41 MIL/uL (ref 4.22–5.81)
RDW: 13.6 % (ref 11.5–15.5)
WBC: 9.7 10*3/uL (ref 4.0–10.5)

## 2016-01-28 NOTE — Progress Notes (Signed)
Speech Language Pathology Treatment: Dysphagia  Patient Details Name: Marco [name] MRN: [id number] DOB: 03/07/1951 Today's Date: 01/28/2016 Time: 0388-8280 SLP Time Calculation (min) (ACUTE ONLY): 10 min  Assessment / Plan / Recommendation Clinical Impression  Pt today continues with difficulties with managing secretions characterized by coughing- RN confirms.   Note white tinged coating on lingua musculature only- pt denies pain with swallowing.     After repositioning pt, provided him with a few single ice chips, nectar boluses and 1/2 tsp applesauce.  Pt with delay in oral transiting and suspect pharyngeal swallow delay.  Poor laryngeal elevation and multiple audible swallows noted across all boluses followed by delayed nonproductive coughing.  Suspect severe pharyngeal weakness, ? CP dysfunction with gross pharyngeal residuals and subsequent aspiration.    Pt is currently aspirating secretions and has h/o chronic dysphagia/aspiration (x at least 5 months coughing with all intake) per SLP conversation with caregiver yesterday.     MBS at this time would change recommendations for this pt as he is overtly aspirating due to gross dysphagia.  As pt is a FULL CODE - Would recommend consider palliaitive consult to establish goals of care for this unfortunate patient.   Reasonable plan for this pt may be comfort feedings if family/legal guardian agreeable.      HPI HPI: 65 yo male adm to [facility] after being found down unconscious- diagnosed with acute encephalopathy.  PMH + for lung cancer diagnosed 12/2013 s/p XRT, MR, seizure d/o, dysphagia.  Swallow evaluation ordered.  Chest imaging 5/23 showed ? LLL pneumonitis due to opacity seen and increased left lobe mass.        SLP Plan        Recommendations  Diet recommendations: NPO Liquids provided via:  (tsp amounts of thin water only as tolerated) Medication Administration: Via alternative means                   [place], [place] [facility] SLP [phone]

## 2016-01-28 NOTE — Progress Notes (Signed)
Haldol '1mg'$  adm at 1930 for increased agitation(kicking/biting, attempting to get oob) No relief noted.  Ativan '2mg'$  adm at [place] secondary to increased anxiety/restlessness, near fall.  Good relief noted, rested quietly rest of shift.  Outside sitter in at 2300

## 2016-01-28 NOTE — Care Management Important Message (Signed)
Important Message  Patient Details  Name: Marco [name] MRN: [id number] Date of Birth: Oct 26, 1950   Medicare Important Message Given:  Yes    Camillo Flaming 01/28/2016, 9:14 AMImportant Message  Patient Details  Name: Marco [name] MRN: [id number] Date of Birth: 1951-06-14   Medicare Important Message Given:  Yes    Camillo Flaming 01/28/2016, 9:14 AM

## 2016-01-28 NOTE — Progress Notes (Signed)
Subjective: More alert today and able to follow some commands.   Exam: Filed Vitals:   01/27/16 2207 01/28/16 0505  BP: 101/52 110/77  Pulse: 105 98  Temp: 98.5 F (36.9 C) 97.8 F (36.6 C)  Resp: 20 19        Gen: In bed, NAD MS: awake, follows no commands but attempts to count my fingers perseverating on "two" along with incomprehensive speech. Blinks to threat bilaterally CN: 2-12 intact Motor: MAEW Sensory:intact   Pertinent Labs/Diagnostics: none  Etta Quill PA-C [facility] [phone]  Impression: break though seizure.    Recommendations: 1) continue Depakote 500 mg BID 2) Continue Keppra and vimpat at home dose 3) will need to transition to PO on these medications when capable.  4) Will S/O at this time please call with questions.    01/28/2016, 8:34 AM

## 2016-01-28 NOTE — Progress Notes (Signed)
Patient cursing and swinging at staff and IV pole.  MD notified.

## 2016-01-28 NOTE — Progress Notes (Signed)
PROGRESS NOTE    Marco [name]  JSE:[id number] DOB: 05-03-1951 DOA: 01/25/2016 PCP: Leamon Arnt, MD   Brief Narrative: Mr. Vantine is a pleasant 65 year old gentleman with a past medical history of mental retardation, seizure disorder, having history of squamous cell lung cancer receiving treatment at the cancer center, follows Dr. [name]. He was admitted to the medicine service on 01/25/2016 when he presented with confusion. Group home staff concerned about behavioral changes related to seizure activity. A CT scan of lungs performed on admission revealing increased size of left upper lobe mass including involvement of left hilum and narrowing of multiple upper and lower lobe bronchi. CT scan of brain with and without contrast not show evidence of metastatic disease.   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  Seizure disorder. -Mr. Dome having a history of a seizure disorder admitted to the hospital after nursing home staff had reported concerns for having recurrent seizures at home. -At home he takes Keppra 1500 mg by mouth twice a day and Vimpat 200 mg by mouth twice a day. -Has not had further seizure activity since admission. -Having history of lung cancer there was concern for recurrent seizures reflecting metastatic disease to brain. Fortunately CT scan of brain with and without contrast not reveal evidence of metastatic disease. -He remains on Keppra 1500 mg IV every 12 hours, Vimpat 200 mg IV every 12 hours and Depakote 500 mg IV every 12 hours. -Case discussed with neurology, did not have further recommendations.   2.  History of non-small cell lung cancer. -He is followed at the cancer  center by Dr. [name] -He completed concurrent chemoradiation therapy with weekly carboplatin and paclitaxel, receiving 7 cycles, then having a restaging CT scan of chest showed evidence of disease progression. He was then placed on treatment with immunotherapy with Niivolumab receiving 9 cycles. -He last saw Dr [name] on 10/27/2015. -CT scan of lungs performed on admission showing increased size of left upper lobe mass including involvement of left hilum and narrowing of multiple upper and lower lobe bronchi. -Will repeat CXR to assess for possible post obstructive PNA -Patient will need close follow-up at the cancer center to discuss further treatment options.  3.  Acute encephalopathy. -There is concern that encephalopathy may have reflected recurrent seizures. CT scan of brain with and without contrast did not reveal acute intracranial abnormality. -Labs showing a downward trend in his white count 9700 from 15,100 on admission.  -I spoke to caregivers at bedside, discussed nonpharmacologic strategies for managing agitation/delirium. Caregivers will be present on evenings to assist with reorientation. Hopefully we can avoid further administration of IV benzodiazepine therapy -Will repeat CXR   DVT prophylaxis:  SCDs Code Status: Full code Family Communication: I spoke to his care giver [name] at [phone] Disposition Plan: Anticipate discharge to group home when medically stable, will remain hospitalized over weekend  Subjective: He remains with significant mental status changes, encephalopathic, not following commands  Objective: Filed Vitals:   01/27/16 0535 01/27/16 1431 01/27/16 2207 01/28/16 0505  BP: 111/71 126/82 101/52 110/77  Pulse: 117 124 105 98  Temp: 97.7 F (36.5 C) 98.7 F (37.1 C) 98.5 F (36.9 C) 97.8 F (36.6 C)  TempSrc:  Axillary Axillary Axillary Axillary  Resp: '18 20 20 19  '$ Height:      Weight:      SpO2: 97% 97% 98% 100%    Intake/Output  Summary (Last 24 hours) at 01/28/16 1346 Last data filed at 01/28/16 5597  Gross per 24 hour  Intake   2810 ml  Output    550 ml  Net   2260 ml   Filed Weights   01/25/16 2005  Weight: 55.8 kg (123 lb 0.3 oz)    Examination:  General exam: He is sedated, difficult to arouse Respiratory system: Coarse respiratory sounds bilaterally, normal respiratory effort Cardiovascular system: S1 & S2 heard, RRR. No JVD, murmurs, rubs, gallops or clicks. No pedal edema. Gastrointestinal system: Abdomen is nondistended, soft and nontender. No organomegaly or masses felt. Normal bowel sounds heard. Central nervous system: Patient sedated cannot assess neurologic status Extremities: Symmetric 5 x 5 power. Skin: No rashes, lesions or ulcers Psychiatry: Patient sedated    Data Reviewed: I have personally reviewed following labs and imaging studies  CBC:  Recent Labs Lab 01/25/16 0955 01/27/16 0518 01/28/16 0518  WBC 15.1* 14.2* 9.7  HGB 15.0 14.2 13.3  HCT 44.1 43.3 40.8  MCV 87.8 91.4 92.5  PLT 171 137* 416*   Basic Metabolic Panel:  Recent Labs Lab 01/25/16 0955 01/27/16 0518 01/28/16 0518  NA 141 143 147*  K 3.3* 3.8 3.7  CL 106 111 112*  CO2 '26 24 28  '$ GLUCOSE 85 92 89  BUN 22* 15 15  CREATININE 1.33* 1.04 1.02  CALCIUM 9.3 8.3* 8.2*   GFR: Estimated Creatinine Clearance: 55.5 mL/min (by C-G formula based on Cr of 1.02). Liver Function Tests:  Recent Labs Lab 01/26/16 1405  AST 39  ALT 18  ALKPHOS 61  BILITOT 1.3*  PROT 6.8  ALBUMIN 3.8   No results for input(s): LIPASE, AMYLASE in the last 168 hours.  Recent Labs Lab 01/26/16 1405  AMMONIA 28   Coagulation Profile: No results for input(s): INR, PROTIME in the last 168 hours. Cardiac Enzymes: No results for input(s): CKTOTAL, CKMB, CKMBINDEX, TROPONINI in the last 168 hours. BNP (last 3 results) No results for input(s): PROBNP in the last 8760 hours. HbA1C: No results for input(s): HGBA1C in the  last 72 hours. CBG: No results for input(s): GLUCAP in the last 168 hours. Lipid Profile: No results for input(s): CHOL, HDL, LDLCALC, TRIG, CHOLHDL, LDLDIRECT in the last 72 hours. Thyroid Function Tests: No results for input(s): TSH, T4TOTAL, FREET4, T3FREE, THYROIDAB in the last 72 hours. Anemia Panel: No results for input(s): VITAMINB12, FOLATE, FERRITIN, TIBC, IRON, RETICCTPCT in the last 72 hours. Sepsis Labs: No results for input(s): PROCALCITON, LATICACIDVEN in the last 168 hours.  Recent Results (from the past 240 hour(s))  MRSA PCR Screening     Status: None   Collection Time: 01/25/16  8:27 PM  Result Value Ref Range Status   MRSA by PCR NEGATIVE NEGATIVE Final    Comment:        The GeneXpert MRSA Assay (FDA approved for NASAL specimens only), is one component of a comprehensive MRSA colonization surveillance program. It is not intended to diagnose MRSA infection nor to guide or monitor treatment for MRSA infections.          Radiology Studies: No results found.      Scheduled Meds: . antiseptic oral rinse  7 mL Mouth Rinse q12n4p  . chlorhexidine  15 mL Mouth Rinse BID  . lacosamide (VIMPAT)  IV  200 mg Intravenous Q12H  . levETIRAcetam  1,500 mg Intravenous Q12H  . risperiDONE  1 mg Oral QHS  . valproate sodium  500 mg Intravenous Q12H   Continuous Infusions: . sodium chloride 100 mL/hr at 01/27/16 1018     LOS: 3 days    Time spent: 30 minutes    Kelvin Cellar, MD [facility] Pager [phone]  If 7PM-7AM, please contact night-coverage [email or web address] Password [email or web address] 01/28/2016, 1:46 PM

## 2016-01-29 DIAGNOSIS — C3490 Malignant neoplasm of unspecified part of unspecified bronchus or lung: Secondary | ICD-10-CM | POA: Diagnosis not present

## 2016-01-29 DIAGNOSIS — G40909 Epilepsy, unspecified, not intractable, without status epilepticus: Secondary | ICD-10-CM | POA: Diagnosis not present

## 2016-01-29 DIAGNOSIS — R569 Unspecified convulsions: Secondary | ICD-10-CM | POA: Diagnosis not present

## 2016-01-29 DIAGNOSIS — E43 Unspecified severe protein-calorie malnutrition: Secondary | ICD-10-CM | POA: Diagnosis not present

## 2016-01-29 LAB — CBC
HCT: 38 % — ABNORMAL LOW (ref 39.0–52.0)
Hemoglobin: 12.3 g/dL — ABNORMAL LOW (ref 13.0–17.0)
MCH: 29.9 pg (ref 26.0–34.0)
MCHC: 32.4 g/dL (ref 30.0–36.0)
MCV: 92.5 fL (ref 78.0–100.0)
PLATELETS: 136 10*3/uL — AB (ref 150–400)
RBC: 4.11 MIL/uL — ABNORMAL LOW (ref 4.22–5.81)
RDW: 13.5 % (ref 11.5–15.5)
WBC: 7.7 10*3/uL (ref 4.0–10.5)

## 2016-01-29 LAB — BASIC METABOLIC PANEL
Anion gap: 9 (ref 5–15)
BUN: 15 mg/dL (ref 6–20)
CALCIUM: 8.1 mg/dL — AB (ref 8.9–10.3)
CHLORIDE: 110 mmol/L (ref 101–111)
CO2: 25 mmol/L (ref 22–32)
CREATININE: 0.78 mg/dL (ref 0.61–1.24)
GFR calc non Af Amer: >60 mL/min (ref >60)
Glucose, Bld: 71 mg/dL (ref 65–99)
Potassium: 3.2 mmol/L — ABNORMAL LOW (ref 3.5–5.1)
SODIUM: 144 mmol/L (ref 135–145)

## 2016-01-29 MED ORDER — VALPROIC ACID 250 MG PO CAPS
500.0000 mg | ORAL_CAPSULE | Freq: Two times a day (BID) | ORAL | Status: DC
  Administered 2016-01-29 – 2016-01-30 (×3): 500 mg via ORAL
  Filled 2016-01-29 (×4): qty 2

## 2016-01-29 MED ORDER — POTASSIUM CHLORIDE CRYS ER 20 MEQ PO TBCR
40.0000 meq | EXTENDED_RELEASE_TABLET | Freq: Once | ORAL | Status: AC
  Administered 2016-01-29: 40 meq via ORAL
  Filled 2016-01-29: qty 2

## 2016-01-29 MED ORDER — LACOSAMIDE 50 MG PO TABS
200.0000 mg | ORAL_TABLET | Freq: Two times a day (BID) | ORAL | Status: DC
  Administered 2016-01-29 – 2016-01-30 (×3): 200 mg via ORAL
  Filled 2016-01-29 (×3): qty 4

## 2016-01-29 MED ORDER — LEVETIRACETAM 750 MG PO TABS
1500.0000 mg | ORAL_TABLET | Freq: Two times a day (BID) | ORAL | Status: DC
Start: 2016-01-29 — End: 2016-01-30
  Administered 2016-01-29 – 2016-01-30 (×3): 1500 mg via ORAL
  Filled 2016-01-29 (×4): qty 2

## 2016-01-29 NOTE — Progress Notes (Signed)
PROGRESS NOTE    THUNDER BRIDGEWATER  WGY:[id number] DOB: 03/11/1951 DOA: 01/25/2016 PCP: Leamon Arnt, MD   Brief Narrative: Marco [name] is a pleasant 65 year old gentleman with a past medical history of mental retardation, seizure disorder, having history of squamous cell lung cancer receiving treatment at the cancer center, follows Marco [name]. He was admitted to the medicine service on 01/25/2016 when he presented with confusion. Group home staff concerned about behavioral changes related to seizure activity. A CT scan of lungs performed on admission revealing increased size of left upper lobe mass including involvement of left hilum and narrowing of multiple upper and lower lobe bronchi. CT scan of brain with and without contrast not show evidence of metastatic disease.   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  Seizure disorder. -Marco [name] having a history of a seizure disorder admitted to the hospital after nursing home staff had reported concerns for having recurrent seizures at home. -At home he takes Keppra 1500 mg by mouth twice a day and Vimpat 200 mg by mouth twice a day. -Has not had further seizure activity since admission. -Having history of lung cancer there was concern for recurrent seizures reflecting metastatic disease to brain. Fortunately CT scan of brain with and without contrast not reveal evidence of metastatic disease. -Will change IV anticonvulsant therapy to by mouth given clinical improvement. Neurology recommended adding valproic acid to his regimen.  2.  History of non-small cell lung cancer. -He is followed at the cancer center by Marco [name] -He completed concurrent  chemoradiation therapy with weekly carboplatin and paclitaxel, receiving 7 cycles, then having a restaging CT scan of chest showed evidence of disease progression. He was then placed on treatment with immunotherapy with Niivolumab receiving 9 cycles. -He last saw Dr Julien [name] on 10/27/2015. -CT scan of lungs performed on admission showing increased size of left upper lobe mass including involvement of left hilum and narrowing of multiple upper and lower lobe bronchi. -Will repeat CXR to assess for possible post obstructive PNA -Patient will need close follow-up at the cancer center to discuss further treatment options.  3.  Acute encephalopathy. -There is concern that encephalopathy may have reflected recurrent seizures. CT scan of brain with and without contrast did not reveal acute intracranial abnormality. -Labs showing a downward trend in his white count 9700 from 15,100 on admission.  -I spoke to caregivers at bedside, discussed nonpharmacologic strategies for managing agitation/delirium. Caregivers will be present on evenings to assist with reorientation. Hopefully we can avoid further administration of IV benzodiazepine therapy -I think that overall he looks much better today, he ambulated down the hallway with a walker. I think IV ativan was likely contributing to delirium and should NOT be administered unless he has another seizure.  -Will continue mobility  DVT prophylaxis:  SCDs Code Status: Full code Family Communication: I spoke to his care giver Marco [name] at [phone] Disposition Plan: Anticipate discharge to his group home in the naxt 24 hours.   Subjective: He looks much better today, ambulated down the hallway.   Objective: Filed Vitals:   01/27/16 2207 01/28/16 0505 01/28/16 1611 01/29/16 0608  BP: 101/52 110/77 119/72 116/73  Pulse: 105 98 102 88  Temp:  98.5 F (36.9 C) 97.8 F (36.6 C) 97.7 F (36.5 C) 98.2 F (36.8 C)  TempSrc: Axillary Axillary Axillary Oral    Resp: '20 19 20 19  '$ Height:      Weight:      SpO2: 98% 100% 96% 99%    Intake/Output Summary (Last 24 hours) at 01/29/16 1341 Last data filed at 01/29/16 1027  Gross per 24 hour  Intake   2815 ml  Output    502 ml  Net   2313 ml   Filed Weights   01/25/16 2005  Weight: 55.8 kg (123 lb 0.3 oz)    Examination:  General exam: He looks much better today, more awake, ambulated down the hallway.  Respiratory system: Coarse respiratory sounds bilaterally, normal respiratory effort Cardiovascular system: S1 & S2 heard, RRR. No JVD, murmurs, rubs, gallops or clicks. No pedal edema. Gastrointestinal system: Abdomen is nondistended, soft and nontender. No organomegaly or masses felt. Normal bowel sounds heard. Central nervous system: Patient sedated cannot assess neurologic status Extremities: Symmetric 5 x 5 power. Skin: No rashes, lesions or ulcers Psychiatry: Patient sedated    Data Reviewed: I have personally reviewed following labs and imaging studies  CBC:  Recent Labs Lab 01/25/16 0955 01/27/16 0518 01/28/16 0518 01/29/16 0551  WBC 15.1* 14.2* 9.7 7.7  HGB 15.0 14.2 13.3 12.3*  HCT 44.1 43.3 40.8 38.0*  MCV 87.8 91.4 92.5 92.5  PLT 171 137* 124* 130*   Basic Metabolic Panel:  Recent Labs Lab 01/25/16 0955 01/27/16 0518 01/28/16 0518 01/29/16 0551  NA 141 143 147* 144  K 3.3* 3.8 3.7 3.2*  CL 106 111 112* 110  CO2 '26 24 28 25  '$ GLUCOSE 85 92 89 71  BUN 22* '15 15 15  '$ CREATININE 1.33* 1.04 1.02 0.78  CALCIUM 9.3 8.3* 8.2* 8.1*   GFR: Estimated Creatinine Clearance: 70.7 mL/min (by C-G formula based on Cr of 0.78). Liver Function Tests:  Recent Labs Lab 01/26/16 1405  AST 39  ALT 18  ALKPHOS 61  BILITOT 1.3*  PROT 6.8  ALBUMIN 3.8   No results for input(s): LIPASE, AMYLASE in the last 168 hours.  Recent Labs Lab 01/26/16 1405  AMMONIA 28   Coagulation Profile: No results for input(s): INR, PROTIME in the last 168 hours. Cardiac  Enzymes: No results for input(s): CKTOTAL, CKMB, CKMBINDEX, TROPONINI in the last 168 hours. BNP (last 3 results) No results for input(s): PROBNP in the last 8760 hours. HbA1C: No results for input(s): HGBA1C in the last 72 hours. CBG: No results for input(s): GLUCAP in the last 168 hours. Lipid Profile: No results for input(s): CHOL, HDL, LDLCALC, TRIG, CHOLHDL, LDLDIRECT in the last 72 hours. Thyroid Function Tests: No results for input(s): TSH, T4TOTAL, FREET4, T3FREE, THYROIDAB in the last 72 hours. Anemia Panel: No results for input(s): VITAMINB12, FOLATE, FERRITIN, TIBC, IRON, RETICCTPCT in the last 72 hours. Sepsis Labs: No results for input(s): PROCALCITON, LATICACIDVEN in the last 168 hours.  Recent Results (from the past 240 hour(s))  MRSA PCR Screening     Status: None   Collection Time: 01/25/16  8:27 PM  Result Value Ref Range Status   MRSA by PCR NEGATIVE NEGATIVE Final    Comment:        The GeneXpert MRSA Assay (FDA approved for NASAL specimens only), is one component of a comprehensive MRSA colonization surveillance program. It is not intended to diagnose MRSA infection nor to guide or monitor treatment for MRSA infections.  Radiology Studies: Dg Chest 1 View  01/28/2016  CLINICAL DATA:  Diagnosed with pneumonia recently. Pt is wheezing. Pt was also half asleep during procedure. Hx of HTN, dementia, mental retardation, and lung cancer. EXAM: CHEST 1 VIEW COMPARISON:  08/22/2015 FINDINGS: Heart size is upper normal. Left upper lobe mass again noted, obscuring the aortic knob. Interval development of left lower lobe opacity which obscures the left hemidiaphragm and is consistent with pneumonia. Right lung is clear. No pulmonary edema. IMPRESSION: 1. Hurts assistant left upper lobe mass. 2. Interval development of left lower lobe infiltrate. Consider aspiration pneumonia. Electronically Signed   By: Nolon Nations M.D.   On: 01/28/2016 15:03         Scheduled Meds: . antiseptic oral rinse  7 mL Mouth Rinse q12n4p  . chlorhexidine  15 mL Mouth Rinse BID  . lacosamide (VIMPAT) IV  200 mg Intravenous Q12H  . levETIRAcetam  1,500 mg Intravenous Q12H  . risperiDONE  1 mg Oral QHS  . valproate sodium  500 mg Intravenous Q12H   Continuous Infusions:     LOS: 4 days    Time spent: 30 minutes    Kelvin Cellar, MD [facility] Pager [phone]  If 7PM-7AM, please contact night-coverage [email or web address] Password [email or web address] 01/29/2016, 1:41 PM

## 2016-01-30 DIAGNOSIS — C3490 Malignant neoplasm of unspecified part of unspecified bronchus or lung: Secondary | ICD-10-CM | POA: Diagnosis not present

## 2016-01-30 MED ORDER — VALPROIC ACID 250 MG PO CAPS: 500.0000 mg | ORAL_CAPSULE | Freq: Two times a day (BID) | ORAL | Status: DC

## 2016-01-30 NOTE — NC FL2 (Signed)
[place] LEVEL OF CARE SCREENING TOOL     IDENTIFICATION  Patient Name: Marco [name] Birthdate: [birth year] Sex: male Admission Date (Current Location): 01/25/2016  [place] and [place] Number:  Marco [name] [id number] [place] and Address:  [facility],  [place] [address] [place]      Provider Number:    Attending Physician Name and Address:  Marco Cellar, MD  Relative Name and Phone Number:       Current Level of Care: Hospital Recommended Level of Care: Other (Comment) ([place]) Prior Approval Number:    Date Approved/Denied:   PASRR Number:    Discharge Plan: Other (Comment) (Return to Group Home)    Current Diagnoses: Patient Active Problem List   Diagnosis Date Noted  . Protein-calorie malnutrition, severe ([place]) 01/28/2016  . Altered mental status   . Post-ictal confusion   . Acute encephalopathy 01/25/2016  . Squamous cell carcinoma of lung ([place]) 01/25/2016  . Seizure disorder ([place]) 01/25/2016  . Seizure ([place]) 08/22/2015  . Malnutrition of moderate degree 07/02/2015  . Sepsis ([place]) 07/01/2015  . UTI (urinary tract infection) 07/01/2015  . ARF (acute renal failure) ([place]) 07/01/2015  . Dementia without behavioral disturbance 07/01/2015  . Leukocytosis 07/01/2015  . Hypokalemia 07/01/2015  . Hypernatremia 07/01/2015  . Convulsions/seizures ([place]) 05/20/2015  . Acute respiratory failure with hypoxia ([place])   . Squamous cell lung cancer ([place]) 09/10/2013  . 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 Has legal guardian) Convulsions/Seizures Continent Diet (Soft diet; nectar thick liquids)  AMBULATORY STATUS COMMUNICATION OF NEEDS Skin   Supervision  Verbally Normal                       Personal Care Assistance Level of Assistance  Bathing, Dressing Bathing Assistance: Limited assistance   Dressing Assistance: Limited assistance     Functional Limitations Info             SPECIAL CARE FACTORS FREQUENCY                       Contractures Contractures Info: Not present    Additional Factors Info  Code Status, Allergies Code Status Info: Full Allergies Info: NKA           Current Medications (01/30/2016):  This is the current hospital active medication list Current Facility-Administered Medications  Medication Dose Route Frequency Provider Last Rate Last Dose  . acetaminophen (TYLENOL) tablet 650 mg  650 mg Oral Q6H PRN Debbe Odea, MD       Or  . acetaminophen (TYLENOL) suppository 650 mg  650 mg Rectal Q6H PRN Debbe Odea, MD      . antiseptic oral rinse (CPC / CETYLPYRIDINIUM CHLORIDE 0.05%) solution 7 mL  7 mL Mouth Rinse q12n4p Debbe Odea, MD   7 mL at 01/29/16 1744  . chlorhexidine (PERIDEX) 0.12 % solution 15 mL  15 mL Mouth Rinse BID Debbe Odea, MD   15 mL at 01/30/16 1111  . haloperidol lactate (HALDOL) injection 1 mg  1 mg Intravenous Q6H PRN Marco Cellar, MD   1 mg at 01/29/16 1059  . lacosamide (VIMPAT) tablet 200 mg  200 mg Oral BID Marco Cellar, MD  200 mg at 01/30/16 1112  . levETIRAcetam (KEPPRA) tablet 1,500 mg  1,500 mg Oral BID Marco Cellar, MD   1,500 mg at 01/30/16 1111  . ondansetron (ZOFRAN) tablet 4 mg  4 mg Oral Q6H PRN Debbe Odea, MD       Or  . ondansetron (ZOFRAN) injection 4 mg  4 mg Intravenous Q6H PRN Debbe Odea, MD   4 mg at 01/26/16 0045  . risperiDONE (RISPERDAL) tablet 1 mg  1 mg Oral QHS Marco Cellar, MD   1 mg at 01/29/16 2300  . valproic acid (DEPAKENE) 250 MG capsule 500 mg  500 mg Oral BID Marco Cellar, MD   500 mg at 01/30/16 1112     Discharge Medications: Please see discharge summary for a list of discharge medications.  Relevant  Imaging Results:  Relevant Lab Results:   Additional Information    Marco [name], [place]

## 2016-01-30 NOTE — Progress Notes (Signed)
Per MD note- anticipate d/c back to group home tomorrow if stable Fl2 initiated and placed in chart. Will require update re: d/c meds, diet etc prior to d/c.  Message left for patient's sister and legal guardian Jorene Minors) [phone] to return call.  Lorie Phenix. Pauline Good, [place]  (weekend coverage)

## 2016-01-30 NOTE — Progress Notes (Signed)
Pt discharged to home. DC instructions given with sister and young male family member at bedside. Prescription x 1 given for valproic acid. No concerns voiced. Pt left unit in wheelchair pushed by nurse tech accompanied by above listed family members. No concerns voiced. VWilliams, rn.

## 2016-01-30 NOTE — Discharge Summary (Signed)
Physician Discharge Summary  Marco [name] JKD:[id number] DOB: 1951/03/26 DOA: 01/25/2016  PCP: Leamon Arnt, MD  Admit date: 01/25/2016 Discharge date: 01/30/2016  Time spent: 35 minutes  Recommendations for Outpatient Follow-up:  1. During this hospitalization he was started on valproic acid 500 mg by mouth twice a day given neurologist concerns for recurrent seizures. 2. He is at a high aspiration risk, please take aspiration precautions 3. He should have short-term follow-up with his primary oncologist Dr. 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 male with medical history significant of seizure disorder, small cell lung CA (1/15) s/p chemo & radiation currently being observed, dementia, cognitive developmental delays. He was found confused at his group home today. No one saw him have a seizure but noted that this is how he behaves after a seizure. He has not returned to baseline in the ER and therefore is referred for admission. He was also due for a surveillance CT of the chest today which was done in the ER.   Hospital Course:  Marco [name] is a pleasant 65 year old gentleman with a past medical history of mental retardation, seizure disorder, having history of squamous cell lung cancer receiving treatment at the cancer center, follows Dr. [name]. He was admitted to the medicine service on 01/25/2016 when he presented with confusion. Group home staff concerned about  behavioral changes related to seizure activity. A CT scan of lungs performed on admission revealing increased size of left upper lobe mass including involvement of left hilum and narrowing of multiple upper and lower lobe bronchi. CT scan of brain with and without contrast not show evidence of metastatic disease. During this hospitalization he was evaluated by neurology he recommended adding valproic acid to his regimen at 500 mg by mouth twice a day. This hospitalization was complicated by agitation/delirium having aggressiveness toward staff members for which she was given IV Ativan and Haldol. It seemed that IV Ativan, however, worsened his delirium at times and cause excessive daytime sedation. I believe upon continuation of this agent and trying nonpharmacologic interventions he responded better. I had spoken with his caregivers about being present at nighttime attempting reorientation therapy and other nonpharmacologic approaches during periods of agitation. By 01/29/2016 he started showing clinical improvement. His diet was subsequently advanced and was ambulated with supervision down the hallway with the use of a walker. By 01/30/2016 he continued to show significant clinical improvement. I spoke to his sister and we agreed that he was ready for discharge. He was discharged back to his group home on 01/30/2016 in stable condition. He will need close follow-up with Dr. [name] regarding his history of lung cancer.   Consultations:  Neurology  Discharge Exam: Filed Vitals:   01/30/16 0642 01/30/16 1316  BP: 110/72 113/74  Pulse: 92 92  Temp: 98.1 F (36.7 C) 97.8 F (36.6 C)  Resp: 18 18    General exam: He looks much better today, more awake, ambulated down the hallway.  Respiratory system: Coarse respiratory sounds bilaterally, normal respiratory effort Cardiovascular system: S1 & S2 heard, RRR. No JVD, murmurs, rubs, gallops or clicks. No pedal edema. Gastrointestinal system: Abdomen is  nondistended, soft and nontender. No organomegaly or masses felt. Normal bowel sounds heard. Central nervous system: Patient sedated cannot assess neurologic status Extremities: Symmetric 5 x 5 power. Skin: No rashes, lesions or ulcers Psychiatry: Patient sedated  Discharge Instructions   Discharge Instructions    Call MD for:  difficulty breathing, headache or visual disturbances    Complete by:  As directed      Call MD for:  extreme fatigue    Complete by:  As directed      Call MD for:  hives    Complete by:  As directed      Call MD for:  persistant dizziness or light-headedness    Complete by:  As directed      Call MD for:  persistant nausea and vomiting    Complete by:  As directed      Call MD for:  redness, tenderness, or signs of infection (pain, swelling, redness, odor or green/yellow discharge around incision site)    Complete by:  As directed      Call MD for:  severe uncontrolled pain    Complete by:  As directed      Call MD for:  temperature >100.4    Complete by:  As directed      Call MD for:    Complete by:  As directed      Diet - low sodium heart healthy    Complete by:  As directed      Increase activity slowly    Complete by:  As directed           Current Discharge Medication List    START taking these medications   Details  valproic acid (DEPAKENE) 250 MG capsule Take 2 capsules (500 mg total) by mouth 2 (two) times daily. Qty: 60 capsule, Refills: 1      CONTINUE these medications which have NOT CHANGED   Details  acetaminophen (TYLENOL) 500 MG tablet Take 500 mg by mouth every 4 (four) hours as needed for headache.    bisacodyl (DULCOLAX) 5 MG EC tablet Take 5 mg by mouth daily as needed for moderate constipation (constipation).     diazepam (DIASTAT ACUDIAL) 10 MG GEL Place 5 mg rectally once. Can repeat in 4-6 hours once. Qty: 1 Package, Refills: 5    donepezil (ARICEPT) 10 MG tablet Take 10 mg by mouth at bedtime.     fluticasone  (FLONASE) 50 MCG/ACT nasal spray Place 2 sprays into both nostrils daily.    HYDROcodone-homatropine (HYCODAN) 5-1.5 MG/5ML syrup Take 5 mLs by mouth every 6 (six) hours as needed for cough. Qty: 120 mL, Refills: 0    lacosamide (VIMPAT) 200 MG TABS tablet Take 1 tablet (200 mg total) by mouth 2 (two) times daily. Qty: 60 tablet, Refills: 11    levETIRAcetam (KEPPRA) 750 MG tablet Take 2 tablets (1,500 mg total) by mouth 2 (two) times daily. Qty: 120 tablet, Refills: 0    LORazepam (ATIVAN) 1 MG tablet Take 2 tablets by mouth at bedtime.    omeprazole (PRILOSEC) 20 MG capsule Take 20 mg by mouth 2 (two) times daily.    polyethylene glycol (MIRALAX / GLYCOLAX) packet Take 17 g by mouth daily as needed for mild constipation or moderate constipation.    prochlorperazine (COMPAZINE) 10 MG tablet  Take 10 mg by mouth every 6 (six) hours as needed for nausea or vomiting.    risperiDONE (RISPERDAL) 1 MG tablet Take 1 tablet (1 mg total) by mouth at bedtime. Qty: 30 tablet, Refills: 0    traZODone (DESYREL) 50 MG tablet Take 0.5 tablets (25 mg total) by mouth at bedtime. Qty: 30 tablet, Refills: 0    Maltodextrin-Xanthan Gum (RESOURCE THICKENUP CLEAR) POWD Take 120 g by mouth as needed. Qty: 1 Can, Refills: 0       No Known Allergies Follow-up Information    Follow up with [facility] K., MD In 2 days.   Specialty:  Oncology   Contact information:   [address] [place] [address] [phone]       Follow up with [name],[name], MD In 1 week.   Specialty:  Family Medicine   Contact information:   [place] [address] [phone]        The results of significant diagnostics from this hospitalization (including imaging, microbiology, ancillary and laboratory) are listed below for reference.    Significant Diagnostic Studies: Dg Chest 1 View  01/28/2016  CLINICAL DATA:  Diagnosed with pneumonia recently. Pt is wheezing. Pt was  also half asleep during procedure. Hx of HTN, dementia, mental retardation, and lung cancer. EXAM: CHEST 1 VIEW COMPARISON:  08/22/2015 FINDINGS: Heart size is upper normal. Left upper lobe mass again noted, obscuring the aortic knob. Interval development of left lower lobe opacity which obscures the left hemidiaphragm and is consistent with pneumonia. Right lung is clear. No pulmonary edema. IMPRESSION: 1. Hurts assistant left upper lobe mass. 2. Interval development of left lower lobe infiltrate. Consider aspiration pneumonia. Electronically Signed   By: Nolon Nations M.D.   On: 01/28/2016 15:03   Ct Head Wo Contrast  01/04/2016  CLINICAL DATA:  65 year old male status post seizure at group home today. Not responding to questions. Initial encounter. Non-small cell lung cancer. EXAM: CT HEAD WITHOUT CONTRAST TECHNIQUE: Contiguous axial images were obtained from the base of the skull through the vertex without intravenous contrast. COMPARISON:  Head CT 06/27/2015 and earlier. FINDINGS: Move Visualized paranasal sinuses and mastoids are clear. No acute osseous abnormality identified. Negative orbit and scalp soft tissues. The cerebral volume is stable. No ventriculomegaly. Stable and normal for age gray-white matter differentiation throughout the brain. No cortically based acute infarct identified. No midline shift, mass effect, or evidence of intracranial mass lesion. No acute intracranial hemorrhage identified. No suspicious intracranial vascular hyperdensity. IMPRESSION: Stable non contrast CT appearance of the brain. Note that early Metastatic disease to the brain cannot be excluded in the absence of intravenous contrast. Electronically Signed   By: Genevie Ann M.D.   On: 01/04/2016 10:14   Ct Head W Wo Contrast  01/25/2016  CLINICAL DATA:  Found unconscious this morning. Possible seizure. History of seizures and lung cancer. EXAM: CT HEAD WITHOUT AND WITH CONTRAST TECHNIQUE: Contiguous axial images were  obtained from the base of the skull through the vertex without and with intravenous contrast CONTRAST:  68m ISOVUE-300 IOPAMIDOL (ISOVUE-300) INJECTION 61% COMPARISON:  Noncontrast head CT 01/04/2016.  Brain MRI 04/17/2015. FINDINGS: There is no evidence of acute cortical infarct, intracranial hemorrhage, mass, midline shift, or extra-axial fluid collection. Mild cerebral atrophy is unchanged. A cavum septum pellucidum et vergae is again noted. There is no evidence of brain edema. No abnormal enhancement is identified within limitations of mild motion artifact. Prior left cataract extraction is again  noted. The paranasal sinuses and mastoid air cells are clear. No destructive osseous lesion is seen. IMPRESSION: No evidence of acute intracranial abnormality or intracranial metastatic disease. Electronically Signed   By: Logan Bores M.D.   On: 01/25/2016 15:58   Ct Chest W Contrast  01/25/2016  CLINICAL DATA:  Found unconscious this morning. History of seizures. History of lung cancer status post radiation therapy. EXAM: CT CHEST WITH CONTRAST TECHNIQUE: Multidetector CT imaging of the chest was performed during intravenous contrast administration. CONTRAST:  28m ISOVUE-300 IOPAMIDOL (ISOVUE-300) INJECTION 61% COMPARISON:  10/18/2015 FINDINGS: Mediastinum/Lymph Nodes: Asymmetric left-sided gynecomastia is similar to the prior study. No supraclavicular, axillary, mediastinal, or right hilar lymph node enlargement is identified. Small pericardial effusion has not significantly changed. Aorta is normal in caliber. No evidence of central pulmonary embolism on this nondedicated study. Mild distention of the esophagus is again seen with a small amount of fluid or debris in its midportion which may reflect dysmotility or reflux. Lungs/Pleura: Medial left upper lobe lung mass has increased in size, measuring 7.1 x 4.4 x 6.7 cm (previously 6.1 x 3.7 x 5.6 cm). Extension superiorly in the posterior left upper lobe along  the pleura measures up to 1.6 cm in thickness, stable to minimally increased from prior. There also appears to be mildly increased inferior extension of tumor into the left hilum with narrowing of multiple left upper and now lower lobe bronchi. There are new patchy opacities posteriorly in the left lower lobe predominantly in the superior segment. Evaluation of the lung parenchyma is mildly limited by motion artifact. 4 mm nodule along the superior aspect of the right major fissure (series 10, image 60) and 3 mm nodule along the central aspect of the right major fissure (series 10, image 79) are unchanged. Subtle nodular opacities in the inferolateral right upper lobe on the prior study have resolved. There is minimal ground-glass and micronodular opacity posteriorly in the right upper lobe which has increased from prior and is likely infectious/inflammatory. No pleural effusion. Upper abdomen: A 2.1 cm right upper pole renal cyst is partially visualized. There also 2 subcentimeter low-density lesions in the upper pole of the left kidney. Musculoskeletal: No suspicious lytic or blastic osseous lesion is identified. IMPRESSION: Increased size of left upper lobe mass, including involvement of the left hilum with narrowing of multiple upper and lower lobe bronchi. New patchy left lower lobe opacity may reflect postobstructive pneumonitis. Electronically Signed   By: ALogan BoresM.D.   On: 01/25/2016 15:52   Mr Attempted SDaymon LarsenReport  01/26/2016  This examination belongs to an outside facility and is stored here for comparison purposes only.  Contact the originating outside institution for any associated report or interpretation.   Microbiology: Recent Results (from the past 240 hour(s))  MRSA PCR Screening     Status: None   Collection Time: 01/25/16  8:27 PM  Result Value Ref Range Status   MRSA by PCR NEGATIVE NEGATIVE Final    Comment:        The GeneXpert MRSA Assay (FDA approved for NASAL  specimens only), is one component of a comprehensive MRSA colonization surveillance program. It is not intended to diagnose MRSA infection nor to guide or monitor treatment for MRSA infections.      Labs: Basic Metabolic Panel:  Recent Labs Lab 01/25/16 0955 01/27/16 0518 01/28/16 0518 01/29/16 0551  NA 141 143 147* 144  K 3.3* 3.8 3.7 3.2*  CL 106 111 112* 110  CO2 26 24  28 25  GLUCOSE 85 92 89 71  BUN 22* '15 15 15  '$ CREATININE 1.33* 1.04 1.02 0.78  CALCIUM 9.3 8.3* 8.2* 8.1*   Liver Function Tests:  Recent Labs Lab 01/26/16 1405  AST 39  ALT 18  ALKPHOS 61  BILITOT 1.3*  PROT 6.8  ALBUMIN 3.8   No results for input(s): LIPASE, AMYLASE in the last 168 hours.  Recent Labs Lab 01/26/16 1405  AMMONIA 28   CBC:  Recent Labs Lab 01/25/16 0955 01/27/16 0518 01/28/16 0518 01/29/16 0551  WBC 15.1* 14.2* 9.7 7.7  HGB 15.0 14.2 13.3 12.3*  HCT 44.1 43.3 40.8 38.0*  MCV 87.8 91.4 92.5 92.5  PLT 171 137* 124* 136*   Cardiac Enzymes: No results for input(s): CKTOTAL, CKMB, CKMBINDEX, TROPONINI in the last 168 hours. BNP: BNP (last 3 results) No results for input(s): BNP in the last 8760 hours.  ProBNP (last 3 results) No results for input(s): PROBNP in the last 8760 hours.  CBG: No results for input(s): GLUCAP in the last 168 hours.     Signed:  Kelvin Cellar MD.  [facility] 01/30/2016, 2:29 PM

## 2016-02-01 ENCOUNTER — Ambulatory Visit (HOSPITAL_BASED_OUTPATIENT_CLINIC_OR_DEPARTMENT_OTHER): Payer: Medicare Other | Admitting: Internal Medicine

## 2016-02-01 ENCOUNTER — Encounter: Payer: Self-pay | Admitting: Internal Medicine

## 2016-02-01 VITALS — BP 111/70 | HR 106 | Temp 97.9°F | Resp 18 | Ht 72.0 in | Wt 130.9 lb

## 2016-02-01 DIAGNOSIS — C3412 Malignant neoplasm of upper lobe, left bronchus or lung: Secondary | ICD-10-CM

## 2016-02-01 DIAGNOSIS — C3492 Malignant neoplasm of unspecified part of left bronchus or lung: Secondary | ICD-10-CM

## 2016-02-01 DIAGNOSIS — R569 Unspecified convulsions: Secondary | ICD-10-CM | POA: Diagnosis not present

## 2016-02-01 NOTE — Progress Notes (Signed)
[place] [phone]   Fax:([phone]) [phone]  OFFICE PROGRESS NOTE  [name],[name], MD [address] [place] [address]  DIAGNOSIS: Stage IIA (T2a., N1, M0) non-small cell lung cancer, squamous cell carcinoma presented with central left upper lobe mass as well as hilar lymphadenopathy diagnosed in January of 2015.  PRIOR THERAPY:  1. Concurrent chemoradiation with weekly carboplatin for AUC of 2 and paclitaxel 45 mg/M2, status post 7 cycles. Last cycle was given 12/22/2013  2. Systemic chemotherapy with carboplatin for AUC of 5 and paclitaxel 175 MG/M2 with Neulasta support every 3 weeks. Status post 3 cycles. 3.  Immunotherapy with Nivolumab 3 mg/kg given every 2 weeks. Status post 9 cycles. Discontinued secondary to noncompliance but the patient has a stable disease.  CURRENT THERAPY: Observation.  INTERVAL HISTORY: Marco [name] 64 y.o. male returns to the clinic today for followup visit accompanied by his caregiver. He was treated with immunotherapy with Nivolumab status post 9 cycles and tolerating his treatment fairly well but this was discontinued because of the noncompliance and the patient use to leave the chemotherapy room before finishing his treatment. Has been observation for the last few months. Unfortunately he was admitted recently to [facility] with frequent seizure and he is currently on valproic acid in addition to [place]. He has a lot of drooling and loss of urinary control. The patient also has increasing fatigue and weakness.He denied having any nausea or vomiting, no fever or chills. He denied having any chest pain, shortness of breath, but continues to have mild cough with no hemoptysis. He denied having any significant weight loss or night sweats. He had repeat CT scan of the chest performed recently during his hospitalization and he is here for evaluation and discussion of his scan results. CT scan of the  head showed no evidence for metastatic disease to the brain.  MEDICAL HISTORY: Past Medical History  Diagnosis Date  . Mental disorder     sczizophrenia;moderate retardation  . Depression   . DEMENTIA   . Hypertension     no medications, no documented history per caregiver at preadmission  . GERD (gastroesophageal reflux disease)   . Seizures ([place])   . Developmental disability     developmentaly delayed  . Hx of radiation therapy 11/07/13-12/24/13    lung,60Gy/49f  . Mental retardation   . [place]izure disorder ([facility]   . [place] cancer [place]   . Encounter for antineoplastic immunotherapy 04/05/2015    ALLERGIES:  has No Known Allergies.  MEDICATIONS:  Current Outpatient Prescriptions  Medication Sig Dispense Refill  . acetaminophen (TYLENOL) 500 MG tablet Take 500 mg by mouth every 4 (four) hours as needed for headache.    . bisacodyl (DULCOLAX) 5 MG EC tablet Take 5 mg by mouth daily as needed for moderate constipation (constipation).     . diazepam (DIASTAT ACUDIAL) 10 MG GEL Place 5 mg rectally once. Can repeat in 4-6 hours once. (Patient taking differently: Place 5 mg rectally as needed for seizure. Can repeat in 4-6 hours once.) 1 Package 5  . donepezil (ARICEPT) 10 MG tablet Take 10 mg by mouth at bedtime.     . fluticasone (FLONASE) 50 MCG/ACT nasal spray Place 2 sprays into both nostrils daily.    .Marland KitchenHYDROcodone-homatropine (HYCODAN) 5-1.5 MG/5ML syrup Take 5 mLs by mouth every 6 (six) hours as needed for cough. 120 mL 0  . lacosamide (VIMPAT) 200 MG TABS tablet Take  1 tablet (200 mg total) by mouth 2 (two) times daily. 60 tablet 11  . levETIRAcetam (KEPPRA) 750 MG tablet Take 2 tablets (1,500 mg total) by mouth 2 (two) times daily. 120 tablet 0  . LORazepam (ATIVAN) 1 MG tablet Take 2 tablets by mouth at bedtime.    . Maltodextrin-Xanthan Gum (RESOURCE THICKENUP CLEAR) POWD Take 120 g by mouth as needed. 1 Can 0  . omeprazole (PRILOSEC) 20 MG capsule Take 20 mg by mouth 2 (two)  times daily.    . polyethylene glycol (MIRALAX / GLYCOLAX) packet Take 17 g by mouth daily as needed for mild constipation or moderate constipation.    . prochlorperazine (COMPAZINE) 10 MG tablet Take 10 mg by mouth every 6 (six) hours as needed for nausea or vomiting.    . risperiDONE (RISPERDAL) 1 MG tablet Take 1 tablet (1 mg total) by mouth at bedtime. 30 tablet 0  . traZODone (DESYREL) 50 MG tablet Take 0.5 tablets (25 mg total) by mouth at bedtime. 30 tablet 0  . valproic acid (DEPAKENE) 250 MG capsule Take 2 capsules (500 mg total) by mouth 2 (two) times daily. 60 capsule 1   No current facility-administered medications for this visit.    SURGICAL HISTORY:  Past Surgical History  Procedure Laterality Date  . Cataract extraction w/phaco  07/12/2011    Procedure: CATARACT EXTRACTION PHACO AND INTRAOCULAR LENS PLACEMENT (IOC);  Surgeon: Adonis Brook;  Location: [facility];  Service: Ophthalmology;  Laterality: Left;  Marland Kitchen Eye surgery      L eye  . Video bronchoscopy Bilateral 10/01/2013    Procedure: VIDEO BRONCHOSCOPY WITHOUT FLUORO;  Surgeon: Collene Gobble, MD;  Location: [facility];  Service: Cardiopulmonary;  Laterality: Bilateral;    REVIEW OF SYSTEMS:  A comprehensive review of systems was negative except for: Constitutional: positive for fatigue and weight loss Respiratory: positive for cough Genitourinary: positive for urinary incontinence Musculoskeletal: positive for muscle weakness Neurological: positive for seizures   PHYSICAL EXAMINATION: General appearance: alert, cooperative and no distress Head: Normocephalic, without obvious abnormality, atraumatic Neck: no adenopathy, no JVD, supple, symmetrical, trachea midline and thyroid not enlarged, symmetric, no tenderness/mass/nodules Lymph nodes: Cervical, supraclavicular, and axillary nodes normal. Resp: clear to auscultation bilaterally Back: symmetric, no curvature. ROM normal. No CVA tenderness. Cardio: regular rate and  rhythm, S1, S2 normal, no murmur, click, rub or gallop GI: soft, non-tender; bowel sounds normal; no masses,  no organomegaly Extremities: extremities normal, atraumatic, no cyanosis or edema Neurologic: Alert and oriented X 3, normal strength and tone. Normal symmetric reflexes. Normal coordination and gait  ECOG PERFORMANCE STATUS: 1 - Symptomatic but completely ambulatory  Blood pressure 111/70, pulse 106, temperature 97.9 F (36.6 C), temperature source Oral, resp. rate 18, height 6' (1.829 m), weight 130 lb 14.4 oz (59.376 kg), SpO2 98 %.  LABORATORY DATA: Lab Results  Component Value Date   WBC 7.7 01/29/2016   HGB 12.3* 01/29/2016   HCT 38.0* 01/29/2016   MCV 92.5 01/29/2016   PLT 136* 01/29/2016      Chemistry      Component Value Date/Time   NA 144 01/29/2016 0551   NA 141 10/18/2015 1027   NA 143 09/29/2015 1346   K 3.2* 01/29/2016 0551   K 3.8 10/18/2015 1027   CL 110 01/29/2016 0551   CO2 25 01/29/2016 0551   CO2 29 10/18/2015 1027   BUN 15 01/29/2016 0551   BUN 13.9 10/18/2015 1027   BUN 10 09/29/2015 1346  CREATININE 0.78 01/29/2016 0551   CREATININE 1.1 10/18/2015 1027      Component Value Date/Time   CALCIUM 8.1* 01/29/2016 0551   CALCIUM 9.2 10/18/2015 1027   ALKPHOS 61 01/26/2016 1405   ALKPHOS 66 10/18/2015 1027   AST 39 01/26/2016 1405   AST 10 10/18/2015 1027   ALT 18 01/26/2016 1405   ALT 9 10/18/2015 1027   BILITOT 1.3* 01/26/2016 1405   BILITOT 0.64 10/18/2015 1027   BILITOT <0.2 09/29/2015 1346       RADIOGRAPHIC STUDIES: Dg Chest 1 View  01/28/2016  CLINICAL DATA:  Diagnosed with pneumonia recently. Pt is wheezing. Pt was also half asleep during procedure. Hx of HTN, dementia, mental retardation, and lung cancer. EXAM: CHEST 1 VIEW COMPARISON:  08/22/2015 FINDINGS: Heart size is upper normal. Left upper lobe mass again noted, obscuring the aortic knob. Interval development of left lower lobe opacity which obscures the left  hemidiaphragm and is consistent with pneumonia. Right lung is clear. No pulmonary edema. IMPRESSION: 1. Hurts assistant left upper lobe mass. 2. Interval development of left lower lobe infiltrate. Consider aspiration pneumonia. Electronically Signed   By: Nolon Nations M.D.   On: 01/28/2016 15:03   Ct Head Wo Contrast  01/04/2016  CLINICAL DATA:  65 year old male status post seizure at group home today. Not responding to questions. Initial encounter. Non-small cell lung cancer. EXAM: CT HEAD WITHOUT CONTRAST TECHNIQUE: Contiguous axial images were obtained from the base of the skull through the vertex without intravenous contrast. COMPARISON:  Head CT 06/27/2015 and earlier. FINDINGS: Move Visualized paranasal sinuses and mastoids are clear. No acute osseous abnormality identified. Negative orbit and scalp soft tissues. The cerebral volume is stable. No ventriculomegaly. Stable and normal for age gray-white matter differentiation throughout the brain. No cortically based acute infarct identified. No midline shift, mass effect, or evidence of intracranial mass lesion. No acute intracranial hemorrhage identified. No suspicious intracranial vascular hyperdensity. IMPRESSION: Stable non contrast CT appearance of the brain. Note that early Metastatic disease to the brain cannot be excluded in the absence of intravenous contrast. Electronically Signed   By: Genevie Ann M.D.   On: 01/04/2016 10:14   Ct Head W Wo Contrast  01/25/2016  CLINICAL DATA:  Found unconscious this morning. Possible seizure. History of seizures and lung cancer. EXAM: CT HEAD WITHOUT AND WITH CONTRAST TECHNIQUE: Contiguous axial images were obtained from the base of the skull through the vertex without and with intravenous contrast CONTRAST:  27m ISOVUE-300 IOPAMIDOL (ISOVUE-300) INJECTION 61% COMPARISON:  Noncontrast head CT 01/04/2016.  Brain MRI 04/17/2015. FINDINGS: There is no evidence of acute cortical infarct, intracranial hemorrhage,  mass, midline shift, or extra-axial fluid collection. Mild cerebral atrophy is unchanged. A cavum septum pellucidum et vergae is again noted. There is no evidence of brain edema. No abnormal enhancement is identified within limitations of mild motion artifact. Prior left cataract extraction is again noted. The paranasal sinuses and mastoid air cells are clear. No destructive osseous lesion is seen. IMPRESSION: No evidence of acute intracranial abnormality or intracranial metastatic disease. Electronically Signed   By: ALogan BoresM.D.   On: 01/25/2016 15:58   Ct Chest W Contrast  01/25/2016  CLINICAL DATA:  Found unconscious this morning. History of seizures. History of lung cancer status post radiation therapy. EXAM: CT CHEST WITH CONTRAST TECHNIQUE: Multidetector CT imaging of the chest was performed during intravenous contrast administration. CONTRAST:  781mISOVUE-300 IOPAMIDOL (ISOVUE-300) INJECTION 61% COMPARISON:  10/18/2015 FINDINGS: Mediastinum/Lymph Nodes: Asymmetric  left-sided gynecomastia is similar to the prior study. No supraclavicular, axillary, mediastinal, or right hilar lymph node enlargement is identified. Small pericardial effusion has not significantly changed. Aorta is normal in caliber. No evidence of central pulmonary embolism on this nondedicated study. Mild distention of the esophagus is again seen with a small amount of fluid or debris in its midportion which may reflect dysmotility or reflux. Lungs/Pleura: Medial left upper lobe lung mass has increased in size, measuring 7.1 x 4.4 x 6.7 cm (previously 6.1 x 3.7 x 5.6 cm). Extension superiorly in the posterior left upper lobe along the pleura measures up to 1.6 cm in thickness, stable to minimally increased from prior. There also appears to be mildly increased inferior extension of tumor into the left hilum with narrowing of multiple left upper and now lower lobe bronchi. There are new patchy opacities posteriorly in the left lower  lobe predominantly in the superior segment. Evaluation of the lung parenchyma is mildly limited by motion artifact. 4 mm nodule along the superior aspect of the right major fissure (series 10, image 60) and 3 mm nodule along the central aspect of the right major fissure (series 10, image 79) are unchanged. Subtle nodular opacities in the inferolateral right upper lobe on the prior study have resolved. There is minimal ground-glass and micronodular opacity posteriorly in the right upper lobe which has increased from prior and is likely infectious/inflammatory. No pleural effusion. Upper abdomen: A 2.1 cm right upper pole renal cyst is partially visualized. There also 2 subcentimeter low-density lesions in the upper pole of the left kidney. Musculoskeletal: No suspicious lytic or blastic osseous lesion is identified. IMPRESSION: Increased size of left upper lobe mass, including involvement of the left hilum with narrowing of multiple upper and lower lobe bronchi. New patchy left lower lobe opacity may reflect postobstructive pneumonitis. Electronically Signed   By: Logan Bores M.D.   On: 01/25/2016 15:52   Mr Attempted Daymon Larsen Report  01/26/2016  This examination belongs to an outside facility and is stored here for comparison purposes only.  Contact the originating outside institution for any associated report or interpretation.  ASSESSMENT AND PLAN: This is a very pleasant 65 years old [place] American male with unresectable a stage IIA non-small cell lung cancer. He completed a course of concurrent chemoradiation with weekly carboplatin and paclitaxel status post 7 cycles. Has been observation since his treatment in April 2015.  His restaging CT scan of the chest showed further enlargement of the left upper lobe suprahilar mass concerning for disease recurrence. The patient was treated with 3 cycles of systemic chemotherapy with carboplatin and paclitaxel but unfortunately has evidence for disease  progression. He was started on treatment with immunotherapy with Nivolumab status post 9 cycles and tolerating his treatment fairly well. The patient has been observation but the recent CT scan of the chest showed evidence for disease progression. He'll also has several other issues including recurrent seizure in addition to urinary incontinence and drooling. I do think the patient would be a good candidate to resume any systemic therapy at this point. I had a lengthy discussion with the patient and his caregiver about his current condition. I strongly recommended for them to consider palliative care and hospice at this point. He will discuss this option with the primary care physician during a visit tomorrow. I will see the patient on as-needed basis at this point. He was advised to call immediately if he has any concerning symptoms in the interval. The patient  voices understanding of current disease status and treatment options and is in agreement with the current care plan.  All questions were answered. The patient knows to call the clinic with any problems, questions or concerns. We can certainly see the patient much sooner if necessary.  Disclaimer: This note was dictated with voice recognition software. Similar sounding words can inadvertently be transcribed and may not be corrected upon review.

## 2016-02-02 ENCOUNTER — Telehealth: Payer: Self-pay | Admitting: *Deleted

## 2016-02-02 NOTE — Telephone Encounter (Signed)
Marco [name] discussed MD saw pt 5/30 recommened hospice/pallative care. Zahkyyah confirmed pt saw pcp today andhas chosen to go to hospice. No further concerns.

## 2016-02-07 ENCOUNTER — Telehealth: Payer: Self-pay

## 2016-02-07 ENCOUNTER — Ambulatory Visit (INDEPENDENT_AMBULATORY_CARE_PROVIDER_SITE_OTHER): Payer: Medicare Other | Admitting: Neurology

## 2016-02-07 ENCOUNTER — Encounter: Payer: Self-pay | Admitting: Neurology

## 2016-02-07 ENCOUNTER — Other Ambulatory Visit: Payer: Self-pay | Admitting: Medical Oncology

## 2016-02-07 VITALS — BP 101/68 | HR 125 | Ht 72.0 in | Wt 122.8 lb

## 2016-02-07 DIAGNOSIS — C3492 Malignant neoplasm of unspecified part of left bronchus or lung: Secondary | ICD-10-CM

## 2016-02-07 DIAGNOSIS — G40009 Localization-related (focal) (partial) idiopathic epilepsy and epileptic syndromes with seizures of localized onset, not intractable, without status epilepticus: Secondary | ICD-10-CM

## 2016-02-07 MED ORDER — DIVALPROEX SODIUM ER 500 MG PO TB24: 500.0000 mg | ORAL_TABLET | Freq: Two times a day (BID) | ORAL | Status: DC

## 2016-02-07 MED ORDER — LEVETIRACETAM ER 500 MG PO TB24: 1500.0000 mg | ORAL_TABLET | Freq: Two times a day (BID) | ORAL | Status: DC

## 2016-02-07 MED ORDER — HYDROCODONE-HOMATROPINE 5-1.5 MG/5ML PO SYRP: 5.0000 mL | ORAL_SOLUTION | Freq: Four times a day (QID) | ORAL | Status: DC | PRN

## 2016-02-07 NOTE — Progress Notes (Signed)
[facility]    Provider:  Dr Jaynee Eagles Referring Provider: Leamon Arnt, MD Primary Care Physician:  Leamon Arnt, MD  CC: Seizures  Follow up 02/07/2016: KIPPY GOHMAN is a 65 y.o. male here as a follow up. He has a PMHx of refractory seizures, non-small cell lung cancer s/p chemo and radiation, mental retardation and dementia.  Patient continues to have breakthrough seizures despite good compliance. He was on keppra '1500mg'$  and vimpat '200mg'$  bid and had another seizure. He is now on 3 seizure medications since the addition of Depakote in the hospital last month. He presented with alteration of consciousness and non-purposeful movements lasting 1-2 minutes, postictal afterwards, he was brought to the ED and had another GTCS. No recent illnesses or inciting events. By report at his group home he had staring spells, unresponsive, grasping at the air. Depakote '500mg'$  BID was added to his regimen. Here with is borther.  He also had a GTC earlier in the month in addition to this ED visit and admission but he went to his pcp and medications weren't checked.   09/29/2015, 65 y.o. male with a history of moderate retardation, schizophrenia, who presented with breakthrough seizures in December 2016. He was on 100 mg of Vimpat twice a day as well as 1500 mg of Keppra twice a day. He did not miss any of his doses of medications and presented with breakthrough seizure which aborted with IV Versed en route. He had a cold at the time but no fever. He takes Lorazepam '2mg'$  a night at bedtime and this is chronic. His brother is here with him who provides most information. He had another breakthrough seizure in his bedroom in the evening. He never misses his medications. It was GTCs. They called EMS and he was hospitalized. His Keppra kept the same, Now Vimpat increased to '200mg'$  bid.   Previous to this event, a 3-day ambulatory VEEG was normal.   05/20/2015: He is doing better. He was increased to  [place] '1500mg'$  twice a day after a breakthrough seizure and admission with intubation in August. No seizures since August 14th. No inciting events, no illnesses or other trigger for the breakthrough. Everything is better. However sometimes when brother speaks to patient, patient doesn't hear what the brother says and the brother has to repeat himself. The patient just stares at brother. Brother wonders if he is having seizures, he blank stares a lot. Patient is tired and not so sharp but he is going through chemotherapy currently. Brother notices it 2-3x a day at least.   MRI 04/2015:personally reviewed images and agree, no intracranial metastasis 1. No acute intracranial process identified. No evidence for intracranial metastasis. 2. Mild to moderate generalized cerebral atrophy  HPI: RODOLPHE EDMONSTON is a 65 y.o. male here as a follow up. He is a former patient of dr. Janann Colonel. He has a PMHx of seizures, non-small cell lung cancer s/p chemo and radiation, mental retardation.   Patient is with aid who works in the group home where patient resides. Aid provides all information. Patient is on [place]. It was increased to '1000mg'$  twice a day in February when he had seizures. Last seizure was a month ago. Feb 3rd. Patient was coming downstairs. Seizures just sits there and looks dazed. Doesn't move at all. He stares for 2 minutes per aid (hospital notes report GTCS as well). The Keppra is working. The last time he had a seizure in February was due to illness. Otherwise the medication has  helped. He is alert, mood is good, not irritable. Everything is going well.   Reviewed notes, labs and imaging from outside physicians, which showed:  Patient had new onset seizures in 2014. Baseline MR. Lives in a group home. Has had GTCS and episodes of staring. He was hospitalized in February after GTCS. He was persistently post ictal   MRI of the brain 10/09/2014 (personally reviewed and agree with findings): Edema and low  level restricted diffusion in the hippocampus/mesial temporal lobe on the left, most typical of postictal change. The patient could be restudied in the future distant from any seizure event to see if this area has a normalized appearance or any definable permanent underlying abnormality. CBC/CMP unremarkable HgbA1c 5.6  EEG 10/08/2014: IMPRESSION: This is an abnormal electroencephalogram secondary to general  background slowing. This may be seen with a diffuse disturbance  that is etiologically nonspecific, but may include the post-ictal  state, among other possibilities. There were also frequent sharp  transients over the left hemisphere with phase reversal at [facility]  consistent with the patient's history of seizures.      Social History   Social History  . Marital Status: Single    Spouse Name: N/A  . Number of Children: 0  . Years of Education: <12   Occupational History  . Not on file.   Social History Main Topics  . Smoking status: Former Smoker -- 0.50 packs/day for 35 years    Types: Cigarettes    Quit date: 09/07/2013  . Smokeless tobacco: Not on file  . Alcohol Use: No  . Drug Use: No  . Sexual Activity: No   Other Topics Concern  . Not on file   Social History Narrative   ** Merged History Encounter **       ** Merged History Encounter **       Patient lives in a group with 3 other brothers.    Patient does not work.   Patient is single.    Patient has some HS education.    Patient has no children.    Patient has a history of psychotic disorder NOS and mental retardation.    Caffeine: Drinks 3 cups coffee per day.     Family History  Problem Relation Age of Onset  . Hypertension Mother   . Hypertension Father     Past Medical History  Diagnosis Date  . Mental disorder     sczizophrenia;moderate retardation  . Depression   . DEMENTIA   . Hypertension     no medications, no documented history per caregiver at preadmission  . GERD  (gastroesophageal reflux disease)   . Seizures ([place])   . Developmental disability     developmentaly delayed  . Hx of radiation therapy 11/07/13-12/24/13    lung,60Gy/20f  . Mental retardation   . Seizure disorder ([facility]   . Lung cancer ([place]   . Encounter for antineoplastic immunotherapy 04/05/2015    Past Surgical History  Procedure Laterality Date  . Cataract extraction w/phaco  07/12/2011    Procedure: CATARACT EXTRACTION PHACO AND INTRAOCULAR LENS PLACEMENT (IOC);  Surgeon: GAdonis Brook  [facility];  Service: Ophthalmology;  Laterality: Left;  .Marland KitchenEye surgery      L eye  . Video bronchoscopy Bilateral 10/01/2013    Procedure: VIDEO BRONCHOSCOPY WITHOUT FLUORO;  Surgeon: RCollene Gobble MD;  [facility]  Service: Cardiopulmonary;  Laterality: Bilateral;    Current Outpatient Prescriptions  Medication Sig Dispense Refill  . acetaminophen (  TYLENOL) 500 MG tablet Take 500 mg by mouth every 4 (four) hours as needed for headache.    . bisacodyl (DULCOLAX) 5 MG EC tablet Take 5 mg by mouth daily as needed for moderate constipation (constipation).     . diazepam (DIASTAT ACUDIAL) 10 MG GEL Place 5 mg rectally once. Can repeat in 4-6 hours once. (Patient taking differently: Place 5 mg rectally as needed for seizure. Can repeat in 4-6 hours once.) 1 Package 5  . donepezil (ARICEPT) 10 MG tablet Take 10 mg by mouth at bedtime.     . fluticasone (FLONASE) 50 MCG/ACT nasal spray Place 2 sprays into both nostrils daily.    Marland Kitchen HYDROcodone-homatropine (HYCODAN) 5-1.5 MG/5ML syrup Take 5 mLs by mouth every 6 (six) hours as needed for cough. 120 mL 0  . lacosamide (VIMPAT) 200 MG TABS tablet Take 1 tablet (200 mg total) by mouth 2 (two) times daily. 60 tablet 11  . levETIRAcetam (KEPPRA) 750 MG tablet Take 2 tablets (1,500 mg total) by mouth 2 (two) times daily. 120 tablet 0  . LORazepam (ATIVAN) 1 MG tablet Take 2 tablets by mouth at bedtime.    . Maltodextrin-Xanthan Gum (RESOURCE  THICKENUP CLEAR) POWD Take 120 g by mouth as needed. 1 Can 0  . omeprazole (PRILOSEC) 20 MG capsule Take 20 mg by mouth 2 (two) times daily.    . polyethylene glycol (MIRALAX / GLYCOLAX) packet Take 17 g by mouth daily as needed for mild constipation or moderate constipation.    . prochlorperazine (COMPAZINE) 10 MG tablet Take 10 mg by mouth every 6 (six) hours as needed for nausea or vomiting.    . risperiDONE (RISPERDAL) 1 MG tablet Take 1 tablet (1 mg total) by mouth at bedtime. 30 tablet 0  . traZODone (DESYREL) 50 MG tablet Take 0.5 tablets (25 mg total) by mouth at bedtime. 30 tablet 0  . valproic acid (DEPAKENE) 250 MG capsule Take 2 capsules (500 mg total) by mouth 2 (two) times daily. 60 capsule 1   No current facility-administered medications for this visit.    Allergies as of 02/07/2016  . (No Known Allergies)    Vitals: BP 101/68 mmHg  Pulse 125  Ht 6' (1.829 m)  Wt 122 lb 12.8 oz (55.702 kg)  BMI 16.65 kg/m2 Last Weight:  Wt Readings from Last 1 Encounters:  02/07/16 122 lb 12.8 oz (55.702 kg)   Last Height:   Ht Readings from Last 1 Encounters:  02/07/16 6' (1.829 m)    Speech:  Speech is dysarthric, no aphasia Cognition:  The patient is oriented to person only  Cranial Nerves:  The pupils are equal, round, and reactive to light. Visual fields are full to threat. Extraocular movements are intact. Hearing intact to voice. Voice is normal. Shoulder shrug is normal. The tongue has normal motion without fasciculations.  Gait:  heel-toe intact  Motor Observation:  No asymmetry, no atrophy, and no involuntary movements noted. Tone:  Normal muscle tone.   Posture:  Posture is normal. normal erect   Strength:  Strength is V/V in the upper and lower limbs.       Assessment/Plan: Mr. Skaggs is a pleasant 65 year old gentleman with a history of mental retardation, schizophrenia and dementia presenting for follow up evaluation for  seizure disorder. Has had multiple breakthrough events resulting in status epilepticus and hospital admissions now on 3 seizure medications..    1. Continue Vimpat to '200mg'$  bid 2. dose of Keppra to remain at1500 mg  PO BID but will change to XR formulation bid 3. Rectal diazepam for GTCS  4. Will change his Depakote BID to Depakote ER bid  5. Recommend being seen at [facility], Dr Ernest Haber, epilepsy center possible VNS (vagal nerve stimulator) and second opinion. He will talk to guardian and get back with me thanks.   Dx: Partial Seizure with secondary generalization Schizophrenia Developmental disability Dementia Non-small cell lung cancer Essential hypertension   Sarina Ill, MD  [facility] [address] [place], [place] [address]  Phone [phone] Fax [phone]  A total of 30 minutes was spent face-to-face with this patient. Over half this time was spent on counseling patient on the seizure diagnosis and different diagnostic and therapeutic options available.

## 2016-02-07 NOTE — Telephone Encounter (Signed)
Family called requesting refill of hycodan cough syrup. Per 5/31 telephone note he is a hospice pt.

## 2016-02-07 NOTE — Patient Instructions (Addendum)
Remember to drink plenty of fluid, eat healthy meals and do not skip any meals. Try to eat protein with a every meal and eat a healthy snack such as fruit or nuts in between meals. Try to keep a regular sleep-wake schedule and try to exercise daily, particularly in the form of walking, 20-30 minutes a day, if you can.   As far as your medications are concerned, I would like to suggest: Continue Vimpat(Lacosamide) '200mg'$  twice daily Keppra XR '1500mg'$  twice daily (extended release formulation) Depakote '500mg'$  ER  Twice daily Recommend being seen at [facility], Dr Ernest Haber, epilepsy center possible VNS (vagal nerve stimulator) and second opinion  As far as diagnostic testing: Labs today  I would like to see you back in 3 months, sooner if we need to. Please call us with any interim questions, concerns, problems, updates or refill requests.   Our phone number is [phone]. We also have an after hours call service for urgent matters and there is a physician on-call for urgent questions. For any emergencies you know to call 911 or go to the nearest emergency room

## 2016-02-07 NOTE — Telephone Encounter (Signed)
Patient was fine when I saw him, the scrape was fine too, he said it didn't hurt and even forgot about it thanks

## 2016-02-07 NOTE — Telephone Encounter (Signed)
I contacted [name] and she said she did not call for rx refill hycodan. I called Hospice regarding request. I left a message with [name] to call me with name of hospice agency involved with pt.

## 2016-02-07 NOTE — Telephone Encounter (Signed)
I was alerted by the front desk that a patient had fallen in our lobby. I go to lobby, find the pt sitting in a chair. I ask him his name but he cannot tell me his name. I ask him if he fell, and he points to his right knee. I assess his right knee, he has a 0.5in long by 0.25 in wide skin tear to right lateral leg, three inches below knee, is bleeding, with a skin flap. I apply gauze and bandaids. Pt's BP was 109/75, HR of 122. There is a male with pt that cannot tell me what doctor pt is seeing but is able to verify pt's name and birth date with me. Male caregiver reports that pt's POA is "on the way". I assess pt's other knee, hands, head, and elbows for abrasions from fall. All are normal, no abrasions, erytherma, skin tears noted. Pt reports that he hit his knee on the corner of an end table before he fell and hit the floor. Pt is stable.  I discover that pt is seeing Dr. Jaynee Eagles today. Terrence Dupont, RN and Dr. Jaynee Eagles notified of incident. Dr. Jaynee Eagles should inspect skin tear to determine if pt needs stitches.

## 2016-02-08 NOTE — Telephone Encounter (Signed)
I spoke to [facility] RN- and she said [name] has called. I faxed Hycodan refill to [facility].

## 2016-02-09 ENCOUNTER — Telehealth: Payer: Self-pay | Admitting: Neurology

## 2016-02-09 NOTE — Telephone Encounter (Signed)
[facility] called to get clarification on medication levETIRAcetam (KEPPRA XR) 500 MG 24 hr tablet and Keppra '750mg'$ . Please call [phone]- ask for  Yncle or Domingo Cocking

## 2016-02-09 NOTE — Telephone Encounter (Signed)
Called pharmacy back. Advised Dr Jaynee Eagles sent in new rx keppra XR same dose at '1500mg'$  BID. She verbalized understanding. She just needed clarification. No further questions.

## 2016-02-11 ENCOUNTER — Telehealth: Payer: Self-pay | Admitting: Medical Oncology

## 2016-02-11 NOTE — Telephone Encounter (Signed)
[name] asking for hycodan refill - i told him i faxed it to [facility] earlier in week.

## 2016-02-16 ENCOUNTER — Encounter: Payer: Self-pay | Admitting: Internal Medicine

## 2016-02-16 NOTE — Progress Notes (Signed)
left in my box

## 2016-02-17 ENCOUNTER — Encounter: Payer: Self-pay | Admitting: Internal Medicine

## 2016-02-21 ENCOUNTER — Encounter: Payer: Self-pay | Admitting: Internal Medicine

## 2016-02-21 NOTE — Progress Notes (Signed)
left in my box- left for dr. [name] to sign- left mess for cheryl-sis to see how she wants to get forms. sent to medical records

## 2016-02-28 NOTE — Progress Notes (Signed)
   01/27/16 1517  SLP G-Codes **NOT FOR INPATIENT CLASS**  Functional Assessment Tool Used clinical judgement  Functional Limitations Swallowing  Swallow Current Status (L2751) CM  Swallow Goal Status (Z0017) CM  SLP Evaluations  $ SLP Speech Visit 1 Procedure  SLP Evaluations  $BSS Swallow 1 Procedure  $Swallowing Treatment 1 Procedure  Luanna Salk, [facility] SLP [phone]

## 2016-02-29 ENCOUNTER — Encounter: Payer: Self-pay | Admitting: *Deleted

## 2016-02-29 ENCOUNTER — Telehealth: Payer: Self-pay | Admitting: Medical Oncology

## 2016-02-29 NOTE — Progress Notes (Signed)
Faxed signed orders back to [facility] for change to treatment of keppra/depakote. Fax: [phone]. Received confirmation.

## 2016-02-29 NOTE — Telephone Encounter (Signed)
Terrance Mass ( "Power of attorney" ) updated pt status . Pt is back to pre seizure baseline , strength is good, he is doing all his ADLS climbing stairs. The current issue is his sight. He saw opthalmologist in [place] who is considering eye surgery . She was asking if [name] thought he was a good candidate for surgery . I told her the eye surgeon will contact [facility] if he wants input and  that generally other providers determine his surgical risk. I sent note to [facility] with update .

## 2016-03-02 ENCOUNTER — Telehealth: Payer: Self-pay | Admitting: Medical Oncology

## 2016-03-02 NOTE — Telephone Encounter (Signed)
-----   Message from Curt Bears, MD sent at 03/01/2016  6:08 PM EDT ----- Regarding: RE: update I will be happy to reevaluate him but not sure if I would consider him for future treatment based on his behavior in the past leaving the chemo room during treatment.  ----- Message -----    From: Ardeen Garland, RN    Sent: 02/29/2016   2:08 PM      To: Curt Bears, MD Subject: update                                         Marco [name] ( "Power of attorney" ) updated pt status .  Pt is back to pre seizure baseline , strength is good, he is doing all his ADLS,  climbing stairs, eating well. The current issue is his sight.   Today  , he saw opthalmologist in [place] who is considering eye surgery . She was asking if [name] thought he was a good candidate for eye surgery . I told her the eye surgeon will contact [facility] if he wants input and  that generally other providers determine his surgical risk  I know you recommended hospice or palliative care -nothing done yet. Do you want to see him back?

## 2016-03-02 NOTE — Telephone Encounter (Signed)
I gave this information to [facility] and she said she will call back after eye problem is resolved.

## 2016-03-04 ENCOUNTER — Other Ambulatory Visit: Payer: Self-pay | Admitting: Neurology

## 2016-04-13 ENCOUNTER — Telehealth: Payer: Self-pay | Admitting: *Deleted

## 2016-04-13 DIAGNOSIS — C3490 Malignant neoplasm of unspecified part of unspecified bronchus or lung: Secondary | ICD-10-CM

## 2016-04-13 MED ORDER — HYDROCODONE-HOMATROPINE 5-1.5 MG/5ML PO SYRP: 5.0000 mL | ORAL_SOLUTION | Freq: Four times a day (QID) | ORAL | 0 refills | Status: DC | PRN

## 2016-04-13 NOTE — Telephone Encounter (Signed)
TC from caregiver requesting refill for pts cough syrup.   He will come by [facility] to pick up this afternoon.

## 2016-04-20 ENCOUNTER — Telehealth: Payer: Self-pay | Admitting: *Deleted

## 2016-04-20 NOTE — Telephone Encounter (Signed)
Pt's daughter Bonne Dolores called and pt has requested to be taken off hospice and possibly restart chemotherapy. Message to scheduling for MD appt.

## 2016-04-23 ENCOUNTER — Encounter (HOSPITAL_COMMUNITY): Payer: Self-pay | Admitting: Emergency Medicine

## 2016-04-23 ENCOUNTER — Emergency Department (HOSPITAL_COMMUNITY)

## 2016-04-23 ENCOUNTER — Inpatient Hospital Stay (HOSPITAL_COMMUNITY)
Admission: EM | Admit: 2016-04-23 | Discharge: 2016-04-24 | DRG: 100 | Disposition: A | Discharge status: Hospice | Attending: Family Medicine | Admitting: Family Medicine

## 2016-04-23 DIAGNOSIS — F79 Unspecified intellectual disabilities: Secondary | ICD-10-CM | POA: Diagnosis present

## 2016-04-23 DIAGNOSIS — N179 Acute kidney failure, unspecified: Secondary | ICD-10-CM

## 2016-04-23 DIAGNOSIS — G40909 Epilepsy, unspecified, not intractable, without status epilepticus: Principal | ICD-10-CM | POA: Diagnosis present

## 2016-04-23 DIAGNOSIS — I10 Essential (primary) hypertension: Secondary | ICD-10-CM | POA: Diagnosis present

## 2016-04-23 DIAGNOSIS — Z923 Personal history of irradiation: Secondary | ICD-10-CM

## 2016-04-23 DIAGNOSIS — K219 Gastro-esophageal reflux disease without esophagitis: Secondary | ICD-10-CM | POA: Diagnosis present

## 2016-04-23 DIAGNOSIS — Z8249 Family history of ischemic heart disease and other diseases of the circulatory system: Secondary | ICD-10-CM

## 2016-04-23 DIAGNOSIS — R918 Other nonspecific abnormal finding of lung field: Secondary | ICD-10-CM

## 2016-04-23 DIAGNOSIS — F0390 Unspecified dementia without behavioral disturbance: Secondary | ICD-10-CM | POA: Diagnosis present

## 2016-04-23 DIAGNOSIS — R569 Unspecified convulsions: Secondary | ICD-10-CM

## 2016-04-23 DIAGNOSIS — Z87891 Personal history of nicotine dependence: Secondary | ICD-10-CM

## 2016-04-23 DIAGNOSIS — J9621 Acute and chronic respiratory failure with hypoxia: Secondary | ICD-10-CM | POA: Diagnosis present

## 2016-04-23 DIAGNOSIS — G9340 Encephalopathy, unspecified: Secondary | ICD-10-CM | POA: Diagnosis not present

## 2016-04-23 DIAGNOSIS — C3490 Malignant neoplasm of unspecified part of unspecified bronchus or lung: Secondary | ICD-10-CM

## 2016-04-23 DIAGNOSIS — J9601 Acute respiratory failure with hypoxia: Secondary | ICD-10-CM | POA: Diagnosis not present

## 2016-04-23 LAB — BLOOD GAS, ARTERIAL
Acid-base deficit: 1.1 mmol/L (ref 0.0–2.0)
BICARBONATE: 22.9 meq/L (ref 20.0–24.0)
DRAWN BY: 295031
O2 CONTENT: 10 L/min
O2 Saturation: 87.4 %
PCO2 ART: 38 mmHg (ref 35.0–45.0)
PH ART: 7.397 (ref 7.350–7.450)
Patient temperature: 98.6
TCO2: 19.9 mmol/L (ref 0–100)
pO2, Arterial: 56.1 mmHg — ABNORMAL LOW (ref 80.0–100.0)

## 2016-04-23 LAB — CBC
HEMATOCRIT: 49.5 % (ref 39.0–52.0)
Hemoglobin: 15.7 g/dL (ref 13.0–17.0)
MCH: 29.9 pg (ref 26.0–34.0)
MCHC: 31.7 g/dL (ref 30.0–36.0)
MCV: 94.3 fL (ref 78.0–100.0)
PLATELETS: 152 10*3/uL (ref 150–400)
RBC: 5.25 MIL/uL (ref 4.22–5.81)
RDW: 13.8 % (ref 11.5–15.5)
WBC: 14.9 10*3/uL — ABNORMAL HIGH (ref 4.0–10.5)

## 2016-04-23 LAB — CBC WITH DIFFERENTIAL/PLATELET
Basophils Absolute: 0 10*3/uL (ref 0.0–0.1)
Basophils Relative: 0 %
EOS ABS: 0 10*3/uL (ref 0.0–0.7)
EOS PCT: 0 %
HCT: 46.1 % (ref 39.0–52.0)
Hemoglobin: 15 g/dL (ref 13.0–17.0)
LYMPHS ABS: 0.6 10*3/uL — AB (ref 0.7–4.0)
LYMPHS PCT: 4 %
MCH: 29.8 pg (ref 26.0–34.0)
MCHC: 32.5 g/dL (ref 30.0–36.0)
MCV: 91.5 fL (ref 78.0–100.0)
MONO ABS: 1.5 10*3/uL — AB (ref 0.1–1.0)
MONOS PCT: 9 %
Neutro Abs: 15 10*3/uL — ABNORMAL HIGH (ref 1.7–7.7)
Neutrophils Relative %: 87 %
PLATELETS: 170 10*3/uL (ref 150–400)
RBC: 5.04 MIL/uL (ref 4.22–5.81)
RDW: 14 % (ref 11.5–15.5)
WBC: 17.1 10*3/uL — ABNORMAL HIGH (ref 4.0–10.5)

## 2016-04-23 LAB — COMPREHENSIVE METABOLIC PANEL
ALBUMIN: 4.4 g/dL (ref 3.5–5.0)
ALT: 15 U/L — AB (ref 17–63)
AST: 23 U/L (ref 15–41)
Alkaline Phosphatase: 59 U/L (ref 38–126)
Anion gap: 14 (ref 5–15)
BUN: 18 mg/dL (ref 6–20)
CHLORIDE: 107 mmol/L (ref 101–111)
CO2: 21 mmol/L — AB (ref 22–32)
CREATININE: 1.46 mg/dL — AB (ref 0.61–1.24)
Calcium: 9.5 mg/dL (ref 8.9–10.3)
GFR calc Af Amer: 56 mL/min — ABNORMAL LOW (ref >60)
GFR, EST NON AFRICAN AMERICAN: 48 mL/min — AB (ref >60)
GLUCOSE: 147 mg/dL — AB (ref 65–99)
POTASSIUM: 4.3 mmol/L (ref 3.5–5.1)
Sodium: 142 mmol/L (ref 135–145)
Total Bilirubin: 0.4 mg/dL (ref 0.3–1.2)
Total Protein: 7.2 g/dL (ref 6.5–8.1)

## 2016-04-23 LAB — URINALYSIS, ROUTINE W REFLEX MICROSCOPIC
BILIRUBIN URINE: NEGATIVE
Glucose, UA: NEGATIVE mg/dL
KETONES UR: NEGATIVE mg/dL
Leukocytes, UA: NEGATIVE
NITRITE: NEGATIVE
PROTEIN: 30 mg/dL — AB
Specific Gravity, Urine: 1.017 (ref 1.005–1.030)
pH: 5 (ref 5.0–8.0)

## 2016-04-23 LAB — URINE MICROSCOPIC-ADD ON: WBC UA: NONE SEEN WBC/hpf (ref 0–5)

## 2016-04-23 LAB — VALPROIC ACID LEVEL

## 2016-04-23 MED ORDER — VALPROATE SODIUM 500 MG/5ML IV SOLN
1000.0000 mg | Freq: Two times a day (BID) | INTRAVENOUS | Status: DC
  Administered 2016-04-24 (×2): 1000 mg via INTRAVENOUS
  Filled 2016-04-23 (×3): qty 10

## 2016-04-23 MED ORDER — DEXTROSE-NACL 5-0.45 % IV SOLN
INTRAVENOUS | Status: DC
  Administered 2016-04-23: 23:00:00 via INTRAVENOUS

## 2016-04-23 MED ORDER — RISPERIDONE 0.5 MG PO TABS
1.0000 mg | ORAL_TABLET | Freq: Every day | ORAL | Status: DC
  Filled 2016-04-23: qty 2

## 2016-04-23 MED ORDER — LORAZEPAM 2 MG/ML IJ SOLN
2.0000 mg | Freq: Once | INTRAMUSCULAR | Status: AC
  Administered 2016-04-23: 2 mg via INTRAVENOUS
  Filled 2016-04-23: qty 1

## 2016-04-23 MED ORDER — LACOSAMIDE 50 MG PO TABS: 200.0000 mg | ORAL_TABLET | Freq: Two times a day (BID) | ORAL | Status: DC

## 2016-04-23 MED ORDER — DEXTROSE 5 % IV SOLN
1000.0000 mg | Freq: Once | INTRAVENOUS | Status: AC
  Administered 2016-04-23: 1000 mg via INTRAVENOUS
  Filled 2016-04-23: qty 10

## 2016-04-23 MED ORDER — ONDANSETRON HCL 4 MG PO TABS: 4.0000 mg | ORAL_TABLET | Freq: Four times a day (QID) | ORAL | Status: DC | PRN

## 2016-04-23 MED ORDER — LORAZEPAM 2 MG/ML IJ SOLN: 2.0000 mg | INTRAMUSCULAR | Status: DC | PRN

## 2016-04-23 MED ORDER — ACETAMINOPHEN 500 MG PO TABS: 500.0000 mg | ORAL_TABLET | ORAL | Status: DC | PRN

## 2016-04-23 MED ORDER — DONEPEZIL HCL 10 MG PO TABS
10.0000 mg | ORAL_TABLET | Freq: Every day | ORAL | Status: DC
  Filled 2016-04-23: qty 1

## 2016-04-23 MED ORDER — POLYETHYLENE GLYCOL 3350 17 G PO PACK: 17.0000 g | PACK | Freq: Every day | ORAL | Status: DC | PRN

## 2016-04-23 MED ORDER — BISACODYL 5 MG PO TBEC: 5.0000 mg | DELAYED_RELEASE_TABLET | Freq: Every day | ORAL | Status: DC | PRN

## 2016-04-23 MED ORDER — VALPROATE SODIUM 500 MG/5ML IV SOLN
1000.0000 mg | Freq: Two times a day (BID) | INTRAVENOUS | Status: DC
  Filled 2016-04-23 (×2): qty 10

## 2016-04-23 MED ORDER — LORAZEPAM 1 MG PO TABS
2.0000 mg | ORAL_TABLET | Freq: Every day | ORAL | Status: DC
  Filled 2016-04-23: qty 2

## 2016-04-23 MED ORDER — THIAMINE HCL 100 MG/ML IJ SOLN
100.0000 mg | Freq: Every day | INTRAMUSCULAR | Status: DC
  Administered 2016-04-23: 100 mg via INTRAVENOUS
  Filled 2016-04-23: qty 2

## 2016-04-23 MED ORDER — FLUTICASONE PROPIONATE 50 MCG/ACT NA SUSP
2.0000 | Freq: Every day | NASAL | Status: DC
  Administered 2016-04-24: 2 via NASAL
  Filled 2016-04-23: qty 16

## 2016-04-23 MED ORDER — LEVETIRACETAM ER 500 MG PO TB24: 1500.0000 mg | ORAL_TABLET | Freq: Two times a day (BID) | ORAL | Status: DC

## 2016-04-23 MED ORDER — DEXTROSE-NACL 5-0.45 % IV SOLN: INTRAVENOUS | Status: DC

## 2016-04-23 MED ORDER — SODIUM CHLORIDE 0.9 % IV SOLN
1.5000 g | Freq: Four times a day (QID) | INTRAVENOUS | Status: DC
  Administered 2016-04-24 (×2): 1.5 g via INTRAVENOUS
  Filled 2016-04-23 (×6): qty 1.5

## 2016-04-23 MED ORDER — TRAZODONE HCL 50 MG PO TABS: 25.0000 mg | ORAL_TABLET | Freq: Every evening | ORAL | Status: DC | PRN

## 2016-04-23 MED ORDER — SODIUM CHLORIDE 0.9 % IV SOLN
1500.0000 mg | Freq: Two times a day (BID) | INTRAVENOUS | Status: DC
  Administered 2016-04-23 – 2016-04-24 (×2): 1500 mg via INTRAVENOUS
  Filled 2016-04-23 (×5): qty 15

## 2016-04-23 MED ORDER — DIAZEPAM 10 MG RE GEL: 5.0000 mg | Freq: Once | RECTAL | Status: DC | PRN

## 2016-04-23 MED ORDER — LORAZEPAM 2 MG/ML IJ SOLN: 1.0000 mg | Freq: Once | INTRAMUSCULAR | Status: DC

## 2016-04-23 MED ORDER — ENOXAPARIN SODIUM 30 MG/0.3ML ~~LOC~~ SOLN
30.0000 mg | SUBCUTANEOUS | Status: DC
  Administered 2016-04-24: 30 mg via SUBCUTANEOUS
  Filled 2016-04-23: qty 0.3

## 2016-04-23 MED ORDER — SODIUM CHLORIDE 0.9 % IV BOLUS (SEPSIS)
500.0000 mL | Freq: Once | INTRAVENOUS | Status: AC
  Administered 2016-04-23: 500 mL via INTRAVENOUS

## 2016-04-23 MED ORDER — LACOSAMIDE 200 MG/20ML IV SOLN
200.0000 mg | Freq: Two times a day (BID) | INTRAVENOUS | Status: DC
  Administered 2016-04-23 – 2016-04-24 (×2): 200 mg via INTRAVENOUS
  Filled 2016-04-23 (×4): qty 20

## 2016-04-23 MED ORDER — DIVALPROEX SODIUM ER 500 MG PO TB24
500.0000 mg | ORAL_TABLET | Freq: Two times a day (BID) | ORAL | Status: DC
  Filled 2016-04-23: qty 1

## 2016-04-23 MED ORDER — ONDANSETRON HCL 4 MG/2ML IJ SOLN: 4.0000 mg | Freq: Four times a day (QID) | INTRAMUSCULAR | Status: DC | PRN

## 2016-04-23 MED ORDER — SODIUM CHLORIDE 0.9% FLUSH
3.0000 mL | Freq: Two times a day (BID) | INTRAVENOUS | Status: DC
  Administered 2016-04-24: 3 mL via INTRAVENOUS

## 2016-04-23 MED ORDER — HYDROCODONE-HOMATROPINE 5-1.5 MG/5ML PO SYRP: 5.0000 mL | ORAL_SOLUTION | Freq: Four times a day (QID) | ORAL | Status: DC | PRN

## 2016-04-23 NOTE — ED Provider Notes (Signed)
[facility] DEPT Provider Note   CSN: [id number] Arrival date & time: 04/23/16  1225     History   Chief Complaint Chief Complaint  Patient presents with  . Seizures    HPI Marco [name] is a 65 y.o. male.   Seizures    Patient is a resident of a group home. According to the EMS report the staff members witnessed the patient having a seizure this morning. EMS was called and the patient was given 2.5 mg of Versed after he had another witnessed seizure by EMS. Patient has a history of seizures. He normally takes Depakote, Vimpat, and Keppra. The patient is currently unresponsive and is unable to provide any further history.  Past Medical History:  Diagnosis Date  . DEMENTIA   . Depression   . Developmental disability    developmentaly delayed  . Encounter for antineoplastic immunotherapy 04/05/2015  . GERD (gastroesophageal reflux disease)   . Hx of radiation therapy 11/07/13-12/24/13   lung,60Gy/88f  . Hypertension    no medications, no documented history per caregiver at preadmission  . Lung cancer ([place]   . Mental disorder    sczizophrenia;moderate retardation  . Mental retardation   . Seizure disorder ([place]   . Seizures ([facility]     Patient Active Problem List   Diagnosis Date Noted  . Protein-calorie malnutrition, severe ([place] 01/28/2016  . Altered mental status   . Post-ictal confusion   . Acute encephalopathy 01/25/2016  . Squamous cell carcinoma of lung ([place] 01/25/2016  . Seizure disorder ([place] 01/25/2016  . Seizure ([place] 08/22/2015  . Malnutrition of moderate degree 07/02/2015  . Sepsis ([place] 07/01/2015  . UTI (urinary tract infection) 07/01/2015  . ARF (acute renal failure) ([place] 07/01/2015  . Dementia without behavioral disturbance 07/01/2015  . Leukocytosis 07/01/2015  . Hypokalemia 07/01/2015  . Hypernatremia 07/01/2015  . Convulsions/seizures ([place] 05/20/2015  . Acute respiratory failure with hypoxia (HR[place]   . Squamous cell lung cancer ([place]  09/10/2013  . IQ 20-34 (severe mental retardation) 10/29/2012    Past Surgical History:  Procedure Laterality Date  . CATARACT EXTRACTION W/PHACO  07/12/2011   Procedure: CATARACT EXTRACTION PHACO AND INTRAOCULAR LENS PLACEMENT (IOC);  Surgeon: GAdonis Brook  [place];  Service: Ophthalmology;  Laterality: Left;  . EYE SURGERY     L eye  . VIDEO BRONCHOSCOPY Bilateral 10/01/2013   Procedure: VIDEO BRONCHOSCOPY WITHOUT FLUORO;  Surgeon: RCollene Gobble MD;  [place]: [place]  Service: Cardiopulmonary;  Laterality: Bilateral;       Home Medications    Prior to Admission medications   Medication Sig Start Date End Date Taking? Authorizing Provider  acetaminophen (TYLENOL) 500 MG tablet Take 500 mg by mouth every 4 (four) hours as needed for mild pain or headache.    Yes Historical Provider, MD  bisacodyl (DULCOLAX) 5 MG EC tablet Take 5 mg by mouth daily as needed for mild constipation.    Yes Historical Provider, MD  diazepam (DIASTAT ACUDIAL) 10 MG GEL Place 5 mg rectally once as needed for seizure.   Yes Historical Provider, MD  divalproex (DEPAKOTE ER) 500 MG 24 hr tablet Take 1 tablet (500 mg total) by mouth 2 (two) times daily. 02/07/16  Yes AMelvenia Beam MD  donepezil (ARICEPT) 10 MG tablet Take 10 mg by mouth at bedtime.    Yes Historical Provider, MD  fluticasone (FLONASE) 50 MCG/ACT nasal spray Place 2 sprays into both nostrils daily.   Yes Historical Provider, MD  HYDROcodone-homatropine (  HYCODAN) 5-1.5 MG/5ML syrup Take 5 mLs by mouth every 6 (six) hours as needed for cough. 04/13/16  Yes Curt Bears, MD  ketoconazole (NIZORAL) 2 % cream Apply 1 application topically 2 (two) times daily. Pt applies to buttocks.   Yes Historical Provider, MD  lacosamide (VIMPAT) 200 MG TABS tablet Take 200 mg by mouth 2 (two) times daily.   Yes Historical Provider, MD  levETIRAcetam (KEPPRA XR) 500 MG 24 hr tablet Take 3 tablets (1,500 mg total) by mouth 2 (two) times daily.  02/07/16  Yes Melvenia Beam, MD  loperamide (IMODIUM) 2 MG capsule Take 2 mg by mouth every 4 (four) hours as needed for diarrhea or loose stools.   Yes Historical Provider, MD  LORazepam (ATIVAN) 1 MG tablet Take 2 mg by mouth at bedtime.    Yes Historical Provider, MD  omeprazole (PRILOSEC) 20 MG capsule Take 20 mg by mouth 2 (two) times daily.   Yes Historical Provider, MD  polyethylene glycol (MIRALAX / GLYCOLAX) packet Take 17 g by mouth daily as needed for mild constipation or moderate constipation.   Yes Historical Provider, MD  prednisoLONE acetate (PRED FORTE) 1 % ophthalmic suspension Place 1 drop into the right eye 4 (four) times daily.   Yes Historical Provider, MD  prochlorperazine (COMPAZINE) 10 MG tablet Take 10 mg by mouth every 6 (six) hours as needed for nausea or vomiting.   Yes Historical Provider, MD  risperiDONE (RISPERDAL) 1 MG tablet Take 1 tablet (1 mg total) by mouth at bedtime. 08/25/15  Yes Theodis Blaze, MD  traZODone (DESYREL) 50 MG tablet Take 25 mg by mouth at bedtime as needed for sleep.   Yes Historical Provider, MD    Family History Family History  Problem Relation Age of Onset  . Hypertension Mother   . Hypertension Father     Social History Social History  Substance Use Topics  . Smoking status: Former Smoker    Packs/day: 0.50    Years: 35.00    Types: Cigarettes    Quit date: 09/07/2013  . Smokeless tobacco: Never Used  . Alcohol use No     Allergies   Review of patient's allergies indicates no known allergies.   Review of Systems Review of Systems  Unable to perform ROS: Mental status change  Neurological: Positive for seizures.     Physical Exam Updated Vital Signs BP (!) 123/104 (BP Location: Left Arm)   Pulse (!) 128   Temp 99.1 F (37.3 C) (Oral)   Resp 24   SpO2 92%   Physical Exam  Constitutional: No distress.  HENT:  Head: Normocephalic.  Right Ear: External ear normal.  Left Ear: External ear normal.  Bruising  noted around the right eye  Eyes: Conjunctivae are normal. Right eye exhibits no discharge. Left eye exhibits no discharge. No scleral icterus.  Pupils are unequal but appears patient had prior cataract surgery  Neck: Neck supple. No tracheal deviation present.  Cardiovascular: Regular rhythm and intact distal pulses.  Tachycardia present.   Pulmonary/Chest: Effort normal. No stridor. No respiratory distress. He has no wheezes. He has rales.  Sonorous respirations, rhonchi noted on exam  Abdominal: Soft. Bowel sounds are normal. He exhibits no distension. There is no tenderness. There is no rebound and no guarding.  Musculoskeletal: He exhibits no edema or tenderness.  Neurological: He has normal strength. No cranial nerve deficit (no facial droop, ) or sensory deficit. He displays no seizure activity.  Patient responds minimally to  painful stimuli, he does not follow commands or answer any questions  Skin: Skin is warm. No rash noted. He is diaphoretic.  Psychiatric: He has a normal mood and affect.  Nursing note and vitals reviewed.    ED Treatments / Results  Labs (all labs ordered are listed, but only abnormal results are displayed) Labs Reviewed  COMPREHENSIVE METABOLIC PANEL - Abnormal; Notable for the following:       Result Value   CO2 21 (*)    Glucose, Bld 147 (*)    Creatinine, Ser 1.46 (*)    ALT 15 (*)    GFR calc non Af Amer 48 (*)    GFR calc Af Amer 56 (*)    All other components within normal limits  CBC WITH DIFFERENTIAL/PLATELET - Abnormal; Notable for the following:    WBC 17.1 (*)    Neutro Abs 15.0 (*)    Lymphs Abs 0.6 (*)    Monocytes Absolute 1.5 (*)    All other components within normal limits  BLOOD GAS, ARTERIAL - Abnormal; Notable for the following:    pO2, Arterial 56.1 (*)    All other components within normal limits  CULTURE, BLOOD (ROUTINE X 2)  CULTURE, BLOOD (ROUTINE X 2)  URINE CULTURE  URINALYSIS, ROUTINE W REFLEX MICROSCOPIC (NOT AT  [facility])    EKG  EKG Interpretation None       Radiology Ct Head Wo Contrast  Result Date: 04/23/2016 CLINICAL DATA:  Witnessed seizure. EXAM: CT HEAD WITHOUT CONTRAST TECHNIQUE: Contiguous axial images were obtained from the base of the skull through the vertex without intravenous contrast. COMPARISON:  Jan 25, 2016 FINDINGS: Brain: No subdural, epidural, or subarachnoid hemorrhage. Ventricles and sulci are unchanged and unremarkable. Cerebellum, brainstem, and basal cisterns are normal. No mass, mass effect, or midline shift. No acute cortical ischemia or infarct. Vascular: No acute abnormality. Skull: Normal Sinuses/Orbits: Normal Other: Normal IMPRESSION: No acute abnormality. Electronically Signed   By: Dorise Bullion III M.D   On: 04/23/2016 14:47   Dg Chest Portable 1 View  Result Date: 04/23/2016 CLINICAL DATA:  Hypoxia.  Seizure.  History of lung carcinoma EXAM: PORTABLE CHEST 1 VIEW COMPARISON:  Jan 28, 2016 chest radiograph and chest CT Jan 25, 2016 FINDINGS: There is persistent left upper lobe volume loss at in the area of known mass. Well-defined mass is not evident on this portable chest radiograph. Lungs elsewhere clear. Heart size and pulmonary vascularity are normal. No adenopathy. There is atherosclerotic calcification in the aorta. No evident bone lesions. IMPRESSION: Volume loss due to apparent mass left upper lobe. Lungs elsewhere clear. Aortic atherosclerosis. No adenopathy appreciable. Electronically Signed   By: Lowella Grip III M.D.   On: 04/23/2016 14:08    Procedures Procedures (including critical care time)  Medications Ordered in ED Medications  thiamine (B-1) injection 100 mg (100 mg Intravenous Given 04/23/16 1406)  sodium chloride 0.9 % bolus 500 mL (500 mLs Intravenous New Bag/Given 04/23/16 1406)     Initial Impression / Assessment and Plan / ED Course  I have reviewed the triage vital signs and the nursing notes.  Pertinent labs & imaging results  that were available during my care of the patient were reviewed by me and considered in my medical decision making (see chart for details).  Clinical Course  Comment By Time  Labs reviewed.  Hypoxia without resp acidosis.  Elevated WBC count Dorie Rank, MD 08/20 1426  No acute head ct findings. CXR with known  mass. Dorie Rank, MD 08/20 [phone]  D/w Dr Venetia Constable.  Will admit to stepdown Dorie Rank, MD 08/20 1540    Patient continues to remain postictal and confused. He has Not had any recurrent seizure activity. He has some repitive movements where he grabs the back of his head and then reaches for his foot but this does not look like seizure activity. Patient's ABG does show a component of hypoxia. Chest x-ray does not show any acute findings. I'm concerned that he may have aspirated. We will continue to monitor closely. Consult the medical service for admission and consult with neurology.  Final Clinical Impressions(s) / ED Diagnoses   Final diagnoses:  Seizure ([place])  Post-ictal state ([place])  Lung mass    New Prescriptions New Prescriptions   No medications on file     Dorie Rank, MD 04/23/16 1544

## 2016-04-23 NOTE — ED Notes (Signed)
I have just given report to Shirlean Mylar, Therapist, sports at [facility]; and I have notified [facility] for transport. He remains agitated, moving all extremities at will.

## 2016-04-23 NOTE — ED Notes (Signed)
He remains awake, non-verbal and in no distress.  Occasional, congested cough persists.

## 2016-04-23 NOTE — ED Notes (Signed)
Note:  Twice (2 x) on my shift, we attempted EKG.  Both were of insufficient quality to be acceptable d/t pt. Movement.

## 2016-04-23 NOTE — ED Notes (Signed)
He is now tolerating rm. Air very well and maintaining his spo2.

## 2016-04-23 NOTE — H&P (Addendum)
History and Physical    [name]:[id number] DOB: Feb 01, 1951 DOA: 04/23/2016  PCP: Leamon Arnt, MD  Patient coming from: SNF  Chief Complaint: confusion  HPI: Marco [name] is a 65 y.o. 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 As per family member this [name] he's been having seizures for the last 3 years that breakthrough medication, he's never been seizure free for more than 6 months. She related that his postictal states her long and that his baseline is compromised due to his cognitive developmental delay.  ED Course: Ms. found to be mildly tachycardic blood pressure stable and saturations were in the 70s so he had to be put on nonrebreather now citing 100%, ABG was done that showed a pH of 7.39/30s 8/56, [place] kidney injury (baseline creatinine less than 1 on May 2017), chest x-ray was done that showed no acute findings.  Review of Systems: As per HPI otherwise 10 point review of systems negative.    Past Medical History:  Diagnosis Date  . DEMENTIA   . Depression   . Developmental disability    developmentaly delayed  . Encounter for antineoplastic immunotherapy 04/05/2015  . GERD (gastroesophageal reflux disease)   . Hx of radiation therapy 11/07/13-12/24/13   lung,60Gy/1f  . Hypertension    no medications, no documented history per caregiver at preadmission  . Lung cancer ([place]   . Mental disorder    sczizophrenia;moderate retardation  . Mental retardation   . Seizure disorder (HAgu[place]   . Seizures ([place]     Past Surgical History:  Procedure Laterality Date  . CATARACT EXTRACTION W/PHACO  07/12/2011   Procedure: CATARACT EXTRACTION PHACO AND  INTRAOCULAR LENS PLACEMENT (IOC);  Surgeon: GAdonis Brook  [facility];  Service: Ophthalmology;  Laterality: Left;  . EYE SURGERY     L eye  . VIDEO BRONCHOSCOPY Bilateral 10/01/2013   Procedure: VIDEO BRONCHOSCOPY WITHOUT FLUORO;  Surgeon: RCollene Gobble MD;  [place] [facility]  Service: Cardiopulmonary;  Laterality: Bilateral;     reports that he quit smoking about 2 years ago. His smoking use included Cigarettes. He has a 17.50 pack-year smoking history. He has never used smokeless tobacco. He reports that he does not drink alcohol or use drugs.  No Known Allergies  Family History  Problem Relation Age of Onset  . Hypertension Mother   . Hypertension Father     Prior to Admission medications   Medication Sig Start Date End Date Taking? Authorizing Provider  acetaminophen (TYLENOL) 500 MG tablet Take 500 mg by mouth every 4 (four) hours as needed for mild pain or headache.    Yes Historical Provider, MD  bisacodyl (DULCOLAX) 5 MG EC tablet Take 5 mg by mouth daily as needed for mild constipation.    Yes Historical Provider, MD  diazepam (DIASTAT ACUDIAL) 10 MG GEL Place 5 mg rectally once as needed for seizure.   Yes Historical Provider, MD  divalproex (DEPAKOTE ER) 500 MG 24 hr tablet Take 1 tablet (500 mg total) by mouth 2 (two) times daily. 02/07/16  Yes AMelvenia Beam MD  donepezil (ARICEPT) 10 MG tablet Take 10 mg by mouth at bedtime.    Yes Historical Provider, MD  fluticasone (FLONASE) 50 MCG/ACT nasal spray Place 2 sprays into both nostrils daily.   Yes Historical Provider, MD  HYDROcodone-homatropine (HYCODAN) 5-1.5 MG/5ML syrup Take 5 mLs by mouth every 6 (six) hours as needed for cough. 04/13/16  Yes Curt Bears, MD  ketoconazole (NIZORAL) 2 % cream Apply 1 application topically 2 (two) times daily. Pt applies to buttocks.   Yes Historical Provider, MD  lacosamide (VIMPAT) 200 MG TABS tablet Take 200 mg by mouth 2 (two) times daily.   Yes Historical Provider, MD    levETIRAcetam (KEPPRA XR) 500 MG 24 hr tablet Take 3 tablets (1,500 mg total) by mouth 2 (two) times daily. 02/07/16  Yes Melvenia Beam, MD  loperamide (IMODIUM) 2 MG capsule Take 2 mg by mouth every 4 (four) hours as needed for diarrhea or loose stools.   Yes Historical Provider, MD  LORazepam (ATIVAN) 1 MG tablet Take 2 mg by mouth at bedtime.    Yes Historical Provider, MD  omeprazole (PRILOSEC) 20 MG capsule Take 20 mg by mouth 2 (two) times daily.   Yes Historical Provider, MD  polyethylene glycol (MIRALAX / GLYCOLAX) packet Take 17 g by mouth daily as needed for mild constipation or moderate constipation.   Yes Historical Provider, MD  prednisoLONE acetate (PRED FORTE) 1 % ophthalmic suspension Place 1 drop into the right eye 4 (four) times daily.   Yes Historical Provider, MD  prochlorperazine (COMPAZINE) 10 MG tablet Take 10 mg by mouth every 6 (six) hours as needed for nausea or vomiting.   Yes Historical Provider, MD  risperiDONE (RISPERDAL) 1 MG tablet Take 1 tablet (1 mg total) by mouth at bedtime. 08/25/15  Yes Theodis Blaze, MD  traZODone (DESYREL) 50 MG tablet Take 25 mg by mouth at bedtime as needed for sleep.   Yes Historical Provider, MD    Physical Exam: Vitals:   04/23/16 1236 04/23/16 1243  BP:  (!) 123/104  Pulse:  (!) 128  Resp:  24  Temp:  99.1 F (37.3 C)  TempSrc:  Oral  SpO2: 97% 92%      Constitutional: NAD, calm,  Vitals:   04/23/16 1236 04/23/16 1243  BP:  (!) 123/104  Pulse:  (!) 128  Resp:  24  Temp:  99.1 F (37.3 C)  TempSrc:  Oral  SpO2: 97% 92%   Eyes: PERRL, lids and conjunctivae normal ENMT: Mucous membranes are moist.Poor oral hygiene. Respiratory: Good air movement and no wheezing crackles on the right normal respiratory effort. Cardiovascular: Regular rate and rhythm, no murmurs / rubs / gallops. No extremity edema. 2+ pedal pulses. No carotid bruits.  Abdomen: no tenderness, no masses palpated. No hepatosplenomegaly. Bowel sounds  positive.  Musculoskeletal: no clubbing / cyanosis. No joint deformity upper and lower extremities. Good ROM, no contractures. Normal muscle tone.  Skin: no rashes, lesions, ulcers. No induration Neurologic: CN 2-12 grossly intact. Sensation intact, DTR normal. Strength 5/5 in all 4.    Labs on Admission: I have personally reviewed following labs and imaging studies  CBC:  Recent Labs Lab 04/23/16 1336  WBC 17.1*  NEUTROABS 15.0*  HGB 15.0  HCT 46.1  MCV 91.5  PLT 188   Basic Metabolic Panel:  Recent Labs Lab 04/23/16 1336  NA 142  K 4.3  CL 107  CO2 21*  GLUCOSE 147*  BUN 18  CREATININE 1.46*  CALCIUM 9.5   GFR: CrCl cannot be calculated (Unknown ideal weight.). Liver Function Tests:  Recent Labs Lab 04/23/16 1336  AST 23  ALT 15*  ALKPHOS 59  BILITOT 0.4  PROT 7.2  ALBUMIN 4.4   No results for input(s): LIPASE, AMYLASE in the last 168 hours. No results for input(s): AMMONIA in the last 168 hours. Coagulation Profile: No results for input(s): INR, PROTIME in the last 168 hours. Cardiac Enzymes: No results for input(s): CKTOTAL, CKMB, CKMBINDEX, TROPONINI in the last 168 hours. BNP (last 3 results) No results for input(s): PROBNP in the last 8760 hours. HbA1C: No results for input(s): HGBA1C in the last 72 hours. CBG: No results for input(s): GLUCAP in the last 168 hours. Lipid Profile: No results for input(s): CHOL, HDL, LDLCALC, TRIG, CHOLHDL, LDLDIRECT in the last 72 hours. Thyroid Function Tests: No results for input(s): TSH, T4TOTAL, FREET4, T3FREE, THYROIDAB in the last 72 hours. Anemia Panel: No results for input(s): VITAMINB12, FOLATE, FERRITIN, TIBC, IRON, RETICCTPCT in the last 72 hours. Urine analysis:    Component Value Date/Time   COLORURINE YELLOW 01/25/2016 1319   APPEARANCEUR CLOUDY (A) 01/25/2016 1319   LABSPEC 1.020 01/25/2016 1319   LABSPEC 1.020 03/16/2015 1520   PHURINE 5.0 01/25/2016 1319   GLUCOSEU NEGATIVE 01/25/2016  1319   GLUCOSEU Negative 03/16/2015 1520   HGBUR NEGATIVE 01/25/2016 1319   BILIRUBINUR NEGATIVE 01/25/2016 1319   BILIRUBINUR Negative 03/16/2015 1520   KETONESUR 15 (A) 01/25/2016 1319   PROTEINUR NEGATIVE 01/25/2016 1319   UROBILINOGEN 0.2 06/27/2015 1543   UROBILINOGEN 0.2 03/16/2015 1520   NITRITE NEGATIVE 01/25/2016 1319   LEUKOCYTESUR NEGATIVE 01/25/2016 1319   LEUKOCYTESUR Negative 03/16/2015 1520   Sepsis Labs:  '@LABRCNTIP'$ (procalcitonin:4,lacticidven:4) )No results found for this or any previous visit (from the past 240 hour(s)).   Radiological Exams on Admission: Ct Head Wo Contrast  Result Date: 04/23/2016 CLINICAL DATA:  Witnessed seizure. EXAM: CT HEAD WITHOUT CONTRAST TECHNIQUE: Contiguous axial images were obtained from the base of the skull through the vertex without intravenous contrast. COMPARISON:  Jan 25, 2016 FINDINGS: Brain: No subdural, epidural, or subarachnoid hemorrhage. Ventricles and sulci are unchanged and unremarkable. Cerebellum, brainstem, and basal cisterns are normal. No mass, mass effect, or midline shift. No acute cortical ischemia or infarct. Vascular: No acute abnormality. Skull: Normal Sinuses/Orbits: Normal Other: Normal IMPRESSION: No acute abnormality. Electronically Signed   By: Dorise Bullion III M.D   On: 04/23/2016 14:47   Dg Chest Portable 1 View  Result Date: 04/23/2016 CLINICAL DATA:  Hypoxia.  Seizure.  History of lung carcinoma EXAM: PORTABLE CHEST 1 VIEW COMPARISON:  Jan 28, 2016 chest radiograph and chest CT Jan 25, 2016 FINDINGS: There is persistent left upper lobe volume loss at in the area of known mass. Well-defined mass is not evident on this portable chest radiograph. Lungs elsewhere clear. Heart size and pulmonary vascularity are normal. No adenopathy. There is atherosclerotic calcification in the aorta. No evident bone lesions. IMPRESSION: Volume loss due to apparent mass left upper lobe. Lungs elsewhere clear. Aortic  atherosclerosis. No adenopathy appreciable. Electronically Signed   By: Lowella Grip III M.D.   On: 04/23/2016 14:08    EKG: Independently reviewed. Poor quality EKG we'll have to repeat.  Assessment/Plan Acute encephalopathy likely due to Seizure disorder [facility]): The ED has consulted neurology recommended 24 hr EEG, to rule out status, I think his encephalopathy is likely due to his prolonged postictal state.  As speaking with a healthcare power of attorney he's never been seizure free for the last 3 years he has at least 1 seizure every 6 months, he is  on 3 seizures medications which he takes religiously at the skilled nursing facility and he also have Ativan suppository for breakthrough seizures. As of this point he is making Purposeful for movements is unlikely that he is in status. Will resume his anti-epileptic drugs will get a Depakote level Ativan when necessary for seizures. Check an MRI to rule out metastatic disease which can be continued into his recurrent seizures.  Acute respiratory failure with hypoxia (HCC) question due to aspiration pneumonia He was significantly hypoxic no setting 100% on nonrebreather, he does have some crackles on physical exam although his chest x-ray is negative, I'll start him empirically on Unasyn for aspiration pneumonia. Placement by mouth, this is likely due to seizure event.    AKI (acute kidney injury) ([place]) Likely prerenal in etiology check urinary sodium urinary creatinine start him on IV fluids recheck a basic metabolic panel in the morning.  History of small cell lung cancer: Lopid oncology as an outpatient.  DVT prophylaxis: lovenox Code Status: full Family Communication: POA Zakiyyah Jones Disposition Plan: unable to determine Consults called: neurology Admission status: inpatient, SDU   Charlynne Cousins MD [facility] Pager [phone]  If 7PM-7AM, please contact night-coverage [email or web address] Password  [email or web address]  04/23/2016, 4:12 PM

## 2016-04-23 NOTE — ED Triage Notes (Addendum)
Pt from group home via EMS- Per EMS, pt had witnessed seizure with staff and then another with EMS. Pt was given 2.5 mg Versed on scene. Pt is post-ictal. Pt has hx of seizures. Pt received his daily Keppra, Depakote and Vimpat this am. Pt has hx of Lung CA

## 2016-04-23 NOTE — ED Notes (Addendum)
[name] [phone]- Pts group home manager- wants to be contacted when pt disposition is set.  [facility] ([facility]  [address] [place], [place] [address]

## 2016-04-23 NOTE — Consult Note (Signed)
Neurology Consultation Reason for Consult: Seizures Referring Physician: Hillard Danker  CC: Seizures  History is obtained from: Medical record  HPI: Marco [name] is a 65 y.o. male with a history of seizures, severe MR, frequent prolonged postictal states following seizures who presents with multiple seizures today with prolonged postictal state. He apparently was enrolled in hospice, but recently has exited hospice with the plan to consider more chemotherapy.  ROS: Unable to obtain due to altered mental status.   Past Medical History:  Diagnosis Date  . DEMENTIA   . Depression   . Developmental disability    developmentaly delayed  . Encounter for antineoplastic immunotherapy 04/05/2015  . GERD (gastroesophageal reflux disease)   . Hx of radiation therapy 11/07/13-12/24/13   lung,60Gy/24f  . Hypertension    no medications, no documented history per caregiver at preadmission  . [name]   . Mental disorder    sczizophrenia;moderate retardation  . Mental retardation   . Seizure disorder ([facility]   . Seizures ([name]      Family History  Problem Relation Age of Onset  . Hypertension Mother   . Hypertension Father      Social History:  reports that he quit smoking about 2 years ago. His smoking use included Cigarettes. He has a 17.50 pack-year smoking history. He has never used smokeless tobacco. He reports that he does not drink alcohol or use drugs.   Exam: Current vital signs: BP (!) 123/104 (BP Location: Left Arm)   Pulse (!) 128   Temp 99.1 F (37.3 C) (Oral)   Resp 24   SpO2 92%  Vital signs in last 24 hours: Temp:  [99.1 F (37.3 C)] 99.1 F (37.3 C) (08/20 1243) Pulse Rate:  [128] 128 (08/20 1243) Resp:  [24] 24 (08/20 1243) BP: (123)/(104) 123/104 (08/20 1243) SpO2:  [92 %-97 %] 92 % (08/20 1243)   Physical Exam  Constitutional: Appears well-developed and well-nourished.  Psych: Does not interact Eyes: No scleral injection HENT: No OP  obstrucion Head: Normocephalic.  Cardiovascular: Normal rate and regular rhythm.  Respiratory: Effort normal and breath sounds normal to anterior ascultation GI: Soft.  No distension. There is no tenderness.  Skin: WDI  Throughout the exam, the patient is making: I purposeful movements grabbing at lines and bedding  Neuro: Mental Status: Patient has eyes open, but does not follow commands or engage examiner. Cranial Nerves: II: Visual Fields are difficult to test due to his frequent nonpurposeful movements, not clear whether he placed threat from either direction. Pupils are equal, round, and reactive to light.   III,IV, VI: Eyes are midline, due to his frequent movement is difficult to test V: Facial sensation is symmetric to temperature VII: Facial movement is symmetric.  VIII: hearing is intact to voice X: Uvula elevates symmetrically XI: Shoulder shrug is symmetric. XII: tongue is midline without atrophy or fasciculations.  Motor: He appears to move all extremities well Sensory: Response to noxious stimuli in all 4 extremities Cerebellar: Patient does not comply    I have reviewed labs in epic and the results pertinent to this consultation are: Valproic acid level less than 10  I have reviewed the images obtained: CT head- negative  Impression: 65year old male with a history of seizures with prolonged postictal states. I suspect that his current state is reflective of that.  Recommendations: 1) IV depacon '1000mg'$  x 1.  2) continue keppra 1500 mg 3) continue depacon '1000mg'$  BID 4) continue vimpat '200mg'$  BID 5) depakote  level in the am 6) EEG   Roland Rack, MD [facility] [phone]  If 7pm- 7am, please page neurology on call as listed in [place].

## 2016-04-24 LAB — BASIC METABOLIC PANEL WITH GFR
Anion gap: 7 (ref 5–15)
BUN: 13 mg/dL (ref 6–20)
CO2: 28 mmol/L (ref 22–32)
Calcium: 8.7 mg/dL — ABNORMAL LOW (ref 8.9–10.3)
Chloride: 107 mmol/L (ref 101–111)
Creatinine, Ser: 1.03 mg/dL (ref 0.61–1.24)
GFR calc Af Amer: >60 mL/min
GFR calc non Af Amer: >60 mL/min
Glucose, Bld: 103 mg/dL — ABNORMAL HIGH (ref 65–99)
Potassium: 3.8 mmol/L (ref 3.5–5.1)
Sodium: 142 mmol/L (ref 135–145)

## 2016-04-24 LAB — CBC
HCT: 44.6 % (ref 39.0–52.0)
Hemoglobin: 14.1 g/dL (ref 13.0–17.0)
MCH: 29.5 pg (ref 26.0–34.0)
MCHC: 31.6 g/dL (ref 30.0–36.0)
MCV: 93.3 fL (ref 78.0–100.0)
PLATELETS: 137 10*3/uL — AB (ref 150–400)
RBC: 4.78 MIL/uL (ref 4.22–5.81)
RDW: 13.8 % (ref 11.5–15.5)
WBC: 13.1 10*3/uL — ABNORMAL HIGH (ref 4.0–10.5)

## 2016-04-24 LAB — URINE CULTURE

## 2016-04-24 LAB — CREATININE, SERUM
Creatinine, Ser: 1.19 mg/dL (ref 0.61–1.24)
GFR calc non Af Amer: >60 mL/min (ref >60)

## 2016-04-24 LAB — MRSA PCR SCREENING: MRSA by PCR: NEGATIVE

## 2016-04-24 MED ORDER — LORAZEPAM 1 MG PO TABS: 2.0000 mg | ORAL_TABLET | Freq: Every day | ORAL | 0 refills | Status: AC

## 2016-04-24 MED ORDER — HYDROCODONE-HOMATROPINE 5-1.5 MG/5ML PO SYRP: 5.0000 mL | ORAL_SOLUTION | Freq: Four times a day (QID) | ORAL | 0 refills | Status: DC | PRN

## 2016-04-24 NOTE — Progress Notes (Signed)
[facility] Work note Patient is currently receiving hospice care for dx of lung cancer. He has hx of seizures and family want to continue to treat. Patient was alert and in good spirits when LCSW made visit. He states, "home" and it appears he is ready to go home. LCSW spoke with POA, Zakiyyah and group home manager, [name] regarding care needs. They are willing to bring him back home when discharge is appropriate. Support offered.  Katherina Right, [place]

## 2016-04-24 NOTE — Progress Notes (Signed)
[id number]- [facility] RN Visit  This is a related, covered GIP admission from 04/23/16 to [facility] diagnosis of Lung Cancer.  Patient is a FULL CODE.  Patient was brought to [facility] ED after staff at the group home where he resides, witnessed a seizure.  [facility] was notified after EMS was activated.  Patient was admitted to [facility] for treatment of seizures and further evaluation by Neurology.  Patient seen in room, resting comfortably in bed.  Caregiver and group home manager, [name] at bedside.  Patient alert and appears comfortable and in no acute distress.  He gestures and nods appropriately.  [name] reports that this is close to the patient's baseline.  Patient is currently on RA with O2 sats and respirations WNL.  He is receiving IVF via a PIV and is receiving scheduled IV Vimpat, Keppra, Depakote. Head CTI was obtained that showed no acute abnormalities, and MRI is currently pending. He was also started on IV Unasyn, empirically for possible aspiration pneumonia.  Patient remains NPO at this time and has not been out of bed.  [name] would like to make sure patient can safely ambulate, prior to returning to the group home as there are many steps for him to climb.  [name] denies the need for any additional DME, prior to hospital discharge.  Left contact information with [name] if any needs arise. [facility] will continue to follow daily and anticipate any discharge needs.  Updated medication list and transfer summary placed on chart.  Please contact with any hospice-related questions or concerns.  Thank you, Freddi Starr RN, [facility] Liaison [phone]

## 2016-04-24 NOTE — Evaluation (Signed)
Physical Therapy Evaluation Patient Details Name: Marco [name] MRN: [id number] DOB: 1951/08/14 Today's Date: 04/24/2016   History of Present Illness  Marco [name] is a 65 y.o. 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  Spoke with Mr. [name] from group home who reports they have had a person shadow him in the past when he has come home after seizures and that he has been followed by [place] for HHPT.    Follow Up Recommendations Home health PT    Equipment Recommendations  None recommended by PT    Recommendations for Other Services       Precautions / Restrictions Precautions Precautions: Fall      Mobility  Bed Mobility Overal bed mobility: Needs Assistance Bed Mobility: Supine to Sit;Sit to Supine     Supine to sit: Mod assist Sit to supine: Supervision   General bed mobility comments: increased time following comands and assist to initiate and follow through with coming to EOB, then to supine able to lift legs and lie down unaided  Transfers Overall transfer level: Needs assistance   Transfers: Sit to/from Stand Sit to Stand: Min assist;Mod assist         General transfer comment: up from EOB impulsively with imbalance and with IV, condom cath and heart monitor; assist for balance, safety and to catch up equipment with patient  Ambulation/Gait Ambulation/Gait assistance: Min assist;Mod assist Ambulation Distance (Feet): 400 Feet Assistive device: None Gait Pattern/deviations: Step-through pattern;Drifts right/left;Scissoring     General Gait Details: leaning  forward throughout and some assist for safety to prevent anterior LOB, assist and cues to slow down, noted cross steps at times with mod A for recovery, attempted to step over line from telemetry and mod A for recovery.   Stairs Stairs: Yes Stairs assistance: +2 safety/equipment Stair Management: One rail Right Number of Stairs: 6 General stair comments: RN in to assist for safety and went up and down 3 steps twice with both hands on one railing.   Wheelchair Mobility    Modified Rankin (Stroke Patients Only)       Balance Overall balance assessment: Needs assistance   Sitting balance-Leahy Scale: Fair Sitting balance - Comments: minguard while reaching to feet to don socks     Standing balance-Leahy Scale: Poor Standing balance comment: anterior bias and needs assist for balance in static standing                             Pertinent Vitals/Pain Pain Assessment: No/denies pain    Home Living Family/patient expects to be discharged to:: Other (Comment) (group home)                      Prior Function Level of Independence: Needs assistance   Gait / Transfers Assistance Needed: was able to walk, negotiate stairs and get in and out of bathtub previously on his own     Comments: spoke with [name], caregiver from group home who reports can have a staff member shadow Mr, Hillery as he has had some instability before when coming home  after having seizures     Hand Dominance        Extremity/Trunk Assessment   Upper Extremity Assessment: Generalized weakness           Lower Extremity Assessment: Generalized weakness         Communication   Communication: Expressive difficulties  Cognition Arousal/Alertness: Awake/alert Behavior During Therapy: Impulsive Overall Cognitive Status: History of cognitive impairments - at baseline                      General Comments      Exercises        Assessment/Plan    PT  Assessment All further PT needs can be met in the next venue of care  PT Diagnosis Abnormality of gait   PT Problem List Decreased balance;Decreased strength;Decreased mobility;Decreased cognition  PT Treatment Interventions     PT Goals (Current goals can be found in the Care Plan section) Acute Rehab PT Goals Patient Stated Goal: To return to group home PT Goal Formulation: All assessment and education complete, DC therapy    Frequency     Barriers to discharge        Co-evaluation               End of Session Equipment Utilized During Treatment: Gait belt Activity Tolerance: Patient tolerated treatment well Patient left: in bed;with call bell/phone within reach           Time: 1338-1405 PT Time Calculation (min) (ACUTE ONLY): 27 min   Charges:   PT Evaluation $PT Eval Moderate Complexity: 1 Procedure PT Treatments $Gait Training: 8-22 mins   PT G CodesReginia Naas 2016/05/19, 2:16 PM  Magda Kiel, [place] 05-19-2016

## 2016-04-24 NOTE — Progress Notes (Signed)
RN attempted to call POA [name] to notify that patient was on unit and settled in, but no answer. RN will attempt to call back.

## 2016-04-24 NOTE — Discharge Instructions (Signed)
Epilepsy °Epilepsy is a disorder in which a person has repeated seizures over time. A seizure is a release of abnormal electrical activity in the brain. Seizures can cause a change in attention, behavior, or the ability to remain awake and alert (altered mental status). Seizures often involve uncontrollable shaking (convulsions).  °Most people with epilepsy lead normal lives. However, people with epilepsy are at an increased risk of falls, accidents, and injuries. Therefore, it is important to begin treatment right away. °CAUSES  °Epilepsy has many possible causes. Anything that disturbs the normal pattern of brain cell activity can lead to seizures. This may include:  °· Head injury. °· Birth trauma. °· High fever as a child. °· Stroke. °· Bleeding into or around the brain. °· Certain drugs. °· Prolonged low oxygen, such as what occurs after CPR efforts. °· Abnormal brain development. °· Certain illnesses, such as meningitis, encephalitis (brain infection), malaria, and other infections. °· An imbalance of nerve signaling chemicals (neurotransmitters).   °SIGNS AND SYMPTOMS  °The symptoms of a seizure can vary greatly from one person to another. Right before a seizure, you may have a warning (aura) that a seizure is about to occur. An aura may include the following symptoms: °· Fear or anxiety. °· Nausea. °· Feeling like the room is spinning (vertigo). °· Vision changes, such as seeing flashing lights or spots. °Common symptoms during a seizure include: °· Abnormal sensations, such as an abnormal smell or a bitter taste in the mouth.   °· Sudden, general body stiffness.   °· Convulsions that involve rhythmic jerking of the face, arm, or leg on one or both sides.   °· Sudden change in consciousness.   °¨ Appearing to be awake but not responding.   °¨ Appearing to be asleep but cannot be awakened.   °· Grimacing, chewing, lip smacking, drooling, tongue biting, or loss of bowel or bladder control. °After a seizure,  you may feel sleepy for a while.  °DIAGNOSIS  °Your health care provider will ask about your symptoms and take a medical history. Descriptions from any witnesses to your seizures will be very helpful in the diagnosis. A physical exam, including a detailed neurological exam, is necessary. Various tests may be done, such as:  °· An electroencephalogram (EEG). This is a painless test of your brain waves. In this test, a diagram is created of your brain waves. These diagrams can be interpreted by a specialist. °· An MRI of the brain.   °· A CT scan of the brain.   °· A spinal tap (lumbar puncture, LP). °· Blood tests to check for signs of infection or abnormal blood chemistry. °TREATMENT  °There is no cure for epilepsy, but it is generally treatable. Once epilepsy is diagnosed, it is important to begin treatment as soon as possible. For most people with epilepsy, seizures can be controlled with medicines. The following may also be used: °· A pacemaker for the brain (vagus nerve stimulator) can be used for people with seizures that are not well controlled by medicine. °· Surgery on the brain. °For some people, epilepsy eventually goes away. °HOME CARE INSTRUCTIONS  °· Follow your health care provider's recommendations on driving and safety in normal activities. °· Get enough rest. Lack of sleep can cause seizures. °· Only take over-the-counter or prescription medicines as directed by your health care provider. Take any prescribed medicine exactly as directed. °· Avoid any known triggers of your seizures. °· Keep a seizure diary. Record what you recall about any seizure, especially any possible trigger.   °· Make   sure the people you live and work with know that you are prone to seizures. They should receive instructions on how to help you. In general, a witness to a seizure should:   Cushion your head and body.   Turn you on your side.   Avoid unnecessarily restraining you.   Not place anything inside your  mouth.   Call for emergency medical help if there is any question about what has occurred.   Follow up with your health care provider as directed. You may need regular blood tests to monitor the levels of your medicine.  SEEK MEDICAL CARE IF:   You develop signs of infection or other illness. This might increase the risk of a seizure.   You seem to be having more frequent seizures.   Your seizure pattern is changing.  SEEK IMMEDIATE MEDICAL CARE IF:   You have a seizure that does not stop after a few moments.   You have a seizure that causes any difficulty in breathing.   You have a seizure that results in a very severe headache.   You have a seizure that leaves you with the inability to speak or use a part of your body.    This information is not intended to replace advice given to you by your health care provider. Make sure you discuss any questions you have with your health care provider.   Document Released: 08/21/2005 Document Revised: 06/11/2013 Document Reviewed: 04/02/2013 Elsevier Interactive Patient Education 2016 [employer].   Seizure, Adult A seizure is abnormal electrical activity in the brain. Seizures usually last from 30 seconds to 2 minutes. There are various types of seizures. Before a seizure, you may have a warning sensation (aura) that a seizure is about to occur. An aura may include the following symptoms:   Fear or anxiety.  Nausea.  Feeling like the room is spinning (vertigo).  Vision changes, such as seeing flashing lights or spots. Common symptoms during a seizure include:  A change in attention or behavior (altered mental status).  Convulsions with rhythmic jerking movements.  Drooling.  Rapid eye movements.  Grunting.  Loss of bladder and bowel control.  Bitter taste in the mouth.  Tongue biting. After a seizure, you may feel confused and sleepy. You may also have an injury resulting from convulsions during the  seizure. HOME CARE INSTRUCTIONS   If you are given medicines, take them exactly as prescribed by your health care provider.  Keep all follow-up appointments as directed by your health care provider.  Do not swim or drive or engage in risky activity during which a seizure could cause further injury to you or others until your health care provider says it is OK.  Get adequate rest.  Teach friends and family what to do if you have a seizure. They should:  Lay you on the ground to prevent a fall.  Put a cushion under your head.  Loosen any tight clothing around your neck.  Turn you on your side. If vomiting occurs, this helps keep your airway clear.  Stay with you until you recover.  Know whether or not you need emergency care. SEEK IMMEDIATE MEDICAL CARE IF:  The seizure lasts longer than 5 minutes.  The seizure is severe or you do not wake up immediately after the seizure.  You have an altered mental status after the seizure.  You are having more frequent or worsening seizures. Someone should drive you to the emergency department or call local emergency  services (911 in [place] MAKE SURE YOU:  Understand these instructions.  Will watch your condition.  Will get help right away if you are not doing well or get worse.   This information is not intended to replace advice given to you by your health care provider. Make sure you discuss any questions you have with your health care provider.   Document Released: 08/18/2000 Document Revised: 09/11/2014 Document Reviewed: 04/02/2013 Elsevier Interactive Patient Education [employer].

## 2016-04-24 NOTE — Progress Notes (Signed)
Subjective:  Marco [name] is resting comfortably in bed. Family members at the bedside. She is very pleased with his progress and feels that he is back to his baseline at this point. Marco [name] is followed by a neurologist at [facility].  Exam: Vitals:   04/24/16 0442 04/24/16 0720  BP: 98/75 (!) 91/58  Pulse: (!) 131 73  Resp: 16 12  Temp: 98.6 F (37 C) 97.7 F (36.5 C)    HEENT-  Normocephalic, no lesions, without obvious abnormality.  Normal external eye and conjunctiva.  Normal TM's bilaterally.  Normal auditory canals and external ears. Normal external nose, mucus membranes and septum.  Normal pharynx. Cardiovascular- regular rate and rhythm, S1, S2 normal, no murmur, click, rub or gallop, pulses palpable throughout   Lungs- chest clear, no wheezing, rales, normal symmetric air entry, Heart exam - S1, S2 normal, no murmur, no gallop, rate regular Abdomen- soft, non-tender; bowel sounds normal; no masses,  no organomegaly Extremities- less then 2 second capillary refill Lymph-no adenopathy palpable Musculoskeletal-no joint tenderness, deformity or swelling Skin-warm and dry, no hyperpigmentation, vitiligo, or suspicious lesions    Gen: In bed, NAD MS: Marco [name] is awake and alert he follows simple commands he has baseline dementia. CN: 2 through 12 are intact. Motor: 5 out of 5 bilaterally. Sensory: Grossly intact. DTR: 1-2+ bilaterally  Pertinent Labs/Diagnostics: Reviewed    Impression:   Marco [name] appears essentially back to his baseline at this point. He has a known history of dementia. There is no evidence of any further seizure activity. He continues on Keppra, Depakote, and Vimpat. Given that Marco [name] appears back to his baseline, it would be reasonable to transfer him back to hospice at this point.   Recommendations:  1. Transfer back to hospice given improvement and no evidence of further seizure activity.   Marco [name] A. Tasia Catchings,  M.D. Neurohospitalist Phone: [phone]   04/24/2016, 9:41 AM

## 2016-04-24 NOTE — Discharge Summary (Signed)
Physician Discharge Summary  Marco [name]:[id number] DOB: [birth year] DOA: 04/23/2016  PCP: Marco Arnt, MD  Admit date: 04/23/2016 Discharge date: 04/24/2016  Admitted From: Group Home Disposition:  [facility] with hospice services  Recommendations for Outpatient Follow-up:  1. Follow up with PCP in 1 weeks 2. Follow up with neurologist in 2 weeks  Discharge Condition: Stable  Brief/Interim Summary: HPI: Marco [name] is a 65 y.o. 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 As per family member this Marco [name] he's been having seizures for the last 3 years that breakthrough medication, he's never been seizure free for more than 6 months. She related that his postictal states her long and that his baseline is compromised due to his cognitive developmental delay.  ED Course: Ms. found to be mildly tachycardic blood pressure stable and saturations were in the 70s so he had to be put on nonrebreather now citing 100%, kidney injury (baseline creatinine less than 1 on May 2017), chest x-ray was done that showed no acute findings. Pt was admitted and empirically given unasyn IV for concern of aspiration, WBC normal, no fever or cough and this was discontinued.  Neurology ordered IV depacon, keppra, vimpat, followed depakote level (<10) and monitored.  He was seen by neurology again 8/21 and he rapidly had returned to baseline and no further seizure activity noted.  He was cleared by neurology to return to group home with hospice care services.    Impression:   Marco [name] appears essentially back to his baseline at this point. He has a known history of dementia. There is no evidence of  any further seizure activity. He continues on Keppra, Depakote, and Vimpat. Given that Artha appears back to his baseline, it would be reasonable to transfer him back to hospice at this point.  Recommendations:  1. Transfer back to hospice given improvement and no evidence of further seizure activity.  Discharge Diagnoses:  Active Problems:   Acute respiratory failure with hypoxia (HCC)   Acute encephalopathy   Seizure disorder (HCC)   AKI (acute kidney injury) ([place])   Acute on chronic respiratory failure with hypoxemia [facility])  Discharge Instructions    Medication List    TAKE these medications   acetaminophen 500 MG tablet Commonly known as:  TYLENOL Take 500 mg by mouth every 4 (four) hours as needed for mild pain or headache.   bisacodyl 5 MG EC tablet Commonly known as:  DULCOLAX Take 5 mg by mouth daily as needed for mild constipation.   diazepam 10 MG Gel Commonly known as:  DIASTAT ACUDIAL Place 5 mg rectally once as needed for seizure.   divalproex 500 MG 24 hr tablet Commonly known as:  DEPAKOTE ER Take 1 tablet (500 mg total) by mouth 2 (two) times daily.   donepezil 10 MG tablet Commonly known as:  ARICEPT Take 10 mg by mouth at bedtime.   fluticasone 50 MCG/ACT nasal spray Commonly known as:  FLONASE Place 2 sprays into both nostrils daily.   HYDROcodone-homatropine 5-1.5 MG/5ML syrup Commonly known as:  HYCODAN Take 5 mLs by mouth every 6 (six) hours as needed for cough.   ketoconazole 2 % cream Commonly known as:  NIZORAL Apply 1  application topically 2 (two) times daily. Pt applies to buttocks.   lacosamide 200 MG Tabs tablet Commonly known as:  VIMPAT Take 200 mg by mouth 2 (two) times daily.   levETIRAcetam 500 MG 24 hr tablet Commonly known as:  KEPPRA XR Take 3 tablets (1,500 mg total) by mouth 2 (two) times daily.   loperamide 2 MG capsule Commonly known as:  IMODIUM Take 2 mg by mouth every 4 (four) hours as needed for diarrhea or  loose stools.   LORazepam 1 MG tablet Commonly known as:  ATIVAN Take 2 tablets (2 mg total) by mouth at bedtime.   omeprazole 20 MG capsule Commonly known as:  PRILOSEC Take 20 mg by mouth 2 (two) times daily.   polyethylene glycol packet Commonly known as:  MIRALAX / GLYCOLAX Take 17 g by mouth daily as needed for mild constipation or moderate constipation.   prednisoLONE acetate 1 % ophthalmic suspension Commonly known as:  PRED FORTE Place 1 drop into the right eye 4 (four) times daily.   prochlorperazine 10 MG tablet Commonly known as:  COMPAZINE Take 10 mg by mouth every 6 (six) hours as needed for nausea or vomiting.   risperiDONE 1 MG tablet Commonly known as:  RISPERDAL Take 1 tablet (1 mg total) by mouth at bedtime.   traZODone 50 MG tablet Commonly known as:  DESYREL Take 25 mg by mouth at bedtime as needed for sleep.      Follow-up Information    [name],[name], MD. Schedule an appointment as soon as possible for a visit in 1 week(s).   Specialty:  Family Medicine Contact information: [address] [place] [address] [phone]        [place]. Schedule an appointment as soon as possible for a visit in 2 week(s).   Why:  Hospital follow up  Contact information: [address]     [address]  [place] [address] [phone]         No Known Allergies   Procedures/Studies: Ct Head Wo Contrast  Result Date: 04/23/2016 CLINICAL DATA:  Witnessed seizure. EXAM: CT HEAD WITHOUT CONTRAST TECHNIQUE: Contiguous axial images were obtained from the base of the skull through the vertex without intravenous contrast. COMPARISON:  Jan 25, 2016 FINDINGS: Brain: No subdural, epidural, or subarachnoid hemorrhage. Ventricles and sulci are unchanged and unremarkable. Cerebellum, brainstem, and basal cisterns are normal. No mass, mass effect, or midline shift. No acute cortical ischemia or infarct.  Vascular: No acute abnormality. Skull: Normal Sinuses/Orbits: Normal Other: Normal IMPRESSION: No acute abnormality. Electronically Signed   By: Dorise Bullion III M.D   On: 04/23/2016 14:47   Dg Chest Portable 1 View  Result Date: 04/23/2016 CLINICAL DATA:  Hypoxia.  Seizure.  History of lung carcinoma EXAM: PORTABLE CHEST 1 VIEW COMPARISON:  Jan 28, 2016 chest radiograph and chest CT Jan 25, 2016 FINDINGS: There is persistent left upper lobe volume loss at in the area of known mass. Well-defined mass is not evident on this portable chest radiograph. Lungs elsewhere clear. Heart size and pulmonary vascularity are normal. No adenopathy. There is atherosclerotic calcification in the aorta. No evident bone lesions. IMPRESSION: Volume loss due to apparent mass left upper lobe. Lungs elsewhere clear. Aortic atherosclerosis. No adenopathy appreciable. Electronically Signed   By: Lowella Grip III M.D.   On: 04/23/2016 14:08   Subjective: Pt back at baseline and no further seizure activity.  Neurology cleared patient to return to group home  today.    Discharge Exam: Vitals:   04/24/16 0720 04/24/16 1144  BP: (!) 91/58 97/63  Pulse: 73 84  Resp: 12 (!) 23  Temp: 97.7 F (36.5 C) 98.5 F (36.9 C)   Vitals:   04/24/16 0720 04/24/16 0800 04/24/16 0900 04/24/16 1144  BP: (!) 91/58   97/63  Pulse: 73   84  Resp: 12   (!) 23  Temp: 97.7 F (36.5 C)   98.5 F (36.9 C)  TempSrc: Oral   Oral  SpO2: 99% 99% 99% 100%  Weight:        HEENT-  Normocephalic, no lesions, without obvious abnormality.  Normal external eye and conjunctiva.  Normal TM's bilaterally.  Normal auditory canals and external ears. Normal external nose, mucus membranes and septum.  Normal pharynx. Cardiovascular- regular rate and rhythm, S1, S2 normal, no murmur, click, rub or gallop, pulses palpable throughout   Lungs- chest clear, no wheezing, rales, normal symmetric air entry, Heart exam - S1, S2 normal, no murmur, no  gallop, rate regular Abdomen- soft, non-tender; bowel sounds normal; no masses,  no organomegaly Extremities- less then 2 second capillary refill Lymph-no adenopathy palpable Musculoskeletal-no joint tenderness, deformity or swelling Skin-warm and dry, no hyperpigmentation, vitiligo, or suspicious lesions   The results of significant diagnostics from this hospitalization (including imaging, microbiology, ancillary and laboratory) are listed below for reference.     Microbiology: Recent Results (from the past 240 hour(s))  MRSA PCR Screening     Status: None   Collection Time: 04/24/16 12:01 AM  Result Value Ref Range Status   MRSA by PCR NEGATIVE NEGATIVE Final    Comment:        The GeneXpert MRSA Assay (FDA approved for NASAL specimens only), is one component of a comprehensive MRSA colonization surveillance program. It is not intended to diagnose MRSA infection nor to guide or monitor treatment for MRSA infections.     Labs: BNP (last 3 results) No results for input(s): BNP in the last 8760 hours. Basic Metabolic Panel:  Recent Labs Lab 04/23/16 1336 04/23/16 2335 04/24/16 0245  NA 142  --  142  K 4.3  --  3.8  CL 107  --  107  CO2 21*  --  28  GLUCOSE 147*  --  103*  BUN 18  --  13  CREATININE 1.46* 1.19 1.03  CALCIUM 9.5  --  8.7*   Liver Function Tests:  Recent Labs Lab 04/23/16 1336  AST 23  ALT 15*  ALKPHOS 59  BILITOT 0.4  PROT 7.2  ALBUMIN 4.4   No results for input(s): LIPASE, AMYLASE in the last 168 hours. No results for input(s): AMMONIA in the last 168 hours. CBC:  Recent Labs Lab 04/23/16 1336 04/23/16 2335 04/24/16 0245  WBC 17.1* 14.9* 13.1*  NEUTROABS 15.0*  --   --   HGB 15.0 15.7 14.1  HCT 46.1 49.5 44.6  MCV 91.5 94.3 93.3  PLT 170 152 137*   Cardiac Enzymes: No results for input(s): CKTOTAL, CKMB, CKMBINDEX, TROPONINI in the last 168 hours. BNP: Invalid input(s): POCBNP CBG: No results for input(s): GLUCAP in the  last 168 hours. D-Dimer No results for input(s): DDIMER in the last 72 hours. Hgb A1c No results for input(s): HGBA1C in the last 72 hours. Lipid Profile No results for input(s): CHOL, HDL, LDLCALC, TRIG, CHOLHDL, LDLDIRECT in the last 72 hours. Thyroid function studies No results for input(s): TSH, T4TOTAL, T3FREE, THYROIDAB in the last 72 hours.  Invalid input(s): FREET3 Anemia work up No results for input(s): VITAMINB12, FOLATE, FERRITIN, TIBC, IRON, RETICCTPCT in the last 72 hours. Urinalysis    Component Value Date/Time   COLORURINE YELLOW 04/23/2016 1550   APPEARANCEUR CLOUDY (A) 04/23/2016 1550   LABSPEC 1.017 04/23/2016 1550   LABSPEC 1.020 03/16/2015 1520   PHURINE 5.0 04/23/2016 1550   GLUCOSEU NEGATIVE 04/23/2016 1550   GLUCOSEU Negative 03/16/2015 1520   HGBUR TRACE (A) 04/23/2016 1550   BILIRUBINUR NEGATIVE 04/23/2016 1550   BILIRUBINUR Negative 03/16/2015 1520   KETONESUR NEGATIVE 04/23/2016 1550   PROTEINUR 30 (A) 04/23/2016 1550   UROBILINOGEN 0.2 06/27/2015 1543   UROBILINOGEN 0.2 03/16/2015 1520   NITRITE NEGATIVE 04/23/2016 1550   LEUKOCYTESUR NEGATIVE 04/23/2016 1550   LEUKOCYTESUR Negative 03/16/2015 1520   Sepsis Labs Invalid input(s): PROCALCITONIN,  WBC,  LACTICIDVEN Microbiology Recent Results (from the past 240 hour(s))  MRSA PCR Screening     Status: None   Collection Time: 04/24/16 12:01 AM  Result Value Ref Range Status   MRSA by PCR NEGATIVE NEGATIVE Final    Comment:        The GeneXpert MRSA Assay (FDA approved for NASAL specimens only), is one component of a comprehensive MRSA colonization surveillance program. It is not intended to diagnose MRSA infection nor to guide or monitor treatment for MRSA infections.    Time coordinating discharge: Over 30 minutes  SIGNED:  Irwin Brakeman, MD  [facility] 04/24/2016, 1:39 PM Pager   If 7PM-7AM, please contact night-coverage [email or web address] Password [email or web address]

## 2016-04-24 NOTE — Progress Notes (Signed)
Copy of d/c instructions placed in chart.

## 2016-04-24 NOTE — Progress Notes (Signed)
Patient's caregiver/POA, [name], given/reviewed d/c documents with RN including d/c medications (which had no changes), prescription for Hycodan & Ativan and education regarding seizures. Patient alert, pleasant, oriented x self and place, moving all extremities well and able to follow simple commands. [place] aware of plans for home health PT. Patient d/c back to group home with clothing with caregiver via wheelchair and staff escort. PIV removed prior to d/c.

## 2016-04-24 NOTE — Care Management Note (Addendum)
Case Management Note  Patient Details  Name: Marco [name] MRN: [id number] Date of Birth: [birth year]  Subjective/Objective:      Patient is from [facility], and he is currently under hospice care there with [facility].  Patient is for dc back to group home today with [facility] ,they will see patient tomorrow.   Group home is providing transportation back. There.  Per pt eval rec hhpt but patient is under hospice at this time.             Action/Plan:   Expected Discharge Date:                  Expected Discharge Plan:  Group Home  In-House Referral:  Clinical Social Work  Discharge planning Services  CM Consult  Post Acute Care Choice:    Choice offered to:     DME Arranged:    DME Agency:     HH Arranged:    [place] Agency:     Status of Service:  Completed, signed off  If discussed at [employer], dates discussed:    Additional Comments:  Zenon Mayo, RN 04/24/2016, 4:17 PM

## 2016-04-24 NOTE — Plan of Care (Signed)
Problem: Health Behavior/Discharge Planning: Goal: Ability to manage health-related needs will improve Outcome: Adequate for Discharge Plans for patient to d/c to group home with caregivers and plan for home health PT

## 2016-04-27 ENCOUNTER — Telehealth: Payer: Self-pay | Admitting: *Deleted

## 2016-04-27 NOTE — Telephone Encounter (Signed)
Message to scheduling - pt to see MD to discuss additional treatment.

## 2016-04-28 LAB — CULTURE, BLOOD (ROUTINE X 2)
CULTURE: NO GROWTH
Culture: NO GROWTH

## 2016-04-30 ENCOUNTER — Telehealth: Payer: Self-pay | Admitting: Internal Medicine

## 2016-04-30 NOTE — Telephone Encounter (Signed)
S/w [name], gave appt for 9/12 @ 11.30am.

## 2016-05-10 ENCOUNTER — Ambulatory Visit: Payer: Medicare Other | Admitting: Neurology

## 2016-05-11 ENCOUNTER — Encounter: Payer: Self-pay | Admitting: Neurology

## 2016-05-16 ENCOUNTER — Encounter: Payer: Self-pay | Admitting: Internal Medicine

## 2016-05-16 ENCOUNTER — Ambulatory Visit (HOSPITAL_BASED_OUTPATIENT_CLINIC_OR_DEPARTMENT_OTHER): Payer: Medicare Other | Admitting: Internal Medicine

## 2016-05-16 ENCOUNTER — Telehealth: Payer: Self-pay | Admitting: Internal Medicine

## 2016-05-16 ENCOUNTER — Other Ambulatory Visit (HOSPITAL_BASED_OUTPATIENT_CLINIC_OR_DEPARTMENT_OTHER): Payer: Medicare Other

## 2016-05-16 ENCOUNTER — Other Ambulatory Visit: Payer: Self-pay | Admitting: *Deleted

## 2016-05-16 VITALS — BP 114/71 | HR 75 | Temp 98.2°F | Resp 18 | Ht 72.0 in | Wt 130.2 lb

## 2016-05-16 DIAGNOSIS — G40909 Epilepsy, unspecified, not intractable, without status epilepticus: Secondary | ICD-10-CM

## 2016-05-16 DIAGNOSIS — C3490 Malignant neoplasm of unspecified part of unspecified bronchus or lung: Secondary | ICD-10-CM

## 2016-05-16 DIAGNOSIS — C3492 Malignant neoplasm of unspecified part of left bronchus or lung: Secondary | ICD-10-CM

## 2016-05-16 DIAGNOSIS — C3412 Malignant neoplasm of upper lobe, left bronchus or lung: Secondary | ICD-10-CM

## 2016-05-16 LAB — CBC WITH DIFFERENTIAL/PLATELET
BASO%: 1 % (ref 0.0–2.0)
Basophils Absolute: 0.1 10*3/uL (ref 0.0–0.1)
EOS%: 0.6 % (ref 0.0–7.0)
Eosinophils Absolute: 0 10*3/uL (ref 0.0–0.5)
HCT: 46.9 % (ref 38.4–49.9)
HGB: 15.4 g/dL (ref 13.0–17.1)
LYMPH%: 22.6 % (ref 14.0–49.0)
MCH: 29.6 pg (ref 27.2–33.4)
MCHC: 32.8 g/dL (ref 32.0–36.0)
MCV: 90.2 fL (ref 79.3–98.0)
MONO#: 0.7 10*3/uL (ref 0.1–0.9)
MONO%: 9.8 % (ref 0.0–14.0)
NEUT#: 4.6 10*3/uL (ref 1.5–6.5)
NEUT%: 66 % (ref 39.0–75.0)
PLATELETS: 192 10*3/uL (ref 140–400)
RBC: 5.2 10*6/uL (ref 4.20–5.82)
RDW: 14.3 % (ref 11.0–14.6)
WBC: 7 10*3/uL (ref 4.0–10.3)
lymph#: 1.6 10*3/uL (ref 0.9–3.3)

## 2016-05-16 LAB — COMPREHENSIVE METABOLIC PANEL
ANION GAP: 8 meq/L (ref 3–11)
AST: 10 U/L (ref 5–34)
Albumin: 3.9 g/dL (ref 3.5–5.0)
Alkaline Phosphatase: 68 U/L (ref 40–150)
BUN: 13.4 mg/dL (ref 7.0–26.0)
CHLORIDE: 105 meq/L (ref 98–109)
CO2: 29 meq/L (ref 22–29)
CREATININE: 1 mg/dL (ref 0.7–1.3)
Calcium: 9.3 mg/dL (ref 8.4–10.4)
EGFR: 87 mL/min/{1.73_m2} — AB (ref >90)
Glucose: 92 mg/dl (ref 70–140)
Potassium: 4 mEq/L (ref 3.5–5.1)
Sodium: 142 mEq/L (ref 136–145)
Total Bilirubin: 0.38 mg/dL (ref 0.20–1.20)
Total Protein: 7.9 g/dL (ref 6.4–8.3)

## 2016-05-16 NOTE — Telephone Encounter (Signed)
Gave patient facility aid avs report and appointments for October. Central radiology will call re scan - aid aware.

## 2016-05-16 NOTE — Progress Notes (Signed)
[facility] [phone]   [phone]  OFFICE PROGRESS NOTE  [name],[name], MD [address] [place] [address]  DIAGNOSIS: Stage IIA (T2a., N1, M0) non-small cell lung cancer, squamous cell carcinoma presented with central left upper lobe mass as well as hilar lymphadenopathy diagnosed in January of 2015.  PRIOR THERAPY:  1. Concurrent chemoradiation with weekly carboplatin for AUC of 2 and paclitaxel 45 mg/M2, status post 7 cycles. Last cycle was given 12/22/2013  2. Systemic chemotherapy with carboplatin for AUC of 5 and paclitaxel 175 MG/M2 with Neulasta support every 3 weeks. Status post 3 cycles. 3.  Immunotherapy with Nivolumab 3 mg/kg given every 2 weeks. Status post 9 cycles. Discontinued secondary to noncompliance but the patient has a stable disease.  CURRENT THERAPY: Observation.  INTERVAL HISTORY: Marco [name] 65 y.o. male returns to the clinic today for followup visit accompanied by his caregivers. Has been off treatment for the last few months secondary to decline in his general condition and seizure episodes. He was referred to palliative care but the patient is started feeling much better and requested to be evaluated again for consideration of future treatment if needed. He denied having any significant chest pain but continues to have shortness of breath with exertion and mild cough with no hemoptysis. He lost few pounds since his last visit. He has no nausea, vomiting, diarrhea or constipation.  MEDICAL HISTORY: Past Medical History:  Diagnosis Date  . DEMENTIA   . Depression   . Developmental disability    developmentaly delayed  . Encounter for antineoplastic immunotherapy 04/05/2015  . GERD (gastroesophageal reflux disease)   . Hx of radiation therapy 11/07/13-12/24/13   lung,60Gy/66f  . Hypertension    no medications, no documented history per caregiver at preadmission  . [place] [facility]   .  Mental disorder    sczizophrenia;moderate retardation  . Mental retardation   . [place] ([place]   . Seizures (HCC)     ALLERGIES:  has No Known Allergies.  MEDICATIONS:  Current Outpatient Prescriptions  Medication Sig Dispense Refill  . acetaminophen (TYLENOL) 500 MG tablet Take 500 mg by mouth every 4 (four) hours as needed for mild pain or headache.     . bisacodyl (DULCOLAX) 5 MG EC tablet Take 5 mg by mouth daily as needed for mild constipation.     . diazepam (DIASTAT ACUDIAL) 10 MG GEL Place 5 mg rectally once as needed for seizure.    . divalproex (DEPAKOTE ER) 500 MG 24 hr tablet Take 1 tablet (500 mg total) by mouth 2 (two) times daily. 60 tablet 11  . donepezil (ARICEPT) 10 MG tablet Take 10 mg by mouth at bedtime.     . fluticasone (FLONASE) 50 MCG/ACT nasal spray Place 2 sprays into both nostrils daily.    .Marland KitchenHYDROcodone-homatropine (HYCODAN) 5-1.5 MG/5ML syrup Take 5 mLs by mouth every 6 (six) hours as needed for cough. 120 mL 0  . ketoconazole (NIZORAL) 2 % cream Apply 1 application topically 2 (two) times daily. Pt applies to buttocks.    . lacosamide (VIMPAT) 200 MG TABS tablet Take 200 mg by mouth 2 (two) times daily.    .Marland KitchenlevETIRAcetam (KEPPRA XR) 500 MG 24 hr tablet Take 3 tablets (1,500 mg total) by mouth 2 (two) times daily. 180 tablet 11  . loperamide (IMODIUM) 2 MG capsule Take 2 mg by mouth every 4 (four) hours as needed for diarrhea or  loose stools.    Marland Kitchen LORazepam (ATIVAN) 1 MG tablet Take 2 tablets (2 mg total) by mouth at bedtime. 30 tablet 0  . omeprazole (PRILOSEC) 20 MG capsule Take 20 mg by mouth 2 (two) times daily.    . polyethylene glycol (MIRALAX / GLYCOLAX) packet Take 17 g by mouth daily as needed for mild constipation or moderate constipation.    . prednisoLONE acetate (PRED FORTE) 1 % ophthalmic suspension Place 1 drop into the right eye 4 (four) times daily.    . prochlorperazine (COMPAZINE) 10 MG tablet Take 10 mg by mouth every 6 (six) hours  as needed for nausea or vomiting.    . risperiDONE (RISPERDAL) 1 MG tablet Take 1 tablet (1 mg total) by mouth at bedtime. 30 tablet 0  . traZODone (DESYREL) 50 MG tablet Take 25 mg by mouth at bedtime as needed for sleep.     No current facility-administered medications for this visit.     SURGICAL HISTORY:  Past Surgical History:  Procedure Laterality Date  . CATARACT EXTRACTION W/PHACO  07/12/2011   Procedure: CATARACT EXTRACTION PHACO AND INTRAOCULAR LENS PLACEMENT (IOC);  Surgeon: Adonis Brook;  Location: [facility];  Service: Ophthalmology;  Laterality: Left;  . EYE SURGERY     L eye  . VIDEO BRONCHOSCOPY Bilateral 10/01/2013   Procedure: VIDEO BRONCHOSCOPY WITHOUT FLUORO;  Surgeon: Collene Gobble, MD;  Location: [facility];  Service: Cardiopulmonary;  Laterality: Bilateral;    REVIEW OF SYSTEMS:  A comprehensive review of systems was negative except for: Constitutional: positive for fatigue and weight loss Respiratory: positive for cough and dyspnea on exertion   PHYSICAL EXAMINATION: General appearance: alert, cooperative and no distress Head: Normocephalic, without obvious abnormality, atraumatic Neck: no adenopathy, no JVD, supple, symmetrical, trachea midline and thyroid not enlarged, symmetric, no tenderness/mass/nodules Lymph nodes: Cervical, supraclavicular, and axillary nodes normal. Resp: clear to auscultation bilaterally Back: symmetric, no curvature. ROM normal. No CVA tenderness. Cardio: regular rate and rhythm, S1, S2 normal, no murmur, click, rub or gallop GI: soft, non-tender; bowel sounds normal; no masses,  no organomegaly Extremities: extremities normal, atraumatic, no cyanosis or edema Neurologic: Alert and oriented X 3, normal strength and tone. Normal symmetric reflexes. Normal coordination and gait  ECOG PERFORMANCE STATUS: 1 - Symptomatic but completely ambulatory  Blood pressure 114/71, pulse 75, temperature 98.2 F (36.8 C), temperature source Oral, resp.  rate 18, height 6' (1.829 m), weight 130 lb 3.2 oz (59.1 kg), SpO2 100 %.  LABORATORY DATA: Lab Results  Component Value Date   WBC 7.0 05/16/2016   HGB 15.4 05/16/2016   HCT 46.9 05/16/2016   MCV 90.2 05/16/2016   PLT 192 05/16/2016      Chemistry      Component Value Date/Time   NA 142 04/24/2016 0245   NA 141 10/18/2015 1027   K 3.8 04/24/2016 0245   K 3.8 10/18/2015 1027   CL 107 04/24/2016 0245   CO2 28 04/24/2016 0245   CO2 29 10/18/2015 1027   BUN 13 04/24/2016 0245   BUN 13.9 10/18/2015 1027   CREATININE 1.03 04/24/2016 0245   CREATININE 1.1 10/18/2015 1027      Component Value Date/Time   CALCIUM 8.7 (L) 04/24/2016 0245   CALCIUM 9.2 10/18/2015 1027   ALKPHOS 59 04/23/2016 1336   ALKPHOS 66 10/18/2015 1027   AST 23 04/23/2016 1336   AST 10 10/18/2015 1027   ALT 15 (L) 04/23/2016 1336   ALT 9 10/18/2015 1027  BILITOT 0.4 04/23/2016 1336   BILITOT 0.64 10/18/2015 1027       RADIOGRAPHIC STUDIES: Ct Head Wo Contrast  Result Date: 04/23/2016 CLINICAL DATA:  Witnessed seizure. EXAM: CT HEAD WITHOUT CONTRAST TECHNIQUE: Contiguous axial images were obtained from the base of the skull through the vertex without intravenous contrast. COMPARISON:  Jan 25, 2016 FINDINGS: Brain: No subdural, epidural, or subarachnoid hemorrhage. Ventricles and sulci are unchanged and unremarkable. Cerebellum, brainstem, and basal cisterns are normal. No mass, mass effect, or midline shift. No acute cortical ischemia or infarct. Vascular: No acute abnormality. Skull: Normal Sinuses/Orbits: Normal Other: Normal IMPRESSION: No acute abnormality. Electronically Signed   By: Dorise Bullion III M.D   On: 04/23/2016 14:47   Dg Chest Portable 1 View  Result Date: 04/23/2016 CLINICAL DATA:  Hypoxia.  Seizure.  History of lung carcinoma EXAM: PORTABLE CHEST 1 VIEW COMPARISON:  Jan 28, 2016 chest radiograph and chest CT Jan 25, 2016 FINDINGS: There is persistent left upper lobe volume loss at in  the area of known mass. Well-defined mass is not evident on this portable chest radiograph. Lungs elsewhere clear. Heart size and pulmonary vascularity are normal. No adenopathy. There is atherosclerotic calcification in the aorta. No evident bone lesions. IMPRESSION: Volume loss due to apparent mass left upper lobe. Lungs elsewhere clear. Aortic atherosclerosis. No adenopathy appreciable. Electronically Signed   By: Lowella Grip III M.D.   On: 04/23/2016 14:08   ASSESSMENT AND PLAN: This is a very pleasant 65 years old [place] American male with unresectable a stage IIA non-small cell lung cancer. He completed a course of concurrent chemoradiation with weekly carboplatin and paclitaxel status post 7 cycles. Has been observation since his treatment in April 2015.  His restaging CT scan of the chest showed further enlargement of the left upper lobe suprahilar mass concerning for disease recurrence. The patient was treated with 3 cycles of systemic chemotherapy with carboplatin and paclitaxel but unfortunately has evidence for disease progression. He was started on treatment with immunotherapy with Nivolumab status post 9 cycles and tolerating his treatment fairly well. The patient has been observation for the last few months but he started feeling much better and he would like to be evaluated for treatment if needed. I recommended for the patient to have repeat CT scan of the chest in 3 weeks and he would come back for follow-up visit after that time for discussion of his treatment options. He was advised to call immediately if he has any concerning symptoms in the interval. The patient voices understanding of current disease status and treatment options and is in agreement with the current care plan.  All questions were answered. The patient knows to call the clinic with any problems, questions or concerns. We can certainly see the patient much sooner if necessary.  Disclaimer: This note was  dictated with voice recognition software. Similar sounding words can inadvertently be transcribed and may not be corrected upon review.

## 2016-06-05 ENCOUNTER — Encounter (HOSPITAL_COMMUNITY): Payer: Self-pay

## 2016-06-05 ENCOUNTER — Ambulatory Visit (HOSPITAL_COMMUNITY)
Admission: RE | Admit: 2016-06-05 | Discharge: 2016-06-05 | Disposition: A | Discharge status: Home / Self Care | Payer: Medicare Other | Source: Ambulatory Visit | Attending: Internal Medicine | Admitting: Internal Medicine

## 2016-06-05 ENCOUNTER — Other Ambulatory Visit: Payer: Self-pay | Admitting: Medical Oncology

## 2016-06-05 ENCOUNTER — Other Ambulatory Visit (HOSPITAL_BASED_OUTPATIENT_CLINIC_OR_DEPARTMENT_OTHER): Payer: Medicare Other

## 2016-06-05 DIAGNOSIS — I700 Atherosclerosis of aorta: Secondary | ICD-10-CM | POA: Diagnosis not present

## 2016-06-05 DIAGNOSIS — I313 Pericardial effusion (noninflammatory): Secondary | ICD-10-CM | POA: Insufficient documentation

## 2016-06-05 DIAGNOSIS — G40909 Epilepsy, unspecified, not intractable, without status epilepticus: Secondary | ICD-10-CM | POA: Insufficient documentation

## 2016-06-05 DIAGNOSIS — C3492 Malignant neoplasm of unspecified part of left bronchus or lung: Secondary | ICD-10-CM | POA: Insufficient documentation

## 2016-06-05 DIAGNOSIS — C3490 Malignant neoplasm of unspecified part of unspecified bronchus or lung: Secondary | ICD-10-CM

## 2016-06-05 LAB — COMPREHENSIVE METABOLIC PANEL
AST: 10 U/L (ref 5–34)
Albumin: 3.6 g/dL (ref 3.5–5.0)
Alkaline Phosphatase: 71 U/L (ref 40–150)
Anion Gap: 11 mEq/L (ref 3–11)
BUN: 12 mg/dL (ref 7.0–26.0)
CALCIUM: 9.5 mg/dL (ref 8.4–10.4)
CHLORIDE: 106 meq/L (ref 98–109)
CO2: 29 meq/L (ref 22–29)
CREATININE: 1 mg/dL (ref 0.7–1.3)
EGFR: >90 mL/min/{1.73_m2} (ref >90)
GLUCOSE: 75 mg/dL (ref 70–140)
Potassium: 4.3 mEq/L (ref 3.5–5.1)
SODIUM: 146 meq/L — AB (ref 136–145)
Total Bilirubin: 0.43 mg/dL (ref 0.20–1.20)
Total Protein: 7.4 g/dL (ref 6.4–8.3)

## 2016-06-05 MED ORDER — IOPAMIDOL (ISOVUE-300) INJECTION 61%
75.0000 mL | Freq: Once | INTRAVENOUS | Status: AC | PRN
  Administered 2016-06-05: 75 mL via INTRAVENOUS

## 2016-06-05 MED ORDER — HYDROCODONE-HOMATROPINE 5-1.5 MG/5ML PO SYRP: 5.0000 mL | ORAL_SOLUTION | Freq: Four times a day (QID) | ORAL | 0 refills | Status: DC | PRN

## 2016-06-08 ENCOUNTER — Encounter: Payer: Self-pay | Admitting: Internal Medicine

## 2016-06-08 ENCOUNTER — Telehealth: Payer: Self-pay | Admitting: *Deleted

## 2016-06-08 ENCOUNTER — Ambulatory Visit (HOSPITAL_BASED_OUTPATIENT_CLINIC_OR_DEPARTMENT_OTHER): Payer: Medicare Other | Admitting: Internal Medicine

## 2016-06-08 ENCOUNTER — Telehealth: Payer: Self-pay | Admitting: Internal Medicine

## 2016-06-08 VITALS — BP 112/76 | HR 90 | Temp 98.1°F | Resp 18 | Ht 72.0 in | Wt 135.8 lb

## 2016-06-08 DIAGNOSIS — C3492 Malignant neoplasm of unspecified part of left bronchus or lung: Secondary | ICD-10-CM

## 2016-06-08 DIAGNOSIS — C3412 Malignant neoplasm of upper lobe, left bronchus or lung: Secondary | ICD-10-CM

## 2016-06-08 NOTE — Telephone Encounter (Signed)
Gave patient avs report and January. Central radiology will cal re scan

## 2016-06-08 NOTE — Telephone Encounter (Signed)
"  Calling to confirm an appointment for my client."  Appointment is today at 0900.  No further questions.

## 2016-06-08 NOTE — Progress Notes (Signed)
[facility] [phone]   [phone]  OFFICE PROGRESS NOTE  [name],[name], MD [address] [place] [address]  DIAGNOSIS: Stage IIA (T2a., N1, M0) non-small cell lung cancer, squamous cell carcinoma presented with central left upper lobe mass as well as hilar lymphadenopathy diagnosed in January of 2015.  PRIOR THERAPY:  1. Concurrent chemoradiation with weekly carboplatin for AUC of 2 and paclitaxel 45 mg/M2, status post 7 cycles. Last cycle was given 12/22/2013  2. Systemic chemotherapy with carboplatin for AUC of 5 and paclitaxel 175 MG/M2 with Neulasta support every 3 weeks. Status post 3 cycles. 3.  Immunotherapy with Nivolumab 3 mg/kg given every 2 weeks. Status post 9 cycles. Discontinued secondary to noncompliance but the patient has a stable disease.  CURRENT THERAPY: Observation.  INTERVAL HISTORY: Marco [name] 65 y.o. male returns to the clinic today for followup visit accompanied by his caregivers. Has been off treatment since November 2016 secondary to decline in his general condition and seizure episodes as well as noncompliance. Over the last 2 months the patient has been feeling much better and he requested reevaluation for treatment. He is feeling fine today with no specific complaints. He denied having any significant chest pain but continues to have shortness of breath with exertion and mild cough with no hemoptysis. He has no nausea, vomiting, diarrhea or constipation. He had repeat CT scan of the chest performed recently and he is here for evaluation and discussion of his scan results.  MEDICAL HISTORY: Past Medical History:  Diagnosis Date  . DEMENTIA   . Depression   . Developmental disability    developmentaly delayed  . Encounter for antineoplastic immunotherapy 04/05/2015  . GERD (gastroesophageal reflux disease)   . Hx of radiation therapy 11/07/13-12/24/13   lung,60Gy/20f  . Hypertension    no  medications, no documented history per caregiver at preadmission  . [place] [facility] [place]   . Mental disorder    sczizophrenia;moderate retardation  . Mental retardation   . [facility]   . Seizures ([facility])     ALLERGIES:  has No Known Allergies.  MEDICATIONS:  Current Outpatient Prescriptions  Medication Sig Dispense Refill  . acetaminophen (TYLENOL) 500 MG tablet Take 500 mg by mouth every 4 (four) hours as needed for mild pain or headache.     . bisacodyl (DULCOLAX) 5 MG EC tablet Take 5 mg by mouth daily as needed for mild constipation.     . diazepam (DIASTAT ACUDIAL) 10 MG GEL Place 5 mg rectally once as needed for seizure.    . divalproex (DEPAKOTE ER) 500 MG 24 hr tablet Take 1 tablet (500 mg total) by mouth 2 (two) times daily. 60 tablet 11  . donepezil (ARICEPT) 10 MG tablet Take 10 mg by mouth at bedtime.     . fluticasone (FLONASE) 50 MCG/ACT nasal spray Place 2 sprays into both nostrils daily.    .Marland KitchenHYDROcodone-homatropine (HYCODAN) 5-1.5 MG/5ML syrup Take 5 mLs by mouth every 6 (six) hours as needed for cough. 120 mL 0  . ketoconazole (NIZORAL) 2 % cream Apply 1 application topically 2 (two) times daily. Pt applies to buttocks.    . lacosamide (VIMPAT) 200 MG TABS tablet Take 200 mg by mouth 2 (two) times daily.    .Marland KitchenlevETIRAcetam (KEPPRA XR) 500 MG 24 hr tablet Take 3 tablets (1,500 mg total) by mouth 2 (two) times daily. 180 tablet 11  . loperamide (IMODIUM) 2  MG capsule Take 2 mg by mouth every 4 (four) hours as needed for diarrhea or loose stools.    Marland Kitchen LORazepam (ATIVAN) 1 MG tablet Take 2 tablets (2 mg total) by mouth at bedtime. 30 tablet 0  . omeprazole (PRILOSEC) 20 MG capsule Take 20 mg by mouth 2 (two) times daily.    . polyethylene glycol (MIRALAX / GLYCOLAX) packet Take 17 g by mouth daily as needed for mild constipation or moderate constipation.    . prednisoLONE acetate (PRED FORTE) 1 % ophthalmic suspension Place 1 drop into the right eye 4 (four) times  daily.    . prochlorperazine (COMPAZINE) 10 MG tablet Take 10 mg by mouth every 6 (six) hours as needed for nausea or vomiting.    . risperiDONE (RISPERDAL) 1 MG tablet Take 1 tablet (1 mg total) by mouth at bedtime. 30 tablet 0  . traZODone (DESYREL) 50 MG tablet Take 25 mg by mouth at bedtime as needed for sleep.     No current facility-administered medications for this visit.     SURGICAL HISTORY:  Past Surgical History:  Procedure Laterality Date  . CATARACT EXTRACTION W/PHACO  07/12/2011   Procedure: CATARACT EXTRACTION PHACO AND INTRAOCULAR LENS PLACEMENT (IOC);  Surgeon: Adonis Brook;  Location: [facility];  Service: Ophthalmology;  Laterality: Left;  . EYE SURGERY     L eye  . VIDEO BRONCHOSCOPY Bilateral 10/01/2013   Procedure: VIDEO BRONCHOSCOPY WITHOUT FLUORO;  Surgeon: Collene Gobble, MD;  Location: [facility];  Service: Cardiopulmonary;  Laterality: Bilateral;    REVIEW OF SYSTEMS:  A comprehensive review of systems was negative except for: Constitutional: positive for fatigue Respiratory: positive for cough and dyspnea on exertion   PHYSICAL EXAMINATION: General appearance: alert, cooperative and no distress Head: Normocephalic, without obvious abnormality, atraumatic Neck: no adenopathy, no JVD, supple, symmetrical, trachea midline and thyroid not enlarged, symmetric, no tenderness/mass/nodules Lymph nodes: Cervical, supraclavicular, and axillary nodes normal. Resp: clear to auscultation bilaterally Back: symmetric, no curvature. ROM normal. No CVA tenderness. Cardio: regular rate and rhythm, S1, S2 normal, no murmur, click, rub or gallop GI: soft, non-tender; bowel sounds normal; no masses,  no organomegaly Extremities: extremities normal, atraumatic, no cyanosis or edema Neurologic: Alert and oriented X 3, normal strength and tone. Normal symmetric reflexes. Normal coordination and gait  ECOG PERFORMANCE STATUS: 1 - Symptomatic but completely ambulatory  Blood pressure  112/76, pulse 90, temperature 98.1 F (36.7 C), temperature source Oral, resp. rate 18, height 6' (1.829 m), weight 135 lb 12.8 oz (61.6 kg), SpO2 100 %.  LABORATORY DATA: Lab Results  Component Value Date   WBC 7.0 05/16/2016   HGB 15.4 05/16/2016   HCT 46.9 05/16/2016   MCV 90.2 05/16/2016   PLT 192 05/16/2016      Chemistry      Component Value Date/Time   NA 146 (H) 06/05/2016 0943   K 4.3 06/05/2016 0943   CL 107 04/24/2016 0245   CO2 29 06/05/2016 0943   BUN 12.0 06/05/2016 0943   CREATININE 1.0 06/05/2016 0943      Component Value Date/Time   CALCIUM 9.5 06/05/2016 0943   ALKPHOS 71 06/05/2016 0943   AST 10 06/05/2016 0943   ALT <9 06/05/2016 0943   BILITOT 0.43 06/05/2016 0943       RADIOGRAPHIC STUDIES: Ct Chest W Contrast  Result Date: 06/05/2016 CLINICAL DATA:  Lung cancer. EXAM: CT CHEST WITH CONTRAST TECHNIQUE: Multidetector CT imaging of the chest was performed during intravenous contrast  administration. CONTRAST:  68m ISOVUE-300 IOPAMIDOL (ISOVUE-300) INJECTION 61% COMPARISON:  01/25/2016. FINDINGS: Cardiovascular: Atherosclerotic calcification of the arterial vasculature. Heart size normal. Small pericardial effusion, as before. Mediastinum/Nodes: No pathologically enlarged mediastinal or axillary lymph nodes. There is left hilar soft tissue thickening, similar. No right hilar adenopathy. Lungs/Pleura: Image quality is degraded by a respiratory motion. Masslike consolidation in the left upper lobe measures 4.6 x 7.0 cm, stable. Small nodules along the right major fissure measure 4 mm or less in size, stable. No pleural fluid. There is narrowing of the left upper lobe bronchi. Airway is otherwise unremarkable. Upper Abdomen: Visualized portions of the liver, gallbladder, adrenal glands, kidneys, spleen, pancreas, stomach and bowel are grossly unremarkable. No upper abdominal adenopathy. Musculoskeletal: No worrisome lytic or sclerotic lesions. Degenerative changes  are seen in the spine. IMPRESSION: 1. Masslike consolidation in the left upper lobe is unchanged. Associated narrowing of the left upper lobe bronchi. 2. Small pericardial effusion, stable. 3. Aortic atherosclerosis. Electronically Signed   By: MLorin PicketM.D.   On: 06/05/2016 13:30   ASSESSMENT AND PLAN: This is a very pleasant 65years old [place] male with unresectable a stage IIA non-small cell lung cancer. He completed a course of concurrent chemoradiation with weekly carboplatin and paclitaxel status post 7 cycles. Has been observation since his treatment in April 2015.  His restaging CT scan of the chest showed further enlargement of the left upper lobe suprahilar mass concerning for disease recurrence. The patient was treated with 3 cycles of systemic chemotherapy with carboplatin and paclitaxel but unfortunately has evidence for disease progression. He was started on treatment with immunotherapy with Nivolumab status post 9 cycles and tolerating his treatment fairly well. The patient has been observation since November 2016.  The recent CT scan of the chest showed no concerning findings for disease progression. I discussed the scan results with the patient and his caregivers. I recommended for him to continue on observation with repeat CT scan of the chest in 3 months. He was advised to call immediately if he has any concerning symptoms in the interval. The patient voices understanding of current disease status and treatment options and is in agreement with the current care plan.  All questions were answered. The patient knows to call the clinic with any problems, questions or concerns. We can certainly see the patient much sooner if necessary.  Disclaimer: This note was dictated with voice recognition software. Similar sounding words can inadvertently be transcribed and may not be corrected upon review.

## 2016-07-14 ENCOUNTER — Other Ambulatory Visit: Payer: Self-pay | Admitting: *Deleted

## 2016-07-14 ENCOUNTER — Telehealth: Payer: Self-pay | Admitting: *Deleted

## 2016-07-14 DIAGNOSIS — C3490 Malignant neoplasm of unspecified part of unspecified bronchus or lung: Secondary | ICD-10-CM

## 2016-07-14 MED ORDER — HYDROCODONE-HOMATROPINE 5-1.5 MG/5ML PO SYRP: 5.0000 mL | ORAL_SOLUTION | Freq: Four times a day (QID) | ORAL | 0 refills | Status: DC | PRN

## 2016-07-14 NOTE — Telephone Encounter (Signed)
Received call from [facility] requesting refill on pt's hydromet syrup.  Call back # is [phone].  Will see if Dr Irene Limbo will sign script.

## 2016-08-25 ENCOUNTER — Other Ambulatory Visit: Payer: Self-pay | Admitting: Medical Oncology

## 2016-08-25 DIAGNOSIS — C3490 Malignant neoplasm of unspecified part of unspecified bronchus or lung: Secondary | ICD-10-CM

## 2016-08-25 MED ORDER — HYDROCODONE-HOMATROPINE 5-1.5 MG/5ML PO SYRP: 5.0000 mL | ORAL_SOLUTION | Freq: Four times a day (QID) | ORAL | 0 refills | Status: DC | PRN

## 2016-09-07 ENCOUNTER — Telehealth: Payer: Self-pay

## 2016-09-07 ENCOUNTER — Other Ambulatory Visit (HOSPITAL_BASED_OUTPATIENT_CLINIC_OR_DEPARTMENT_OTHER): Payer: Medicare Other

## 2016-09-07 ENCOUNTER — Encounter (HOSPITAL_COMMUNITY): Payer: Self-pay

## 2016-09-07 ENCOUNTER — Ambulatory Visit (HOSPITAL_COMMUNITY)
Admission: RE | Admit: 2016-09-07 | Discharge: 2016-09-07 | Disposition: A | Discharge status: Home / Self Care | Payer: Medicare Other | Source: Ambulatory Visit | Attending: Internal Medicine | Admitting: Internal Medicine

## 2016-09-07 DIAGNOSIS — C3492 Malignant neoplasm of unspecified part of left bronchus or lung: Secondary | ICD-10-CM | POA: Diagnosis not present

## 2016-09-07 DIAGNOSIS — I700 Atherosclerosis of aorta: Secondary | ICD-10-CM | POA: Diagnosis not present

## 2016-09-07 LAB — COMPREHENSIVE METABOLIC PANEL
ALBUMIN: 3.7 g/dL (ref 3.5–5.0)
ALT: 7 U/L (ref 0–55)
AST: 9 U/L (ref 5–34)
Alkaline Phosphatase: 77 U/L (ref 40–150)
Anion Gap: 9 mEq/L (ref 3–11)
BILIRUBIN TOTAL: 0.39 mg/dL (ref 0.20–1.20)
BUN: 11.1 mg/dL (ref 7.0–26.0)
CO2: 29 meq/L (ref 22–29)
Calcium: 9.2 mg/dL (ref 8.4–10.4)
Chloride: 105 mEq/L (ref 98–109)
Creatinine: 1 mg/dL (ref 0.7–1.3)
EGFR: 87 mL/min/{1.73_m2} — AB (ref >90)
GLUCOSE: 92 mg/dL (ref 70–140)
Potassium: 4.1 mEq/L (ref 3.5–5.1)
SODIUM: 143 meq/L (ref 136–145)
TOTAL PROTEIN: 7.2 g/dL (ref 6.4–8.3)

## 2016-09-07 LAB — CBC WITH DIFFERENTIAL/PLATELET
BASO%: 1.1 % (ref 0.0–2.0)
Basophils Absolute: 0.1 10*3/uL (ref 0.0–0.1)
EOS%: 0.4 % (ref 0.0–7.0)
Eosinophils Absolute: 0 10*3/uL (ref 0.0–0.5)
HCT: 43.4 % (ref 38.4–49.9)
HEMOGLOBIN: 14.3 g/dL (ref 13.0–17.1)
LYMPH%: 20.8 % (ref 14.0–49.0)
MCH: 29.6 pg (ref 27.2–33.4)
MCHC: 33 g/dL (ref 32.0–36.0)
MCV: 89.5 fL (ref 79.3–98.0)
MONO#: 0.7 10*3/uL (ref 0.1–0.9)
MONO%: 10.9 % (ref 0.0–14.0)
NEUT%: 66.8 % (ref 39.0–75.0)
NEUTROS ABS: 4.2 10*3/uL (ref 1.5–6.5)
Platelets: 183 10*3/uL (ref 140–400)
RBC: 4.85 10*6/uL (ref 4.20–5.82)
RDW: 13.9 % (ref 11.0–14.6)
WBC: 6.3 10*3/uL (ref 4.0–10.3)
lymph#: 1.3 10*3/uL (ref 0.9–3.3)

## 2016-09-07 MED ORDER — IOPAMIDOL (ISOVUE-300) INJECTION 61%
75.0000 mL | Freq: Once | INTRAVENOUS | Status: DC | PRN
  Administered 2016-09-07: 75 mL via INTRAVENOUS
  Filled 2016-09-07: qty 75

## 2016-09-07 MED ORDER — IOPAMIDOL (ISOVUE-300) INJECTION 61%
INTRAVENOUS | Status: AC
  Filled 2016-09-07: qty 75

## 2016-09-07 NOTE — Telephone Encounter (Signed)
Received "call report" from [facility] radiology on pt's CT chest done today.

## 2016-09-13 ENCOUNTER — Other Ambulatory Visit: Payer: Self-pay | Admitting: *Deleted

## 2016-09-13 ENCOUNTER — Other Ambulatory Visit: Payer: Self-pay | Admitting: Neurology

## 2016-09-14 ENCOUNTER — Ambulatory Visit (HOSPITAL_BASED_OUTPATIENT_CLINIC_OR_DEPARTMENT_OTHER): Payer: Medicare Other | Admitting: Internal Medicine

## 2016-09-14 ENCOUNTER — Encounter: Payer: Self-pay | Admitting: *Deleted

## 2016-09-14 ENCOUNTER — Encounter: Payer: Self-pay | Admitting: Internal Medicine

## 2016-09-14 ENCOUNTER — Telehealth: Payer: Self-pay | Admitting: Internal Medicine

## 2016-09-14 DIAGNOSIS — I10 Essential (primary) hypertension: Secondary | ICD-10-CM | POA: Diagnosis not present

## 2016-09-14 DIAGNOSIS — E440 Moderate protein-calorie malnutrition: Secondary | ICD-10-CM | POA: Diagnosis not present

## 2016-09-14 DIAGNOSIS — R634 Abnormal weight loss: Secondary | ICD-10-CM | POA: Diagnosis not present

## 2016-09-14 DIAGNOSIS — R569 Unspecified convulsions: Secondary | ICD-10-CM

## 2016-09-14 DIAGNOSIS — C3492 Malignant neoplasm of unspecified part of left bronchus or lung: Secondary | ICD-10-CM

## 2016-09-14 DIAGNOSIS — C3412 Malignant neoplasm of upper lobe, left bronchus or lung: Secondary | ICD-10-CM

## 2016-09-14 NOTE — Progress Notes (Signed)
Faxed printed/signed rx vimpat '200mg'$  to pt pharmacy. Qty 60, 1 refill. Fax: [phone]. Received confirmation.

## 2016-09-14 NOTE — Progress Notes (Signed)
[facility] [phone]   [phone]  OFFICE PROGRESS NOTE  [name],[name], MD [address] [place] [address]  DIAGNOSIS: Stage IIA (T2a., N1, M0) non-small cell lung cancer, squamous cell carcinoma presented with central left upper lobe mass as well as hilar lymphadenopathy diagnosed in January of 2015.  PRIOR THERAPY:  1. Concurrent chemoradiation with weekly carboplatin for AUC of 2 and paclitaxel 45 mg/M2, status post 7 cycles. Last cycle was given 12/22/2013  2. Systemic chemotherapy with carboplatin for AUC of 5 and paclitaxel 175 MG/M2 with Neulasta support every 3 weeks. Status post 3 cycles. 3.  Immunotherapy with Nivolumab 3 mg/kg given every 2 weeks. Status post 9 cycles. Discontinued secondary to noncompliance but the patient has a stable disease.  CURRENT THERAPY: Observation.  INTERVAL HISTORY: TATUM MASSMAN 66 y.o. male came to the clinic today accompanied by his caregiver. The patient is feeling fine today except for generalized fatigue. He also has choking sensation with solid and liquids. He has shortness of breath with exertion and mild cough. He lost few pounds since his last visit. He has been observation for more than a year. He denied having any headache or visual changes. He denied having any nausea, vomiting, diarrhea or constipation. He has no chest pain or hemoptysis. He had repeat CT scan of the chest performed recently and he is here for evaluation and discussion of his scan results.  MEDICAL HISTORY: Past Medical History:  Diagnosis Date  . DEMENTIA   . Depression   . Developmental disability    developmentaly delayed  . Encounter for antineoplastic immunotherapy 04/05/2015  . GERD (gastroesophageal reflux disease)   . Hx of radiation therapy 11/07/13-12/24/13   lung,60Gy/34f  . Hypertension    no medications, no documented history per caregiver at preadmission  . Lung cancer ([facility]   . Mental  disorder    sczizophrenia;moderate retardation  . Mental retardation   . [facility]   . Seizures ([facility])     ALLERGIES:  has No Known Allergies.  MEDICATIONS:  Current Outpatient Prescriptions  Medication Sig Dispense Refill  . acetaminophen (TYLENOL) 500 MG tablet Take 500 mg by mouth every 4 (four) hours as needed for mild pain or headache.     . bisacodyl (DULCOLAX) 5 MG EC tablet Take 5 mg by mouth daily as needed for mild constipation.     . diazepam (DIASTAT ACUDIAL) 10 MG GEL Place 5 mg rectally once as needed for seizure.    . divalproex (DEPAKOTE ER) 500 MG 24 hr tablet Take 1 tablet (500 mg total) by mouth 2 (two) times daily. 60 tablet 11  . donepezil (ARICEPT) 10 MG tablet Take 10 mg by mouth at bedtime.     . fluticasone (FLONASE) 50 MCG/ACT nasal spray Place 2 sprays into both nostrils daily.    .Marland KitchenHYDROcodone-homatropine (HYCODAN) 5-1.5 MG/5ML syrup Take 5 mLs by mouth every 6 (six) hours as needed for cough. 120 mL 0  . ketoconazole (NIZORAL) 2 % cream Apply 1 application topically 2 (two) times daily. Pt applies to buttocks.    . lacosamide (VIMPAT) 200 MG TABS tablet Take one tab twice daily.  Please call [phone] schedule an appt for continued refills. 60 tablet 1  . levETIRAcetam (KEPPRA XR) 500 MG 24 hr tablet Take 3 tablets (1,500 mg total) by mouth 2 (two) times daily. 180 tablet 11  . loperamide (IMODIUM) 2 MG capsule Take  2 mg by mouth every 4 (four) hours as needed for diarrhea or loose stools.    Marland Kitchen LORazepam (ATIVAN) 1 MG tablet Take 2 tablets (2 mg total) by mouth at bedtime. 30 tablet 0  . omeprazole (PRILOSEC) 20 MG capsule Take 20 mg by mouth 2 (two) times daily.    . polyethylene glycol (MIRALAX / GLYCOLAX) packet Take 17 g by mouth daily as needed for mild constipation or moderate constipation.    . prednisoLONE acetate (PRED FORTE) 1 % ophthalmic suspension Place 1 drop into the right eye 4 (four) times daily.    . prochlorperazine (COMPAZINE)  10 MG tablet Take 10 mg by mouth every 6 (six) hours as needed for nausea or vomiting.    . risperiDONE (RISPERDAL) 1 MG tablet Take 1 tablet (1 mg total) by mouth at bedtime. 30 tablet 0  . traZODone (DESYREL) 50 MG tablet Take 25 mg by mouth at bedtime as needed for sleep.     No current facility-administered medications for this visit.     SURGICAL HISTORY:  Past Surgical History:  Procedure Laterality Date  . CATARACT EXTRACTION W/PHACO  07/12/2011   Procedure: CATARACT EXTRACTION PHACO AND INTRAOCULAR LENS PLACEMENT (IOC);  Surgeon: Adonis Brook;  Location: [facility];  Service: Ophthalmology;  Laterality: Left;  . EYE SURGERY     L eye  . VIDEO BRONCHOSCOPY Bilateral 10/01/2013   Procedure: VIDEO BRONCHOSCOPY WITHOUT FLUORO;  Surgeon: Collene Gobble, MD;  Location: [facility];  Service: Cardiopulmonary;  Laterality: Bilateral;    REVIEW OF SYSTEMS:  Constitutional: positive for fatigue and weight loss Eyes: negative Ears, nose, mouth, throat, and face: negative Respiratory: positive for cough and dyspnea on exertion Cardiovascular: negative Gastrointestinal: negative Genitourinary:negative Integument/breast: negative Hematologic/lymphatic: negative Musculoskeletal:negative Neurological: negative Behavioral/Psych: negative Endocrine: negative Allergic/Immunologic: negative   PHYSICAL EXAMINATION: General appearance: alert, cooperative, fatigued and no distress Head: Normocephalic, without obvious abnormality, atraumatic Neck: no adenopathy, no JVD, supple, symmetrical, trachea midline and thyroid not enlarged, symmetric, no tenderness/mass/nodules Lymph nodes: Cervical, supraclavicular, and axillary nodes normal. Resp: clear to auscultation bilaterally Back: symmetric, no curvature. ROM normal. No CVA tenderness. Cardio: regular rate and rhythm, S1, S2 normal, no murmur, click, rub or gallop GI: soft, non-tender; bowel sounds normal; no masses,  no organomegaly Extremities:  extremities normal, atraumatic, no cyanosis or edema Neurologic: Alert and oriented X 3, normal strength and tone. Normal symmetric reflexes. Normal coordination and gait  ECOG PERFORMANCE STATUS: 1 - Symptomatic but completely ambulatory  There were no vitals taken for this visit.  LABORATORY DATA: Lab Results  Component Value Date   WBC 6.3 09/07/2016   HGB 14.3 09/07/2016   HCT 43.4 09/07/2016   MCV 89.5 09/07/2016   PLT 183 09/07/2016      Chemistry      Component Value Date/Time   NA 143 09/07/2016 0922   K 4.1 09/07/2016 0922   CL 107 04/24/2016 0245   CO2 29 09/07/2016 0922   BUN 11.1 09/07/2016 0922   CREATININE 1.0 09/07/2016 0922      Component Value Date/Time   CALCIUM 9.2 09/07/2016 0922   ALKPHOS 77 09/07/2016 0922   AST 9 09/07/2016 0922   ALT 7 09/07/2016 0922   BILITOT 0.39 09/07/2016 5498       RADIOGRAPHIC STUDIES: Ct Chest W Contrast  Result Date: 09/07/2016 CLINICAL DATA:  66 year old male with history of left-sided lung cancer diagnosed in 2015 status post radiation therapy which is now complete. Shortness of breath on  exertion. EXAM: CT CHEST WITH CONTRAST TECHNIQUE: Multidetector CT imaging of the chest was performed during intravenous contrast administration. CONTRAST:  39m ISOVUE-300 IOPAMIDOL (ISOVUE-300) INJECTION 61% COMPARISON:  Multiple priors, most recently 06/05/2016. FINDINGS: Cardiovascular: Heart size is normal. Small amount of pericardial fluid and/or thickening, similar to the prior study, unlikely to be of hemodynamic significance at this time. No associated pericardial calcification. Atherosclerosis of the thoracic aorta. No coronary artery calcifications. Mediastinum/Nodes: Soft tissue fullness in the left suprahilar region, some of which is presumably adenopathy. No additional mediastinal or right hilar lymphadenopathy is noted. Lungs/Pleura: As with prior studies, there is essentially complete collapse of the left upper lobe, with a  chronic mass-like area in the medial aspect of the left upper lobe which now appears larger than the prior examination, measuring up to 7.7 x 4.6 cm on axial image 54 of series 2) previously 7.0 x 4.6 cm on 06/05/2016. When compared to more remote prior examinations the differences far more striking, as the lesion measured approximately 6.1 x 3.5 cm on remote prior study 09/17/2015. The increase in size is also apparent when viewed on sagittal and coronal reconstructions, as lesion measures up to 7.1 cm craniocaudally on coronal image 86 of series 5) as compared with 5.5 cm on 09/17/2015), and the lesion measures up to 4.1 cm in height on sagittal image 101 of series 6) as compared with 2.7 cm on prior study 09/17/2015). These findings are highly concerning for local recurrence of disease. The lesion makes a broad contact with the mediastinum over approximately 30% of its circumference, obscuring the fat plane between the lesion and the adjacent distal aortic arch. There is a small loculated pleural effusion in the posterior aspect of the left apex adjacent to the lesion. Compensatory hyperexpansion of the left lower lobe. 4 mm subpleural nodule in the anterior aspect of the superior segment of the right lower lobe abutting the major fissure (image 60 of series 7) is unchanged, as is a 4 mm nodule associated with the right major fissure on image 78 of series 7, both of which are favored to represent subpleural lymph nodes. No other suspicious appearing pulmonary nodules or masses. No right-sided pleural effusion. Upper Abdomen: Unremarkable. Musculoskeletal: There are no aggressive appearing lytic or blastic lesions noted in the visualized portions of the skeleton. IMPRESSION: 1. Findings are highly concerning for local recurrence of disease, as detailed above. Further evaluation with PET-CT is suggested at this time. 2. Aortic atherosclerosis. 3. Small amount of pericardial fluid and/or thickening, similar to the  prior examination and unlikely to be of any hemodynamic significance at this time. These results will be called to the ordering clinician or representative by the Radiologist Assistant, and communication documented in the PACS or zVision Dashboard. Electronically Signed   By: DVinnie LangtonM.D.   On: 09/07/2016 13:20   ASSESSMENT AND PLAN:  This is a very pleasant 66years old African-American male with unresectable a stage II a non-small cell lung cancer, squamous cell carcinoma status post a course of concurrent chemoradiation with weekly carboplatin and paclitaxel. He has partial response initially followed by disease progression and the patient was treated with 3 cycles of systemic chemotherapy with carboplatin and paclitaxel discontinued secondary to disease progression. He was also started on treatment with immunotherapy with Nivolumab status post 9 cycles and tolerated the treatment well but he has a lot of noncompliance and he used to leave the chemotherapy room during the treatment. Has been observation for more than  a year. His recent CT scan of the chest showed some evidence for disease progression with local recurrence in the left lower lobe. I personally and independently reviewed the scan images and discuss the results with the patient and his caregiver. I recommended for him to have repeat PET scan for further evaluation of his disease. I will see him back for follow-up visit in 2 weeks for evaluation and discussion of the PET scan results and further recommendation regarding treatment of his condition. For the weight loss, I strongly encouraged the patient to increase his oral intake and used nutrition supplements if needed. For the hypertension, he will continue with his current blood pressure medication. The patient was advised to call immediately if he has any concerning symptoms in the interval. The patient voices understanding of current disease status and treatment options and  is in agreement with the current care plan.  All questions were answered. The patient knows to call the clinic with any problems, questions or concerns. We can certainly see the patient much sooner if necessary.  Disclaimer: This note was dictated with voice recognition software. Similar sounding words can inadvertently be transcribed and may not be corrected upon review.

## 2016-09-14 NOTE — Telephone Encounter (Signed)
Pt reviewed appt, given contact info for [facility], and has given avs.

## 2016-09-15 ENCOUNTER — Telehealth: Payer: Self-pay | Admitting: Medical Oncology

## 2016-09-15 NOTE — Telephone Encounter (Signed)
Received pt consent for [facility] and records sent. Done.

## 2016-09-22 ENCOUNTER — Other Ambulatory Visit: Payer: Self-pay | Admitting: Nurse Practitioner

## 2016-09-29 ENCOUNTER — Ambulatory Visit (HOSPITAL_COMMUNITY)
Admission: RE | Admit: 2016-09-29 | Discharge: 2016-09-29 | Disposition: A | Discharge status: Home / Self Care | Payer: Medicare Other | Source: Ambulatory Visit | Attending: Internal Medicine | Admitting: Internal Medicine

## 2016-09-29 DIAGNOSIS — E440 Moderate protein-calorie malnutrition: Secondary | ICD-10-CM | POA: Insufficient documentation

## 2016-09-29 DIAGNOSIS — R569 Unspecified convulsions: Secondary | ICD-10-CM | POA: Diagnosis not present

## 2016-09-29 DIAGNOSIS — C3492 Malignant neoplasm of unspecified part of left bronchus or lung: Secondary | ICD-10-CM | POA: Diagnosis present

## 2016-09-29 DIAGNOSIS — I10 Essential (primary) hypertension: Secondary | ICD-10-CM | POA: Diagnosis not present

## 2016-09-29 DIAGNOSIS — R918 Other nonspecific abnormal finding of lung field: Secondary | ICD-10-CM | POA: Diagnosis not present

## 2016-09-29 LAB — GLUCOSE, CAPILLARY: GLUCOSE-CAPILLARY: 82 mg/dL (ref 65–99)

## 2016-09-29 MED ORDER — FLUDEOXYGLUCOSE F - 18 (FDG) INJECTION
6.7700 | Freq: Once | INTRAVENOUS | Status: AC | PRN
  Administered 2016-09-29: 6.77 via INTRAVENOUS

## 2016-10-04 ENCOUNTER — Telehealth: Payer: Self-pay | Admitting: Medical Oncology

## 2016-10-04 NOTE — Telephone Encounter (Signed)
Needs confirmation if pt coming off hospice because he is starting treatment. I told her Hospice will not pay for PET scan.

## 2016-10-10 ENCOUNTER — Other Ambulatory Visit: Payer: Self-pay | Admitting: Neurology

## 2016-10-10 ENCOUNTER — Ambulatory Visit (HOSPITAL_BASED_OUTPATIENT_CLINIC_OR_DEPARTMENT_OTHER): Payer: Medicare Other | Admitting: Internal Medicine

## 2016-10-10 ENCOUNTER — Encounter: Payer: Self-pay | Admitting: Internal Medicine

## 2016-10-10 VITALS — BP 121/92 | HR 98 | Temp 97.8°F | Resp 18 | Ht 72.0 in | Wt 130.8 lb

## 2016-10-10 DIAGNOSIS — R569 Unspecified convulsions: Secondary | ICD-10-CM | POA: Diagnosis not present

## 2016-10-10 DIAGNOSIS — C3492 Malignant neoplasm of unspecified part of left bronchus or lung: Secondary | ICD-10-CM

## 2016-10-10 DIAGNOSIS — E43 Unspecified severe protein-calorie malnutrition: Secondary | ICD-10-CM | POA: Diagnosis not present

## 2016-10-10 DIAGNOSIS — Z5111 Encounter for antineoplastic chemotherapy: Secondary | ICD-10-CM | POA: Insufficient documentation

## 2016-10-10 DIAGNOSIS — R634 Abnormal weight loss: Secondary | ICD-10-CM | POA: Diagnosis not present

## 2016-10-10 DIAGNOSIS — C3412 Malignant neoplasm of upper lobe, left bronchus or lung: Secondary | ICD-10-CM | POA: Diagnosis present

## 2016-10-10 HISTORY — DX: Encounter for antineoplastic chemotherapy: Z51.11

## 2016-10-10 NOTE — Progress Notes (Signed)
[place] Telephone:(336) [phone]   Fax:(336) [phone]  OFFICE PROGRESS NOTE  [name],[name], MD [address] [place] [address]  DIAGNOSIS: Stage IIA (T2a., N1, M0) non-small cell lung cancer, squamous cell carcinoma presented with central left upper lobe mass as well as hilar lymphadenopathy diagnosed in January of 2015.  PRIOR THERAPY:  1. Concurrent chemoradiation with weekly carboplatin for AUC of 2 and paclitaxel 45 mg/M2, status post 7 cycles. Last cycle was given 12/22/2013  2. Systemic chemotherapy with carboplatin for AUC of 5 and paclitaxel 175 MG/M2 with Neulasta support every 3 weeks. Status post 3 cycles. 3.  Immunotherapy with Nivolumab 3 mg/kg given every 2 weeks. Status post 9 cycles. Discontinued secondary to noncompliance but the patient has a stable disease.  CURRENT THERAPY: Observation.  INTERVAL HISTORY: Marco [name] 66 y.o. male came to the clinic today for follow-up visit accompanied by his caregiver and 2 of his hospice nurses. The patient is feeling fine today with no specific complaints. He denied having any chest pain but has shortness of breath with exertion with mild cough and no hemoptysis. He has around 6 pounds of weight loss recently. He denied having any fever or chills. He has no nausea or vomiting. He was found on previous CT scan of the chest to have concerning findings for disease progression. I ordered a PET scan which was performed recently for further evaluation of his condition. He is here today for discussion of the PET scan results and recommendation regarding his treatment.  MEDICAL HISTORY: Past Medical History:  Diagnosis Date  . DEMENTIA   . Depression   . Developmental disability    developmentaly delayed  . Encounter for antineoplastic immunotherapy 04/05/2015  . GERD (gastroesophageal reflux disease)   . Hx of radiation therapy 11/07/13-12/24/13   lung,60Gy/65f  . Hypertension    no  medications, no documented history per caregiver at preadmission  . Lung cancer ([facility]   . Mental disorder    sczizophrenia;moderate retardation  . Mental retardation   . Seizure disorder ([facility]   . Seizures ([facility])     ALLERGIES:  has No Known Allergies.  MEDICATIONS:  Current Outpatient Prescriptions  Medication Sig Dispense Refill  . acetaminophen (TYLENOL) 500 MG tablet Take 500 mg by mouth every 4 (four) hours as needed for mild pain or headache.     . bisacodyl (DULCOLAX) 5 MG EC tablet Take 5 mg by mouth daily as needed for mild constipation.     . diazepam (DIASTAT ACUDIAL) 10 MG GEL Place 5 mg rectally once as needed for seizure.    . divalproex (DEPAKOTE ER) 500 MG 24 hr tablet Take 1 tablet (500 mg total) by mouth 2 (two) times daily. 60 tablet 11  . donepezil (ARICEPT) 10 MG tablet Take 10 mg by mouth at bedtime.     . fluticasone (FLONASE) 50 MCG/ACT nasal spray Place 2 sprays into both nostrils daily.    .Marland KitchenHYDROcodone-homatropine (HYCODAN) 5-1.5 MG/5ML syrup Take 5 mLs by mouth every 6 (six) hours as needed for cough. 120 mL 0  . ketoconazole (NIZORAL) 2 % cream Apply 1 application topically 2 (two) times daily. Pt applies to buttocks.    . lacosamide (VIMPAT) 200 MG TABS tablet Take one tab twice daily.  Please call [phone] schedule an appt for continued refills. 60 tablet 1  . levETIRAcetam (KEPPRA XR) 500 MG 24 hr tablet Take 3 tablets (1,500 mg total) by  mouth 2 (two) times daily. 180 tablet 11  . loperamide (IMODIUM) 2 MG capsule Take 2 mg by mouth every 4 (four) hours as needed for diarrhea or loose stools.    Marland Kitchen LORazepam (ATIVAN) 1 MG tablet Take 2 tablets (2 mg total) by mouth at bedtime. 30 tablet 0  . omeprazole (PRILOSEC) 20 MG capsule Take 20 mg by mouth 2 (two) times daily.    . polyethylene glycol (MIRALAX / GLYCOLAX) packet Take 17 g by mouth daily as needed for mild constipation or moderate constipation.    . prednisoLONE acetate (PRED FORTE) 1 % ophthalmic  suspension Place 1 drop into the right eye 4 (four) times daily.    . prochlorperazine (COMPAZINE) 10 MG tablet Take 10 mg by mouth every 6 (six) hours as needed for nausea or vomiting.    . risperiDONE (RISPERDAL) 1 MG tablet Take 1 tablet (1 mg total) by mouth at bedtime. 30 tablet 0  . traZODone (DESYREL) 50 MG tablet Take 25 mg by mouth at bedtime as needed for sleep.     No current facility-administered medications for this visit.     SURGICAL HISTORY:  Past Surgical History:  Procedure Laterality Date  . CATARACT EXTRACTION W/PHACO  07/12/2011   Procedure: CATARACT EXTRACTION PHACO AND INTRAOCULAR LENS PLACEMENT (IOC);  Surgeon: Adonis Brook;  Location: [facility];  Service: Ophthalmology;  Laterality: Left;  . EYE SURGERY     L eye  . VIDEO BRONCHOSCOPY Bilateral 10/01/2013   Procedure: VIDEO BRONCHOSCOPY WITHOUT FLUORO;  Surgeon: Collene Gobble, MD;  Location: [facility];  Service: Cardiopulmonary;  Laterality: Bilateral;    REVIEW OF SYSTEMS:  Constitutional: positive for fatigue and weight loss Eyes: negative Ears, nose, mouth, throat, and face: negative Respiratory: positive for cough and dyspnea on exertion Cardiovascular: negative Gastrointestinal: negative Genitourinary:negative Integument/breast: negative Hematologic/lymphatic: negative Musculoskeletal:negative Neurological: negative Behavioral/Psych: negative Endocrine: negative Allergic/Immunologic: negative   PHYSICAL EXAMINATION: General appearance: alert, cooperative and no distress Head: Normocephalic, without obvious abnormality, atraumatic Neck: no adenopathy, no JVD, supple, symmetrical, trachea midline and thyroid not enlarged, symmetric, no tenderness/mass/nodules Lymph nodes: Cervical, supraclavicular, and axillary nodes normal. Resp: clear to auscultation bilaterally Back: symmetric, no curvature. ROM normal. No CVA tenderness. Cardio: regular rate and rhythm, S1, S2 normal, no murmur, click, rub or  gallop GI: soft, non-tender; bowel sounds normal; no masses,  no organomegaly Extremities: extremities normal, atraumatic, no cyanosis or edema Neurologic: Alert and oriented X 3, normal strength and tone. Normal symmetric reflexes. Normal coordination and gait  ECOG PERFORMANCE STATUS: 1 - Symptomatic but completely ambulatory  Blood pressure (!) 121/92, pulse 98, temperature 97.8 F (36.6 C), temperature source Oral, resp. rate 18, height 6' (1.829 m), weight 130 lb 12.8 oz (59.3 kg), SpO2 100 %.  LABORATORY DATA: Lab Results  Component Value Date   WBC 6.3 09/07/2016   HGB 14.3 09/07/2016   HCT 43.4 09/07/2016   MCV 89.5 09/07/2016   PLT 183 09/07/2016      Chemistry      Component Value Date/Time   NA 143 09/07/2016 0922   K 4.1 09/07/2016 0922   CL 107 04/24/2016 0245   CO2 29 09/07/2016 0922   BUN 11.1 09/07/2016 0922   CREATININE 1.0 09/07/2016 0922      Component Value Date/Time   CALCIUM 9.2 09/07/2016 0922   ALKPHOS 77 09/07/2016 0922   AST 9 09/07/2016 0922   ALT 7 09/07/2016 0922   BILITOT 0.39 09/07/2016 7510  RADIOGRAPHIC STUDIES: Nm Pet Image Restag (ps) Skull Base To Thigh  Result Date: 09/29/2016 CLINICAL DATA:  Subsequent treatment strategy for lung cancer. Suspected left suprahilar recurrence on recent chest CT. EXAM: NUCLEAR MEDICINE PET SKULL BASE TO THIGH TECHNIQUE: 6.77 mCi F-18 FDG was injected intravenously. Full-ring PET imaging was performed from the skull base to thigh after the radiotracer. CT data was obtained and used for attenuation correction and anatomic localization. FASTING BLOOD GLUCOSE:  Value: 82 mg/dl COMPARISON:  PET-CT 09/22/2013.  Chest CT 09/07/2016. FINDINGS: NECK No hypermetabolic cervical lymph nodes are identified.There are no lesions of the pharyngeal mucosal space. CHEST The recurrent left suprahilar mass is hypermetabolic. This lesion measures approximately 7.1 x 3.2 cm transverse on image 68, demonstrating broad  contact with the mediastinum. There is probably some central necrosis associated with lesion. The greatest hypermetabolic activity is located posteromedially within SUV max of 12.6. There are no discrete hypermetabolic mediastinal, hilar or axillary lymph nodes. There is new hypermetabolic activity within the right lower lobe, corresponding with new ground-glass opacities, likely inflammatory. These demonstrate an SUV max of 4.8. No other suspicious pulmonary activity. Probable perifissural nodes along the right major fissure are stable. Pericardial effusion appears stable without abnormal metabolic activity. ABDOMEN/PELVIS There is no hypermetabolic activity within the liver, adrenal glands, spleen or pancreas. There is no hypermetabolic nodal activity. There is a small cyst in the interpolar region of the right kidney. There is mild aortic and branch vessel atherosclerosis. Some urine contamination is noted in the perineal region. SKELETON There is no hypermetabolic activity to suggest osseous metastatic disease. IMPRESSION: 1. Local recurrence of disease in the left perihilar region with associated peripheral hypermetabolic activity, greatest posteriorly. 2. No distant metastases identified. 3. Patchy ground-glass opacities in the right lower lobe are mildly hypermetabolic, but new from CT of 3 weeks ago and likely inflammatory. Electronically Signed   By: Richardean Sale M.D.   On: 09/29/2016 14:23   ASSESSMENT AND PLAN:  This is a pleasant 66 years old African-American male with history of unresectable a stage II a non-small cell lung cancer, squamous cell carcinoma. He was treated with a course of concurrent chemoradiation with weekly carboplatin and paclitaxel with the initial partial response followed by progression. He was also treated with 3 cycles of systemic chemotherapy with carboplatin and paclitaxel but this was discontinued secondary to disease progression. The patient was also treated with  immunotherapy with Nivolumab status post 9 cycles but the treatment was discontinued secondary to noncompliance. Has been on observation and hospice and palliative care for more than a year. His most recent CT scan of the chest showed concerning findings for disease progression with local recurrence in the left lower lobe. He had a PET scan performed recently. I personally and independently reviewed the PET scan images and discuss the results and showed the images to the patient and his caregiver. The PET scan confirms the evidence for local disease recurrence. I discussed with them consideration of resuming treatment with immunotherapy with Nivolumab versus continuous observation and palliative and hospice care. The patient will discuss the results with his guardian and let us know of their decision regarding treatment or continuation with hospice care. For the weight loss, I encouraged the patient to continue increasing his oral intake. We will refer him to the dietitian at the [facility] if needed. For the psychiatric issue and dementia, he will continue his current treatment with Aricept and risperidone For the history of seizure, the patient will continue  on Keppra and Vimpat. We will arrange a follow-up visit based on their final decision regarding treatment. He was advised to call if he has any concerning symptoms in the interval.  The patient voices understanding of current disease status and treatment options and is in agreement with the current care plan.  All questions were answered. The patient knows to call the clinic with any problems, questions or concerns. We can certainly see the patient much sooner if necessary.  Disclaimer: This note was dictated with voice recognition software. Similar sounding words can inadvertently be transcribed and may not be corrected upon review.

## 2016-10-20 ENCOUNTER — Telehealth: Payer: Self-pay | Admitting: *Deleted

## 2016-10-20 DIAGNOSIS — C3490 Malignant neoplasm of unspecified part of unspecified bronchus or lung: Secondary | ICD-10-CM

## 2016-10-20 MED ORDER — HYDROCODONE-HOMATROPINE 5-1.5 MG/5ML PO SYRP: 5.0000 mL | ORAL_SOLUTION | Freq: Four times a day (QID) | ORAL | 0 refills | Status: DC | PRN

## 2016-10-20 NOTE — Telephone Encounter (Signed)
[name] ( pt care taker) called with request for Hycodan Refill. Discussed with [name] MD out of office and will return on Monday 2/19. Rx can be picked up at that time. [name] verbalized understanding no further concerns.

## 2016-11-03 ENCOUNTER — Telehealth: Payer: Self-pay | Admitting: *Deleted

## 2016-11-03 NOTE — Telephone Encounter (Signed)
Per instructions, patient family called MD [facility] office to inform him that they would like to proceed with treatment. Follow up office visit not in place. When you like to see this patient.

## 2016-11-04 ENCOUNTER — Telehealth: Payer: Self-pay | Admitting: Neurology

## 2016-11-06 ENCOUNTER — Telehealth: Payer: Self-pay | Admitting: Neurology

## 2016-11-06 ENCOUNTER — Telehealth: Payer: Self-pay | Admitting: Internal Medicine

## 2016-11-06 NOTE — Telephone Encounter (Signed)
Rx approved, printed and awaiting MD signature. Returned call to pharmacist and she will watch for faxed rx F # [phone].

## 2016-11-06 NOTE — Telephone Encounter (Signed)
Rx signed and faxed to pharmacy.

## 2016-11-06 NOTE — Telephone Encounter (Signed)
April with [facility] is calling to get a verbal order for lacosamide (VIMPAT) 200 MG TABS tablet for the patient. She says has not received an order.

## 2016-11-06 NOTE — Telephone Encounter (Signed)
First available visit with me. No double booking.

## 2016-11-06 NOTE — Telephone Encounter (Signed)
swpt caregiver Erlene Quan to confirm 3/27 appt at 9 am per LOS

## 2016-11-28 ENCOUNTER — Telehealth: Payer: Self-pay | Admitting: Internal Medicine

## 2016-11-28 ENCOUNTER — Encounter: Payer: Self-pay | Admitting: Internal Medicine

## 2016-11-28 ENCOUNTER — Ambulatory Visit (HOSPITAL_BASED_OUTPATIENT_CLINIC_OR_DEPARTMENT_OTHER): Payer: Medicare Other | Admitting: Internal Medicine

## 2016-11-28 VITALS — BP 122/82 | HR 85 | Temp 97.7°F | Resp 18 | Ht 72.0 in | Wt 125.1 lb

## 2016-11-28 DIAGNOSIS — C3492 Malignant neoplasm of unspecified part of left bronchus or lung: Secondary | ICD-10-CM

## 2016-11-28 DIAGNOSIS — C3412 Malignant neoplasm of upper lobe, left bronchus or lung: Secondary | ICD-10-CM | POA: Diagnosis present

## 2016-11-28 DIAGNOSIS — Z5112 Encounter for antineoplastic immunotherapy: Secondary | ICD-10-CM

## 2016-11-28 DIAGNOSIS — R569 Unspecified convulsions: Secondary | ICD-10-CM

## 2016-11-28 DIAGNOSIS — R5382 Chronic fatigue, unspecified: Secondary | ICD-10-CM

## 2016-11-28 DIAGNOSIS — Z7189 Other specified counseling: Secondary | ICD-10-CM

## 2016-11-28 NOTE — Telephone Encounter (Signed)
Gave patient avs report and appointments for April and May.  °

## 2016-11-28 NOTE — Progress Notes (Signed)
Patient on plan of care prior to pathways. 

## 2016-11-28 NOTE — Progress Notes (Signed)
START ON PATHWAY REGIMEN - Non-Small Cell Lung     A cycle is every 14 days:     Nivolumab   **Always confirm dose/schedule in your pharmacy ordering system**    Patient Characteristics: Stage IV Metastatic, Squamous, PS = 0, 1, Second Line, No Prior PD-1/PD-L1  Inhibitor and Immunotherapy Candidate AJCC T Category: T2a Current Disease Status: Distant Metastases AJCC N Category: N1 AJCC M Category: M0 AJCC 8 Stage Grouping: IIB Histology: Squamous Cell Line of therapy: Second Line PD-L1 Expression Status: Test Not Ordered Performance Status: PS = 0, 1 Would you be surprised if this patient died  in the next year? I would be surprised if this patient died in the next year Immunotherapy Candidate Status: Candidate for Immunotherapy Prior Immunotherapy Status: No Prior PD-1/PD-L1 Inhibitor  Intent of Therapy: Non-Curative / Palliative Intent, Discussed with Patient

## 2016-11-28 NOTE — Progress Notes (Signed)
[name] Telephone:(336) [phone]   Fax:(336) [phone]  OFFICE PROGRESS NOTE  [name],[name], MD [address] [place] [address]  DIAGNOSIS: Stage IIA (T2a., N1, M0) non-small cell lung cancer, squamous cell carcinoma presented with central left upper lobe mass as well as hilar lymphadenopathy diagnosed in January of 2015.  PRIOR THERAPY:  1. Concurrent chemoradiation with weekly carboplatin for AUC of 2 and paclitaxel 45 mg/M2, status post 7 cycles. Last cycle was given 12/22/2013  2. Systemic chemotherapy with carboplatin for AUC of 5 and paclitaxel 175 MG/M2 with Neulasta support every 3 weeks. Status post 3 cycles. 3.  Immunotherapy with Nivolumab 3 mg/kg given every 2 weeks. Status post 9 cycles. Discontinued secondary to noncompliance but the patient has a stable disease.  CURRENT THERAPY: Treatment again with immunotherapy with Nivolumab 240 MG IV every 2 weeks. First dose 12/07/2016  INTERVAL HISTORY: Marco [name] 66 y.o. male returns to the clinic today for follow-up visit accompanied by his caregiver. The patient is feeling fine today with no specific complaints. He denied having any chest pain, shortness of breath, cough or hemoptysis. He has no fever or chills. He denied having any nausea, vomiting, diarrhea or constipation. He lost around 5 pounds since his last visit. Found on recent imaging studies to have evidence for disease progression and the patient is here today for evaluation and discussion of his treatment options.  MEDICAL HISTORY: Past Medical History:  Diagnosis Date  . DEMENTIA   . Depression   . Developmental disability    developmentaly delayed  . Encounter for antineoplastic chemotherapy 10/10/2016  . Encounter for antineoplastic immunotherapy 04/05/2015  . GERD (gastroesophageal reflux disease)   . Hx of radiation therapy 11/07/13-12/24/13   lung,60Gy/49f  . Hypertension    no medications, no documented history  per caregiver at preadmission  . Lung cancer ([facility]   . Mental disorder    sczizophrenia;moderate retardation  . Mental retardation   . Seizure disorder ([facility]   . Seizures ([facility])     ALLERGIES:  has No Known Allergies.  MEDICATIONS:  Current Outpatient Prescriptions  Medication Sig Dispense Refill  . acetaminophen (TYLENOL) 500 MG tablet Take 500 mg by mouth every 4 (four) hours as needed for mild pain or headache.     . bisacodyl (DULCOLAX) 5 MG EC tablet Take 5 mg by mouth daily as needed for mild constipation.     . diazepam (DIASTAT ACUDIAL) 10 MG GEL Place 5 mg rectally once as needed for seizure.    . divalproex (DEPAKOTE ER) 500 MG 24 hr tablet Take 1 tablet (500 mg total) by mouth 2 (two) times daily. 60 tablet 11  . donepezil (ARICEPT) 10 MG tablet Take 10 mg by mouth at bedtime.     . fluticasone (FLONASE) 50 MCG/ACT nasal spray Place 2 sprays into both nostrils daily.    .Marland KitchenHYDROcodone-homatropine (HYCODAN) 5-1.5 MG/5ML syrup Take 5 mLs by mouth every 6 (six) hours as needed for cough. 120 mL 0  . ketoconazole (NIZORAL) 2 % cream Apply 1 application topically 2 (two) times daily. Pt applies to buttocks.    . lacosamide (VIMPAT) 200 MG TABS tablet Take 1 tablet (200 mg total) by mouth 2 (two) times daily. 60 tablet 1  . levETIRAcetam (KEPPRA XR) 500 MG 24 hr tablet Take 3 tablets (1,500 mg total) by mouth 2 (two) times daily. 180 tablet 11  . loperamide (IMODIUM) 2 MG capsule Take 2  mg by mouth every 4 (four) hours as needed for diarrhea or loose stools.    Marland Kitchen LORazepam (ATIVAN) 1 MG tablet Take 2 tablets (2 mg total) by mouth at bedtime. (Patient not taking: Reported on 10/10/2016) 30 tablet 0  . omeprazole (PRILOSEC) 20 MG capsule Take 20 mg by mouth 2 (two) times daily.    . polyethylene glycol (MIRALAX / GLYCOLAX) packet Take 17 g by mouth daily as needed for mild constipation or moderate constipation.    . prochlorperazine (COMPAZINE) 10 MG tablet Take 10 mg by mouth every 6  (six) hours as needed for nausea or vomiting.    . risperiDONE (RISPERDAL) 1 MG tablet Take 1 tablet (1 mg total) by mouth at bedtime. 30 tablet 0  . traZODone (DESYREL) 50 MG tablet Take 25 mg by mouth at bedtime as needed for sleep.     No current facility-administered medications for this visit.     SURGICAL HISTORY:  Past Surgical History:  Procedure Laterality Date  . CATARACT EXTRACTION W/PHACO  07/12/2011   Procedure: CATARACT EXTRACTION PHACO AND INTRAOCULAR LENS PLACEMENT (IOC);  Surgeon: Adonis Brook;  Location: [facility];  Service: Ophthalmology;  Laterality: Left;  . EYE SURGERY     L eye  . VIDEO BRONCHOSCOPY Bilateral 10/01/2013   Procedure: VIDEO BRONCHOSCOPY WITHOUT FLUORO;  Surgeon: Collene Gobble, MD;  Location: [facility];  Service: Cardiopulmonary;  Laterality: Bilateral;    REVIEW OF SYSTEMS:  Constitutional: positive for fatigue and weight loss Eyes: negative Ears, nose, mouth, throat, and face: negative Respiratory: negative Cardiovascular: negative Gastrointestinal: negative Genitourinary:negative Integument/breast: negative Hematologic/lymphatic: negative Musculoskeletal:negative Neurological: negative Behavioral/Psych: negative Endocrine: negative Allergic/Immunologic: negative   PHYSICAL EXAMINATION: General appearance: alert, cooperative and no distress Head: Normocephalic, without obvious abnormality, atraumatic Neck: no adenopathy, no JVD, supple, symmetrical, trachea midline and thyroid not enlarged, symmetric, no tenderness/mass/nodules Lymph nodes: Cervical, supraclavicular, and axillary nodes normal. Resp: clear to auscultation bilaterally Back: symmetric, no curvature. ROM normal. No CVA tenderness. Cardio: regular rate and rhythm, S1, S2 normal, no murmur, click, rub or gallop GI: soft, non-tender; bowel sounds normal; no masses,  no organomegaly Extremities: extremities normal, atraumatic, no cyanosis or edema Neurologic: Alert and oriented X  3, normal strength and tone. Normal symmetric reflexes. Normal coordination and gait  ECOG PERFORMANCE STATUS: 1 - Symptomatic but completely ambulatory  Blood pressure 122/82, pulse 85, temperature 97.7 F (36.5 C), temperature source Oral, resp. rate 18, height 6' (1.829 m), weight 125 lb 1.6 oz (56.7 kg), SpO2 100 %.  LABORATORY DATA: Lab Results  Component Value Date   WBC 6.3 09/07/2016   HGB 14.3 09/07/2016   HCT 43.4 09/07/2016   MCV 89.5 09/07/2016   PLT 183 09/07/2016      Chemistry      Component Value Date/Time   NA 143 09/07/2016 0922   K 4.1 09/07/2016 0922   CL 107 04/24/2016 0245   CO2 29 09/07/2016 0922   BUN 11.1 09/07/2016 0922   CREATININE 1.0 09/07/2016 0922      Component Value Date/Time   CALCIUM 9.2 09/07/2016 0922   ALKPHOS 77 09/07/2016 0922   AST 9 09/07/2016 0922   ALT 7 09/07/2016 0922   BILITOT 0.39 09/07/2016 0922       RADIOGRAPHIC STUDIES: No results found. ASSESSMENT AND PLAN:  This is a very pleasant 66 years old African-American male with recurrent non-small cell lung cancer initially diagnosed as unresectable a stage II a squamous cell carcinoma. He underwent a  course of concurrent chemoradiation with weekly carboplatin and paclitaxel with partial response followed by 3 cycles of consolidation chemotherapy with carboplatin and paclitaxel discontinued secondary to disease progression. The patient was started on treatment with immunotherapy with Nivolumab status post 9 cycles but this was discontinued secondary to noncompliance. He has been on observation and palliative care for more than a year but the recent CT scan of the chest followed by PET scan showed evidence for disease recurrence and progression. I had a lengthy discussion with the patient and his caregiver about his current condition and goals of care. We discussed the option of continuous with observation on hospice care versus consideration of treatment with immunotherapy again  with Nivolumab 240 mg IV every 2 weeks. The patient and his caregiver are interested in resuming treatment. He'll start the first cycle of this treatment next week. I discussed with them the adverse effect of this treatment and would like to proceed as planned. He would come back for follow-up visit in 3 weeks for evaluation with the start of cycle #2. For the history of seizure the patient will continue his treatment with Keppra and Vimpat. He was advised to call immediately if he has any concerning symptoms in the interval. The patient voices understanding of current disease status and treatment options and is in agreement with the current care plan.  All questions were answered. The patient knows to call the clinic with any problems, questions or concerns. We can certainly see the patient much sooner if necessary.  Disclaimer: This note was dictated with voice recognition software. Similar sounding words can inadvertently be transcribed and may not be corrected upon review.

## 2016-12-07 ENCOUNTER — Other Ambulatory Visit (HOSPITAL_BASED_OUTPATIENT_CLINIC_OR_DEPARTMENT_OTHER): Payer: Medicare Other

## 2016-12-07 ENCOUNTER — Ambulatory Visit (HOSPITAL_BASED_OUTPATIENT_CLINIC_OR_DEPARTMENT_OTHER): Payer: Medicare Other

## 2016-12-07 ENCOUNTER — Other Ambulatory Visit: Payer: Self-pay | Admitting: Neurology

## 2016-12-07 DIAGNOSIS — Z5112 Encounter for antineoplastic immunotherapy: Secondary | ICD-10-CM

## 2016-12-07 DIAGNOSIS — C3492 Malignant neoplasm of unspecified part of left bronchus or lung: Secondary | ICD-10-CM

## 2016-12-07 DIAGNOSIS — R5382 Chronic fatigue, unspecified: Secondary | ICD-10-CM

## 2016-12-07 LAB — COMPREHENSIVE METABOLIC PANEL
ALT: 6 U/L (ref 0–55)
AST: 7 U/L (ref 5–34)
Albumin: 3.4 g/dL — ABNORMAL LOW (ref 3.5–5.0)
Alkaline Phosphatase: 72 U/L (ref 40–150)
Anion Gap: 9 mEq/L (ref 3–11)
BUN: 12.7 mg/dL (ref 7.0–26.0)
CHLORIDE: 108 meq/L (ref 98–109)
CO2: 29 meq/L (ref 22–29)
CREATININE: 0.9 mg/dL (ref 0.7–1.3)
Calcium: 9.1 mg/dL (ref 8.4–10.4)
EGFR: >90 mL/min/{1.73_m2} (ref >90)
GLUCOSE: 79 mg/dL (ref 70–140)
POTASSIUM: 4 meq/L (ref 3.5–5.1)
SODIUM: 146 meq/L — AB (ref 136–145)
Total Bilirubin: 0.3 mg/dL (ref 0.20–1.20)
Total Protein: 7.2 g/dL (ref 6.4–8.3)

## 2016-12-07 LAB — CBC WITH DIFFERENTIAL/PLATELET
BASO%: 0.8 % (ref 0.0–2.0)
BASOS ABS: 0 10*3/uL (ref 0.0–0.1)
EOS%: 0.4 % (ref 0.0–7.0)
Eosinophils Absolute: 0 10*3/uL (ref 0.0–0.5)
HCT: 40.5 % (ref 38.4–49.9)
HGB: 13.3 g/dL (ref 13.0–17.1)
LYMPH#: 0.8 10*3/uL — AB (ref 0.9–3.3)
LYMPH%: 14.9 % (ref 14.0–49.0)
MCH: 28.7 pg (ref 27.2–33.4)
MCHC: 32.9 g/dL (ref 32.0–36.0)
MCV: 87.3 fL (ref 79.3–98.0)
MONO#: 0.5 10*3/uL (ref 0.1–0.9)
MONO%: 8.4 % (ref 0.0–14.0)
NEUT#: 4.2 10*3/uL (ref 1.5–6.5)
NEUT%: 75.5 % — AB (ref 39.0–75.0)
Platelets: 186 10*3/uL (ref 140–400)
RBC: 4.64 10*6/uL (ref 4.20–5.82)
RDW: 13.9 % (ref 11.0–14.6)
WBC: 5.5 10*3/uL (ref 4.0–10.3)

## 2016-12-07 MED ORDER — SODIUM CHLORIDE 0.9 % IV SOLN
Freq: Once | INTRAVENOUS | Status: AC
Start: 2016-12-07 — End: 2016-12-07
  Administered 2016-12-07: 12:00:00 via INTRAVENOUS

## 2016-12-07 MED ORDER — NIVOLUMAB CHEMO INJECTION 100 MG/10ML
240.0000 mg | Freq: Once | INTRAVENOUS | Status: AC
  Administered 2016-12-07: 240 mg via INTRAVENOUS
  Filled 2016-12-07: qty 24

## 2016-12-07 NOTE — Patient Instructions (Signed)
[place] [facility] Discharge Instructions for Patients Receiving Chemotherapy  Today you received the following chemotherapy agents Nivolumab To help prevent nausea and vomiting after your treatment, we encourage you to take your nausea medication as prescribed.   If you develop nausea and vomiting that is not controlled by your nausea medication, call the clinic.   BELOW ARE SYMPTOMS THAT SHOULD BE REPORTED IMMEDIATELY:  *FEVER GREATER THAN 100.5 F  *CHILLS WITH OR WITHOUT FEVER  NAUSEA AND VOMITING THAT IS NOT CONTROLLED WITH YOUR NAUSEA MEDICATION  *UNUSUAL SHORTNESS OF BREATH  *UNUSUAL BRUISING OR BLEEDING  TENDERNESS IN MOUTH AND THROAT WITH OR WITHOUT PRESENCE OF ULCERS  *URINARY PROBLEMS  *BOWEL PROBLEMS  UNUSUAL RASH Items with * indicate a potential emergency and should be followed up as soon as possible.  Feel free to call the clinic you have any questions or concerns. The clinic phone number is [phone].  Please show the [facility] at check-in to the Emergency Department and triage nurse.  Nivolumab injection What is this medicine? NIVOLUMAB (nye VOL ue mab) is a monoclonal antibody. It is used to treat melanoma, lung cancer, kidney cancer, head and neck cancer, Hodgkin lymphoma, urothelial cancer, colon cancer, and liver cancer. This medicine may be used for other purposes; ask your health care provider or pharmacist if you have questions. COMMON BRAND NAME(S): Opdivo What should I tell my health care provider before I take this medicine? They need to know if you have any of these conditions: -diabetes -immune system problems -kidney disease -liver disease -lung disease -organ transplant -stomach or intestine problems -thyroid disease -an unusual or allergic reaction to nivolumab, other medicines, foods, dyes, or preservatives -pregnant or trying to get pregnant -breast-feeding How should I use this medicine? This medicine is for  infusion into a vein. It is given by a health care professional in a hospital or clinic setting. A special MedGuide will be given to you before each treatment. Be sure to read this information carefully each time. Talk to your pediatrician regarding the use of this medicine in children. While this drug may be prescribed for children as young as 12 years for selected conditions, precautions do apply. Overdosage: If you think you have taken too much of this medicine contact a poison control center or emergency room at once. NOTE: This medicine is only for you. Do not share this medicine with others. What if I miss a dose? It is important not to miss your dose. Call your doctor or health care professional if you are unable to keep an appointment. What may interact with this medicine? Interactions have not been studied. Give your health care provider a list of all the medicines, herbs, non-prescription drugs, or dietary supplements you use. Also tell them if you smoke, drink alcohol, or use illegal drugs. Some items may interact with your medicine. This list may not describe all possible interactions. Give your health care provider a list of all the medicines, herbs, non-prescription drugs, or dietary supplements you use. Also tell them if you smoke, drink alcohol, or use illegal drugs. Some items may interact with your medicine. What should I watch for while using this medicine? This drug may make you feel generally unwell. Continue your course of treatment even though you feel ill unless your doctor tells you to stop. You may need blood work done while you are taking this medicine. Do not become pregnant while taking this medicine or for 5 months after stopping it. Women  should inform their doctor if they wish to become pregnant or think they might be pregnant. There is a potential for serious side effects to an unborn child. Talk to your health care professional or pharmacist for more information. Do  not breast-feed an infant while taking this medicine. What side effects may I notice from receiving this medicine? Side effects that you should report to your doctor or health care professional as soon as possible: -allergic reactions like skin rash, itching or hives, swelling of the face, lips, or tongue -black, tarry stools -blood in the urine -bloody or watery diarrhea -changes in vision -change in sex drive -changes in emotions or moods -chest pain -confusion -cough -decreased appetite -diarrhea -facial flushing -feeling faint or lightheaded -fever, chills -hair loss -hallucination, loss of contact with reality -headache -irritable -joint pain -loss of memory -muscle pain -muscle weakness -seizures -shortness of breath -signs and symptoms of high blood sugar such as dizziness; dry mouth; dry skin; fruity breath; nausea; stomach pain; increased hunger or thirst; increased urination -signs and symptoms of kidney injury like trouble passing urine or change in the amount of urine -signs and symptoms of liver injury like dark yellow or brown urine; general ill feeling or flu-like symptoms; light-colored stools; loss of appetite; nausea; right upper belly pain; unusually weak or tired; yellowing of the eyes or skin -stiff neck -swelling of the ankles, feet, hands -weight gain Side effects that usually do not require medical attention (report to your doctor or health care professional if they continue or are bothersome): -bone pain -constipation -tiredness -vomiting This list may not describe all possible side effects. Call your doctor for medical advice about side effects. You may report side effects to FDA at 1-800-FDA-1088. Where should I keep my medicine? This drug is given in a hospital or clinic and will not be stored at home. NOTE: This sheet is a summary. It may not cover all possible information. If you have questions about this medicine, talk to your doctor,  pharmacist, or health care provider.  2018 Elsevier/Gold Standard (2016-05-29 17:49:34)

## 2016-12-08 LAB — TSH CHCC: TSH: 0.558 u[IU]/mL (ref 0.450–4.500)

## 2016-12-12 ENCOUNTER — Telehealth: Payer: Self-pay | Admitting: Medical Oncology

## 2016-12-12 NOTE — Telephone Encounter (Signed)
Pt doing wellafter chemo . Eating and drinking.

## 2016-12-21 ENCOUNTER — Other Ambulatory Visit (HOSPITAL_BASED_OUTPATIENT_CLINIC_OR_DEPARTMENT_OTHER): Payer: Medicare Other

## 2016-12-21 ENCOUNTER — Encounter: Payer: Self-pay | Admitting: Internal Medicine

## 2016-12-21 ENCOUNTER — Telehealth: Payer: Self-pay | Admitting: Internal Medicine

## 2016-12-21 ENCOUNTER — Ambulatory Visit (HOSPITAL_BASED_OUTPATIENT_CLINIC_OR_DEPARTMENT_OTHER): Payer: Medicare Other | Admitting: Internal Medicine

## 2016-12-21 ENCOUNTER — Ambulatory Visit (HOSPITAL_BASED_OUTPATIENT_CLINIC_OR_DEPARTMENT_OTHER): Payer: Medicare Other

## 2016-12-21 VITALS — BP 119/78 | HR 96 | Temp 98.0°F | Resp 18 | Ht 72.0 in | Wt 125.4 lb

## 2016-12-21 DIAGNOSIS — Z5112 Encounter for antineoplastic immunotherapy: Secondary | ICD-10-CM

## 2016-12-21 DIAGNOSIS — C3412 Malignant neoplasm of upper lobe, left bronchus or lung: Secondary | ICD-10-CM

## 2016-12-21 DIAGNOSIS — C3492 Malignant neoplasm of unspecified part of left bronchus or lung: Secondary | ICD-10-CM

## 2016-12-21 LAB — CBC WITH DIFFERENTIAL/PLATELET
BASO%: 0.6 % (ref 0.0–2.0)
BASOS ABS: 0 10*3/uL (ref 0.0–0.1)
EOS%: 0.5 % (ref 0.0–7.0)
Eosinophils Absolute: 0 10*3/uL (ref 0.0–0.5)
HEMATOCRIT: 38.4 % (ref 38.4–49.9)
HGB: 12.5 g/dL — ABNORMAL LOW (ref 13.0–17.1)
LYMPH%: 14.4 % (ref 14.0–49.0)
MCH: 28.2 pg (ref 27.2–33.4)
MCHC: 32.6 g/dL (ref 32.0–36.0)
MCV: 86.3 fL (ref 79.3–98.0)
MONO#: 0.6 10*3/uL (ref 0.1–0.9)
MONO%: 9.5 % (ref 0.0–14.0)
NEUT#: 4.9 10*3/uL (ref 1.5–6.5)
NEUT%: 75 % (ref 39.0–75.0)
Platelets: 169 10*3/uL (ref 140–400)
RBC: 4.45 10*6/uL (ref 4.20–5.82)
RDW: 13.6 % (ref 11.0–14.6)
WBC: 6.5 10*3/uL (ref 4.0–10.3)
lymph#: 0.9 10*3/uL (ref 0.9–3.3)

## 2016-12-21 LAB — COMPREHENSIVE METABOLIC PANEL
AST: 8 U/L (ref 5–34)
Albumin: 3 g/dL — ABNORMAL LOW (ref 3.5–5.0)
Alkaline Phosphatase: 60 U/L (ref 40–150)
Anion Gap: 9 mEq/L (ref 3–11)
BUN: 9.4 mg/dL (ref 7.0–26.0)
CALCIUM: 8.4 mg/dL (ref 8.4–10.4)
CO2: 30 meq/L — AB (ref 22–29)
CREATININE: 0.8 mg/dL (ref 0.7–1.3)
Chloride: 106 mEq/L (ref 98–109)
EGFR: >90 mL/min/{1.73_m2} (ref >90)
GLUCOSE: 81 mg/dL (ref 70–140)
POTASSIUM: 4 meq/L (ref 3.5–5.1)
SODIUM: 145 meq/L (ref 136–145)
Total Bilirubin: 0.22 mg/dL (ref 0.20–1.20)
Total Protein: 6.5 g/dL (ref 6.4–8.3)

## 2016-12-21 MED ORDER — SODIUM CHLORIDE 0.9 % IV SOLN
Freq: Once | INTRAVENOUS | Status: AC
Start: 2016-12-21 — End: 2016-12-21
  Administered 2016-12-21: 14:00:00 via INTRAVENOUS

## 2016-12-21 MED ORDER — SODIUM CHLORIDE 0.9 % IV SOLN
240.0000 mg | Freq: Once | INTRAVENOUS | Status: AC
  Administered 2016-12-21: 240 mg via INTRAVENOUS
  Filled 2016-12-21: qty 24

## 2016-12-21 NOTE — Progress Notes (Signed)
[facility] [phone]   [phone]  OFFICE PROGRESS NOTE  [name],[name], MD [address] [place] [address]  DIAGNOSIS: Stage IIA (T2a., N1, M0) non-small cell lung cancer, squamous cell carcinoma presented with central left upper lobe mass as well as hilar lymphadenopathy diagnosed in January of 2015.  PRIOR THERAPY:  1. Concurrent chemoradiation with weekly carboplatin for AUC of 2 and paclitaxel 45 mg/M2, status post 7 cycles. Last cycle was given 12/22/2013  2. Systemic chemotherapy with carboplatin for AUC of 5 and paclitaxel 175 MG/M2 with Neulasta support every 3 weeks. Status post 3 cycles. 3.  Immunotherapy with Nivolumab 3 mg/kg given every 2 weeks. Status post 9 cycles. Discontinued secondary to noncompliance but the patient has a stable disease.  CURRENT THERAPY: Treatment again with immunotherapy with Nivolumab 240 MG IV every 2 weeks. First dose 12/07/2016. Status post one cycle.  INTERVAL HISTORY: Marco [name] 66 y.o. male returns to the clinic today for follow-up visit accompanied by his caregiver. The patient is feeling fine today with no specific complaints. He tolerated the first cycle of his treatment with immunotherapy fairly well. He denied having any significant chest pain, shortness of breath except with exertion and no cough or hemoptysis. He has no significant weight loss or night sweats. He has no fever or chills. He has no nausea, vomiting, diarrhea or constipation. He is here today for evaluation before starting cycle #2.  MEDICAL HISTORY: Past Medical History:  Diagnosis Date  . DEMENTIA   . Depression   . Developmental disability    developmentaly delayed  . Encounter for antineoplastic chemotherapy 10/10/2016  . Encounter for antineoplastic immunotherapy 04/05/2015  . GERD (gastroesophageal reflux disease)   . Hx of radiation therapy 11/07/13-12/24/13   lung,60Gy/17f  . Hypertension    no  medications, no documented history per caregiver at preadmission  . [facility]   . Mental disorder    sczizophrenia;moderate retardation  . Mental retardation   . [facility]izure [facility]   . Seizures ([facility])     ALLERGIES:  has No Known Allergies.  MEDICATIONS:  Current Outpatient Prescriptions  Medication Sig Dispense Refill  . acetaminophen (TYLENOL) 500 MG tablet Take 500 mg by mouth every 4 (four) hours as needed for mild pain or headache.     . bisacodyl (DULCOLAX) 5 MG EC tablet Take 5 mg by mouth daily as needed for mild constipation.     . diazepam (DIASTAT ACUDIAL) 10 MG GEL Place 5 mg rectally once as needed for seizure.    . divalproex (DEPAKOTE ER) 500 MG 24 hr tablet Take 1 tablet (500 mg total) by mouth 2 (two) times daily. 60 tablet 11  . donepezil (ARICEPT) 10 MG tablet Take 10 mg by mouth at bedtime.     . fluticasone (FLONASE) 50 MCG/ACT nasal spray Place 2 sprays into both nostrils daily.    .Marland KitchenHYDROcodone-homatropine (HYCODAN) 5-1.5 MG/5ML syrup Take 5 mLs by mouth every 6 (six) hours as needed for cough. 120 mL 0  . ketoconazole (NIZORAL) 2 % cream Apply 1 application topically 2 (two) times daily. Pt applies to buttocks.    . lacosamide (VIMPAT) 200 MG TABS tablet Take 1 tablet (200 mg total) by mouth 2 (two) times daily. 60 tablet 1  . levETIRAcetam (KEPPRA XR) 500 MG 24 hr tablet Take 3 tablets (1,500 mg total) by mouth 2 (two) times daily. 180 tablet 11  . loperamide (  IMODIUM) 2 MG capsule Take 2 mg by mouth every 4 (four) hours as needed for diarrhea or loose stools.    Marland Kitchen LORazepam (ATIVAN) 1 MG tablet Take 2 tablets (2 mg total) by mouth at bedtime. 30 tablet 0  . omeprazole (PRILOSEC) 20 MG capsule Take 20 mg by mouth 2 (two) times daily.    . polyethylene glycol (MIRALAX / GLYCOLAX) packet Take 17 g by mouth daily as needed for mild constipation or moderate constipation.    . prochlorperazine (COMPAZINE) 10 MG tablet Take 10 mg by mouth every 6 (six) hours  as needed for nausea or vomiting.    . risperiDONE (RISPERDAL) 1 MG tablet Take 1 tablet (1 mg total) by mouth at bedtime. 30 tablet 0  . traZODone (DESYREL) 50 MG tablet Take 25 mg by mouth at bedtime as needed for sleep.     No current facility-administered medications for this visit.     SURGICAL HISTORY:  Past Surgical History:  Procedure Laterality Date  . CATARACT EXTRACTION W/PHACO  07/12/2011   Procedure: CATARACT EXTRACTION PHACO AND INTRAOCULAR LENS PLACEMENT (IOC);  Surgeon: Adonis Brook;  Location: [facility];  Service: Ophthalmology;  Laterality: Left;  . EYE SURGERY     L eye  . VIDEO BRONCHOSCOPY Bilateral 10/01/2013   Procedure: VIDEO BRONCHOSCOPY WITHOUT FLUORO;  Surgeon: Collene Gobble, MD;  Location: [facility];  Service: Cardiopulmonary;  Laterality: Bilateral;    REVIEW OF SYSTEMS:  A comprehensive review of systems was negative except for: Respiratory: positive for dyspnea on exertion   PHYSICAL EXAMINATION: General appearance: alert, cooperative and no distress Head: Normocephalic, without obvious abnormality, atraumatic Neck: no adenopathy, no JVD, supple, symmetrical, trachea midline and thyroid not enlarged, symmetric, no tenderness/mass/nodules Lymph nodes: Cervical, supraclavicular, and axillary nodes normal. Resp: clear to auscultation bilaterally Back: symmetric, no curvature. ROM normal. No CVA tenderness. Cardio: regular rate and rhythm, S1, S2 normal, no murmur, click, rub or gallop GI: soft, non-tender; bowel sounds normal; no masses,  no organomegaly Extremities: extremities normal, atraumatic, no cyanosis or edema  ECOG PERFORMANCE STATUS: 1 - Symptomatic but completely ambulatory  Blood pressure 119/78, pulse 96, temperature 98 F (36.7 C), temperature source Oral, resp. rate 18, height 6' (1.829 m), weight 125 lb 6.4 oz (56.9 kg), SpO2 100 %.  LABORATORY DATA: Lab Results  Component Value Date   WBC 6.5 12/21/2016   HGB 12.5 (L) 12/21/2016    HCT 38.4 12/21/2016   MCV 86.3 12/21/2016   PLT 169 12/21/2016      Chemistry      Component Value Date/Time   NA 145 12/21/2016 1156   K 4.0 12/21/2016 1156   CL 107 04/24/2016 0245   CO2 30 (H) 12/21/2016 1156   BUN 9.4 12/21/2016 1156   CREATININE 0.8 12/21/2016 1156      Component Value Date/Time   CALCIUM 8.4 12/21/2016 1156   ALKPHOS 60 12/21/2016 1156   AST 8 12/21/2016 1156   ALT <6 12/21/2016 1156   BILITOT 0.22 12/21/2016 1156       RADIOGRAPHIC STUDIES: No results found. ASSESSMENT AND PLAN:  This is a very pleasant 66 years old African-American male with recurrent non-small cell lung cancer, squamous cell carcinoma that was initially diagnosed as unresectable a stage IIA, status post concurrent chemoradiation followed by 3 cycles of consolidation chemotherapy. The patient is currently on treatment with immunotherapy with Nivolumab status post 1 cycle. He tolerated the first cycle of this treatment well with no significant adverse  effects. I recommended for him to proceed with cycle #2 today as scheduled. He would come back for follow-up visit in 2 weeks for evaluation before starting cycle #3. The patient was advised to call immediately if he has any concerning symptoms in the interval. The patient voices understanding of current disease status and treatment options and is in agreement with the current care plan. All questions were answered. The patient knows to call the clinic with any problems, questions or concerns. We can certainly see the patient much sooner if necessary. I spent 10 minutes counseling the patient face to face. The total time spent in the appointment was 15 minutes.  Disclaimer: This note was dictated with voice recognition software. Similar sounding words can inadvertently be transcribed and may not be corrected upon review.

## 2016-12-21 NOTE — Patient Instructions (Signed)
[facility] Discharge Instructions for Patients Receiving Chemotherapy  Today you received the following chemotherapy agents Nivolumab To help prevent nausea and vomiting after your treatment, we encourage you to take your nausea medication as prescribed.   If you develop nausea and vomiting that is not controlled by your nausea medication, call the clinic.   BELOW ARE SYMPTOMS THAT SHOULD BE REPORTED IMMEDIATELY:  *FEVER GREATER THAN 100.5 F  *CHILLS WITH OR WITHOUT FEVER  NAUSEA AND VOMITING THAT IS NOT CONTROLLED WITH YOUR NAUSEA MEDICATION  *UNUSUAL SHORTNESS OF BREATH  *UNUSUAL BRUISING OR BLEEDING  TENDERNESS IN MOUTH AND THROAT WITH OR WITHOUT PRESENCE OF ULCERS  *URINARY PROBLEMS  *BOWEL PROBLEMS  UNUSUAL RASH Items with * indicate a potential emergency and should be followed up as soon as possible.  Feel free to call the clinic you have any questions or concerns. The clinic phone number is [phone].  Please show the [facility] at check-in to the Emergency Department and triage nurse.  Nivolumab injection What is this medicine? NIVOLUMAB (nye VOL ue mab) is a monoclonal antibody. It is used to treat melanoma, lung cancer, kidney cancer, head and neck cancer, Hodgkin lymphoma, urothelial cancer, colon cancer, and liver cancer. This medicine may be used for other purposes; ask your health care provider or pharmacist if you have questions. COMMON BRAND NAME(S): Opdivo What should I tell my health care provider before I take this medicine? They need to know if you have any of these conditions: -diabetes -immune system problems -kidney disease -liver disease -lung disease -organ transplant -stomach or intestine problems -thyroid disease -an unusual or allergic reaction to nivolumab, other medicines, foods, dyes, or preservatives -pregnant or trying to get pregnant -breast-feeding How should I use this medicine? This medicine is for  infusion into a vein. It is given by a health care professional in a hospital or clinic setting. A special MedGuide will be given to you before each treatment. Be sure to read this information carefully each time. Talk to your pediatrician regarding the use of this medicine in children. While this drug may be prescribed for children as young as 12 years for selected conditions, precautions do apply. Overdosage: If you think you have taken too much of this medicine contact a poison control center or emergency room at once. NOTE: This medicine is only for you. Do not share this medicine with others. What if I miss a dose? It is important not to miss your dose. Call your doctor or health care professional if you are unable to keep an appointment. What may interact with this medicine? Interactions have not been studied. Give your health care provider a list of all the medicines, herbs, non-prescription drugs, or dietary supplements you use. Also tell them if you smoke, drink alcohol, or use illegal drugs. Some items may interact with your medicine. This list may not describe all possible interactions. Give your health care provider a list of all the medicines, herbs, non-prescription drugs, or dietary supplements you use. Also tell them if you smoke, drink alcohol, or use illegal drugs. Some items may interact with your medicine. What should I watch for while using this medicine? This drug may make you feel generally unwell. Continue your course of treatment even though you feel ill unless your doctor tells you to stop. You may need blood work done while you are taking this medicine. Do not become pregnant while taking this medicine or for 5 months after stopping it. Women  should inform their doctor if they wish to become pregnant or think they might be pregnant. There is a potential for serious side effects to an unborn child. Talk to your health care professional or pharmacist for more information. Do  not breast-feed an infant while taking this medicine. What side effects may I notice from receiving this medicine? Side effects that you should report to your doctor or health care professional as soon as possible: -allergic reactions like skin rash, itching or hives, swelling of the face, lips, or tongue -black, tarry stools -blood in the urine -bloody or watery diarrhea -changes in vision -change in sex drive -changes in emotions or moods -chest pain -confusion -cough -decreased appetite -diarrhea -facial flushing -feeling faint or lightheaded -fever, chills -hair loss -hallucination, loss of contact with reality -headache -irritable -joint pain -loss of memory -muscle pain -muscle weakness -seizures -shortness of breath -signs and symptoms of high blood sugar such as dizziness; dry mouth; dry skin; fruity breath; nausea; stomach pain; increased hunger or thirst; increased urination -signs and symptoms of kidney injury like trouble passing urine or change in the amount of urine -signs and symptoms of liver injury like dark yellow or brown urine; general ill feeling or flu-like symptoms; light-colored stools; loss of appetite; nausea; right upper belly pain; unusually weak or tired; yellowing of the eyes or skin -stiff neck -swelling of the ankles, feet, hands -weight gain Side effects that usually do not require medical attention (report to your doctor or health care professional if they continue or are bothersome): -bone pain -constipation -tiredness -vomiting This list may not describe all possible side effects. Call your doctor for medical advice about side effects. You may report side effects to FDA at 1-800-FDA-1088. Where should I keep my medicine? This drug is given in a hospital or clinic and will not be stored at home. NOTE: This sheet is a summary. It may not cover all possible information. If you have questions about this medicine, talk to your doctor,  pharmacist, or health care provider.  2018 Elsevier/Gold Standard (2016-05-29 17:49:34)

## 2016-12-21 NOTE — Telephone Encounter (Signed)
Scheduled additional appt per 4/19 los. Patient to receive new schedule next visit

## 2016-12-25 ENCOUNTER — Telehealth: Payer: Self-pay

## 2016-12-25 ENCOUNTER — Ambulatory Visit: Payer: Medicare Other | Admitting: Neurology

## 2016-12-25 NOTE — Telephone Encounter (Signed)
Caregiver called and r/s same day appt d/t sickness after re-starting chemo.

## 2016-12-28 ENCOUNTER — Telehealth: Payer: Self-pay | Admitting: Medical Oncology

## 2016-12-28 ENCOUNTER — Other Ambulatory Visit: Payer: Self-pay | Admitting: Medical Oncology

## 2016-12-28 DIAGNOSIS — C3490 Malignant neoplasm of unspecified part of unspecified bronchus or lung: Secondary | ICD-10-CM

## 2016-12-28 MED ORDER — HYDROCODONE-HOMATROPINE 5-1.5 MG/5ML PO SYRP: 5.0000 mL | ORAL_SOLUTION | Freq: Four times a day (QID) | ORAL | 0 refills | Status: DC | PRN

## 2016-12-28 NOTE — Telephone Encounter (Signed)
Marco [name] requested refill on hycodan . Pt completed 2 doses of nivolumab.Marco [name] said Sherron cough is no worse- " There is no uptick in his cough . He has a cough in early am and at night" Takes it bid.  Last dose was yesterday . Last refill was feb.

## 2016-12-31 ENCOUNTER — Emergency Department (HOSPITAL_COMMUNITY): Payer: Medicare Other

## 2016-12-31 ENCOUNTER — Observation Stay (HOSPITAL_COMMUNITY)
Admission: EM | Admit: 2016-12-31 | Discharge: 2017-01-01 | Disposition: A | Discharge status: Home / Self Care | Payer: Medicare Other | Attending: Internal Medicine | Admitting: Internal Medicine

## 2016-12-31 ENCOUNTER — Encounter (HOSPITAL_COMMUNITY): Payer: Self-pay

## 2016-12-31 DIAGNOSIS — F209 Schizophrenia, unspecified: Secondary | ICD-10-CM | POA: Diagnosis not present

## 2016-12-31 DIAGNOSIS — G40909 Epilepsy, unspecified, not intractable, without status epilepticus: Secondary | ICD-10-CM | POA: Diagnosis present

## 2016-12-31 DIAGNOSIS — I739 Peripheral vascular disease, unspecified: Secondary | ICD-10-CM | POA: Diagnosis not present

## 2016-12-31 DIAGNOSIS — C3490 Malignant neoplasm of unspecified part of unspecified bronchus or lung: Secondary | ICD-10-CM | POA: Diagnosis present

## 2016-12-31 DIAGNOSIS — Z7951 Long term (current) use of inhaled steroids: Secondary | ICD-10-CM | POA: Diagnosis not present

## 2016-12-31 DIAGNOSIS — Z9842 Cataract extraction status, left eye: Secondary | ICD-10-CM | POA: Diagnosis not present

## 2016-12-31 DIAGNOSIS — F329 Major depressive disorder, single episode, unspecified: Secondary | ICD-10-CM | POA: Insufficient documentation

## 2016-12-31 DIAGNOSIS — R1310 Dysphagia, unspecified: Secondary | ICD-10-CM | POA: Diagnosis not present

## 2016-12-31 DIAGNOSIS — R2689 Other abnormalities of gait and mobility: Secondary | ICD-10-CM | POA: Insufficient documentation

## 2016-12-31 DIAGNOSIS — Z79891 Long term (current) use of opiate analgesic: Secondary | ICD-10-CM | POA: Diagnosis not present

## 2016-12-31 DIAGNOSIS — K219 Gastro-esophageal reflux disease without esophagitis: Secondary | ICD-10-CM | POA: Insufficient documentation

## 2016-12-31 DIAGNOSIS — Z8249 Family history of ischemic heart disease and other diseases of the circulatory system: Secondary | ICD-10-CM | POA: Insufficient documentation

## 2016-12-31 DIAGNOSIS — Z961 Presence of intraocular lens: Secondary | ICD-10-CM | POA: Diagnosis not present

## 2016-12-31 DIAGNOSIS — Z923 Personal history of irradiation: Secondary | ICD-10-CM | POA: Insufficient documentation

## 2016-12-31 DIAGNOSIS — Z9889 Other specified postprocedural states: Secondary | ICD-10-CM | POA: Diagnosis not present

## 2016-12-31 DIAGNOSIS — R569 Unspecified convulsions: Secondary | ICD-10-CM | POA: Diagnosis present

## 2016-12-31 DIAGNOSIS — Z85118 Personal history of other malignant neoplasm of bronchus and lung: Secondary | ICD-10-CM | POA: Diagnosis not present

## 2016-12-31 DIAGNOSIS — F72 Severe intellectual disabilities: Secondary | ICD-10-CM | POA: Diagnosis not present

## 2016-12-31 DIAGNOSIS — Z87891 Personal history of nicotine dependence: Secondary | ICD-10-CM | POA: Diagnosis not present

## 2016-12-31 DIAGNOSIS — Z9221 Personal history of antineoplastic chemotherapy: Secondary | ICD-10-CM | POA: Insufficient documentation

## 2016-12-31 DIAGNOSIS — G40409 Other generalized epilepsy and epileptic syndromes, not intractable, without status epilepticus: Principal | ICD-10-CM | POA: Insufficient documentation

## 2016-12-31 DIAGNOSIS — F0390 Unspecified dementia without behavioral disturbance: Secondary | ICD-10-CM | POA: Diagnosis not present

## 2016-12-31 DIAGNOSIS — E876 Hypokalemia: Secondary | ICD-10-CM | POA: Diagnosis present

## 2016-12-31 DIAGNOSIS — Z79899 Other long term (current) drug therapy: Secondary | ICD-10-CM | POA: Insufficient documentation

## 2016-12-31 LAB — COMPREHENSIVE METABOLIC PANEL
ALT: 13 U/L — AB (ref 17–63)
AST: 26 U/L (ref 15–41)
Albumin: 3.4 g/dL — ABNORMAL LOW (ref 3.5–5.0)
Alkaline Phosphatase: 62 U/L (ref 38–126)
Anion gap: 15 (ref 5–15)
BUN: 14 mg/dL (ref 6–20)
CHLORIDE: 99 mmol/L — AB (ref 101–111)
CO2: 21 mmol/L — ABNORMAL LOW (ref 22–32)
CREATININE: 1.14 mg/dL (ref 0.61–1.24)
Calcium: 8.4 mg/dL — ABNORMAL LOW (ref 8.9–10.3)
Glucose, Bld: 164 mg/dL — ABNORMAL HIGH (ref 65–99)
Potassium: 3.3 mmol/L — ABNORMAL LOW (ref 3.5–5.1)
Sodium: 135 mmol/L (ref 135–145)
Total Bilirubin: 0.8 mg/dL (ref 0.3–1.2)
Total Protein: 7.4 g/dL (ref 6.5–8.1)

## 2016-12-31 LAB — CBC WITH DIFFERENTIAL/PLATELET
Basophils Absolute: 0 10*3/uL (ref 0.0–0.1)
Basophils Relative: 0 %
EOS PCT: 0 %
Eosinophils Absolute: 0 10*3/uL (ref 0.0–0.7)
HCT: 40.5 % (ref 39.0–52.0)
Hemoglobin: 12.8 g/dL — ABNORMAL LOW (ref 13.0–17.0)
LYMPHS ABS: 1.9 10*3/uL (ref 0.7–4.0)
LYMPHS PCT: 24 %
MCH: 28.2 pg (ref 26.0–34.0)
MCHC: 31.6 g/dL (ref 30.0–36.0)
MCV: 89.2 fL (ref 78.0–100.0)
MONO ABS: 0.5 10*3/uL (ref 0.1–1.0)
MONOS PCT: 6 %
Neutro Abs: 5.5 10*3/uL (ref 1.7–7.7)
Neutrophils Relative %: 70 %
PLATELETS: 178 10*3/uL (ref 150–400)
RBC: 4.54 MIL/uL (ref 4.22–5.81)
RDW: 13.3 % (ref 11.5–15.5)
WBC: 7.9 10*3/uL (ref 4.0–10.5)

## 2016-12-31 LAB — URINALYSIS, ROUTINE W REFLEX MICROSCOPIC
Bacteria, UA: NONE SEEN
Bilirubin Urine: NEGATIVE
Glucose, UA: NEGATIVE mg/dL
KETONES UR: NEGATIVE mg/dL
Leukocytes, UA: NEGATIVE
NITRITE: NEGATIVE
PH: 5 (ref 5.0–8.0)
Protein, ur: NEGATIVE mg/dL
RBC / HPF: NONE SEEN RBC/hpf (ref 0–5)
SPECIFIC GRAVITY, URINE: 1.019 (ref 1.005–1.030)
Squamous Epithelial / LPF: NONE SEEN

## 2016-12-31 LAB — CBG MONITORING, ED: Glucose-Capillary: 132 mg/dL — ABNORMAL HIGH (ref 65–99)

## 2016-12-31 LAB — VALPROIC ACID LEVEL: Valproic Acid Lvl: 25 ug/mL — ABNORMAL LOW (ref 50.0–100.0)

## 2016-12-31 MED ORDER — DEXTROSE 5 % IV SOLN
500.0000 mg | Freq: Once | INTRAVENOUS | Status: AC
  Administered 2016-12-31: 500 mg via INTRAVENOUS
  Filled 2016-12-31: qty 5

## 2016-12-31 MED ORDER — POTASSIUM CHLORIDE CRYS ER 20 MEQ PO TBCR
40.0000 meq | EXTENDED_RELEASE_TABLET | Freq: Once | ORAL | Status: AC
  Administered 2016-12-31: 40 meq via ORAL
  Filled 2016-12-31: qty 2

## 2016-12-31 MED ORDER — LORAZEPAM 2 MG/ML IJ SOLN
INTRAMUSCULAR | Status: AC
  Administered 2016-12-31: 1 mg via INTRAVENOUS
  Filled 2016-12-31: qty 1

## 2016-12-31 MED ORDER — SODIUM CHLORIDE 0.9 % IV BOLUS (SEPSIS)
1000.0000 mL | Freq: Once | INTRAVENOUS | Status: AC
  Administered 2016-12-31: 1000 mL via INTRAVENOUS

## 2016-12-31 MED ORDER — DIVALPROEX SODIUM 250 MG PO DR TAB: 500.0000 mg | DELAYED_RELEASE_TABLET | Freq: Once | ORAL | Status: DC

## 2016-12-31 MED ORDER — LORAZEPAM 2 MG/ML IJ SOLN
1.0000 mg | Freq: Once | INTRAMUSCULAR | Status: AC
  Administered 2016-12-31: 1 mg via INTRAVENOUS

## 2016-12-31 MED ORDER — SODIUM CHLORIDE 0.9 % IV SOLN
1500.0000 mg | Freq: Once | INTRAVENOUS | Status: AC
  Administered 2016-12-31: 1500 mg via INTRAVENOUS
  Filled 2016-12-31: qty 15

## 2016-12-31 NOTE — ED Notes (Signed)
Attempted to ambulate per Dr. Jearld Pies. Pt ambulatory with +1 assist, however pt appears unsteady and gait skewed sideways. Per pt caregiver, pt typically ambulatory without difficulty or assistive devices. Dr. Jearld Pies aware. Pt placed back in bed by this nurse.

## 2016-12-31 NOTE — ED Provider Notes (Signed)
[facility] DEPT Provider Note   CSN: [id number] Arrival date & time: 12/31/16  1933     History   Chief Complaint Chief Complaint  Patient presents with  . Seizures    HPI Marco [name] is a 66 y.o. male.  The history is provided by the patient, the EMS personnel and a caregiver.  Seizures   This is a chronic problem. The current episode started less than 1 hour ago. The problem has been resolved. There was 1 seizure. The most recent episode lasted 30 to 120 seconds. Associated symptoms include confusion. Pertinent negatives include no speech difficulty, no neck stiffness, no cough, no vomiting and no diarrhea. Characteristics include rhythmic jerking and apnea. Characteristics do not include bladder incontinence or bit tongue. The episode was witnessed. The seizures continued in the ED. The seizure(s) had no focality. Possible causes do not include medication or dosage change, missed seizure meds or recent illness. There has been no fever. There were no medications administered prior to arrival.    Past Medical History:  Diagnosis Date  . DEMENTIA   . Depression   . Developmental disability    developmentaly delayed  . Encounter for antineoplastic chemotherapy 10/10/2016  . Encounter for antineoplastic immunotherapy 04/05/2015  . Encounter for antineoplastic immunotherapy 12/21/2016  . GERD (gastroesophageal reflux disease)   . Hx of radiation therapy 11/07/13-12/24/13   lung,60Gy/65f  . Hypertension    no medications, no documented history per caregiver at preadmission  . Lung cancer ([facility]   . Mental disorder    sczizophrenia;moderate retardation  . Mental retardation   . Seizure disorder ([facility]   . Seizures ([facility]     Patient Active Problem List   Diagnosis Date Noted  . Encounter for antineoplastic immunotherapy 12/21/2016  . Encounter for antineoplastic chemotherapy 10/10/2016  . Hypertension 09/14/2016  . AKI (acute kidney injury) ([facility] 04/23/2016  . Acute on  chronic respiratory failure with hypoxemia ([facility] 04/23/2016  . Protein-calorie malnutrition, severe ([facility] 01/28/2016  . Altered mental status   . Post-ictal confusion   . Acute encephalopathy 01/25/2016  . Squamous cell carcinoma of lung ([facility] 01/25/2016  . Seizure disorder ([facility] 01/25/2016  . Seizure ([facility] 08/22/2015  . Malnutrition of moderate degree 07/02/2015  . Sepsis ([facility] 07/01/2015  . UTI (urinary tract infection) 07/01/2015  . ARF (acute renal failure) ([facility] 07/01/2015  . Dementia without behavioral disturbance 07/01/2015  . Leukocytosis 07/01/2015  . Hypokalemia 07/01/2015  . Hypernatremia 07/01/2015  . Convulsions/seizures ([facility] 05/20/2015  . Acute respiratory failure with hypoxia ([facility]   . Goals of care, counseling/discussion 04/05/2015  . Squamous cell lung cancer ([facility] 09/10/2013  . IQ 20-34 (severe mental retardation) 10/29/2012    Past Surgical History:  Procedure Laterality Date  . CATARACT EXTRACTION W/PHACO  07/12/2011   Procedure: CATARACT EXTRACTION PHACO AND INTRAOCULAR LENS PLACEMENT (IOC);  Surgeon: GAdonis Brook  [facility];  Service: Ophthalmology;  Laterality: Left;  . EYE SURGERY     L eye  . VIDEO BRONCHOSCOPY Bilateral 10/01/2013   Procedure: VIDEO BRONCHOSCOPY WITHOUT FLUORO;  Surgeon: RCollene Gobble MD;  [facility]  Service: Cardiopulmonary;  Laterality: Bilateral;       Home Medications    Prior to Admission medications   Medication Sig Start Date End Date Taking? Authorizing Provider  acetaminophen (TYLENOL) 500 MG tablet Take 500 mg by mouth every 4 (four) hours as needed for mild pain or headache.     Historical Provider, MD  bisacodyl (DULCOLAX) 5 MG  EC tablet Take 5 mg by mouth daily as needed for mild constipation.     Historical Provider, MD  diazepam (DIASTAT ACUDIAL) 10 MG GEL Place 5 mg rectally once as needed for seizure.    Historical Provider, MD  divalproex (DEPAKOTE ER) 500 MG 24 hr tablet Take 1 tablet (500  mg total) by mouth 2 (two) times daily. 02/07/16   Melvenia Beam, MD  donepezil (ARICEPT) 10 MG tablet Take 10 mg by mouth at bedtime.     Historical Provider, MD  fluticasone (FLONASE) 50 MCG/ACT nasal spray Place 2 sprays into both nostrils daily.    Historical Provider, MD  HYDROcodone-homatropine (HYCODAN) 5-1.5 MG/5ML syrup Take 5 mLs by mouth every 6 (six) hours as needed for cough. 12/28/16   Ladell Pier, MD  ketoconazole (NIZORAL) 2 % cream Apply 1 application topically 2 (two) times daily. Pt applies to buttocks.    Historical Provider, MD  lacosamide (VIMPAT) 200 MG TABS tablet Take 1 tablet (200 mg total) by mouth 2 (two) times daily. 11/06/16   Melvenia Beam, MD  levETIRAcetam (KEPPRA XR) 500 MG 24 hr tablet Take 3 tablets (1,500 mg total) by mouth 2 (two) times daily. 02/07/16   Melvenia Beam, MD  loperamide (IMODIUM) 2 MG capsule Take 2 mg by mouth every 4 (four) hours as needed for diarrhea or loose stools.    Historical Provider, MD  LORazepam (ATIVAN) 1 MG tablet Take 2 tablets (2 mg total) by mouth at bedtime. 04/24/16   Clanford Marisa Hua, MD  omeprazole (PRILOSEC) 20 MG capsule Take 20 mg by mouth 2 (two) times daily.    Historical Provider, MD  polyethylene glycol (MIRALAX / GLYCOLAX) packet Take 17 g by mouth daily as needed for mild constipation or moderate constipation.    Historical Provider, MD  prochlorperazine (COMPAZINE) 10 MG tablet Take 10 mg by mouth every 6 (six) hours as needed for nausea or vomiting.    Historical Provider, MD  risperiDONE (RISPERDAL) 1 MG tablet Take 1 tablet (1 mg total) by mouth at bedtime. 08/25/15   Theodis Blaze, MD  traZODone (DESYREL) 50 MG tablet Take 25 mg by mouth at bedtime as needed for sleep.    Historical Provider, MD    Family History Family History  Problem Relation Age of Onset  . Hypertension Mother   . Hypertension Father     Social History Social History  Substance Use Topics  . Smoking status: Former Smoker     Packs/day: 0.50    Years: 35.00    Types: Cigarettes    Quit date: 09/07/2013  . Smokeless tobacco: Never Used  . Alcohol use No     Allergies   Patient has no known allergies.   Review of Systems Review of Systems  Unable to perform ROS: Other (Pt with severe mental retardation)  Respiratory: Positive for apnea. Negative for cough.   Gastrointestinal: Negative for diarrhea and vomiting.  Genitourinary: Negative for bladder incontinence.  Neurological: Positive for seizures. Negative for speech difficulty.  Psychiatric/Behavioral: Positive for confusion.     Physical Exam Updated Vital Signs BP 104/72   Pulse 88   Temp 97.2 F (36.2 C) (Oral)   Resp (!) 28   Ht 6' (1.829 m)   Wt 56.7 kg   SpO2 100%   BMI 16.95 kg/m   Physical Exam  Constitutional: He appears well-developed and well-nourished.  HENT:  Head: Normocephalic and atraumatic.  Eyes: Conjunctivae and EOM are normal.  Pupils are equal, round, and reactive to light.  Neck: Neck supple.  Cardiovascular: Normal rate and regular rhythm.   No murmur heard. Pulmonary/Chest: Effort normal and breath sounds normal. No respiratory distress.  Abdominal: Soft. There is no tenderness.  Musculoskeletal: He exhibits no edema.  Neurological: He is alert. He has normal strength. No cranial nerve deficit or sensory deficit. GCS eye subscore is 4. GCS verbal subscore is 4. GCS motor subscore is 6.  Skin: Skin is warm and dry.  Psychiatric: He has a normal mood and affect.  Nursing note and vitals reviewed.    ED Treatments / Results  Labs (all labs ordered are listed, but only abnormal results are displayed) Labs Reviewed  VALPROIC ACID LEVEL - Abnormal; Notable for the following:       Result Value   Valproic Acid Lvl 25 (*)    All other components within normal limits  CBC WITH DIFFERENTIAL/PLATELET - Abnormal; Notable for the following:    Hemoglobin 12.8 (*)    All other components within normal limits    COMPREHENSIVE METABOLIC PANEL - Abnormal; Notable for the following:    Potassium 3.3 (*)    Chloride 99 (*)    CO2 21 (*)    Glucose, Bld 164 (*)    Calcium 8.4 (*)    Albumin 3.4 (*)    ALT 13 (*)    All other components within normal limits  URINALYSIS, ROUTINE W REFLEX MICROSCOPIC - Abnormal; Notable for the following:    Hgb urine dipstick SMALL (*)    All other components within normal limits  CBG MONITORING, ED - Abnormal; Notable for the following:    Glucose-Capillary 132 (*)    All other components within normal limits    EKG  EKG Interpretation  Date/Time:  Sunday December 31 2016 19:44:37 EDT Ventricular Rate:  111 PR Interval:    QRS Duration: 54 QT Interval:  340 QTC Calculation: 462 R Axis:   77 Text Interpretation:  Sinus tachycardia Borderline low voltage, extremity leads Probable anteroseptal infarct, old Nonspecific T abnormalities, lateral leads Minimal ST elevation, inferior leads No change vs 8/17 Confirmed by Jeneen Rinks  MD, [place] ([id number]) on 12/31/2016 8:02:48 PM       Radiology Ct Head Wo Contrast  Result Date: 12/31/2016 CLINICAL DATA:  Postictal.  History of lung cancer. EXAM: CT HEAD WITHOUT CONTRAST TECHNIQUE: Contiguous axial images were obtained from the base of the skull through the vertex without intravenous contrast. COMPARISON:  04/23/2016 FINDINGS: Brain: No evidence of acute infarction, hemorrhage, hydrocephalus, extra-axial collection or mass lesion/mass effect. Mild brain parenchymal volume loss and periventricular microangiopathy. Vascular: No hyperdense vessel. Skull: Normal. Negative for fracture or focal lesion. Sinuses/Orbits: No acute finding. Other: None. IMPRESSION: No acute intracranial abnormality. Mild brain parenchymal atrophy and chronic microvascular disease. Electronically Signed   By: Fidela Salisbury M.D.   On: 12/31/2016 21:46    Procedures Procedures (including critical care time)  Medications Ordered in ED Medications   valproate (DEPACON) 500 mg in dextrose 5 % 50 mL IVPB (500 mg Intravenous New Bag/Given 12/31/16 2143)  potassium chloride SA (K-DUR,KLOR-CON) CR tablet 40 mEq (not administered)  levETIRAcetam (KEPPRA) 1,500 mg in sodium chloride 0.9 % 100 mL IVPB (0 mg Intravenous Stopped 12/31/16 2039)  sodium chloride 0.9 % bolus 1,000 mL (0 mLs Intravenous Stopped 12/31/16 2153)  LORazepam (ATIVAN) injection 1 mg (1 mg Intravenous Given 12/31/16 1945)     Initial Impression / Assessment and Plan / ED Course  I have reviewed the triage vital signs and the nursing notes.  Pertinent labs & imaging results that were available during my care of the patient were reviewed by me and considered in my medical decision making (see chart for details).     66 year old male with history of severe mental retardation, seizure disorder presents in the setting of seizure. Patient had a tonic clonic seizure today and he has not had one several weeks and for this EMS was called.  On arrival in emergency Department patient had a another seizure which resolved prior to intervention. Patient was given Ativan. Caregiver arrived and reports no recent head trauma or noncompliance with medication. No recent illnesses. Patient with no focal neurologic deficit. Patient has confuse verbal response at baseline. Patient with no trauma noted on examination. CT head obtained without acute intracranial laterality. Urinalysis without signs of infection. Patient with mild hypokalemia which was repleted and no other significant electrolyte abnormality's. Patient was given IV Keppra as he takes his Keppra normally at night. Additionally Depakote level was found to be low. Patient given supplemental Depakote.  Patient continued to be slightly slow compared to baseline per caregiver. Patient had very unsteady gait and caregiver does not feel safe taking patient home. Likely related postictal state and at this time we'll admit to hospitalist for  further management overnight in setting of multiple seizures. Stable at time of transfer of care.  Final Clinical Impressions(s) / ED Diagnoses   Final diagnoses:  Seizure ([place])  Hypokalemia    New Prescriptions New Prescriptions   No medications on file     Esaw Grandchild, MD 12/31/16 2221    Esaw Grandchild, MD 12/31/16 2316    Tanna Furry, MD 01/15/17 [phone]

## 2016-12-31 NOTE — ED Notes (Signed)
While receiving report from EMS, pt began seizing. Dr. Jeneen Rinks and Dr. Jearld Pies notified and aware. '1mg'$  ativan IV push given. Episode lasted approx 2-2 and a half minutes. Pt 98% on RA. Positioned side facing with emesis bag available.

## 2016-12-31 NOTE — ED Provider Notes (Signed)
Pt seen on arrival with Dr. Jearld Pies.  History of sz and MR. On Depakote and Keppra. Sz at home. Sz on arrival here. Generalized. Head and neck drawn to left. Given iv ativan. Resolves prior to ativan.   Will obtain Ct head, labs, load iv Keppra, check depakote leval and add PRN.   Tanna Furry, MD 12/31/16 [phone]

## 2016-12-31 NOTE — ED Notes (Addendum)
Pt POA and caregiver is leaving for the evening.   [name]:  [phone]

## 2016-12-31 NOTE — ED Notes (Signed)
Caregiver at the bedside, this nurse offered to have safety sitter with pt. Pt caregiver reports he will ensure the pt remains safe and in the bed. Pt caregiver ensured this nurse he would notify staff when he planned to leave to ensure a safety sitter was present for pt. Will continue to round on pt.

## 2016-12-31 NOTE — ED Notes (Signed)
Pt appears restless and has a history of removing his IVs and tugging at cords. Pt placed in soft mitts, Dr. Jearld Pies aware.

## 2016-12-31 NOTE — ED Triage Notes (Signed)
Pt with hx of seizures from home via [facility] s/p grand mal seizure. Per EMS, pt at baseline per caregiver, hx of MR and dementia, able to answer basic questions such as his name. Pt currently on depakote and keppra, no recent change in dosage. Per EMS, pt post ictal on arrival 91% on RA, pt at 100% on RA on arrival after pt placed on non rebreather. Last known seizure in Aug 2017. 95 CBG, 149/94, HR 110, 100% RA. 20 G L forearm.

## 2016-12-31 NOTE — ED Notes (Addendum)
Per pt caregiver, pt has been assessed multiple times by speech therapy and is always on "choking hazards" and encouraged by speech therapy to have thickened liquids. Pt given potassium tablet with applesauce, upon administration of second tablet, pt began a mild coughing fit. Pt's caregiver states this happens occasionally when he attempts to take his pills and is normal for the pt. Pt states "I'm alright". Pt lung sounds clear, pt exhibits no difficulty breathing, O2 sats remain at 100% on RA. Per EDP, plan is for pt to be d/c'd home. If plan of care changes and pt ultimately admitted, will order ST evaluation.

## 2017-01-01 ENCOUNTER — Observation Stay (HOSPITAL_COMMUNITY): Payer: Medicare Other

## 2017-01-01 DIAGNOSIS — C3492 Malignant neoplasm of unspecified part of left bronchus or lung: Secondary | ICD-10-CM | POA: Diagnosis not present

## 2017-01-01 DIAGNOSIS — F0390 Unspecified dementia without behavioral disturbance: Secondary | ICD-10-CM

## 2017-01-01 DIAGNOSIS — E876 Hypokalemia: Secondary | ICD-10-CM

## 2017-01-01 DIAGNOSIS — G40909 Epilepsy, unspecified, not intractable, without status epilepticus: Secondary | ICD-10-CM | POA: Diagnosis not present

## 2017-01-01 DIAGNOSIS — R1310 Dysphagia, unspecified: Secondary | ICD-10-CM

## 2017-01-01 DIAGNOSIS — R569 Unspecified convulsions: Secondary | ICD-10-CM | POA: Insufficient documentation

## 2017-01-01 DIAGNOSIS — G40409 Other generalized epilepsy and epileptic syndromes, not intractable, without status epilepticus: Secondary | ICD-10-CM | POA: Diagnosis not present

## 2017-01-01 DIAGNOSIS — F72 Severe intellectual disabilities: Secondary | ICD-10-CM

## 2017-01-01 LAB — BASIC METABOLIC PANEL
Anion gap: 7 (ref 5–15)
BUN: 12 mg/dL (ref 6–20)
CHLORIDE: 108 mmol/L (ref 101–111)
CO2: 27 mmol/L (ref 22–32)
CREATININE: 0.92 mg/dL (ref 0.61–1.24)
Calcium: 8.3 mg/dL — ABNORMAL LOW (ref 8.9–10.3)
GFR calc non Af Amer: >60 mL/min (ref >60)
Glucose, Bld: 84 mg/dL (ref 65–99)
Potassium: 3.9 mmol/L (ref 3.5–5.1)
Sodium: 142 mmol/L (ref 135–145)

## 2017-01-01 LAB — CK
CK TOTAL: 127 U/L (ref 49–397)
Total CK: 71 U/L (ref 49–397)

## 2017-01-01 LAB — CBC WITH DIFFERENTIAL/PLATELET
Basophils Absolute: 0 10*3/uL (ref 0.0–0.1)
Basophils Relative: 0 %
EOS ABS: 0 10*3/uL (ref 0.0–0.7)
Eosinophils Relative: 0 %
HEMATOCRIT: 36.1 % — AB (ref 39.0–52.0)
HEMOGLOBIN: 11.4 g/dL — AB (ref 13.0–17.0)
LYMPHS ABS: 1.4 10*3/uL (ref 0.7–4.0)
LYMPHS PCT: 21 %
MCH: 27.8 pg (ref 26.0–34.0)
MCHC: 31.6 g/dL (ref 30.0–36.0)
MCV: 88 fL (ref 78.0–100.0)
MONOS PCT: 10 %
Monocytes Absolute: 0.7 10*3/uL (ref 0.1–1.0)
NEUTROS ABS: 4.8 10*3/uL (ref 1.7–7.7)
NEUTROS PCT: 69 %
PLATELETS: 162 10*3/uL (ref 150–400)
RBC: 4.1 MIL/uL — ABNORMAL LOW (ref 4.22–5.81)
RDW: 13 % (ref 11.5–15.5)
WBC: 6.9 10*3/uL (ref 4.0–10.5)

## 2017-01-01 MED ORDER — PROCHLORPERAZINE MALEATE 10 MG PO TABS
10.0000 mg | ORAL_TABLET | Freq: Four times a day (QID) | ORAL | Status: DC | PRN
  Filled 2017-01-01: qty 1

## 2017-01-01 MED ORDER — ACETAMINOPHEN 650 MG RE SUPP: 650.0000 mg | RECTAL | Status: DC | PRN

## 2017-01-01 MED ORDER — LEVETIRACETAM 750 MG PO TABS
1500.0000 mg | ORAL_TABLET | Freq: Two times a day (BID) | ORAL | Status: DC
  Administered 2017-01-01: 1500 mg via ORAL
  Filled 2017-01-01: qty 2

## 2017-01-01 MED ORDER — LORAZEPAM 2 MG/ML IJ SOLN: 1.0000 mg | INTRAMUSCULAR | Status: DC | PRN

## 2017-01-01 MED ORDER — BISACODYL 5 MG PO TBEC: 5.0000 mg | DELAYED_RELEASE_TABLET | Freq: Every day | ORAL | Status: DC | PRN

## 2017-01-01 MED ORDER — DIVALPROEX SODIUM ER 250 MG PO TB24: 750.0000 mg | ORAL_TABLET | Freq: Two times a day (BID) | ORAL | 0 refills | Status: DC

## 2017-01-01 MED ORDER — SODIUM CHLORIDE 0.9 % IV SOLN
200.0000 mg | Freq: Two times a day (BID) | INTRAVENOUS | Status: DC
  Administered 2017-01-01: 200 mg via INTRAVENOUS
  Filled 2017-01-01 (×2): qty 20

## 2017-01-01 MED ORDER — DONEPEZIL HCL 10 MG PO TABS: 10.0000 mg | ORAL_TABLET | Freq: Every day | ORAL | Status: DC

## 2017-01-01 MED ORDER — PANTOPRAZOLE SODIUM 40 MG IV SOLR
40.0000 mg | Freq: Two times a day (BID) | INTRAVENOUS | Status: DC
Start: 2017-01-01 — End: 2017-01-01
  Filled 2017-01-01: qty 40

## 2017-01-01 MED ORDER — TRAZODONE HCL 50 MG PO TABS: 25.0000 mg | ORAL_TABLET | Freq: Every evening | ORAL | Status: DC | PRN

## 2017-01-01 MED ORDER — ENOXAPARIN SODIUM 30 MG/0.3ML ~~LOC~~ SOLN
30.0000 mg | SUBCUTANEOUS | Status: DC
  Filled 2017-01-01: qty 0.3

## 2017-01-01 MED ORDER — PANTOPRAZOLE SODIUM 40 MG PO TBEC
40.0000 mg | DELAYED_RELEASE_TABLET | Freq: Two times a day (BID) | ORAL | Status: DC
  Administered 2017-01-01: 40 mg via ORAL
  Filled 2017-01-01: qty 1

## 2017-01-01 MED ORDER — SODIUM CHLORIDE 0.9 % IV SOLN
1500.0000 mg | Freq: Two times a day (BID) | INTRAVENOUS | Status: DC
  Filled 2017-01-01: qty 15

## 2017-01-01 MED ORDER — ONDANSETRON HCL 4 MG PO TABS: 4.0000 mg | ORAL_TABLET | Freq: Four times a day (QID) | ORAL | Status: DC | PRN

## 2017-01-01 MED ORDER — ACETAMINOPHEN 325 MG PO TABS: 650.0000 mg | ORAL_TABLET | ORAL | Status: DC | PRN

## 2017-01-01 MED ORDER — LACOSAMIDE 50 MG PO TABS
200.0000 mg | ORAL_TABLET | Freq: Two times a day (BID) | ORAL | Status: DC
  Administered 2017-01-01: 200 mg via ORAL
  Filled 2017-01-01: qty 4

## 2017-01-01 MED ORDER — SODIUM CHLORIDE 0.9 % IV SOLN: INTRAVENOUS | Status: DC

## 2017-01-01 MED ORDER — RISPERIDONE 0.5 MG PO TABS: 1.0000 mg | ORAL_TABLET | Freq: Every day | ORAL | Status: DC

## 2017-01-01 MED ORDER — DIVALPROEX SODIUM ER 500 MG PO TB24
750.0000 mg | ORAL_TABLET | Freq: Two times a day (BID) | ORAL | Status: DC
  Administered 2017-01-01: 750 mg via ORAL
  Filled 2017-01-01 (×2): qty 1

## 2017-01-01 MED ORDER — ONDANSETRON HCL 4 MG/2ML IJ SOLN: 4.0000 mg | Freq: Four times a day (QID) | INTRAMUSCULAR | Status: DC | PRN

## 2017-01-01 MED ORDER — SODIUM CHLORIDE 0.9 % IV SOLN: 75.0000 mL/h | INTRAVENOUS | Status: DC

## 2017-01-01 MED ORDER — ENOXAPARIN SODIUM 40 MG/0.4ML ~~LOC~~ SOLN
40.0000 mg | SUBCUTANEOUS | Status: DC
  Administered 2017-01-01: 40 mg via SUBCUTANEOUS
  Filled 2017-01-01: qty 0.4

## 2017-01-01 MED ORDER — SODIUM CHLORIDE 0.9 % IV SOLN
75.0000 mL/h | INTRAVENOUS | Status: DC
  Administered 2017-01-01: 75 mL/h via INTRAVENOUS

## 2017-01-01 MED ORDER — VALPROATE SODIUM 500 MG/5ML IV SOLN
500.0000 mg | Freq: Two times a day (BID) | INTRAVENOUS | Status: DC
  Filled 2017-01-01: qty 5

## 2017-01-01 MED ORDER — FLUTICASONE PROPIONATE 50 MCG/ACT NA SUSP
2.0000 | Freq: Every day | NASAL | Status: DC
  Administered 2017-01-01: 2 via NASAL
  Filled 2017-01-01: qty 16

## 2017-01-01 NOTE — Progress Notes (Signed)
Alert and oriented to self. Denies any pain. Answer yes/no questions. Incomprehensible speech. Seizure precaution in placed. Bed in lowest position. Call light within reach.

## 2017-01-01 NOTE — Care Management Note (Addendum)
Case Management Note  Patient Details  Name: MANCEL LARDIZABAL MRN: [id number] Date of Birth: 05-29-1951  Subjective/Objective:   Pt admitted with seizures. He is from a group home with caregivers. History of dementia and MR.  No DME currently.   Action/Plan: Awaiting PT/OT recommendations. CM following for d/c needs, physician orders.  Expected Discharge Date:                  Expected Discharge Plan:     In-House Referral:     Discharge planning Services     Post Acute Care Choice:    Choice offered to:     DME Arranged:    DME Agency:     HH Arranged:    HH Agency:     Status of Service:  In process, will continue to follow  If discussed at Long Length of Stay Meetings, dates discussed:    Additional Comments:  Pollie Friar, RN 01/01/2017, 10:22 AM

## 2017-01-01 NOTE — H&P (Signed)
History and Physical    BRITTANY AMIRAULT HER:[id number] DOB: 1951/07/12 DOA: 12/31/2016  Referring MD/NP/PA: Dr. Esaw Grandchild, Resident PCP: Leamon Arnt, MD  Patient coming from: Home via EMS  Chief Complaint: Seizure  HPI: Marco [name] is a 66 y.o. male with medical history significant of severe mental retardation, seizure disorder followed by Dr. Sarina Ill, [facility] on chemotherapy followed by Dr. Earlie Server;  Who presents with complaints of having one generalized tonic-clonic seizure prior to arrival. Seizure was reported to the last less than 2 minutes and self resolved without any intervention. Seizure was witnessed by the patient's caregiver for which history has been obtained due to patient's history of severe mental retardation and post ictal state. Patient's last known seizure prior to his presenting symptoms was in August 2017. Caregiver gives patient all of his medications and states that he takes them as prescribed. There was no note of any bladder/bowel incontinence or tongue biting. Following the seizure he was more lethargic. There was no note of any recent changes to his medications, fever, chills, nausea, vomiting, or recent sick contacts.  He is reported to have a chronic cough for which he is on Hycodan. Caregiver also noted has had issues with swallowing before in the past.  ED Course: Upon admission into the emergency department patient was seen have another seizure lasting 2 to 2.5 mins. 1 mg of Ativan was given IV around 7:30 PM with resolution of seizure activity. Labs revealed WBC 7.9, hemoglobin 12.8, potassium 3.3, Valproic acid level 25. Urinalysis was negative. Patient was given his AED meds of Keppra and Depakote through IV. During ED evaluation patient was given oral medications applesauce and they reported patient with coughing and possible aspiration . Admit for monitoring overnight as patient noted to be unsteady on feet following seizures.  Review of  Systems: Unable to obtain secondary to severe mental retardation and postictal state.  Past Medical History:  Diagnosis Date  . DEMENTIA   . Depression   . Developmental disability    developmentaly delayed  . Encounter for antineoplastic chemotherapy 10/10/2016  . Encounter for antineoplastic immunotherapy 04/05/2015  . Encounter for antineoplastic immunotherapy 12/21/2016  . GERD (gastroesophageal reflux disease)   . Hx of radiation therapy 11/07/13-12/24/13   lung,60Gy/36f  . Hypertension    no medications, no documented history per caregiver at preadmission  . [place] ([facility]   . Mental disorder    sczizophrenia;moderate retardation  . Mental retardation   . Seizure disorder ([facility]   . [place]     Past Surgical History:  Procedure Laterality Date  . CATARACT EXTRACTION W/PHACO  07/12/2011   Procedure: CATARACT EXTRACTION PHACO AND INTRAOCULAR LENS PLACEMENT (IOC);  Surgeon: GAdonis Brook  [facility];  Service: Ophthalmology;  Laterality: Left;  . EYE SURGERY     L eye  . VIDEO BRONCHOSCOPY Bilateral 10/01/2013   Procedure: VIDEO BRONCHOSCOPY WITHOUT FLUORO;  Surgeon: RCollene Gobble MD;  [facility]  Service: Cardiopulmonary;  Laterality: Bilateral;     reports that he quit smoking about 3 years ago. His smoking use included Cigarettes. He has a 17.50 pack-year smoking history. He has never used smokeless tobacco. He reports that he does not drink alcohol or use drugs.  No Known Allergies  Family History  Problem Relation Age of Onset  . Hypertension Mother   . Hypertension Father     Prior to Admission medications   Medication Sig Start Date End Date Taking? Authorizing  Provider  acetaminophen (TYLENOL) 500 MG tablet Take 500 mg by mouth every 4 (four) hours as needed for mild pain or headache.    Yes Historical Provider, MD  bisacodyl (DULCOLAX) 5 MG EC tablet Take 5 mg by mouth daily as needed for mild constipation.    Yes Historical Provider, MD    diazepam (DIASTAT ACUDIAL) 10 MG GEL Place 5 mg rectally once as needed for seizure.   Yes Historical Provider, MD  divalproex (DEPAKOTE ER) 500 MG 24 hr tablet Take 1 tablet (500 mg total) by mouth 2 (two) times daily. 02/07/16  Yes Melvenia Beam, MD  donepezil (ARICEPT) 10 MG tablet Take 10 mg by mouth at bedtime.    Yes Historical Provider, MD  fluticasone (FLONASE) 50 MCG/ACT nasal spray Place 2 sprays into both nostrils daily.   Yes Historical Provider, MD  HYDROcodone-homatropine (HYCODAN) 5-1.5 MG/5ML syrup Take 5 mLs by mouth every 6 (six) hours as needed for cough. 12/28/16  Yes Ladell Pier, MD  ketoconazole (NIZORAL) 2 % cream Apply 1 application topically 2 (two) times daily as needed for irritation. Pt applies to buttocks.    Yes Historical Provider, MD  lacosamide (VIMPAT) 200 MG TABS tablet Take 1 tablet (200 mg total) by mouth 2 (two) times daily. 11/06/16  Yes Melvenia Beam, MD  levETIRAcetam (KEPPRA XR) 500 MG 24 hr tablet Take 3 tablets (1,500 mg total) by mouth 2 (two) times daily. 02/07/16  Yes Melvenia Beam, MD  loperamide (IMODIUM) 2 MG capsule Take 2 mg by mouth every 4 (four) hours as needed for diarrhea or loose stools.   Yes Historical Provider, MD  LORazepam (ATIVAN) 1 MG tablet Take 2 tablets (2 mg total) by mouth at bedtime. 04/24/16  Yes Clanford Marisa Hua, MD  omeprazole (PRILOSEC) 20 MG capsule Take 20 mg by mouth 2 (two) times daily.   Yes Historical Provider, MD  polyethylene glycol (MIRALAX / GLYCOLAX) packet Take 17 g by mouth daily as needed for mild constipation or moderate constipation.   Yes Historical Provider, MD  prochlorperazine (COMPAZINE) 10 MG tablet Take 10 mg by mouth every 6 (six) hours as needed for nausea or vomiting.   Yes Historical Provider, MD  risperiDONE (RISPERDAL) 1 MG tablet Take 1 tablet (1 mg total) by mouth at bedtime. 08/25/15  Yes Theodis Blaze, MD  traZODone (DESYREL) 50 MG tablet Take 25 mg by mouth at bedtime as needed for sleep.    Yes Historical Provider, MD    Physical Exam:  Constitutional: Thin elderly male who is lethargic, but arousable 2. Verbal stimuli.  Vitals:   01/01/17 0000 01/01/17 0030 01/01/17 0100 01/01/17 0146  BP: '99/71 99/75 95/72 '$ (P) 109/74  Pulse: 85 83 88 (P) 95  Resp:    (P) 18  Temp:    (P) 97.9 F (36.6 C)  TempSrc:    (P) Oral  SpO2: 100% 100% 100% (P) 98%  Weight:    (P) 56.7 kg (125 lb)  Height:       Eyes: PERRL, lids and conjunctivae normal ENMT: Mucous membranes are dry. Posterior pharynx clear of any exudate or lesions. Edentulous  Neck: normal, supple, no masses, no thyromegaly Respiratory: clear to auscultation bilaterally, no wheezing, no crackles. Normal respiratory effort. No accessory muscle use.  Cardiovascular: Regular rate and rhythm, no murmurs / rubs / gallops. No extremity edema. 2+ pedal pulses. No carotid bruits.  Abdomen: no tenderness, no masses palpated. No hepatosplenomegaly. Bowel sounds positive.  Musculoskeletal: no clubbing / cyanosis. No joint deformity upper and lower extremities. Good ROM, no contractures. Normal muscle tone.  Skin: no rashes, lesions, ulcers. No induration Neurologic: CN 2-12 grossly intact. Patient able to move all extremities.  Psychiatric: Significant mental retardation, but currently lethargic and unable to fully assess at this time.    Labs on Admission: I have personally reviewed following labs and imaging studies  CBC:  Recent Labs Lab 12/31/16 1953  WBC 7.9  NEUTROABS 5.5  HGB 12.8*  HCT 40.5  MCV 89.2  PLT 010   Basic Metabolic Panel:  Recent Labs Lab 12/31/16 1953  NA 135  K 3.3*  CL 99*  CO2 21*  GLUCOSE 164*  BUN 14  CREATININE 1.14  CALCIUM 8.4*   GFR: Estimated Creatinine Clearance: 49.7 mL/min (by C-G formula based on SCr of 1.14 mg/dL). Liver Function Tests:  Recent Labs Lab 12/31/16 1953  AST 26  ALT 13*  ALKPHOS 62  BILITOT 0.8  PROT 7.4  ALBUMIN 3.4*   No results for input(s):  LIPASE, AMYLASE in the last 168 hours. No results for input(s): AMMONIA in the last 168 hours. Coagulation Profile: No results for input(s): INR, PROTIME in the last 168 hours. Cardiac Enzymes: No results for input(s): CKTOTAL, CKMB, CKMBINDEX, TROPONINI in the last 168 hours. BNP (last 3 results) No results for input(s): PROBNP in the last 8760 hours. HbA1C: No results for input(s): HGBA1C in the last 72 hours. CBG:  Recent Labs Lab 12/31/16 2015  GLUCAP 132*   Lipid Profile: No results for input(s): CHOL, HDL, LDLCALC, TRIG, CHOLHDL, LDLDIRECT in the last 72 hours. Thyroid Function Tests: No results for input(s): TSH, T4TOTAL, FREET4, T3FREE, THYROIDAB in the last 72 hours. Anemia Panel: No results for input(s): VITAMINB12, FOLATE, FERRITIN, TIBC, IRON, RETICCTPCT in the last 72 hours. Urine analysis:    Component Value Date/Time   COLORURINE YELLOW 12/31/2016 2153   APPEARANCEUR CLEAR 12/31/2016 2153   LABSPEC 1.019 12/31/2016 2153   LABSPEC 1.020 03/16/2015 1520   PHURINE 5.0 12/31/2016 2153   GLUCOSEU NEGATIVE 12/31/2016 2153   GLUCOSEU Negative 03/16/2015 1520   HGBUR SMALL (A) 12/31/2016 2153   BILIRUBINUR NEGATIVE 12/31/2016 2153   BILIRUBINUR Negative 03/16/2015 [place] 12/31/2016 2153   PROTEINUR NEGATIVE 12/31/2016 2153   UROBILINOGEN 0.2 06/27/2015 1543   UROBILINOGEN 0.2 03/16/2015 1520   NITRITE NEGATIVE 12/31/2016 2153   LEUKOCYTESUR NEGATIVE 12/31/2016 2153   LEUKOCYTESUR Negative 03/16/2015 1520   Sepsis Labs: No results found for this or any previous visit (from the past 240 hour(s)).   Radiological Exams on Admission: Ct Head Wo Contrast  Result Date: 12/31/2016 CLINICAL DATA:  Postictal.  History of lung cancer. EXAM: CT HEAD WITHOUT CONTRAST TECHNIQUE: Contiguous axial images were obtained from the base of the skull through the vertex without intravenous contrast. COMPARISON:  04/23/2016 FINDINGS: Brain: No evidence of acute  infarction, hemorrhage, hydrocephalus, extra-axial collection or mass lesion/mass effect. Mild brain parenchymal volume loss and periventricular microangiopathy. Vascular: No hyperdense vessel. Skull: Normal. Negative for fracture or focal lesion. Sinuses/Orbits: No acute finding. Other: None. IMPRESSION: No acute intracranial abnormality. Mild brain parenchymal atrophy and chronic microvascular disease. Electronically Signed   By: Fidela Salisbury M.D.   On: 12/31/2016 21:46   Dg Chest Port 1 View  Result Date: 01/01/2017 CLINICAL DATA:  Recurrent seizures. History of recurrent lung cancer. EXAM: PORTABLE CHEST 1 VIEW COMPARISON:  CT chest September 07, 2016 and PET- CT September 29, 2016  FINDINGS: Heart size is normal. LEFT perihilar mass as seen on prior CT. No pleural effusion. No pneumothorax. Soft tissue planes included osseous structures are nonsuspicious. IMPRESSION: Re- demonstration of LEFT perihilar mass without acute cardiopulmonary process. Electronically Signed   By: Elon Alas M.D.   On: 01/01/2017 01:18    EKG: Independently reviewed. Sinus tachycardia at 111 bpm   Assessment/Plan Recurrent seizures with H/O Seizure disorder: Acute. Patient noted to have 2 seizures the last 24 hours. - Admit to a telemetry bed  - Seizure order set initiated - Seizure precautions and close monitoring for seizure-like activity - NPO, until able to pass a bedside swallow - May warrant notifying Dr. Jaynee Eagles of neurology in a.m for further recommendations - May warrant MRI brain if repeat seizures occur given history of malignancy - Continue AED medications IV Keppra, Vimpat, Depakote until able to pass swallow screen  Dysphagia: Patient noted to cough after being given potassium chloride pills and ED and patient noted to previously have issues with swallowing in the past. - Check chest x-ray - Check formal swallowing eval in a.m. - Will need to restart oral medications, if able to pass swallow  screen  Hypokalemia initial potassium noted to be 3.3 on admission. Patient given 40 mEq orally in the ED. - Continue to monitor and replace as needed  Renal insufficiency: Acute. Baseline creatinine previously noted between 0.8-1. Patient presents with elevated creatinine of 1.14 on admission. - Added on a CPK - Gentle IV fluids overnight - Recheck BMP in a.m.  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  S/p concurrent chemoradiation currently on Nivolumab 240 MG IV every 2 weeks(last infusion was 4/19). - Continue outpatient f/u with oncology  Severe Mental retardation And h/o dementia - Set bed alarm on - Sitter to bedside  GERD - Pharmacy substitution of Protonix IV while NPO    DVT prophylaxis: lovenox Code Status: Full  Family Communication:  family present at bedside  Disposition Plan: Likely discharge home in a.m.  Consults called: none Admission status:Observation   Norval Morton MD [facility] Pager [phone]  If 7PM-7AM, please contact night-coverage [email or web address] Password [email or web address]  01/01/2017, 2:16 AM

## 2017-01-01 NOTE — H&P (Signed)
History and Physical    Marco [name] ZOX:[id number] DOB: July 15, 1951 DOA: 12/31/2016  Referring MD/NP/PA: Dr. Esaw Grandchild, Resident PCP: Leamon Arnt, MD  Patient coming from: Home via EMS  Chief Complaint: Seizure  HPI: Marco [name] is a 66 y.o. male with medical history significant of severe mental retardation, seizure disorder followed by Dr. Sarina Ill, [facility] on chemotherapy followed by Dr. Earlie Server;  Who presents with complaints of having one generalized tonic-clonic seizure prior to arrival. Seizure was reported to the last less than 2 minutes and self resolved without any intervention. Seizure was witnessed by the patient's caregiver for which history has been obtained due to patient's history of severe mental retardation and post ictal state. Patient's last known seizure prior to his presenting symptoms was in August 2017. Caregiver gives patient all of his medications and states that he takes them as prescribed. There was no note of any bladder/bowel incontinence or tongue biting. Following the seizure he was more lethargic. There was no note of any recent changes to his medications, fever, chills, nausea, vomiting, or recent sick contacts.  He is reported to have a chronic cough for which he is on Hycodan. Caregiver also noted has had issues with swallowing before in the past.  ED Course: Upon admission into the emergency department patient was seen have another seizure lasting 2 to 2.5 mins. 1 mg of Ativan was given IV around 7:30 PM with resolution of seizure activity. Labs revealed WBC 7.9, hemoglobin 12.8, potassium 3.3, Valproic acid level 25. Urinalysis was negative. Patient was given his AED meds of Keppra and Depakote through IV. During ED evaluation patient was given oral medications applesauce and they reported patient with coughing and possible aspiration . Admit for monitoring overnight as patient noted to be unsteady on feet following seizures.  Review of  Systems: Unable to obtain secondary to severe mental retardation   Past Medical History:  Diagnosis Date  . DEMENTIA   . Depression   . Developmental disability    developmentaly delayed  . Encounter for antineoplastic chemotherapy 10/10/2016  . Encounter for antineoplastic immunotherapy 04/05/2015  . Encounter for antineoplastic immunotherapy 12/21/2016  . GERD (gastroesophageal reflux disease)   . Hx of radiation therapy 11/07/13-12/24/13   lung,60Gy/65f  . Hypertension    no medications, no documented history per caregiver at preadmission  . [place] cancer [place]   . Mental disorder    sczizophrenia;moderate retardation  . Mental retardation   . [place]   . [place]ures ([place]     Past Surgical History:  Procedure Laterality Date  . CATARACT EXTRACTION W/PHACO  07/12/2011   Procedure: CATARACT EXTRACTION PHACO AND INTRAOCULAR LENS PLACEMENT (IOC);  Surgeon: GAdonis Brook  [facility];  Service: Ophthalmology;  Laterality: Left;  . EYE SURGERY     L eye  . VIDEO BRONCHOSCOPY Bilateral 10/01/2013   Procedure: VIDEO BRONCHOSCOPY WITHOUT FLUORO;  Surgeon: RCollene Gobble MD;  [facility]  Service: Cardiopulmonary;  Laterality: Bilateral;     reports that he quit smoking about 3 years ago. His smoking use included Cigarettes. He has a 17.50 pack-year smoking history. He has never used smokeless tobacco. He reports that he does not drink alcohol or use drugs.  No Known Allergies  Family History  Problem Relation Age of Onset  . Hypertension Mother   . Hypertension Father     Prior to Admission medications   Medication Sig Start Date End Date Taking? Authorizing Provider  acetaminophen (TYLENOL) 500 MG tablet Take 500 mg by mouth every 4 (four) hours as needed for mild pain or headache.    Yes Historical Provider, MD  bisacodyl (DULCOLAX) 5 MG EC tablet Take 5 mg by mouth daily as needed for mild constipation.    Yes Historical Provider, MD  diazepam (DIASTAT  ACUDIAL) 10 MG GEL Place 5 mg rectally once as needed for seizure.   Yes Historical Provider, MD  divalproex (DEPAKOTE ER) 500 MG 24 hr tablet Take 1 tablet (500 mg total) by mouth 2 (two) times daily. 02/07/16  Yes Melvenia Beam, MD  donepezil (ARICEPT) 10 MG tablet Take 10 mg by mouth at bedtime.    Yes Historical Provider, MD  fluticasone (FLONASE) 50 MCG/ACT nasal spray Place 2 sprays into both nostrils daily.   Yes Historical Provider, MD  HYDROcodone-homatropine (HYCODAN) 5-1.5 MG/5ML syrup Take 5 mLs by mouth every 6 (six) hours as needed for cough. 12/28/16  Yes Ladell Pier, MD  ketoconazole (NIZORAL) 2 % cream Apply 1 application topically 2 (two) times daily as needed for irritation. Pt applies to buttocks.    Yes Historical Provider, MD  lacosamide (VIMPAT) 200 MG TABS tablet Take 1 tablet (200 mg total) by mouth 2 (two) times daily. 11/06/16  Yes Melvenia Beam, MD  levETIRAcetam (KEPPRA XR) 500 MG 24 hr tablet Take 3 tablets (1,500 mg total) by mouth 2 (two) times daily. 02/07/16  Yes Melvenia Beam, MD  loperamide (IMODIUM) 2 MG capsule Take 2 mg by mouth every 4 (four) hours as needed for diarrhea or loose stools.   Yes Historical Provider, MD  LORazepam (ATIVAN) 1 MG tablet Take 2 tablets (2 mg total) by mouth at bedtime. 04/24/16  Yes Clanford Marisa Hua, MD  omeprazole (PRILOSEC) 20 MG capsule Take 20 mg by mouth 2 (two) times daily.   Yes Historical Provider, MD  polyethylene glycol (MIRALAX / GLYCOLAX) packet Take 17 g by mouth daily as needed for mild constipation or moderate constipation.   Yes Historical Provider, MD  prochlorperazine (COMPAZINE) 10 MG tablet Take 10 mg by mouth every 6 (six) hours as needed for nausea or vomiting.   Yes Historical Provider, MD  risperiDONE (RISPERDAL) 1 MG tablet Take 1 tablet (1 mg total) by mouth at bedtime. 08/25/15  Yes Theodis Blaze, MD  traZODone (DESYREL) 50 MG tablet Take 25 mg by mouth at bedtime as needed for sleep.   Yes Historical  Provider, MD    Physical Exam:  Constitutional: Thin elderly male who is lethargic, but arousable 2. Verbal stimuli.  Vitals:   12/31/16 2130 12/31/16 2200 12/31/16 2230 12/31/16 2330  BP: 123/74 104/72 103/82 107/75  Pulse: 96 88 92 81  Resp: (!) 30 (!) 28 16   Temp:      TempSrc:      SpO2: 97% 100% 100% 100%  Weight:      Height:       Eyes: PERRL, lids and conjunctivae normal ENMT: Mucous membranes are dry. Posterior pharynx clear of any exudate or lesions. Edentulous  Neck: normal, supple, no masses, no thyromegaly Respiratory: clear to auscultation bilaterally, no wheezing, no crackles. Normal respiratory effort. No accessory muscle use.  Cardiovascular: Regular rate and rhythm, no murmurs / rubs / gallops. No extremity edema. 2+ pedal pulses. No carotid bruits.  Abdomen: no tenderness, no masses palpated. No hepatosplenomegaly. Bowel sounds positive.  Musculoskeletal: no clubbing / cyanosis. No joint deformity upper and lower extremities. Good ROM,  no contractures. Normal muscle tone.  Skin: no rashes, lesions, ulcers. No induration Neurologic: CN 2-12 grossly intact. Patient able to move all extremities.  Psychiatric: Significant mental retardation, but currently lethargic and unable to fully assess at this time.    Labs on Admission: I have personally reviewed following labs and imaging studies  CBC:  Recent Labs Lab 12/31/16 1953  WBC 7.9  NEUTROABS 5.5  HGB 12.8*  HCT 40.5  MCV 89.2  PLT 332   Basic Metabolic Panel:  Recent Labs Lab 12/31/16 1953  NA 135  K 3.3*  CL 99*  CO2 21*  GLUCOSE 164*  BUN 14  CREATININE 1.14  CALCIUM 8.4*   GFR: Estimated Creatinine Clearance: 49.7 mL/min (by C-G formula based on SCr of 1.14 mg/dL). Liver Function Tests:  Recent Labs Lab 12/31/16 1953  AST 26  ALT 13*  ALKPHOS 62  BILITOT 0.8  PROT 7.4  ALBUMIN 3.4*   No results for input(s): LIPASE, AMYLASE in the last 168 hours. No results for input(s):  AMMONIA in the last 168 hours. Coagulation Profile: No results for input(s): INR, PROTIME in the last 168 hours. Cardiac Enzymes: No results for input(s): CKTOTAL, CKMB, CKMBINDEX, TROPONINI in the last 168 hours. BNP (last 3 results) No results for input(s): PROBNP in the last 8760 hours. HbA1C: No results for input(s): HGBA1C in the last 72 hours. CBG:  Recent Labs Lab 12/31/16 2015  GLUCAP 132*   Lipid Profile: No results for input(s): CHOL, HDL, LDLCALC, TRIG, CHOLHDL, LDLDIRECT in the last 72 hours. Thyroid Function Tests: No results for input(s): TSH, T4TOTAL, FREET4, T3FREE, THYROIDAB in the last 72 hours. Anemia Panel: No results for input(s): VITAMINB12, FOLATE, FERRITIN, TIBC, IRON, RETICCTPCT in the last 72 hours. Urine analysis:    Component Value Date/Time   COLORURINE YELLOW 12/31/2016 2153   APPEARANCEUR CLEAR 12/31/2016 2153   LABSPEC 1.019 12/31/2016 2153   LABSPEC 1.020 03/16/2015 1520   PHURINE 5.0 12/31/2016 2153   GLUCOSEU NEGATIVE 12/31/2016 2153   GLUCOSEU Negative 03/16/2015 1520   HGBUR SMALL (A) 12/31/2016 2153   BILIRUBINUR NEGATIVE 12/31/2016 2153   BILIRUBINUR Negative 03/16/2015 [place] 12/31/2016 2153   PROTEINUR NEGATIVE 12/31/2016 2153   UROBILINOGEN 0.2 06/27/2015 1543   UROBILINOGEN 0.2 03/16/2015 1520   NITRITE NEGATIVE 12/31/2016 2153   LEUKOCYTESUR NEGATIVE 12/31/2016 2153   LEUKOCYTESUR Negative 03/16/2015 1520   Sepsis Labs: No results found for this or any previous visit (from the past 240 hour(s)).   Radiological Exams on Admission: Ct Head Wo Contrast  Result Date: 12/31/2016 CLINICAL DATA:  Postictal.  History of lung cancer. EXAM: CT HEAD WITHOUT CONTRAST TECHNIQUE: Contiguous axial images were obtained from the base of the skull through the vertex without intravenous contrast. COMPARISON:  04/23/2016 FINDINGS: Brain: No evidence of acute infarction, hemorrhage, hydrocephalus, extra-axial collection or  mass lesion/mass effect. Mild brain parenchymal volume loss and periventricular microangiopathy. Vascular: No hyperdense vessel. Skull: Normal. Negative for fracture or focal lesion. Sinuses/Orbits: No acute finding. Other: None. IMPRESSION: No acute intracranial abnormality. Mild brain parenchymal atrophy and chronic microvascular disease. Electronically Signed   By: Fidela Salisbury M.D.   On: 12/31/2016 21:46    EKG: Independently reviewed. Sinus tachycardia at 111 bpm   Assessment/Plan Recurrent seizures with H/O Seizure disorder: Acute. Patient noted to have 2 seizures the last 24 hours. - Admit to a telemetry bed  - Seizure order set initiated - Seizure precautions and close monitoring for seizure-like activity -  NPO, until able to pass a bedside swallow - May warrant notifying Dr. Jaynee Eagles of neurology in a.m for further recommendations - May warrant MRI brain if repeat seizures occur given history of malignancy - Continue AED medications IV Keppra, Vimpat, Depakote until able to pass swallow screen  Dysphagia: Patient noted to cough after being given potassium chloride pills and ED and patient noted to previously have issues with swallowing in the past. - Check chest x-ray - Check formal swallowing eval in a.m. - Will need to restart oral medications, if able to pass swallow screen  Hypokalemia initial potassium noted to be 3.3 on admission. Patient given 40 mEq orally in the ED. - Continue to monitor and replace as needed  Renal insufficiency: Acute. Baseline creatinine previously noted between 0.8-1. Patient presents with elevated creatinine of 1.14 on admission. - Added on a CPK - Gentle IV fluids overnight - Recheck BMP in a.m.  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  S/p concurrent chemoradiation currently on Nivolumab 240 MG IV every 2 weeks(last infusion was 4/19). - Continue outpatient f/u with oncology  Severe Mental retardation  And h/o dementia - Set bed alarm on - Sitter to bedside  GERD - Pharmacy substitution of Protonix IV while NPO    DVT prophylaxis: lovenox Code Status: Full  Family Communication:  family present at bedside  Disposition Plan: Likely discharge home in a.m.  Consults called: none Admission status:Observation   Norval Morton MD [facility] Pager [phone]  If 7PM-7AM, please contact night-coverage [email or web address] Password [email or web address]  01/01/2017, 12:18 AM

## 2017-01-01 NOTE — Progress Notes (Signed)
Pharmacy Consult:  Anti-seizure medication monitoring   66 yom on Keppra, Vimpat, and Valoproate inpatient - converted to PO this AM per Pharmacy consult (as PTA except MD increased Depakote dose).   Drug interactions:  Concurrent use of risperidone and valproic acid may result in increased valproic acid concentrations (moderate)  Also noted:  Concurrent use of Aricept and risperidone may result in increased QT prolongation and risk of Torsades (major)   Plan: No changes necessary for now Pharmacy will monitor   Elicia Lamp, PharmD, BCPS Clinical Pharmacist 01/01/2017 9:52 AM

## 2017-01-01 NOTE — Progress Notes (Signed)
   01/01/17 0800  SLP G-Codes **NOT FOR INPATIENT CLASS**  Functional Assessment Tool Used (clinical judgement)  Functional Limitations Swallowing  Swallow Current Status (H5391) CI  Swallow Goal Status (S2583) CI  Swallow Discharge Status (M6219) CI  SLP Evaluations  $ SLP Speech Visit 1 Procedure  SLP Evaluations  $BSS Swallow 1 Procedure

## 2017-01-01 NOTE — ED Notes (Addendum)
Dr. Tamala Julian at the bedside. Dr. Tamala Julian aware of pt history of multiple speech evaluations. This nurse updated Dr. Tamala Julian to pt mild coughing fit upon swallowing his oral meds.

## 2017-01-01 NOTE — Progress Notes (Signed)
Patient is discharged from room 5C08 at this time. Alert and in stable condition. IV site d/c'd as well as tele. Instructions read to patient and care giver with understanding verbalized. Left unit via wheelchair with all belongings at side.

## 2017-01-01 NOTE — Evaluation (Signed)
Physical Therapy Evaluation Patient Details Name: Marco [name] MRN: [id number] DOB: [birth year] Today's Date: 01/01/2017   History of Present Illness  Patient is a 66 y.o. male with medical history significant of severe mental retardation, seizure history who presents to [facility] with witnessed seizure.  Clinical Impression  Patient seen for mobility assessment. Patient with some modest instability noted during ambulation with some LOB but able to self correct without physical assist. At this time, feel patient will be safe for d/c home. Encourage continued mobility with supervision during hospital course. Will sign off.     Follow Up Recommendations No PT follow up    Equipment Recommendations  None recommended by PT    Recommendations for Other Services       Precautions / Restrictions Precautions Precautions: Fall Restrictions Weight Bearing Restrictions: No      Mobility  Bed Mobility Overal bed mobility: Independent                Transfers Overall transfer level: Independent Equipment used: None                Ambulation/Gait Ambulation/Gait assistance: Supervision Ambulation Distance (Feet): 210 Feet Assistive device: None Gait Pattern/deviations: Drifts right/left;Narrow base of support Gait velocity: decreased   General Gait Details: some instability noted during ambulation, multiple balance checks but no physical assist required, patient able to self correct.   Stairs            Wheelchair Mobility    Modified Rankin (Stroke Patients Only)       Balance Overall balance assessment: Needs assistance                           High level balance activites: Direction changes;Turns;Head turns High Level Balance Comments: some instability min guard for safety             Pertinent Vitals/Pain Pain Assessment: No/denies pain    Home Living Family/patient expects to be discharged to:: Other (Comment) (group home)                      Prior Function Level of Independence: Needs assistance   Gait / Transfers Assistance Needed: was able to walk, negotiate stairs and get in and out of bathtub previously on his own     Comments: spoke with Marco [name], caregiver from group home who reports can have a staff member shadow Marco [name], Marco [name] as he has had some instability before when coming home after having seizures     Hand Dominance   Dominant Hand: Right    Extremity/Trunk Assessment   Upper Extremity Assessment Upper Extremity Assessment: Overall WFL for tasks assessed    Lower Extremity Assessment Lower Extremity Assessment: Overall WFL for tasks assessed       Communication   Communication: Expressive difficulties  Cognition Arousal/Alertness: Awake/alert Behavior During Therapy: WFL for tasks assessed/performed Overall Cognitive Status: History of cognitive impairments - at baseline                                        General Comments      Exercises     Assessment/Plan    PT Assessment    PT Problem List         PT Treatment Interventions      PT Goals (Current goals can be found in  the Care Plan section)  Acute Rehab PT Goals PT Goal Formulation: All assessment and education complete, DC therapy    Frequency     Barriers to discharge        Co-evaluation               AM-PAC PT "6 Clicks" Daily Activity  Outcome Measure Difficulty turning over in bed (including adjusting bedclothes, sheets and blankets)?: A Little Difficulty moving from lying on back to sitting on the side of the bed? : A Little Difficulty sitting down on and standing up from a chair with arms (e.g., wheelchair, bedside commode, etc,.)?: A Little Help needed moving to and from a bed to chair (including a wheelchair)?: A Little Help needed walking in hospital room?: A Little Help needed climbing 3-5 steps with a railing? : A Little 6 Click Score: 18    End of Session               Time: 1638-4536 PT Time Calculation (min) (ACUTE ONLY): 18 min   Charges:   PT Evaluation $PT Eval Low Complexity: 1 Procedure     PT G Codes:   PT G-Codes **NOT FOR INPATIENT CLASS** Functional Assessment Tool Used: Clinical judgement Functional Limitation: Mobility: Walking and moving around Mobility: Walking and Moving Around Current Status (I6803): At least 1 percent but less than 20 percent impaired, limited or restricted Mobility: Walking and Moving Around Goal Status [phone]): At least 1 percent but less than 20 percent impaired, limited or restricted Mobility: Walking and Moving Around Discharge Status [phone]): At least 1 percent but less than 20 percent impaired, limited or restricted    Marco [name], PT DPT  [place] 01/01/2017, 1:19 PM

## 2017-01-01 NOTE — Care Management Obs Status (Signed)
[place] NOTIFICATION   Patient Details  Name: DEVON PRETTY MRN: [id number] Date of Birth: [birth year]   Medicare Observation Status Notification Given:  Yes    Pollie Friar, RN 01/01/2017, 12:53 PM

## 2017-01-01 NOTE — Care Management Note (Signed)
Case Management Note  Patient Details  Name: Marco [name] MRN: [id number] Date of Birth: 1950/12/09  Subjective/Objective:                    Action/Plan: Pt discharging back to his group home today. No f/u and no DME per PT. Caregiver in the room to provide transportation home.   Expected Discharge Date:  01/01/17               Expected Discharge Plan:  Group Home  In-House Referral:     Discharge planning Services     Post Acute Care Choice:    Choice offered to:     DME Arranged:    DME Agency:     HH Arranged:    HH Agency:     Status of Service:  Completed, signed off  If discussed at [employer] of Stay Meetings, dates discussed:    Additional Comments:  Pollie Friar, RN 01/01/2017, 1:50 PM

## 2017-01-01 NOTE — ED Notes (Signed)
Attempted report x1. 

## 2017-01-01 NOTE — Discharge Summary (Addendum)
Physician Discharge Summary  CARMICHAEL BURDETTE XFG:[id number] DOB: [birth year] DOA: 12/31/2016  PCP: Leamon Arnt, MD  Admit date: 12/31/2016 Discharge date: 01/01/2017   Recommendations for Outpatient Follow-Up:   1. Valproic acid level (increased in 1-2 weeks) 2. 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  Diet recommendation: DYS  Wound care: None.   History of Present Illness:   Marco [name] is a 66 y.o. male with medical history significant of severe mental retardation, seizure disorder followed by Dr. Sarina Ill, [facility] on chemotherapy followed by Dr. Earlie Server;  Who presents with complaints of having one generalized tonic-clonic seizure prior to arrival. Seizure was reported to the last less than 2 minutes and self resolved without any intervention. Seizure was witnessed by the patient's caregiver for which history has been obtained due to patient's history of severe mental retardation and post ictal state. Patient's last known seizure prior to his presenting symptoms was in August 2017. Caregiver gives patient all of his medications and states that he takes them as prescribed. There was no note of any bladder/bowel incontinence or tongue biting. Following the seizure he was more lethargic. There was no note of any recent changes to his medications, fever, chills, nausea, vomiting, or recent sick contacts.  He is reported to have a chronic cough for which he is on Hycodan. Caregiver also noted has had issues with swallowing before in the past.   Hospital Course by Problem:   Recurrent  seizures with H/O Seizure disorder: Acute. Patient noted to have 2 seizures the last 24 hours. - continue meds with increase in depakote to '750mg'$  BID -close outpatient follow up of levels/seizure activity  Dysphagia:  -spoke with SLP-- at baseline DYS diet- thin liquids  Hypokalemia initial potassium noted to be 3.3 on admission.-replaced  Renal insufficiency: Acute. Baseline creatinine previously noted between 0.8-1. Patient presents with elevated creatinine of 1.14 on admission. - resolved  Squamous cell carcinoma of the left lung: Stage IIa diagnosis in January 2015.  S/p concurrent chemoradiation currently on Nivolumab 240 MG IV every 2 weeks(last infusion was 4/19). - Continue outpatient f/u with oncology  Severe Mental retardation And h/o dementia - at baseline per sitter  GERD - PPI    Medical Consultants:    Neuro (phone)   Discharge Exam:   Vitals:   01/01/17 0621 01/01/17 1021  BP: 121/79 101/81  Pulse: 97 94  Resp: 17 17  Temp: 98.2 F (36.8 C) 98.2 F (36.8 C)   Vitals:   01/01/17 0100 01/01/17 0146 01/01/17 0621 01/01/17 1021  BP: 95/72 109/74 121/79 101/81  Pulse: 88 95 97 94  Resp:  '18 17 17  '$ Temp:  97.9 F (36.6 C) 98.2 F (36.8 C) 98.2 F (36.8 C)  TempSrc:  Oral Oral Oral  SpO2: 100% 98% 99% 99%  Weight:  56.7 kg (125 lb)    Height:        Gen:  NAD    The results of significant diagnostics from this hospitalization (including imaging, microbiology, ancillary and laboratory) are listed below for reference.     Procedures  and Diagnostic Studies:   Ct Head Wo Contrast  Result Date: 12/31/2016 CLINICAL DATA:  Postictal.  History of lung cancer. EXAM: CT HEAD WITHOUT CONTRAST TECHNIQUE: Contiguous axial images were obtained from the base of the skull through the vertex without intravenous contrast. COMPARISON:  04/23/2016 FINDINGS: Brain: No evidence of acute infarction, hemorrhage, hydrocephalus, extra-axial collection or mass  lesion/mass effect. Mild brain parenchymal volume loss and periventricular microangiopathy. Vascular: No hyperdense vessel. Skull: Normal. Negative for fracture or focal lesion. Sinuses/Orbits: No acute finding. Other: None. IMPRESSION: No acute intracranial abnormality. Mild brain parenchymal atrophy and chronic microvascular disease. Electronically Signed   By: Fidela Salisbury M.D.   On: 12/31/2016 21:46   Dg Chest Port 1 View  Result Date: 01/01/2017 CLINICAL DATA:  Recurrent seizures. History of recurrent lung cancer. EXAM: PORTABLE CHEST 1 VIEW COMPARISON:  CT chest September 07, 2016 and PET- CT September 29, 2016 FINDINGS: Heart size is normal. LEFT perihilar mass as seen on prior CT. No pleural effusion. No pneumothorax. Soft tissue planes included osseous structures are nonsuspicious. IMPRESSION: Re- demonstration of LEFT perihilar mass without acute cardiopulmonary process. Electronically Signed   By: Elon Alas M.D.   On: 01/01/2017 01:18     Labs:   Basic Metabolic Panel:  Recent Labs Lab 12/31/16 1953 01/01/17 0426  NA 135 142  K 3.3* 3.9  CL 99* 108  CO2 21* 27  GLUCOSE 164* 84  BUN 14 12  CREATININE 1.14 0.92  CALCIUM 8.4* 8.3*   GFR Estimated Creatinine Clearance: 61.6 mL/min (by C-G formula based on SCr of 0.92 mg/dL). Liver Function Tests:  Recent Labs Lab 12/31/16 1953  AST 26  ALT 13*  ALKPHOS 62  BILITOT 0.8  PROT 7.4  ALBUMIN 3.4*   No results for input(s): LIPASE, AMYLASE in the last 168 hours. No results for input(s): AMMONIA in the last 168 hours. Coagulation profile No results for input(s): INR, PROTIME in the last 168 hours.  CBC:  Recent Labs Lab 12/31/16 1953 01/01/17 0426  WBC 7.9 6.9  NEUTROABS 5.5 4.8  HGB 12.8* 11.4*  HCT 40.5 36.1*  MCV 89.2 88.0  PLT 178 162   Cardiac Enzymes:  Recent Labs Lab 12/31/16 1953 01/01/17 0426  CKTOTAL 71 127   BNP: Invalid input(s): POCBNP CBG:  Recent Labs Lab 12/31/16 2015    GLUCAP 132*   D-Dimer No results for input(s): DDIMER in the last 72 hours. Hgb A1c No results for input(s): HGBA1C in the last 72 hours. Lipid Profile No results for input(s): CHOL, HDL, LDLCALC, TRIG, CHOLHDL, LDLDIRECT in the last 72 hours. Thyroid function studies No results for input(s): TSH, T4TOTAL, T3FREE, THYROIDAB in the last 72 hours.  Invalid input(s): FREET3 Anemia work up No results for input(s): VITAMINB12, FOLATE, FERRITIN, TIBC, IRON, RETICCTPCT in the last 72 hours. Microbiology No results found for this or any previous visit (from the past 240 hour(s)).   Discharge Instructions:   Discharge Instructions    Discharge instructions    Complete by:  As directed    DYS 1 thin liquid Valproic acid level 2 weeks   Increase activity slowly    Complete by:  As directed      Allergies as of 01/01/2017   No Known Allergies     Medication List    TAKE these medications   acetaminophen 500 MG tablet Commonly known as:  TYLENOL Take 500 mg by mouth every 4 (four) hours as needed for mild pain or headache.  bisacodyl 5 MG EC tablet Commonly known as:  DULCOLAX Take 5 mg by mouth daily as needed for mild constipation.   diazepam 10 MG Gel Commonly known as:  DIASTAT ACUDIAL Place 5 mg rectally once as needed for seizure.   divalproex 250 MG 24 hr tablet Commonly known as:  DEPAKOTE ER Take 3 tablets (750 mg total) by mouth 2 (two) times daily. What changed:  medication strength  how much to take   donepezil 10 MG tablet Commonly known as:  ARICEPT Take 10 mg by mouth at bedtime.   fluticasone 50 MCG/ACT nasal spray Commonly known as:  FLONASE Place 2 sprays into both nostrils daily.   HYDROcodone-homatropine 5-1.5 MG/5ML syrup Commonly known as:  HYCODAN Take 5 mLs by mouth every 6 (six) hours as needed for cough.   ketoconazole 2 % cream Commonly known as:  NIZORAL Apply 1 application topically 2 (two) times daily as needed for irritation.  Pt applies to buttocks.   lacosamide 200 MG Tabs tablet Commonly known as:  VIMPAT Take 1 tablet (200 mg total) by mouth 2 (two) times daily.   levETIRAcetam 500 MG 24 hr tablet Commonly known as:  KEPPRA XR Take 3 tablets (1,500 mg total) by mouth 2 (two) times daily.   loperamide 2 MG capsule Commonly known as:  IMODIUM Take 2 mg by mouth every 4 (four) hours as needed for diarrhea or loose stools.   LORazepam 1 MG tablet Commonly known as:  ATIVAN Take 2 tablets (2 mg total) by mouth at bedtime.   omeprazole 20 MG capsule Commonly known as:  PRILOSEC Take 20 mg by mouth 2 (two) times daily.   polyethylene glycol packet Commonly known as:  MIRALAX / GLYCOLAX Take 17 g by mouth daily as needed for mild constipation or moderate constipation.   prochlorperazine 10 MG tablet Commonly known as:  COMPAZINE Take 10 mg by mouth every 6 (six) hours as needed for nausea or vomiting.   risperiDONE 1 MG tablet Commonly known as:  RISPERDAL Take 1 tablet (1 mg total) by mouth at bedtime.   traZODone 50 MG tablet Commonly known as:  DESYREL Take 25 mg by mouth at bedtime as needed for sleep.      Follow-up Information    [name],[name], MD Follow up.   Specialty:  Family Medicine Contact information: [place] [address] [phone]        Melvenia Beam, MD Follow up in 2 week(s).   Specialty:  Neurology Why:  increased valproic acid Contact information: [place] [address] [phone]            Time coordinating discharge: 25 min  Signed:  [facility] 01/02/2017, 8:03 AM

## 2017-01-01 NOTE — Evaluation (Signed)
Clinical/Bedside Swallow Evaluation Patient Details  Name: Marco [name] MRN: [id number] Date of Birth: [birth year]  Today's Date: 01/01/2017 Time: SLP Start Time (ACUTE ONLY): 0830 SLP Stop Time (ACUTE ONLY): 0845 SLP Time Calculation (min) (ACUTE ONLY): 15 min  Past Medical History:  Past Medical History:  Diagnosis Date  . DEMENTIA   . Depression   . Developmental disability    developmentaly delayed  . Encounter for antineoplastic chemotherapy 10/10/2016  . Encounter for antineoplastic immunotherapy 04/05/2015  . Encounter for antineoplastic immunotherapy 12/21/2016  . GERD (gastroesophageal reflux disease)   . Hx of radiation therapy 11/07/13-12/24/13   lung,60Gy/72f  . Hypertension    no medications, no documented history per caregiver at preadmission  . Lung cancer ([facility]   . Mental disorder    sczizophrenia;moderate retardation  . Mental retardation   . [place] ([place]   . [place]izures [place]    Past Surgical History:  Past Surgical History:  Procedure Laterality Date  . CATARACT EXTRACTION W/PHACO  07/12/2011   Procedure: CATARACT EXTRACTION PHACO AND INTRAOCULAR LENS PLACEMENT (IOC);  Surgeon: GAdonis Brook  [facility];  Service: Ophthalmology;  Laterality: Left;  . EYE SURGERY     L eye  . VIDEO BRONCHOSCOPY Bilateral 10/01/2013   Procedure: VIDEO BRONCHOSCOPY WITHOUT FLUORO;  Surgeon: RCollene Gobble MD;  [facility]  Service: Cardiopulmonary;  Laterality: Bilateral;   HPI:  Marco GibbonsWatkinsis a 66y.o.malewith medical history significant of severe mental retardation, seizure disorder followed by Dr. ASarina Ill [facility] on chemotherapy followed by Dr. MEarlie Server Pt has a history of chronic dysphagia and has MBS in the past in 2016 that shows aspiration of thin liquids due to structural impairment and weakness from acute illnesses. Caregiver reports pt typically eats soft chopped foods and thin liquids, water and coffee only. Today pt presents  with complaints of having one generalized tonic-clonic seizure prior to arrival. One view CXR shows left mass, no acute process.    Assessment / Plan / Recommendation Clinical Impression  Pt demosntrates no indicatation of acute dysphagia though mild chronic dysphagia still suspected. Pt is alert, follows comamnds, strong and able to participate in self feeding. After routine tooth brushing oral cavity is healthy in appearance. Pt able to consume 8 oz of water with audible swallow which is consistent with known cervical osteophytes impeding epiglottic deflection. Despite intake, pt did not cough, voice dry, no significant multiple swallows. Pt also tolerated puree well. Recommend pt resume thin liquids as he takes at home, water and coffee only. Pt still likely have aspiration events, but given that he has tolerated diet at home without recurrent aspiration and caregivers are following recommendations for water only with oral care, best practices are being followed. For now will start puree foods and let caregiver slowly resume foods of choice as he is used to at home. Given no acute findings that impact pts function, will sign off at this time.  SLP Visit Diagnosis: Dysphagia, oropharyngeal phase (R13.12)    Aspiration Risk  Mild aspiration risk    Diet Recommendation Dysphagia 1 (Puree);Thin liquid   Liquid Administration via: Cup;Straw Medication Administration: Whole meds with puree Supervision: Staff to assist with self feeding;Full supervision/cueing for compensatory strategies Compensations: Slow rate;Small sips/bites Postural Changes: Seated upright at 90 degrees;Remain upright for at least 30 minutes after po intake    Other  Recommendations Oral Care Recommendations: Oral care BID Other Recommendations: Have oral suction available   Follow up Recommendations  24 hour supervision/assistance      Frequency and Duration            Prognosis        Swallow Study   General HPI:  Marco Pangborn Watkinsis a 66 y.o.malewith medical history significant of severe mental retardation, seizure disorder followed by Dr. Sarina Ill, [facility] on chemotherapy followed by Dr. Earlie Server; Pt has a history of chronic dysphagia and has MBS in the past in 2016 that shows aspiration of thin liquids due to structural impairment and weakness from acute illnesses. Caregiver reports pt typically eats soft chopped foods and thin liquids, water and coffee only. Today pt presents with complaints of having one generalized tonic-clonic seizure prior to arrival. One view CXR shows left mass, no acute process.  Type of Study: Bedside Swallow Evaluation Previous Swallow Assessment: see HPI Diet Prior to this Study: NPO Temperature Spikes Noted: No Respiratory Status: Room air History of Recent Intubation: No Behavior/Cognition: Alert;Cooperative;Pleasant mood Oral Cavity Assessment: Within Functional Limits Oral Care Completed by SLP: Yes Oral Cavity - Dentition: Poor condition;Missing dentition Vision: Functional for self-feeding Self-Feeding Abilities: Able to feed self Patient Positioning: Upright in bed Baseline Vocal Quality: Normal Volitional Cough: Strong Volitional Swallow: Able to elicit    Oral/Motor/Sensory Function Overall Oral Motor/Sensory Function: Within functional limits   Ice Chips     Thin Liquid Thin Liquid: Impaired Presentation: Cup;Straw;Self Fed;Spoon Pharyngeal  Phase Impairments: Suspected delayed Swallow    Nectar Thick Nectar Thick Liquid: Not tested   Honey Thick Honey Thick Liquid: Not tested   Puree Puree: Impaired Presentation: Spoon Pharyngeal Phase Impairments: Suspected delayed Swallow   Solid   GO   Solid: Not tested       Marco [place], MA CCC-SLP [phone]   Marco [name], Marco [name] 01/01/2017,8:59 AM

## 2017-01-01 NOTE — Progress Notes (Signed)
Pt Depakote prescription handed to pt's caregiver at bedside. Delia Heady RN

## 2017-01-03 ENCOUNTER — Telehealth: Payer: Self-pay | Admitting: Neurology

## 2017-01-03 NOTE — Telephone Encounter (Signed)
Pt's caregiver [name] called said the pt was at the ED yesterday for a seizure. The depakote was increased to '750mg'$  2 x day, it was '500mg'$  2 x day. The pt is groggy and increased inability to sit still.  Please call to discuss.

## 2017-01-04 ENCOUNTER — Ambulatory Visit (HOSPITAL_BASED_OUTPATIENT_CLINIC_OR_DEPARTMENT_OTHER): Payer: Medicare Other

## 2017-01-04 ENCOUNTER — Other Ambulatory Visit (HOSPITAL_BASED_OUTPATIENT_CLINIC_OR_DEPARTMENT_OTHER): Payer: Medicare Other

## 2017-01-04 ENCOUNTER — Ambulatory Visit (HOSPITAL_BASED_OUTPATIENT_CLINIC_OR_DEPARTMENT_OTHER): Payer: Medicare Other | Admitting: Internal Medicine

## 2017-01-04 ENCOUNTER — Encounter: Payer: Self-pay | Admitting: Internal Medicine

## 2017-01-04 VITALS — BP 99/80 | HR 90 | Temp 98.2°F | Resp 20 | Ht 72.0 in | Wt 124.0 lb

## 2017-01-04 DIAGNOSIS — C3492 Malignant neoplasm of unspecified part of left bronchus or lung: Secondary | ICD-10-CM

## 2017-01-04 DIAGNOSIS — R5382 Chronic fatigue, unspecified: Secondary | ICD-10-CM | POA: Diagnosis not present

## 2017-01-04 DIAGNOSIS — C3412 Malignant neoplasm of upper lobe, left bronchus or lung: Secondary | ICD-10-CM

## 2017-01-04 DIAGNOSIS — Z5112 Encounter for antineoplastic immunotherapy: Secondary | ICD-10-CM

## 2017-01-04 LAB — COMPREHENSIVE METABOLIC PANEL
ALT: 8 U/L (ref 0–55)
AST: 10 U/L (ref 5–34)
Albumin: 3.2 g/dL — ABNORMAL LOW (ref 3.5–5.0)
Alkaline Phosphatase: 65 U/L (ref 40–150)
Anion Gap: 9 mEq/L (ref 3–11)
BUN: 9.8 mg/dL (ref 7.0–26.0)
CALCIUM: 8.6 mg/dL (ref 8.4–10.4)
CHLORIDE: 107 meq/L (ref 98–109)
CO2: 29 meq/L (ref 22–29)
CREATININE: 0.8 mg/dL (ref 0.7–1.3)
EGFR: >90 mL/min/{1.73_m2} (ref >90)
GLUCOSE: 74 mg/dL (ref 70–140)
POTASSIUM: 3.6 meq/L (ref 3.5–5.1)
SODIUM: 145 meq/L (ref 136–145)
Total Bilirubin: 0.26 mg/dL (ref 0.20–1.20)
Total Protein: 7 g/dL (ref 6.4–8.3)

## 2017-01-04 LAB — CBC WITH DIFFERENTIAL/PLATELET
BASO%: 0.5 % (ref 0.0–2.0)
BASOS ABS: 0 10*3/uL (ref 0.0–0.1)
EOS%: 0.5 % (ref 0.0–7.0)
Eosinophils Absolute: 0 10*3/uL (ref 0.0–0.5)
HEMATOCRIT: 39.5 % (ref 38.4–49.9)
HGB: 12.7 g/dL — ABNORMAL LOW (ref 13.0–17.1)
LYMPH#: 0.7 10*3/uL — AB (ref 0.9–3.3)
LYMPH%: 11.4 % — ABNORMAL LOW (ref 14.0–49.0)
MCH: 27.9 pg (ref 27.2–33.4)
MCHC: 32.2 g/dL (ref 32.0–36.0)
MCV: 86.5 fL (ref 79.3–98.0)
MONO#: 0.4 10*3/uL (ref 0.1–0.9)
MONO%: 7.1 % (ref 0.0–14.0)
NEUT#: 5 10*3/uL (ref 1.5–6.5)
NEUT%: 80.5 % — AB (ref 39.0–75.0)
Platelets: 174 10*3/uL (ref 140–400)
RBC: 4.56 10*6/uL (ref 4.20–5.82)
RDW: 14 % (ref 11.0–14.6)
WBC: 6.2 10*3/uL (ref 4.0–10.3)

## 2017-01-04 LAB — TSH: TSH: 0.498 m[IU]/L (ref 0.320–4.118)

## 2017-01-04 MED ORDER — SODIUM CHLORIDE 0.9 % IV SOLN
240.0000 mg | Freq: Once | INTRAVENOUS | Status: AC
  Administered 2017-01-04: 240 mg via INTRAVENOUS
  Filled 2017-01-04: qty 24

## 2017-01-04 MED ORDER — SODIUM CHLORIDE 0.9 % IV SOLN
Freq: Once | INTRAVENOUS | Status: AC
  Administered 2017-01-04: 14:00:00 via INTRAVENOUS

## 2017-01-04 NOTE — Patient Instructions (Signed)
[facility] Discharge Instructions for Patients Receiving Chemotherapy  Today you received the following chemotherapy agents Nivolumab To help prevent nausea and vomiting after your treatment, we encourage you to take your nausea medication as prescribed.   If you develop nausea and vomiting that is not controlled by your nausea medication, call the clinic.   BELOW ARE SYMPTOMS THAT SHOULD BE REPORTED IMMEDIATELY:  *FEVER GREATER THAN 100.5 F  *CHILLS WITH OR WITHOUT FEVER  NAUSEA AND VOMITING THAT IS NOT CONTROLLED WITH YOUR NAUSEA MEDICATION  *UNUSUAL SHORTNESS OF BREATH  *UNUSUAL BRUISING OR BLEEDING  TENDERNESS IN MOUTH AND THROAT WITH OR WITHOUT PRESENCE OF ULCERS  *URINARY PROBLEMS  *BOWEL PROBLEMS  UNUSUAL RASH Items with * indicate a potential emergency and should be followed up as soon as possible.  Feel free to call the clinic you have any questions or concerns. The clinic phone number is [phone].  Please show the [facility] at check-in to the Emergency Department and triage nurse.  Nivolumab injection What is this medicine? NIVOLUMAB (nye VOL ue mab) is a monoclonal antibody. It is used to treat melanoma, lung cancer, kidney cancer, head and neck cancer, Hodgkin lymphoma, urothelial cancer, colon cancer, and liver cancer. This medicine may be used for other purposes; ask your health care provider or pharmacist if you have questions. COMMON BRAND NAME(S): Opdivo What should I tell my health care provider before I take this medicine? They need to know if you have any of these conditions: -diabetes -immune system problems -kidney disease -liver disease -lung disease -organ transplant -stomach or intestine problems -thyroid disease -an unusual or allergic reaction to nivolumab, other medicines, foods, dyes, or preservatives -pregnant or trying to get pregnant -breast-feeding How should I use this medicine? This medicine is for  infusion into a vein. It is given by a health care professional in a hospital or clinic setting. A special MedGuide will be given to you before each treatment. Be sure to read this information carefully each time. Talk to your pediatrician regarding the use of this medicine in children. While this drug may be prescribed for children as young as 12 years for selected conditions, precautions do apply. Overdosage: If you think you have taken too much of this medicine contact a poison control center or emergency room at once. NOTE: This medicine is only for you. Do not share this medicine with others. What if I miss a dose? It is important not to miss your dose. Call your doctor or health care professional if you are unable to keep an appointment. What may interact with this medicine? Interactions have not been studied. Give your health care provider a list of all the medicines, herbs, non-prescription drugs, or dietary supplements you use. Also tell them if you smoke, drink alcohol, or use illegal drugs. Some items may interact with your medicine. This list may not describe all possible interactions. Give your health care provider a list of all the medicines, herbs, non-prescription drugs, or dietary supplements you use. Also tell them if you smoke, drink alcohol, or use illegal drugs. Some items may interact with your medicine. What should I watch for while using this medicine? This drug may make you feel generally unwell. Continue your course of treatment even though you feel ill unless your doctor tells you to stop. You may need blood work done while you are taking this medicine. Do not become pregnant while taking this medicine or for 5 months after stopping it. Women  should inform their doctor if they wish to become pregnant or think they might be pregnant. There is a potential for serious side effects to an unborn child. Talk to your health care professional or pharmacist for more information. Do  not breast-feed an infant while taking this medicine. What side effects may I notice from receiving this medicine? Side effects that you should report to your doctor or health care professional as soon as possible: -allergic reactions like skin rash, itching or hives, swelling of the face, lips, or tongue -black, tarry stools -blood in the urine -bloody or watery diarrhea -changes in vision -change in sex drive -changes in emotions or moods -chest pain -confusion -cough -decreased appetite -diarrhea -facial flushing -feeling faint or lightheaded -fever, chills -hair loss -hallucination, loss of contact with reality -headache -irritable -joint pain -loss of memory -muscle pain -muscle weakness -seizures -shortness of breath -signs and symptoms of high blood sugar such as dizziness; dry mouth; dry skin; fruity breath; nausea; stomach pain; increased hunger or thirst; increased urination -signs and symptoms of kidney injury like trouble passing urine or change in the amount of urine -signs and symptoms of liver injury like dark yellow or brown urine; general ill feeling or flu-like symptoms; light-colored stools; loss of appetite; nausea; right upper belly pain; unusually weak or tired; yellowing of the eyes or skin -stiff neck -swelling of the ankles, feet, hands -weight gain Side effects that usually do not require medical attention (report to your doctor or health care professional if they continue or are bothersome): -bone pain -constipation -tiredness -vomiting This list may not describe all possible side effects. Call your doctor for medical advice about side effects. You may report side effects to FDA at 1-800-FDA-1088. Where should I keep my medicine? This drug is given in a hospital or clinic and will not be stored at home. NOTE: This sheet is a summary. It may not cover all possible information. If you have questions about this medicine, talk to your doctor,  pharmacist, or health care provider.  2018 Elsevier/Gold Standard (2016-05-29 17:49:34)

## 2017-01-04 NOTE — Telephone Encounter (Signed)
Plese ask them to try to continue with the Depakote for one week and see if he adjusts. Otherwise we have to add a 4th medication (he is already on high doses of vimpat and keppra) thanks

## 2017-01-04 NOTE — Progress Notes (Signed)
[facility] [phone]   [phone]  OFFICE PROGRESS NOTE  [name],[name], MD [address] [place] [address]  DIAGNOSIS: Stage IIA (T2a., N1, M0) non-small cell lung cancer, squamous cell carcinoma presented with central left upper lobe mass as well as hilar lymphadenopathy diagnosed in January of 2015.  PRIOR THERAPY:  1. Concurrent chemoradiation with weekly carboplatin for AUC of 2 and paclitaxel 45 mg/M2, status post 7 cycles. Last cycle was given 12/22/2013  2. Systemic chemotherapy with carboplatin for AUC of 5 and paclitaxel 175 MG/M2 with Neulasta support every 3 weeks. Status post 3 cycles. 3.  Immunotherapy with Nivolumab 3 mg/kg given every 2 weeks. Status post 9 cycles. Discontinued secondary to noncompliance but the patient has a stable disease.  CURRENT THERAPY: Treatment again with immunotherapy with Nivolumab 240 MG IV every 2 weeks. First dose 12/07/2016. Status post 2 cycles.  INTERVAL HISTORY: Marco [name] 66 y.o. male returns to the clinic today for follow-up visit accompanied by his caregiver. The patient is feeling fine today with no specific complaints. He tolerated the second cycle of his treatment with Nivolumab fairly well. He denied having any fatigue or weakness. He denied having any chest pain, shortness of breath, cough or hemoptysis. He has no nausea or vomiting. He is here today for evaluation before starting cycle #3.  MEDICAL HISTORY: Past Medical History:  Diagnosis Date  . DEMENTIA   . Depression   . Developmental disability    developmentaly delayed  . Encounter for antineoplastic chemotherapy 10/10/2016  . Encounter for antineoplastic immunotherapy 04/05/2015  . Encounter for antineoplastic immunotherapy 12/21/2016  . GERD (gastroesophageal reflux disease)   . Hx of radiation therapy 11/07/13-12/24/13   lung,60Gy/85f  . Hypertension    no medications, no documented history per  caregiver at preadmission  . [place]   . Mental disorder    sczizophrenia;moderate retardation  . Mental retardation   . Seizure disorder ([facility]   . Seizures ([facility])     ALLERGIES:  has No Known Allergies.  MEDICATIONS:  Current Outpatient Prescriptions  Medication Sig Dispense Refill  . acetaminophen (TYLENOL) 500 MG tablet Take 500 mg by mouth every 4 (four) hours as needed for mild pain or headache.     . bisacodyl (DULCOLAX) 5 MG EC tablet Take 5 mg by mouth daily as needed for mild constipation.     . diazepam (DIASTAT ACUDIAL) 10 MG GEL Place 5 mg rectally once as needed for seizure.    . divalproex (DEPAKOTE ER) 250 MG 24 hr tablet Take 3 tablets (750 mg total) by mouth 2 (two) times daily. 180 tablet 0  . donepezil (ARICEPT) 10 MG tablet Take 10 mg by mouth at bedtime.     . fluticasone (FLONASE) 50 MCG/ACT nasal spray Place 2 sprays into both nostrils daily.    .Marland KitchenHYDROcodone-homatropine (HYCODAN) 5-1.5 MG/5ML syrup Take 5 mLs by mouth every 6 (six) hours as needed for cough. 120 mL 0  . ketoconazole (NIZORAL) 2 % cream Apply 1 application topically 2 (two) times daily as needed for irritation. Pt applies to buttocks.     . lacosamide (VIMPAT) 200 MG TABS tablet Take 1 tablet (200 mg total) by mouth 2 (two) times daily. 60 tablet 1  . levETIRAcetam (KEPPRA XR) 500 MG 24 hr tablet Take 3 tablets (1,500 mg total) by mouth 2 (two) times daily. 180 tablet 11  . loperamide (IMODIUM) 2 MG capsule  Take 2 mg by mouth every 4 (four) hours as needed for diarrhea or loose stools.    Marland Kitchen LORazepam (ATIVAN) 1 MG tablet Take 2 tablets (2 mg total) by mouth at bedtime. 30 tablet 0  . omeprazole (PRILOSEC) 20 MG capsule Take 20 mg by mouth 2 (two) times daily.    . polyethylene glycol (MIRALAX / GLYCOLAX) packet Take 17 g by mouth daily as needed for mild constipation or moderate constipation.    . prochlorperazine (COMPAZINE) 10 MG tablet Take 10 mg by mouth every 6 (six) hours as needed  for nausea or vomiting.    . risperiDONE (RISPERDAL) 1 MG tablet Take 1 tablet (1 mg total) by mouth at bedtime. 30 tablet 0  . traZODone (DESYREL) 50 MG tablet Take 25 mg by mouth at bedtime as needed for sleep.     No current facility-administered medications for this visit.     SURGICAL HISTORY:  Past Surgical History:  Procedure Laterality Date  . CATARACT EXTRACTION W/PHACO  07/12/2011   Procedure: CATARACT EXTRACTION PHACO AND INTRAOCULAR LENS PLACEMENT (IOC);  Surgeon: Adonis Brook;  Location: [facility];  Service: Ophthalmology;  Laterality: Left;  . EYE SURGERY     L eye  . VIDEO BRONCHOSCOPY Bilateral 10/01/2013   Procedure: VIDEO BRONCHOSCOPY WITHOUT FLUORO;  Surgeon: Collene Gobble, MD;  Location: [facility];  Service: Cardiopulmonary;  Laterality: Bilateral;    REVIEW OF SYSTEMS:  A comprehensive review of systems was negative.   PHYSICAL EXAMINATION: General appearance: alert, cooperative and no distress Head: Normocephalic, without obvious abnormality, atraumatic Neck: no adenopathy, no JVD, supple, symmetrical, trachea midline and thyroid not enlarged, symmetric, no tenderness/mass/nodules Lymph nodes: Cervical, supraclavicular, and axillary nodes normal. Resp: clear to auscultation bilaterally Back: symmetric, no curvature. ROM normal. No CVA tenderness. Cardio: regular rate and rhythm, S1, S2 normal, no murmur, click, rub or gallop GI: soft, non-tender; bowel sounds normal; no masses,  no organomegaly Extremities: extremities normal, atraumatic, no cyanosis or edema  ECOG PERFORMANCE STATUS: 1 - Symptomatic but completely ambulatory  Blood pressure 99/80, pulse 90, temperature 98.2 F (36.8 C), temperature source Oral, resp. rate 20, height 6' (1.829 m), weight 124 lb (56.2 kg), SpO2 97 %.  LABORATORY DATA: Lab Results  Component Value Date   WBC 6.2 01/04/2017   HGB 12.7 (L) 01/04/2017   HCT 39.5 01/04/2017   MCV 86.5 01/04/2017   PLT 174 01/04/2017       Chemistry      Component Value Date/Time   NA 142 01/01/2017 0426   NA 145 12/21/2016 1156   K 3.9 01/01/2017 0426   K 4.0 12/21/2016 1156   CL 108 01/01/2017 0426   CO2 27 01/01/2017 0426   CO2 30 (H) 12/21/2016 1156   BUN 12 01/01/2017 0426   BUN 9.4 12/21/2016 1156   CREATININE 0.92 01/01/2017 0426   CREATININE 0.8 12/21/2016 1156      Component Value Date/Time   CALCIUM 8.3 (L) 01/01/2017 0426   CALCIUM 8.4 12/21/2016 1156   ALKPHOS 62 12/31/2016 1953   ALKPHOS 60 12/21/2016 1156   AST 26 12/31/2016 1953   AST 8 12/21/2016 1156   ALT 13 (L) 12/31/2016 1953   ALT <6 12/21/2016 1156   BILITOT 0.8 12/31/2016 1953   BILITOT 0.22 12/21/2016 1156       RADIOGRAPHIC STUDIES: Ct Head Wo Contrast  Result Date: 12/31/2016 CLINICAL DATA:  Postictal.  History of lung cancer. EXAM: CT HEAD WITHOUT CONTRAST TECHNIQUE: Contiguous axial  images were obtained from the base of the skull through the vertex without intravenous contrast. COMPARISON:  04/23/2016 FINDINGS: Brain: No evidence of acute infarction, hemorrhage, hydrocephalus, extra-axial collection or mass lesion/mass effect. Mild brain parenchymal volume loss and periventricular microangiopathy. Vascular: No hyperdense vessel. Skull: Normal. Negative for fracture or focal lesion. Sinuses/Orbits: No acute finding. Other: None. IMPRESSION: No acute intracranial abnormality. Mild brain parenchymal atrophy and chronic microvascular disease. Electronically Signed   By: Fidela Salisbury M.D.   On: 12/31/2016 21:46   Dg Chest Port 1 View  Result Date: 01/01/2017 CLINICAL DATA:  Recurrent seizures. History of recurrent lung cancer. EXAM: PORTABLE CHEST 1 VIEW COMPARISON:  CT chest September 07, 2016 and PET- CT September 29, 2016 FINDINGS: Heart size is normal. LEFT perihilar mass as seen on prior CT. No pleural effusion. No pneumothorax. Soft tissue planes included osseous structures are nonsuspicious. IMPRESSION: Re- demonstration of LEFT  perihilar mass without acute cardiopulmonary process. Electronically Signed   By: Elon Alas M.D.   On: 01/01/2017 01:18   ASSESSMENT AND PLAN:  This is a very pleasant 66 years old African-American male with recurrent non-small cell lung cancer, squamous cell carcinoma initially diagnosed as unresectable a stage IIa status post concurrent chemoradiation followed by 3 cycles of consolidation chemotherapy. The patient is currently on treatment with immunotherapy with Nivolumab status post 2 cycles. He tolerated the last cycle of his treatment well. I recommended for him to proceed with cycle #3 today as a scheduled. I would see him back for follow-up visit in 2 weeks for evaluation before starting cycle #4. The patient was advised to call immediately if he has any concerning symptoms in the interval. The patient voices understanding of current disease status and treatment options and is in agreement with the current care plan. All questions were answered. The patient knows to call the clinic with any problems, questions or concerns. We can certainly see the patient much sooner if necessary. I spent 10 minutes counseling the patient face to face. The total time spent in the appointment was 15 minutes.  Disclaimer: This note was dictated with voice recognition software. Similar sounding words can inadvertently be transcribed and may not be corrected upon review.

## 2017-01-05 NOTE — Telephone Encounter (Signed)
Called pt's caregiver who reports that pt is doing better today, believe earlier symptoms were d/t postictal state. Continues Depakote 750 mg BID, tolerating well. Verbalized understanding and appreciation for call.

## 2017-01-08 ENCOUNTER — Other Ambulatory Visit: Payer: Self-pay | Admitting: Neurology

## 2017-01-08 ENCOUNTER — Other Ambulatory Visit: Payer: Self-pay

## 2017-01-08 MED ORDER — LACOSAMIDE 200 MG PO TABS: 200.0000 mg | ORAL_TABLET | Freq: Two times a day (BID) | ORAL | 1 refills | Status: DC

## 2017-01-08 NOTE — Telephone Encounter (Signed)
Rx printed, awaiting signature. 

## 2017-01-09 ENCOUNTER — Other Ambulatory Visit: Payer: Self-pay | Admitting: Neurology

## 2017-01-09 NOTE — Telephone Encounter (Signed)
Rx signed, faxed to pharmacy. 

## 2017-01-14 ENCOUNTER — Other Ambulatory Visit: Payer: Self-pay

## 2017-01-14 ENCOUNTER — Inpatient Hospital Stay (HOSPITAL_COMMUNITY)
Admission: EM | Admit: 2017-01-14 | Discharge: 2017-01-17 | DRG: 101 | Disposition: A | Discharge status: Home / Self Care | Payer: Medicare Other | Attending: Internal Medicine | Admitting: Internal Medicine

## 2017-01-14 ENCOUNTER — Emergency Department (HOSPITAL_COMMUNITY): Payer: Medicare Other

## 2017-01-14 DIAGNOSIS — G40909 Epilepsy, unspecified, not intractable, without status epilepticus: Principal | ICD-10-CM | POA: Diagnosis present

## 2017-01-14 DIAGNOSIS — Z79899 Other long term (current) drug therapy: Secondary | ICD-10-CM | POA: Diagnosis not present

## 2017-01-14 DIAGNOSIS — Z8249 Family history of ischemic heart disease and other diseases of the circulatory system: Secondary | ICD-10-CM

## 2017-01-14 DIAGNOSIS — Z87891 Personal history of nicotine dependence: Secondary | ICD-10-CM | POA: Diagnosis not present

## 2017-01-14 DIAGNOSIS — N289 Disorder of kidney and ureter, unspecified: Secondary | ICD-10-CM | POA: Diagnosis present

## 2017-01-14 DIAGNOSIS — K219 Gastro-esophageal reflux disease without esophagitis: Secondary | ICD-10-CM | POA: Diagnosis not present

## 2017-01-14 DIAGNOSIS — I10 Essential (primary) hypertension: Secondary | ICD-10-CM | POA: Diagnosis not present

## 2017-01-14 DIAGNOSIS — F72 Severe intellectual disabilities: Secondary | ICD-10-CM | POA: Diagnosis present

## 2017-01-14 DIAGNOSIS — C3492 Malignant neoplasm of unspecified part of left bronchus or lung: Secondary | ICD-10-CM | POA: Diagnosis present

## 2017-01-14 DIAGNOSIS — C799 Secondary malignant neoplasm of unspecified site: Secondary | ICD-10-CM

## 2017-01-14 DIAGNOSIS — F0390 Unspecified dementia without behavioral disturbance: Secondary | ICD-10-CM | POA: Diagnosis not present

## 2017-01-14 DIAGNOSIS — N179 Acute kidney failure, unspecified: Secondary | ICD-10-CM | POA: Diagnosis not present

## 2017-01-14 DIAGNOSIS — R569 Unspecified convulsions: Secondary | ICD-10-CM | POA: Diagnosis not present

## 2017-01-14 DIAGNOSIS — R509 Fever, unspecified: Secondary | ICD-10-CM | POA: Diagnosis present

## 2017-01-14 DIAGNOSIS — E872 Acidosis, unspecified: Secondary | ICD-10-CM

## 2017-01-14 LAB — URINALYSIS, ROUTINE W REFLEX MICROSCOPIC
Bilirubin Urine: NEGATIVE
GLUCOSE, UA: NEGATIVE mg/dL
Hgb urine dipstick: NEGATIVE
KETONES UR: NEGATIVE mg/dL
Leukocytes, UA: NEGATIVE
Nitrite: NEGATIVE
PH: 5 (ref 5.0–8.0)
PROTEIN: 30 mg/dL — AB
Specific Gravity, Urine: 1.02 (ref 1.005–1.030)

## 2017-01-14 LAB — CBC WITH DIFFERENTIAL/PLATELET
BASOS ABS: 0 10*3/uL (ref 0.0–0.1)
BASOS PCT: 0 %
EOS ABS: 0 10*3/uL (ref 0.0–0.7)
EOS PCT: 0 %
HCT: 41.7 % (ref 39.0–52.0)
Hemoglobin: 13.1 g/dL (ref 13.0–17.0)
LYMPHS ABS: 0.8 10*3/uL (ref 0.7–4.0)
Lymphocytes Relative: 8 %
MCH: 27.9 pg (ref 26.0–34.0)
MCHC: 31.4 g/dL (ref 30.0–36.0)
MCV: 88.7 fL (ref 78.0–100.0)
Monocytes Absolute: 0.4 10*3/uL (ref 0.1–1.0)
Monocytes Relative: 4 %
Neutro Abs: 10 10*3/uL — ABNORMAL HIGH (ref 1.7–7.7)
Neutrophils Relative %: 88 %
PLATELETS: 179 10*3/uL (ref 150–400)
RBC: 4.7 MIL/uL (ref 4.22–5.81)
RDW: 13.5 % (ref 11.5–15.5)
WBC: 11.3 10*3/uL — AB (ref 4.0–10.5)

## 2017-01-14 LAB — COMPREHENSIVE METABOLIC PANEL
ALK PHOS: 64 U/L (ref 38–126)
ALT: 10 U/L — ABNORMAL LOW (ref 17–63)
ANION GAP: 15 (ref 5–15)
AST: 21 U/L (ref 15–41)
Albumin: 3.6 g/dL (ref 3.5–5.0)
BILIRUBIN TOTAL: 0.3 mg/dL (ref 0.3–1.2)
BUN: 18 mg/dL (ref 6–20)
CALCIUM: 9.1 mg/dL (ref 8.9–10.3)
CO2: 20 mmol/L — ABNORMAL LOW (ref 22–32)
Chloride: 104 mmol/L (ref 101–111)
Creatinine, Ser: 1.47 mg/dL — ABNORMAL HIGH (ref 0.61–1.24)
GFR calc non Af Amer: 47 mL/min — ABNORMAL LOW (ref >60)
GFR, EST AFRICAN AMERICAN: 55 mL/min — AB (ref >60)
GLUCOSE: 195 mg/dL — AB (ref 65–99)
Potassium: 3.6 mmol/L (ref 3.5–5.1)
Sodium: 139 mmol/L (ref 135–145)
TOTAL PROTEIN: 7.5 g/dL (ref 6.5–8.1)

## 2017-01-14 LAB — I-STAT CG4 LACTIC ACID, ED
LACTIC ACID, VENOUS: 8.15 mmol/L — AB (ref 0.5–1.9)
Lactic Acid, Venous: 3.49 mmol/L (ref 0.5–1.9)

## 2017-01-14 LAB — I-STAT TROPONIN, ED: TROPONIN I, POC: 0 ng/mL (ref 0.00–0.08)

## 2017-01-14 LAB — RAPID URINE DRUG SCREEN, HOSP PERFORMED
Amphetamines: NOT DETECTED
Barbiturates: NOT DETECTED
Benzodiazepines: POSITIVE — AB
COCAINE: NOT DETECTED
Opiates: NOT DETECTED
Tetrahydrocannabinol: NOT DETECTED

## 2017-01-14 LAB — I-STAT CHEM 8, ED
BUN: 22 mg/dL — AB (ref 6–20)
CHLORIDE: 106 mmol/L (ref 101–111)
CREATININE: 1.2 mg/dL (ref 0.61–1.24)
Calcium, Ion: 1.07 mmol/L — ABNORMAL LOW (ref 1.15–1.40)
GLUCOSE: 189 mg/dL — AB (ref 65–99)
HCT: 41 % (ref 39.0–52.0)
Hemoglobin: 13.9 g/dL (ref 13.0–17.0)
POTASSIUM: 3.6 mmol/L (ref 3.5–5.1)
Sodium: 141 mmol/L (ref 135–145)
TCO2: 22 mmol/L (ref 0–100)

## 2017-01-14 LAB — PHOSPHORUS: PHOSPHORUS: 3.5 mg/dL (ref 2.5–4.6)

## 2017-01-14 LAB — ACETAMINOPHEN LEVEL

## 2017-01-14 LAB — MAGNESIUM: MAGNESIUM: 2 mg/dL (ref 1.7–2.4)

## 2017-01-14 LAB — CBG MONITORING, ED: Glucose-Capillary: 187 mg/dL — ABNORMAL HIGH (ref 65–99)

## 2017-01-14 LAB — VALPROIC ACID LEVEL: Valproic Acid Lvl: 39 ug/mL — ABNORMAL LOW (ref 50.0–100.0)

## 2017-01-14 LAB — AMMONIA: Ammonia: 54 umol/L — ABNORMAL HIGH (ref 9–35)

## 2017-01-14 LAB — SALICYLATE LEVEL

## 2017-01-14 LAB — ETHANOL

## 2017-01-14 MED ORDER — ACETAMINOPHEN 325 MG PO TABS: 650.0000 mg | ORAL_TABLET | ORAL | Status: DC | PRN

## 2017-01-14 MED ORDER — HYDRALAZINE HCL 20 MG/ML IJ SOLN: 10.0000 mg | Freq: Three times a day (TID) | INTRAMUSCULAR | Status: DC | PRN

## 2017-01-14 MED ORDER — ONDANSETRON HCL 4 MG PO TABS: 4.0000 mg | ORAL_TABLET | Freq: Four times a day (QID) | ORAL | Status: DC | PRN

## 2017-01-14 MED ORDER — VALPROATE SODIUM 500 MG/5ML IV SOLN
250.0000 mg | Freq: Once | INTRAVENOUS | Status: DC
  Filled 2017-01-14: qty 2.5

## 2017-01-14 MED ORDER — VALPROATE SODIUM 500 MG/5ML IV SOLN
250.0000 mg | Freq: Once | INTRAVENOUS | Status: AC
  Administered 2017-01-14: 250 mg via INTRAVENOUS
  Filled 2017-01-14: qty 2.5

## 2017-01-14 MED ORDER — VANCOMYCIN HCL 10 G IV SOLR
1500.0000 mg | Freq: Once | INTRAVENOUS | Status: AC
  Administered 2017-01-14: 1500 mg via INTRAVENOUS
  Filled 2017-01-14: qty 1500

## 2017-01-14 MED ORDER — LORAZEPAM 2 MG/ML IJ SOLN: 4.0000 mg | INTRAMUSCULAR | Status: DC

## 2017-01-14 MED ORDER — PIPERACILLIN-TAZOBACTAM 4.5 G IVPB
4.5000 g | Freq: Once | INTRAVENOUS | Status: AC
  Administered 2017-01-14: 4.5 g via INTRAVENOUS
  Filled 2017-01-14: qty 100

## 2017-01-14 MED ORDER — VALPROATE SODIUM 500 MG/5ML IV SOLN
750.0000 mg | Freq: Once | INTRAVENOUS | Status: DC
  Administered 2017-01-14: 750 mg via INTRAVENOUS
  Filled 2017-01-14: qty 7.5

## 2017-01-14 MED ORDER — ONDANSETRON HCL 4 MG/2ML IJ SOLN: 4.0000 mg | Freq: Four times a day (QID) | INTRAMUSCULAR | Status: DC | PRN

## 2017-01-14 MED ORDER — HEPARIN SODIUM (PORCINE) 5000 UNIT/ML IJ SOLN
5000.0000 [IU] | Freq: Three times a day (TID) | INTRAMUSCULAR | Status: DC
  Administered 2017-01-14 – 2017-01-17 (×6): 5000 [IU] via SUBCUTANEOUS
  Filled 2017-01-14 (×8): qty 1

## 2017-01-14 MED ORDER — SODIUM CHLORIDE 0.9 % IV BOLUS (SEPSIS)
1000.0000 mL | Freq: Once | INTRAVENOUS | Status: AC
  Administered 2017-01-14: 1000 mL via INTRAVENOUS

## 2017-01-14 MED ORDER — POLYETHYLENE GLYCOL 3350 17 G PO PACK: 17.0000 g | PACK | Freq: Every day | ORAL | Status: DC | PRN

## 2017-01-14 MED ORDER — ACETAMINOPHEN 650 MG RE SUPP: 650.0000 mg | RECTAL | Status: DC | PRN

## 2017-01-14 MED ORDER — SODIUM CHLORIDE 0.9 % IV SOLN
1250.0000 mg | Freq: Once | INTRAVENOUS | Status: AC
  Administered 2017-01-14: 1250 mg via INTRAVENOUS
  Filled 2017-01-14: qty 25

## 2017-01-14 MED ORDER — SODIUM CHLORIDE 0.9 % IV SOLN
75.0000 mL/h | INTRAVENOUS | Status: DC
  Administered 2017-01-14 – 2017-01-15 (×2): 75 mL/h via INTRAVENOUS

## 2017-01-14 MED ORDER — SODIUM CHLORIDE 0.9 % IV SOLN
1500.0000 mg | Freq: Once | INTRAVENOUS | Status: AC
  Administered 2017-01-14: 1500 mg via INTRAVENOUS
  Filled 2017-01-14: qty 15

## 2017-01-14 MED ORDER — LORAZEPAM 2 MG/ML IJ SOLN
2.0000 mg | Freq: Once | INTRAMUSCULAR | Status: AC
  Administered 2017-01-14: 2 mg via INTRAVENOUS
  Filled 2017-01-14: qty 1

## 2017-01-14 MED ORDER — SODIUM CHLORIDE 0.9 % IV BOLUS (SEPSIS)
1000.0000 mL | Freq: Once | INTRAVENOUS | Status: AC
Start: 2017-01-14 — End: 2017-01-14
  Administered 2017-01-14: 1000 mL via INTRAVENOUS

## 2017-01-14 MED ORDER — PANTOPRAZOLE SODIUM 40 MG IV SOLR
40.0000 mg | Freq: Every day | INTRAVENOUS | Status: DC
  Administered 2017-01-14 – 2017-01-16 (×3): 40 mg via INTRAVENOUS
  Filled 2017-01-14 (×3): qty 40

## 2017-01-14 NOTE — ED Notes (Signed)
I Stat Lactic Acid results shown to Dr. Lennice Sites

## 2017-01-14 NOTE — Consult Note (Signed)
Reason for Consult: Seizures Referring Physician: Dr. Myra Gianotti Marco [name] is an 66 y.o. male.  HPI: Patient with a history of seizures on 3 AEDs presents with recurrent GTCS and altered mental status.  No family to give history now.  CT Brain had too much motion artifact to analyze.  He does have a history of lung cancer.  He has received VPA IV 750 mg and IV Keppra 1500 mg and IV fosphenytoin 1250 mg in the ER in addition to 4 mg IV Ativan.  No past medical history on file.  No past surgical history on file.  No family history on file.  Social History:  has no tobacco, alcohol, and drug history on file.  Allergies: No Known Allergies  Prior to Admission medications   Medication Sig Start Date End Date Taking? Authorizing Provider  acetaminophen (TYLENOL) 500 MG tablet Take 500 mg by mouth every 4 (four) hours as needed (headache/pain).   Yes [provider]  bisacodyl (DULCOLAX) 5 MG EC tablet Take 5 mg by mouth daily as needed (constipation).   Yes [provider]  diazepam (DIASTAT ACUDIAL) 10 MG GEL Place 10 mg rectally once as needed for seizure (may repeat in 4-6 hours once).   Yes [provider]  divalproex (DEPAKOTE ER) 250 MG 24 hr tablet Take 750 mg by mouth 2 (two) times daily.   Yes [provider]  donepezil (ARICEPT) 10 MG tablet Take 10 mg by mouth at bedtime.   Yes [provider]  fluticasone (FLONASE) 50 MCG/ACT nasal spray Place 1 spray into both nostrils daily.   Yes [provider]  HYDROcodone-homatropine (HYDROMET) 5-1.5 MG/5ML syrup Take 5 mLs by mouth every 6 (six) hours as needed for cough.   Yes [provider]  ketoconazole (NIZORAL) 2 % cream Apply 1 application topically See admin instructions. Apply to feet twice a day as needed for rash/itching   Yes [provider]  lacosamide (VIMPAT) 200 MG TABS tablet Take 200 mg by mouth 2 (two) times daily.   Yes [provider]   levETIRAcetam (KEPPRA XR) 500 MG 24 hr tablet Take 1,500 mg by mouth 2 (two) times daily.   Yes [provider]  loperamide (IMODIUM A-D) 2 MG tablet Take 2 mg by mouth every 4 (four) hours as needed for diarrhea or loose stools.   Yes [provider]  LORazepam (ATIVAN) 1 MG tablet Take 2 mg by mouth at bedtime.   Yes [provider]  omeprazole (PRILOSEC) 20 MG capsule Take 20 mg by mouth 2 (two) times daily.   Yes [provider]  polyethylene glycol (MIRALAX / GLYCOLAX) packet Take 17 g by mouth daily as needed (constipation). Mix in 8 oz water or juice and drink   Yes [provider]  prochlorperazine (COMPAZINE) 10 MG tablet Take 10 mg by mouth every 6 (six) hours as needed for vomiting.   Yes [provider]  risperiDONE (RISPERDAL) 1 MG tablet Take 1 mg by mouth at bedtime.   Yes [provider]  traZODone (DESYREL) 50 MG tablet Take 25 mg by mouth at bedtime as needed for sleep.   Yes [provider]    Medications:  Prior to Admission:  Prescriptions Prior to Admission  Medication Sig Dispense Refill Last Dose  . acetaminophen (TYLENOL) 500 MG tablet Take 500 mg by mouth every 4 (four) hours as needed (headache/pain).   unknown  . bisacodyl (DULCOLAX) 5 MG EC tablet Take  5 mg by mouth daily as needed (constipation).   unknown  . diazepam (DIASTAT ACUDIAL) 10 MG GEL Place 10 mg rectally once as needed for seizure (may repeat in 4-6 hours once).   unknown  . divalproex (DEPAKOTE ER) 250 MG 24 hr tablet Take 750 mg by mouth 2 (two) times daily.   01/14/2017 at 700  . donepezil (ARICEPT) 10 MG tablet Take 10 mg by mouth at bedtime.   01/13/2017 at 1900  . fluticasone (FLONASE) 50 MCG/ACT nasal spray Place 1 spray into both nostrils daily.   01/14/2017 at 700  . HYDROcodone-homatropine (HYDROMET) 5-1.5 MG/5ML syrup Take 5 mLs by mouth every 6 (six) hours as needed for cough.   01/10/2017  . ketoconazole (NIZORAL) 2 % cream  Apply 1 application topically See admin instructions. Apply to feet twice a day as needed for rash/itching   unknown  . lacosamide (VIMPAT) 200 MG TABS tablet Take 200 mg by mouth 2 (two) times daily.   01/14/2017 at 700  . levETIRAcetam (KEPPRA XR) 500 MG 24 hr tablet Take 1,500 mg by mouth 2 (two) times daily.   01/14/2017 at 700  . loperamide (IMODIUM A-D) 2 MG tablet Take 2 mg by mouth every 4 (four) hours as needed for diarrhea or loose stools.   unknown  . LORazepam (ATIVAN) 1 MG tablet Take 2 mg by mouth at bedtime.   01/13/2017 at 1900  . omeprazole (PRILOSEC) 20 MG capsule Take 20 mg by mouth 2 (two) times daily.   01/14/2017 at 700  . polyethylene glycol (MIRALAX / GLYCOLAX) packet Take 17 g by mouth daily as needed (constipation). Mix in 8 oz water or juice and drink   unknown  . prochlorperazine (COMPAZINE) 10 MG tablet Take 10 mg by mouth every 6 (six) hours as needed for vomiting.   unknown  . risperiDONE (RISPERDAL) 1 MG tablet Take 1 mg by mouth at bedtime.   01/13/2017 at 1900  . traZODone (DESYREL) 50 MG tablet Take 25 mg by mouth at bedtime as needed for sleep.   01/04/2017    Results for orders placed or performed during the hospital encounter of 01/14/17 (from the past 48 hour(s))  CBG monitoring, ED     Status: Abnormal   Collection Time: 01/14/17 12:54 PM  Result Value Ref Range   Glucose-Capillary 187 (H) 65 - 99 mg/dL  Comprehensive metabolic panel     Status: Abnormal   Collection Time: 01/14/17 12:57 PM  Result Value Ref Range   Sodium 139 135 - 145 mmol/L   Potassium 3.6 3.5 - 5.1 mmol/L   Chloride 104 101 - 111 mmol/L   CO2 20 (L) 22 - 32 mmol/L   Glucose, Bld 195 (H) 65 - 99 mg/dL   BUN 18 6 - 20 mg/dL   Creatinine, Ser 1.47 (H) 0.61 - 1.24 mg/dL   Calcium 9.1 8.9 - 10.3 mg/dL   Total Protein 7.5 6.5 - 8.1 g/dL   Albumin 3.6 3.5 - 5.0 g/dL   AST 21 15 - 41 U/L   ALT 10 (L) 17 - 63 U/L   Alkaline Phosphatase 64 38 - 126 U/L   Total Bilirubin 0.3 0.3 - 1.2 mg/dL    GFR calc non Af Amer 47 (L) >60 mL/min   GFR calc Af Amer 55 (L) >60 mL/min    Comment: (NOTE) The eGFR has been calculated using the CKD EPI equation. This calculation has not been validated in all clinical situations. eGFR's persistently <60  mL/min signify possible Chronic Kidney Disease.    Anion gap 15 5 - 15  Magnesium     Status: None   Collection Time: 01/14/17 12:57 PM  Result Value Ref Range   Magnesium 2.0 1.7 - 2.4 mg/dL  Phosphorus     Status: None   Collection Time: 01/14/17 12:57 PM  Result Value Ref Range   Phosphorus 3.5 2.5 - 4.6 mg/dL  CBC with Differential     Status: Abnormal   Collection Time: 01/14/17 12:57 PM  Result Value Ref Range   WBC 11.3 (H) 4.0 - 10.5 K/uL   RBC 4.70 4.22 - 5.81 MIL/uL   Hemoglobin 13.1 13.0 - 17.0 g/dL   HCT 41.7 39.0 - 52.0 %   MCV 88.7 78.0 - 100.0 fL   MCH 27.9 26.0 - 34.0 pg   MCHC 31.4 30.0 - 36.0 g/dL   RDW 13.5 11.5 - 15.5 %   Platelets 179 150 - 400 K/uL   Neutrophils Relative % 88 %   Neutro Abs 10.0 (H) 1.7 - 7.7 K/uL   Lymphocytes Relative 8 %   Lymphs Abs 0.8 0.7 - 4.0 K/uL   Monocytes Relative 4 %   Monocytes Absolute 0.4 0.1 - 1.0 K/uL   Eosinophils Relative 0 %   Eosinophils Absolute 0.0 0.0 - 0.7 K/uL   Basophils Relative 0 %   Basophils Absolute 0.0 0.0 - 0.1 K/uL  Acetaminophen level     Status: Abnormal   Collection Time: 01/14/17  1:03 PM  Result Value Ref Range   Acetaminophen (Tylenol), Serum <10 (L) 10 - 30 ug/mL    Comment:        THERAPEUTIC CONCENTRATIONS VARY SIGNIFICANTLY. A RANGE OF 10-30 ug/mL MAY BE AN EFFECTIVE CONCENTRATION FOR MANY PATIENTS. HOWEVER, SOME ARE BEST TREATED AT CONCENTRATIONS OUTSIDE THIS RANGE. ACETAMINOPHEN CONCENTRATIONS >150 ug/mL AT 4 HOURS AFTER INGESTION AND >50 ug/mL AT 12 HOURS AFTER INGESTION ARE OFTEN ASSOCIATED WITH TOXIC REACTIONS.   Ethanol     Status: None   Collection Time: 01/14/17  1:03 PM  Result Value Ref Range   Alcohol, Ethyl (B) <5 <5  mg/dL    Comment:        LOWEST DETECTABLE LIMIT FOR SERUM ALCOHOL IS 5 mg/dL FOR MEDICAL PURPOSES ONLY   Salicylate level     Status: None   Collection Time: 01/14/17  1:03 PM  Result Value Ref Range   Salicylate Lvl <7.3 2.8 - 30.0 mg/dL  Ammonia     Status: Abnormal   Collection Time: 01/14/17  1:06 PM  Result Value Ref Range   Ammonia 54 (H) 9 - 35 umol/L  Valproic acid level     Status: Abnormal   Collection Time: 01/14/17  1:10 PM  Result Value Ref Range   Valproic Acid Lvl 39 (L) 50.0 - 100.0 ug/mL  I-Stat Troponin, ED (not at [facility])     Status: None   Collection Time: 01/14/17  1:13 PM  Result Value Ref Range   Troponin i, poc 0.00 0.00 - 0.08 ng/mL   Comment 3            Comment: Due to the release kinetics of cTnI, a negative result within the first hours of the onset of symptoms does not rule out myocardial infarction with certainty. If myocardial infarction is still suspected, repeat the test at appropriate intervals.   I-Stat CG4 Lactic Acid, ED     Status: Abnormal   Collection Time: 01/14/17  1:15 PM  Result Value Ref Range   Lactic Acid, Venous 8.15 (HH) 0.5 - 1.9 mmol/L   Comment NOTIFIED PHYSICIAN   I-stat chem 8, ed     Status: Abnormal   Collection Time: 01/14/17  1:19 PM  Result Value Ref Range   Sodium 141 135 - 145 mmol/L   Potassium 3.6 3.5 - 5.1 mmol/L   Chloride 106 101 - 111 mmol/L   BUN 22 (H) 6 - 20 mg/dL   Creatinine, Ser 1.20 0.61 - 1.24 mg/dL   Glucose, Bld 189 (H) 65 - 99 mg/dL   Calcium, Ion 1.07 (L) 1.15 - 1.40 mmol/L   TCO2 22 0 - 100 mmol/L   Hemoglobin 13.9 13.0 - 17.0 g/dL   HCT 41.0 39.0 - 52.0 %  Urine rapid drug screen (hosp performed)not at [facility]     Status: Abnormal   Collection Time: 01/14/17  1:20 PM  Result Value Ref Range   Opiates NONE DETECTED NONE DETECTED   Cocaine NONE DETECTED NONE DETECTED   Benzodiazepines POSITIVE (A) NONE DETECTED   Amphetamines NONE DETECTED NONE DETECTED   Tetrahydrocannabinol NONE  DETECTED NONE DETECTED   Barbiturates NONE DETECTED NONE DETECTED    Comment:        DRUG SCREEN FOR MEDICAL PURPOSES ONLY.  IF CONFIRMATION IS NEEDED FOR ANY PURPOSE, NOTIFY LAB WITHIN 5 DAYS.        LOWEST DETECTABLE LIMITS FOR URINE DRUG SCREEN Drug Class       Cutoff (ng/mL) Amphetamine      1000 Barbiturate      200 Benzodiazepine   333 Tricyclics       545 Opiates          300 Cocaine          300 THC              50   Urinalysis, Routine w reflex microscopic     Status: Abnormal   Collection Time: 01/14/17  1:20 PM  Result Value Ref Range   Color, Urine YELLOW YELLOW   APPearance HAZY (A) CLEAR   Specific Gravity, Urine 1.020 1.005 - 1.030   pH 5.0 5.0 - 8.0   Glucose, UA NEGATIVE NEGATIVE mg/dL   Hgb urine dipstick NEGATIVE NEGATIVE   Bilirubin Urine NEGATIVE NEGATIVE   Ketones, ur NEGATIVE NEGATIVE mg/dL   Protein, ur 30 (A) NEGATIVE mg/dL   Nitrite NEGATIVE NEGATIVE   Leukocytes, UA NEGATIVE NEGATIVE   RBC / HPF 0-5 0 - 5 RBC/hpf   WBC, UA 0-5 0 - 5 WBC/hpf   Bacteria, UA RARE (A) NONE SEEN   Squamous Epithelial / LPF 0-5 (A) NONE SEEN   Mucous PRESENT    Hyaline Casts, UA PRESENT   I-Stat CG4 Lactic Acid, ED     Status: Abnormal   Collection Time: 01/14/17  3:06 PM  Result Value Ref Range   Lactic Acid, Venous 3.49 (HH) 0.5 - 1.9 mmol/L   Comment NOTIFIED PHYSICIAN     Ct Head Wo Contrast  Result Date: 01/14/2017 CLINICAL DATA:  Severe AMS pt, try to put strap around his head to do the scan, three unsuccessful attempt to get the scan. Emergency doctor asked to get the scan. EXAM: CT HEAD WITHOUT CONTRAST TECHNIQUE: Contiguous axial images were obtained from the base of the skull through the vertex without intravenous contrast. COMPARISON:  None. FINDINGS: Brain: Head CT is nondiagnostic due to severe patient motion artifact. Three separate scans were attempted, all nondiagnostic. Vascular: Cannot be  assessed. Skull: Markedly limited characterization due to  patient motion artifact. No obvious skull fracture or displacement. Sinuses/Orbits: Sinuses grossly clear. Other: None IMPRESSION: Nondiagnostic head CT due to severe patient motion artifact. Electronically Signed   By: Franki Cabot M.D.   On: 01/14/2017 15:16   Dg Chest Portable 1 View  Result Date: 01/14/2017 CLINICAL DATA:  Seizure like activity. EXAM: PORTABLE CHEST 1 VIEW COMPARISON:  None. FINDINGS: Heart size is normal. Grossly stable density overlying the region of the aortic arch, compatible with the suprahilar mass demonstrated on earlier chest CT of 09/07/2016. Heart size is upper normal, stable. Straightening of the left heart shadow, indicating associated airspace collapse. No acute or suspicious osseous finding. IMPRESSION: 1. 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 Suspect some associated airspace collapse in this region given the configuration of the heart and left-sided volume loss. 2. Right lung remains clear. Electronically Signed   By: Franki Cabot M.D.   On: 01/14/2017 13:36    ROS Blood pressure 101/79, pulse 90, temperature (!) 101 F (38.3 C), temperature source Rectal, resp. rate 16, height 6' (1.829 m), weight 77.1 kg (170 lb), SpO2 98 %. Neurologic Examination:  Confused, non-verbal, not following commands, agitated a bit.  Withdraws to pain in all 4 extremities. Eyes midline. Not following with eyes. No tongue bite. Voluntary non-rhythmic movements of the feet.    Assessment/Plan:  Patient with history of dementia, seizures, and lung cancer.  He does not appear to be having any GTCS or Focal seizures clinically, however, I cannot completely rule out non-convulsive status epilepticus.  Hence, I ordered another dose of IV VPA 250 mg.  I will order an EEG and if he does not turn around may need the EEG emergently.  Given possibility of metastatic lung cancer  to the brain I will order an MRI Brain with sedation to ensure no motion artifact if possible. Starting tomorrow, he can resume home regimen of Keppra XR 1500 mg bid, Vimpat 200 mg bid, and increased VPA of 1000 mg bid assuming he is more awake and alert to swallow the pills.    Rogue Jury, MD 01/14/2017, 5:58 PM

## 2017-01-14 NOTE — ED Notes (Signed)
Pt is moving all four extremities. Pt is pulling the blanket over himself

## 2017-01-14 NOTE — ED Notes (Signed)
MD at bedside. 

## 2017-01-14 NOTE — ED Triage Notes (Signed)
Called out by group home for being altered. No LSN.

## 2017-01-14 NOTE — ED Provider Notes (Signed)
[facility] DEPT Provider Note   CSN: [id number] Arrival date & time: 01/14/17  1245     History   Chief Complaint Chief Complaint  Patient presents with  . Seizures    HPI Marco [name] is a 66 y.o. male.  The history is provided by the EMS personnel.  Seizures   This is a chronic problem. The current episode started less than 1 hour ago. The problem has been gradually improving. There was 1 seizure. The most recent episode lasted 2 to 5 minutes. Characteristics include eye deviation, rhythmic jerking and loss of consciousness. Characteristics do not include eye blinking, bowel incontinence, bladder incontinence, bit tongue or cyanosis. The episode was witnessed. The seizures did not continue in the ED. Focality: diffuse. Unknown. EMS picked patient up at a group home with no medications and they had no information on patient.  Associated symptoms comments: History of severe MR, seizures, SCC on chemoradiation. Meds prior to arrival: 2.5 mg versed given by EMS.    No past medical history on file.  There are no active problems to display for this patient.   No past surgical history on file.     Home Medications    Prior to Admission medications   Not on File    Family History No family history on file.  Social History Social History  Substance Use Topics  . Smoking status: Not on file  . Smokeless tobacco: Not on file  . Alcohol use Not on file     Allergies   Patient has no allergy information on record.   Review of Systems Review of Systems  Unable to perform ROS: Acuity of condition  Cardiovascular: Negative for cyanosis.  Gastrointestinal: Negative for bowel incontinence.  Genitourinary: Negative for bladder incontinence.  Neurological: Positive for seizures and loss of consciousness.     Physical Exam Updated Vital Signs  ED Triage Vitals  Enc Vitals Group     BP 01/14/17 1249 101/74     Pulse Rate 01/14/17 1249 (!) 143     Resp  01/14/17 1249 (!) 30     Temp 01/14/17 1257 99.8 F (37.7 C)     Temp Source 01/14/17 1302 Rectal     SpO2 01/14/17 1247 100 %     Weight 01/14/17 1251 170 lb (77.1 kg)     Height 01/14/17 1251 6' (1.829 m)     Head Circumference --      Peak Flow --      Pain Score --      Pain Loc --      Pain Edu? --      Excl. in [place]? --     Physical Exam  Constitutional: He has a sickly appearance. He appears ill.  HENT:  Head: Normocephalic and atraumatic.  Eyes: EOM are normal. Pupils are equal, round, and reactive to light. Right eye exhibits normal extraocular motion. Left eye exhibits normal extraocular motion.  Neck: Normal range of motion. Neck supple.  No meningismus   Cardiovascular: Intact distal pulses and normal pulses.  Tachycardia present.   Pulmonary/Chest: Breath sounds normal. No respiratory distress. He has no wheezes.  Abdominal: Soft. He exhibits no distension.  Musculoskeletal: He exhibits no edema.  Neurological: He exhibits normal muscle tone.  Patient moves all extremities spontaneously, opens eye spontaneously, withdraws to pain, does not speak  Skin: Skin is warm. Capillary refill takes less than 2 seconds.     ED Treatments / Results  Labs (all labs ordered are  listed, but only abnormal results are displayed) Labs Reviewed  COMPREHENSIVE METABOLIC PANEL - Abnormal; Notable for the following:       Result Value   CO2 20 (*)    Glucose, Bld 195 (*)    Creatinine, Ser 1.47 (*)    ALT 10 (*)    GFR calc non Af Amer 47 (*)    GFR calc Af Amer 55 (*)    All other components within normal limits  CBC WITH DIFFERENTIAL/PLATELET - Abnormal; Notable for the following:    WBC 11.3 (*)    Neutro Abs 10.0 (*)    All other components within normal limits  ACETAMINOPHEN LEVEL - Abnormal; Notable for the following:    Acetaminophen (Tylenol), Serum <10 (*)    All other components within normal limits  RAPID URINE DRUG SCREEN, HOSP PERFORMED - Abnormal; Notable for  the following:    Benzodiazepines POSITIVE (*)    All other components within normal limits  AMMONIA - Abnormal; Notable for the following:    Ammonia 54 (*)    All other components within normal limits  URINALYSIS, ROUTINE W REFLEX MICROSCOPIC - Abnormal; Notable for the following:    APPearance HAZY (*)    Protein, ur 30 (*)    Bacteria, UA RARE (*)    Squamous Epithelial / LPF 0-5 (*)    All other components within normal limits  VALPROIC ACID LEVEL - Abnormal; Notable for the following:    Valproic Acid Lvl 39 (*)    All other components within normal limits  CBG MONITORING, ED - Abnormal; Notable for the following:    Glucose-Capillary 187 (*)    All other components within normal limits  I-STAT CG4 LACTIC ACID, ED - Abnormal; Notable for the following:    Lactic Acid, Venous 8.15 (*)    All other components within normal limits  I-STAT CHEM 8, ED - Abnormal; Notable for the following:    BUN 22 (*)    Glucose, Bld 189 (*)    Calcium, Ion 1.07 (*)    All other components within normal limits  I-STAT CG4 LACTIC ACID, ED - Abnormal; Notable for the following:    Lactic Acid, Venous 3.49 (*)    All other components within normal limits  CULTURE, BLOOD (ROUTINE X 2)  CULTURE, BLOOD (ROUTINE X 2)  URINE CULTURE  MAGNESIUM  PHOSPHORUS  ETHANOL  SALICYLATE LEVEL  I-STAT TROPOININ, ED  I-STAT CG4 LACTIC ACID, ED    EKG  EKG Interpretation None       Radiology Ct Head Wo Contrast  Result Date: 01/14/2017 CLINICAL DATA:  Severe AMS pt, try to put strap around his head to do the scan, three unsuccessful attempt to get the scan. Emergency doctor asked to get the scan. EXAM: CT HEAD WITHOUT CONTRAST TECHNIQUE: Contiguous axial images were obtained from the base of the skull through the vertex without intravenous contrast. COMPARISON:  None. FINDINGS: Brain: Head CT is nondiagnostic due to severe patient motion artifact. Three separate scans were attempted, all nondiagnostic.  Vascular: Cannot be assessed. Skull: Markedly limited characterization due to patient motion artifact. No obvious skull fracture or displacement. Sinuses/Orbits: Sinuses grossly clear. Other: None IMPRESSION: Nondiagnostic head CT due to severe patient motion artifact. Electronically Signed   By: Franki Cabot M.D.   On: 01/14/2017 15:16   Dg Chest Portable 1 View  Result Date: 01/14/2017 CLINICAL DATA:  Seizure like activity. EXAM: PORTABLE CHEST 1 VIEW COMPARISON:  None. FINDINGS: Heart  size is normal. Grossly stable density overlying the region of the aortic arch, compatible with the suprahilar mass demonstrated on earlier chest CT of 09/07/2016. Heart size is upper normal, stable. Straightening of the left heart shadow, indicating associated airspace collapse. No acute or suspicious osseous finding. IMPRESSION: 1. 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 Suspect some associated airspace collapse in this region given the configuration of the heart and left-sided volume loss. 2. Right lung remains clear. Electronically Signed   By: Franki Cabot M.D.   On: 01/14/2017 13:36    Procedures Procedures (including critical care time)  Medications Ordered in ED Medications  vancomycin (VANCOCIN) 1,500 mg in sodium chloride 0.9 % 500 mL IVPB (1,500 mg Intravenous New Bag/Given 01/14/17 1404)  piperacillin-tazobactam (ZOSYN) IVPB 4.5 g (not administered)  fosPHENYtoin (CEREBYX) 1,250 mg PE in sodium chloride 0.9 % 50 mL IVPB (0 mg PE Intravenous Stopped 01/14/17 1336)  sodium chloride 0.9 % bolus 1,000 mL (0 mLs Intravenous Stopped 01/14/17 1442)  levETIRAcetam (KEPPRA) 1,500 mg in sodium chloride 0.9 % 100 mL IVPB (1,500 mg Intravenous New Bag/Given 01/14/17 1500)  sodium chloride 0.9 % bolus 1,000 mL (0 mLs Intravenous Stopped 01/14/17 1442)     Initial Impression / Assessment and Plan / ED  Course  I have reviewed the triage vital signs and the nursing notes.  Pertinent labs & imaging results that were available during my care of the patient were reviewed by me and considered in my medical decision making (see chart for details).     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 Patient's vitals at time of arrival to the ER are significant for fever but otherwise are unremarkable. Patient with versed given by EMS for seizure activity on scene. Patient was staying at a group home and EMS was unable to provide medical history or medications as staff there was unaware of patient. Patient arrived as a Jenny Reichmann Doe and eventually was able to discover from his medical chart that he was recently discharged 2 weeks ago for seziure and supposed be on depakote and keppra. I was able to contact caregiver for further information as well. On arrival, patient moving all extremities on his own and spontaneously opens eyes, patient unable to talk or follow commands. Patient with no eye deviation and improving tachycardia and does not appear to currently be having seizure-like activity. Pt given fosphenytoin prior to knowledge of home medications and after finding out his home medications patient was given Depakote and Keppra. Patient found to have a fever and given concern for possible sepsis, patient was given 30 mL/kg of fluids after he did have elevated lactic acid to 8.15. However believe lactic acidosis is from seizure. Patient normotensive. Given the patient may be immunocompromised we will give IV Zosyn and vancomycin to empirically cover as well. Blood cultures collected. Labs ordered including urinalysis, head CT, Depakote level.  Patient found to have mild leukocytosis and acute kidney injury likely from dehydration. Patient otherwise with  unremarkable labs included electrolytes. Patient with no signs of pneumonia on chest x-ray and urinalysis negative for urinary tract infection. Blood cx pending Patient with depakote level below normal limits and likely patient with noncompliance or breakthrough seizures. Possible mets. Patient with no signs to suggest  meningitis on exam. Patient moving neck and all extremities. I was able to contact primary caregiver who states that patient after having seizures usually has prolonged post-ictal phase including after receiving benzodiazepines. He states the patient usually takes about a day to fully recover from seizures. He states that his normal baseline activity is that he is able to take part in some conversation. He states that after seizures he does become agitated and tries to pull IVs and that usually putting on mittens will help. Patient had head CT that had motion artifact but no signs of acute hemorrhage. Patient likely with lactic acidosis and fever from seizure like activity. Patient with tachycardia resolved following versed and fluids. Patient never hypotensive. Patient continues to improve from a mental status standpoint but still not at baseline. Patient to be admitted to hospitalist service.   Final Clinical Impressions(s) / ED Diagnoses   Final diagnoses:  Seizure ([place])  Fever in adult  Lactic acidosis  Acute kidney injury [facility])    New Prescriptions New Prescriptions   No medications on file     Lennice Sites, DO 01/14/17 4627    Veryl Speak, MD 01/15/17 1116

## 2017-01-14 NOTE — ED Notes (Signed)
I Stat Lactic Acid results shown to Dr. Lennice Sites.

## 2017-01-14 NOTE — Progress Notes (Signed)
Pt arrived to 5M18 via stretcher. Pt alert.  VSS. Telemetry applied and CCMD notified.  Will continue to monitor.  Cori Razor, RN

## 2017-01-14 NOTE — H&P (Signed)
[facility] History and Physical  Marco [name] UKG:[id number] DOB: 06-07-1951 DOA: 01/14/2017  [name] - [phone] - marked for merege  Referring physician:  PCP: Leamon Arnt, MD   Chief Complaint: seizure  HPI: Marco [name] is a 66 y.o. male  medical history significant of severe mental retardation, seizure disorder followed by Dr. Sarina Ill, SCC of L. Lung on chemotherapy followed by Dr. Earlie Server; patient had a seizure at his group home. EMS was activated. They give the patient Versed en route which stopped his tonic-clonic seizure. Patient was then brought to the emergency room for evaluation.  No history can be gathered from patient due to his severe mental retardation.   Review of Systems:  As per HPI otherwise 10 point review of systems negative.    PMHx: Dementia, depression, GERD, radiation therapy, hypertension, lung cancer, seizure disorder  PSHx: Video bronchoscopy, eye surgery, cataract extraction  Social History: Former smoker  Not on File  Family history: Hypertension-mother, hypertension-father  Prior to Admission medications   Not on File   Physical Exam: Vitals:   01/14/17 1405 01/14/17 1415 01/14/17 1451 01/14/17 1455  BP: 101/68 101/63 96/72 105/82  Pulse: 95 97 91 (!) 104  Resp: 16 (!) 21 (!) 23 (!) 22  Temp:      TempSrc:      SpO2: 96% 95% 98% 97%  Weight:      Height:        Wt Readings from Last 3 Encounters:  01/14/17 77.1 kg (170 lb)    General:  Appears calm and comfortable Eyes:  PERRL, normal lids, iris ENT:  grossly normal hearing, lips & tongue Neck:  no LAD, masses or thyromegaly Cardiovascular:  Regular rhythm, tachycardia, no m/r/g. No LE edema.  Respiratory:  CTA bilaterally, no w/r/r. Tachypnea. Abdomen:  soft, ntnd Skin:  no rash or induration seen on limited exam Musculoskeletal:  grossly normal tone BUE/BLE Psychiatric:  grossly normal mood and affect, Nonverbal currently Neurologic:  CN  unable to fully assess, moves all extremities in coordinated fashion.Muscle fasciculations.           Labs on Admission:  Basic Metabolic Panel:  Recent Labs Lab 01/14/17 1257 01/14/17 1319  NA 139 141  K 3.6 3.6  CL 104 106  CO2 20*  --   GLUCOSE 195* 189*  BUN 18 22*  CREATININE 1.47* 1.20  CALCIUM 9.1  --   MG 2.0  --   PHOS 3.5  --    Liver Function Tests:  Recent Labs Lab 01/14/17 1257  AST 21  ALT 10*  ALKPHOS 64  BILITOT 0.3  PROT 7.5  ALBUMIN 3.6   No results for input(s): LIPASE, AMYLASE in the last 168 hours.  Recent Labs Lab 01/14/17 1306  AMMONIA 54*   CBC:  Recent Labs Lab 01/14/17 1257 01/14/17 1319  WBC 11.3*  --   NEUTROABS 10.0*  --   HGB 13.1 13.9  HCT 41.7 41.0  MCV 88.7  --   PLT 179  --    Cardiac Enzymes: No results for input(s): CKTOTAL, CKMB, CKMBINDEX, TROPONINI in the last 168 hours.  BNP (last 3 results) No results for input(s): BNP in the last 8760 hours.  ProBNP (last 3 results) No results for input(s): PROBNP in the last 8760 hours.   Serum creatinine: 1.2 mg/dL 01/14/17 1319 Estimated creatinine clearance: 64.3 mL/min  CBG:  Recent Labs Lab 01/14/17 1254  GLUCAP 187*    Radiological Exams on Admission:  Ct Head Wo Contrast  Result Date: 01/14/2017 CLINICAL DATA:  Severe AMS pt, try to put strap around his head to do the scan, three unsuccessful attempt to get the scan. Emergency doctor asked to get the scan. EXAM: CT HEAD WITHOUT CONTRAST TECHNIQUE: Contiguous axial images were obtained from the base of the skull through the vertex without intravenous contrast. COMPARISON:  None. FINDINGS: Brain: Head CT is nondiagnostic due to severe patient motion artifact. Three separate scans were attempted, all nondiagnostic. Vascular: Cannot be assessed. Skull: Markedly limited characterization due to patient motion artifact. No obvious skull fracture or displacement. Sinuses/Orbits: Sinuses grossly clear. Other: None  IMPRESSION: Nondiagnostic head CT due to severe patient motion artifact. Electronically Signed   By: Franki Cabot M.D.   On: 01/14/2017 15:16   Dg Chest Portable 1 View  Result Date: 01/14/2017 CLINICAL DATA:  Seizure like activity. EXAM: PORTABLE CHEST 1 VIEW COMPARISON:  None. FINDINGS: Heart size is normal. Grossly stable density overlying the region of the aortic arch, compatible with the suprahilar mass demonstrated on earlier chest CT of 09/07/2016. Heart size is upper normal, stable. Straightening of the left heart shadow, indicating associated airspace collapse. No acute or suspicious osseous finding. IMPRESSION: 1. 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 Suspect some associated airspace collapse in this region given the configuration of the heart and left-sided volume loss. 2. Right lung remains clear. Electronically Signed   By: Franki Cabot M.D.   On: 01/14/2017 13:36    EKG: Independently reviewed. Sinus tach,no stemi.  Assessment/Plan Active Problems:   Seizure ([place])  Recurrent seizures with H/O Seizure disorder: Acute.  - Admit to a telemetry bed  - Seizure order set initiated - Seizure precautions and close monitoring for seizure-like activity - NPO, until able to pass a bedside swallow - Continue giving Keppra, Depakote and fosfomycin in the ED -Neurology was consulted and is following -EEG planned for tomorrow Patient's valproic acid level was found to be low and is the likely cause of his seizure Patient was loaded with fosphenytoin and Keppra and emergency room  Fever and lactic acid Patient had a full tonic-clonic seizure prior to arrival Lactic acid was noted to be elevated as well as body temperature These are both likely due to patient having a seizure. Patient was started on sepsis protocol by the EDP Will observe patient while on antibiotics for the  next 24 hours  Renal insufficiency: Acute. Baseline creatinine previously noted between 0.8-1. Patient presents with elevated creatinine of 1.2 on admission. - Gentle IV fluids overnight - Recheck BMP in a.m.  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  S/p concurrent chemoradiation currently on Nivolumab 240 MG IV every 2 weeks(last infusion was 4/19). - Continue outpatient f/u with oncology  Severe Mental retardation And h/o dementia - Set bed alarm on - Sitter to bedside  GERD - Protonix IV while NPO   Code Status: FULL  DVT Prophylaxis: heparin Family Communication: none Disposition Plan: Pending Improvement  Status: tele, obs  Elwin Mocha, MD Family Medicine [facility] [email or web address] Password [email or web address]

## 2017-01-14 NOTE — ED Notes (Signed)
Caregiver  [name]  [phone]

## 2017-01-14 NOTE — ED Triage Notes (Signed)
Per [facility], pt from group home, states hx of seizures. Pt normally has seizures with a gaze, this time had tonic clonic seizure. Ems gave 2.5 versed which stopped all seizure activity. Pt has nasal trumpet in place. VSS.

## 2017-01-14 NOTE — ED Notes (Signed)
See notes

## 2017-01-14 NOTE — ED Notes (Signed)
Attempted report 

## 2017-01-14 NOTE — ED Notes (Signed)
Pt now moving R arm and leg.

## 2017-01-14 NOTE — ED Notes (Signed)
Found the pt sitting on the trauma stretcher with both legs dangling over the side rails.   Pt removed from trauma  Stretcher with low rails  And placed on a regular stretcher with higher  Side rails seizure pads replaced stretcher in the lowest position  Primary nurse notified and the charge  Nurse.  pts bed was wet  The pt is now calmer and dryer

## 2017-01-14 NOTE — ED Notes (Signed)
Pt hands placed in mittens. Attemped x3 for head CT, unsuccessful

## 2017-01-15 ENCOUNTER — Observation Stay (HOSPITAL_COMMUNITY): Payer: Medicare Other

## 2017-01-15 ENCOUNTER — Observation Stay (HOSPITAL_BASED_OUTPATIENT_CLINIC_OR_DEPARTMENT_OTHER)
Admit: 2017-01-15 | Discharge: 2017-01-15 | Disposition: A | Discharge status: Home / Self Care | Payer: Medicare Other | Attending: Neurology | Admitting: Neurology

## 2017-01-15 DIAGNOSIS — E872 Acidosis: Secondary | ICD-10-CM | POA: Diagnosis not present

## 2017-01-15 DIAGNOSIS — K219 Gastro-esophageal reflux disease without esophagitis: Secondary | ICD-10-CM | POA: Diagnosis present

## 2017-01-15 DIAGNOSIS — N289 Disorder of kidney and ureter, unspecified: Secondary | ICD-10-CM | POA: Diagnosis present

## 2017-01-15 DIAGNOSIS — N179 Acute kidney failure, unspecified: Secondary | ICD-10-CM | POA: Diagnosis not present

## 2017-01-15 DIAGNOSIS — R569 Unspecified convulsions: Secondary | ICD-10-CM | POA: Diagnosis present

## 2017-01-15 DIAGNOSIS — Z87891 Personal history of nicotine dependence: Secondary | ICD-10-CM | POA: Diagnosis not present

## 2017-01-15 DIAGNOSIS — C3492 Malignant neoplasm of unspecified part of left bronchus or lung: Secondary | ICD-10-CM | POA: Diagnosis present

## 2017-01-15 DIAGNOSIS — Z8249 Family history of ischemic heart disease and other diseases of the circulatory system: Secondary | ICD-10-CM | POA: Diagnosis not present

## 2017-01-15 DIAGNOSIS — F0390 Unspecified dementia without behavioral disturbance: Secondary | ICD-10-CM | POA: Diagnosis present

## 2017-01-15 DIAGNOSIS — F72 Severe intellectual disabilities: Secondary | ICD-10-CM | POA: Diagnosis present

## 2017-01-15 DIAGNOSIS — G40909 Epilepsy, unspecified, not intractable, without status epilepticus: Secondary | ICD-10-CM | POA: Insufficient documentation

## 2017-01-15 DIAGNOSIS — I10 Essential (primary) hypertension: Secondary | ICD-10-CM | POA: Diagnosis present

## 2017-01-15 DIAGNOSIS — Z79899 Other long term (current) drug therapy: Secondary | ICD-10-CM | POA: Diagnosis not present

## 2017-01-15 DIAGNOSIS — R509 Fever, unspecified: Secondary | ICD-10-CM | POA: Diagnosis present

## 2017-01-15 LAB — URINE CULTURE: Culture: NO GROWTH

## 2017-01-15 LAB — AMMONIA: Ammonia: 16 umol/L (ref 9–35)

## 2017-01-15 MED ORDER — VALPROATE SODIUM 500 MG/5ML IV SOLN
750.0000 mg | Freq: Two times a day (BID) | INTRAVENOUS | Status: DC
  Administered 2017-01-15 – 2017-01-17 (×5): 750 mg via INTRAVENOUS
  Filled 2017-01-15 (×6): qty 7.5

## 2017-01-15 MED ORDER — LEVETIRACETAM 500 MG/5ML IV SOLN
1000.0000 mg | Freq: Two times a day (BID) | INTRAVENOUS | Status: DC
  Administered 2017-01-15 – 2017-01-17 (×5): 1000 mg via INTRAVENOUS
  Filled 2017-01-15 (×6): qty 10

## 2017-01-15 MED ORDER — DIVALPROEX SODIUM ER 500 MG PO TB24: 750.0000 mg | ORAL_TABLET | Freq: Two times a day (BID) | ORAL | Status: DC

## 2017-01-15 MED ORDER — VALPROATE SODIUM 500 MG/5ML IV SOLN
500.0000 mg | Freq: Once | INTRAVENOUS | Status: AC
  Administered 2017-01-15: 500 mg via INTRAVENOUS
  Filled 2017-01-15: qty 5

## 2017-01-15 MED ORDER — SODIUM CHLORIDE 0.9 % IV SOLN
200.0000 mg | Freq: Two times a day (BID) | INTRAVENOUS | Status: DC
  Administered 2017-01-15 – 2017-01-17 (×4): 200 mg via INTRAVENOUS
  Filled 2017-01-15 (×10): qty 20

## 2017-01-15 MED ORDER — LACOSAMIDE 200 MG PO TABS: 200.0000 mg | ORAL_TABLET | Freq: Two times a day (BID) | ORAL | Status: DC

## 2017-01-15 MED ORDER — LEVETIRACETAM ER 500 MG PO TB24: 1500.0000 mg | ORAL_TABLET | Freq: Two times a day (BID) | ORAL | Status: DC

## 2017-01-15 MED ORDER — LORAZEPAM 2 MG/ML IJ SOLN
1.0000 mg | Freq: Once | INTRAMUSCULAR | Status: AC
  Administered 2017-01-15: 1 mg via INTRAVENOUS
  Filled 2017-01-15 (×2): qty 1

## 2017-01-15 MED ORDER — DIVALPROEX SODIUM 500 MG PO DR TAB: 750.0000 mg | DELAYED_RELEASE_TABLET | Freq: Two times a day (BID) | ORAL | Status: DC

## 2017-01-15 NOTE — Progress Notes (Signed)
NT pressed duress button. This RN, Nira Conn, RN, Renaldo Reel, RN came to room. NT states patient was choking on food. On arrival to room, patient was coughing, attempting to spit out food from mouth.  Patient's SaO2 100% on room air, mouth suctioned, sat up to side of bed. Dr. Eliseo Squires notified. NT will give very small, slow bites of food. Will notify Garnett Farm, RN.

## 2017-01-15 NOTE — Progress Notes (Signed)
Bedside EEG completed, results pending.  Difficult application, pt resisting, much movement.

## 2017-01-15 NOTE — Procedures (Signed)
HPI:  66 y/o with hx of seizure presents to hospital with seizure disorder  TECHNICAL SUMMARY:  A multichannel referential and bipolar montage EEG using the standard international 10-20 system was performed on the patient described as uncooperative and confused.  Most of the background activity through much of the tracing is filled with artifact due to the patient moving and attempting to sit up during the recording.  During the brief portion of the tracing in which the patient is not active, there is a 9 Hz occipital dominant rhythm.    Low voltage fast (beta) activity is distributed symmetrically and maximally over the anterior head regions.  ACTIVATION:  Stepwise photic stimulation and hyperventilation was not performed  EPILEPTIFORM ACTIVITY:  There were no obvious spikes, sharp waves, or paroxysmal activity.  As above, there was significant myogenic artifact throughout the tracing due to the patient's uncooperative nature.  SLEEP:  No sleep  IMPRESSION:  This EEG demonstrated no focal, hemispheric, or lateralizing features.  The interpretation is very limited by significant myogenic artifact throughout the tracing due to the fact that the patient was uncooperative.  There was no obvious epileptiform activity on the tracing.  Correlate clinically.

## 2017-01-15 NOTE — Progress Notes (Signed)
PROGRESS NOTE    Marco [name]  VOJ:[id number] DOB: Jun 28, 1951 DOA: 01/14/2017 PCP: Leamon Arnt, MD   Outpatient Specialists:     Brief Narrative:  Marco [name] is a 66 y.o. male  medical history significant of severe mental retardation, seizure disorder followed by Dr. Sarina Ill, [facility]. Lung on chemotherapy followed by Dr. Earlie Server; patient had a seizure at his group home. EMS was activated. They give the patient Versed en route which stopped his tonic-clonic seizure. Patient was then brought to the emergency room for evaluation.   Assessment & Plan:   Active Problems:   Seizure ([place])   Recurrent seizures with H/O Seizure disorder: Acute.  - Seizure precautions and close monitoring for seizure-like activity -DYS diet - resume home meds -Neurology was consulted and is following MRI ordered with sedation by neurology  Fever and lactic acid Patient had a full tonic-clonic seizure prior to arrival Lactic acid was noted to be elevated as well as body temperature These are both likely due to patient having a seizure. Given vanc/zosyn x 1 -watch for signs of aspiration PNA  Renal insufficiency: Acute. Baseline creatinine previously noted between 0.8-1. Patient presents with elevated creatinine of 1.2 on admission. - monitor  Squamous cell carcinoma of the left lung: Stage IIa diagnosis in January 2015. S/p concurrent chemoradiation currently on Nivolumab 240 MG IV every 2 weeks(last infusion was 4/19). - Continue outpatient f/u with oncology  Severe Mental retardation And h/o dementia - Set bed alarm on - Sitter to bedside  GERD - Protonix   DVT prophylaxis:  SQ Heparin  Code Status: Full Code   Family Communication: Caregiver at bedside  Disposition Plan:     Consultants:   neuro     Subjective: No further seizure like activity  Objective: Vitals:   01/14/17 2205 01/15/17 0235 01/15/17 0541 01/15/17 0901  BP: (!) 100/57 93/61  (!) 88/55 95/72  Pulse: 76 80 79 61  Resp: '18 18 18 20  '$ Temp: 97.9 F (36.6 C)  97.4 F (36.3 C) 97.5 F (36.4 C)  TempSrc: Axillary  Oral Oral  SpO2: 93% 100% 100% 100%  Weight:      Height:        Intake/Output Summary (Last 24 hours) at 01/15/17 1329 Last data filed at 01/14/17 2207  Gross per 24 hour  Intake             2690 ml  Output              350 ml  Net             2340 ml   Filed Weights   01/14/17 1251  Weight: 77.1 kg (170 lb)    Examination:  General exam: Appears calm and comfortable  Respiratory system: Clear to auscultation. Respiratory effort normal. Cardiovascular system: S1 & S2 heard, RRR. No JVD, murmurs, rubs, gallops or clicks. No pedal edema. Gastrointestinal system: Abdomen is nondistended, soft and nontender. No organomegaly or masses felt. Normal bowel sounds heard. Central nervous system: No focal neurological deficits.      Data Reviewed: I have personally reviewed following labs and imaging studies  CBC:  Recent Labs Lab 01/14/17 1257 01/14/17 1319  WBC 11.3*  --   NEUTROABS 10.0*  --   HGB 13.1 13.9  HCT 41.7 41.0  MCV 88.7  --   PLT 179  --    Basic Metabolic Panel:  Recent Labs Lab 01/14/17 1257 01/14/17 1319  NA  139 141  K 3.6 3.6  CL 104 106  CO2 20*  --   GLUCOSE 195* 189*  BUN 18 22*  CREATININE 1.47* 1.20  CALCIUM 9.1  --   MG 2.0  --   PHOS 3.5  --    GFR: Estimated Creatinine Clearance: 64.3 mL/min (by C-G formula based on SCr of 1.2 mg/dL). Liver Function Tests:  Recent Labs Lab 01/14/17 1257  AST 21  ALT 10*  ALKPHOS 64  BILITOT 0.3  PROT 7.5  ALBUMIN 3.6   No results for input(s): LIPASE, AMYLASE in the last 168 hours.  Recent Labs Lab 01/14/17 1306 01/15/17 0527  AMMONIA 54* 16   Coagulation Profile: No results for input(s): INR, PROTIME in the last 168 hours. Cardiac Enzymes: No results for input(s): CKTOTAL, CKMB, CKMBINDEX, TROPONINI in the last 168 hours. BNP (last 3  results) No results for input(s): PROBNP in the last 8760 hours. HbA1C: No results for input(s): HGBA1C in the last 72 hours. CBG:  Recent Labs Lab 01/14/17 1254  GLUCAP 187*   Lipid Profile: No results for input(s): CHOL, HDL, LDLCALC, TRIG, CHOLHDL, LDLDIRECT in the last 72 hours. Thyroid Function Tests: No results for input(s): TSH, T4TOTAL, FREET4, T3FREE, THYROIDAB in the last 72 hours. Anemia Panel: No results for input(s): VITAMINB12, FOLATE, FERRITIN, TIBC, IRON, RETICCTPCT in the last 72 hours. Urine analysis:    Component Value Date/Time   COLORURINE YELLOW 01/14/2017 1320   APPEARANCEUR HAZY (A) 01/14/2017 1320   LABSPEC 1.020 01/14/2017 1320   PHURINE 5.0 01/14/2017 1320   GLUCOSEU NEGATIVE 01/14/2017 1320   HGBUR NEGATIVE 01/14/2017 1320   BILIRUBINUR NEGATIVE 01/14/2017 1320   KETONESUR NEGATIVE 01/14/2017 1320   PROTEINUR 30 (A) 01/14/2017 1320   NITRITE NEGATIVE 01/14/2017 1320   LEUKOCYTESUR NEGATIVE 01/14/2017 1320     ) Recent Results (from the past 240 hour(s))  Urine culture     Status: None   Collection Time: 01/14/17  1:20 PM  Result Value Ref Range Status   Specimen Description URINE, CATHETERIZED  Final   Special Requests NONE  Final   Culture NO GROWTH  Final   Report Status 01/15/2017 FINAL  Final  Blood culture (routine x 2)     Status: None (Preliminary result)   Collection Time: 01/14/17  1:27 PM  Result Value Ref Range Status   Specimen Description BLOOD LEFT HAND  Final   Special Requests IN PEDIATRIC BOTTLE Blood Culture adequate volume  Final   Culture NO GROWTH < 24 HOURS  Final   Report Status PENDING  Incomplete  Blood culture (routine x 2)     Status: None (Preliminary result)   Collection Time: 01/14/17  1:37 PM  Result Value Ref Range Status   Specimen Description BLOOD RIGHT ANTECUBITAL  Final   Special Requests   Final    BOTTLES DRAWN AEROBIC AND ANAEROBIC Blood Culture adequate volume   Culture NO GROWTH < 24 HOURS   Final   Report Status PENDING  Incomplete      Anti-infectives    Start     Dose/Rate Route Frequency Ordered Stop   01/14/17 1330  vancomycin (VANCOCIN) 1,500 mg in sodium chloride 0.9 % 500 mL IVPB     1,500 mg 250 mL/hr over 120 Minutes Intravenous  Once 01/14/17 1321 01/14/17 1604   01/14/17 1330  piperacillin-tazobactam (ZOSYN) IVPB 4.5 g     4.5 g 200 mL/hr over 30 Minutes Intravenous  Once 01/14/17 1321 01/14/17 1651  Radiology Studies: Ct Head Wo Contrast  Result Date: 01/14/2017 CLINICAL DATA:  Severe AMS pt, try to put strap around his head to do the scan, three unsuccessful attempt to get the scan. Emergency doctor asked to get the scan. EXAM: CT HEAD WITHOUT CONTRAST TECHNIQUE: Contiguous axial images were obtained from the base of the skull through the vertex without intravenous contrast. COMPARISON:  None. FINDINGS: Brain: Head CT is nondiagnostic due to severe patient motion artifact. Three separate scans were attempted, all nondiagnostic. Vascular: Cannot be assessed. Skull: Markedly limited characterization due to patient motion artifact. No obvious skull fracture or displacement. Sinuses/Orbits: Sinuses grossly clear. Other: None IMPRESSION: Nondiagnostic head CT due to severe patient motion artifact. Electronically Signed   By: Franki Cabot M.D.   On: 01/14/2017 15:16   Dg Chest Portable 1 View  Result Date: 01/14/2017 CLINICAL DATA:  Seizure like activity. EXAM: PORTABLE CHEST 1 VIEW COMPARISON:  None. FINDINGS: Heart size is normal. Grossly stable density overlying the region of the aortic arch, compatible with the suprahilar mass demonstrated on earlier chest CT of 09/07/2016. Heart size is upper normal, stable. Straightening of the left heart shadow, indicating associated airspace collapse. No acute or suspicious osseous finding. IMPRESSION: 1. Grossly stable density overlying the region of the aortic arch, compatible with the left suprahilar mass demonstrated on  earlier chest CT of 09/07/2016. Suspect some associated airspace collapse in this region given the configuration of the heart and left-sided volume loss. 2. Right lung remains clear. Electronically Signed   By: Franki Cabot M.D.   On: 01/14/2017 13:36   Dg Swallowing Func-speech Pathology  Result Date: 01/15/2017 Objective Swallowing Evaluation: Type of Study: Bedside Swallow Evaluation Patient Details Name: Marco [name] MRN: [id number] Date of Birth: 1951/09/03 Today's Date: 01/15/2017 Time: SLP Start Time (ACUTE ONLY): 1050-SLP Stop Time (ACUTE ONLY): 1110 SLP Time Calculation (min) (ACUTE ONLY): 20 min Past Medical History: No past medical history on file. Past Surgical History: No past surgical history on file. HPI: Marco Pooley Watkinsis a 66 y.o.malemedical history significant of severe intellectual disability, seizure disorder, SCC of L. Lung on chemotherapy followed by Dr. Earlie Server admitted after having a seizure at his group home. CT attempted resulting in nondiagnostic head CT due to severe patient motion artifact.  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 Suspect some associated airspace collapse in this region given the configuration of the heart and left-sided volume loss, right lung remains clear. No Data Recorded Assessment / Plan / Recommendation CHL IP CLINICAL IMPRESSIONS 01/15/2017 Clinical Impression Pt exhibits mild oral and pharyngeal dysphagia marked by decreased epiglottic deflection and laryngeal closure. Laryngeal penetration (reflexive cough/throat clear majority of time) with thin and nectar intermittently (including flash, to vocal cords, subsequent swallows cleared). Pt with frequent eructation at bedside and during MBS with intermittently tight UES and slower cervical esophageal transit. Vallecular and pyriform sinus residue reduced with spontaneous swallows. Pt is  very impulsive and is unable to modify sip size despite max verbal cues. There was no significant difference between thin and nectar re; penetration. No pna on CXR and pt admitted for seizure. Recommend Dys 3 texture (mechanical soft), thin liquids, no straws, tactile cues (remove cup from pt when needed), full supervision and crush meds.    SLP Visit Diagnosis Dysphagia, unspecified (R13.10) Attention and concentration deficit following -- Frontal lobe and executive function deficit following -- Impact on  safety and function Moderate aspiration risk   CHL IP TREATMENT RECOMMENDATION 01/15/2017 Treatment Recommendations Therapy as outlined in treatment plan below   Prognosis 01/15/2017 Prognosis for Safe Diet Advancement Fair Barriers to Reach Goals Cognitive deficits Barriers/Prognosis Comment -- CHL IP DIET RECOMMENDATION 01/15/2017 SLP Diet Recommendations Dysphagia 3 (Mech soft) solids;Thin liquid Liquid Administration via Cup;No straw Medication Administration Crushed with puree Compensations Minimize environmental distractions;Slow rate;Small sips/bites Postural Changes Remain semi-upright after after feeds/meals (Comment)   CHL IP OTHER RECOMMENDATIONS 01/15/2017 Recommended Consults -- Oral Care Recommendations Oral care BID Other Recommendations --   CHL IP FOLLOW UP RECOMMENDATIONS 01/15/2017 Follow up Recommendations None   CHL IP FREQUENCY AND DURATION 01/15/2017 Speech Therapy Frequency (ACUTE ONLY) min 2x/week Treatment Duration 2 weeks      CHL IP ORAL PHASE 01/15/2017 Oral Phase Impaired Oral - Pudding Teaspoon -- Oral - Pudding Cup -- Oral - Honey Teaspoon -- Oral - Honey Cup -- Oral - Nectar Teaspoon -- Oral - Nectar Cup WFL Oral - Nectar Straw -- Oral - Thin Teaspoon -- Oral - Thin Cup WFL Oral - Thin Straw -- Oral - Puree -- Oral - Mech Soft -- Oral - Regular Delayed oral transit Oral - Multi-Consistency -- Oral - Pill -- Oral Phase - Comment --  CHL IP PHARYNGEAL PHASE 01/15/2017 Pharyngeal Phase  Impaired Pharyngeal- Pudding Teaspoon -- Pharyngeal -- Pharyngeal- Pudding Cup -- Pharyngeal -- Pharyngeal- Honey Teaspoon -- Pharyngeal -- Pharyngeal- Honey Cup -- Pharyngeal -- Pharyngeal- Nectar Teaspoon -- Pharyngeal -- Pharyngeal- Nectar Cup Pharyngeal residue - valleculae;Pharyngeal residue - pyriform;Reduced epiglottic inversion Pharyngeal -- Pharyngeal- Nectar Straw -- Pharyngeal -- Pharyngeal- Thin Teaspoon -- Pharyngeal -- Pharyngeal- Thin Cup Penetration/Aspiration during swallow;Pharyngeal residue - valleculae;Pharyngeal residue - pyriform;Reduced epiglottic inversion Pharyngeal Material enters airway, remains ABOVE vocal cords then ejected out;Material enters airway, CONTACTS cords and then ejected out;Material enters airway, remains ABOVE vocal cords and not ejected out Pharyngeal- Thin Straw Penetration/Aspiration during swallow Pharyngeal Material enters airway, remains ABOVE vocal cords then ejected out;Material enters airway, remains ABOVE vocal cords and not ejected out Pharyngeal- Puree -- Pharyngeal -- Pharyngeal- Mechanical Soft -- Pharyngeal -- Pharyngeal- Regular WFL Pharyngeal -- Pharyngeal- Multi-consistency -- Pharyngeal -- Pharyngeal- Pill -- Pharyngeal -- Pharyngeal Comment --  CHL IP CERVICAL ESOPHAGEAL PHASE 01/15/2017 Cervical Esophageal Phase Impaired Pudding Teaspoon -- Pudding Cup -- Honey Teaspoon -- Honey Cup -- Nectar Teaspoon -- Nectar Cup -- Nectar Straw -- Thin Teaspoon -- Thin Cup -- Thin Straw -- Puree -- Mechanical Soft -- Regular -- Multi-consistency -- Pill -- Cervical Esophageal Comment -- CHL IP GO 01/15/2017 Functional Assessment Tool Used skilled clinical judgement Functional Limitations Swallowing Swallow Current Status (K0254) CK Swallow Goal Status (Y7062) CJ Swallow Discharge Status (B7628) CJ Motor Speech Current Status (B1517) (None) Motor Speech Goal Status (O1607) (None) Motor Speech Goal Status (P7106) (None) Spoken Language Comprehension Current Status  (Y6948) (None) Spoken Language Comprehension Goal Status (N4627) (None) Spoken Language Comprehension Discharge Status (O3500) (None) Spoken Language Expression Current Status (X3818) (None) Spoken Language Expression Goal Status (E9937) (None) Spoken Language Expression Discharge Status [phone]) (None) Attention Current Status (E9381) (None) Attention Goal Status (O1751) (None) Attention Discharge Status (W2585) (None) Memory Current Status (I7782) (None) Memory Goal Status (U2353) (None) Memory Discharge Status (I1443) (None) Voice Current Status (X5400) (None) Voice Goal Status (Q6761) (None) Voice Discharge Status (P5093) (None) Other Speech-Language Pathology Functional Limitation Current Status (O6712) (None) Other Speech-Language Pathology Functional Limitation Goal Status (W5809) (None) Other Speech-Language Pathology Functional Limitation Discharge  Status [phone]) (None) [name] 01/15/2017, 12:02 PM [name] [name].Ed CCC-SLP Pager [phone]                   Scheduled Meds: . heparin  5,000 Units Subcutaneous Q8H  . pantoprazole (PROTONIX) IV  40 mg Intravenous QHS   Continuous Infusions: . lacosamide (VIMPAT) IV    . levETIRAcetam    . valproate sodium    . valproate sodium       LOS: 0 days    Time spent: 25 min    Malone, DO [facility] Pager [phone]  If 7PM-7AM, please contact night-coverage [email or web address] Password [email or web address] 01/15/2017, 1:29 PM

## 2017-01-15 NOTE — Evaluation (Signed)
Clinical/Bedside Swallow Evaluation Patient Details  Name: Marco [name] MRN: [id number] Date of Birth: March 16, 1951  Today's Date: 01/15/2017 Time: SLP Start Time (ACUTE ONLY): 0934 SLP Stop Time (ACUTE ONLY): 0948 SLP Time Calculation (min) (ACUTE ONLY): 14 min  Past Medical History: No past medical history on file. Past Surgical History: No past surgical history on file. HPI:  Marco Reesor Watkinsis a 66 y.o.malemedical history significant of severe intellectual disability, seizure disorder, SCC of L. Lung on chemotherapy followed by Dr. Earlie Server admitted after having a seizure at his group home. CT attempted resulting in nondiagnostic head CT due to severe patient motion artifact.  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 Suspect some associated airspace collapse in this region given the configuration of the heart and left-sided volume loss, right lung remains clear.   Assessment / Plan / Recommendation Clinical Impression  Oral dysphagia marked by bilateral spill/labial residue with thin and puree. Multiple swallows, suspect delayed swallow initiation, audible swallows, eructation and immediate cough following solid cracker texture. MBS scheduled for today at 10:30 to assess pharyngeal phase.   SLP Visit Diagnosis: Dysphagia, unspecified (R13.10)    Aspiration Risk  Severe aspiration risk;Moderate aspiration risk    Diet Recommendation NPO        Other  Recommendations Oral Care Recommendations: Oral care QID   Follow up Recommendations  (TBD)      Frequency and Duration            Prognosis        Swallow Study   General HPI: Marco Bowen Watkinsis a 66 y.o.malemedical history significant of severe intellectual disability, seizure disorder, SCC of L. Lung on chemotherapy followed by Dr. Earlie Server admitted after having a seizure at his group home. CT attempted  resulting in nondiagnostic head CT due to severe patient motion artifact.  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 Suspect some associated airspace collapse in this region given the configuration of the heart and left-sided volume loss, right lung remains clear. Type of Study: Bedside Swallow Evaluation Previous Swallow Assessment:  (none found) Diet Prior to this Study: NPO Temperature Spikes Noted: No Respiratory Status: Room air History of Recent Intubation: No Behavior/Cognition: Cooperative;Lethargic/Drowsy;Requires cueing;Distractible Oral Cavity Assessment: Within Functional Limits Oral Care Completed by SLP: No Oral Cavity - Dentition: Poor condition;Missing dentition Vision: Functional for self-feeding Self-Feeding Abilities: Able to feed self;Needs assist;Needs set up Patient Positioning: Upright in bed Baseline Vocal Quality: Low vocal intensity Volitional Cough: Weak Volitional Swallow: Unable to elicit    Oral/Motor/Sensory Function Overall Oral Motor/Sensory Function: Generalized oral weakness   Ice Chips Ice chips: Not tested   Thin Liquid Thin Liquid: Impaired Presentation: Cup;Straw Oral Phase Impairments: Reduced labial seal Oral Phase Functional Implications: Right anterior spillage;Left anterior spillage Pharyngeal  Phase Impairments: Suspected delayed Swallow;Multiple swallows (audible swallows)    Nectar Thick Nectar Thick Liquid: Not tested   Honey Thick Honey Thick Liquid: Not tested   Puree Puree: Impaired Oral Phase Impairments: Reduced labial seal Oral Phase Functional Implications: Right anterior spillage;Left anterior spillage Pharyngeal Phase Impairments: Suspected delayed Swallow;Multiple swallows   Solid   GO   Solid: Impaired Presentation: Spoon Oral Phase Functional Implications: Impaired mastication Pharyngeal Phase Impairments:  Suspected delayed Swallow;Multiple swallows;Cough - Immediate    Functional Assessment Tool Used: skilled clinical judgement  Functional Limitations: Swallowing Swallow Current Status [phone]): At least 60 percent but less than 80 percent impaired, limited or restricted Swallow Goal Status [phone]): At least 40 percent but less than 60 percent impaired, limited or restricted   Marco [name] 01/15/2017,10:02 AM  Marco [name].Ed [employer] [phone]

## 2017-01-15 NOTE — Progress Notes (Signed)
Modified Barium Swallow Progress Note  Patient Details  Name: VON QUINTANAR MRN: [id number] Date of Birth: [birth year]  Today's Date: 01/15/2017  Modified Barium Swallow completed.  Full report located under Chart Review in the Imaging Section.  Brief recommendations include the following:  Clinical Impression  Pt exhibits mild oral and pharyngeal dysphagia marked by decreased epiglottic deflection and laryngeal closure. Laryngeal penetration (reflexive cough/throat clear majority of time) with thin and nectar intermittently (including flash, to vocal cords, subsequent swallows cleared). Pt with frequent eructation at bedside and during MBS with intermittently tight UES and slower cervical esophageal transit. Vallecular and pyriform sinus residue reduced with spontaneous swallows. Pt is very impulsive and is unable to modify sip size despite max verbal cues. There was no significant difference between thin and nectar re; penetration. No pna on CXR and pt admitted for seizure. Recommend Dys 3 texture (mechanical soft), thin liquids, no straws, tactile cues (remove cup from pt when needed), full supervision and crush meds.      Swallow Evaluation Recommendations       SLP Diet Recommendations: Dysphagia 3 (Mech soft) solids;Thin liquid   Liquid Administration via: Cup;No straw   Medication Administration: Crushed with puree   Supervision: Staff to assist with self feeding;Full supervision/cueing for compensatory strategies   Compensations: Minimize environmental distractions;Slow rate;Small sips/bites   Postural Changes: Remain semi-upright after after feeds/meals (Comment)   Oral Care Recommendations: Oral care BID        [name] 01/15/2017,12:02 PM   [name] [name].Ed [employer] [phone]

## 2017-01-15 NOTE — Progress Notes (Signed)
Subjective: This is a patient with a history of seizures who is on 3 antiepileptic medications. Patient presented to the ED after having 3 recurrent seizures. Son at bedside states that he has recently restarted chemotherapy for his lung cancer and has been doing very well on antiepileptic medication prior to restarting his chemotherapy.  Home antiepileptic drugs include Depakote 750 mg twice a day, Vimpat 200 mg twice a day, Keppra 1500 mg by mouth twice a day  Unfortunately nobody restarted his antiepileptic medication today. I have restarted him on Depakote 750 mg twice a day IV, Vimpat 200 mg twice a day IV, Keppra 1000 mg twice a day IV.   Objective: Current vital signs: BP 95/72 (BP Location: Right Arm)   Pulse 61   Temp 97.5 F (36.4 C) (Oral)   Resp 20   Ht 6' (1.829 m)   Wt 77.1 kg (170 lb)   SpO2 100%   BMI 23.06 kg/m  Vital signs in last 24 hours: Temp:  [97.4 F (36.3 C)-101 F (38.3 C)] 97.5 F (36.4 C) (05/14 0901) Pulse Rate:  [31-143] 61 (05/14 0901) Resp:  [14-30] 20 (05/14 0901) BP: (88-126)/(55-83) 95/72 (05/14 0901) SpO2:  [92 %-100 %] 100 % (05/14 0901) Weight:  [77.1 kg (170 lb)] 77.1 kg (170 lb) (05/13 1251)  Intake/Output from previous day: 05/13 0701 - 05/14 0700 In: 2690 [IV Piggyback:2690] Out: 350 [Urine:350] Intake/Output this shift: No intake/output data recorded. Nutritional status: Diet NPO time specified  ROS:                                                                                                                                       History obtained from the patient  General ROS: negative for - chills, fatigue, fever, night sweats, weight gain or weight loss Psychological ROS: negative for - behavioral disorder, hallucinations, memory difficulties, mood swings or suicidal ideation Ophthalmic ROS: negative for - blurry vision, double vision, eye pain or loss of vision ENT ROS: negative for - epistaxis, nasal discharge, oral  lesions, sore throat, tinnitus or vertigo Allergy and Immunology ROS: negative for - hives or itchy/watery eyes Hematological and Lymphatic ROS: negative for - bleeding problems, bruising or swollen lymph nodes Endocrine ROS: negative for - galactorrhea, hair pattern changes, polydipsia/polyuria or temperature intolerance Respiratory ROS: negative for - cough, hemoptysis, shortness of breath or wheezing Cardiovascular ROS: negative for - chest pain, dyspnea on exertion, edema or irregular heartbeat Gastrointestinal ROS: negative for - abdominal pain, diarrhea, hematemesis, nausea/vomiting or stool incontinence Genito-Urinary ROS: negative for - dysuria, hematuria, incontinence or urinary frequency/urgency Musculoskeletal ROS: negative for - joint swelling or muscular weakness Neurological ROS: as noted in HPI Dermatological ROS: negative for rash and skin lesion changes    Neurologic Exam: General: NAD Mental Status: Alert, oriented, thought content appropriate.  Speech fluent without evidence of aphasia.  Able to follow 3 step commands without difficulty.  Cranial Nerves: II:  Visual fields grossly normal, pupils equal, round, reactive to light and accommodation III,IV, VI: ptosis not present, extra-ocular motions intact bilaterally V,VII: smile symmetric, facial light touch sensation normal bilaterally VIII: hearing normal bilaterally IX,X: uvula rises symmetrically XI: bilateral shoulder shrug XII: midline tongue extension without atrophy or fasciculations  Motor: Right : Upper extremity   5/5    Left:     Upper extremity   5/5  Lower extremity   5/5     Lower extremity   5/5 Tone and bulk:normal tone throughout; no atrophy noted Sensory: Pinprick and light touch intact throughout, bilaterally Deep Tendon Reflexes:  Right: Upper Extremity   Left: Upper extremity   biceps (C-5 to C-6) 2/4   biceps (C-5 to C-6) 2/4 tricep (C7) 2/4    triceps (C7) 2/4 Brachioradialis (C6)  2/4  Brachioradialis (C6) 2/4  Lower Extremity Lower Extremity  quadriceps (L-2 to L-4) 2/4   quadriceps (L-2 to L-4) 2/4 Achilles (S1) 2/4   Achilles (S1) 2/4  Plantars: Right: downgoing   Left: downgoing Cerebellar: normal finger-to-nose,  normal heel-to-shin test   Lab Results: Basic Metabolic Panel:  Recent Labs Lab 01/14/17 1257 01/14/17 1319  NA 139 141  K 3.6 3.6  CL 104 106  CO2 20*  --   GLUCOSE 195* 189*  BUN 18 22*  CREATININE 1.47* 1.20  CALCIUM 9.1  --   MG 2.0  --   PHOS 3.5  --     Liver Function Tests:  Recent Labs Lab 01/14/17 1257  AST 21  ALT 10*  ALKPHOS 64  BILITOT 0.3  PROT 7.5  ALBUMIN 3.6   No results for input(s): LIPASE, AMYLASE in the last 168 hours.  Recent Labs Lab 01/14/17 1306 01/15/17 0527  AMMONIA 54* 16    CBC:  Recent Labs Lab 01/14/17 1257 01/14/17 1319  WBC 11.3*  --   NEUTROABS 10.0*  --   HGB 13.1 13.9  HCT 41.7 41.0  MCV 88.7  --   PLT 179  --     Cardiac Enzymes: No results for input(s): CKTOTAL, CKMB, CKMBINDEX, TROPONINI in the last 168 hours.  Lipid Panel: No results for input(s): CHOL, TRIG, HDL, CHOLHDL, VLDL, LDLCALC in the last 168 hours.  CBG:  Recent Labs Lab 01/14/17 1254  GLUCAP 187*    Microbiology: Results for orders placed or performed during the hospital encounter of 01/14/17  Urine culture     Status: None   Collection Time: 01/14/17  1:20 PM  Result Value Ref Range Status   Specimen Description URINE, CATHETERIZED  Final   Special Requests NONE  Final   Culture NO GROWTH  Final   Report Status 01/15/2017 FINAL  Final    Coagulation Studies: No results for input(s): LABPROT, INR in the last 72 hours.  Imaging: Ct Head Wo Contrast  Result Date: 01/14/2017 CLINICAL DATA:  Severe AMS pt, try to put strap around his head to do the scan, three unsuccessful attempt to get the scan. Emergency doctor asked to get the scan. EXAM: CT HEAD WITHOUT CONTRAST TECHNIQUE:  Contiguous axial images were obtained from the base of the skull through the vertex without intravenous contrast. COMPARISON:  None. FINDINGS: Brain: Head CT is nondiagnostic due to severe patient motion artifact. Three separate scans were attempted, all nondiagnostic. Vascular: Cannot be assessed. Skull: Markedly limited characterization due to patient motion artifact. No obvious skull fracture or displacement. Sinuses/Orbits: Sinuses grossly clear. Other: None IMPRESSION: Nondiagnostic head  CT due to severe patient motion artifact. Electronically Signed   By: Franki Cabot M.D.   On: 01/14/2017 15:16   Dg Chest Portable 1 View  Result Date: 01/14/2017 CLINICAL DATA:  Seizure like activity. EXAM: PORTABLE CHEST 1 VIEW COMPARISON:  None. FINDINGS: Heart size is normal. Grossly stable density overlying the region of the aortic arch, compatible with the suprahilar mass demonstrated on earlier chest CT of 09/07/2016. Heart size is upper normal, stable. Straightening of the left heart shadow, indicating associated airspace collapse. No acute or suspicious osseous finding. IMPRESSION: 1. 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 Suspect some associated airspace collapse in this region given the configuration of the heart and left-sided volume loss. 2. Right lung remains clear. Electronically Signed   By: Franki Cabot M.D.   On: 01/14/2017 13:36    Medications:  Scheduled: . heparin  5,000 Units Subcutaneous Q8H  . pantoprazole (PROTONIX) IV  40 mg Intravenous QHS    Assessment/Plan: Breakthrough seizure in the setting of restarting chemotherapy and subtherapeutic levels of Depakote. At this time patient is back to his baseline and seizure-free. Antiepileptic medications have been restarted via IV secondary to not passing swallow screen. I've also loaded him with 500 mg of Depakote as his level was  subtherapeutic and this will bump him up to therapeutic range.       Etta Quill PA-C [facility] [phone]  01/15/2017, 11:21 AM

## 2017-01-16 ENCOUNTER — Telehealth: Payer: Self-pay | Admitting: Internal Medicine

## 2017-01-16 DIAGNOSIS — R509 Fever, unspecified: Secondary | ICD-10-CM | POA: Diagnosis not present

## 2017-01-16 DIAGNOSIS — N179 Acute kidney failure, unspecified: Secondary | ICD-10-CM | POA: Diagnosis not present

## 2017-01-16 DIAGNOSIS — G40909 Epilepsy, unspecified, not intractable, without status epilepticus: Secondary | ICD-10-CM | POA: Diagnosis not present

## 2017-01-16 DIAGNOSIS — R569 Unspecified convulsions: Secondary | ICD-10-CM | POA: Diagnosis not present

## 2017-01-16 DIAGNOSIS — E872 Acidosis: Secondary | ICD-10-CM | POA: Diagnosis not present

## 2017-01-16 LAB — VALPROIC ACID LEVEL: Valproic Acid Lvl: 53 ug/mL (ref 50.0–100.0)

## 2017-01-16 MED ORDER — FLUTICASONE PROPIONATE 50 MCG/ACT NA SUSP
1.0000 | Freq: Every day | NASAL | Status: DC
  Administered 2017-01-17: 1 via NASAL
  Filled 2017-01-16: qty 16

## 2017-01-16 MED ORDER — DONEPEZIL HCL 10 MG PO TABS
10.0000 mg | ORAL_TABLET | Freq: Every day | ORAL | Status: DC
  Administered 2017-01-16: 10 mg via ORAL
  Filled 2017-01-16: qty 1

## 2017-01-16 MED ORDER — LORAZEPAM 2 MG/ML IJ SOLN
0.5000 mg | INTRAMUSCULAR | Status: DC | PRN
  Administered 2017-01-16: 0.5 mg via INTRAVENOUS
  Filled 2017-01-16: qty 1

## 2017-01-16 MED ORDER — TRAZODONE HCL 50 MG PO TABS: 25.0000 mg | ORAL_TABLET | Freq: Every evening | ORAL | Status: DC | PRN

## 2017-01-16 MED ORDER — LORAZEPAM 1 MG PO TABS
2.0000 mg | ORAL_TABLET | Freq: Every day | ORAL | Status: DC
  Administered 2017-01-16: 2 mg via ORAL
  Filled 2017-01-16: qty 2

## 2017-01-16 MED ORDER — RISPERIDONE 0.5 MG PO TABS
1.0000 mg | ORAL_TABLET | Freq: Every day | ORAL | Status: DC
  Administered 2017-01-16: 1 mg via ORAL
  Filled 2017-01-16: qty 2

## 2017-01-16 NOTE — Clinical Social Work Note (Signed)
Clinical Social Work Assessment  Patient Details  Name: Marco [name] MRN: [id number] Date of Birth: 1950/10/06  Date of referral:  01/16/17               Reason for consult:  Discharge Planning                Permission sought to share information with:  Facility Sport and exercise psychologist, Family Supports Permission granted to share information::  Yes, Verbal Permission Granted  Name::     Materials engineer::  [place]  Relationship::  Sister  Contact Information:     Housing/Transportation Living arrangements for the past 2 months:  [place] of Information:  Other (Comment Required) (Sister, legal guardian) Patient Interpreter Needed:  None Criminal Activity/Legal Involvement Pertinent to Current Situation/Hospitalization:  No - Comment as needed Significant Relationships:  Siblings, Community Support Lives with:  Self, Facility Resident Do you feel safe going back to the place where you live?  Yes Need for family participation in patient care:  Yes (Comment) (pt has legal guardian)  Care giving concerns:  Pt currently resides at [facility], and per pt's sister, there are no concerns about care received at his current group home.   Social Worker assessment / plan:  CSW explained role in discharge planning to pt's sister and confirmed with her that the pt was to return to his group home after DC from hospital. Pt's sister explained that she had the home that he grew up in licensed as a group home so that he would have a stable, comfortable place to stay. Pt's sister confirmed the plan for pt to return there, as well as to contact the caregivers involved in order to facilitate the discharge planning process. CSW met with caregiver to confirm that the group home staff were planning to receive him back ot their care following DC from hospital. Pt will return to group home when medically ready. CSW to follow to coordinate discharge planning.  Employment status:   Disabled (Comment on whether or not currently receiving Disability) Insurance information:  Medicare PT Recommendations:  Not assessed at this time Information / Referral to community resources:     Patient/Family's Response to care:  Pt unable to respond due to being disoriented. Pt's sister agreeable to return to group home.  Patient/Family's Understanding of and Emotional Response to Diagnosis, Current Treatment, and Prognosis:  Pt's sister acknowledged some disappointment in not being able to be at the hospital in person to coordinate, due to being under the weather. Pt's sister discussed appreciation for the caregivers involved and how well they work with the pt and handle his needs. Pt's sister indicated understanding of CSW role in coordinating discharge planning.  Emotional Assessment Appearance:  Appears stated age Attitude/Demeanor/Rapport:  Unable to Assess Affect (typically observed):  Unable to Assess Orientation:  Oriented to Self Alcohol / Substance use:  Not Applicable Psych involvement (Current and /or in the community):  No (Comment)  Discharge Needs  Concerns to be addressed:  Care Coordination Readmission within the last 30 days:  No Current discharge risk:  Physical Impairment Barriers to Discharge:  Continued Medical Work up   [employer], [place] 01/16/2017, 1:41 PM

## 2017-01-16 NOTE — Progress Notes (Signed)
PROGRESS NOTE    Marco [name]  FUX:[id number] DOB: [birth year] DOA: 01/14/2017 PCP: Leamon Arnt, MD   Outpatient Specialists:     Brief Narrative:  Marco [name] is a 66 y.o. male  medical history significant of severe mental retardation, seizure disorder followed by Dr. Sarina Ill, [facility]. Lung on chemotherapy followed by Dr. Earlie Server; patient had a seizure at his group home. EMS was activated. They give the patient Versed en route which stopped his tonic-clonic seizure. Patient was then brought to the emergency room for evaluation.   Assessment & Plan:   Active Problems:   Seizure ([place])   Recurrent seizures with H/O Seizure disorder: Acute.  - Seizure precautions and close monitoring for seizure-like activity -DYS diet - resume home meds -Neurology was consulted and is following MRI ordered with sedation by neurology- pending  Fever and lactic acid Patient had a full tonic-clonic seizure prior to arrival Lactic acid was noted to be elevated as well as body temperature These are both likely due to patient having a seizure. Given vanc/zosyn x 1 -watch for signs of aspiration PNA  Renal insufficiency: Acute. Baseline creatinine previously noted between 0.8-1. Patient presents with elevated creatinine of 1.2 on admission. - monitor  Squamous cell carcinoma of the left lung: Stage IIa diagnosis in January 2015. S/p concurrent chemoradiation currently on Nivolumab 240 MG IV every 2 weeks(last infusion was 4/19). - Continue outpatient f/u with oncology -added Marco [name] to treatment team  Severe Mental retardation And h/o dementia - Set bed alarm on - Sitter to bedside  GERD - Protonix   DVT prophylaxis:  SQ Heparin  Code Status: Full Code   Family Communication: Sitter at bedside  Disposition Plan:     Consultants:   neuro     Subjective: Wants to go home  Objective: Vitals:   01/15/17 1715 01/16/17 0254 01/16/17 0500 01/16/17  1009  BP: 94/60 108/64 126/72 97/61  Pulse: 97 80 71 86  Resp: '20 18 16 17  '$ Temp: 98.4 F (36.9 C) 97.5 F (36.4 C) 97.8 F (36.6 C) 97.9 F (36.6 C)  TempSrc: Oral Oral Oral Oral  SpO2: 98% 96% 95% 100%  Weight:      Height:        Intake/Output Summary (Last 24 hours) at 01/16/17 1244 Last data filed at 01/16/17 1212  Gross per 24 hour  Intake              500 ml  Output              800 ml  Net             -300 ml   Filed Weights   01/14/17 1251  Weight: 77.1 kg (170 lb)    Examination:  General exam: Appears calm and comfortable- speech difficult to understand Respiratory system: Clear to auscultation. Respiratory effort normal. Cardiovascular system: S1 & S2 heard, RRR. No JVD, murmurs, rubs, gallops or clicks. No pedal edema. Gastrointestinal system: Abdomen is nondistended, soft and nontender. No organomegaly or masses felt. Normal bowel sounds heard. Central nervous system: No focal neurological deficits.      Data Reviewed: I have personally reviewed following labs and imaging studies  CBC:  Recent Labs Lab 01/14/17 1257 01/14/17 1319  WBC 11.3*  --   NEUTROABS 10.0*  --   HGB 13.1 13.9  HCT 41.7 41.0  MCV 88.7  --   PLT 179  --    Basic Metabolic Panel:  Recent Labs Lab 01/14/17 1257 01/14/17 1319  NA 139 141  K 3.6 3.6  CL 104 106  CO2 20*  --   GLUCOSE 195* 189*  BUN 18 22*  CREATININE 1.47* 1.20  CALCIUM 9.1  --   MG 2.0  --   PHOS 3.5  --    GFR: Estimated Creatinine Clearance: 64.3 mL/min (by C-G formula based on SCr of 1.2 mg/dL). Liver Function Tests:  Recent Labs Lab 01/14/17 1257  AST 21  ALT 10*  ALKPHOS 64  BILITOT 0.3  PROT 7.5  ALBUMIN 3.6   No results for input(s): LIPASE, AMYLASE in the last 168 hours.  Recent Labs Lab 01/14/17 1306 01/15/17 0527  AMMONIA 54* 16   Coagulation Profile: No results for input(s): INR, PROTIME in the last 168 hours. Cardiac Enzymes: No results for input(s): CKTOTAL,  CKMB, CKMBINDEX, TROPONINI in the last 168 hours. BNP (last 3 results) No results for input(s): PROBNP in the last 8760 hours. HbA1C: No results for input(s): HGBA1C in the last 72 hours. CBG:  Recent Labs Lab 01/14/17 1254  GLUCAP 187*   Lipid Profile: No results for input(s): CHOL, HDL, LDLCALC, TRIG, CHOLHDL, LDLDIRECT in the last 72 hours. Thyroid Function Tests: No results for input(s): TSH, T4TOTAL, FREET4, T3FREE, THYROIDAB in the last 72 hours. Anemia Panel: No results for input(s): VITAMINB12, FOLATE, FERRITIN, TIBC, IRON, RETICCTPCT in the last 72 hours. Urine analysis:    Component Value Date/Time   COLORURINE YELLOW 01/14/2017 1320   APPEARANCEUR HAZY (A) 01/14/2017 1320   LABSPEC 1.020 01/14/2017 1320   PHURINE 5.0 01/14/2017 1320   GLUCOSEU NEGATIVE 01/14/2017 1320   HGBUR NEGATIVE 01/14/2017 1320   BILIRUBINUR NEGATIVE 01/14/2017 1320   [place] 01/14/2017 1320   PROTEINUR 30 (A) 01/14/2017 1320   NITRITE NEGATIVE 01/14/2017 1320   LEUKOCYTESUR NEGATIVE 01/14/2017 1320      Recent Results (from the past 240 hour(s))  Urine culture     Status: None   Collection Time: 01/14/17  1:20 PM  Result Value Ref Range Status   Specimen Description URINE, CATHETERIZED  Final   Special Requests NONE  Final   Culture NO GROWTH  Final   Report Status 01/15/2017 FINAL  Final  Blood culture (routine x 2)     Status: None (Preliminary result)   Collection Time: 01/14/17  1:27 PM  Result Value Ref Range Status   Specimen Description BLOOD LEFT HAND  Final   Special Requests IN PEDIATRIC BOTTLE Blood Culture adequate volume  Final   Culture NO GROWTH 2 DAYS  Final   Report Status PENDING  Incomplete  Blood culture (routine x 2)     Status: None (Preliminary result)   Collection Time: 01/14/17  1:37 PM  Result Value Ref Range Status   Specimen Description BLOOD RIGHT ANTECUBITAL  Final   Special Requests   Final    BOTTLES DRAWN AEROBIC AND ANAEROBIC Blood  Culture adequate volume   Culture NO GROWTH 2 DAYS  Final   Report Status PENDING  Incomplete      Anti-infectives    Start     Dose/Rate Route Frequency Ordered Stop   01/14/17 1330  vancomycin (VANCOCIN) 1,500 mg in sodium chloride 0.9 % 500 mL IVPB     1,500 mg 250 mL/hr over 120 Minutes Intravenous  Once 01/14/17 1321 01/14/17 1604   01/14/17 1330  piperacillin-tazobactam (ZOSYN) IVPB 4.5 g     4.5 g 200 mL/hr over 30 Minutes Intravenous  Once 01/14/17 1321 01/14/17 1651       Radiology Studies: Ct Head Wo Contrast  Result Date: 01/14/2017 CLINICAL DATA:  Severe AMS pt, try to put strap around his head to do the scan, three unsuccessful attempt to get the scan. Emergency doctor asked to get the scan. EXAM: CT HEAD WITHOUT CONTRAST TECHNIQUE: Contiguous axial images were obtained from the base of the skull through the vertex without intravenous contrast. COMPARISON:  None. FINDINGS: Brain: Head CT is nondiagnostic due to severe patient motion artifact. Three separate scans were attempted, all nondiagnostic. Vascular: Cannot be assessed. Skull: Markedly limited characterization due to patient motion artifact. No obvious skull fracture or displacement. Sinuses/Orbits: Sinuses grossly clear. Other: None IMPRESSION: Nondiagnostic head CT due to severe patient motion artifact. Electronically Signed   By: Franki Cabot M.D.   On: 01/14/2017 15:16   Dg Chest Portable 1 View  Result Date: 01/14/2017 CLINICAL DATA:  Seizure like activity. EXAM: PORTABLE CHEST 1 VIEW COMPARISON:  None. FINDINGS: Heart size is normal. Grossly stable density overlying the region of the aortic arch, compatible with the suprahilar mass demonstrated on earlier chest CT of 09/07/2016. Heart size is upper normal, stable. Straightening of the left heart shadow, indicating associated airspace collapse. No acute or suspicious osseous finding. IMPRESSION: 1. Grossly stable density overlying the region of the aortic arch,  compatible with the left suprahilar mass demonstrated on earlier chest CT of 09/07/2016. Suspect some associated airspace collapse in this region given the configuration of the heart and left-sided volume loss. 2. Right lung remains clear. Electronically Signed   By: Franki Cabot M.D.   On: 01/14/2017 13:36   Dg Swallowing Func-speech Pathology  Result Date: 01/15/2017 Objective Swallowing Evaluation: Type of Study: Bedside Swallow Evaluation Patient Details Name: Marco [name] MRN: [id number] Date of Birth: 07/13/1951 Today's Date: 01/15/2017 Time: SLP Start Time (ACUTE ONLY): 1050-SLP Stop Time (ACUTE ONLY): 1110 SLP Time Calculation (min) (ACUTE ONLY): 20 min Past Medical History: No past medical history on file. Past Surgical History: No past surgical history on file. HPI: Khing Belcher Watkinsis a 66 y.o.malemedical history significant of severe intellectual disability, seizure disorder, SCC of L. Lung on chemotherapy followed by Dr. Earlie Server admitted after having a seizure at his group home. CT attempted resulting in nondiagnostic head CT due to severe patient motion artifact.  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 Suspect some associated airspace collapse in this region given the configuration of the heart and left-sided volume loss, right lung remains clear. No Data Recorded Assessment / Plan / Recommendation CHL IP CLINICAL IMPRESSIONS 01/15/2017 Clinical Impression Pt exhibits mild oral and pharyngeal dysphagia marked by decreased epiglottic deflection and laryngeal closure. Laryngeal penetration (reflexive cough/throat clear majority of time) with thin and nectar intermittently (including flash, to vocal cords, subsequent swallows cleared). Pt with frequent eructation at bedside and during MBS with intermittently tight UES and slower cervical esophageal transit. Vallecular and pyriform  sinus residue reduced with spontaneous swallows. Pt is very impulsive and is unable to modify sip size despite max verbal cues. There was no significant difference between thin and nectar re; penetration. No pna on CXR and pt admitted for seizure. Recommend Dys 3 texture (mechanical soft), thin liquids, no straws, tactile cues (remove cup from pt when needed), full supervision and crush meds.    SLP Visit Diagnosis Dysphagia, unspecified (R13.10) Attention and concentration deficit following --  Frontal lobe and executive function deficit following -- Impact on safety and function Moderate aspiration risk   CHL IP TREATMENT RECOMMENDATION 01/15/2017 Treatment Recommendations Therapy as outlined in treatment plan below   Prognosis 01/15/2017 Prognosis for Safe Diet Advancement Fair Barriers to Reach Goals Cognitive deficits Barriers/Prognosis Comment -- CHL IP DIET RECOMMENDATION 01/15/2017 SLP Diet Recommendations Dysphagia 3 (Mech soft) solids;Thin liquid Liquid Administration via Cup;No straw Medication Administration Crushed with puree Compensations Minimize environmental distractions;Slow rate;Small sips/bites Postural Changes Remain semi-upright after after feeds/meals (Comment)   CHL IP OTHER RECOMMENDATIONS 01/15/2017 Recommended Consults -- Oral Care Recommendations Oral care BID Other Recommendations --   CHL IP FOLLOW UP RECOMMENDATIONS 01/15/2017 Follow up Recommendations None   CHL IP FREQUENCY AND DURATION 01/15/2017 Speech Therapy Frequency (ACUTE ONLY) min 2x/week Treatment Duration 2 weeks      CHL IP ORAL PHASE 01/15/2017 Oral Phase Impaired Oral - Pudding Teaspoon -- Oral - Pudding Cup -- Oral - Honey Teaspoon -- Oral - Honey Cup -- Oral - Nectar Teaspoon -- Oral - Nectar Cup WFL Oral - Nectar Straw -- Oral - Thin Teaspoon -- Oral - Thin Cup WFL Oral - Thin Straw -- Oral - Puree -- Oral - Mech Soft -- Oral - Regular Delayed oral transit Oral - Multi-Consistency -- Oral - Pill -- Oral Phase - Comment --   CHL IP PHARYNGEAL PHASE 01/15/2017 Pharyngeal Phase Impaired Pharyngeal- Pudding Teaspoon -- Pharyngeal -- Pharyngeal- Pudding Cup -- Pharyngeal -- Pharyngeal- Honey Teaspoon -- Pharyngeal -- Pharyngeal- Honey Cup -- Pharyngeal -- Pharyngeal- Nectar Teaspoon -- Pharyngeal -- Pharyngeal- Nectar Cup Pharyngeal residue - valleculae;Pharyngeal residue - pyriform;Reduced epiglottic inversion Pharyngeal -- Pharyngeal- Nectar Straw -- Pharyngeal -- Pharyngeal- Thin Teaspoon -- Pharyngeal -- Pharyngeal- Thin Cup Penetration/Aspiration during swallow;Pharyngeal residue - valleculae;Pharyngeal residue - pyriform;Reduced epiglottic inversion Pharyngeal Material enters airway, remains ABOVE vocal cords then ejected out;Material enters airway, CONTACTS cords and then ejected out;Material enters airway, remains ABOVE vocal cords and not ejected out Pharyngeal- Thin Straw Penetration/Aspiration during swallow Pharyngeal Material enters airway, remains ABOVE vocal cords then ejected out;Material enters airway, remains ABOVE vocal cords and not ejected out Pharyngeal- Puree -- Pharyngeal -- Pharyngeal- Mechanical Soft -- Pharyngeal -- Pharyngeal- Regular WFL Pharyngeal -- Pharyngeal- Multi-consistency -- Pharyngeal -- Pharyngeal- Pill -- Pharyngeal -- Pharyngeal Comment --  CHL IP CERVICAL ESOPHAGEAL PHASE 01/15/2017 Cervical Esophageal Phase Impaired Pudding Teaspoon -- Pudding Cup -- Honey Teaspoon -- Honey Cup -- Nectar Teaspoon -- Nectar Cup -- Nectar Straw -- Thin Teaspoon -- Thin Cup -- Thin Straw -- Puree -- Mechanical Soft -- Regular -- Multi-consistency -- Pill -- Cervical Esophageal Comment -- CHL IP GO 01/15/2017 Functional Assessment Tool Used skilled clinical judgement Functional Limitations Swallowing Swallow Current Status (N0272) CK Swallow Goal Status (Z3664) CJ Swallow Discharge Status (Q0347) CJ Motor Speech Current Status (Q2595) (None) Motor Speech Goal Status (G3875) (None) Motor Speech Goal Status (I4332) (None)  Spoken Language Comprehension Current Status (R5188) (None) Spoken Language Comprehension Goal Status (C1660) (None) Spoken Language Comprehension Discharge Status (Y3016) (None) Spoken Language Expression Current Status (W1093) (None) Spoken Language Expression Goal Status (A3557) (None) Spoken Language Expression Discharge Status [phone]) (None) Attention Current Status (R4270) (None) Attention Goal Status (W2376) (None) Attention Discharge Status (E8315) (None) Memory Current Status (V7616) (None) Memory Goal Status (W7371) (None) Memory Discharge Status (G6269) (None) Voice Current Status (S8546) (None) Voice Goal Status (E7035) (None) Voice Discharge Status (K0938) (None) Other Speech-Language Pathology Functional Limitation Current Status (H8299) (None) Other Speech-Language Pathology Functional Limitation  Goal Status [phone]) (None) Other Speech-Language Pathology Functional Limitation Discharge Status [phone]) (None) [name] 01/15/2017, 12:02 PM [name] [name].Ed CCC-SLP Pager [phone]                   Scheduled Meds: . heparin  5,000 Units Subcutaneous Q8H  . pantoprazole (PROTONIX) IV  40 mg Intravenous QHS   Continuous Infusions: . lacosamide (VIMPAT) IV Stopped (01/16/17 1120)  . levETIRAcetam Stopped (01/16/17 1021)  . valproate sodium 750 mg (01/16/17 1137)     LOS: 1 day    Time spent: 25 min    [place], DO [facility] Pager [phone]  If 7PM-7AM, please contact night-coverage [email or web address] Password [facility] 01/16/2017, 12:44 PM

## 2017-01-16 NOTE — NC FL2 (Signed)
[place] LEVEL OF CARE SCREENING TOOL     IDENTIFICATION  Patient Name: Marco [name] Birthdate: 08-19-1951 Sex: male Admission Date (Current Location): 01/14/2017  [facility] and [place] Number:  Herbalist and Address:  [facility], [place] [address], [place], [place] [address]      Provider Number: [id number]  Attending Physician Name and Address:  Geradine Girt, DO  Relative Name and Phone Number:       Current Level of Care: Hospital Recommended Level of Care: Other (Comment) (Group home) Prior Approval Number:    Date Approved/Denied:   PASRR Number:    Discharge Plan: Other (Comment) (Group home)    Current Diagnoses: Patient Active Problem List   Diagnosis Date Noted  . Seizure disorder ([place])   . Seizure ([place]) 01/14/2017    Orientation RESPIRATION BLADDER Height & Weight     Self  Normal Incontinent Weight: 170 lb (77.1 kg) Height:  6' (182.9 cm)  BEHAVIORAL SYMPTOMS/MOOD NEUROLOGICAL BOWEL NUTRITION STATUS    Convulsions/Seizures Continent    AMBULATORY STATUS COMMUNICATION OF NEEDS Skin   Supervision Verbally Normal                       Personal Care Assistance Level of Assistance  Bathing, Dressing Bathing Assistance: Limited assistance Feeding assistance: Independent Dressing Assistance: Limited assistance     Functional Limitations Info  Speech     Speech Info: Impaired (difficult to understand, slurred)    SPECIAL CARE FACTORS FREQUENCY                       Contractures      Additional Factors Info  Code Status, Allergies Code Status Info: full Allergies Info: nka           Current Medications (01/16/2017):  This is the current hospital active medication list Current Facility-Administered Medications  Medication Dose Route Frequency Provider Last Rate Last Dose  . acetaminophen (TYLENOL) tablet 650 mg  650 mg Oral Q4H PRN Elwin Mocha, MD       Or  .  acetaminophen (TYLENOL) suppository 650 mg  650 mg Rectal Q4H PRN Elwin Mocha, MD      . heparin injection 5,000 Units  5,000 Units Subcutaneous Q8H Elwin Mocha, MD   5,000 Units at 01/16/17 1313  . hydrALAZINE (APRESOLINE) injection 10 mg  10 mg Intravenous Q8H PRN Elwin Mocha, MD      . lacosamide (VIMPAT) 200 mg in sodium chloride 0.9 % 25 mL IVPB  200 mg Intravenous Q12H Marliss Coots, PA-C   Stopped at 01/16/17 1120  . levETIRAcetam (KEPPRA) 1,000 mg in sodium chloride 0.9 % 100 mL IVPB  1,000 mg Intravenous BID Marliss Coots, PA-C   Stopped at 01/16/17 1021  . LORazepam (ATIVAN) injection 0.5 mg  0.5 mg Intravenous Q4H PRN Eulogio Bear U, DO      . ondansetron (ZOFRAN) tablet 4 mg  4 mg Oral Q6H PRN Elwin Mocha, MD       Or  . ondansetron [facility]) injection 4 mg  4 mg Intravenous Q6H PRN Elwin Mocha, MD      . pantoprazole (PROTONIX) injection 40 mg  40 mg Intravenous QHS Elwin Mocha, MD   40 mg at 01/15/17 2122  . polyethylene glycol (MIRALAX / GLYCOLAX) packet 17 g  17 g Oral Daily PRN Elwin Mocha, MD      .  valproate (DEPACON) 750 mg in dextrose 5 % 50 mL IVPB  750 mg Intravenous Q12H Estill Bakes   Stopped at 01/16/17 1237     Discharge Medications: Please see discharge summary for a list of discharge medications.  Relevant Imaging Results:  Relevant Lab Results:   Additional Information ss#: [id number]  Geralynn Ochs, LCSW

## 2017-01-16 NOTE — Care Management Note (Signed)
Case Management Note  Patient Details  Name: Marco [name] MRN: [id number] Date of Birth: September 26, 1950  Subjective/Objective:  Pt admitted with seizures. He is from [facility].                   Action/Plan: Plan is for patient to return to Group home when medically ready. CM following.  Expected Discharge Date:                  Expected Discharge Plan:  Group Home  In-House Referral:  Clinical Social Work  Discharge planning Services     Post Acute Care Choice:    Choice offered to:     DME Arranged:    DME Agency:     HH Arranged:    [place] Agency:     Status of Service:  In process, will continue to follow  If discussed at Long Length of Stay Meetings, dates discussed:    Additional Comments:  Pollie Friar, RN 01/16/2017, 2:36 PM

## 2017-01-16 NOTE — Progress Notes (Signed)
  Speech Language Pathology Treatment: Dysphagia  Patient Details Name: Marco [name] MRN: [id number] DOB: 31-Mar-1951 Today's Date: 01/16/2017 Time: 1856-3149 SLP Time Calculation (min) (ACUTE ONLY): 17 min  Assessment / Plan / Recommendation Clinical Impression  Note seen re: yesterday's coughing episode with RN tech. Pt is impulsive and needs verbal and tactile cues to decrease bite size/ volume and remove food if pocketed. Coughing on saliva prior to initiating breakfast. Immediate and delayed coughing during meals with suspected penetration. SLP will downgrade to Dys 2 texture, continue thin, no straws, full supervision.   HPI HPI: Marco Suchy Watkinsis a 66 y.o.malemedical history significant of severe intellectual disability, seizure disorder, SCC of L. Lung on chemotherapy followed by Dr. Earlie Server admitted after having a seizure at his group home. CT attempted resulting in nondiagnostic head CT due to severe patient motion artifact.  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 Suspect some associated airspace collapse in this region given the configuration of the heart and left-sided volume loss, right lung remains clear.      SLP Plan  Continue with current plan of care       Recommendations  Diet recommendations: Dysphagia 2 (fine chop);Thin liquid Liquids provided via: Cup;No straw Medication Administration: Crushed with puree Supervision: Patient able to self feed;Full supervision/cueing for compensatory strategies Compensations: Minimize environmental distractions;Slow rate;Small sips/bites;Clear throat intermittently Postural Changes and/or Swallow Maneuvers: Seated upright 90 degrees                Oral Care Recommendations: Oral care BID Follow up Recommendations:  (possibly at group home) SLP Visit Diagnosis: Dysphagia, unspecified (R13.10) Plan:  Continue with current plan of care       GO                [name] 01/16/2017, 8:51 AM  [name] [name].Ed [employer] [phone]

## 2017-01-16 NOTE — Progress Notes (Signed)
Subjective: No further seizures overnight. Patient resting comfortably.  Exam: Vitals:   01/16/17 0500 01/16/17 1009  BP: 126/72 97/61  Pulse: 71 86  Resp: 16 17  Temp: 97.8 F (36.6 C) 97.9 F (36.6 C)    HEENT-  Normocephalic, no lesions, without obvious abnormality.  Normal external eye and conjunctiva.  Normal TM's bilaterally.  Normal auditory canals and external ears. Normal external nose, mucus membranes and septum.  Normal pharynx.    Neuro: --patient has mental slowness secondary to his mental disability but able to follow commands.  CN: Pupils are equal and round. They are symmetrically reactive from 3-->2 mm. EOMI without nystagmus. Facial sensation is intact to light touch. Face is symmetric at rest with normal strength and mobility. Hearing is intact to conversational voice. Palate elevates symmetrically and uvula is midline. Voice is normal in tone, pitch and quality. Bilateral SCM and trapezii are 5/5. Tongue is midline with normal bulk and mobility.  Motor: Normal bulk, tone, and strength. 5/5 throughout. No drift.  Sensation: Intact to light touch.  DTRs: 2+, symmetric  Toes downgoing bilaterally. No pathologic reflexes.  Coordination: Finger-to-nose and heel-to-shin are without dysmetria     Pertinent Labs/Diagnostics: EEG: IMPRESSION:  This EEG demonstrated no focal, hemispheric, or lateralizing features.  The interpretation is very limited by significant myogenic artifact throughout the tracing due to the fact that the patient was uncooperative.  There was no obvious epileptiform activity on the tracing.  Correlate clinically.      Impression: Breakthrough seizure in the setting of restarting chemotherapy and subtherapeutic levels of Depakote. Unfortunately although I had ordered a Depakote level to be drawn at 5:00 this morning time it was not drawn until 8:00 thus I am awaiting the Depakote level.   Recommendations: 1) MRI of brain with and without  contrast to be done under sedation as patient will not stay still 2) awaiting Depakote level   Etta Quill PA-C [facility] [phone] 01/16/2017, 10:12 AM

## 2017-01-16 NOTE — Telephone Encounter (Signed)
Patient's caregiver called and said that the patient is in the hospital and may not make the 5/17 appointment.

## 2017-01-17 ENCOUNTER — Inpatient Hospital Stay (HOSPITAL_COMMUNITY): Payer: Medicare Other | Admitting: Certified Registered Nurse Anesthetist

## 2017-01-17 ENCOUNTER — Encounter (HOSPITAL_COMMUNITY)
Admission: EM | Disposition: A | Discharge status: Home / Self Care | Payer: Self-pay | Source: Home / Self Care | Attending: Internal Medicine

## 2017-01-17 ENCOUNTER — Inpatient Hospital Stay (HOSPITAL_COMMUNITY): Payer: Medicare Other

## 2017-01-17 DIAGNOSIS — R569 Unspecified convulsions: Secondary | ICD-10-CM | POA: Diagnosis not present

## 2017-01-17 DIAGNOSIS — E872 Acidosis: Secondary | ICD-10-CM | POA: Diagnosis not present

## 2017-01-17 DIAGNOSIS — C3492 Malignant neoplasm of unspecified part of left bronchus or lung: Secondary | ICD-10-CM | POA: Diagnosis not present

## 2017-01-17 DIAGNOSIS — F72 Severe intellectual disabilities: Secondary | ICD-10-CM | POA: Diagnosis not present

## 2017-01-17 DIAGNOSIS — G40909 Epilepsy, unspecified, not intractable, without status epilepticus: Secondary | ICD-10-CM | POA: Diagnosis not present

## 2017-01-17 DIAGNOSIS — R509 Fever, unspecified: Secondary | ICD-10-CM | POA: Diagnosis not present

## 2017-01-17 DIAGNOSIS — K219 Gastro-esophageal reflux disease without esophagitis: Secondary | ICD-10-CM | POA: Diagnosis not present

## 2017-01-17 HISTORY — PX: RADIOLOGY WITH ANESTHESIA: SHX6223

## 2017-01-17 LAB — BASIC METABOLIC PANEL
Anion gap: 8 (ref 5–15)
BUN: 8 mg/dL (ref 6–20)
CHLORIDE: 105 mmol/L (ref 101–111)
CO2: 30 mmol/L (ref 22–32)
Calcium: 8.1 mg/dL — ABNORMAL LOW (ref 8.9–10.3)
Creatinine, Ser: 0.84 mg/dL (ref 0.61–1.24)
GFR calc Af Amer: >60 mL/min (ref >60)
GFR calc non Af Amer: >60 mL/min (ref >60)
GLUCOSE: 83 mg/dL (ref 65–99)
POTASSIUM: 3.4 mmol/L — AB (ref 3.5–5.1)
SODIUM: 143 mmol/L (ref 135–145)

## 2017-01-17 LAB — CBC
HEMATOCRIT: 37.1 % — AB (ref 39.0–52.0)
Hemoglobin: 11.5 g/dL — ABNORMAL LOW (ref 13.0–17.0)
MCH: 27 pg (ref 26.0–34.0)
MCHC: 31 g/dL (ref 30.0–36.0)
MCV: 87.1 fL (ref 78.0–100.0)
Platelets: 137 10*3/uL — ABNORMAL LOW (ref 150–400)
RBC: 4.26 MIL/uL (ref 4.22–5.81)
RDW: 13.3 % (ref 11.5–15.5)
WBC: 6.2 10*3/uL (ref 4.0–10.5)

## 2017-01-17 LAB — HEMOGLOBIN A1C
Hgb A1c MFr Bld: 5.1 % (ref 4.8–5.6)
Mean Plasma Glucose: 100 mg/dL

## 2017-01-17 SURGERY — RADIOLOGY WITH ANESTHESIA
Anesthesia: General

## 2017-01-17 SURGERY — Surgical Case
Anesthesia: *Unknown

## 2017-01-17 MED ORDER — LACTATED RINGERS IV SOLN
INTRAVENOUS | Status: DC
  Administered 2017-01-17: 12:00:00 via INTRAVENOUS

## 2017-01-17 MED ORDER — GADOBENATE DIMEGLUMINE 529 MG/ML IV SOLN
15.0000 mL | Freq: Once | INTRAVENOUS | Status: AC
  Administered 2017-01-17: 15 mL via INTRAVENOUS

## 2017-01-17 MED ORDER — FENTANYL CITRATE (PF) 100 MCG/2ML IJ SOLN: 25.0000 ug | INTRAMUSCULAR | Status: DC | PRN

## 2017-01-17 NOTE — Anesthesia Procedure Notes (Signed)
Procedure Name: Intubation Date/Time: 01/17/2017 12:28 PM Performed by: Salli Quarry Katricia Prehn Pre-anesthesia Checklist: Patient identified, Emergency Drugs available, Suction available and Patient being monitored Patient Re-evaluated:Patient Re-evaluated prior to inductionOxygen Delivery Method: Circle System Utilized Preoxygenation: Pre-oxygenation with 100% oxygen Intubation Type: IV induction Ventilation: Mask ventilation without difficulty Laryngoscope Size: Mac and 4 Grade View: Grade II Tube type: Oral Tube size: 7.5 mm Number of attempts: 1 Airway Equipment and Method: Stylet Placement Confirmation: ETT inserted through vocal cords under direct vision,  positive ETCO2 and breath sounds checked- equal and bilateral Secured at: 24 cm Tube secured with: Tape Dental Injury: Teeth and Oropharynx as per pre-operative assessment

## 2017-01-17 NOTE — Progress Notes (Signed)
SLP Cancellation Note  Patient Details Name: Marco [name] MRN: [id number] DOB: 06-01-1951   Cancelled treatment:        Pt in procedure when checked on earlier.    [name] 01/17/2017, 4:15 PM

## 2017-01-17 NOTE — Progress Notes (Signed)
Discharge to: [facility] Anticipated discharge date: 01/17/17 Family notified: Fabiola Backer, sister, by phone Transportation by: Car  CSW signing off.  Laveda Abbe LCSW [phone]

## 2017-01-17 NOTE — Progress Notes (Signed)
Placed on tele to monitor  

## 2017-01-17 NOTE — Discharge Summary (Signed)
Physician Discharge Summary  Marco [name] DQQ:[id number] DOB: [birth year] DOA: 01/14/2017  PCP: Marco Arnt, MD  Admit date: 01/14/2017 Discharge date: 01/17/2017   Recommendations for Outpatient Follow-Up:   1. Outpatient follow up with Dr. Inda [name]-- ? If chemo lowering seizure threshold   Discharge Diagnosis:   Active Problems:   Seizure [facility])   Discharge disposition:  Group home  Discharge Condition: Improved.  Diet recommendation: DYS diet  Wound care: None.   History of Present Illness:   Marco [name] is a 66 y.o. male  medical history significant of severe mental retardation, seizure disorder followed by Dr. Sarina [name], SCC of L. Lung on chemotherapy followed by Dr. Earlie [name]; patient had a seizure at his group home. EMS was activated. They give the patient Versed en route which stopped his tonic-clonic seizure. Patient was then brought to the emergency room for evaluation.  No history can be gathered from patient due to his severe mental retardation.   Hospital Course by Problem:   Recurrent seizures with H/O Seizure disorder: Acute.  - Seizure precautions and close monitoring for seizure-like activity -DYS diet - resume home meds -Neurology was consultedand is following MRI: No acute intracranial abnormality or metastatic disease Identified.  Mild cerebral atrophy  Fever and lactic acid Patient had a full tonic-clonic seizure prior to arrival Lactic acid was noted to be elevated as well as body temperature These are both likely due to patient having a seizure. Given vanc/zosyn x 1  Renal insufficiency: Acute. Baseline creatinine previously noted between 0.8-1. Patient presents with elevated creatinine of 1.2on admission. 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 S/p concurrent chemoradiation currently on Nivolumab 240 MG IV every 2 weeks(last infusion was 4/19). - Continue outpatient f/u  with oncology-- ? If chemo is lowering seizure threshold   Severe Mental retardation And h/o dementia  GERD - Protonix    Medical Consultants:    Neuro   Discharge Exam:   Vitals:   01/17/17 1358 01/17/17 1413  BP: 135/81 114/88  Pulse: 72   Resp: 16   Temp:     Vitals:   01/17/17 0944 01/17/17 1357 01/17/17 1358 01/17/17 1413  BP: 102/65  135/81 114/88  Pulse: 73  72   Resp: 19  16   Temp: 97.4 F (36.3 C) 97.2 F (36.2 C)    TempSrc: Axillary     SpO2: 100%  100%   Weight:      Height:        Gen:  NAD    The results of significant diagnostics from this hospitalization (including imaging, microbiology, ancillary and laboratory) are listed below for reference.     Procedures and Diagnostic Studies:   Dg Swallowing Func-speech Pathology  Result Date: 01/15/2017 Objective Swallowing Evaluation: Type of Study: Bedside Swallow Evaluation Patient Details Name: Marco [name] MRN: [id number] Date of Birth: 1951/04/10 Today's Date: 01/15/2017 Time: SLP Start Time (ACUTE ONLY): 1050-SLP Stop Time (ACUTE ONLY): 1110 SLP Time Calculation (min) (ACUTE ONLY): 20 min Past Medical History: No past medical history on file. Past Surgical History: No past surgical history on file. HPI: Marco Nosbisch Watkinsis a 66 y.o.malemedical history significant of severe intellectual disability, seizure disorder, SCC of L. Lung on chemotherapy followed by Dr. Earlie [name] admitted after having a seizure at his group home. CT attempted resulting in nondiagnostic head CT due to severe patient motion artifact.  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 Suspect some associated airspace collapse in this region given the configuration of the heart and left-sided volume loss, right lung remains clear. No Data Recorded Assessment / Plan / Recommendation CHL IP CLINICAL IMPRESSIONS 01/15/2017  Clinical Impression Pt exhibits mild oral and pharyngeal dysphagia marked by decreased epiglottic deflection and laryngeal closure. Laryngeal penetration (reflexive cough/throat clear majority of time) with thin and nectar intermittently (including flash, to vocal cords, subsequent swallows cleared). Pt with frequent eructation at bedside and during MBS with intermittently tight UES and slower cervical esophageal transit. Vallecular and pyriform sinus residue reduced with spontaneous swallows. Pt is very impulsive and is unable to modify sip size despite max verbal cues. There was no significant difference between thin and nectar re; penetration. No pna on CXR and pt admitted for seizure. Recommend Dys 3 texture (mechanical soft), thin liquids, no straws, tactile cues (remove cup from pt when needed), full supervision and crush meds.    SLP Visit Diagnosis Dysphagia, unspecified (R13.10) Attention and concentration deficit following -- Frontal lobe and executive function deficit following -- Impact on safety and function Moderate aspiration risk   CHL IP TREATMENT RECOMMENDATION 01/15/2017 Treatment Recommendations Therapy as outlined in treatment plan below   Prognosis 01/15/2017 Prognosis for Safe Diet Advancement Fair Barriers to Reach Goals Cognitive deficits Barriers/Prognosis Comment -- CHL IP DIET RECOMMENDATION 01/15/2017 SLP Diet Recommendations Dysphagia 3 (Mech soft) solids;Thin liquid Liquid Administration via Cup;No straw Medication Administration Crushed with puree Compensations Minimize environmental distractions;Slow rate;Small sips/bites Postural Changes Remain semi-upright after after feeds/meals (Comment)   CHL IP OTHER RECOMMENDATIONS 01/15/2017 Recommended Consults -- Oral Care Recommendations Oral care BID Other Recommendations --   CHL IP FOLLOW UP RECOMMENDATIONS 01/15/2017 Follow up Recommendations None   CHL IP FREQUENCY AND DURATION 01/15/2017 Speech Therapy Frequency (ACUTE ONLY) min 2x/week  Treatment Duration 2 weeks      CHL IP ORAL PHASE 01/15/2017 Oral Phase Impaired Oral - Pudding Teaspoon -- Oral - Pudding Cup -- Oral - Honey Teaspoon -- Oral - Honey Cup -- Oral - Nectar Teaspoon -- Oral - Nectar Cup WFL Oral - Nectar Straw -- Oral - Thin Teaspoon -- Oral - Thin Cup WFL Oral - Thin Straw -- Oral - Puree -- Oral - Mech Soft -- Oral - Regular Delayed oral transit Oral - Multi-Consistency -- Oral - Pill -- Oral Phase - Comment --  CHL IP PHARYNGEAL PHASE 01/15/2017 Pharyngeal Phase Impaired Pharyngeal- Pudding Teaspoon -- Pharyngeal -- Pharyngeal- Pudding Cup -- Pharyngeal -- Pharyngeal- Honey Teaspoon -- Pharyngeal -- Pharyngeal- Honey Cup -- Pharyngeal -- Pharyngeal- Nectar Teaspoon -- Pharyngeal -- Pharyngeal- Nectar Cup Pharyngeal residue - valleculae;Pharyngeal residue - pyriform;Reduced epiglottic inversion Pharyngeal -- Pharyngeal- Nectar Straw -- Pharyngeal -- Pharyngeal- Thin Teaspoon -- Pharyngeal -- Pharyngeal- Thin Cup Penetration/Aspiration during swallow;Pharyngeal residue - valleculae;Pharyngeal residue - pyriform;Reduced epiglottic inversion Pharyngeal Material enters airway, remains ABOVE vocal cords then ejected out;Material enters airway, CONTACTS cords and then ejected out;Material enters airway, remains ABOVE vocal cords and not ejected out Pharyngeal- Thin Straw Penetration/Aspiration during swallow Pharyngeal Material enters airway, remains ABOVE vocal cords then ejected out;Material enters airway, remains ABOVE vocal cords and not ejected out Pharyngeal- Puree -- Pharyngeal -- Pharyngeal- Mechanical Soft -- Pharyngeal -- Pharyngeal- Regular WFL Pharyngeal -- Pharyngeal- Multi-consistency -- Pharyngeal -- Pharyngeal- Pill -- Pharyngeal -- Pharyngeal Comment --  CHL IP CERVICAL ESOPHAGEAL PHASE 01/15/2017 Cervical Esophageal Phase Impaired Pudding Teaspoon -- Pudding  Cup -- Honey Teaspoon -- Honey Cup -- Nectar Teaspoon -- Nectar Cup -- Nectar Straw -- Thin Teaspoon -- Thin Cup --  Thin Straw -- Puree -- Mechanical Soft -- Regular -- Multi-consistency -- Pill -- Cervical Esophageal Comment -- CHL IP GO 01/15/2017 Functional Assessment Tool Used skilled clinical judgement Functional Limitations Swallowing Swallow Current Status (W1027) CK Swallow Goal Status (O5366) CJ Swallow Discharge Status (Y4034) CJ Motor Speech Current Status (V4259) (None) Motor Speech Goal Status (D6387) (None) Motor Speech Goal Status (F6433) (None) Spoken Language Comprehension Current Status (I9518) (None) Spoken Language Comprehension Goal Status (A4166) (None) Spoken Language Comprehension Discharge Status (A6301) (None) Spoken Language Expression Current Status (S0109) (None) Spoken Language Expression Goal Status (N2355) (None) Spoken Language Expression Discharge Status [phone]) (None) Attention Current Status (U5427) (None) Attention Goal Status (C6237) (None) Attention Discharge Status (S2831) (None) Memory Current Status (D1761) (None) Memory Goal Status (Y0737) (None) Memory Discharge Status (T0626) (None) Voice Current Status (R4854) (None) Voice Goal Status (O2703) (None) Voice Discharge Status (J0093) (None) Other Speech-Language Pathology Functional Limitation Current Status (G1829) (None) Other Speech-Language Pathology Functional Limitation Goal Status (H3716) (None) Other Speech-Language Pathology Functional Limitation Discharge Status [phone]) (None) [name] 01/15/2017, 12:02 PM [name] [name].Ed CCC-SLP Pager [phone]                Labs:   Basic Metabolic Panel:  Recent Labs Lab 01/14/17 1257 01/14/17 1319 01/17/17 0605  NA 139 141 143  K 3.6 3.6 3.4*  CL 104 106 105  CO2 20*  --  30  GLUCOSE 195* 189* 83  BUN 18 22* 8  CREATININE 1.47* 1.20 0.84  CALCIUM 9.1  --  8.1*  MG 2.0  --   --   PHOS 3.5  --   --    GFR Estimated Creatinine Clearance: 91.8 mL/min (by C-G formula based on SCr of 0.84 mg/dL). Liver Function Tests:  Recent Labs Lab 01/14/17 1257    AST 21  ALT 10*  ALKPHOS 64  BILITOT 0.3  PROT 7.5  ALBUMIN 3.6   No results for input(s): LIPASE, AMYLASE in the last 168 hours.  Recent Labs Lab 01/14/17 1306 01/15/17 0527  AMMONIA 54* 16   Coagulation profile No results for input(s): INR, PROTIME in the last 168 hours.  CBC:  Recent Labs Lab 01/14/17 1257 01/14/17 1319 01/17/17 0605  WBC 11.3*  --  6.2  NEUTROABS 10.0*  --   --   HGB 13.1 13.9 11.5*  HCT 41.7 41.0 37.1*  MCV 88.7  --  87.1  PLT 179  --  137*   Cardiac Enzymes: No results for input(s): CKTOTAL, CKMB, CKMBINDEX, TROPONINI in the last 168 hours. BNP: Invalid input(s): POCBNP CBG:  Recent Labs Lab 01/14/17 1254  GLUCAP 187*   D-Dimer No results for input(s): DDIMER in the last 72 hours. Hgb A1c  Recent Labs  01/16/17 1428  HGBA1C 5.1   Lipid Profile No results for input(s): CHOL, HDL, LDLCALC, TRIG, CHOLHDL, LDLDIRECT in the last 72 hours. Thyroid function studies No results for input(s): TSH, T4TOTAL, T3FREE, THYROIDAB in the last 72 hours.  Invalid input(s): FREET3 Anemia work up No results for input(s): VITAMINB12, FOLATE, FERRITIN, TIBC, IRON, RETICCTPCT in the last 72 hours. Microbiology Recent Results (from the past 240 hour(s))  Urine culture     Status: None   Collection Time: 01/14/17  1:20 PM  Result Value Ref Range Status   Specimen Description URINE, CATHETERIZED  Final  Special Requests NONE  Final   Culture NO GROWTH  Final   Report Status 01/15/2017 FINAL  Final  Blood culture (routine x 2)     Status: None (Preliminary result)   Collection Time: 01/14/17  1:27 PM  Result Value Ref Range Status   Specimen Description BLOOD LEFT HAND  Final   Special Requests IN PEDIATRIC BOTTLE Blood Culture adequate volume  Final   Culture NO GROWTH 2 DAYS  Final   Report Status PENDING  Incomplete  Blood culture (routine x 2)     Status: None (Preliminary result)   Collection Time: 01/14/17  1:37 PM  Result Value Ref  Range Status   Specimen Description BLOOD RIGHT ANTECUBITAL  Final   Special Requests   Final    BOTTLES DRAWN AEROBIC AND ANAEROBIC Blood Culture adequate volume   Culture NO GROWTH 2 DAYS  Final   Report Status PENDING  Incomplete     Discharge Instructions:   Discharge Instructions    Discharge instructions    Complete by:  As directed    Resume DYS diet   Increase activity slowly    Complete by:  As directed      Allergies as of 01/17/2017   No Known Allergies     Medication List    TAKE these medications   acetaminophen 500 MG tablet Commonly known as:  TYLENOL Take 500 mg by mouth every 4 (four) hours as needed (headache/pain).   bisacodyl 5 MG EC tablet Commonly known as:  DULCOLAX Take 5 mg by mouth daily as needed (constipation).   diazepam 10 MG Gel Commonly known as:  DIASTAT ACUDIAL Place 10 mg rectally once as needed for seizure (may repeat in 4-6 hours once).   divalproex 250 MG 24 hr tablet Commonly known as:  DEPAKOTE ER Take 750 mg by mouth 2 (two) times daily.   donepezil 10 MG tablet Commonly known as:  ARICEPT Take 10 mg by mouth at bedtime.   fluticasone 50 MCG/ACT nasal spray Commonly known as:  FLONASE Place 1 spray into both nostrils daily.   HYDROMET 5-1.5 MG/5ML syrup Generic drug:  HYDROcodone-homatropine Take 5 mLs by mouth every 6 (six) hours as needed for cough.   IMODIUM A-D 2 MG tablet Generic drug:  loperamide Take 2 mg by mouth every 4 (four) hours as needed for diarrhea or loose stools.   ketoconazole 2 % cream Commonly known as:  NIZORAL Apply 1 application topically See admin instructions. Apply to feet twice a day as needed for rash/itching   lacosamide 200 MG Tabs tablet Commonly known as:  VIMPAT Take 200 mg by mouth 2 (two) times daily.   levETIRAcetam 500 MG 24 hr tablet Commonly known as:  KEPPRA XR Take 1,500 mg by mouth 2 (two) times daily.   LORazepam 1 MG tablet Commonly known as:  ATIVAN Take 2 mg  by mouth at bedtime.   omeprazole 20 MG capsule Commonly known as:  PRILOSEC Take 20 mg by mouth 2 (two) times daily.   polyethylene glycol packet Commonly known as:  MIRALAX / GLYCOLAX Take 17 g by mouth daily as needed (constipation). Mix in 8 oz water or juice and drink   prochlorperazine 10 MG tablet Commonly known as:  COMPAZINE Take 10 mg by mouth every 6 (six) hours as needed for vomiting.   risperiDONE 1 MG tablet Commonly known as:  RISPERDAL Take 1 mg by mouth at bedtime.   traZODone 50 MG tablet Commonly known as:  DESYREL Take 25 mg by mouth at bedtime as needed for sleep.      Follow-up Information    Marco Arnt, MD Follow up in 1 week(s).   Specialty:  Family Medicine Contact information: [place] [address] [phone]        Melvenia Beam, MD Follow up in 2 week(s).   Specialty:  Neurology Contact information: [address] [place] [address] [phone]        Curt Bears, MD Follow up in 2 week(s).   Specialty:  Oncology Why:  ? if chemotherapy is lowering seziure threshold Contact information: [place] [address] [phone]            Time coordinating discharge: 35 min  Signed:  [place]   [facility] 01/17/2017, 3:14 PM

## 2017-01-17 NOTE — Progress Notes (Signed)
Discharge orders received.  Discharge instructions and follow-up appointments reviewed with the patient and his caregiver.  VSS upon discharge.  IV removed and education complete.  All belongings sent with the patient.  Transported out via wheelchair.  Cori Razor, RN

## 2017-01-17 NOTE — Anesthesia Preprocedure Evaluation (Addendum)
Anesthesia Evaluation  Patient identified by MRN, date of birth, ID band Patient awake    Reviewed: Allergy & Precautions, H&P , Patient's Chart, lab work & pertinent test results, reviewed documented beta blocker date and time   Airway Mallampati: II  TM Distance: >3 FB Neck ROM: full    Dental no notable dental hx. (+) Poor Dentition   Pulmonary    Pulmonary exam normal breath sounds clear to auscultation       Cardiovascular  Rhythm:regular Rate:Normal     Neuro/Psych    GI/Hepatic   Endo/Other    Renal/GU      Musculoskeletal   Abdominal   Peds  Hematology   Anesthesia Other Findings   Reproductive/Obstetrics                            Anesthesia Physical Anesthesia Plan  ASA: II  Anesthesia Plan: General   Post-op Pain Management:    Induction: Intravenous  Airway Management Planned: Oral ETT  Additional Equipment:   Intra-op Plan:   Post-operative Plan: Extubation in OR  Informed Consent: I have reviewed the patients History and Physical, chart, labs and discussed the procedure including the risks, benefits and alternatives for the proposed anesthesia with the patient or authorized representative who has indicated his/her understanding and acceptance.   Dental Advisory Given  Plan Discussed with: CRNA and Surgeon  Anesthesia Plan Comments: ( )        Anesthesia Quick Evaluation

## 2017-01-17 NOTE — Transfer of Care (Signed)
Immediate Anesthesia Transfer of Care Note  Patient: Marco [name]  Procedure(s) Performed: Procedure(s): M R BRAIN WITH AND WITHOUT CONTRAST (N/A)  Patient Location: PACU  Anesthesia Type:General  Level of Consciousness: drowsy and responds to stimulation  Airway & Oxygen Therapy: Patient Spontanous Breathing and Patient connected to nasal cannula oxygen  Post-op Assessment: Report given to RN and Post -op Vital signs reviewed and stable  Post vital signs: Reviewed and stable  Last Vitals:  Vitals:   01/17/17 1357 01/17/17 1358  BP:  135/81  Pulse:  72  Resp:  16  Temp: 36.2 C     Last Pain:  Vitals:   01/17/17 1357  TempSrc:   PainSc: 0-No pain         Complications: No apparent anesthesia complications

## 2017-01-17 NOTE — Progress Notes (Signed)
PROGRESS NOTE    Marco [name]  NIO:[id number] DOB: 09-27-1950 DOA: 01/14/2017 PCP: Marco Arnt, MD   Outpatient Specialists:     Brief Narrative:  Marco [name] is a 66 y.o. male  medical history significant of severe mental retardation, seizure disorder followed by Dr. Sarina Ill, [facility] on chemotherapy followed by Dr. Earlie Server; patient had a seizure at his group home. EMS was activated. They give the patient Versed en route which stopped his tonic-clonic seizure. Patient was then brought to the emergency room for evaluation.   Assessment & Plan:   Active Problems:   Seizure ([employer])   Recurrent seizures with H/O Seizure disorder: Acute.  - Seizure precautions and close monitoring for seizure-like activity -DYS diet - resume home meds -Neurology was consulted and is following MRI ordered with sedation by neurology- today at 12PM  Fever and lactic acid Patient had a full tonic-clonic seizure prior to arrival Lactic acid was noted to be elevated as well as body temperature These are both likely due to patient having a seizure. Given vanc/zosyn x 1 -watch for signs of aspiration PNA-- no further fever  Renal insufficiency: Acute. Baseline creatinine previously noted between 0.8-1. Patient presents with elevated creatinine of 1.2 on admission. 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 S/p concurrent chemoradiation currently on Nivolumab 240 MG IV every 2 weeks(last infusion was 4/19). - Continue outpatient f/u with oncology -added [name] to treatment team  Severe Mental retardation And h/o dementia - Set bed alarm on - Sitter to bedside  GERD - Protonix   DVT prophylaxis:  ambulate  Code Status: Full Code   Family Communication: Sitter at bedside  Disposition Plan:     Consultants:   neuro     Subjective: No further seizures  Objective: Vitals:   01/16/17 1442 01/16/17  2100 01/17/17 0231 01/17/17 0600  BP: 107/68 110/77 102/72 110/76  Pulse: 76 87 77 72  Resp: '18 18 16 18  '$ Temp: 98.2 F (36.8 C) 98.3 F (36.8 C) 97.5 F (36.4 C) 97.3 F (36.3 C)  TempSrc: Oral Oral Axillary Axillary  SpO2: 99% 98% 97% 98%  Weight:      Height:        Intake/Output Summary (Last 24 hours) at 01/17/17 0841 Last data filed at 01/17/17 0604  Gross per 24 hour  Intake             1535 ml  Output              750 ml  Net              785 ml   Filed Weights   01/14/17 1251  Weight: 77.1 kg (170 lb)    Examination:  General exam: in bed laughing - speech difficult to understand- sitter at bedside Respiratory system: no wheezing Cardiovascular system: S1 & S2 heard, RRR. No JVD, murmurs, rubs, gallops or clicks. No pedal edema. Gastrointestinal system: +BS Central nervous system: No focal neurological deficits.      Data Reviewed: I have personally reviewed following labs and imaging studies  CBC:  Recent Labs Lab 01/14/17 1257 01/14/17 1319 01/17/17 0605  WBC 11.3*  --  6.2  NEUTROABS 10.0*  --   --   HGB 13.1 13.9 11.5*  HCT 41.7 41.0 37.1*  MCV 88.7  --  87.1  PLT 179  --  818*   Basic Metabolic Panel:  Recent  Labs Lab 01/14/17 1257 01/14/17 1319 01/17/17 0605  NA 139 141 143  K 3.6 3.6 3.4*  CL 104 106 105  CO2 20*  --  30  GLUCOSE 195* 189* 83  BUN 18 22* 8  CREATININE 1.47* 1.20 0.84  CALCIUM 9.1  --  8.1*  MG 2.0  --   --   PHOS 3.5  --   --    GFR: Estimated Creatinine Clearance: 91.8 mL/min (by C-G formula based on SCr of 0.84 mg/dL). Liver Function Tests:  Recent Labs Lab 01/14/17 1257  AST 21  ALT 10*  ALKPHOS 64  BILITOT 0.3  PROT 7.5  ALBUMIN 3.6   No results for input(s): LIPASE, AMYLASE in the last 168 hours.  Recent Labs Lab 01/14/17 1306 01/15/17 0527  AMMONIA 54* 16   Coagulation Profile: No results for input(s): INR, PROTIME in the last 168 hours. Cardiac Enzymes: No results for input(s):  CKTOTAL, CKMB, CKMBINDEX, TROPONINI in the last 168 hours. BNP (last 3 results) No results for input(s): PROBNP in the last 8760 hours. HbA1C:  Recent Labs  01/16/17 1428  HGBA1C 5.1   CBG:  Recent Labs Lab 01/14/17 1254  GLUCAP 187*   Lipid Profile: No results for input(s): CHOL, HDL, LDLCALC, TRIG, CHOLHDL, LDLDIRECT in the last 72 hours. Thyroid Function Tests: No results for input(s): TSH, T4TOTAL, FREET4, T3FREE, THYROIDAB in the last 72 hours. Anemia Panel: No results for input(s): VITAMINB12, FOLATE, FERRITIN, TIBC, IRON, RETICCTPCT in the last 72 hours. Urine analysis:    Component Value Date/Time   COLORURINE YELLOW 01/14/2017 1320   APPEARANCEUR HAZY (A) 01/14/2017 1320   LABSPEC 1.020 01/14/2017 1320   PHURINE 5.0 01/14/2017 1320   GLUCOSEU NEGATIVE 01/14/2017 1320   HGBUR NEGATIVE 01/14/2017 1320   BILIRUBINUR NEGATIVE 01/14/2017 1320   KETONESUR NEGATIVE 01/14/2017 1320   PROTEINUR 30 (A) 01/14/2017 1320   NITRITE NEGATIVE 01/14/2017 1320   LEUKOCYTESUR NEGATIVE 01/14/2017 1320      Recent Results (from the past 240 hour(s))  Urine culture     Status: None   Collection Time: 01/14/17  1:20 PM  Result Value Ref Range Status   Specimen Description URINE, CATHETERIZED  Final   Special Requests NONE  Final   Culture NO GROWTH  Final   Report Status 01/15/2017 FINAL  Final  Blood culture (routine x 2)     Status: None (Preliminary result)   Collection Time: 01/14/17  1:27 PM  Result Value Ref Range Status   Specimen Description BLOOD LEFT HAND  Final   Special Requests IN PEDIATRIC BOTTLE Blood Culture adequate volume  Final   Culture NO GROWTH 2 DAYS  Final   Report Status PENDING  Incomplete  Blood culture (routine x 2)     Status: None (Preliminary result)   Collection Time: 01/14/17  1:37 PM  Result Value Ref Range Status   Specimen Description BLOOD RIGHT ANTECUBITAL  Final   Special Requests   Final    BOTTLES DRAWN AEROBIC AND ANAEROBIC  Blood Culture adequate volume   Culture NO GROWTH 2 DAYS  Final   Report Status PENDING  Incomplete      Anti-infectives    Start     Dose/Rate Route Frequency Ordered Stop   01/14/17 1330  vancomycin (VANCOCIN) 1,500 mg in sodium chloride 0.9 % 500 mL IVPB     1,500 mg 250 mL/hr over 120 Minutes Intravenous  Once 01/14/17 1321 01/14/17 1604   01/14/17 1330  piperacillin-tazobactam (ZOSYN)  IVPB 4.5 g     4.5 g 200 mL/hr over 30 Minutes Intravenous  Once 01/14/17 1321 01/14/17 1651       Radiology Studies: Dg Swallowing Func-speech Pathology  Result Date: 01/15/2017 Objective Swallowing Evaluation: Type of Study: Bedside Swallow Evaluation Patient Details Name: TRELON PLUSH MRN: [id number] Date of Birth: [birth year] Today's Date: 01/15/2017 Time: SLP Start Time (ACUTE ONLY): 1050-SLP Stop Time (ACUTE ONLY): 1110 SLP Time Calculation (min) (ACUTE ONLY): 20 min Past Medical History: No past medical history on file. Past Surgical History: No past surgical history on file. HPI: Azarian Starace Watkinsis a 66 y.o.malemedical history significant of severe intellectual disability, seizure disorder, [facility] on chemotherapy followed by Dr. Earlie Server admitted after having a seizure at his group home. CT attempted resulting in nondiagnostic head CT due to severe patient motion artifact.  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 Suspect some associated airspace collapse in this region given the configuration of the heart and left-sided volume loss, right lung remains clear. No Data Recorded Assessment / Plan / Recommendation CHL IP CLINICAL IMPRESSIONS 01/15/2017 Clinical Impression Pt exhibits mild oral and pharyngeal dysphagia marked by decreased epiglottic deflection and laryngeal closure. Laryngeal penetration (reflexive cough/throat clear majority of time) with thin and nectar  intermittently (including flash, to vocal cords, subsequent swallows cleared). Pt with frequent eructation at bedside and during MBS with intermittently tight UES and slower cervical esophageal transit. Vallecular and pyriform sinus residue reduced with spontaneous swallows. Pt is very impulsive and is unable to modify sip size despite max verbal cues. There was no significant difference between thin and nectar re; penetration. No pna on CXR and pt admitted for seizure. Recommend Dys 3 texture (mechanical soft), thin liquids, no straws, tactile cues (remove cup from pt when needed), full supervision and crush meds.    SLP Visit Diagnosis Dysphagia, unspecified (R13.10) Attention and concentration deficit following -- Frontal lobe and executive function deficit following -- Impact on safety and function Moderate aspiration risk   CHL IP TREATMENT RECOMMENDATION 01/15/2017 Treatment Recommendations Therapy as outlined in treatment plan below   Prognosis 01/15/2017 Prognosis for Safe Diet Advancement Fair Barriers to Reach Goals Cognitive deficits Barriers/Prognosis Comment -- CHL IP DIET RECOMMENDATION 01/15/2017 SLP Diet Recommendations Dysphagia 3 (Mech soft) solids;Thin liquid Liquid Administration via Cup;No straw Medication Administration Crushed with puree Compensations Minimize environmental distractions;Slow rate;Small sips/bites Postural Changes Remain semi-upright after after feeds/meals (Comment)   CHL IP OTHER RECOMMENDATIONS 01/15/2017 Recommended Consults -- Oral Care Recommendations Oral care BID Other Recommendations --   CHL IP FOLLOW UP RECOMMENDATIONS 01/15/2017 Follow up Recommendations None   CHL IP FREQUENCY AND DURATION 01/15/2017 Speech Therapy Frequency (ACUTE ONLY) min 2x/week Treatment Duration 2 weeks      CHL IP ORAL PHASE 01/15/2017 Oral Phase Impaired Oral - Pudding Teaspoon -- Oral - Pudding Cup -- Oral - Honey Teaspoon -- Oral - Honey Cup -- Oral - Nectar Teaspoon -- Oral - Nectar Cup WFL  Oral - Nectar Straw -- Oral - Thin Teaspoon -- Oral - Thin Cup WFL Oral - Thin Straw -- Oral - Puree -- Oral - Mech Soft -- Oral - Regular Delayed oral transit Oral - Multi-Consistency -- Oral - Pill -- Oral Phase - Comment --  CHL IP PHARYNGEAL PHASE 01/15/2017 Pharyngeal Phase Impaired Pharyngeal- Pudding Teaspoon -- Pharyngeal -- Pharyngeal- Pudding Cup -- Pharyngeal -- Pharyngeal- Honey Teaspoon --  Pharyngeal -- Pharyngeal- Honey Cup -- Pharyngeal -- Pharyngeal- Nectar Teaspoon -- Pharyngeal -- Pharyngeal- Nectar Cup Pharyngeal residue - valleculae;Pharyngeal residue - pyriform;Reduced epiglottic inversion Pharyngeal -- Pharyngeal- Nectar Straw -- Pharyngeal -- Pharyngeal- Thin Teaspoon -- Pharyngeal -- Pharyngeal- Thin Cup Penetration/Aspiration during swallow;Pharyngeal residue - valleculae;Pharyngeal residue - pyriform;Reduced epiglottic inversion Pharyngeal Material enters airway, remains ABOVE vocal cords then ejected out;Material enters airway, CONTACTS cords and then ejected out;Material enters airway, remains ABOVE vocal cords and not ejected out Pharyngeal- Thin Straw Penetration/Aspiration during swallow Pharyngeal Material enters airway, remains ABOVE vocal cords then ejected out;Material enters airway, remains ABOVE vocal cords and not ejected out Pharyngeal- Puree -- Pharyngeal -- Pharyngeal- Mechanical Soft -- Pharyngeal -- Pharyngeal- Regular WFL Pharyngeal -- Pharyngeal- Multi-consistency -- Pharyngeal -- Pharyngeal- Pill -- Pharyngeal -- Pharyngeal Comment --  CHL IP CERVICAL ESOPHAGEAL PHASE 01/15/2017 Cervical Esophageal Phase Impaired Pudding Teaspoon -- Pudding Cup -- Honey Teaspoon -- Honey Cup -- Nectar Teaspoon -- Nectar Cup -- Nectar Straw -- Thin Teaspoon -- Thin Cup -- Thin Straw -- Puree -- Mechanical Soft -- Regular -- Multi-consistency -- Pill -- Cervical Esophageal Comment -- CHL IP GO 01/15/2017 Functional Assessment Tool Used skilled clinical judgement Functional Limitations  Swallowing Swallow Current Status (V2003) CK Swallow Goal Status (L9444) CJ Swallow Discharge Status (Q1901) CJ Motor Speech Current Status (Q2241) (None) Motor Speech Goal Status (H4643) (None) Motor Speech Goal Status (X4276) (None) Spoken Language Comprehension Current Status (R0110) (None) Spoken Language Comprehension Goal Status (Y3496) (None) Spoken Language Comprehension Discharge Status (L1643) (None) Spoken Language Expression Current Status (D3912) (None) Spoken Language Expression Goal Status (Q5834) (None) Spoken Language Expression Discharge Status (M2194) (None) Attention Current Status (F1252) (None) Attention Goal Status (V1292) (None) Attention Discharge Status (T0903) (None) Memory Current Status (O1499) (None) Memory Goal Status (U9249) (None) Memory Discharge Status (J2419) (None) Voice Current Status (R1444) (None) Voice Goal Status (P8483) (None) Voice Discharge Status (T0757) (None) Other Speech-Language Pathology Functional Limitation Current Status (B2256) (None) Other Speech-Language Pathology Functional Limitation Goal Status (H2091) (None) Other Speech-Language Pathology Functional Limitation Discharge Status [phone]) (None) [name] 01/15/2017, 12:02 PM [name] [name].Ed CCC-SLP Pager [phone]                   Scheduled Meds: . donepezil  10 mg Oral QHS  . fluticasone  1 spray Each Nare Daily  . heparin  5,000 Units Subcutaneous Q8H  . LORazepam  2 mg Oral QHS  . pantoprazole (PROTONIX) IV  40 mg Intravenous QHS  . risperiDONE  1 mg Oral QHS   Continuous Infusions: . lacosamide (VIMPAT) IV Stopped (01/17/17 0042)  . levETIRAcetam Stopped (01/17/17 0000)  . valproate sodium Stopped (01/16/17 2329)     LOS: 2 days    Time spent: 25 min    [place], DO [facility] Pager [phone]  If 7PM-7AM, please contact night-coverage [email or web address] Password [facility] 01/17/2017, 8:41 AM

## 2017-01-18 ENCOUNTER — Telehealth: Payer: Self-pay | Admitting: Medical Oncology

## 2017-01-18 ENCOUNTER — Encounter: Payer: Self-pay | Admitting: Internal Medicine

## 2017-01-18 ENCOUNTER — Ambulatory Visit: Payer: Medicare Other

## 2017-01-18 ENCOUNTER — Other Ambulatory Visit (HOSPITAL_BASED_OUTPATIENT_CLINIC_OR_DEPARTMENT_OTHER): Payer: Medicare Other

## 2017-01-18 ENCOUNTER — Ambulatory Visit (HOSPITAL_BASED_OUTPATIENT_CLINIC_OR_DEPARTMENT_OTHER): Payer: Medicare Other | Admitting: Internal Medicine

## 2017-01-18 VITALS — BP 97/71 | HR 94 | Resp 18 | Ht 72.0 in | Wt 123.7 lb

## 2017-01-18 DIAGNOSIS — R530 Neoplastic (malignant) related fatigue: Secondary | ICD-10-CM

## 2017-01-18 DIAGNOSIS — C3492 Malignant neoplasm of unspecified part of left bronchus or lung: Secondary | ICD-10-CM

## 2017-01-18 DIAGNOSIS — Z5112 Encounter for antineoplastic immunotherapy: Secondary | ICD-10-CM

## 2017-01-18 DIAGNOSIS — R531 Weakness: Secondary | ICD-10-CM | POA: Diagnosis not present

## 2017-01-18 LAB — COMPREHENSIVE METABOLIC PANEL
ALT: 6 U/L (ref 0–55)
AST: 10 U/L (ref 5–34)
Albumin: 3 g/dL — ABNORMAL LOW (ref 3.5–5.0)
Alkaline Phosphatase: 70 U/L (ref 40–150)
Anion Gap: 8 mEq/L (ref 3–11)
BUN: 7.4 mg/dL (ref 7.0–26.0)
CHLORIDE: 104 meq/L (ref 98–109)
CO2: 32 meq/L — AB (ref 22–29)
CREATININE: 0.9 mg/dL (ref 0.7–1.3)
Calcium: 8.4 mg/dL (ref 8.4–10.4)
EGFR: >90 mL/min/{1.73_m2} (ref >90)
GLUCOSE: 97 mg/dL (ref 70–140)
POTASSIUM: 3.5 meq/L (ref 3.5–5.1)
Sodium: 143 mEq/L (ref 136–145)
Total Bilirubin: 0.27 mg/dL (ref 0.20–1.20)
Total Protein: 6.5 g/dL (ref 6.4–8.3)

## 2017-01-18 LAB — CBC WITH DIFFERENTIAL/PLATELET
BASO%: 0.2 % (ref 0.0–2.0)
BASOS ABS: 0 10*3/uL (ref 0.0–0.1)
EOS%: 0.7 % (ref 0.0–7.0)
Eosinophils Absolute: 0 10*3/uL (ref 0.0–0.5)
HEMATOCRIT: 40.1 % (ref 38.4–49.9)
HGB: 12.6 g/dL — ABNORMAL LOW (ref 13.0–17.1)
LYMPH#: 0.8 10*3/uL — AB (ref 0.9–3.3)
LYMPH%: 15.3 % (ref 14.0–49.0)
MCH: 27.9 pg (ref 27.2–33.4)
MCHC: 31.4 g/dL — AB (ref 32.0–36.0)
MCV: 88.7 fL (ref 79.3–98.0)
MONO#: 0.5 10*3/uL (ref 0.1–0.9)
MONO%: 8.7 % (ref 0.0–14.0)
NEUT#: 4.1 10*3/uL (ref 1.5–6.5)
NEUT%: 75.1 % — AB (ref 39.0–75.0)
Platelets: 159 10*3/uL (ref 140–400)
RBC: 4.52 10*6/uL (ref 4.20–5.82)
RDW: 13.5 % (ref 11.0–14.6)
WBC: 5.4 10*3/uL (ref 4.0–10.3)

## 2017-01-18 MED FILL — Lactated Ringer's Solution: INTRAVENOUS | Qty: 1000 | Status: AC

## 2017-01-18 MED FILL — Propofol IV Emul 200 MG/20ML (10 MG/ML): INTRAVENOUS | Qty: 9 | Status: AC

## 2017-01-18 MED FILL — Ondansetron HCl Inj 4 MG/2ML (2 MG/ML): INTRAMUSCULAR | Qty: 2 | Status: AC

## 2017-01-18 MED FILL — Midazolam HCl Inj 2 MG/2ML (Base Equivalent): INTRAMUSCULAR | Qty: 2 | Status: AC

## 2017-01-18 MED FILL — Fentanyl Citrate Preservative Free (PF) Inj 100 MCG/2ML: INTRAMUSCULAR | Qty: 1 | Status: AC

## 2017-01-18 MED FILL — Phenylephrine-NaCl IV Solution 10 MG/250ML-0.9%: INTRAVENOUS | Qty: 250 | Status: AC

## 2017-01-18 MED FILL — Ephedrine Sulf-NaCl Soln Pref Syr 50 MG/10ML-0.9% (5 MG/ML): INTRAVENOUS | Qty: 10 | Status: AC

## 2017-01-18 MED FILL — Succinylcholine Chloride Inj 20 MG/ML: INTRAMUSCULAR | Qty: 6 | Status: AC

## 2017-01-18 NOTE — Telephone Encounter (Signed)
Pt being released form hospital today after seizure ., cannot come tomorrow.

## 2017-01-18 NOTE — Anesthesia Postprocedure Evaluation (Signed)
Anesthesia Post Note  Patient: LEW PROUT  Procedure(s) Performed: Procedure(s) (LRB): M R BRAIN WITH AND WITHOUT CONTRAST (N/A)  Patient location during evaluation: PACU Anesthesia Type: General Level of consciousness: awake and alert Pain management: pain level controlled Vital Signs Assessment: post-procedure vital signs reviewed and stable Respiratory status: spontaneous breathing, nonlabored ventilation, respiratory function stable and patient connected to nasal cannula oxygen Cardiovascular status: blood pressure returned to baseline and stable Postop Assessment: no signs of nausea or vomiting Anesthetic complications: no       Last Vitals:  Vitals:   01/17/17 1358 01/17/17 1413  BP: 135/81 114/88  Pulse: 72   Resp: 16   Temp:      Last Pain:  Vitals:   01/17/17 1357  TempSrc:   PainSc: 0-No pain                 Nancy Manuele EDWARD

## 2017-01-18 NOTE — Progress Notes (Signed)
[facility] [phone]) [phone]   [phone]  OFFICE PROGRESS NOTE  Leamon Arnt, MD [address] [place] [address]  DIAGNOSIS: Stage IIA (T2a., N1, M0) non-small cell lung cancer, squamous cell carcinoma presented with central left upper lobe mass as well as hilar lymphadenopathy diagnosed in January of 2015.  PRIOR THERAPY:  1. Concurrent chemoradiation with weekly carboplatin for AUC of 2 and paclitaxel 45 mg/M2, status post 7 cycles. Last cycle was given 12/22/2013  2. Systemic chemotherapy with carboplatin for AUC of 5 and paclitaxel 175 MG/M2 with Neulasta support every 3 weeks. Status post 3 cycles. 3.  Immunotherapy with Nivolumab 3 mg/kg given every 2 weeks. Status post 9 cycles. Discontinued secondary to noncompliance but the patient has a stable disease.  CURRENT THERAPY: Treatment again with immunotherapy with Nivolumab 240 MG IV every 2 weeks. First dose 12/07/2016. Status post 3 cycles.  INTERVAL HISTORY: Marco [name] 66 y.o. male returns to the clinic today for follow-up visit accompanied by his caregiver. The patient has more fatigue and weakness recently. He was admitted to the hospital with seizure activity and imaging studies showed no metastatic disease to the brain. He was seen by neurology and he increased his dose of Depakote. He is feeling a little bit better. He denied having any chest pain or shortness breath but continues to have mild cough with no hemoptysis. He has no recent weight loss or night sweats. He has no nausea, vomiting, diarrhea or constipation. He is here today for evaluation before resuming his treatment with immunotherapy.  MEDICAL HISTORY: Past Medical History:  Diagnosis Date  . DEMENTIA   . Depression   . Developmental disability    developmentaly delayed  . Encounter for antineoplastic chemotherapy 10/10/2016  . Encounter for antineoplastic immunotherapy 04/05/2015  . Encounter for  antineoplastic immunotherapy 12/21/2016  . GERD (gastroesophageal reflux disease)   . Hx of radiation therapy 11/07/13-12/24/13   lung,60Gy/74f  . Hypertension    no medications, no documented history per caregiver at preadmission  . Lung cancer ([facility]   . Mental disorder    sczizophrenia;moderate retardation  . Mental retardation   . Seizure disorder ([facility]   . Seizures ([facility])     ALLERGIES:  has No Known Allergies.  MEDICATIONS:  Current Outpatient Prescriptions  Medication Sig Dispense Refill  . acetaminophen (TYLENOL) 500 MG tablet Take 500 mg by mouth every 4 (four) hours as needed for mild pain or headache.     .Marland Kitchenacetaminophen (TYLENOL) 500 MG tablet Take 500 mg by mouth every 4 (four) hours as needed (headache/pain).    . bisacodyl (DULCOLAX) 5 MG EC tablet Take 5 mg by mouth daily as needed for mild constipation.     . bisacodyl (DULCOLAX) 5 MG EC tablet Take 5 mg by mouth daily as needed (constipation).    . diazepam (DIASTAT ACUDIAL) 10 MG GEL Place 5 mg rectally once as needed for seizure.    . diazepam (DIASTAT ACUDIAL) 10 MG GEL Place 10 mg rectally once as needed for seizure (may repeat in 4-6 hours once).    . divalproex (DEPAKOTE ER) 250 MG 24 hr tablet Take 3 tablets (750 mg total) by mouth 2 (two) times daily. 180 tablet 0  . divalproex (DEPAKOTE ER) 250 MG 24 hr tablet Take 750 mg by mouth 2 (two) times daily.    .Marland Kitchendonepezil (ARICEPT) 10 MG tablet Take 10 mg by mouth at bedtime.     .Marland Kitchen  donepezil (ARICEPT) 10 MG tablet Take 10 mg by mouth at bedtime.    . fluticasone (FLONASE) 50 MCG/ACT nasal spray Place 2 sprays into both nostrils daily.    . fluticasone (FLONASE) 50 MCG/ACT nasal spray Place 1 spray into both nostrils daily.    Marland Kitchen HYDROcodone-homatropine (HYCODAN) 5-1.5 MG/5ML syrup Take 5 mLs by mouth every 6 (six) hours as needed for cough. 120 mL 0  . HYDROcodone-homatropine (HYDROMET) 5-1.5 MG/5ML syrup Take 5 mLs by mouth every 6 (six) hours as needed for cough.      Marland Kitchen ketoconazole (NIZORAL) 2 % cream Apply 1 application topically 2 (two) times daily as needed for irritation. Pt applies to buttocks.     Marland Kitchen ketoconazole (NIZORAL) 2 % cream Apply 1 application topically See admin instructions. Apply to feet twice a day as needed for rash/itching    . lacosamide (VIMPAT) 200 MG TABS tablet Take 1 tablet (200 mg total) by mouth 2 (two) times daily. 60 tablet 1  . lacosamide (VIMPAT) 200 MG TABS tablet Take 200 mg by mouth 2 (two) times daily.    Marland Kitchen levETIRAcetam (KEPPRA XR) 500 MG 24 hr tablet Take 3 tablets (1,500 mg total) by mouth 2 (two) times daily. 180 tablet 11  . levETIRAcetam (KEPPRA XR) 500 MG 24 hr tablet Take 1,500 mg by mouth 2 (two) times daily.    Marland Kitchen loperamide (IMODIUM A-D) 2 MG tablet Take 2 mg by mouth every 4 (four) hours as needed for diarrhea or loose stools.    Marland Kitchen loperamide (IMODIUM) 2 MG capsule Take 2 mg by mouth every 4 (four) hours as needed for diarrhea or loose stools.    Marland Kitchen LORazepam (ATIVAN) 1 MG tablet Take 2 tablets (2 mg total) by mouth at bedtime. 30 tablet 0  . LORazepam (ATIVAN) 1 MG tablet Take 2 mg by mouth at bedtime.    Marland Kitchen omeprazole (PRILOSEC) 20 MG capsule Take 20 mg by mouth 2 (two) times daily.    Marland Kitchen omeprazole (PRILOSEC) 20 MG capsule Take 20 mg by mouth 2 (two) times daily.    . polyethylene glycol (MIRALAX / GLYCOLAX) packet Take 17 g by mouth daily as needed for mild constipation or moderate constipation.    . polyethylene glycol (MIRALAX / GLYCOLAX) packet Take 17 g by mouth daily as needed (constipation). Mix in 8 oz water or juice and drink    . prochlorperazine (COMPAZINE) 10 MG tablet Take 10 mg by mouth every 6 (six) hours as needed for nausea or vomiting.    . prochlorperazine (COMPAZINE) 10 MG tablet Take 10 mg by mouth every 6 (six) hours as needed for vomiting.    . risperiDONE (RISPERDAL) 1 MG tablet Take 1 tablet (1 mg total) by mouth at bedtime. 30 tablet 0  . risperiDONE (RISPERDAL) 1 MG tablet Take 1 mg by  mouth at bedtime.    . traZODone (DESYREL) 50 MG tablet Take 25 mg by mouth at bedtime as needed for sleep.    . traZODone (DESYREL) 50 MG tablet Take 25 mg by mouth at bedtime as needed for sleep.     No current facility-administered medications for this visit.     SURGICAL HISTORY:  Past Surgical History:  Procedure Laterality Date  . CATARACT EXTRACTION W/PHACO  07/12/2011   Procedure: CATARACT EXTRACTION PHACO AND INTRAOCULAR LENS PLACEMENT (IOC);  Surgeon: Adonis Brook;  Location: [facility];  Service: Ophthalmology;  Laterality: Left;  . EYE SURGERY     L eye  .  VIDEO BRONCHOSCOPY Bilateral 10/01/2013   Procedure: VIDEO BRONCHOSCOPY WITHOUT FLUORO;  Surgeon: Collene Gobble, MD;  Location: [facility];  Service: Cardiopulmonary;  Laterality: Bilateral;    REVIEW OF SYSTEMS:  A comprehensive review of systems was negative except for: Constitutional: positive for fatigue   PHYSICAL EXAMINATION: General appearance: alert, cooperative, fatigued and no distress Head: Normocephalic, without obvious abnormality, atraumatic Neck: no adenopathy, no JVD, supple, symmetrical, trachea midline and thyroid not enlarged, symmetric, no tenderness/mass/nodules Lymph nodes: Cervical, supraclavicular, and axillary nodes normal. Resp: clear to auscultation bilaterally Back: symmetric, no curvature. ROM normal. No CVA tenderness. Cardio: regular rate and rhythm, S1, S2 normal, no murmur, click, rub or gallop GI: soft, non-tender; bowel sounds normal; no masses,  no organomegaly Extremities: extremities normal, atraumatic, no cyanosis or edema  ECOG PERFORMANCE STATUS: 1 - Symptomatic but completely ambulatory  Blood pressure 97/71, pulse 94, resp. rate 18, height 6' (1.829 m), weight 123 lb 11.2 oz (56.1 kg), SpO2 100 %.  LABORATORY DATA: Lab Results  Component Value Date   WBC 5.4 01/18/2017   HGB 12.6 (L) 01/18/2017   HCT 40.1 01/18/2017   MCV 88.7 01/18/2017   PLT 159 01/18/2017       Chemistry      Component Value Date/Time   NA 143 01/18/2017 1122   K 3.5 01/18/2017 1122   CL 105 01/17/2017 0605   CO2 32 (H) 01/18/2017 1122   BUN 7.4 01/18/2017 1122   CREATININE 0.9 01/18/2017 1122      Component Value Date/Time   CALCIUM 8.4 01/18/2017 1122   ALKPHOS 70 01/18/2017 1122   AST 10 01/18/2017 1122   ALT 6 01/18/2017 1122   BILITOT 0.27 01/18/2017 1122       RADIOGRAPHIC STUDIES: Ct Head Wo Contrast  Result Date: 01/14/2017 CLINICAL DATA:  Severe AMS pt, try to put strap around his head to do the scan, three unsuccessful attempt to get the scan. Emergency doctor asked to get the scan. EXAM: CT HEAD WITHOUT CONTRAST TECHNIQUE: Contiguous axial images were obtained from the base of the skull through the vertex without intravenous contrast. COMPARISON:  None. FINDINGS: Brain: Head CT is nondiagnostic due to severe patient motion artifact. Three separate scans were attempted, all nondiagnostic. Vascular: Cannot be assessed. Skull: Markedly limited characterization due to patient motion artifact. No obvious skull fracture or displacement. Sinuses/Orbits: Sinuses grossly clear. Other: None IMPRESSION: Nondiagnostic head CT due to severe patient motion artifact. Electronically Signed   By: Franki Cabot M.D.   On: 01/14/2017 15:16   Ct Head Wo Contrast  Result Date: 12/31/2016 CLINICAL DATA:  Postictal.  History of lung cancer. EXAM: CT HEAD WITHOUT CONTRAST TECHNIQUE: Contiguous axial images were obtained from the base of the skull through the vertex without intravenous contrast. COMPARISON:  04/23/2016 FINDINGS: Brain: No evidence of acute infarction, hemorrhage, hydrocephalus, extra-axial collection or mass lesion/mass effect. Mild brain parenchymal volume loss and periventricular microangiopathy. Vascular: No hyperdense vessel. Skull: Normal. Negative for fracture or focal lesion. Sinuses/Orbits: No acute finding. Other: None. IMPRESSION: No acute intracranial abnormality.  Mild brain parenchymal atrophy and chronic microvascular disease. Electronically Signed   By: Fidela Salisbury M.D.   On: 12/31/2016 21:46   Mr Jeri Cos KG Contrast  Result Date: 01/17/2017 CLINICAL DATA:  Seizure. History of lung cancer and mental retardation. EXAM: MRI HEAD WITHOUT AND WITH CONTRAST TECHNIQUE: Multiplanar, multiecho pulse sequences of the brain and surrounding structures were obtained without and with intravenous contrast. CONTRAST:  29m MULTIHANCE  GADOBENATE DIMEGLUMINE 529 MG/ML IV SOLN COMPARISON:  Head CT 01/14/2017 FINDINGS: Brain: There is no evidence of acute infarct, intracranial hemorrhage, mass, midline shift, or extra-axial fluid collection. There is mild cerebral atrophy. Dedicated temporal lobe imaging demonstrates symmetric mesial temporal lobe atrophy without focal signal abnormality. Minimal hazy periventricular white matter T2 hyperintensity and a few discrete foci of T2 hyperintensity in the cerebral white matter and left pons are nonspecific but may reflect minimal chronic small vessel ischemic disease, not greater than expected for patient's age. No abnormal enhancement is identified. An incidental cavum septum pellucidum et vergae is noted. Vascular: Major intracranial vascular flow voids are preserved. Skull and upper cervical spine: Unremarkable bone marrow signal. Sinuses/Orbits: Bilateral cataract extraction. No significant sinus disease. Other: None. IMPRESSION: 1. No acute intracranial abnormality or metastatic disease identified. 2. Mild cerebral atrophy. Electronically Signed   By: Logan Bores M.D.   On: 01/17/2017 14:15   Dg Chest Portable 1 View  Result Date: 01/14/2017 CLINICAL DATA:  Seizure like activity. EXAM: PORTABLE CHEST 1 VIEW COMPARISON:  None. FINDINGS: Heart size is normal. Grossly stable density overlying the region of the aortic arch, compatible with the suprahilar mass demonstrated on earlier chest CT of 09/07/2016. Heart size is upper  normal, stable. Straightening of the left heart shadow, indicating associated airspace collapse. No acute or suspicious osseous finding. IMPRESSION: 1. 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 Suspect some associated airspace collapse in this region given the configuration of the heart and left-sided volume loss. 2. Right lung remains clear. Electronically Signed   By: Franki Cabot M.D.   On: 01/14/2017 13:36   Dg Chest Port 1 View  Result Date: 01/01/2017 CLINICAL DATA:  Recurrent seizures. History of recurrent lung cancer. EXAM: PORTABLE CHEST 1 VIEW COMPARISON:  CT chest September 07, 2016 and PET- CT September 29, 2016 FINDINGS: Heart size is normal. LEFT perihilar mass as seen on prior CT. No pleural effusion. No pneumothorax. Soft tissue planes included osseous structures are nonsuspicious. IMPRESSION: Re- demonstration of LEFT perihilar mass without acute cardiopulmonary process. Electronically Signed   By: Elon Alas M.D.   On: 01/01/2017 01:18   Dg Swallowing Func-speech Pathology  Result Date: 01/15/2017 Objective Swallowing Evaluation: Type of Study: Bedside Swallow Evaluation Patient Details Name: Marco [name] MRN: [id number] Date of Birth: 12-Mar-1951 Today's Date: 01/15/2017 Time: SLP Start Time (ACUTE ONLY): 1050-SLP Stop Time (ACUTE ONLY): 1110 SLP Time Calculation (min) (ACUTE ONLY): 20 min Past Medical History: No past medical history on file. Past Surgical History: No past surgical history on file. HPI: Daine Gunther Watkinsis a 66 y.o.malemedical history significant of severe intellectual disability, seizure disorder, SCC of L. Lung on chemotherapy followed by Dr. Earlie Server admitted after having a seizure at his group home. CT attempted resulting in nondiagnostic head CT due to severe patient motion artifact.  CXR grossly stable density overlying the region of the aortic arch,  compatible with the left suprahilar mass demonstrated on earlier chest CT of 09/07/2016. Suspect some associated airspace collapse in this region given the configuration of the heart and left-sided volume loss, right lung remains clear. No Data Recorded Assessment / Plan / Recommendation CHL IP CLINICAL IMPRESSIONS 01/15/2017 Clinical Impression Pt exhibits mild oral and pharyngeal dysphagia marked by decreased epiglottic deflection and laryngeal closure. Laryngeal penetration (reflexive cough/throat clear majority of time) with thin and nectar intermittently (including flash, to vocal cords,  subsequent swallows cleared). Pt with frequent eructation at bedside and during MBS with intermittently tight UES and slower cervical esophageal transit. Vallecular and pyriform sinus residue reduced with spontaneous swallows. Pt is very impulsive and is unable to modify sip size despite max verbal cues. There was no significant difference between thin and nectar re; penetration. No pna on CXR and pt admitted for seizure. Recommend Dys 3 texture (mechanical soft), thin liquids, no straws, tactile cues (remove cup from pt when needed), full supervision and crush meds.    SLP Visit Diagnosis Dysphagia, unspecified (R13.10) Attention and concentration deficit following -- Frontal lobe and executive function deficit following -- Impact on safety and function Moderate aspiration risk   CHL IP TREATMENT RECOMMENDATION 01/15/2017 Treatment Recommendations Therapy as outlined in treatment plan below   Prognosis 01/15/2017 Prognosis for Safe Diet Advancement Fair Barriers to Reach Goals Cognitive deficits Barriers/Prognosis Comment -- CHL IP DIET RECOMMENDATION 01/15/2017 SLP Diet Recommendations Dysphagia 3 (Mech soft) solids;Thin liquid Liquid Administration via Cup;No straw Medication Administration Crushed with puree Compensations Minimize environmental distractions;Slow rate;Small sips/bites Postural Changes Remain semi-upright after  after feeds/meals (Comment)   CHL IP OTHER RECOMMENDATIONS 01/15/2017 Recommended Consults -- Oral Care Recommendations Oral care BID Other Recommendations --   CHL IP FOLLOW UP RECOMMENDATIONS 01/15/2017 Follow up Recommendations None   CHL IP FREQUENCY AND DURATION 01/15/2017 Speech Therapy Frequency (ACUTE ONLY) min 2x/week Treatment Duration 2 weeks      CHL IP ORAL PHASE 01/15/2017 Oral Phase Impaired Oral - Pudding Teaspoon -- Oral - Pudding Cup -- Oral - Honey Teaspoon -- Oral - Honey Cup -- Oral - Nectar Teaspoon -- Oral - Nectar Cup WFL Oral - Nectar Straw -- Oral - Thin Teaspoon -- Oral - Thin Cup WFL Oral - Thin Straw -- Oral - Puree -- Oral - Mech Soft -- Oral - Regular Delayed oral transit Oral - Multi-Consistency -- Oral - Pill -- Oral Phase - Comment --  CHL IP PHARYNGEAL PHASE 01/15/2017 Pharyngeal Phase Impaired Pharyngeal- Pudding Teaspoon -- Pharyngeal -- Pharyngeal- Pudding Cup -- Pharyngeal -- Pharyngeal- Honey Teaspoon -- Pharyngeal -- Pharyngeal- Honey Cup -- Pharyngeal -- Pharyngeal- Nectar Teaspoon -- Pharyngeal -- Pharyngeal- Nectar Cup Pharyngeal residue - valleculae;Pharyngeal residue - pyriform;Reduced epiglottic inversion Pharyngeal -- Pharyngeal- Nectar Straw -- Pharyngeal -- Pharyngeal- Thin Teaspoon -- Pharyngeal -- Pharyngeal- Thin Cup Penetration/Aspiration during swallow;Pharyngeal residue - valleculae;Pharyngeal residue - pyriform;Reduced epiglottic inversion Pharyngeal Material enters airway, remains ABOVE vocal cords then ejected out;Material enters airway, CONTACTS cords and then ejected out;Material enters airway, remains ABOVE vocal cords and not ejected out Pharyngeal- Thin Straw Penetration/Aspiration during swallow Pharyngeal Material enters airway, remains ABOVE vocal cords then ejected out;Material enters airway, remains ABOVE vocal cords and not ejected out Pharyngeal- Puree -- Pharyngeal -- Pharyngeal- Mechanical Soft -- Pharyngeal -- Pharyngeal- Regular WFL Pharyngeal --  Pharyngeal- Multi-consistency -- Pharyngeal -- Pharyngeal- Pill -- Pharyngeal -- Pharyngeal Comment --  CHL IP CERVICAL ESOPHAGEAL PHASE 01/15/2017 Cervical Esophageal Phase Impaired Pudding Teaspoon -- Pudding Cup -- Honey Teaspoon -- Honey Cup -- Nectar Teaspoon -- Nectar Cup -- Nectar Straw -- Thin Teaspoon -- Thin Cup -- Thin Straw -- Puree -- Mechanical Soft -- Regular -- Multi-consistency -- Pill -- Cervical Esophageal Comment -- CHL IP GO 01/15/2017 Functional Assessment Tool Used skilled clinical judgement Functional Limitations Swallowing Swallow Current Status (F7510) CK Swallow Goal Status (C5852) CJ Swallow Discharge Status (D7824) CJ Motor Speech Current Status (M3536) (None) Motor Speech Goal Status (R4431) (None) Motor Speech Goal Status (  G9158) (None) Spoken Language Comprehension Current Status [phone]) (None) Spoken Language Comprehension Goal Status [phone]) (None) Spoken Language Comprehension Discharge Status [phone]) (None) Spoken Language Expression Current Status [phone]) (None) Spoken Language Expression Goal Status [phone]) (None) Spoken Language Expression Discharge Status [phone]) (None) Attention Current Status (A7681) (None) Attention Goal Status (L5726) (None) Attention Discharge Status [phone]) (None) Memory Current Status (H7416) (None) Memory Goal Status (L8453) (None) Memory Discharge Status (M4680) (None) Voice Current Status (H2122) (None) Voice Goal Status (Q8250) (None) Voice Discharge Status (I3704) (None) Other Speech-Language Pathology Functional Limitation Current Status (U8891) (None) Other Speech-Language Pathology Functional Limitation Goal Status (Q9450) (None) Other Speech-Language Pathology Functional Limitation Discharge Status [phone]) (None) [name] 01/15/2017, 12:02 PM [name] [name].Ed Engineer, agricultural [phone]              ASSESSMENT AND PLAN:  This is a very pleasant 66 years old African-American male with recurrent non-small cell lung cancer, squamous  cell carcinoma that was initially diagnosed as unresectable a stage IIa, status post concurrent chemoradiation followed by 3 cycles of consolidation chemotherapy. The patient has recurrent disease and he was started on treatment with Nivolumab every 2 weeks is status post 3 cycles. Has been treating his treatment well with no specific complaints except for increasing fatigue and weakness after the seizure activity earlier this week. I recommended for him to delay the start of cycle #4 of his treatment with immunotherapy by 2 weeks. He would come back for follow-up visit at that time. For the seizure activity will continue his current treatment with Depakote as prescribed by neurology. The patient was advised to call immediately if he has any concerning symptoms in the interval. The patient voices understanding of current disease status and treatment options and is in agreement with the current care plan. All questions were answered. The patient knows to call the clinic with any problems, questions or concerns. We can certainly see the patient much sooner if necessary. I spent 10 minutes counseling the patient face to face. The total time spent in the appointment was 15 minutes.  Disclaimer: This note was dictated with voice recognition software. Similar sounding words can inadvertently be transcribed and may not be corrected upon review.

## 2017-01-19 ENCOUNTER — Telehealth: Payer: Self-pay | Admitting: Internal Medicine

## 2017-01-19 LAB — CULTURE, BLOOD (ROUTINE X 2)
Culture: NO GROWTH
Culture: NO GROWTH
Special Requests: ADEQUATE
Special Requests: ADEQUATE

## 2017-01-19 NOTE — Telephone Encounter (Signed)
Scheduled additional appts per 5/17 los. Patient to get new schedule next treatment day

## 2017-01-24 ENCOUNTER — Ambulatory Visit: Payer: Medicare Other | Admitting: Neurology

## 2017-02-01 ENCOUNTER — Ambulatory Visit (HOSPITAL_BASED_OUTPATIENT_CLINIC_OR_DEPARTMENT_OTHER): Payer: Medicare Other | Admitting: Internal Medicine

## 2017-02-01 ENCOUNTER — Other Ambulatory Visit (HOSPITAL_BASED_OUTPATIENT_CLINIC_OR_DEPARTMENT_OTHER): Payer: Medicare Other

## 2017-02-01 ENCOUNTER — Encounter: Payer: Self-pay | Admitting: Internal Medicine

## 2017-02-01 ENCOUNTER — Ambulatory Visit (HOSPITAL_BASED_OUTPATIENT_CLINIC_OR_DEPARTMENT_OTHER): Payer: Medicare Other

## 2017-02-01 VITALS — BP 100/76 | HR 100 | Resp 18 | Ht 72.0 in | Wt 115.7 lb

## 2017-02-01 DIAGNOSIS — C3412 Malignant neoplasm of upper lobe, left bronchus or lung: Secondary | ICD-10-CM

## 2017-02-01 DIAGNOSIS — E46 Unspecified protein-calorie malnutrition: Secondary | ICD-10-CM

## 2017-02-01 DIAGNOSIS — Z79899 Other long term (current) drug therapy: Secondary | ICD-10-CM | POA: Diagnosis not present

## 2017-02-01 DIAGNOSIS — C3492 Malignant neoplasm of unspecified part of left bronchus or lung: Secondary | ICD-10-CM

## 2017-02-01 DIAGNOSIS — Z5112 Encounter for antineoplastic immunotherapy: Secondary | ICD-10-CM

## 2017-02-01 DIAGNOSIS — R5382 Chronic fatigue, unspecified: Secondary | ICD-10-CM

## 2017-02-01 LAB — CBC WITH DIFFERENTIAL/PLATELET
BASO%: 0.6 % (ref 0.0–2.0)
BASOS ABS: 0 10*3/uL (ref 0.0–0.1)
EOS ABS: 0 10*3/uL (ref 0.0–0.5)
EOS%: 0.3 % (ref 0.0–7.0)
HEMATOCRIT: 41.7 % (ref 38.4–49.9)
HEMOGLOBIN: 13 g/dL (ref 13.0–17.1)
LYMPH#: 1.1 10*3/uL (ref 0.9–3.3)
LYMPH%: 15.8 % (ref 14.0–49.0)
MCH: 28 pg (ref 27.2–33.4)
MCHC: 31.2 g/dL — ABNORMAL LOW (ref 32.0–36.0)
MCV: 89.9 fL (ref 79.3–98.0)
MONO#: 0.6 10*3/uL (ref 0.1–0.9)
MONO%: 8.7 % (ref 0.0–14.0)
NEUT%: 74.6 % (ref 39.0–75.0)
NEUTROS ABS: 5.3 10*3/uL (ref 1.5–6.5)
PLATELETS: 149 10*3/uL (ref 140–400)
RBC: 4.64 10*6/uL (ref 4.20–5.82)
RDW: 14.1 % (ref 11.0–14.6)
WBC: 7.1 10*3/uL (ref 4.0–10.3)

## 2017-02-01 LAB — COMPREHENSIVE METABOLIC PANEL
ALBUMIN: 3.5 g/dL (ref 3.5–5.0)
ALK PHOS: 74 U/L (ref 40–150)
ALT: <6 U/L (ref 0–55)
AST: 7 U/L (ref 5–34)
Anion Gap: 11 mEq/L (ref 3–11)
BILIRUBIN TOTAL: 0.33 mg/dL (ref 0.20–1.20)
BUN: 16.9 mg/dL (ref 7.0–26.0)
CALCIUM: 9.5 mg/dL (ref 8.4–10.4)
CO2: 29 mEq/L (ref 22–29)
Chloride: 104 mEq/L (ref 98–109)
Creatinine: 1 mg/dL (ref 0.7–1.3)
GLUCOSE: 75 mg/dL (ref 70–140)
POTASSIUM: 4 meq/L (ref 3.5–5.1)
Sodium: 144 mEq/L (ref 136–145)
TOTAL PROTEIN: 7.7 g/dL (ref 6.4–8.3)

## 2017-02-01 LAB — TSH: TSH: 1.338 m(IU)/L (ref 0.320–4.118)

## 2017-02-01 MED ORDER — SODIUM CHLORIDE 0.9 % IV SOLN
Freq: Once | INTRAVENOUS | Status: AC
  Administered 2017-02-01: 14:00:00 via INTRAVENOUS

## 2017-02-01 MED ORDER — NIVOLUMAB CHEMO INJECTION 100 MG/10ML
240.0000 mg | Freq: Once | INTRAVENOUS | Status: AC
  Administered 2017-02-01: 240 mg via INTRAVENOUS
  Filled 2017-02-01: qty 24

## 2017-02-01 NOTE — Progress Notes (Signed)
[place] [phone]) [phone]   [phone]  OFFICE PROGRESS NOTE  Leamon Arnt, MD [address] [place] [address]  DIAGNOSIS: Stage IIA (T2a., N1, M0) non-small cell lung cancer, squamous cell carcinoma presented with central left upper lobe mass as well as hilar lymphadenopathy diagnosed in January of 2015.  PRIOR THERAPY:  1. Concurrent chemoradiation with weekly carboplatin for AUC of 2 and paclitaxel 45 mg/M2, status post 7 cycles. Last cycle was given 12/22/2013  2. Systemic chemotherapy with carboplatin for AUC of 5 and paclitaxel 175 MG/M2 with Neulasta support every 3 weeks. Status post 3 cycles. 3.  Immunotherapy with Nivolumab 3 mg/kg given every 2 weeks. Status post 9 cycles. Discontinued secondary to noncompliance but the patient has a stable disease.  CURRENT THERAPY: Treatment again with immunotherapy with Nivolumab 240 MG IV every 2 weeks. First dose 12/07/2016. Status post 3 cycles.  INTERVAL HISTORY: Marco [name] 66 y.o. male returns to the clinic today for follow-up visit accompanied by his caregiver. The patient continues to do fine today with no specific complaints. He lost several pounds in the last 2 weeks. He eats good. He denied having any chest pain, shortness of breath, cough or hemoptysis. He denied having any fever or chills. He has no nausea or vomiting. He continues to tolerate his treatment with Nivolumab fairly well. He is here for evaluation before starting cycle #4.  MEDICAL HISTORY: Past Medical History:  Diagnosis Date  . DEMENTIA   . Depression   . Developmental disability    developmentaly delayed  . Encounter for antineoplastic chemotherapy 10/10/2016  . Encounter for antineoplastic immunotherapy 04/05/2015  . Encounter for antineoplastic immunotherapy 12/21/2016  . GERD (gastroesophageal reflux disease)   . Hx of radiation therapy 11/07/13-12/24/13   lung,60Gy/33fx  . Hypertension    no  medications, no documented history per caregiver at preadmission  . Lung cancer ([facility])   . Mental disorder    sczizophrenia;moderate retardation  . Mental retardation   . Seizure disorder ([facility])   . Seizures ([facility])     ALLERGIES:  has No Known Allergies.  MEDICATIONS:  Current Outpatient Prescriptions  Medication Sig Dispense Refill  . acetaminophen (TYLENOL) 500 MG tablet Take 500 mg by mouth every 4 (four) hours as needed for mild pain or headache.     Marland Kitchen acetaminophen (TYLENOL) 500 MG tablet Take 500 mg by mouth every 4 (four) hours as needed (headache/pain).    . bisacodyl (DULCOLAX) 5 MG EC tablet Take 5 mg by mouth daily as needed for mild constipation.     . bisacodyl (DULCOLAX) 5 MG EC tablet Take 5 mg by mouth daily as needed (constipation).    . diazepam (DIASTAT ACUDIAL) 10 MG GEL Place 5 mg rectally once as needed for seizure.    . diazepam (DIASTAT ACUDIAL) 10 MG GEL Place 10 mg rectally once as needed for seizure (may repeat in 4-6 hours once).    . divalproex (DEPAKOTE ER) 250 MG 24 hr tablet Take 3 tablets (750 mg total) by mouth 2 (two) times daily. 180 tablet 0  . divalproex (DEPAKOTE ER) 250 MG 24 hr tablet Take 750 mg by mouth 2 (two) times daily.    Marland Kitchen donepezil (ARICEPT) 10 MG tablet Take 10 mg by mouth at bedtime.     . donepezil (ARICEPT) 10 MG tablet Take 10 mg by mouth at bedtime.    . fluticasone (FLONASE) 50 MCG/ACT nasal spray  Place 2 sprays into both nostrils daily.    . fluticasone (FLONASE) 50 MCG/ACT nasal spray Place 1 spray into both nostrils daily.    Marland Kitchen HYDROcodone-homatropine (HYCODAN) 5-1.5 MG/5ML syrup Take 5 mLs by mouth every 6 (six) hours as needed for cough. 120 mL 0  . HYDROcodone-homatropine (HYDROMET) 5-1.5 MG/5ML syrup Take 5 mLs by mouth every 6 (six) hours as needed for cough.    Marland Kitchen ketoconazole (NIZORAL) 2 % cream Apply 1 application topically 2 (two) times daily as needed for irritation. Pt applies to buttocks.     Marland Kitchen ketoconazole (NIZORAL) 2 %  cream Apply 1 application topically See admin instructions. Apply to feet twice a day as needed for rash/itching    . lacosamide (VIMPAT) 200 MG TABS tablet Take 1 tablet (200 mg total) by mouth 2 (two) times daily. 60 tablet 1  . lacosamide (VIMPAT) 200 MG TABS tablet Take 200 mg by mouth 2 (two) times daily.    Marland Kitchen levETIRAcetam (KEPPRA XR) 500 MG 24 hr tablet Take 3 tablets (1,500 mg total) by mouth 2 (two) times daily. 180 tablet 11  . levETIRAcetam (KEPPRA XR) 500 MG 24 hr tablet Take 1,500 mg by mouth 2 (two) times daily.    Marland Kitchen loperamide (IMODIUM A-D) 2 MG tablet Take 2 mg by mouth every 4 (four) hours as needed for diarrhea or loose stools.    Marland Kitchen loperamide (IMODIUM) 2 MG capsule Take 2 mg by mouth every 4 (four) hours as needed for diarrhea or loose stools.    Marland Kitchen LORazepam (ATIVAN) 1 MG tablet Take 2 tablets (2 mg total) by mouth at bedtime. 30 tablet 0  . LORazepam (ATIVAN) 1 MG tablet Take 2 mg by mouth at bedtime.    Marland Kitchen omeprazole (PRILOSEC) 20 MG capsule Take 20 mg by mouth 2 (two) times daily.    Marland Kitchen omeprazole (PRILOSEC) 20 MG capsule Take 20 mg by mouth 2 (two) times daily.    . polyethylene glycol (MIRALAX / GLYCOLAX) packet Take 17 g by mouth daily as needed for mild constipation or moderate constipation.    . polyethylene glycol (MIRALAX / GLYCOLAX) packet Take 17 g by mouth daily as needed (constipation). Mix in 8 oz water or juice and drink    . prochlorperazine (COMPAZINE) 10 MG tablet Take 10 mg by mouth every 6 (six) hours as needed for nausea or vomiting.    . prochlorperazine (COMPAZINE) 10 MG tablet Take 10 mg by mouth every 6 (six) hours as needed for vomiting.    . risperiDONE (RISPERDAL) 1 MG tablet Take 1 tablet (1 mg total) by mouth at bedtime. 30 tablet 0  . risperiDONE (RISPERDAL) 1 MG tablet Take 1 mg by mouth at bedtime.    . traZODone (DESYREL) 50 MG tablet Take 25 mg by mouth at bedtime as needed for sleep.    . traZODone (DESYREL) 50 MG tablet Take 25 mg by mouth at  bedtime as needed for sleep.     No current facility-administered medications for this visit.     SURGICAL HISTORY:  Past Surgical History:  Procedure Laterality Date  . CATARACT EXTRACTION W/PHACO  07/12/2011   Procedure: CATARACT EXTRACTION PHACO AND INTRAOCULAR LENS PLACEMENT (IOC);  Surgeon: Adonis Brook;  Location: [facility];  Service: Ophthalmology;  Laterality: Left;  . EYE SURGERY     L eye  . RADIOLOGY WITH ANESTHESIA N/A 01/17/2017   Procedure: M R BRAIN WITH AND WITHOUT CONTRAST;  Surgeon: Radiologist, Medication, MD;  Location: [facility];  Service: Radiology;  Laterality: N/A;  . VIDEO BRONCHOSCOPY Bilateral 10/01/2013   Procedure: VIDEO BRONCHOSCOPY WITHOUT FLUORO;  Surgeon: Collene Gobble, MD;  Location: [facility];  Service: Cardiopulmonary;  Laterality: Bilateral;    REVIEW OF SYSTEMS:  A comprehensive review of systems was negative except for: Constitutional: positive for fatigue and weight loss   PHYSICAL EXAMINATION: General appearance: alert, cooperative, fatigued and no distress Head: Normocephalic, without obvious abnormality, atraumatic Neck: no adenopathy, no JVD, supple, symmetrical, trachea midline and thyroid not enlarged, symmetric, no tenderness/mass/nodules Lymph nodes: Cervical, supraclavicular, and axillary nodes normal. Resp: clear to auscultation bilaterally Back: symmetric, no curvature. ROM normal. No CVA tenderness. Cardio: regular rate and rhythm, S1, S2 normal, no murmur, click, rub or gallop GI: soft, non-tender; bowel sounds normal; no masses,  no organomegaly Extremities: extremities normal, atraumatic, no cyanosis or edema  ECOG PERFORMANCE STATUS: 1 - Symptomatic but completely ambulatory  Blood pressure 100/76, pulse 100, resp. rate 18, height 6' (1.829 m), weight 115 lb 11.2 oz (52.5 kg), SpO2 100 %.  LABORATORY DATA: Lab Results  Component Value Date   WBC 7.1 02/01/2017   HGB 13.0 02/01/2017   HCT 41.7 02/01/2017   MCV 89.9 02/01/2017    PLT 149 02/01/2017      Chemistry      Component Value Date/Time   NA 143 01/18/2017 1122   K 3.5 01/18/2017 1122   CL 105 01/17/2017 0605   CO2 32 (H) 01/18/2017 1122   BUN 7.4 01/18/2017 1122   CREATININE 0.9 01/18/2017 1122      Component Value Date/Time   CALCIUM 8.4 01/18/2017 1122   ALKPHOS 70 01/18/2017 1122   AST 10 01/18/2017 1122   ALT 6 01/18/2017 1122   BILITOT 0.27 01/18/2017 1122       RADIOGRAPHIC STUDIES: Ct Head Wo Contrast  Result Date: 01/14/2017 CLINICAL DATA:  Severe AMS pt, try to put strap around his head to do the scan, three unsuccessful attempt to get the scan. Emergency doctor asked to get the scan. EXAM: CT HEAD WITHOUT CONTRAST TECHNIQUE: Contiguous axial images were obtained from the base of the skull through the vertex without intravenous contrast. COMPARISON:  None. FINDINGS: Brain: Head CT is nondiagnostic due to severe patient motion artifact. Three separate scans were attempted, all nondiagnostic. Vascular: Cannot be assessed. Skull: Markedly limited characterization due to patient motion artifact. No obvious skull fracture or displacement. Sinuses/Orbits: Sinuses grossly clear. Other: None IMPRESSION: Nondiagnostic head CT due to severe patient motion artifact. Electronically Signed   By: Franki Cabot M.D.   On: 01/14/2017 15:16   Mr Jeri Cos BJ Contrast  Result Date: 01/17/2017 CLINICAL DATA:  Seizure. History of lung cancer and mental retardation. EXAM: MRI HEAD WITHOUT AND WITH CONTRAST TECHNIQUE: Multiplanar, multiecho pulse sequences of the brain and surrounding structures were obtained without and with intravenous contrast. CONTRAST:  65mL MULTIHANCE GADOBENATE DIMEGLUMINE 529 MG/ML IV SOLN COMPARISON:  Head CT 01/14/2017 FINDINGS: Brain: There is no evidence of acute infarct, intracranial hemorrhage, mass, midline shift, or extra-axial fluid collection. There is mild cerebral atrophy. Dedicated temporal lobe imaging demonstrates symmetric  mesial temporal lobe atrophy without focal signal abnormality. Minimal hazy periventricular white matter T2 hyperintensity and a few discrete foci of T2 hyperintensity in the cerebral white matter and left pons are nonspecific but may reflect minimal chronic small vessel ischemic disease, not greater than expected for patient's age. No abnormal enhancement is identified. An incidental cavum septum pellucidum et vergae is  noted. Vascular: Major intracranial vascular flow voids are preserved. Skull and upper cervical spine: Unremarkable bone marrow signal. Sinuses/Orbits: Bilateral cataract extraction. No significant sinus disease. Other: None. IMPRESSION: 1. No acute intracranial abnormality or metastatic disease identified. 2. Mild cerebral atrophy. Electronically Signed   By: Logan Bores M.D.   On: 01/17/2017 14:15   Dg Chest Portable 1 View  Result Date: 01/14/2017 CLINICAL DATA:  Seizure like activity. EXAM: PORTABLE CHEST 1 VIEW COMPARISON:  None. FINDINGS: Heart size is normal. Grossly stable density overlying the region of the aortic arch, compatible with the suprahilar mass demonstrated on earlier chest CT of 09/07/2016. Heart size is upper normal, stable. Straightening of the left heart shadow, indicating associated airspace collapse. No acute or suspicious osseous finding. IMPRESSION: 1. 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 Suspect some associated airspace collapse in this region given the configuration of the heart and left-sided volume loss. 2. Right lung remains clear. Electronically Signed   By: Franki Cabot M.D.   On: 01/14/2017 13:36   Dg Swallowing Func-speech Pathology  Result Date: 01/15/2017 Objective Swallowing Evaluation: Type of Study: Bedside Swallow Evaluation Patient Details Name: GREYSEN DEVINO MRN: [id number] Date of Birth: [birth year] Today's Date: 01/15/2017 Time:  SLP Start Time (ACUTE ONLY): 1050-SLP Stop Time (ACUTE ONLY): 1110 SLP Time Calculation (min) (ACUTE ONLY): 20 min Past Medical History: No past medical history on file. Past Surgical History: No past surgical history on file. HPI: Dracen Reigle Watkinsis a 66 y.o.malemedical history significant of severe intellectual disability, seizure disorder, SCC of L. Lung on chemotherapy followed by Dr. Earlie Server admitted after having a seizure at his group home. CT attempted resulting in nondiagnostic head CT due to severe patient motion artifact.  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 Suspect some associated airspace collapse in this region given the configuration of the heart and left-sided volume loss, right lung remains clear. No Data Recorded Assessment / Plan / Recommendation CHL IP CLINICAL IMPRESSIONS 01/15/2017 Clinical Impression Pt exhibits mild oral and pharyngeal dysphagia marked by decreased epiglottic deflection and laryngeal closure. Laryngeal penetration (reflexive cough/throat clear majority of time) with thin and nectar intermittently (including flash, to vocal cords, subsequent swallows cleared). Pt with frequent eructation at bedside and during MBS with intermittently tight UES and slower cervical esophageal transit. Vallecular and pyriform sinus residue reduced with spontaneous swallows. Pt is very impulsive and is unable to modify sip size despite max verbal cues. There was no significant difference between thin and nectar re; penetration. No pna on CXR and pt admitted for seizure. Recommend Dys 3 texture (mechanical soft), thin liquids, no straws, tactile cues (remove cup from pt when needed), full supervision and crush meds.    SLP Visit Diagnosis Dysphagia, unspecified (R13.10) Attention and concentration deficit following -- Frontal lobe and executive function deficit following --  Impact on safety and function Moderate aspiration risk   CHL IP TREATMENT RECOMMENDATION 01/15/2017 Treatment Recommendations Therapy as outlined in treatment plan below   Prognosis 01/15/2017 Prognosis for Safe Diet Advancement Fair Barriers to Reach Goals Cognitive deficits Barriers/Prognosis Comment -- CHL IP DIET RECOMMENDATION 01/15/2017 SLP Diet Recommendations Dysphagia 3 (Mech soft) solids;Thin liquid Liquid Administration via Cup;No straw Medication Administration Crushed with puree Compensations Minimize environmental distractions;Slow rate;Small sips/bites Postural Changes Remain semi-upright after after feeds/meals (Comment)  CHL IP OTHER RECOMMENDATIONS 01/15/2017 Recommended Consults -- Oral Care Recommendations Oral care BID Other Recommendations --   CHL IP FOLLOW UP RECOMMENDATIONS 01/15/2017 Follow up Recommendations None   CHL IP FREQUENCY AND DURATION 01/15/2017 Speech Therapy Frequency (ACUTE ONLY) min 2x/week Treatment Duration 2 weeks      CHL IP ORAL PHASE 01/15/2017 Oral Phase Impaired Oral - Pudding Teaspoon -- Oral - Pudding Cup -- Oral - Honey Teaspoon -- Oral - Honey Cup -- Oral - Nectar Teaspoon -- Oral - Nectar Cup WFL Oral - Nectar Straw -- Oral - Thin Teaspoon -- Oral - Thin Cup WFL Oral - Thin Straw -- Oral - Puree -- Oral - Mech Soft -- Oral - Regular Delayed oral transit Oral - Multi-Consistency -- Oral - Pill -- Oral Phase - Comment --  CHL IP PHARYNGEAL PHASE 01/15/2017 Pharyngeal Phase Impaired Pharyngeal- Pudding Teaspoon -- Pharyngeal -- Pharyngeal- Pudding Cup -- Pharyngeal -- Pharyngeal- Honey Teaspoon -- Pharyngeal -- Pharyngeal- Honey Cup -- Pharyngeal -- Pharyngeal- Nectar Teaspoon -- Pharyngeal -- Pharyngeal- Nectar Cup Pharyngeal residue - valleculae;Pharyngeal residue - pyriform;Reduced epiglottic inversion Pharyngeal -- Pharyngeal- Nectar Straw -- Pharyngeal -- Pharyngeal- Thin Teaspoon -- Pharyngeal -- Pharyngeal- Thin Cup Penetration/Aspiration during swallow;Pharyngeal  residue - valleculae;Pharyngeal residue - pyriform;Reduced epiglottic inversion Pharyngeal Material enters airway, remains ABOVE vocal cords then ejected out;Material enters airway, CONTACTS cords and then ejected out;Material enters airway, remains ABOVE vocal cords and not ejected out Pharyngeal- Thin Straw Penetration/Aspiration during swallow Pharyngeal Material enters airway, remains ABOVE vocal cords then ejected out;Material enters airway, remains ABOVE vocal cords and not ejected out Pharyngeal- Puree -- Pharyngeal -- Pharyngeal- Mechanical Soft -- Pharyngeal -- Pharyngeal- Regular WFL Pharyngeal -- Pharyngeal- Multi-consistency -- Pharyngeal -- Pharyngeal- Pill -- Pharyngeal -- Pharyngeal Comment --  CHL IP CERVICAL ESOPHAGEAL PHASE 01/15/2017 Cervical Esophageal Phase Impaired Pudding Teaspoon -- Pudding Cup -- Honey Teaspoon -- Honey Cup -- Nectar Teaspoon -- Nectar Cup -- Nectar Straw -- Thin Teaspoon -- Thin Cup -- Thin Straw -- Puree -- Mechanical Soft -- Regular -- Multi-consistency -- Pill -- Cervical Esophageal Comment -- CHL IP GO 01/15/2017 Functional Assessment Tool Used skilled clinical judgement Functional Limitations Swallowing Swallow Current Status (C1638) CK Swallow Goal Status (G5364) CJ Swallow Discharge Status (W8032) CJ Motor Speech Current Status (Z2248) (None) Motor Speech Goal Status (G5003) (None) Motor Speech Goal Status (B0488) (None) Spoken Language Comprehension Current Status (Q9169) (None) Spoken Language Comprehension Goal Status (I5038) (None) Spoken Language Comprehension Discharge Status (U8280) (None) Spoken Language Expression Current Status (K3491) (None) Spoken Language Expression Goal Status (P9150) (None) Spoken Language Expression Discharge Status (V6979) (None) Attention Current Status (Y8016) (None) Attention Goal Status (P5374) (None) Attention Discharge Status (M2707) (None) Memory Current Status (E6754) (None) Memory Goal Status (G9201) (None) Memory Discharge  Status (E0712) (None) Voice Current Status (R9758) (None) Voice Goal Status (I3254) (None) Voice Discharge Status (D8264) (None) Other Speech-Language Pathology Functional Limitation Current Status (B5830) (None) Other Speech-Language Pathology Functional Limitation Goal Status (N4076) (None) Other Speech-Language Pathology Functional Limitation Discharge Status [phone]) (None) [name] 01/15/2017, 12:02 PM [name] [name].Ed Engineer, agricultural [phone]              ASSESSMENT AND PLAN:  This is a very pleasant 66 years old African-American male with recurrent non-small cell lung cancer, squamous cell carcinoma and he is currently undergoing treatment with immunotherapy with Nivolumab status post 3 cycles. He continues to tolerated treatment well with no significant adverse effects. I recommended for the  patient to proceed with cycle #4 today as a scheduled. I will see him back for follow-up visit in 2 weeks for evaluation and repeat CT scan of the chest for restaging of his disease. For the malnutrition, I strongly advised the patient to increase his oral intake and also uses supplemental nutrition. He was advised to call immediately if he has any concerning symptoms in the interval. The patient voices understanding of current disease status and treatment options and is in agreement with the current care plan. All questions were answered. The patient knows to call the clinic with any problems, questions or concerns. We can certainly see the patient much sooner if necessary. I spent 10 minutes counseling the patient face to face. The total time spent in the appointment was 15 minutes.  Disclaimer: This note was dictated with voice recognition software. Similar sounding words can inadvertently be transcribed and may not be corrected upon review.

## 2017-02-01 NOTE — Patient Instructions (Addendum)
[place] Discharge Instructions for Patients Receiving Chemotherapy  Today you received the following chemotherapy agents Nivolumab  To help prevent nausea and vomiting after your treatment, we encourage you to take your nausea medication as directed If you develop nausea and vomiting that is not controlled by your nausea medication, call the clinic.   BELOW ARE SYMPTOMS THAT SHOULD BE REPORTED IMMEDIATELY:  *FEVER GREATER THAN 100.5 F  *CHILLS WITH OR WITHOUT FEVER  NAUSEA AND VOMITING THAT IS NOT CONTROLLED WITH YOUR NAUSEA MEDICATION  *UNUSUAL SHORTNESS OF BREATH  *UNUSUAL BRUISING OR BLEEDING  TENDERNESS IN MOUTH AND THROAT WITH OR WITHOUT PRESENCE OF ULCERS  *URINARY PROBLEMS  *BOWEL PROBLEMS  UNUSUAL RASH Items with * indicate a potential emergency and should be followed up as soon as possible.  Feel free to call the clinic you have any questions or concerns. The clinic phone number is [phone].  Please show the [place] at check-in to the Emergency Department and triage nurse.  Nivolumab injection What is this medicine? NIVOLUMAB (nye VOL ue mab) is a monoclonal antibody. It is used to treat melanoma, lung cancer, kidney cancer, head and neck cancer, Hodgkin lymphoma, urothelial cancer, colon cancer, and liver cancer. This medicine may be used for other purposes; ask your health care provider or pharmacist if you have questions. COMMON BRAND NAME(S): Opdivo What should I tell my health care provider before I take this medicine? They need to know if you have any of these conditions: -diabetes -immune system problems -kidney disease -liver disease -lung disease -organ transplant -stomach or intestine problems -thyroid disease -an unusual or allergic reaction to nivolumab, other medicines, foods, dyes, or preservatives -pregnant or trying to get pregnant -breast-feeding How should I use this medicine? This medicine is for infusion  into a vein. It is given by a health care professional in a hospital or clinic setting. A special MedGuide will be given to you before each treatment. Be sure to read this information carefully each time. Talk to your pediatrician regarding the use of this medicine in children. While this drug may be prescribed for children as young as 12 years for selected conditions, precautions do apply. Overdosage: If you think you have taken too much of this medicine contact a poison control center or emergency room at once. NOTE: This medicine is only for you. Do not share this medicine with others. What if I miss a dose? It is important not to miss your dose. Call your doctor or health care professional if you are unable to keep an appointment. What may interact with this medicine? Interactions have not been studied. Give your health care provider a list of all the medicines, herbs, non-prescription drugs, or dietary supplements you use. Also tell them if you smoke, drink alcohol, or use illegal drugs. Some items may interact with your medicine. This list may not describe all possible interactions. Give your health care provider a list of all the medicines, herbs, non-prescription drugs, or dietary supplements you use. Also tell them if you smoke, drink alcohol, or use illegal drugs. Some items may interact with your medicine. What should I watch for while using this medicine? This drug may make you feel generally unwell. Continue your course of treatment even though you feel ill unless your doctor tells you to stop. You may need blood work done while you are taking this medicine. Do not become pregnant while taking this medicine or for 5 months after stopping it. Women should  inform their doctor if they wish to become pregnant or think they might be pregnant. There is a potential for serious side effects to an unborn child. Talk to your health care professional or pharmacist for more information. Do not  breast-feed an infant while taking this medicine. What side effects may I notice from receiving this medicine? Side effects that you should report to your doctor or health care professional as soon as possible: -allergic reactions like skin rash, itching or hives, swelling of the face, lips, or tongue -black, tarry stools -blood in the urine -bloody or watery diarrhea -changes in vision -change in sex drive -changes in emotions or moods -chest pain -confusion -cough -decreased appetite -diarrhea -facial flushing -feeling faint or lightheaded -fever, chills -hair loss -hallucination, loss of contact with reality -headache -irritable -joint pain -loss of memory -muscle pain -muscle weakness -seizures -shortness of breath -signs and symptoms of high blood sugar such as dizziness; dry mouth; dry skin; fruity breath; nausea; stomach pain; increased hunger or thirst; increased urination -signs and symptoms of kidney injury like trouble passing urine or change in the amount of urine -signs and symptoms of liver injury like dark yellow or brown urine; general ill feeling or flu-like symptoms; light-colored stools; loss of appetite; nausea; right upper belly pain; unusually weak or tired; yellowing of the eyes or skin -stiff neck -swelling of the ankles, feet, hands -weight gain Side effects that usually do not require medical attention (report to your doctor or health care professional if they continue or are bothersome): -bone pain -constipation -tiredness -vomiting This list may not describe all possible side effects. Call your doctor for medical advice about side effects. You may report side effects to FDA at 1-800-FDA-1088. Where should I keep my medicine? This drug is given in a hospital or clinic and will not be stored at home. NOTE: This sheet is a summary. It may not cover all possible information. If you have questions about this medicine, talk to your doctor, pharmacist,  or health care provider.  2018 Elsevier/Gold Standard (2016-05-29 17:49:34)

## 2017-02-02 ENCOUNTER — Telehealth: Payer: Self-pay | Admitting: Internal Medicine

## 2017-02-02 ENCOUNTER — Other Ambulatory Visit: Payer: Self-pay | Admitting: Neurology

## 2017-02-02 NOTE — Telephone Encounter (Signed)
Appts already scheduled per 5/31 LOS - 3 cycles , no additional appts added.

## 2017-02-05 ENCOUNTER — Other Ambulatory Visit: Payer: Self-pay

## 2017-02-05 ENCOUNTER — Other Ambulatory Visit: Payer: Self-pay | Admitting: Neurology

## 2017-02-05 MED ORDER — DIVALPROEX SODIUM ER 250 MG PO TB24: 750.0000 mg | ORAL_TABLET | Freq: Two times a day (BID) | ORAL | 0 refills | Status: DC

## 2017-02-11 ENCOUNTER — Emergency Department (HOSPITAL_COMMUNITY): Payer: Medicare Other

## 2017-02-11 ENCOUNTER — Emergency Department (HOSPITAL_COMMUNITY)
Admission: EM | Admit: 2017-02-11 | Discharge: 2017-02-11 | Disposition: A | Discharge status: Home / Self Care | Payer: Medicare Other | Attending: Emergency Medicine | Admitting: Emergency Medicine

## 2017-02-11 ENCOUNTER — Encounter (HOSPITAL_COMMUNITY): Payer: Self-pay

## 2017-02-11 DIAGNOSIS — I10 Essential (primary) hypertension: Secondary | ICD-10-CM | POA: Diagnosis not present

## 2017-02-11 DIAGNOSIS — R410 Disorientation, unspecified: Secondary | ICD-10-CM

## 2017-02-11 DIAGNOSIS — C3412 Malignant neoplasm of upper lobe, left bronchus or lung: Secondary | ICD-10-CM | POA: Diagnosis not present

## 2017-02-11 DIAGNOSIS — R531 Weakness: Secondary | ICD-10-CM | POA: Diagnosis present

## 2017-02-11 DIAGNOSIS — Z87891 Personal history of nicotine dependence: Secondary | ICD-10-CM | POA: Diagnosis not present

## 2017-02-11 DIAGNOSIS — Z79899 Other long term (current) drug therapy: Secondary | ICD-10-CM | POA: Diagnosis not present

## 2017-02-11 DIAGNOSIS — F819 Developmental disorder of scholastic skills, unspecified: Secondary | ICD-10-CM | POA: Diagnosis not present

## 2017-02-11 LAB — URINALYSIS, ROUTINE W REFLEX MICROSCOPIC
Bilirubin Urine: NEGATIVE
GLUCOSE, UA: NEGATIVE mg/dL
HGB URINE DIPSTICK: NEGATIVE
Ketones, ur: 20 mg/dL — AB
Leukocytes, UA: NEGATIVE
Nitrite: NEGATIVE
Protein, ur: NEGATIVE mg/dL
SPECIFIC GRAVITY, URINE: 1.024 (ref 1.005–1.030)
pH: 5 (ref 5.0–8.0)

## 2017-02-11 LAB — CBC WITH DIFFERENTIAL/PLATELET
Basophils Absolute: 0 10*3/uL (ref 0.0–0.1)
Basophils Relative: 0 %
Eosinophils Absolute: 0 10*3/uL (ref 0.0–0.7)
Eosinophils Relative: 0 %
HEMATOCRIT: 36.4 % — AB (ref 39.0–52.0)
HEMOGLOBIN: 11.3 g/dL — AB (ref 13.0–17.0)
LYMPHS ABS: 1 10*3/uL (ref 0.7–4.0)
LYMPHS PCT: 14 %
MCH: 27.5 pg (ref 26.0–34.0)
MCHC: 31 g/dL (ref 30.0–36.0)
MCV: 88.6 fL (ref 78.0–100.0)
Monocytes Absolute: 1 10*3/uL (ref 0.1–1.0)
Monocytes Relative: 14 %
NEUTROS ABS: 5.1 10*3/uL (ref 1.7–7.7)
NEUTROS PCT: 72 %
Platelets: 179 10*3/uL (ref 150–400)
RBC: 4.11 MIL/uL — AB (ref 4.22–5.81)
RDW: 14 % (ref 11.5–15.5)
WBC: 7.1 10*3/uL (ref 4.0–10.5)

## 2017-02-11 LAB — COMPREHENSIVE METABOLIC PANEL
ALT: 12 U/L — ABNORMAL LOW (ref 17–63)
ANION GAP: 8 (ref 5–15)
AST: 13 U/L — ABNORMAL LOW (ref 15–41)
Albumin: 2.8 g/dL — ABNORMAL LOW (ref 3.5–5.0)
Alkaline Phosphatase: 49 U/L (ref 38–126)
BUN: 8 mg/dL (ref 6–20)
CHLORIDE: 104 mmol/L (ref 101–111)
CO2: 29 mmol/L (ref 22–32)
Calcium: 8.5 mg/dL — ABNORMAL LOW (ref 8.9–10.3)
Creatinine, Ser: 0.94 mg/dL (ref 0.61–1.24)
Glucose, Bld: 83 mg/dL (ref 65–99)
POTASSIUM: 3.5 mmol/L (ref 3.5–5.1)
Sodium: 141 mmol/L (ref 135–145)
Total Bilirubin: 0.7 mg/dL (ref 0.3–1.2)
Total Protein: 6.6 g/dL (ref 6.5–8.1)

## 2017-02-11 LAB — I-STAT CG4 LACTIC ACID, ED: LACTIC ACID, VENOUS: 1.59 mmol/L (ref 0.5–1.9)

## 2017-02-11 MED ORDER — IOPAMIDOL (ISOVUE-300) INJECTION 61%
INTRAVENOUS | Status: AC
  Administered 2017-02-11: 75 mL
  Filled 2017-02-11: qty 75

## 2017-02-11 MED ORDER — SODIUM CHLORIDE 0.9 % IV BOLUS (SEPSIS)
500.0000 mL | Freq: Once | INTRAVENOUS | Status: AC
  Administered 2017-02-11: 500 mL via INTRAVENOUS

## 2017-02-11 MED ORDER — SODIUM CHLORIDE 0.9 % IV SOLN
INTRAVENOUS | Status: DC
  Administered 2017-02-11: 16:00:00 via INTRAVENOUS

## 2017-02-11 MED ORDER — VANCOMYCIN HCL IN DEXTROSE 1-5 GM/200ML-% IV SOLN
1000.0000 mg | Freq: Once | INTRAVENOUS | Status: AC
  Administered 2017-02-11: 1000 mg via INTRAVENOUS
  Filled 2017-02-11 (×2): qty 200

## 2017-02-11 MED ORDER — DEXTROSE 5 % IV SOLN
1.0000 g | Freq: Once | INTRAVENOUS | Status: AC
  Administered 2017-02-11: 1 g via INTRAVENOUS
  Filled 2017-02-11: qty 1

## 2017-02-11 MED ORDER — VANCOMYCIN HCL 500 MG IV SOLR
500.0000 mg | Freq: Two times a day (BID) | INTRAVENOUS | Status: DC
  Filled 2017-02-11: qty 500

## 2017-02-11 NOTE — Discharge Instructions (Signed)
Head CT negative we discussed the CT of the chest findings. Follow-up with his hematologist oncologist Dr. Earlie Server tomorrow by phone. He was planning a CT of the chest later this week he can look at today's results. Return for any new or worse symptoms. As we discussed no evidence of pneumonia.

## 2017-02-11 NOTE — ED Triage Notes (Signed)
Patient here with increased weakness. Sleeping, and decreased appetite. Patient has lung cancer and coughing and drooling throughout assessment. Daughter reports normally active and out and about.

## 2017-02-11 NOTE — ED Notes (Signed)
Waiting for pharmacy to send pt's vancomycin

## 2017-02-11 NOTE — Progress Notes (Signed)
Pharmacy Antibiotic Note  Marco [name] is a 66 y.o. male admitted on 02/11/2017 with pneumonia.  Pharmacy has been consulted for Vancomycin dosing.  Plan: Vancomycin 1g IV x1 then 500mg  IV every 12 hours.  Goal trough 15-20 mcg/mL. Follow-up if Cefepime should be continued (only 1 time dose)    Temp (24hrs), Avg:98.1 F (36.7 C), Min:98.1 F (36.7 C), Max:98.1 F (36.7 C)   Recent Labs Lab 02/11/17 1131 02/11/17 1251  WBC 7.1  --   CREATININE 0.94  --   LATICACIDVEN  --  1.59    Estimated Creatinine Clearance: 55.9 mL/min (by C-G formula based on SCr of 0.94 mg/dL).    No Known Allergies  Antimicrobials this admission: Vancomycin 6/10 >> Cefepime 6/10 x1  Dose adjustments this admission:   Microbiology results:   Thank you for allowing pharmacy to be a part of this patient's care.  Sloan Leiter, PharmD, BCPS Clinical Pharmacist Clinical phone 02/11/2017 until 3:30 PM - [phone] After hours, please call #[phone] 02/11/2017 1:36 PM

## 2017-02-11 NOTE — ED Notes (Signed)
Patient transported to CT 

## 2017-02-11 NOTE — ED Provider Notes (Addendum)
[facility] DEPT Provider Note   CSN: [id number] Arrival date & time: 02/11/17  1121     History   Chief Complaint Chief Complaint  Patient presents with  . Weakness    HPI Marco [name] is a 66 y.o. male.  Patient brought in by his daughter. Patient has known lung cancer followed by Dr. Earlie Server at [facility] long. Patient's on immunotherapy. Patient does have a history of some dementia as well. Patient also has a history of some mental retardation. Patient with seizures but no seizure problems of late. Patient according to daughter has some worsening mental status in the past few days. Was thickly worse today. Patient's had some drooling for several weeks has not really changed. Patient's hematology oncology notes were reviewed. 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 This was in January 2015. Patient has a persistent mass.      Past Medical History:  Diagnosis Date  . DEMENTIA   . Depression   . Developmental disability    developmentaly delayed  . Encounter for antineoplastic chemotherapy 10/10/2016  . Encounter for antineoplastic immunotherapy 04/05/2015  . Encounter for antineoplastic immunotherapy 12/21/2016  . GERD (gastroesophageal reflux disease)   . Hx of radiation therapy 11/07/13-12/24/13   lung,60Gy/27fx  . Hypertension    no medications, no documented history per caregiver at preadmission  . Lung cancer ([place])   . Mental disorder    sczizophrenia;moderate retardation  . Mental retardation   . Seizure disorder ([place])   . Seizures [facility])     Patient Active Problem List   Diagnosis Date Noted  . Seizure disorder ([place])   . Seizure ([place]) 01/14/2017  . Recurrent seizures ([place]) 01/01/2017  . Seizures ([place]) 01/01/2017  . Dysphagia 01/01/2017  . Encounter for antineoplastic immunotherapy 12/21/2016  . Encounter for antineoplastic chemotherapy 10/10/2016  . Hypertension 09/14/2016  . AKI (acute kidney  injury) ([place]) 04/23/2016  . Acute on chronic respiratory failure with hypoxemia ([place]) 04/23/2016  . Protein-calorie malnutrition, severe ([place]) 01/28/2016  . Altered mental status   . Post-ictal confusion   . Acute encephalopathy 01/25/2016  . Squamous cell carcinoma of lung ([place]) 01/25/2016  . Seizure disorder ([place]) 01/25/2016  . Seizure ([place]) 08/22/2015  . Malnutrition of moderate degree 07/02/2015  . Sepsis ([place]) 07/01/2015  . UTI (urinary tract infection) 07/01/2015  . ARF (acute renal failure) ([place]) 07/01/2015  . Dementia without behavioral disturbance 07/01/2015  . Leukocytosis 07/01/2015  . Hypokalemia 07/01/2015  . Hypernatremia 07/01/2015  . Convulsions/seizures ([place]) 05/20/2015  . Acute respiratory failure with hypoxia ([place])   . Goals of care, counseling/discussion 04/05/2015  . Squamous cell lung cancer ([place]) 09/10/2013  . 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 CATARACT EXTRACTION W/PHACO  07/12/2011   Procedure: CATARACT EXTRACTION PHACO AND INTRAOCULAR LENS PLACEMENT (IOC);  Surgeon: Adonis Brook;  Location: [facility] OR;  Service: Ophthalmology;  Laterality: Left;  . EYE SURGERY     L eye  . RADIOLOGY WITH ANESTHESIA N/A 01/17/2017   Procedure: Elvera Bicker BRAIN WITH AND WITHOUT CONTRAST;  Surgeon: Radiologist, Medication, MD;  Location: [facility];  Service: Radiology;  Laterality: N/A;  . VIDEO BRONCHOSCOPY Bilateral 10/01/2013   Procedure: VIDEO BRONCHOSCOPY WITHOUT FLUORO;  Surgeon: Collene Gobble, MD;  Location: [facility];  Service: Cardiopulmonary;  Laterality: Bilateral;       Home Medications    Prior to  Admission medications   Medication Sig Start Date End Date Taking? Authorizing Provider  divalproex (DEPAKOTE ER) 250 MG 24 hr tablet Take 3 tablets (750 mg total) by mouth 2 (two) times daily. 02/05/17  Yes Melvenia Beam, MD  donepezil (ARICEPT) 10 MG tablet Take 10 mg by  mouth at bedtime.    Yes [provider]  fluticasone (FLONASE) 50 MCG/ACT nasal spray Place 2 sprays into both nostrils daily.   Yes [provider]  HYDROcodone-homatropine (HYCODAN) 5-1.5 MG/5ML syrup Take 5 mLs by mouth every 6 (six) hours as needed for cough. 12/28/16  Yes Ladell Pier, MD  lacosamide (VIMPAT) 200 MG TABS tablet Take 1 tablet (200 mg total) by mouth 2 (two) times daily. 01/08/17  Yes Melvenia Beam, MD  levETIRAcetam (KEPPRA XR) 500 MG 24 hr tablet TAKE THREE TABLETS BY MOUTH TWICE DAILY 02/05/17  Yes Melvenia Beam, MD  LORazepam (ATIVAN) 1 MG tablet Take 2 tablets (2 mg total) by mouth at bedtime. 04/24/16  Yes Johnson, Clanford L, MD  omeprazole (PRILOSEC) 20 MG capsule Take 20 mg by mouth 2 (two) times daily.   Yes [provider]  risperiDONE (RISPERDAL) 1 MG tablet Take 1 tablet (1 mg total) by mouth at bedtime. 08/25/15  Yes Theodis Blaze, MD  traZODone (DESYREL) 50 MG tablet Take 25 mg by mouth at bedtime as needed for sleep.   Yes [provider]  acetaminophen (TYLENOL) 500 MG tablet Take 500 mg by mouth every 4 (four) hours as needed for mild pain or headache.     [provider]  bisacodyl (DULCOLAX) 5 MG EC tablet Take 5 mg by mouth daily as needed for mild constipation.     [provider]  diazepam (DIASTAT ACUDIAL) 10 MG GEL Place 5 mg rectally once as needed for seizure.    [provider]  diazepam (DIASTAT ACUDIAL) 10 MG GEL Place 10 mg rectally once as needed for seizure (may repeat in 4-6 hours once).    [provider]  ketoconazole (NIZORAL) 2 % cream Apply 1 application topically 2 (two) times daily as needed for irritation. Pt applies to buttocks.     [provider]  loperamide (IMODIUM A-D) 2 MG tablet Take 2 mg by mouth every 4 (four) hours as needed for diarrhea or loose stools.    [provider]  polyethylene glycol (MIRALAX / GLYCOLAX) packet Take 17 g by  mouth daily as needed for mild constipation or moderate constipation.    [provider]  prochlorperazine (COMPAZINE) 10 MG tablet Take 10 mg by mouth every 6 (six) hours as needed for nausea or vomiting.    [provider]    Family History Family History  Problem Relation Age of Onset  . Hypertension Mother   . Hypertension Father     Social History Social History  Substance Use Topics  . Smoking status: Former Smoker    Packs/day: 0.50    Years: 35.00    Types: Cigarettes    Quit date: 09/07/2013  . Smokeless tobacco: Never Used  . Alcohol use No     Allergies   Patient has no known allergies.   Review of Systems Review of Systems  Unable to perform ROS: Mental status change     Physical Exam Updated Vital Signs BP 105/65   Pulse 87   Temp 98.1 F (36.7 C) (Oral)   Resp (!) 21   SpO2 100%   Physical Exam  Constitutional: He appears well-developed and well-nourished. No distress.  HENT:  Head: Normocephalic and atraumatic.  Right Ear: External ear normal.  Mouth/Throat: Oropharynx is clear and moist.  Eyes: EOM are normal. Pupils are equal, round, and reactive to light.  Neck: Normal range of motion.  Cardiovascular: Normal rate, regular rhythm and normal heart sounds.   Pulmonary/Chest: Effort normal and breath sounds normal. No respiratory distress.  Abdominal: Soft. Bowel sounds are normal. There is no tenderness.  Musculoskeletal: Normal range of motion. He exhibits no edema.  Neurological: He is alert. He exhibits normal muscle tone. Coordination normal.  Skin: Skin is warm.  Nursing note and vitals reviewed.    ED Treatments / Results  Labs (all labs ordered are listed, but only abnormal results are displayed) Labs Reviewed  COMPREHENSIVE METABOLIC PANEL - Abnormal; Notable for the following:       Result Value   Calcium 8.5 (*)    Albumin 2.8 (*)    AST 13 (*)    ALT 12 (*)    All other components within normal limits    CBC WITH DIFFERENTIAL/PLATELET - Abnormal; Notable for the following:    RBC 4.11 (*)    Hemoglobin 11.3 (*)    HCT 36.4 (*)    All other components within normal limits  URINALYSIS, ROUTINE W REFLEX MICROSCOPIC - Abnormal; Notable for the following:    Ketones, ur 20 (*)    All other components within normal limits  I-STAT CG4 LACTIC ACID, ED  I-STAT CG4 LACTIC ACID, ED    EKG  EKG Interpretation None       Radiology Dg Chest 2 View  Result Date: 02/11/2017 CLINICAL DATA:  Cough and altered mental status. History of lung carcinoma EXAM: CHEST  2 VIEW COMPARISON:  Chest CT September 07, 2016 and chest radiograph December 01, 2016 FINDINGS: There is consolidation and volume loss in the left upper lobe with findings concerning for suprahilar mass on the left, unchanged. There is new opacity in the posterior left base, a likely focus of pneumonia. Right lung is clear. Heart size is normal. No adenopathy is seen on the right. Pulmonary vascularity appears normal on the right and is distorted on the left. No bone lesions. IMPRESSION: Mass with volume loss left upper lobe persists. Posterior left base consolidation is present, felt to represent pneumonia. Right lung clear. Stable cardiac silhouette. Electronically Signed   By: Lowella Grip III M.D.   On: 02/11/2017 12:40   Ct Chest W Contrast  Result Date: 02/11/2017 CLINICAL DATA:  Nonproductive cough, history of lung cancer EXAM: CT CHEST WITH CONTRAST TECHNIQUE: Multidetector CT imaging of the chest was performed during intravenous contrast administration. CONTRAST:  21mL ISOVUE-300 IOPAMIDOL (ISOVUE-300) INJECTION 61% COMPARISON:  Chest radiograph dated 02/11/2017. PET-CT dated 09/29/2016. FINDINGS: Cardiovascular: Heart is normal in size. Small pericardial effusion. No evidence of thoracic aortic aneurysm. Mild atherosclerotic calcifications. Mediastinum/Nodes: No suspicious mediastinal lymphadenopathy. Abnormal soft tissue in the left  hilar region (series 3/image 69). Visualized thyroid is unremarkable. Lungs/Pleura: 6.7 x 4.2 cm medial left upper lobe mass (series 3/image 56), previously 7.1 x 3.2 cm, favored to be mildly increased. Mass abuts the mediastinum and extends to the left hilar region (series 3/image 69). Moderate left pleural effusion, new/ increased. Right lung is clear. No pneumothorax. Upper Abdomen: Visualized upper abdomen is notable for a 2.8 cm hypoenhancing lesion in segment 4A (series 3/ image 126), new, suspicious for metastasis. Musculoskeletal: Visualized osseous structures are within normal limits. IMPRESSION:  6.7 cm mass along the medial left upper lobe and extending to the left hilar region, corresponding to known primary bronchogenic neoplasm, possibly mildly increased. Moderate left pleural effusion, new/ increased. 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 Electronically Signed   By: Julian Hy M.D.   On: 02/11/2017 15:45    Procedures Procedures (including critical care time)  Medications Ordered in ED Medications  0.9 %  sodium chloride infusion ( Intravenous New Bag/Given 02/11/17 1548)  vancomycin (VANCOCIN) IVPB 1000 mg/200 mL premix (1,000 mg Intravenous New Bag/Given 02/11/17 1607)    Followed by  vancomycin (VANCOCIN) 500 mg in sodium chloride 0.9 % 100 mL IVPB (not administered)  sodium chloride 0.9 % bolus 500 mL (500 mLs Intravenous New Bag/Given 02/11/17 1405)  ceFEPIme (MAXIPIME) 1 g in dextrose 5 % 50 mL IVPB (0 g Intravenous Stopped 02/11/17 1537)  iopamidol (ISOVUE-300) 61 % injection (75 mLs  Contrast Given 02/11/17 1512)     Initial Impression / Assessment and Plan / ED Course  I have reviewed the triage vital signs and the nursing notes.  Pertinent labs & imaging results that were available during my care of the patient were reviewed by me and considered in my medical decision making (see chart for details).     Workup here chest x-ray raised concerns for a  left-sided lower lobe pneumonia as long with the lung mass which was significant change. CT of the chest however showed no pneumonia. Patient's mental status has improved here. The daughter states still not back to baseline. Patient has not had a CT of his head for several months. So that was ordered to make sure there is no signs of any metastatic disease. In addition there is a new or worsened left pleural effusion.  If head CT is negative patient is stable for discharge home.  Head CT negative patient stable for discharge home.  Daughter will call Dr. Lew Dawes office hematology oncology in the morning. Close follow-up with them will be important.  Final Clinical Impressions(s) / ED Diagnoses   Final diagnoses:  Malignant neoplasm of upper lobe of left lung (HCC)  Confusion    New Prescriptions New Prescriptions   No medications on file     Fredia Sorrow, MD 02/11/17 Dionicia Abler    Fredia Sorrow, MD 02/11/17 1818    Fredia Sorrow, MD 02/11/17 [phone]

## 2017-02-13 ENCOUNTER — Other Ambulatory Visit: Payer: Self-pay | Admitting: Medical Oncology

## 2017-02-13 ENCOUNTER — Ambulatory Visit (HOSPITAL_COMMUNITY): Admission: RE | Admit: 2017-02-13 | Payer: Medicare Other | Source: Ambulatory Visit

## 2017-02-13 ENCOUNTER — Telehealth: Payer: Self-pay | Admitting: Medical Oncology

## 2017-02-13 DIAGNOSIS — C3490 Malignant neoplasm of unspecified part of unspecified bronchus or lung: Secondary | ICD-10-CM

## 2017-02-13 MED ORDER — HYDROCODONE-HOMATROPINE 5-1.5 MG/5ML PO SYRP: 5.0000 mL | ORAL_SOLUTION | Freq: Four times a day (QID) | ORAL | 0 refills | Status: DC | PRN

## 2017-02-13 NOTE — Telephone Encounter (Signed)
CT scan cancelled today -caregiver notified.

## 2017-02-15 ENCOUNTER — Telehealth: Payer: Self-pay | Admitting: Emergency Medicine

## 2017-02-15 ENCOUNTER — Ambulatory Visit: Payer: Medicare Other

## 2017-02-15 ENCOUNTER — Ambulatory Visit (HOSPITAL_BASED_OUTPATIENT_CLINIC_OR_DEPARTMENT_OTHER): Payer: Medicare Other | Admitting: Internal Medicine

## 2017-02-15 ENCOUNTER — Telehealth: Payer: Self-pay | Admitting: Internal Medicine

## 2017-02-15 ENCOUNTER — Encounter: Payer: Self-pay | Admitting: Internal Medicine

## 2017-02-15 ENCOUNTER — Other Ambulatory Visit (HOSPITAL_BASED_OUTPATIENT_CLINIC_OR_DEPARTMENT_OTHER): Payer: Medicare Other

## 2017-02-15 VITALS — BP 104/77 | HR 117 | Resp 18 | Ht 72.0 in | Wt 120.6 lb

## 2017-02-15 DIAGNOSIS — J918 Pleural effusion in other conditions classified elsewhere: Secondary | ICD-10-CM | POA: Diagnosis not present

## 2017-02-15 DIAGNOSIS — C3492 Malignant neoplasm of unspecified part of left bronchus or lung: Secondary | ICD-10-CM

## 2017-02-15 DIAGNOSIS — C3412 Malignant neoplasm of upper lobe, left bronchus or lung: Secondary | ICD-10-CM | POA: Diagnosis present

## 2017-02-15 DIAGNOSIS — R530 Neoplastic (malignant) related fatigue: Secondary | ICD-10-CM

## 2017-02-15 DIAGNOSIS — E440 Moderate protein-calorie malnutrition: Secondary | ICD-10-CM

## 2017-02-15 DIAGNOSIS — R5382 Chronic fatigue, unspecified: Secondary | ICD-10-CM | POA: Diagnosis not present

## 2017-02-15 DIAGNOSIS — Z5111 Encounter for antineoplastic chemotherapy: Secondary | ICD-10-CM

## 2017-02-15 LAB — CBC WITH DIFFERENTIAL/PLATELET
BASO%: 0.3 % (ref 0.0–2.0)
BASOS ABS: 0 10*3/uL (ref 0.0–0.1)
EOS ABS: 0 10*3/uL (ref 0.0–0.5)
EOS%: 0.1 % (ref 0.0–7.0)
HEMATOCRIT: 35.6 % — AB (ref 38.4–49.9)
HEMOGLOBIN: 11.5 g/dL — AB (ref 13.0–17.1)
LYMPH#: 0.7 10*3/uL — AB (ref 0.9–3.3)
LYMPH%: 8.5 % — ABNORMAL LOW (ref 14.0–49.0)
MCH: 27.5 pg (ref 27.2–33.4)
MCHC: 32.2 g/dL (ref 32.0–36.0)
MCV: 85.4 fL (ref 79.3–98.0)
MONO#: 0.8 10*3/uL (ref 0.1–0.9)
MONO%: 9.4 % (ref 0.0–14.0)
NEUT#: 6.9 10*3/uL — ABNORMAL HIGH (ref 1.5–6.5)
NEUT%: 81.7 % — ABNORMAL HIGH (ref 39.0–75.0)
Platelets: 217 10*3/uL (ref 140–400)
RBC: 4.17 10*6/uL — ABNORMAL LOW (ref 4.20–5.82)
RDW: 15 % — AB (ref 11.0–14.6)
WBC: 8.4 10*3/uL (ref 4.0–10.3)

## 2017-02-15 LAB — COMPREHENSIVE METABOLIC PANEL
ALBUMIN: 2.7 g/dL — AB (ref 3.5–5.0)
ALK PHOS: 56 U/L (ref 40–150)
ALT: 28 U/L (ref 0–55)
AST: 27 U/L (ref 5–34)
Anion Gap: 9 mEq/L (ref 3–11)
BILIRUBIN TOTAL: 0.26 mg/dL (ref 0.20–1.20)
BUN: 12.3 mg/dL (ref 7.0–26.0)
CALCIUM: 9.1 mg/dL (ref 8.4–10.4)
CO2: 29 mEq/L (ref 22–29)
Chloride: 105 mEq/L (ref 98–109)
Creatinine: 0.8 mg/dL (ref 0.7–1.3)
GLUCOSE: 69 mg/dL — AB (ref 70–140)
POTASSIUM: 3.5 meq/L (ref 3.5–5.1)
SODIUM: 143 meq/L (ref 136–145)
TOTAL PROTEIN: 7 g/dL (ref 6.4–8.3)

## 2017-02-15 LAB — TSH: TSH: 1.039 m(IU)/L (ref 0.320–4.118)

## 2017-02-15 NOTE — Telephone Encounter (Signed)
Per 6/14 LOS - No follow-up visit is needed

## 2017-02-15 NOTE — Telephone Encounter (Signed)
Referral called to [facility]. Dr Vista Mink will be attending.

## 2017-02-15 NOTE — Progress Notes (Signed)
[facility] [phone]   [phone]) [phone]  OFFICE PROGRESS NOTE  Leamon Arnt, MD [address] [place] [address]  DIAGNOSIS: Stage IIA (T2a., N1, M0) non-small cell lung cancer, squamous cell carcinoma presented with central left upper lobe mass as well as hilar lymphadenopathy diagnosed in January of 2015.  PRIOR THERAPY:  1. Concurrent chemoradiation with weekly carboplatin for AUC of 2 and paclitaxel 45 mg/M2, status post 7 cycles. Last cycle was given 12/22/2013  2. Systemic chemotherapy with carboplatin for AUC of 5 and paclitaxel 175 MG/M2 with Neulasta support every 3 weeks. Status post 3 cycles. 3. Immunotherapy with Nivolumab 3 mg/kg given every 2 weeks. Status post 9 cycles. Discontinued secondary to noncompliance but the patient has a stable disease. 4. Treatment again with immunotherapy with Nivolumab 240 MG IV every 2 weeks. First dose 12/07/2016. Status post 4 cycles.  CURRENT THERAPY: Palliative and hospice care  INTERVAL HISTORY: Marco [name] 66 y.o. male returns to the clinic today for follow-up visit accompanied by his caregiver. The patient is feeling fine today with no specific complaints. He denied having any chest pain but continues to have shortness of breath with exertion and dry cough with no hemoptysis. His condition is declining. He is more fatigued and sleeps a lot. He denied having any current nausea, vomiting, diarrhea or constipation. He has no fever or chills. He had repeat CT scan of the head and chest performed recently and he is here for evaluation and discussion of his scan results and treatment options.  MEDICAL HISTORY: Past Medical History:  Diagnosis Date  . DEMENTIA   . Depression   . Developmental disability    developmentaly delayed  . Encounter for antineoplastic chemotherapy 10/10/2016  . Encounter for antineoplastic immunotherapy 04/05/2015  . Encounter for antineoplastic  immunotherapy 12/21/2016  . GERD (gastroesophageal reflux disease)   . Hx of radiation therapy 11/07/13-12/24/13   lung,60Gy/4fx  . Hypertension    no medications, no documented history per caregiver at preadmission  . Lung cancer ([facility])   . Mental disorder    sczizophrenia;moderate retardation  . Mental retardation   . Seizure disorder ([facility])   . Seizures ([facility])     ALLERGIES:  has No Known Allergies.  MEDICATIONS:  Current Outpatient Prescriptions  Medication Sig Dispense Refill  . acetaminophen (TYLENOL) 500 MG tablet Take 500 mg by mouth every 4 (four) hours as needed for mild pain or headache.     . bisacodyl (DULCOLAX) 5 MG EC tablet Take 5 mg by mouth daily as needed for mild constipation.     . diazepam (DIASTAT ACUDIAL) 10 MG GEL Place 5 mg rectally once as needed for seizure.    . diazepam (DIASTAT ACUDIAL) 10 MG GEL Place 10 mg rectally once as needed for seizure (may repeat in 4-6 hours once).    . divalproex (DEPAKOTE ER) 250 MG 24 hr tablet Take 3 tablets (750 mg total) by mouth 2 (two) times daily. 180 tablet 0  . donepezil (ARICEPT) 10 MG tablet Take 10 mg by mouth at bedtime.     . fluticasone (FLONASE) 50 MCG/ACT nasal spray Place 2 sprays into both nostrils daily.    Marland Kitchen HYDROcodone-homatropine (HYCODAN) 5-1.5 MG/5ML syrup Take 5 mLs by mouth every 6 (six) hours as needed for cough. 120 mL 0  . ketoconazole (NIZORAL) 2 % cream Apply 1 application topically 2 (two) times daily as needed for irritation. Pt applies to  buttocks.     . lacosamide (VIMPAT) 200 MG TABS tablet Take 1 tablet (200 mg total) by mouth 2 (two) times daily. 60 tablet 1  . levETIRAcetam (KEPPRA XR) 500 MG 24 hr tablet TAKE THREE TABLETS BY MOUTH TWICE DAILY 180 tablet 0  . loperamide (IMODIUM A-D) 2 MG tablet Take 2 mg by mouth every 4 (four) hours as needed for diarrhea or loose stools.    Marland Kitchen LORazepam (ATIVAN) 1 MG tablet Take 2 tablets (2 mg total) by mouth at bedtime. 30 tablet 0  . omeprazole  (PRILOSEC) 20 MG capsule Take 20 mg by mouth 2 (two) times daily.    . polyethylene glycol (MIRALAX / GLYCOLAX) packet Take 17 g by mouth daily as needed for mild constipation or moderate constipation.    . prochlorperazine (COMPAZINE) 10 MG tablet Take 10 mg by mouth every 6 (six) hours as needed for nausea or vomiting.    . risperiDONE (RISPERDAL) 1 MG tablet Take 1 tablet (1 mg total) by mouth at bedtime. 30 tablet 0  . traZODone (DESYREL) 50 MG tablet Take 25 mg by mouth at bedtime as needed for sleep.     No current facility-administered medications for this visit.     SURGICAL HISTORY:  Past Surgical History:  Procedure Laterality Date  . CATARACT EXTRACTION W/PHACO  07/12/2011   Procedure: CATARACT EXTRACTION PHACO AND INTRAOCULAR LENS PLACEMENT (IOC);  Surgeon: Adonis Brook;  Location: [facility];  Service: Ophthalmology;  Laterality: Left;  . EYE SURGERY     L eye  . RADIOLOGY WITH ANESTHESIA N/A 01/17/2017   Procedure: Elvera Bicker BRAIN WITH AND WITHOUT CONTRAST;  Surgeon: Radiologist, Medication, MD;  Location: [facility];  Service: Radiology;  Laterality: N/A;  . VIDEO BRONCHOSCOPY Bilateral 10/01/2013   Procedure: VIDEO BRONCHOSCOPY WITHOUT FLUORO;  Surgeon: Collene Gobble, MD;  Location: [facility];  Service: Cardiopulmonary;  Laterality: Bilateral;    REVIEW OF SYSTEMS:  Constitutional: positive for fatigue Eyes: negative Ears, nose, mouth, throat, and face: negative Respiratory: positive for cough and dyspnea on exertion Cardiovascular: negative Gastrointestinal: negative Genitourinary:negative Integument/breast: negative Hematologic/lymphatic: negative Musculoskeletal:positive for muscle weakness Neurological: negative Behavioral/Psych: negative Endocrine: negative Allergic/Immunologic: negative   PHYSICAL EXAMINATION: General appearance: alert, cooperative, fatigued and no distress Head: Normocephalic, without obvious abnormality, atraumatic Neck: no adenopathy, no JVD, supple,  symmetrical, trachea midline and thyroid not enlarged, symmetric, no tenderness/mass/nodules Lymph nodes: Cervical, supraclavicular, and axillary nodes normal. Resp: diminished breath sounds LLL and dullness to percussion LLL Back: symmetric, no curvature. ROM normal. No CVA tenderness. Cardio: regular rate and rhythm, S1, S2 normal, no murmur, click, rub or gallop GI: soft, non-tender; bowel sounds normal; no masses,  no organomegaly Extremities: extremities normal, atraumatic, no cyanosis or edema Neurologic: Alert and oriented X 3, normal strength and tone. Normal symmetric reflexes. Normal coordination and gait  ECOG PERFORMANCE STATUS: 1 - Symptomatic but completely ambulatory  Blood pressure 104/77, pulse (!) 117, resp. rate 18, height 6' (1.829 m), weight 120 lb 9.6 oz (54.7 kg), SpO2 100 %.  LABORATORY DATA: Lab Results  Component Value Date   WBC 8.4 02/15/2017   HGB 11.5 (L) 02/15/2017   HCT 35.6 (L) 02/15/2017   MCV 85.4 02/15/2017   PLT 217 02/15/2017      Chemistry      Component Value Date/Time   NA 143 02/15/2017 1158   K 3.5 02/15/2017 1158   CL 104 02/11/2017 1131   CO2 29 02/15/2017 1158   BUN 12.3 02/15/2017  1158   CREATININE 0.8 02/15/2017 1158      Component Value Date/Time   CALCIUM 9.1 02/15/2017 1158   ALKPHOS 56 02/15/2017 1158   AST 27 02/15/2017 1158   ALT 28 02/15/2017 1158   BILITOT 0.26 02/15/2017 1158       RADIOGRAPHIC STUDIES: Dg Chest 2 View  Result Date: 02/11/2017 CLINICAL DATA:  Cough and altered mental status. History of lung carcinoma EXAM: CHEST  2 VIEW COMPARISON:  Chest CT September 07, 2016 and chest radiograph December 01, 2016 FINDINGS: There is consolidation and volume loss in the left upper lobe with findings concerning for suprahilar mass on the left, unchanged. There is new opacity in the posterior left base, a likely focus of pneumonia. Right lung is clear. Heart size is normal. No adenopathy is seen on the right. Pulmonary  vascularity appears normal on the right and is distorted on the left. No bone lesions. IMPRESSION: Mass with volume loss left upper lobe persists. Posterior left base consolidation is present, felt to represent pneumonia. Right lung clear. Stable cardiac silhouette. Electronically Signed   By: Lowella Grip III M.D.   On: 02/11/2017 12:40   Ct Head Wo Contrast  Result Date: 02/11/2017 CLINICAL DATA:  Progressive weakness. Personal history of lung cancer. Patient was given IV contrast for a CT of the chest 2.5 hours previous. EXAM: CT HEAD WITHOUT CONTRAST TECHNIQUE: Contiguous axial images were obtained from the base of the skull through the vertex without intravenous contrast. COMPARISON:  None. FINDINGS: Brain: Mild atrophy is stable. No focal lesions are present. There is no residual enhancement from earlier contrast or significant focal edema to suggest metastatic disease to the brain. Ventricles are stable and normal. No significant extra-axial fluid collection is present. Vascular: Stable atherosclerotic calcifications are evident in the left cavernous internal carotid artery. There is no hyperdense vessel. Skull: The calvarium is intact. No focal lytic or blastic lesions are present. No significant extracranial soft tissue injury is present. Sinuses/Orbits: The paranasal sinuses are clear. The mastoid air cells are clear bilaterally. The external auditory canals are opacified. The patient is status post bilateral lens replacements. The globes and orbits are within normal limits. IMPRESSION: 1. No acute intracranial abnormality or significant interval change. 2. Stable mild atrophy. 3. No evidence for metastatic disease to the brain. 4. Soft tissue in the external auditory canals bilaterally, likely cerumen. Electronically Signed   By: San Morelle M.D.   On: 02/11/2017 18:14   Ct Chest W Contrast  Result Date: 02/11/2017 CLINICAL DATA:  Nonproductive cough, history of lung cancer EXAM: CT  CHEST WITH CONTRAST TECHNIQUE: Multidetector CT imaging of the chest was performed during intravenous contrast administration. CONTRAST:  56mL ISOVUE-300 IOPAMIDOL (ISOVUE-300) INJECTION 61% COMPARISON:  Chest radiograph dated 02/11/2017. PET-CT dated 09/29/2016. FINDINGS: Cardiovascular: Heart is normal in size. Small pericardial effusion. No evidence of thoracic aortic aneurysm. Mild atherosclerotic calcifications. Mediastinum/Nodes: No suspicious mediastinal lymphadenopathy. Abnormal soft tissue in the left hilar region (series 3/image 69). Visualized thyroid is unremarkable. Lungs/Pleura: 6.7 x 4.2 cm medial left upper lobe mass (series 3/image 56), previously 7.1 x 3.2 cm, favored to be mildly increased. Mass abuts the mediastinum and extends to the left hilar region (series 3/image 69). Moderate left pleural effusion, new/ increased. Right lung is clear. No pneumothorax. Upper Abdomen: Visualized upper abdomen is notable for a 2.8 cm hypoenhancing lesion in segment 4A (series 3/ image 126), new, suspicious for metastasis. Musculoskeletal: Visualized osseous structures are within normal limits. IMPRESSION: 6.7  cm mass along the medial left upper lobe and extending to the left hilar region, corresponding to known primary bronchogenic neoplasm, possibly mildly increased. Moderate left pleural effusion, new/ increased. 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 Electronically Signed   By: Julian Hy M.D.   On: 02/11/2017 15:45   Mr Jeri Cos WU Contrast  Result Date: 01/17/2017 CLINICAL DATA:  Seizure. History of lung cancer and mental retardation. EXAM: MRI HEAD WITHOUT AND WITH CONTRAST TECHNIQUE: Multiplanar, multiecho pulse sequences of the brain and surrounding structures were obtained without and with intravenous contrast. CONTRAST:  5mL MULTIHANCE GADOBENATE DIMEGLUMINE 529 MG/ML IV SOLN COMPARISON:  Head CT 01/14/2017 FINDINGS: Brain: There is no evidence of acute infarct, intracranial  hemorrhage, mass, midline shift, or extra-axial fluid collection. There is mild cerebral atrophy. Dedicated temporal lobe imaging demonstrates symmetric mesial temporal lobe atrophy without focal signal abnormality. Minimal hazy periventricular white matter T2 hyperintensity and a few discrete foci of T2 hyperintensity in the cerebral white matter and left pons are nonspecific but may reflect minimal chronic small vessel ischemic disease, not greater than expected for patient's age. No abnormal enhancement is identified. An incidental cavum septum pellucidum et vergae is noted. Vascular: Major intracranial vascular flow voids are preserved. Skull and upper cervical spine: Unremarkable bone marrow signal. Sinuses/Orbits: Bilateral cataract extraction. No significant sinus disease. Other: None. IMPRESSION: 1. No acute intracranial abnormality or metastatic disease identified. 2. Mild cerebral atrophy. Electronically Signed   By: Logan Bores M.D.   On: 01/17/2017 14:15   ASSESSMENT AND PLAN:  This is a very pleasant 66 years old African-American male with recurrent non-small cell lung cancer, squamous cell carcinoma that was initially diagnosed as stage IIIa status post course of concurrent chemoradiation followed by consolidation chemotherapy. He had evidence for disease progression and was treated with immunotherapy with Nivolumab initially for 9 cycles with stable disease but was discontinued secondary to his intolerance. The patient developed evidence for disease progression and resume treatment with Nivolumab for 4 cycles. He had repeat CT scan of the head and chest performed recently. I personally and independently reviewed the scan images and discuss the results with the patient and his caregiver today. Unfortunately his CT scan of the chest showed evidence for disease progression with increase in left pleural effusion as well as new hepatic lesion. I had a lengthy discussion with the patient and his  caregiver about his current condition and treatment options. I gave them the option of palliative care and hospice referral versus consideration of systemic chemotherapy with docetaxel and Cyramza. The caregiver mentioned that the patient is too weak to tolerate any further treatment and they are more interested in the palliative care option. I will refer him to the palliative and hospice care of [facility]. For the left pleural effusion, I gave him the option of drainage of the effusion but he is currently asymptomatic except for the cough. They would consider the thoracentesis if the patient becomes more short of breath. I will see him on as-needed basis at this point. They were advised to call if he has any concerning symptoms in the interval. The patient voices understanding of current disease status and treatment options and is in agreement with the current care plan. All questions were answered. The patient knows to call the clinic with any problems, questions or concerns. We can certainly see the patient much sooner if necessary.  Disclaimer: This note was dictated with voice recognition software. Similar sounding words can  inadvertently be transcribed and may not be corrected upon review.       

## 2017-02-20 ENCOUNTER — Telehealth: Payer: Self-pay

## 2017-02-20 ENCOUNTER — Ambulatory Visit: Payer: Medicare Other | Admitting: Neurology

## 2017-02-20 ENCOUNTER — Telehealth: Payer: Self-pay | Admitting: Neurology

## 2017-02-20 NOTE — Telephone Encounter (Signed)
Pt's caregiver [name] said the pt is under Hospice Care for the past 5 days. He said the pt is too weak and does not feel well enough to come to the appt today.

## 2017-02-20 NOTE — Telephone Encounter (Signed)
Caregiver called and cancelled same day appt. Pt is now under Hospice care.

## 2017-02-21 ENCOUNTER — Encounter: Payer: Self-pay | Admitting: Neurology

## 2017-02-27 ENCOUNTER — Emergency Department (HOSPITAL_COMMUNITY): Payer: Medicare Other

## 2017-02-27 ENCOUNTER — Emergency Department (HOSPITAL_COMMUNITY)
Admission: EM | Admit: 2017-02-27 | Discharge: 2017-02-27 | Disposition: A | Discharge status: Home / Self Care | Payer: Medicare Other | Attending: Emergency Medicine | Admitting: Emergency Medicine

## 2017-02-27 DIAGNOSIS — W108XXA Fall (on) (from) other stairs and steps, initial encounter: Secondary | ICD-10-CM

## 2017-02-27 DIAGNOSIS — W109XXA Fall (on) (from) unspecified stairs and steps, initial encounter: Secondary | ICD-10-CM | POA: Diagnosis not present

## 2017-02-27 DIAGNOSIS — Y92019 Unspecified place in single-family (private) house as the place of occurrence of the external cause: Secondary | ICD-10-CM | POA: Insufficient documentation

## 2017-02-27 DIAGNOSIS — J90 Pleural effusion, not elsewhere classified: Secondary | ICD-10-CM | POA: Diagnosis not present

## 2017-02-27 DIAGNOSIS — Y939 Activity, unspecified: Secondary | ICD-10-CM | POA: Insufficient documentation

## 2017-02-27 DIAGNOSIS — Y999 Unspecified external cause status: Secondary | ICD-10-CM | POA: Insufficient documentation

## 2017-02-27 DIAGNOSIS — R410 Disorientation, unspecified: Secondary | ICD-10-CM | POA: Diagnosis not present

## 2017-02-27 DIAGNOSIS — R627 Adult failure to thrive: Secondary | ICD-10-CM | POA: Diagnosis present

## 2017-02-27 DIAGNOSIS — R402431 Glasgow coma scale score 3-8, in the field [EMT or ambulance]: Secondary | ICD-10-CM | POA: Insufficient documentation

## 2017-02-27 DIAGNOSIS — R000 Tachycardia, unspecified: Secondary | ICD-10-CM | POA: Insufficient documentation

## 2017-02-27 DIAGNOSIS — Z79899 Other long term (current) drug therapy: Secondary | ICD-10-CM | POA: Insufficient documentation

## 2017-02-27 LAB — BPAM RBC
BLOOD PRODUCT EXPIRATION DATE: 201807032359
Blood Product Expiration Date: 201807022359
ISSUE DATE / TIME: 201806261038
ISSUE DATE / TIME: 201806261038
UNIT TYPE AND RH: 9500
Unit Type and Rh: 9500

## 2017-02-27 LAB — COMPREHENSIVE METABOLIC PANEL
ALBUMIN: 2.1 g/dL — AB (ref 3.5–5.0)
ALT: 29 U/L (ref 17–63)
ANION GAP: 6 (ref 5–15)
AST: 26 U/L (ref 15–41)
Alkaline Phosphatase: 53 U/L (ref 38–126)
BUN: 10 mg/dL (ref 6–20)
CALCIUM: 8.1 mg/dL — AB (ref 8.9–10.3)
CO2: 32 mmol/L (ref 22–32)
Chloride: 106 mmol/L (ref 101–111)
Creatinine, Ser: 0.9 mg/dL (ref 0.61–1.24)
GFR calc non Af Amer: >60 mL/min (ref >60)
GLUCOSE: 103 mg/dL — AB (ref 65–99)
POTASSIUM: 3.9 mmol/L (ref 3.5–5.1)
SODIUM: 144 mmol/L (ref 135–145)
Total Bilirubin: 0.2 mg/dL — ABNORMAL LOW (ref 0.3–1.2)
Total Protein: 5.9 g/dL — ABNORMAL LOW (ref 6.5–8.1)

## 2017-02-27 LAB — I-STAT CHEM 8, ED
BUN: 12 mg/dL (ref 6–20)
CALCIUM ION: 1.14 mmol/L — AB (ref 1.15–1.40)
Chloride: 104 mmol/L (ref 101–111)
Creatinine, Ser: 0.9 mg/dL (ref 0.61–1.24)
GLUCOSE: 93 mg/dL (ref 65–99)
HCT: 37 % — ABNORMAL LOW (ref 39.0–52.0)
Hemoglobin: 12.6 g/dL — ABNORMAL LOW (ref 13.0–17.0)
Potassium: 3.7 mmol/L (ref 3.5–5.1)
SODIUM: 146 mmol/L — AB (ref 135–145)
TCO2: 31 mmol/L (ref 0–100)

## 2017-02-27 LAB — I-STAT ARTERIAL BLOOD GAS, ED
Acid-Base Excess: 12 mmol/L — ABNORMAL HIGH (ref 0.0–2.0)
Bicarbonate: 37.3 mmol/L — ABNORMAL HIGH (ref 20.0–28.0)
O2 Saturation: 100 %
Patient temperature: 98.6
TCO2: 39 mmol/L (ref 0–100)
pCO2 arterial: 49.6 mmHg — ABNORMAL HIGH (ref 32.0–48.0)
pH, Arterial: 7.484 — ABNORMAL HIGH (ref 7.350–7.450)
pO2, Arterial: 378 mmHg — ABNORMAL HIGH (ref 83.0–108.0)

## 2017-02-27 LAB — URINALYSIS, ROUTINE W REFLEX MICROSCOPIC
Bilirubin Urine: NEGATIVE
Glucose, UA: NEGATIVE mg/dL
HGB URINE DIPSTICK: NEGATIVE
Ketones, ur: 5 mg/dL — AB
LEUKOCYTES UA: NEGATIVE
Nitrite: NEGATIVE
PROTEIN: NEGATIVE mg/dL
pH: 6 (ref 5.0–8.0)

## 2017-02-27 LAB — PREPARE FRESH FROZEN PLASMA
Unit division: 0
Unit division: 0

## 2017-02-27 LAB — CBC
HEMATOCRIT: 33.9 % — AB (ref 39.0–52.0)
HEMOGLOBIN: 10.1 g/dL — AB (ref 13.0–17.0)
MCH: 25.8 pg — AB (ref 26.0–34.0)
MCHC: 29.5 g/dL — AB (ref 30.0–36.0)
MCV: 87.6 fL (ref 78.0–100.0)
Platelets: 200 10*3/uL (ref 150–400)
RBC: 3.87 MIL/uL — AB (ref 4.22–5.81)
RDW: 14.6 % (ref 11.5–15.5)
WBC: 8.3 10*3/uL (ref 4.0–10.5)

## 2017-02-27 LAB — BLOOD PRODUCT ORDER (VERBAL) VERIFICATION

## 2017-02-27 LAB — SAMPLE TO BLOOD BANK

## 2017-02-27 LAB — BPAM FFP
BLOOD PRODUCT EXPIRATION DATE: 201806272359
Blood Product Expiration Date: 201806272359
ISSUE DATE / TIME: 201806261043
ISSUE DATE / TIME: 201806261043
UNIT TYPE AND RH: 8400
Unit Type and Rh: 2800

## 2017-02-27 LAB — PROTIME-INR
INR: 1.22
PROTHROMBIN TIME: 15.4 s — AB (ref 11.4–15.2)

## 2017-02-27 LAB — I-STAT CG4 LACTIC ACID, ED: LACTIC ACID, VENOUS: 3.46 mmol/L — AB (ref 0.5–1.9)

## 2017-02-27 LAB — ETHANOL

## 2017-02-27 LAB — CDS SEROLOGY

## 2017-02-27 MED ORDER — LEVOFLOXACIN 500 MG PO TABS
500.0000 mg | ORAL_TABLET | Freq: Once | ORAL | Status: AC
  Administered 2017-02-27: 500 mg via ORAL
  Filled 2017-02-27: qty 1

## 2017-02-27 MED ORDER — IOPAMIDOL (ISOVUE-300) INJECTION 61%
100.0000 mL | Freq: Once | INTRAVENOUS | Status: AC | PRN
  Administered 2017-02-27: 100 mL via INTRAVENOUS

## 2017-02-27 MED ORDER — MIDAZOLAM HCL 2 MG/2ML IJ SOLN
INTRAMUSCULAR | Status: AC
  Filled 2017-02-27: qty 2

## 2017-02-27 MED ORDER — LEVOFLOXACIN 500 MG PO TABS: 500.0000 mg | ORAL_TABLET | Freq: Every day | ORAL | 0 refills | Status: DC

## 2017-02-27 MED ORDER — SODIUM CHLORIDE 0.9 % IV BOLUS (SEPSIS)
1000.0000 mL | Freq: Once | INTRAVENOUS | Status: AC
Start: 2017-02-27 — End: 2017-02-27
  Administered 2017-02-27: 1000 mL via INTRAVENOUS

## 2017-02-27 MED ORDER — KETAMINE HCL-SODIUM CHLORIDE 100-0.9 MG/10ML-% IV SOSY
1.0000 mg/kg | PREFILLED_SYRINGE | Freq: Once | INTRAVENOUS | Status: DC
  Filled 2017-02-27: qty 10

## 2017-02-27 NOTE — Progress Notes (Signed)
1220--[facility] Liaison RN Note  Met with patient, caregiver Marco [name] and Dr. Wilson Singer at bedside. Still awaiting imaging results. Marco [name] expressed that he has concerns about patient returning to split level home with stairs. Marco [name] shares he feels patient has been declining recently. Inquiring about [employer], though pt does not appear to be [employer] appropriate at this time.  Sister Malachy Mood lives in [place] and is [place]. Dr. Wilson Singer will reach out to [facility] to discuss plan of care and address code status.  Will continue to follow and anticipate discharge needs.  Please call with any hospice related questions or concerns.  Thank you, Margaretmary Eddy, [place] [phone]  [place] are on [facility].

## 2017-02-27 NOTE — ED Notes (Signed)
Pt more response now and opens eyes to voice, pt states "homeless" but not answering any questions or following commands, cleared by MD to go over to CT.

## 2017-02-27 NOTE — ED Notes (Signed)
Pt assisted to use urinal.

## 2017-02-27 NOTE — ED Notes (Signed)
Group home worker here now and states that she was downstairs and the pt was upstairs and she heard a thump, found the pt laying on his back unresponsive. Pt has hx of seizures but she's never seen the pt have a seizure. Pt has lung cancer but is no longer being treated and under hospice. Pt normally walks with a cane and is normally alert and oriented x 4.

## 2017-02-27 NOTE — ED Notes (Addendum)
Hospice nurse here now and pt is a FULL CODE. Pt has lung cancer with mets to liver and pleural. Pt has mental disability, IQ of 30-40. Pt is now following some commands. Pt's aid states that the pt's speech is normal and at his baseline at this time. Speech is muffled. Pt's sister Marco [name] is his guardian and can be reached at [phone]

## 2017-02-27 NOTE — Discharge Instructions (Signed)
If Marco [name] seems like he is starting to have problems with his breathing (seems short of breath, coughing a lot, etc) he may benefit from a thoracentesis which is a procedure to remove fluid around his lungs. This should be able to be arranged by his PCP or oncologist as an outpt.

## 2017-02-27 NOTE — ED Notes (Signed)
Dr Wilson Singer in room to speak with pt's caregiver.

## 2017-02-27 NOTE — ED Notes (Signed)
Pt now alert and oriented to self.

## 2017-02-27 NOTE — Progress Notes (Signed)
Responded to level one trauma to support pt. and staff.   Patient fell down stairs .Per EDP patient was with caregiver when accident occurred. Patient was not able to tell staff much. Family was called using numbers on file. All calls went to voice mail.  Pt gone to CT. Chaplain available as needed. Jaclynn Major, [place], [facility], Pager [phone]

## 2017-02-27 NOTE — ED Provider Notes (Signed)
[facility] DEPT Provider Note    By signing my name below, I, Marco [name], attest that this documentation has been prepared under the direction and in the presence of Marco Manifold, MD. Electronically Signed: Bea [name], ED Scribe. 02/27/17. 1:22 PM.    History   Chief Complaint No chief complaint on file.  LEVEL 5 CAVEAT- Full history could not be obtained due to acuity of condition.  HPI  HPI Comments:  Marco [name] is a 66 y.o. male, brought in by EMS, who presents to the Emergency Department after falling down about three stairs at home PTA. The fall was unwitnessed but his home health nurse heard the fall. EMS began bagging the patient due to no pulse ox upon arrival. EMS reports he was found on his back with a GCS of 8. He was responding to painful stimuli initially.   No past medical history on file.  There are no active problems to display for this patient.   No past surgical history on file.     Home Medications    Prior to Admission medications   Medication Sig Start Date End Date Taking? Authorizing Provider  levofloxacin (LEVAQUIN) 500 MG tablet Take 1 tablet (500 mg total) by mouth daily. 02/27/17   Marco Manifold, MD    Family History No family history on file.  Social History Social History  Substance Use Topics  . Smoking status: Not on file  . Smokeless tobacco: Not on file  . Alcohol use Not on file     Allergies   Patient has no allergy information on record.   Review of Systems Review of Systems  Unable to perform ROS: Acuity of condition     Physical Exam Updated Vital Signs BP (!) 110/59   Pulse (!) 111   Temp 98.6 F (37 C) (Tympanic)   Resp 16   Wt 150 lb (68 kg)   SpO2 90%   Physical Exam  Constitutional: He appears well-developed and well-nourished. He appears cachectic. Cervical collar and backboard in place.  Mumbling  HENT:  Head: Normocephalic.  Eyes: EOM are normal.  Cardiovascular:  Tachycardia present.   Mildly tachycardic.  Pulmonary/Chest: Effort normal. He has decreased breath sounds.  Decreased lungs sounds on left side  Abdominal: Soft. He exhibits no distension. There is no tenderness.  Musculoskeletal: Normal range of motion.  No external signs of trauma. Moves all extremities.  Neurological:  Confused.  Nursing note and vitals reviewed.    ED Treatments / Results  DIAGNOSTIC STUDIES: Oxygen Saturation is 90% on face mask, low by my interpretation.   COORDINATION OF CARE: 10:50 AM- Portable CXR ordered and bedside ultrasound performed by Dr. Hulen Skains. Labs and additional imaging ordered. Pt verbalizes understanding and agrees to plan.  Medications  ketamine 100 mg in normal saline 10 mL (10mg /mL) syringe (not administered)  levofloxacin (LEVAQUIN) tablet 500 mg (not administered)  iopamidol (ISOVUE-300) 61 % injection 100 mL (100 mLs Intravenous Contrast Given 02/27/17 1110)  sodium chloride 0.9 % bolus 1,000 mL (0 mLs Intravenous Stopped 02/27/17 1318)     CRITICAL CARE Performed by: Marco Manifold, MD Total critical care time: 35 minutes Critical care time was exclusive of separately billable procedures and treating other patients. Critical care was necessary to treat or prevent imminent or life-threatening deterioration. Critical care was time spent personally by me on the following activities: development of treatment plan with patient and/or surrogate as well as nursing, discussions with consultants, evaluation of patient's response to treatment, examination  of patient, obtaining history from patient or surrogate, ordering and performing treatments and interventions, ordering and review of laboratory studies, ordering and review of radiographic studies, pulse oximetry and re-evaluation of patient's condition.   Labs (all labs ordered are listed, but only abnormal results are displayed) Labs Reviewed  COMPREHENSIVE METABOLIC PANEL - Abnormal; Notable  for the following:       Result Value   Glucose, Bld 103 (*)    Calcium 8.1 (*)    Total Protein 5.9 (*)    Albumin 2.1 (*)    Total Bilirubin 0.2 (*)    All other components within normal limits  CBC - Abnormal; Notable for the following:    RBC 3.87 (*)    Hemoglobin 10.1 (*)    HCT 33.9 (*)    MCH 25.8 (*)    MCHC 29.5 (*)    All other components within normal limits  URINALYSIS, ROUTINE W REFLEX MICROSCOPIC - Abnormal; Notable for the following:    Specific Gravity, Urine >1.046 (*)    Ketones, ur 5 (*)    All other components within normal limits  PROTIME-INR - Abnormal; Notable for the following:    Prothrombin Time 15.4 (*)    All other components within normal limits  I-STAT CHEM 8, ED - Abnormal; Notable for the following:    Sodium 146 (*)    Calcium, Ion 1.14 (*)    Hemoglobin 12.6 (*)    HCT 37.0 (*)    All other components within normal limits  I-STAT CG4 LACTIC ACID, ED - Abnormal; Notable for the following:    Lactic Acid, Venous 3.46 (*)    All other components within normal limits  I-STAT ARTERIAL BLOOD GAS, ED - Abnormal; Notable for the following:    pH, Arterial 7.484 (*)    pCO2 arterial 49.6 (*)    pO2, Arterial 378.0 (*)    Bicarbonate 37.3 (*)    Acid-Base Excess 12.0 (*)    All other components within normal limits  URINE CULTURE  CDS SEROLOGY  ETHANOL  PREPARE FRESH FROZEN PLASMA  SAMPLE TO BLOOD BANK  BLOOD PRODUCT ORDER (VERBAL) VERIFICATION    EKG  EKG Interpretation None       Radiology Ct Head Wo Contrast  Result Date: 02/27/2017 CLINICAL DATA:  Fall. EXAM: CT HEAD WITHOUT CONTRAST CT CERVICAL SPINE WITHOUT CONTRAST TECHNIQUE: Multidetector CT imaging of the head and cervical spine was performed following the standard protocol without intravenous contrast. Multiplanar CT image reconstructions of the cervical spine were also generated. COMPARISON:  CT head 12/31/2016 FINDINGS: CT HEAD FINDINGS Brain: Generalized atrophy. Negative  for hydrocephalus. Interval development of small bilateral subdural hygroma. No acute hemorrhage. No shift of the midline structures. Negative for acute infarct or mass. Vascular: Negative for hyperdense vessel Skull: Negative. Bilateral external auditory canal occlusion due to debris. Sinuses/Orbits: Negative Other: None CT CERVICAL SPINE FINDINGS Alignment: Mild retrolisthesis C3-4, C4-5, C5-6 Skull base and vertebrae: Negative for fracture or mass. Soft tissues and spinal canal: Negative for mass or adenopathy in the neck. Disc levels: Disc degeneration and mild spurring throughout the cervical spine without significant spinal stenosis. Facet joints intact Upper chest: Large left pleural effusion with collapse of much of the left upper lobe. Right lung apex clear. Other: None IMPRESSION: Small bilateral subdural hygromas have developed since the prior CT. This may be due to recent trauma. No acute hemorrhage or infarction. Negative for cervical spine fracture Large left pleural effusion with collapse of most  of the left lung. Electronically Signed   By: Franchot Gallo M.D.   On: 02/27/2017 11:37   Ct Chest W Contrast  Result Date: 02/27/2017 CLINICAL DATA:  Level 1 fall.  History lung cancer. EXAM: CT CHEST, ABDOMEN, AND PELVIS WITH CONTRAST TECHNIQUE: Multidetector CT imaging of the chest, abdomen and pelvis was performed following the standard protocol during bolus administration of intravenous contrast. CONTRAST:  138mL ISOVUE-300 IOPAMIDOL (ISOVUE-300) INJECTION 61% COMPARISON:  CT chest 02/11/2017 FINDINGS: CT CHEST FINDINGS Cardiovascular: Heart size within normal limits. Mild atherosclerotic calcification aortic arch. No aneurysm or dissection. Small pericardial effusion. Encasement of the left pulmonary artery by tumor which is markedly narrowed similar to the prior CT. Mediastinum/Nodes: Large left upper lobe mass lesion. No definite pathologic lymph nodes in the mediastinum Lungs/Pleura: Left upper  lobe mass lesion 5.7 x 6.3 cm with collapse of most the left upper lobe and encasement of the left lower pulmonary artery. The mass abuts the aortic arch. There is a large left effusion which is low density. Compressive atelectasis in the left lower lobe. Significant progression of pleural effusions since the prior CT. Right lung is clear. Musculoskeletal: Negative for fracture. Negative for skeletal metastatic disease. CT ABDOMEN PELVIS FINDINGS Hepatobiliary: Ill-defined mass in the left lobe liver 2.7 cm compatible with metastatic disease. No change from the prior CT. No other liver lesions. Gallbladder and bile ducts normal. Pancreas: Negative Spleen: Negative Adrenals/Urinary Tract: 2 cm right renal cyst. No renal mass or obstruction. Urinary bladder normal. Stomach/Bowel: Negative for bowel mass or obstruction. Normal appendix. Vascular/Lymphatic: Mild atherosclerotic disease. Reproductive: Moderate prostate enlargement. Other: Small amount of free fluid in the pelvis Musculoskeletal: Lumbar degenerative change. No fracture or skeletal metastatic disease. IMPRESSION: No evidence of acute trauma in the chest abdomen or pelvis Left upper lobe mass lesion compatible with known carcinoma. Large left pleural effusion has progressed since the prior CT with collapse of the left lower lobe and left upper lobe. There is encasement of the left lower pulmonary artery due to tumor 2.7 cm mass left lobe liver compatible with metastatic disease unchanged from the prior study. Electronically Signed   By: Franchot Gallo M.D.   On: 02/27/2017 11:50   Ct Cervical Spine Wo Contrast  Result Date: 02/27/2017 CLINICAL DATA:  Fall. EXAM: CT HEAD WITHOUT CONTRAST CT CERVICAL SPINE WITHOUT CONTRAST TECHNIQUE: Multidetector CT imaging of the head and cervical spine was performed following the standard protocol without intravenous contrast. Multiplanar CT image reconstructions of the cervical spine were also generated. COMPARISON:   CT head 12/31/2016 FINDINGS: CT HEAD FINDINGS Brain: Generalized atrophy. Negative for hydrocephalus. Interval development of small bilateral subdural hygroma. No acute hemorrhage. No shift of the midline structures. Negative for acute infarct or mass. Vascular: Negative for hyperdense vessel Skull: Negative. Bilateral external auditory canal occlusion due to debris. Sinuses/Orbits: Negative Other: None CT CERVICAL SPINE FINDINGS Alignment: Mild retrolisthesis C3-4, C4-5, C5-6 Skull base and vertebrae: Negative for fracture or mass. Soft tissues and spinal canal: Negative for mass or adenopathy in the neck. Disc levels: Disc degeneration and mild spurring throughout the cervical spine without significant spinal stenosis. Facet joints intact Upper chest: Large left pleural effusion with collapse of much of the left upper lobe. Right lung apex clear. Other: None IMPRESSION: Small bilateral subdural hygromas have developed since the prior CT. This may be due to recent trauma. No acute hemorrhage or infarction. Negative for cervical spine fracture Large left pleural effusion with collapse of most of the left  lung. Electronically Signed   By: Franchot Gallo M.D.   On: 02/27/2017 11:37   Ct Abdomen Pelvis W Contrast  Result Date: 02/27/2017 CLINICAL DATA:  Level 1 fall.  History lung cancer. EXAM: CT CHEST, ABDOMEN, AND PELVIS WITH CONTRAST TECHNIQUE: Multidetector CT imaging of the chest, abdomen and pelvis was performed following the standard protocol during bolus administration of intravenous contrast. CONTRAST:  124mL ISOVUE-300 IOPAMIDOL (ISOVUE-300) INJECTION 61% COMPARISON:  CT chest 02/11/2017 FINDINGS: CT CHEST FINDINGS Cardiovascular: Heart size within normal limits. Mild atherosclerotic calcification aortic arch. No aneurysm or dissection. Small pericardial effusion. Encasement of the left pulmonary artery by tumor which is markedly narrowed similar to the prior CT. Mediastinum/Nodes: Large left upper lobe  mass lesion. No definite pathologic lymph nodes in the mediastinum Lungs/Pleura: Left upper lobe mass lesion 5.7 x 6.3 cm with collapse of most the left upper lobe and encasement of the left lower pulmonary artery. The mass abuts the aortic arch. There is a large left effusion which is low density. Compressive atelectasis in the left lower lobe. Significant progression of pleural effusions since the prior CT. Right lung is clear. Musculoskeletal: Negative for fracture. Negative for skeletal metastatic disease. CT ABDOMEN PELVIS FINDINGS Hepatobiliary: Ill-defined mass in the left lobe liver 2.7 cm compatible with metastatic disease. No change from the prior CT. No other liver lesions. Gallbladder and bile ducts normal. Pancreas: Negative Spleen: Negative Adrenals/Urinary Tract: 2 cm right renal cyst. No renal mass or obstruction. Urinary bladder normal. Stomach/Bowel: Negative for bowel mass or obstruction. Normal appendix. Vascular/Lymphatic: Mild atherosclerotic disease. Reproductive: Moderate prostate enlargement. Other: Small amount of free fluid in the pelvis Musculoskeletal: Lumbar degenerative change. No fracture or skeletal metastatic disease. IMPRESSION: No evidence of acute trauma in the chest abdomen or pelvis Left upper lobe mass lesion compatible with known carcinoma. Large left pleural effusion has progressed since the prior CT with collapse of the left lower lobe and left upper lobe. There is encasement of the left lower pulmonary artery due to tumor 2.7 cm mass left lobe liver compatible with metastatic disease unchanged from the prior study. Electronically Signed   By: Franchot Gallo M.D.   On: 02/27/2017 11:50   Dg Chest Port 1 View  Result Date: 02/27/2017 CLINICAL DATA:  Level 1 fall down steps, left lung cancer EXAM: PORTABLE CHEST 1 VIEW COMPARISON:  02/11/2017 chest CT and chest radiograph FINDINGS: Stable cardiomediastinal silhouette with normal heart size. No pneumothorax. No right  pleural effusion. New small to moderate left pleural effusion. Worsened volume loss in the left hemithorax with increased patchy opacity in the left lung surrounding the known large central left upper lung mass. No displaced fractures. IMPRESSION: 1. No pneumothorax . 2. New small to moderate left pleural effusion. 3. Worsened volume loss in the left lung with increased patchy opacity in the left lung surrounding the known large central left upper lung malignancy, which could represent atelectasis, aspiration or contusion. Electronically Signed   By: Ilona Sorrel M.D.   On: 02/27/2017 12:04    Procedures Procedures (including critical care time)  Medications Ordered in ED Medications  ketamine 100 mg in normal saline 10 mL (10mg /mL) syringe (not administered)  levofloxacin (LEVAQUIN) tablet 500 mg (not administered)  iopamidol (ISOVUE-300) 61 % injection 100 mL (100 mLs Intravenous Contrast Given 02/27/17 1110)  sodium chloride 0.9 % bolus 1,000 mL (0 mLs Intravenous Stopped 02/27/17 1318)     Initial Impression / Assessment and Plan / ED Course  I have  reviewed the triage vital signs and the nursing notes.  Pertinent labs & imaging results that were available during my care of the patient were reviewed by me and considered in my medical decision making (see chart for details).     66yM with fall. Unwitnessed. Apparently usual state of health just prior and altered when found. Made trauma alert because GCS decreasing to 8 in route and was briefly bagged. I'm not convinced this is a traumatic problem though. Per brief review of notes, he has a hx of CA and wishes for palliative care per oncology note this week.   Pt had to be given a small dose of versed initially because of combativeness. Mental status seems to slowly be improving. Will proceed with trauma w/u though. No breath sounds on L on exam and L lung white out on PCXR. Known malignancy and effusion that side on last imaging.   Ongoing  discussion with caretakers and sister. He now seems back to his baseline. His sister would very much like him discharged home. She is not interested in pursuing placement at this time.   Final Clinical Impressions(s) / ED Diagnoses   Final diagnoses:  Fall (on) (from) other stairs and steps, initial encounter  Failure to thrive in adult  Pleural effusion    New Prescriptions New Prescriptions   LEVOFLOXACIN (LEVAQUIN) 500 MG TABLET    Take 1 tablet (500 mg total) by mouth daily.    I personally preformed the services scribed in my presence. The recorded information has been reviewed is accurate. Marco Manifold, MD.    Marco Manifold, MD 03/10/17 [phone]

## 2017-02-27 NOTE — ED Triage Notes (Signed)
Pt arrived by [facility] after care giver reports hearing a loud noise and then found pt at the bottom of 7 steps. Pt arrives to ED with no verbal response and pulling at ecg leads, not following commands. EMS was assisting with ventilations on arrival. Pt was placed on NRB at 15L pt became alert to verbal stimulus with incomprehensible speech and not following commands. Pt was given 2mg  versed IV to relax for portable xrays and  CT scan.

## 2017-02-27 NOTE — ED Notes (Signed)
Pt's caregiver verbalized understanding of d/c instructions and has no further questions. Pt to stay in current living situation and hospice is in charge of care. VSS.

## 2017-02-28 LAB — URINE CULTURE

## 2017-03-01 ENCOUNTER — Ambulatory Visit: Payer: Medicare Other

## 2017-03-01 ENCOUNTER — Other Ambulatory Visit: Payer: Medicare Other

## 2017-03-01 ENCOUNTER — Ambulatory Visit: Payer: Medicare Other | Admitting: Nurse Practitioner

## 2017-03-06 ENCOUNTER — Other Ambulatory Visit: Payer: Self-pay | Admitting: Neurology

## 2017-03-08 ENCOUNTER — Other Ambulatory Visit: Payer: Self-pay | Admitting: Neurology

## 2017-03-15 ENCOUNTER — Ambulatory Visit: Payer: Medicare Other

## 2017-03-15 ENCOUNTER — Ambulatory Visit: Payer: Medicare Other | Admitting: Internal Medicine

## 2017-03-15 ENCOUNTER — Other Ambulatory Visit: Payer: Medicare Other

## 2017-03-16 NOTE — Anesthesia Postprocedure Evaluation (Signed)
Anesthesia Post Note  Patient: Marco [name]  Procedure(s) Performed: Procedure(s) (LRB): M R BRAIN WITH AND WITHOUT CONTRAST (N/A)     Anesthesia Post Evaluation  Last Vitals:  Vitals:   01/17/17 1358 01/17/17 1413  BP: 135/81 114/88  Pulse: 72   Resp: 16   Temp:      Last Pain:  Vitals:   01/17/17 1357  TempSrc:   PainSc: 0-No pain                 Carmel Waddington EDWARD

## 2017-03-16 NOTE — Addendum Note (Signed)
Addendum  created 03/16/17 1252 by Lyndle Herrlich, MD   Sign clinical note

## 2017-03-21 ENCOUNTER — Telehealth: Payer: Self-pay | Admitting: *Deleted

## 2017-03-21 ENCOUNTER — Other Ambulatory Visit: Payer: Self-pay | Admitting: Medical Oncology

## 2017-03-21 NOTE — Telephone Encounter (Signed)
Gave verbal order to re-instate hospice care. Desk nurse notified.

## 2017-03-23 ENCOUNTER — Encounter: Payer: Self-pay | Admitting: *Deleted

## 2017-03-28 ENCOUNTER — Emergency Department (HOSPITAL_COMMUNITY)

## 2017-03-28 ENCOUNTER — Inpatient Hospital Stay (HOSPITAL_COMMUNITY)
Admission: EM | Admit: 2017-03-28 | Discharge: 2017-03-30 | DRG: 871 | Disposition: A | Discharge status: Hospice | Attending: Family Medicine | Admitting: Family Medicine

## 2017-03-28 ENCOUNTER — Telehealth: Payer: Self-pay | Admitting: Neurology

## 2017-03-28 ENCOUNTER — Encounter (HOSPITAL_COMMUNITY): Payer: Self-pay | Admitting: General Practice

## 2017-03-28 DIAGNOSIS — Z923 Personal history of irradiation: Secondary | ICD-10-CM | POA: Diagnosis not present

## 2017-03-28 DIAGNOSIS — J189 Pneumonia, unspecified organism: Secondary | ICD-10-CM

## 2017-03-28 DIAGNOSIS — R401 Stupor: Secondary | ICD-10-CM | POA: Diagnosis not present

## 2017-03-28 DIAGNOSIS — Z515 Encounter for palliative care: Secondary | ICD-10-CM | POA: Diagnosis present

## 2017-03-28 DIAGNOSIS — G40909 Epilepsy, unspecified, not intractable, without status epilepticus: Secondary | ICD-10-CM | POA: Diagnosis present

## 2017-03-28 DIAGNOSIS — E870 Hyperosmolality and hypernatremia: Secondary | ICD-10-CM | POA: Diagnosis present

## 2017-03-28 DIAGNOSIS — R64 Cachexia: Secondary | ICD-10-CM | POA: Diagnosis present

## 2017-03-28 DIAGNOSIS — A419 Sepsis, unspecified organism: Secondary | ICD-10-CM | POA: Diagnosis not present

## 2017-03-28 DIAGNOSIS — Z8249 Family history of ischemic heart disease and other diseases of the circulatory system: Secondary | ICD-10-CM | POA: Diagnosis not present

## 2017-03-28 DIAGNOSIS — E860 Dehydration: Secondary | ICD-10-CM | POA: Diagnosis present

## 2017-03-28 DIAGNOSIS — N179 Acute kidney failure, unspecified: Secondary | ICD-10-CM | POA: Diagnosis present

## 2017-03-28 DIAGNOSIS — R400 Somnolence: Secondary | ICD-10-CM

## 2017-03-28 DIAGNOSIS — F0390 Unspecified dementia without behavioral disturbance: Secondary | ICD-10-CM | POA: Diagnosis present

## 2017-03-28 DIAGNOSIS — R4182 Altered mental status, unspecified: Secondary | ICD-10-CM | POA: Diagnosis present

## 2017-03-28 DIAGNOSIS — Z66 Do not resuscitate: Secondary | ICD-10-CM | POA: Diagnosis present

## 2017-03-28 DIAGNOSIS — C3492 Malignant neoplasm of unspecified part of left bronchus or lung: Secondary | ICD-10-CM | POA: Diagnosis present

## 2017-03-28 DIAGNOSIS — J181 Lobar pneumonia, unspecified organism: Secondary | ICD-10-CM

## 2017-03-28 DIAGNOSIS — E43 Unspecified severe protein-calorie malnutrition: Secondary | ICD-10-CM | POA: Diagnosis present

## 2017-03-28 DIAGNOSIS — I10 Essential (primary) hypertension: Secondary | ICD-10-CM | POA: Diagnosis present

## 2017-03-28 DIAGNOSIS — I959 Hypotension, unspecified: Secondary | ICD-10-CM | POA: Diagnosis present

## 2017-03-28 DIAGNOSIS — C787 Secondary malignant neoplasm of liver and intrahepatic bile duct: Secondary | ICD-10-CM | POA: Diagnosis present

## 2017-03-28 DIAGNOSIS — F72 Severe intellectual disabilities: Secondary | ICD-10-CM | POA: Diagnosis present

## 2017-03-28 DIAGNOSIS — Z87891 Personal history of nicotine dependence: Secondary | ICD-10-CM | POA: Diagnosis not present

## 2017-03-28 DIAGNOSIS — Z681 Body mass index (BMI) 19 or less, adult: Secondary | ICD-10-CM | POA: Diagnosis not present

## 2017-03-28 HISTORY — DX: Pneumonia, unspecified organism: J18.9

## 2017-03-28 HISTORY — DX: Schizophrenia, unspecified: F20.9

## 2017-03-28 LAB — CBC WITH DIFFERENTIAL/PLATELET
BASOS PCT: 0 %
Basophils Absolute: 0 10*3/uL (ref 0.0–0.1)
EOS ABS: 0 10*3/uL (ref 0.0–0.7)
EOS PCT: 0 %
HCT: 34.4 % — ABNORMAL LOW (ref 39.0–52.0)
Hemoglobin: 10.1 g/dL — ABNORMAL LOW (ref 13.0–17.0)
Lymphocytes Relative: 3 %
Lymphs Abs: 0.4 10*3/uL — ABNORMAL LOW (ref 0.7–4.0)
MCH: 24.8 pg — ABNORMAL LOW (ref 26.0–34.0)
MCHC: 29.4 g/dL — AB (ref 30.0–36.0)
MCV: 84.3 fL (ref 78.0–100.0)
MONO ABS: 1.3 10*3/uL — AB (ref 0.1–1.0)
MONOS PCT: 11 %
Neutro Abs: 10.1 10*3/uL — ABNORMAL HIGH (ref 1.7–7.7)
Neutrophils Relative %: 86 %
Platelets: 188 10*3/uL (ref 150–400)
RBC: 4.08 MIL/uL — ABNORMAL LOW (ref 4.22–5.81)
RDW: 15 % (ref 11.5–15.5)
WBC: 11.8 10*3/uL — ABNORMAL HIGH (ref 4.0–10.5)

## 2017-03-28 LAB — URINALYSIS, ROUTINE W REFLEX MICROSCOPIC
Bilirubin Urine: NEGATIVE
Glucose, UA: NEGATIVE mg/dL
Hgb urine dipstick: NEGATIVE
KETONES UR: NEGATIVE mg/dL
LEUKOCYTES UA: NEGATIVE
NITRITE: NEGATIVE
PH: 6 (ref 5.0–8.0)
PROTEIN: NEGATIVE mg/dL
Specific Gravity, Urine: 1.02 (ref 1.005–1.030)

## 2017-03-28 LAB — COMPREHENSIVE METABOLIC PANEL
ALBUMIN: 2.1 g/dL — AB (ref 3.5–5.0)
ALT: 23 U/L (ref 17–63)
ANION GAP: 8 (ref 5–15)
AST: 38 U/L (ref 15–41)
Alkaline Phosphatase: 53 U/L (ref 38–126)
BUN: 9 mg/dL (ref 6–20)
CO2: 31 mmol/L (ref 22–32)
Calcium: 8 mg/dL — ABNORMAL LOW (ref 8.9–10.3)
Chloride: 107 mmol/L (ref 101–111)
Creatinine, Ser: 1.05 mg/dL (ref 0.61–1.24)
GFR calc Af Amer: >60 mL/min (ref >60)
GFR calc non Af Amer: >60 mL/min (ref >60)
GLUCOSE: 103 mg/dL — AB (ref 65–99)
POTASSIUM: 3.5 mmol/L (ref 3.5–5.1)
SODIUM: 146 mmol/L — AB (ref 135–145)
TOTAL PROTEIN: 6.3 g/dL — AB (ref 6.5–8.1)
Total Bilirubin: 0.6 mg/dL (ref 0.3–1.2)

## 2017-03-28 LAB — CBG MONITORING, ED: GLUCOSE-CAPILLARY: 94 mg/dL (ref 65–99)

## 2017-03-28 LAB — I-STAT TROPONIN, ED: TROPONIN I, POC: 0 ng/mL (ref 0.00–0.08)

## 2017-03-28 LAB — I-STAT CG4 LACTIC ACID, ED: Lactic Acid, Venous: 1.71 mmol/L (ref 0.5–1.9)

## 2017-03-28 MED ORDER — PIPERACILLIN-TAZOBACTAM 3.375 G IVPB 30 MIN
3.3750 g | Freq: Once | INTRAVENOUS | Status: AC
  Administered 2017-03-28: 3.375 g via INTRAVENOUS
  Filled 2017-03-28: qty 50

## 2017-03-28 MED ORDER — DIAZEPAM 10 MG RE GEL: 5.0000 mg | Freq: Once | RECTAL | Status: DC | PRN

## 2017-03-28 MED ORDER — SODIUM CHLORIDE 0.9 % IV SOLN
200.0000 mg | Freq: Two times a day (BID) | INTRAVENOUS | Status: DC
  Administered 2017-03-28: 200 mg via INTRAVENOUS
  Filled 2017-03-28 (×2): qty 20

## 2017-03-28 MED ORDER — SODIUM CHLORIDE 0.9 % IV SOLN
750.0000 mg | Freq: Two times a day (BID) | INTRAVENOUS | Status: DC
  Administered 2017-03-29: 750 mg via INTRAVENOUS
  Filled 2017-03-28: qty 7.5

## 2017-03-28 MED ORDER — IOPAMIDOL (ISOVUE-300) INJECTION 61%
INTRAVENOUS | Status: AC
  Administered 2017-03-28: 75 mL
  Filled 2017-03-28: qty 75

## 2017-03-28 MED ORDER — SODIUM CHLORIDE 0.9 % IV BOLUS (SEPSIS)
1000.0000 mL | Freq: Once | INTRAVENOUS | Status: AC
  Administered 2017-03-28: 1000 mL via INTRAVENOUS

## 2017-03-28 MED ORDER — PIPERACILLIN-TAZOBACTAM 3.375 G IVPB
3.3750 g | Freq: Three times a day (TID) | INTRAVENOUS | Status: DC
  Administered 2017-03-28 – 2017-03-30 (×6): 3.375 g via INTRAVENOUS
  Filled 2017-03-28 (×7): qty 50

## 2017-03-28 MED ORDER — ENOXAPARIN SODIUM 40 MG/0.4ML ~~LOC~~ SOLN
40.0000 mg | SUBCUTANEOUS | Status: DC
  Administered 2017-03-28 – 2017-03-29 (×2): 40 mg via SUBCUTANEOUS
  Filled 2017-03-28 (×3): qty 0.4

## 2017-03-28 MED ORDER — SODIUM CHLORIDE 0.9 % IV SOLN
Freq: Once | INTRAVENOUS | Status: AC
  Administered 2017-03-28: 19:00:00 via INTRAVENOUS

## 2017-03-28 MED ORDER — SODIUM CHLORIDE 0.9 % IV SOLN
500.0000 mg | Freq: Two times a day (BID) | INTRAVENOUS | Status: DC
  Administered 2017-03-29 – 2017-03-30 (×3): 500 mg via INTRAVENOUS
  Filled 2017-03-28 (×4): qty 500

## 2017-03-28 MED ORDER — VANCOMYCIN HCL IN DEXTROSE 1-5 GM/200ML-% IV SOLN
1000.0000 mg | Freq: Once | INTRAVENOUS | Status: AC
  Administered 2017-03-28: 1000 mg via INTRAVENOUS
  Filled 2017-03-28: qty 200

## 2017-03-28 MED ORDER — VALPROATE SODIUM 500 MG/5ML IV SOLN
375.0000 mg | Freq: Four times a day (QID) | INTRAVENOUS | Status: DC
  Administered 2017-03-28 – 2017-03-29 (×3): 375 mg via INTRAVENOUS
  Filled 2017-03-28 (×5): qty 3.75

## 2017-03-28 NOTE — H&P (Signed)
[facility] Admission History and Physical Service Pager: [phone]  Patient name: Marco [name] Medical record number: [id number] Date of birth: 03-22-1951 Age: 66 y.o. Gender: male  Primary Care Provider: Leamon Arnt, MD Consultants: none Code Status: DNR/DNI  Chief Complaint: altered mental status  Assessment and Plan: Marco [name] is a 66 y.o. male presenting with altered mental status for last 24 hours. PMH is significant for intellectual disability, squamous cell lung cancer, seizures, hypertension, protein-calorie malnutrition. From chart review the patient is demented and has intellectual disability at baseline. Patient with some level of cognitive impairment at baseline, but when members of the family arrived this am they felt like he was significantly different than usual and brought him to the ed. No family at bedside during our exam so most history gleaned from ed physicians.  Altered Mental Status Patient with baseline altered mental status and his exact baseline is unclear at this time. Per reports he is significantly altered. Altered mental status is most likely due to lung infection, given the results of his ct scan, cxr, and obvious nidus for infection from lung mass. He has a slightly increased wbc, tachycardic, and hypotensive. Other sources of infection could be meningeal, urine, or blood. Patient with no signs of meningeal inflammation although difficult to tell due to ams. UA (-) for any sign of infection. Blood cultures pending. Other possible causes for ams could be from stroke, trauma, seizure, medications, or toxins. Per history and presentation all of these seem unlikely aside from stroke and trauma which is unlikely given the ct scan of the head. - admit to inpatient, floor bed, Attending: Dr. Andria Frames - vitals per floor - o2 sats with vitals - Patient is dnr/dni - continue vanc/zosyn, pharmacy to dose - ns @ 125mL/hr - continue  home keppra, vimpat, diazapam, valproic acid for seizure ppx - npo for now - lovenox for dvt ppx - trend lactic acids - f/u blood cultures - will discuss comfort measures with family when they arrive - daily cbc, bmp - Will consult palliative in AM  H/O seizures Is possible cause for altered mental status (with a resulting post ictal state). Due to long time period patient has been altered, and no witnessed seizure this is less likely than infection. Will continue vimpat, keppra, diazapam, valproic acid for seizure ppx in iv form - monitor s/s of seizure - no neuro consult indicated at this time  Hypotension Patient with hypotension down to 40N systolic on presentation. Unclear if hypotension directly related to presumed sepsis or is a baseline finding. Receiving mivf, so expect this resolve. Suspect that antibiotics will also help resolve as treating the source of patinet's presumed sepsis. - continue ns @ 167mL/hr - continue vanc/zosyn - no use of pressors due to code status  Hypernatremia Only very slightly elevated at 146. Will monitor with daily bmp. Giving NS. Patient with good kidney function so will likely correct itself. - AM BMP  Squamous Cell Lung Cancer Patient with apparently non-resectable tumor and is reason for patient already being on hospice. Per report the patient has stopped all treatment measures. Maintaining O2 sats in mid 90s on RA. Will continue to monitor respiratory status while here. - Continue to monitor respiratory status  Disposition plan While patient was made dnr/dni and is currently on hospice the family has thus far not considered comfort measures until they are able to come visit patient. Will further discuss code status and comfort measures when family arrives. -  Consult palliative in AM  FEN/GI: none Prophylaxis: lovenox  Disposition: admit to FPTS  History of Present Illness:  Marco [name] is a 66 y.o. male presenting with altered mental  status for last 24 hours. From chart review the patient is demented and has intellectual disability at baseline. Patient with some level of cognitive impairment at baseline, but when members of the family arrived this am they felt like he was significantly different than usual and brought him to the ed. No family at bedside during our exam so most history gleaned from ed physicians. Patient is a hospice patient but was apparently full code prior to today. It is unclear exactly what patient's baseline mental status is at this point. ED physicians talked to patient's sister, who makes his medical decisions, and the patient was changed to dnr/dni. They want fluid resuscitation and antibiotics but not necessarily escalation of care. Comfort care was mentioned to the medical decision maker and the family is not yet ready for that step.  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 Small fluid collection on right. CT scan of the chest showed enlargement of the lung mass, centered in central LUL. Patient with HR 115, bp 106/76 on admission. Initial cbc showed wbc 11.8, hgb 10.1, plt 188. Bmp na 146, k 3.5, glu 103, ca 8.0, alb 2.1, tprot 6.3. Trop 0.0, la 1.71. UA normal, ekg normal. Chest xray: opacification of left hemidiaphragm, thought to have small pleural effusion 2/2 lung mass. Initially very difficulty to arouse despite sternal rub. Eventually opened eyes and would make unintelligible noises. Would not follow commands.  Review Of Systems: Per HPI with the following additions:   Review of Systems  Unable to perform ROS: Mental status change    Patient Active Problem List   Diagnosis Date Noted  . Seizure disorder ([place])   . Seizure ([place]) 01/14/2017  . Recurrent seizures ([place]) 01/01/2017  . Seizures ([place]) 01/01/2017  . Dysphagia 01/01/2017  . Encounter for antineoplastic immunotherapy 12/21/2016  . Encounter for antineoplastic chemotherapy 10/10/2016  .  Hypertension 09/14/2016  . AKI (acute kidney injury) ([place]) 04/23/2016  . Acute on chronic respiratory failure with hypoxemia ([place]) 04/23/2016  . Protein-calorie malnutrition, severe ([place]) 01/28/2016  . Altered mental status   . Post-ictal confusion   . Acute encephalopathy 01/25/2016  . Squamous cell carcinoma of lung ([place]) 01/25/2016  . Seizure disorder ([place]) 01/25/2016  . Seizure ([place]) 08/22/2015  . Malnutrition of moderate degree 07/02/2015  . Sepsis ([place]) 07/01/2015  . UTI (urinary tract infection) 07/01/2015  . ARF (acute renal failure) ([place]) 07/01/2015  . Dementia without behavioral disturbance 07/01/2015  . Leukocytosis 07/01/2015  . Hypokalemia 07/01/2015  . Hypernatremia 07/01/2015  . Convulsions/seizures ([place]) 05/20/2015  . Acute respiratory failure with hypoxia ([place])   . Goals of care, counseling/discussion 04/05/2015  . Squamous cell lung cancer ([place]) 09/10/2013  . Severe intellectual disability with intelligence quotient 20 to 34 10/29/2012    Past Medical History: Past Medical History:  Diagnosis Date  . DEMENTIA   . Depression   . Developmental disability    developmentaly delayed  . Encounter for antineoplastic chemotherapy 10/10/2016  . Encounter for antineoplastic immunotherapy 04/05/2015  . Encounter for antineoplastic immunotherapy 12/21/2016  . GERD (gastroesophageal reflux disease)   . Hx of radiation therapy 11/07/13-12/24/13   lung,60Gy/84fx  . Hypertension    no medications, no documented history per caregiver at preadmission  . Lung cancer ([place])   .  Mental disorder    sczizophrenia;moderate retardation  . Mental retardation   . Seizure disorder ([place])   . Seizures ([place])     Past Surgical History: Past Surgical History:  Procedure Laterality Date  . CATARACT EXTRACTION W/PHACO  07/12/2011   Procedure: CATARACT EXTRACTION PHACO AND INTRAOCULAR LENS PLACEMENT (IOC);  Surgeon: Adonis Brook;  Location: [facility];  Service: Ophthalmology;  Laterality: Left;   . EYE SURGERY     L eye  . RADIOLOGY WITH ANESTHESIA N/A 01/17/2017   Procedure: Elvera Bicker BRAIN WITH AND WITHOUT CONTRAST;  Surgeon: Radiologist, Medication, MD;  Location: [facility];  Service: Radiology;  Laterality: N/A;  . VIDEO BRONCHOSCOPY Bilateral 10/01/2013   Procedure: VIDEO BRONCHOSCOPY WITHOUT FLUORO;  Surgeon: Collene Gobble, MD;  Location: [facility];  Service: Cardiopulmonary;  Laterality: Bilateral;    Social History: Social History  Substance Use Topics  . Smoking status: Former Smoker    Packs/day: 0.50    Years: 35.00    Types: Cigarettes    Quit date: 09/07/2013  . Smokeless tobacco: Never Used  . Alcohol use No   Additional social history:  Please also refer to relevant sections of EMR.  Family History: Family History  Problem Relation Age of Onset  . Hypertension Mother   . Hypertension Father     Allergies and Medications: No Known Allergies No current facility-administered medications on file prior to encounter.    Current Outpatient Prescriptions on File Prior to Encounter  Medication Sig Dispense Refill  . acetaminophen (TYLENOL) 500 MG tablet Take 500 mg by mouth every 4 (four) hours as needed for mild pain or headache.     . bisacodyl (DULCOLAX) 5 MG EC tablet Take 5 mg by mouth daily as needed for mild constipation.     . diazepam (DIASTAT ACUDIAL) 10 MG GEL Place 5 mg rectally once as needed for seizure.    . divalproex (DEPAKOTE ER) 250 MG 24 hr tablet Take 3 tablets (750 mg total) by mouth 2 (two) times daily. 180 tablet 5  . fluticasone (FLONASE) 50 MCG/ACT nasal spray Place 2 sprays into both nostrils daily.    Marland Kitchen HYDROcodone-homatropine (HYCODAN) 5-1.5 MG/5ML syrup Take 5 mLs by mouth every 6 (six) hours as needed for cough. 120 mL 0  . ketoconazole (NIZORAL) 2 % cream Apply 1 application topically 2 (two) times daily as needed for irritation. Pt applies to buttocks.     . lacosamide (VIMPAT) 200 MG TABS tablet Take 1 tablet (200 mg total) by mouth 2  (two) times daily. 60 tablet 5  . levETIRAcetam (KEPPRA XR) 500 MG 24 hr tablet TAKE THREE TABLETS BY MOUTH TWICE DAILY 180 tablet 0  . loperamide (IMODIUM A-D) 2 MG tablet Take 2 mg by mouth every 4 (four) hours as needed for diarrhea or loose stools.    Marland Kitchen LORazepam (ATIVAN) 1 MG tablet Take 2 tablets (2 mg total) by mouth at bedtime. 30 tablet 0  . omeprazole (PRILOSEC) 20 MG capsule Take 20 mg by mouth 2 (two) times daily.    . polyethylene glycol (MIRALAX / GLYCOLAX) packet Take 17 g by mouth daily as needed for mild constipation or moderate constipation.    . prochlorperazine (COMPAZINE) 10 MG tablet Take 10 mg by mouth every 6 (six) hours as needed for nausea or vomiting.    . risperiDONE (RISPERDAL) 1 MG tablet Take 1 tablet (1 mg total) by mouth at bedtime. 30 tablet 0  . traZODone (DESYREL) 50 MG tablet Take  25 mg by mouth at bedtime as needed for sleep.    Marland Kitchen levofloxacin (LEVAQUIN) 500 MG tablet Take 1 tablet (500 mg total) by mouth daily. (Patient not taking: Reported on 03/28/2017) 6 tablet 0    Objective: BP (!) 85/70   Pulse 91   Temp 98.9 F (37.2 C) (Rectal)   Resp 16   SpO2 97%  Exam: General: very hard to arouse with sternal rub, very thin, frail elderly male, non-verbal, GCS 10. Eyes: PERRL, would not move eyes for ocular exam ENTM: no signs of trauma bilateral external ears or nose, very poor dentition, only has a couple of teeth remaining Neck: no adenopathy Cardiovascular: tachycardic, no murmurs, rubs, gallops appreciated VASC: palpable radial pulse B, triphasic signals PT/DP bilaterally Respiratory: coarse rhonchi bilaterally, L>>R Gastrointestinal: soft, non-distended, non-tender MSK: would not participate in moving any limbs during exam, slight movement BUE Derm: very dry, warm skin Neuro: GCS 10, no neuro deficits appreciated but would not participate in exam Psych: non-verbal  Labs and Imaging: CBC BMET   Recent Labs Lab 03/28/17 1051  WBC 11.8*  HGB  10.1*  HCT 34.4*  PLT 188    Recent Labs Lab 03/28/17 1051  NA 146*  K 3.5  CL 107  CO2 31  BUN 9  CREATININE 1.05  GLUCOSE 103*  CALCIUM 8.0Guadalupe Dawn, MD 03/28/2017, 3:40 PM PGY-1, [facility] pager: [phone], text pages welcome  UPPER LEVEL ADDENDUM  I have read the above note and made revisions highlighted in orange.  Adin Hector, MD, MPH PGY-3 [facility] Pager [phone]

## 2017-03-28 NOTE — ED Notes (Signed)
[facility] hospital liason at bedside.

## 2017-03-28 NOTE — Telephone Encounter (Signed)
[facility] manager for the patient is calling to discuss the patient's health and is not sure if patient needs to make an appointment.

## 2017-03-28 NOTE — Telephone Encounter (Signed)
Pt to ED this morning d/t altered mental status. Per nurses' notes, pt "reportedly lethargic for days and family found him unresponsive this morning. Hx UTIs; febrile; unresponsive to anything but pain per EMS; lots of secretions (lung ca). [facility] patient. Full code. Crackles in all lungs. Pt appears to be actively dying."

## 2017-03-28 NOTE — ED Notes (Signed)
Learta Codding, guardian, [phone] office [phone] cell

## 2017-03-28 NOTE — ED Notes (Signed)
Unable to obtain 2nd set of BC. Per Dr. Oleta Mouse, start abx,

## 2017-03-28 NOTE — ED Notes (Signed)
Admitting at bedside 

## 2017-03-28 NOTE — Progress Notes (Signed)
Went to see Mr. Marco [name] with Dr. Ola Spurr. Patient was awake and more alert than when previously seen by FPTS. Patient's speech was difficult to understand but unsure if this is patient's baseline. Nurse noted that caretaker usually understands patient but he/she was unavailable at time of exam. Patient had course breath sounds bilaterally, worse on the left side. BP improving and is currently 104/80.  -continue NPO until patient becomes more oriented and is able to do swallow study  -will plan on discussing care with family when they arrive   Caroline More, [place], PGY-1 [facility] 03/28/2017 10:17 PM

## 2017-03-28 NOTE — Progress Notes (Signed)
Pharmacy Antibiotic Note Marco [name] is a 66 y.o. male admitted on 03/28/2017 with AMS and concern for sepsis.  Pharmacy has been consulted for Zosyn and vancomycin dosing.  Plan: 1. Vancomycin 500 IV every 12 hours.  Goal trough 15-20 mcg/mL. 2. Zosyn 3.375g IV q8h (4 hour infusion).  3. If antibiotics continued will plan early trough 2/2 to cachexia.   Weight: 110 lb 3.7 oz (50 kg)  Temp (24hrs), Avg:98.9 F (37.2 C), Min:98.9 F (37.2 C), Max:98.9 F (37.2 C)   Recent Labs Lab 03/28/17 1051 03/28/17 1133  WBC 11.8*  --   CREATININE 1.05  --   LATICACIDVEN  --  1.71    Estimated Creatinine Clearance: 47.6 mL/min (by C-G formula based on SCr of 1.05 mg/dL).    No Known Allergies  Antimicrobials this admission: 7/25 Zosyn >>  7/25 vancomycin >>   Microbiology results: px  Thank you for allowing pharmacy to be a part of this patient's care.  Vincenza Hews, PharmD, BCPS 03/28/2017, 5:32 PM

## 2017-03-28 NOTE — ED Triage Notes (Addendum)
Pt reportedly lethargic for days and family found him unresponsive this morning. Hx utis; febrile; unresponsive to anything but pain per EMS; lots of secretions (CA lung). [facility] patient. Full code. Crackles in all lungs. Pt appears to be actively dying; brother and rest of family said coming.

## 2017-03-28 NOTE — ED Notes (Signed)
Pt returned from CT °

## 2017-03-28 NOTE — Progress Notes (Addendum)
[facility] Liaison: RN visit  Spoke with caregiver [name] at bedside. [name] deferred all decision making to the patient's sister, Jorene Minors.  [name] called and left message for sister to call Donella Stade, RN at [phone].  He advised Ms. Owens Shark would get in touch with Dr. Oleta Mouse through Donella Stade to discuss goals of care and code status.   Crystal RN and Dr. Oleta Mouse aware.  Left message for Ollen Gross CSW covering for [place] CSW to return call.   We will continue to follow.  Farrel Gordon, [facility] Liaison  [phone]  All hospital liaisons are on [place].   UPDATE 3:25pm  Spoke with Dr. Oleta Mouse, who states she spoke with patient's sister and will be changing code status to partial code. Was not able to establish DNR at this time.

## 2017-03-28 NOTE — ED Provider Notes (Signed)
[facility] DEPT Provider Note   CSN: [id number] Arrival date & time: 03/28/17  1039     History   Chief Complaint Chief Complaint  Patient presents with  . Altered Mental Status    HPI Marco [name] is a 66 y.o. male.  HPI Level 5 caveat due to Altered mental status and cognitive impairment. 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 History is provided by EMS he states that family called them today due to 24 hours of altered mental status and lethargy. On their arrival he was tachycardic and hypotensive, receiving 250 mL of IV fluids..  Past Medical History:  Diagnosis Date  . DEMENTIA   . Depression   . Developmental disability    developmentaly delayed  . Encounter for antineoplastic chemotherapy 10/10/2016  . Encounter for antineoplastic immunotherapy 04/05/2015  . Encounter for antineoplastic immunotherapy 12/21/2016  . GERD (gastroesophageal reflux disease)   . Hx of radiation therapy 11/07/13-12/24/13   lung,60Gy/57f  . Hypertension    no medications, no documented history per caregiver at preadmission  . Lung cancer ([facility]   . Mental disorder    sczizophrenia;moderate retardation  . Mental retardation   . Seizure disorder ([facility]   . Seizures ([facility]     Patient Active Problem List   Diagnosis Date Noted  . Seizure disorder ([facility]   . Seizure ([facility] 01/14/2017  . Recurrent seizures ([facility] 01/01/2017  . Seizures (HF[place] 01/01/2017  . Dysphagia 01/01/2017  . Encounter for antineoplastic immunotherapy 12/21/2016  . Encounter for antineoplastic chemotherapy 10/10/2016  . Hypertension 09/14/2016  . AKI (acute kidney injury) ([facility] 04/23/2016  . Acute on chronic respiratory failure with hypoxemia ([facility] 04/23/2016  . Protein-calorie malnutrition, severe ([place] 01/28/2016  . Altered mental status   . Post-ictal confusion   . Acute encephalopathy 01/25/2016  . Squamous cell  carcinoma of lung ([facility] 01/25/2016  . Seizure disorder ([facility] 01/25/2016  . Seizure ([place] 08/22/2015  . Malnutrition of moderate degree 07/02/2015  . Sepsis ([facility] 07/01/2015  . UTI (urinary tract infection) 07/01/2015  . ARF (acute renal failure) ([place] 07/01/2015  . Dementia without behavioral disturbance 07/01/2015  . Leukocytosis 07/01/2015  . Hypokalemia 07/01/2015  . Hypernatremia 07/01/2015  . Convulsions/seizures (HC[facility] [place] 05/20/2015  . Acute respiratory failure with hypoxia ([facility]   . Goals of care, counseling/discussion 04/05/2015  . Squamous cell lung cancer ([facility] 09/10/2013  . 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 CATARACT EXTRACTION W/PHACO  07/12/2011   Procedure: CATARACT EXTRACTION PHACO AND INTRAOCULAR LENS PLACEMENT (IOC);  Surgeon: GAdonis Brook  [facility];  Service: Ophthalmology;  Laterality: Left;  . EYE SURGERY     L eye  . RADIOLOGY WITH ANESTHESIA N/A 01/17/2017   Procedure: MElvera BickerBRAIN WITH AND WITHOUT CONTRAST;  Surgeon: Radiologist, Medication, MD;  Location: [facility]  Service: Radiology;  Laterality: N/A;  . VIDEO BRONCHOSCOPY Bilateral 10/01/2013   Procedure: VIDEO BRONCHOSCOPY WITHOUT FLUORO;  Surgeon: RCollene Gobble MD;  [place] [facility]  Service: Cardiopulmonary;  Laterality: Bilateral;       Home Medications    Prior to Admission medications   Medication Sig Start Date End Date Taking? Authorizing Provider  acetaminophen (TYLENOL) 500 MG tablet Take 500 mg by mouth every 4 (four) hours as needed for mild pain or headache.    Yes [provider]  bisacodyl (DULCOLAX)  5 MG EC tablet Take 5 mg by mouth daily as needed for mild constipation.    Yes [provider]  diazepam (DIASTAT ACUDIAL) 10 MG GEL Place 5 mg rectally once as needed for seizure.   Yes [provider]  divalproex (DEPAKOTE ER) 250 MG 24 hr tablet Take 3  tablets (750 mg total) by mouth 2 (two) times daily. 03/08/17  Yes Melvenia Beam, MD  ENSURE (ENSURE) Take 237 mLs by mouth 3 (three) times daily.   Yes [provider]  fluticasone (FLONASE) 50 MCG/ACT nasal spray Place 2 sprays into both nostrils daily.   Yes [provider]  HYDROcodone-homatropine (HYCODAN) 5-1.5 MG/5ML syrup Take 5 mLs by mouth every 6 (six) hours as needed for cough. 02/13/17  Yes Curt Bears, MD  ketoconazole (NIZORAL) 2 % cream Apply 1 application topically 2 (two) times daily as needed for irritation. Pt applies to buttocks.    Yes [provider]  lacosamide (VIMPAT) 200 MG TABS tablet Take 1 tablet (200 mg total) by mouth 2 (two) times daily. 03/08/17  Yes Melvenia Beam, MD  levETIRAcetam (KEPPRA XR) 500 MG 24 hr tablet TAKE THREE TABLETS BY MOUTH TWICE DAILY 03/06/17  Yes Melvenia Beam, MD  loperamide (IMODIUM A-D) 2 MG tablet Take 2 mg by mouth every 4 (four) hours as needed for diarrhea or loose stools.   Yes [provider]  LORazepam (ATIVAN) 1 MG tablet Take 2 tablets (2 mg total) by mouth at bedtime. 04/24/16  Yes Johnson, Clanford L, MD  omeprazole (PRILOSEC) 20 MG capsule Take 20 mg by mouth 2 (two) times daily.   Yes [provider]  polyethylene glycol (MIRALAX / GLYCOLAX) packet Take 17 g by mouth daily as needed for mild constipation or moderate constipation.   Yes [provider]  prochlorperazine (COMPAZINE) 10 MG tablet Take 10 mg by mouth every 6 (six) hours as needed for nausea or vomiting.   Yes [provider]  risperiDONE (RISPERDAL) 1 MG tablet Take 1 tablet (1 mg total) by mouth at bedtime. 08/25/15  Yes Theodis Blaze, MD  traZODone (DESYREL) 50 MG tablet Take 25 mg by mouth at bedtime as needed for sleep.   Yes [provider]  levofloxacin (LEVAQUIN) 500 MG tablet Take 1 tablet (500 mg total) by mouth daily. Patient not taking: Reported on 03/28/2017 02/27/17   Virgel Manifold, MD    Family History Family History  Problem Relation Age of Onset  . Hypertension Mother   . Hypertension Father     Social History Social History  Substance Use Topics  . Smoking status: Former Smoker    Packs/day: 0.50    Years: 35.00    Types: Cigarettes    Quit date: 09/07/2013  . Smokeless tobacco: Never Used  . Alcohol use No     Allergies   Patient has no known allergies.   Review of Systems Review of Systems  Unable to perform ROS: Patient nonverbal     Physical Exam Updated Vital Signs BP (!) 85/70   Pulse 91   Temp 98.9 F (37.2 C) (Rectal)   Resp 16   SpO2 97%   Physical Exam Physical Exam  Nursing note and vitals reviewed. Constitutional: Cachectic, chronically ill-appearing, non-toxic, and in no acute distress Head: Normocephalic and atraumatic.  Mouth/Throat: Oropharynx is clear and moist.  Neck: Normal range of motion. Neck supple.  Cardiovascular: Tachycardic rate and regular rhythm.   Pulmonary/Chest: Effort normal. Coarse  crackles throughout. Abdominal: Soft. There is no tenderness. There is no rebound and no guarding.  Musculoskeletal: No deformities  Neurological: Somnolent, arouses to voice, and mumbles incomprehensible words no obvious facial droop, irregularity of the left pupil compared to the right (unknown chronicity), does not obey commands Skin: Skin is warm and dry.  Psychiatric: Cooperative   ED Treatments / Results  Labs (all labs ordered are listed, but only abnormal results are displayed) Labs Reviewed  CBC WITH DIFFERENTIAL/PLATELET - Abnormal; Notable for the following:       Result Value   WBC 11.8 (*)    RBC 4.08 (*)    Hemoglobin 10.1 (*)    HCT 34.4 (*)    MCH 24.8 (*)    MCHC 29.4 (*)    Neutro Abs 10.1 (*)    Lymphs Abs 0.4 (*)    Monocytes Absolute 1.3 (*)    All other components within normal limits  COMPREHENSIVE METABOLIC PANEL - Abnormal; Notable for the following:    Sodium 146 (*)     Glucose, Bld 103 (*)    Calcium 8.0 (*)    Total Protein 6.3 (*)    Albumin 2.1 (*)    All other components within normal limits  URINE CULTURE  CULTURE, BLOOD (ROUTINE X 2)  CULTURE, BLOOD (ROUTINE X 2)  URINALYSIS, ROUTINE W REFLEX MICROSCOPIC  I-STAT CG4 LACTIC ACID, ED  I-STAT TROPONIN, ED  CBG MONITORING, ED  I-STAT CG4 LACTIC ACID, ED    EKG  EKG Interpretation  Date/Time:  Wednesday March 28 2017 11:30:12 EDT Ventricular Rate:  110 PR Interval:    QRS Duration: 68 QT Interval:  296 QTC Calculation: 401 R Axis:   73 Text Interpretation:  Sinus tachycardia Anteroseptal infarct, age indeterminate No acute changes Confirmed by Brantley Stage [phone]) on 03/28/2017 12:03:10 PM       Radiology Ct Head Wo Contrast  Result Date: 03/28/2017 CLINICAL DATA:  Altered mental status.  Lung cancer. EXAM: CT HEAD WITHOUT CONTRAST TECHNIQUE: Contiguous axial images were obtained from the base of the skull through the vertex without intravenous contrast. COMPARISON:  None. FINDINGS: Brain: Mild atrophy. Negative for hydrocephalus. No acute infarct or mass. No edema CSF density subdural hygroma left frontal region. This measures 9 mm in thickness. Small isodense fluid collection right frontal lobe measuring approximately 3 mm. Possible chronic small subdural hematoma. No acute hemorrhage. Vascular: Negative for hyperdense vessel Skull: Negative Sinuses/Orbits: Negative Other: None IMPRESSION: 9 mm subdural hygroma on the left. Small 3 mm isodense fluid collection on the right. This may be dural thickening and/or a small chronic subdural hematoma. No acute hemorrhage or infarct. Electronically Signed   By: Franchot Gallo M.D.   On: 03/28/2017 12:17   Ct Chest W Contrast  Result Date: 03/28/2017 CLINICAL DATA:  Unresponsive. Febrile. Lung cancer. On hospice. Crackles. EXAM: CT CHEST WITH CONTRAST TECHNIQUE: Multidetector CT imaging of the chest was performed during intravenous contrast administration.  CONTRAST:  31m ISOVUE-300 IOPAMIDOL (ISOVUE-300) INJECTION 61% COMPARISON:  Plain film chest of earlier today. Chest CT of 02/27/2017. FINDINGS: Cardiovascular: Aortic atherosclerosis. Normal heart size with small pericardial effusion. No central pulmonary embolism, on this non-dedicated study. Left pulmonary artery encasement by tumor. Mediastinum/Nodes: Degradation secondary to motion and patient arm position, not raised above the head. EKG wire and lead artifact as well. No definite mediastinal or right hilar adenopathy. left hilum is directly involved with tumor. Lungs/Pleura: Moderate left-sided pleural effusion is similar. Worsened left-sided endobronchial aeration, with  caught off of the distal left mainstem bronchus. Central left upper lobe lung mass again identified. Example at 7.1 x 6.4 cm on image 56/series 3. Compare 6.3 x 5.7 cm on the prior exam. Suspect tumor extension inferiorly and posteriorly, obstructing the left lower lobe bronchus. This extension is significantly progressive. Example image 64/series 3. secondary progressive collapse of the left lower lobe. Mild patchy ground-glass opacity in the right apex is new. Upper Abdomen: High left hepatic lobe peripherally enhancing lesion measures 2.4 cm on image 115/ series 3. Compare 2.7 cm on the prior exam. Artifact degradation continuing into the upper abdomen. Normal imaged portions of the spleen, stomach, pancreas, adrenal glands. Bilateral renal scarring. Right renal cyst. Left renal too small to characterize lesions. Musculoskeletal: No gross chest wall invasion. No acute osseous abnormality. IMPRESSION: 1. Multifactorial degradation, as detailed above. 2. Enlargement of lung mass centered in the central left upper lobe. Extension into the central left lower lobe causing obstruction of the distal left mainstem bronchus and progressive left lower lobe atelectasis. 3. Similar moderate left pleural effusion. 4. Mild decrease in size of an  isolated hepatic metastasis. 5.  Aortic Atherosclerosis (ICD10-I70.0). 6. Minimal new right apical ground-glass opacity could represent infection. Electronically Signed   By: Abigail Miyamoto M.D.   On: 03/28/2017 15:29   Dg Chest Portable 1 View  Result Date: 03/28/2017 CLINICAL DATA:  Cough and altered mental status. EXAM: PORTABLE CHEST 1 VIEW COMPARISON:  None. FINDINGS: There is near complete opacification of the left hemithorax. Findings likely reflect a combination of large left pleural effusion and consolidated left lung. Underlying mass cannot be excluded. IMPRESSION: 1. Near complete opacification of the left hemithorax. Suggest further evaluation with contrast enhanced CT of the chest. Electronically Signed   By: Kerby Moors M.D.   On: 03/28/2017 12:25    Procedures Procedures (including critical care time)  Medications Ordered in ED Medications  0.9 %  sodium chloride infusion (not administered)  sodium chloride 0.9 % bolus 1,000 mL (not administered)  diazepam (DIASTAT ACUDIAL) rectal kit 5 mg (not administered)  enoxaparin (LOVENOX) injection 40 mg (not administered)  levETIRAcetam (KEPPRA) 1,500 mg in sodium chloride 0.9 % 100 mL IVPB (not administered)  valproate (DEPACON) 375 mg in dextrose 5 % 50 mL IVPB (not administered)  lacosamide (VIMPAT) 200 mg in sodium chloride 0.9 % 25 mL IVPB (not administered)  sodium chloride 0.9 % bolus 1,000 mL (0 mLs Intravenous Stopped 03/28/17 1532)  sodium chloride 0.9 % bolus 1,000 mL (0 mLs Intravenous Stopped 03/28/17 1532)  vancomycin (VANCOCIN) IVPB 1000 mg/200 mL premix (0 mg Intravenous Stopped 03/28/17 1533)  piperacillin-tazobactam (ZOSYN) IVPB 3.375 g (0 g Intravenous Stopped 03/28/17 1456)  iopamidol (ISOVUE-300) 61 % injection (75 mLs  Contrast Given 03/28/17 1324)     Initial Impression / Assessment and Plan / ED Course  I have reviewed the triage vital signs and the nursing notes.  Pertinent labs & imaging results that were  available during my care of the patient were reviewed by me and considered in my medical decision making (see chart for details).     Patient is a hospice patient, but by EMS report he is still full code. Spoke with [employer], patient's sister who is DPOA and was in [place]. Discuss goals of care, and she has now clarified that he is DO NOT RESUSCITATE. Discussed potential comfort care, but she states she is overwhelmed at this decision. She wants to speak with her brothers and other family  members first. At this time, she requests that we treat him for reversible processes but no escalation of care.   He is afebrile, but tachycardic and hypotensive. Given IVF, covered empirically with vancomycin and zosyn.  Patient CT with worsening lung cancer and pneumonia potentially. Discussed with family medicine teaching service. They will admit for ongoing management. Palliative care consult placed. They will speak with hospice and see patient in the AM.   Final Clinical Impressions(s) / ED Diagnoses   Final diagnoses:  Somnolence  Lobar pneumonia ([place])  Non-small cell cancer of left lung [facility])    New Prescriptions New Prescriptions   No medications on file     Forde Dandy, MD 03/28/17 [phone]

## 2017-03-28 NOTE — ED Notes (Signed)
Marco [name], caregiver, [phone]

## 2017-03-28 NOTE — Progress Notes (Signed)
I saw and examined Mr. Marco [name].  I have discussed with Dr. Avon Gully.  I will co sign the H&PE when it is available.   First: Two important caveats. 1. Patient is unresponsive and cannot provide a history of his own.  My team and I have done our best to piece together the history by talking with the ER physician, the patient's sister who is also apparently his guardian and I spoke to a long term caregiver from his former group home. 2. Hospice patient and yet we are unclear of his goals of care.  As best we can determine thus far, we are in the muddy middle ground between usual aggressive medical care and pure comfort care.  I worry that we may be prolonging his suffering.  Briefly, 66 yo male with longstanding cognitive problems (hence the group home and need for guardian) and metastatic non-small cell cancer refractory to treatment.  Now on residential hospice and was transferred to the ER today due to decreased level of consciousness and decreased PO intake.  Until recently, he was apparently walking, talking and eating.  Per CXR and chest CT, has an apparent new pneumonia which likely has caused this acute decline.  Issues, 1. Potentially critically ill.  I am most worried about his current BP of ~76 systolic.  Because he is a hospice patient with an extremely poor prognosis we will admit to the floor. He is now DNR, DNI.  We do not plan to consult CCM, transfer to stepdown or ICU.  We will provide reasonable curative therapy which we recognize may or may not work.   2. Pneumonia, likely sepsis.  Not tachy or hypoxic.  BP low.  We are treating with fluids and broad spectrum antibiotics. 3. Dehydration, hypernatremia, AKI.  Fluids seem reasonable. 4. Severe protein calorie malnutrition with cachexia and hypoalbuminemia.  We do not plan artificial nutritional support at this point.  Unable to take PO due to stupor. 5. Decreased level of consciousness, stupor.  Presumed secondary to his acute  infection. 6. Metastatic lung ca refractory to treatmen.

## 2017-03-28 NOTE — Progress Notes (Signed)
Palliative Medicine RN Note: Rec'd call from Dr Oleta Mouse regarding consult. Pt is active w [facility] for lung ca and was brought to ED for lethargy. Sister/POA is in [place]. Dr Oleta Mouse reports that pt is being admitted to [facility] and is requesting help with [place].   I called Amy Evans w [facility] (contact information is on Amion). This is a covered/GIP admission for hospice. During these GIP admissions, the hospice staff addresses [place]. Per my discussion w Amy and the chart, their SW has already seen the pt, and they will continue to address code status and d/c plan/GOC.   PMT will not see the pt at this time, as these concerns are better addressed by [facility], to whom this pt is admitted.   Marjie Skiff Gianny Killman, RN, BSN, [facility] 03/28/2017 4:01 PM Cell [phone] 8:00-4:00 Monday-Friday Office [phone]

## 2017-03-29 ENCOUNTER — Telehealth: Payer: Self-pay | Admitting: Medical Oncology

## 2017-03-29 LAB — CBC
HEMATOCRIT: 33.8 % — AB (ref 39.0–52.0)
Hemoglobin: 9.9 g/dL — ABNORMAL LOW (ref 13.0–17.0)
MCH: 24.7 pg — ABNORMAL LOW (ref 26.0–34.0)
MCHC: 29.3 g/dL — ABNORMAL LOW (ref 30.0–36.0)
MCV: 84.3 fL (ref 78.0–100.0)
PLATELETS: 188 10*3/uL (ref 150–400)
RBC: 4.01 MIL/uL — ABNORMAL LOW (ref 4.22–5.81)
RDW: 15.1 % (ref 11.5–15.5)
WBC: 13.4 10*3/uL — AB (ref 4.0–10.5)

## 2017-03-29 LAB — BASIC METABOLIC PANEL
Anion gap: 8 (ref 5–15)
BUN: 7 mg/dL (ref 6–20)
CO2: 29 mmol/L (ref 22–32)
CREATININE: 0.92 mg/dL (ref 0.61–1.24)
Calcium: 7.8 mg/dL — ABNORMAL LOW (ref 8.9–10.3)
Chloride: 109 mmol/L (ref 101–111)
GFR calc Af Amer: >60 mL/min (ref >60)
Glucose, Bld: 81 mg/dL (ref 65–99)
POTASSIUM: 3.6 mmol/L (ref 3.5–5.1)
SODIUM: 146 mmol/L — AB (ref 135–145)

## 2017-03-29 LAB — URINE CULTURE: Culture: NO GROWTH

## 2017-03-29 MED ORDER — LEVETIRACETAM ER 500 MG PO TB24
1500.0000 mg | ORAL_TABLET | Freq: Two times a day (BID) | ORAL | Status: DC
  Administered 2017-03-29 – 2017-03-30 (×2): 1500 mg via ORAL
  Filled 2017-03-29 (×3): qty 3

## 2017-03-29 MED ORDER — LACOSAMIDE 50 MG PO TABS
200.0000 mg | ORAL_TABLET | Freq: Two times a day (BID) | ORAL | Status: DC
  Administered 2017-03-29 – 2017-03-30 (×3): 200 mg via ORAL
  Filled 2017-03-29 (×3): qty 4

## 2017-03-29 MED ORDER — PANTOPRAZOLE SODIUM 40 MG PO TBEC
40.0000 mg | DELAYED_RELEASE_TABLET | Freq: Every day | ORAL | Status: DC
  Administered 2017-03-29 – 2017-03-30 (×2): 40 mg via ORAL
  Filled 2017-03-29 (×2): qty 1

## 2017-03-29 MED ORDER — LORAZEPAM 1 MG PO TABS
2.0000 mg | ORAL_TABLET | Freq: Every day | ORAL | Status: DC
  Administered 2017-03-29: 2 mg via ORAL
  Filled 2017-03-29: qty 2

## 2017-03-29 MED ORDER — DIVALPROEX SODIUM ER 500 MG PO TB24
750.0000 mg | ORAL_TABLET | Freq: Two times a day (BID) | ORAL | Status: DC
  Administered 2017-03-29 – 2017-03-30 (×3): 750 mg via ORAL
  Filled 2017-03-29 (×4): qty 1

## 2017-03-29 MED ORDER — LEVETIRACETAM ER 500 MG PO TB24
1500.0000 mg | ORAL_TABLET | Freq: Two times a day (BID) | ORAL | Status: DC
  Filled 2017-03-29: qty 3

## 2017-03-29 MED ORDER — RISPERIDONE 1 MG PO TABS
1.0000 mg | ORAL_TABLET | Freq: Every day | ORAL | Status: DC
  Administered 2017-03-29: 1 mg via ORAL
  Filled 2017-03-29 (×2): qty 1

## 2017-03-29 MED ORDER — FLUTICASONE PROPIONATE 50 MCG/ACT NA SUSP
2.0000 | Freq: Every day | NASAL | Status: DC
  Administered 2017-03-30: 2 via NASAL
  Filled 2017-03-29: qty 16

## 2017-03-29 NOTE — Progress Notes (Signed)
Initial Nutrition Assessment  DOCUMENTATION CODES:   Severe malnutrition in context of chronic illness  INTERVENTION:   -Magic cup TID with meals, each supplement provides 290 kcal and 9 grams of protein  -Yogurt on all meal trays  -Feeding assistance/encouragement at meal times as needed   NUTRITION DIAGNOSIS:   Malnutrition (Severe) related to chronic illness, cancer and cancer related treatments (cognitive disability, lung cancer) as evidenced by severe depletion of body fat, severe depletion of muscle mass.  GOAL:   Patient will meet greater than or equal to 90% of their needs  MONITOR:   PO intake, Supplement acceptance, Labs, Weight trends  REASON FOR ASSESSMENT:   Malnutrition Screening Tool    ASSESSMENT:   66 yo male admitted with AMS, decreased intake . Pt with hx of squamous cell lung cancer with non-resectable tumor, HTN, seizures, intellectual disability  Recorded po intake 0% this AM.   Poor dentition. Family/Caregivers at bedside and report that pt eats chopped, soft foods at baseline. Likes sweets, mashed potatoes/sweet potatoes, yogurt.  They report that typically pt eats relatively well; he feeds himself (sometimes with encouragement) and eats 3 meals per day plus snacks. Pt does not like Ensure/Boost type supplements. Recently pt has experienced decline in intake  Reports UBW lately around 124-125 pounds. Current wt 120 pounds (4% wt loss over several months which is not significant for time frame).   Nutrition-Focused physical exam completed. Findings are moderate to severe fat depletion, moderate to severe muscle depletion, and no edema.   Labs: sodium 146, Creatinine wdl Meds: reviewed  Diet Order:  DIET - DYS 1 Room service appropriate? Yes; Fluid consistency: Thin  Skin:  Reviewed, no issues  Last BM:  7/25  Height:   Ht Readings from Last 1 Encounters:  03/29/17 5\' 8"  (1.727 m)    Weight:   Wt Readings from Last 1 Encounters:   03/28/17 120 lb 5.9 oz (54.6 kg)    Ideal Body Weight:     BMI:  Body mass index is 18.3 kg/m.  Estimated Nutritional Needs:   Kcal:  1650-2030 kcals  Protein:  83-100 g  Fluid:  >/= 1.6 L  EDUCATION NEEDS:   No education needs identified at this time  [place], [place], LDN [phone] Pager  [phone] Weekend/On-Call Pager

## 2017-03-29 NOTE — Evaluation (Signed)
Clinical/Bedside Swallow Evaluation Patient Details  Name: Marco [name] MRN: [id number] Date of Birth: 1950-09-28  Today's Date: 03/29/2017 Time: SLP Start Time (ACUTE ONLY): 3664 SLP Stop Time (ACUTE ONLY): 0830 SLP Time Calculation (min) (ACUTE ONLY): 18 min  Past Medical History:  Past Medical History:  Diagnosis Date  . DEMENTIA   . Depression   . Developmental disability    developmentaly delayed  . Encounter for antineoplastic chemotherapy 10/10/2016  . Encounter for antineoplastic immunotherapy 04/05/2015; 12/21/2016  . GERD (gastroesophageal reflux disease)   . Hx of radiation therapy 11/07/13-12/24/13   lung,60Gy/35fx  . Hypertension    no medications, no documented history per caregiver at preadmission  . Lung cancer ([place])   . Mental retardation   . Pneumonia 03/28/2017  . Schizophrenia ([place])   . Seizures ([place])    "jerky kind; had them once/year til the last couple years; had 3-4 last year; at least 3 so far this year" (03/28/2017)   Past Surgical History:  Past Surgical History:  Procedure Laterality Date  . CATARACT EXTRACTION W/PHACO  07/12/2011   Procedure: CATARACT EXTRACTION PHACO AND INTRAOCULAR LENS PLACEMENT (IOC);  Surgeon: Adonis Brook;  Location: [facility];  Service: Ophthalmology;  Laterality: Left;  . EYE SURGERY     L eye  . RADIOLOGY WITH ANESTHESIA N/A 01/17/2017   Procedure: Elvera Bicker BRAIN WITH AND WITHOUT CONTRAST;  Surgeon: Radiologist, Medication, MD;  Location: [facility];  Service: Radiology;  Laterality: N/A;  . VIDEO BRONCHOSCOPY Bilateral 10/01/2013   Procedure: VIDEO BRONCHOSCOPY WITHOUT FLUORO;  Surgeon: Collene Gobble, MD;  Location: [facility];  Service: Cardiopulmonary;  Laterality: Bilateral;   HPI:  Marco Eugene Watkinsis a 66 y.o.malepresenting with altered mental status for last 24 hours. PMH is significant for intellectual disability, squamous cell lung cancer, seizures, hypertension, protein-calorie malnutrition. Per MD notes altered mental status is  most likely due to lung infection, given the results of his ct scan, cxr, and obvious nidus for infection from lung mass. CT 9 mm subdural hygroma on the left. Small 3 mm isodense fluid collection on the right. This may be dural thickening and/or a small chronic subdural hematoma. No acute hemorrhage or infarct. MBS 01/2017 revealed penetration with thin and nectar, tight UES and slow cervical transit, pt impulsive. No significant difference between thin and nectar, Dys 3 and thin recommended.    Assessment / Plan / Recommendation Clinical Impression  Pt known to this SLP from previous May 2018 admission in which MBS was performed revealing flash penetration to cords (cleared with subsequent swallows) with thin and nectar and tight upper esophageal sphincter with decreased cervical transit. Today's bedside assessment revealed multiple swallows, audible swallows and delayed throat clear x 1. Pt continues to be impulsive and requires tactile cues to remove straw from mouth. Previous MBS there was no significant difference between thin and nectar therefore thin was recommended. Pt is likely penetrating into laryngeal vestibule with higher aspiration risk however at this time, MBS not recommended as do not suspect a significant change in swallow function. Thin liquids preferred due to history of pharyngeal and cervical esophageal residue. He exhibited significant difficulty masticating solid during several attempts. Recommend Dys 1 (puree), thin liquids, straws allowed with full supervision and crush pills. Will follow up.    SLP Visit Diagnosis: Dysphagia, pharyngoesophageal phase (R13.14)    Aspiration Risk  Severe aspiration risk    Diet Recommendation Dysphagia 1 (Puree);Thin liquid   Liquid Administration via: Cup;Straw Medication Administration: Crushed with puree  Supervision: Patient able to self feed;Full supervision/cueing for compensatory strategies Compensations: Slow rate;Small  sips/bites;Minimize environmental distractions Postural Changes: Seated upright at 90 degrees;Remain upright for at least 30 minutes after po intake    Other  Recommendations Oral Care Recommendations: Oral care BID   Follow up Recommendations  (TBD)      Frequency and Duration min 1 x/week  2 weeks       Prognosis Prognosis for Safe Diet Advancement: Fair Barriers to Reach Goals: Cognitive deficits      Swallow Study   General HPI: Marco Bunyard Watkinsis a 66 y.o.malepresenting with altered mental status for last 24 hours. PMH is significant for intellectual disability, squamous cell lung cancer, seizures, hypertension, protein-calorie malnutrition. Per MD notes altered mental status is most likely due to lung infection, given the results of his ct scan, cxr, and obvious nidus for infection from lung mass. CT 9 mm subdural hygroma on the left. Small 3 mm isodense fluid collection on the right. This may be dural thickening and/or a small chronic subdural hematoma. No acute hemorrhage or infarct. MBS 01/2017 revealed penetration with thin and nectar, tight UES and slow cervical transit, pt impulsive. No significant difference between thin and nectar, Dys 3 and thin recommended.  Type of Study: Bedside Swallow Evaluation Previous Swallow Assessment:  (see HPI) Diet Prior to this Study: NPO Temperature Spikes Noted: No Respiratory Status: Room air History of Recent Intubation: No Behavior/Cognition: Alert;Cooperative;Pleasant mood;Requires cueing Oral Cavity Assessment: Within Functional Limits Oral Care Completed by SLP: No Oral Cavity - Dentition: Poor condition;Missing dentition Vision: Functional for self-feeding Self-Feeding Abilities: Able to feed self;Needs assist;Needs set up Patient Positioning: Upright in bed Baseline Vocal Quality: Normal Volitional Cough: Weak Volitional Swallow: Unable to elicit    Oral/Motor/Sensory Function Overall Oral Motor/Sensory Function:  (no focal  weakness)   Ice Chips Ice chips: Not tested   Thin Liquid Thin Liquid: Impaired Presentation: Cup;Straw Oral Phase Impairments: Reduced labial seal Oral Phase Functional Implications: Right anterior spillage;Left anterior spillage Pharyngeal  Phase Impairments: Suspected delayed Swallow;Multiple swallows;Throat Clearing - Delayed (audible swallow)    Nectar Thick Nectar Thick Liquid: Not tested   Honey Thick Honey Thick Liquid: Not tested   Puree Puree: Impaired Presentation: Spoon Oral Phase Impairments:  (none) Oral Phase Functional Implications:  (none) Pharyngeal Phase Impairments: Suspected delayed Swallow;Multiple swallows (audible swallow)   Solid   GO   Solid: Impaired Oral Phase Impairments: Reduced lingual movement/coordination;Impaired mastication Oral Phase Functional Implications: Prolonged oral transit;Impaired mastication Pharyngeal Phase Impairments: Suspected delayed Swallow        [name] 03/29/2017,8:51 AM  [name] [name].[employer] [phone]

## 2017-03-29 NOTE — Progress Notes (Signed)
[facility] RN Visit.  This is a related and covered GIP admission of 03/28/17 with [facility] diagnosis of Lung Cancer per Dr. Karie Georges. Patient has  DNR. Caregiver activated EMS after patient was found unresponsive morning of 03/28/17. Hospice was notified. Admitted to hospital for altered mental status. Dr. Fanny Bien. [name] was notified of admission.   Patient is alert and responsive this am. Patient's speech was difficult to understand but unsure if this is patient's baseline.  Patient is in bed and has not been up this morning.  Denies pain by shaking head no.    Swallowing evaluation completed, progressed to pureed foods.   Antibiotics for diagnosis of pneumonia. PRN medications for seizure activity.   Medications:  Vancomycin 574m /1081mBID IVPB, Diazepam rectal kit 49m3mRN seizure.  Will continue to follow while hospitalized.   Thank you  [facility]  [phone] Hospital Liaisons are on AMION.

## 2017-03-29 NOTE — Telephone Encounter (Signed)
Thanks, he is inpatient currently. Reviewed chart.

## 2017-03-29 NOTE — Telephone Encounter (Signed)
Admitted to hospital

## 2017-03-29 NOTE — Progress Notes (Signed)
Family Medicine Teaching Service Daily Progress Note Intern Pager: [phone]  Patient name: Marco [name] Medical record number: [id number] Date of birth: [birth year] Age: 65 y.o. Gender: male  Primary Care Provider: Leamon Arnt, MD Consultants: Palliative Care Code Status: DNR/DNI  Pt Overview and Major Events to Date:  Admitted on 7/25.  Assessment and Plan:  Marco [name] is a 66 y.o. male presenting with altered mental status for last 24 hours. PMH is significant for intellectual disability, squamous cell lung cancer, seizures, hypertension, protein-calorie malnutrition. From chart review the patient is demented and has intellectual disability at baseline. Patient with some level of cognitive impairment at baseline, but when members of the family arrived this am they felt like he was significantly different than usual and brought him to the ED. No family at bedside during our exam so most history gleaned from ED physicians.  Altered Mental Status Patient with baseline altered mental status and his exact baseline is unclear at this time. Per reports he is significantly altered. Altered mental status is most likely due to lung infection, given the results of his ct scan, cxr, and obvious nidus for infection from lung mass. He has a slightly increased wbc, tachycardic, and hypotensive. Other sources of infection could be meningeal, urine, or blood. Patient with no signs of meningeal inflammation although difficult to tell due to ams. UA (-) for any sign of infection. Blood cultures pending. Other possible causes for ams could be from stroke, trauma, seizure, medications, or toxins. Per history and presentation all of these seem unlikely aside from stroke and trauma which is unlikely given the CT scan of the head. - vitals per floor - o2 sats with vitals - continue vanc/zosyn, pharmacy to dose - dysphagia 1 diet, tolerating well - continue home keppra, vimpat, diazapam, valproic acid for  seizure ppx - f/u blood cultures - will discuss comfort measures with family when they arrive - daily cbc, bmp - Amy Evans [facility] is following, so is SW with [facility]  H/O seizures Is possible cause for altered mental status (with a resulting post ictal state). Due to long time period patient has been altered, and no witnessed seizure this is less likely than infection. Will continue vimpat, keppra, diazapam, valproic acid for seizure ppx in iv form - monitor s/s of seizure - no neuro consult indicated at this time  Hypotension Patient with hypotension down to 35T systolic on presentation. Unclear if hypotension directly related to presumed sepsis or is a baseline finding. Receiving mivf, so expect this resolve. Suspect that antibiotics will also help resolve as treating the source of patinet's presumed sepsis.  Current BP is 108/65 - continue vanc/zosyn - no use of pressors due to code status  Hypernatremia Only very slightly elevated at 146. Will monitor with daily bmp. Giving NS. Patient with good kidney function so will likely correct itself.  BMP on 7/26 shows sodium of 146 -Daily BMP  Squamous Cell Lung Cancer Patient with apparently non-resectable tumor and is reason for patient already being on hospice. Per report the patient has stopped all treatment measures. Maintaining O2 sats in mid 90s on RA. Will continue to monitor respiratory status while here. - Continue to monitor respiratory status  Disposition plan While patient was made DNR/DNI and is currently in hospice the family has thus far not considered comfort measures until they are able to come visit patient. Will further discuss code status and comfort measures when family  arrives. - [facility] on board  FEN/GI: dysphagia 1 diet Prophylaxis: lovenox   Disposition: [facility]  Subjective:  Patient communicative and interactive; however, I cannot understand most of what  he is saying.  He appears comfortable sitting up in bed.  Objective: Temp:  [98 F (36.7 C)-99.5 F (37.5 C)] 98.3 F (36.8 C) (07/26 0548) Pulse Rate:  [90-115] 97 (07/26 0548) Resp:  [15-25] 20 (07/26 0548) BP: (76-108)/(57-83) 108/65 (07/26 0548) SpO2:  [94 %-100 %] 99 % (07/26 0548) Weight:  [110 lb 3.7 oz (50 kg)-120 lb 12.8 oz (54.8 kg)] 120 lb 5.9 oz (54.6 kg) (07/25 2128) Physical Exam: General: appears comfortable and in NAD Cardiovascular: RRR, no MRG Respiratory: coarse rhonchi bilaterally, worse on left side Abdomen: +bowel sounds, nontender Extremities: no edema or lesions noted  Laboratory:  Recent Labs Lab 03/28/17 1051 03/29/17 0343  WBC 11.8* 13.4*  HGB 10.1* 9.9*  HCT 34.4* 33.8*  PLT 188 188    Recent Labs Lab 03/28/17 1051 03/29/17 0343  NA 146* 146*  K 3.5 3.6  CL 107 109  CO2 31 29  BUN 9 7  CREATININE 1.05 0.92  CALCIUM 8.0* 7.8*  PROT 6.3*  --   BILITOT 0.6  --   ALKPHOS 53  --   ALT 23  --   AST 38  --   GLUCOSE 103* 81     Imaging/Diagnostic Tests: Ct Head Wo Contrast  Result Date: 03/28/2017 CLINICAL DATA:  Altered mental status.  Lung cancer. EXAM: CT HEAD WITHOUT CONTRAST TECHNIQUE: Contiguous axial images were obtained from the base of the skull through the vertex without intravenous contrast. COMPARISON:  None. FINDINGS: Brain: Mild atrophy. Negative for hydrocephalus. No acute infarct or mass. No edema CSF density subdural hygroma left frontal region. This measures 9 mm in thickness. Small isodense fluid collection right frontal lobe measuring approximately 3 mm. Possible chronic small subdural hematoma. No acute hemorrhage. Vascular: Negative for hyperdense vessel Skull: Negative Sinuses/Orbits: Negative Other: None IMPRESSION: 9 mm subdural hygroma on the left. Small 3 mm isodense fluid collection on the right. This may be dural thickening and/or a small chronic subdural hematoma. No acute hemorrhage or infarct. Electronically  Signed   By: Franchot Gallo M.D.   On: 03/28/2017 12:17   Ct Chest W Contrast  Result Date: 03/28/2017 CLINICAL DATA:  Unresponsive. Febrile. Lung cancer. On hospice. Crackles. EXAM: CT CHEST WITH CONTRAST TECHNIQUE: Multidetector CT imaging of the chest was performed during intravenous contrast administration. CONTRAST:  59mL ISOVUE-300 IOPAMIDOL (ISOVUE-300) INJECTION 61% COMPARISON:  Plain film chest of earlier today. Chest CT of 02/27/2017. FINDINGS: Cardiovascular: Aortic atherosclerosis. Normal heart size with small pericardial effusion. No central pulmonary embolism, on this non-dedicated study. Left pulmonary artery encasement by tumor. Mediastinum/Nodes: Degradation secondary to motion and patient arm position, not raised above the head. EKG wire and lead artifact as well. No definite mediastinal or right hilar adenopathy. left hilum is directly involved with tumor. Lungs/Pleura: Moderate left-sided pleural effusion is similar. Worsened left-sided endobronchial aeration, with caught off of the distal left mainstem bronchus. Central left upper lobe lung mass again identified. Example at 7.1 x 6.4 cm on image 56/series 3. Compare 6.3 x 5.7 cm on the prior exam. Suspect tumor extension inferiorly and posteriorly, obstructing the left lower lobe bronchus. This extension is significantly progressive. Example image 64/series 3. secondary progressive collapse of the left lower lobe. Mild patchy ground-glass opacity in the right apex is new. Upper Abdomen: High left hepatic lobe  peripherally enhancing lesion measures 2.4 cm on image 115/ series 3. Compare 2.7 cm on the prior exam. Artifact degradation continuing into the upper abdomen. Normal imaged portions of the spleen, stomach, pancreas, adrenal glands. Bilateral renal scarring. Right renal cyst. Left renal too small to characterize lesions. Musculoskeletal: No gross chest wall invasion. No acute osseous abnormality. IMPRESSION: 1. Multifactorial  degradation, as detailed above. 2. Enlargement of lung mass centered in the central left upper lobe. Extension into the central left lower lobe causing obstruction of the distal left mainstem bronchus and progressive left lower lobe atelectasis. 3. Similar moderate left pleural effusion. 4. Mild decrease in size of an isolated hepatic metastasis. 5.  Aortic Atherosclerosis (ICD10-I70.0). 6. Minimal new right apical ground-glass opacity could represent infection. Electronically Signed   By: Abigail Miyamoto M.D.   On: 03/28/2017 15:29   Dg Chest Portable 1 View  Result Date: 03/28/2017 CLINICAL DATA:  Cough and altered mental status. EXAM: PORTABLE CHEST 1 VIEW COMPARISON:  None. FINDINGS: There is near complete opacification of the left hemithorax. Findings likely reflect a combination of large left pleural effusion and consolidated left lung. Underlying mass cannot be excluded. IMPRESSION: 1. Near complete opacification of the left hemithorax. Suggest further evaluation with contrast enhanced CT of the chest. Electronically Signed   By: Kerby Moors M.D.   On: 03/28/2017 12:25     Kathrene Alu, MD 03/29/2017, 7:12 AM PGY-1, [facility] Intern pager: [phone], text pages welcome

## 2017-03-30 DIAGNOSIS — R400 Somnolence: Secondary | ICD-10-CM

## 2017-03-30 MED ORDER — AMOXICILLIN-POT CLAVULANATE 875-125 MG PO TABS: 1.0000 | ORAL_TABLET | Freq: Two times a day (BID) | ORAL | 0 refills | Status: AC

## 2017-03-30 NOTE — Discharge Instructions (Signed)
Please begin taking your antibiotic (Augmentin) one tablet twice a day for the next eight days (last dose in the evening on Saturday, August 4). Please continue taking all of your other medications as you have been.   Please schedule an appointment with your regular doctor within the next week.

## 2017-03-30 NOTE — Progress Notes (Signed)
  Speech Language Pathology Treatment: Dysphagia  Patient Details Name: Marco [name] MRN: [id number] DOB: [birth year] Today's Date: 03/30/2017 Time: 0721-8288 SLP Time Calculation (min) (ACUTE ONLY): 9 min  Assessment / Plan / Recommendation Clinical Impression  Pt exhibited pharyngeal dysphagia and s/s aspiration including immediate cough, suspected delayed swallow initiation, multiple swallows, pharyngeal residue likely and audible swallow. Pt is chronic aspirator now receiving hospice care. Caregiver arrived and SLP educated him re: downgrade of solids to puree. Caregiver states there has been a general decline in swallow function over 5 years. Recommend continue Dys 1 texture, thin liquids, full supervision as pt is very impulsive and requires tactile cues to remove straw, sit upright and remain upright for 30 minutes after meals. No further ST needed.    HPI HPI: Marco Vences Watkinsis a 66 y.o.malepresenting with altered mental status for last 24 hours. PMH is significant for intellectual disability, squamous cell lung cancer, seizures, hypertension, protein-calorie malnutrition. Per MD notes altered mental status is most likely due to lung infection, given the results of his ct scan, cxr, and obvious nidus for infection from lung mass. CT 9 mm subdural hygroma on the left. Small 3 mm isodense fluid collection on the right. This may be dural thickening and/or a small chronic subdural hematoma. No acute hemorrhage or infarct. MBS 01/2017 revealed penetration with thin and nectar, tight UES and slow cervical transit, pt impulsive. No significant difference between thin and nectar, Dys 3 and thin recommended.       SLP Plan  All goals met;Discharge SLP treatment due to (comment)       Recommendations  Diet recommendations: Dysphagia 1 (puree);Thin liquid Liquids provided via: Straw;Cup Medication Administration: Crushed with puree Supervision: Patient able to self feed;Full  supervision/cueing for compensatory strategies Compensations: Slow rate;Small sips/bites;Minimize environmental distractions Postural Changes and/or Swallow Maneuvers: Seated upright 90 degrees;Upright 30-60 min after meal                Oral Care Recommendations: Oral care BID Follow up Recommendations: None SLP Visit Diagnosis: Dysphagia, pharyngoesophageal phase (R13.14) Plan: All goals met;Discharge SLP treatment due to (comment)       GO                Marco [name] 03/30/2017, 9:44 AM  Marco [name].Ed [employer] [phone]

## 2017-03-30 NOTE — Progress Notes (Signed)
Discharge instructions and medications discussed with patient and caregiver.  All questions answered.

## 2017-03-30 NOTE — Progress Notes (Addendum)
[facility] RN Visit.  This is a related and covered GIP admission of 03/28/17 with [facility] diagnosis of Lung Cancer per Dr. Karie Georges. Patient has  DNR. Caregiver activated EMS after patient was found unresponsive morning of 03/28/17. Hospice was notified. Admitted to hospital for altered mental status. Dr. Fanny Bien. Marco [name] was notified of admission.   Patient is alert and responsive this am. No complaints of pain.  Sister, Marco [name] is at bedside. Discharge plans for today or tomorrow.     Antibiotics for diagnosis of pneumonia. PRN medications for seizure activity.   Medications:  Vancomycin '500mg'$  /152m BID IVPB, Zosyn IVPB 3.375g TID,   Diazepam rectal kit '5mg'$  PRN seizure.  Will continue to follow while hospitalized and anticipate any discharge needs.   Thank you  [facility]  [phone] All Hospital Liaisons are on AMION  Update:  [facility] not [facility] at this time.

## 2017-03-30 NOTE — Care Management Note (Signed)
Case Management Note  Patient Details  Name: Marco [name] MRN: [id number] Date of Birth: 1951/08/20  Subjective/Objective:      CM following for progression and d/c planning.               Action/Plan: 03/30/2017 Pt d/c to home with home hospice services, hospice aware of d/c and will resume [facility] services.   Expected Discharge Date:  03/30/17               Expected Discharge Plan:  Home w Hospice Care  In-House Referral:  NA  Discharge planning Services  CM Consult  Post Acute Care Choice:  Hospice Choice offered to:  Patient  DME Arranged:  N/A DME Agency:  NA  HH Arranged:  RN, Social Work, Nurse's Aide [place] Agency:  [facility] of Service:  Completed, signed off  If discussed at [employer], dates discussed:    Additional Comments:  Adron Bene, RN 03/30/2017, 4:28 PM

## 2017-03-30 NOTE — Progress Notes (Signed)
Family Medicine Teaching Service Daily Progress Note Intern Pager: [phone]  Patient name: Marco [name] Medical record number: [id number] Date of birth: 1951/01/16 Age: 66 y.o. Gender: male  Primary Care Provider: Leamon Arnt, MD Consultants: Palliative Care Code Status: DNR/DNI  Pt Overview and Major Events to Date:  Admitted on 7/25.  Assessment and Plan:  Marco [name] is a 66 y.o. male presenting with altered mental status for last 24 hours. PMH is significant for intellectual disability, squamous cell lung cancer, seizures, hypertension, protein-calorie malnutrition. From chart review the patient is demented and has intellectual disability at baseline. Patient with some level of cognitive impairment at baseline, but when members of the family arrived this am they felt like he was significantly different than usual and brought him to the ED. No family at bedside during our exam so most history gleaned from ED physicians.  Altered Mental Status Patient with baseline altered mental status and his exact baseline is unclear at this time. Per reports he is significantly altered. Altered mental status is most likely due to lung infection, given the results of his ct scan, cxr, and obvious nidus for infection from lung mass. He has a slightly increased wbc, tachycardic, and hypotensive. Other sources of infection could be meningeal, urine, or blood. Patient with no signs of meningeal inflammation although difficult to tell due to ams. UA (-) for any sign of infection. Blood cultures pending. Other possible causes for ams could be from stroke, trauma, seizure, medications, or toxins. Per history and presentation all of these seem unlikely aside from stroke and trauma which is unlikely given the CT scan of the head.  Urine cx negative, blood cx pending - vitals per floor - o2 sats with vitals - continue vanc/zosyn, pharmacy to dose; will change regimen if patient continues to be improved for  48 hours - dysphagia 1 diet, tolerating well - continue home keppra, vimpat, diazapam, valproic acid for seizure ppx - f/u blood cultures - will discuss comfort measures with family when they arrive - daily cbc, bmp - [facility] is following, so is SW with [facility] - transition to PO augmentin and azithromycin  H/O seizures Is possible cause for altered mental status (with a resulting post ictal state). Due to long time period patient has been altered, and no witnessed seizure this is less likely than infection. Will continue vimpat, keppra, diazapam, valproic acid for seizure ppx in iv form - monitor s/s of seizure - no neuro consult indicated at this time  Hypotension Patient with hypotension down to 97L systolic on presentation. Unclear if hypotension directly related to presumed sepsis or is a baseline finding. Receiving mivf, so expect this resolve. Suspect that antibiotics will also help resolve as treating the source of patinet's presumed sepsis.  Current BP is 108/65 - continue vanc/zosyn - no use of pressors due to code status  Hypernatremia Only very slightly elevated at 146. Will monitor with daily bmp. Giving NS. Patient with good kidney function so will likely correct itself.  BMP on 7/26 shows sodium of 146 -Daily BMP  Squamous Cell Lung Cancer Patient with apparently non-resectable tumor and is reason for patient already being on hospice. Per report the patient has stopped all treatment measures. Maintaining O2 sats in mid 90s on RA. Will continue to monitor respiratory status while here. - Continue to monitor respiratory status  Disposition plan While patient was made DNR/DNI and is currently in hospice the  family has thus far not considered comfort measures until they are able to come visit patient. Will further discuss code status and comfort measures when family arrives. - [facility] on board  - need to coordinate plan for  discharge with family and caretaker   FEN/GI: dysphagia 1 diet Prophylaxis: lovenox   Disposition:  Hospice  Subjective:  Patient communicative and interactive; however, I cannot understand most of what he is saying.  He appears comfortable sitting up in bed.  Spoke with sister regarding his current state and her wishes for his care, and she said that her goal was comfort for him.  Objective: Temp:  [98.4 F (36.9 C)-98.7 F (37.1 C)] 98.5 F (36.9 C) (07/27 0458) Pulse Rate:  [94-114] 99 (07/27 0458) Resp:  [18-20] 20 (07/27 0458) BP: (90-110)/(51-71) 93/65 (07/27 0458) SpO2:  [100 %] 100 % (07/27 0458) Weight:  [120 lb 2.4 oz (54.5 kg)] 120 lb 2.4 oz (54.5 kg) (07/26 2052) Physical Exam: General: appears comfortable and in NAD Cardiovascular: RRR, no MRG Respiratory: coarse rhonchi bilaterally, worse on left side Abdomen: +bowel sounds, nontender Extremities: no edema or lesions noted  Laboratory:  Recent Labs Lab 03/28/17 1051 03/29/17 0343  WBC 11.8* 13.4*  HGB 10.1* 9.9*  HCT 34.4* 33.8*  PLT 188 188    Recent Labs Lab 03/28/17 1051 03/29/17 0343  NA 146* 146*  K 3.5 3.6  CL 107 109  CO2 31 29  BUN 9 7  CREATININE 1.05 0.92  CALCIUM 8.0* 7.8*  PROT 6.3*  --   BILITOT 0.6  --   ALKPHOS 53  --   ALT 23  --   AST 38  --   GLUCOSE 103* 81     Imaging/Diagnostic Tests: No results found.   Kathrene Alu, MD 03/30/2017, 7:25 AM PGY-1, [place] Intern pager: [phone], text pages welcome

## 2017-03-30 NOTE — Progress Notes (Signed)
PT Cancellation Note  Patient Details Name: Marco [name] MRN: [id number] DOB: 1951/02/14   Cancelled Treatment:    Reason Eval/Treat Not Completed: Other (comment) Pt is in th process of being discharged. No PT needs needed at this time. Pt is a hospice patient.    Scheryl Marten PT, DPT  [phone]  03/30/2017, 4:01 PM

## 2017-03-31 NOTE — Discharge Summary (Signed)
[facility] Discharge Summary  Patient name: Marco [name] Medical record number: [id number] Date of birth: 1950-11-24 Age: 66 y.o. Gender: male Date of Admission: 03/28/2017  Date of Discharge: 03/30/17 Admitting Physician: Marco Resides, MD  Primary Care Provider: Leamon Arnt, MD Consultants: none  Indication for Hospitalization: AMS  Discharge Diagnoses/Problem List:  1.  Altered mental status - patient has intellectual disability at baseline, but on presentation to the ED, he was unresponsive.  While he improved during hospitalization, his sister says that his communication and mobility have both declined in the recent months.  2.  Pneumonia - patient was treated for pneumonia while hospitalized and improved rapidly.  He is at risk for aspiration pneumonia given his cognitive decline.  3.  Stage IV lung cancer - patient is no longer receiving chemotherapy for his lung cancer.  Lung mass in his left lung is enlarging, and he has metastasis to his liver.  4.  Seizures - patient did not experience any seizures while hospitalized.  He should continue his current anti-seizure regimen.  5.  Protein-Calorie Malnutrition - patient appeared very thin and had temporal wasting.  Nutrition recommended magic cup TID with meals as well as yogurt on all meal trays.    Disposition: group home  Discharge Condition: stable, improving  Discharge Exam: please see progress note from day of discharge  Brief Hospital Course:  Marco [name] presented to the ED on 7/25 with altered mental status over the past 24 hours.  On CXR and CT, there was concern for development of right apical pneumonia, so he was treated with vancomycin/zosyn.  He improved greatly within a few hours and continued to improve throughout his hospital stay.  [facility] spoke with patient's sister regarding her goals of care for him, and she said that she wanted to focus on comfort care  for him, but that he should be a full code once he left the hospital.  Due to his continued improvement and negative blood cultures, he was transitioned to Augmentin to continue treatment of his pneumonia after discharge.  Issues for Follow Up:  1. Goals of care - as patient continues to decline due to his lung cancer diagnosis, frequent discussions may be necessary to elucidate the family's goals for his care. 2. Pneumonia - follow up CXR may be helpful in tracking patient's improvement.  Marco [name] has a high risk for aspiration due to his AMS and tendency to drink fluids very fast, so his intake should be monitored, and he should be on a dysphagia 1 diet. 3. Mental status - patient's sister says that his ability to communicate and his mobility have both decreased over the last few months.  He should be monitored closely for his safety and to detect any acute changes that may reflect new infection. 4. Protein-calorie malnutrition - if possible, his diet should be monitored and supplemented as needed to achieve better nutrition, although this may be part of his cancer progression.  Significant Procedures: none  Significant Labs and Imaging:   Recent Labs Lab 03/28/17 1051 03/29/17 0343  WBC 11.8* 13.4*  HGB 10.1* 9.9*  HCT 34.4* 33.8*  PLT 188 188    Recent Labs Lab 03/28/17 1051 03/29/17 0343  NA 146* 146*  K 3.5 3.6  CL 107 109  CO2 31 29  GLUCOSE 103* 81  BUN 9 7  CREATININE 1.05 0.92  CALCIUM 8.0* 7.8*  ALKPHOS 53  --   AST 38  --  ALT 23  --   ALBUMIN 2.1*  --     Ct Head Wo Contrast  Result Date: 03/28/2017 CLINICAL DATA:  Altered mental status.  Lung cancer. EXAM: CT HEAD WITHOUT CONTRAST TECHNIQUE: Contiguous axial images were obtained from the base of the skull through the vertex without intravenous contrast. COMPARISON:  None. FINDINGS: Brain: Mild atrophy. Negative for hydrocephalus. No acute infarct or mass. No edema CSF density subdural hygroma left frontal  region. This measures 9 mm in thickness. Small isodense fluid collection right frontal lobe measuring approximately 3 mm. Possible chronic small subdural hematoma. No acute hemorrhage. Vascular: Negative for hyperdense vessel Skull: Negative Sinuses/Orbits: Negative Other: None IMPRESSION: 9 mm subdural hygroma on the left. Small 3 mm isodense fluid collection on the right. This may be dural thickening and/or a small chronic subdural hematoma. No acute hemorrhage or infarct. Electronically Signed   By: Franchot Gallo M.D.   On: 03/28/2017 12:17   Ct Chest W Contrast  Result Date: 03/28/2017 CLINICAL DATA:  Unresponsive. Febrile. Lung cancer. On hospice. Crackles. EXAM: CT CHEST WITH CONTRAST TECHNIQUE: Multidetector CT imaging of the chest was performed during intravenous contrast administration. CONTRAST:  18mL ISOVUE-300 IOPAMIDOL (ISOVUE-300) INJECTION 61% COMPARISON:  Plain film chest of earlier today. Chest CT of 02/27/2017. FINDINGS: Cardiovascular: Aortic atherosclerosis. Normal heart size with small pericardial effusion. No central pulmonary embolism, on this non-dedicated study. Left pulmonary artery encasement by tumor. Mediastinum/Nodes: Degradation secondary to motion and patient arm position, not raised above the head. EKG wire and lead artifact as well. No definite mediastinal or right hilar adenopathy. left hilum is directly involved with tumor. Lungs/Pleura: Moderate left-sided pleural effusion is similar. Worsened left-sided endobronchial aeration, with caught off of the distal left mainstem bronchus. Central left upper lobe lung mass again identified. Example at 7.1 x 6.4 cm on image 56/series 3. Compare 6.3 x 5.7 cm on the prior exam. Suspect tumor extension inferiorly and posteriorly, obstructing the left lower lobe bronchus. This extension is significantly progressive. Example image 64/series 3. secondary progressive collapse of the left lower lobe. Mild patchy ground-glass opacity in the  right apex is new. Upper Abdomen: High left hepatic lobe peripherally enhancing lesion measures 2.4 cm on image 115/ series 3. Compare 2.7 cm on the prior exam. Artifact degradation continuing into the upper abdomen. Normal imaged portions of the spleen, stomach, pancreas, adrenal glands. Bilateral renal scarring. Right renal cyst. Left renal too small to characterize lesions. Musculoskeletal: No gross chest wall invasion. No acute osseous abnormality. IMPRESSION: 1. Multifactorial degradation, as detailed above. 2. Enlargement of lung mass centered in the central left upper lobe. Extension into the central left lower lobe causing obstruction of the distal left mainstem bronchus and progressive left lower lobe atelectasis. 3. Similar moderate left pleural effusion. 4. Mild decrease in size of an isolated hepatic metastasis. 5.  Aortic Atherosclerosis (ICD10-I70.0). 6. Minimal new right apical ground-glass opacity could represent infection. Electronically Signed   By: Abigail Miyamoto M.D.   On: 03/28/2017 15:29   Dg Chest Portable 1 View  Result Date: 03/28/2017 CLINICAL DATA:  Cough and altered mental status. EXAM: PORTABLE CHEST 1 VIEW COMPARISON:  None. FINDINGS: There is near complete opacification of the left hemithorax. Findings likely reflect a combination of large left pleural effusion and consolidated left lung. Underlying mass cannot be excluded. IMPRESSION: 1. Near complete opacification of the left hemithorax. Suggest further evaluation with contrast enhanced CT of the chest. Electronically Signed   By: Queen Slough.D.  On: 03/28/2017 12:25    Results/Tests Pending at Time of Discharge: none  Discharge Medications:  Allergies as of 03/30/2017   No Known Allergies     Medication List    STOP taking these medications   levofloxacin 500 MG tablet Commonly known as:  LEVAQUIN     TAKE these medications   acetaminophen 500 MG tablet Commonly known as:  TYLENOL Take 500 mg by mouth  every 4 (four) hours as needed for mild pain or headache.   amoxicillin-clavulanate 875-125 MG tablet Commonly known as:  AUGMENTIN Take 1 tablet by mouth 2 (two) times daily.   bisacodyl 5 MG EC tablet Commonly known as:  DULCOLAX Take 5 mg by mouth daily as needed for mild constipation.   diazepam 10 MG Gel Commonly known as:  DIASTAT ACUDIAL Place 5 mg rectally once as needed for seizure.   divalproex 250 MG 24 hr tablet Commonly known as:  DEPAKOTE ER Take 3 tablets (750 mg total) by mouth 2 (two) times daily.   ENSURE Take 237 mLs by mouth 3 (three) times daily.   fluticasone 50 MCG/ACT nasal spray Commonly known as:  FLONASE Place 2 sprays into both nostrils daily.   HYDROcodone-homatropine 5-1.5 MG/5ML syrup Commonly known as:  HYCODAN Take 5 mLs by mouth every 6 (six) hours as needed for cough.   IMODIUM A-D 2 MG tablet Generic drug:  loperamide Take 2 mg by mouth every 4 (four) hours as needed for diarrhea or loose stools.   ketoconazole 2 % cream Commonly known as:  NIZORAL Apply 1 application topically 2 (two) times daily as needed for irritation. Pt applies to buttocks.   lacosamide 200 MG Tabs tablet Commonly known as:  VIMPAT Take 1 tablet (200 mg total) by mouth 2 (two) times daily.   levETIRAcetam 500 MG 24 hr tablet Commonly known as:  KEPPRA XR TAKE THREE TABLETS BY MOUTH TWICE DAILY   LORazepam 1 MG tablet Commonly known as:  ATIVAN Take 2 tablets (2 mg total) by mouth at bedtime.   omeprazole 20 MG capsule Commonly known as:  PRILOSEC Take 20 mg by mouth 2 (two) times daily.   polyethylene glycol packet Commonly known as:  MIRALAX / GLYCOLAX Take 17 g by mouth daily as needed for mild constipation or moderate constipation.   prochlorperazine 10 MG tablet Commonly known as:  COMPAZINE Take 10 mg by mouth every 6 (six) hours as needed for nausea or vomiting.   risperiDONE 1 MG tablet Commonly known as:  RISPERDAL Take 1 tablet (1 mg  total) by mouth at bedtime.   traZODone 50 MG tablet Commonly known as:  DESYREL Take 25 mg by mouth at bedtime as needed for sleep.       Discharge Instructions: Please refer to Patient Instructions section of EMR for full details.  Patient was counseled important signs and symptoms that should prompt return to medical care, changes in medications, dietary instructions, activity restrictions, and follow up appointments.   Follow-Up Appointments: Follow-up Information    Marco Arnt, MD. Schedule an appointment as soon as possible for a visit.   Specialty:  Family Medicine Why:  Within one week Contact information: [address] [place] [address] [phone]           Kathrene Alu, MD 03/31/2017, 6:06 PM PGY-1, [place]

## 2017-04-02 LAB — CULTURE, BLOOD (ROUTINE X 2)
CULTURE: NO GROWTH
Special Requests: ADEQUATE

## 2017-04-03 LAB — CULTURE, BLOOD (ROUTINE X 2): CULTURE: NO GROWTH

## 2017-04-04 ENCOUNTER — Telehealth: Payer: Self-pay | Admitting: Neurology

## 2017-04-04 ENCOUNTER — Other Ambulatory Visit: Payer: Self-pay

## 2017-04-04 ENCOUNTER — Other Ambulatory Visit: Payer: Self-pay | Admitting: Neurology

## 2017-04-04 MED ORDER — LEVETIRACETAM 750 MG PO TABS: 1500.0000 mg | ORAL_TABLET | Freq: Two times a day (BID) | ORAL | 11 refills | Status: DC

## 2017-04-04 NOTE — Telephone Encounter (Signed)
Hospice patient who is currently on Keppra XR 1500 mg BID along w/ Vimpat 200 mg BID.

## 2017-04-04 NOTE — Telephone Encounter (Signed)
Marco [name] POA is calling stating patient is having trouble swallowing and would like levETIRAcetam (KEPPRA XR) 500 MG 24 hr tablet to be changed to pill so it can be crushed for easy swallowing. Please call to [facility].

## 2017-04-04 NOTE — Telephone Encounter (Signed)
done

## 2017-05-24 ENCOUNTER — Telehealth: Payer: Self-pay

## 2017-05-24 NOTE — Telephone Encounter (Signed)
Marco [name] with [name] called for VO to proceed with hospice into next hospice benefit period beginning Oct 13. She needs this per medicare guidelines.  VO given per Dr [name].

## 2017-06-08 ENCOUNTER — Other Ambulatory Visit: Payer: Self-pay | Admitting: *Deleted

## 2017-06-08 DIAGNOSIS — C3490 Malignant neoplasm of unspecified part of unspecified bronchus or lung: Secondary | ICD-10-CM

## 2017-06-08 MED ORDER — HYDROCODONE-HOMATROPINE 5-1.5 MG/5ML PO SYRP: 5.0000 mL | ORAL_SOLUTION | Freq: Four times a day (QID) | ORAL | 0 refills | Status: AC | PRN

## 2017-06-08 NOTE — Telephone Encounter (Signed)
Call from [facility] for pt requesting hycodan refill. [facility] rx ready for pick up 06/11/17 as MD out of office.

## 2017-06-27 ENCOUNTER — Inpatient Hospital Stay (HOSPITAL_COMMUNITY)
Admission: EM | Admit: 2017-06-27 | Discharge: 2017-07-06 | DRG: 871 | Disposition: A | Discharge status: Home / Self Care | Attending: Internal Medicine | Admitting: Internal Medicine

## 2017-06-27 ENCOUNTER — Emergency Department (HOSPITAL_COMMUNITY)

## 2017-06-27 ENCOUNTER — Encounter (HOSPITAL_COMMUNITY): Payer: Self-pay | Admitting: *Deleted

## 2017-06-27 DIAGNOSIS — E876 Hypokalemia: Secondary | ICD-10-CM | POA: Diagnosis present

## 2017-06-27 DIAGNOSIS — Z515 Encounter for palliative care: Secondary | ICD-10-CM | POA: Diagnosis present

## 2017-06-27 DIAGNOSIS — C3492 Malignant neoplasm of unspecified part of left bronchus or lung: Secondary | ICD-10-CM | POA: Diagnosis not present

## 2017-06-27 DIAGNOSIS — R509 Fever, unspecified: Secondary | ICD-10-CM

## 2017-06-27 DIAGNOSIS — J181 Lobar pneumonia, unspecified organism: Secondary | ICD-10-CM | POA: Diagnosis present

## 2017-06-27 DIAGNOSIS — A419 Sepsis, unspecified organism: Principal | ICD-10-CM | POA: Diagnosis present

## 2017-06-27 DIAGNOSIS — F209 Schizophrenia, unspecified: Secondary | ICD-10-CM | POA: Diagnosis present

## 2017-06-27 DIAGNOSIS — Z5112 Encounter for antineoplastic immunotherapy: Secondary | ICD-10-CM

## 2017-06-27 DIAGNOSIS — Z8249 Family history of ischemic heart disease and other diseases of the circulatory system: Secondary | ICD-10-CM | POA: Diagnosis not present

## 2017-06-27 DIAGNOSIS — R6520 Severe sepsis without septic shock: Secondary | ICD-10-CM | POA: Diagnosis present

## 2017-06-27 DIAGNOSIS — F79 Unspecified intellectual disabilities: Secondary | ICD-10-CM | POA: Diagnosis present

## 2017-06-27 DIAGNOSIS — E43 Unspecified severe protein-calorie malnutrition: Secondary | ICD-10-CM | POA: Diagnosis present

## 2017-06-27 DIAGNOSIS — C799 Secondary malignant neoplasm of unspecified site: Secondary | ICD-10-CM | POA: Diagnosis not present

## 2017-06-27 DIAGNOSIS — L89152 Pressure ulcer of sacral region, stage 2: Secondary | ICD-10-CM | POA: Diagnosis present

## 2017-06-27 DIAGNOSIS — K219 Gastro-esophageal reflux disease without esophagitis: Secondary | ICD-10-CM | POA: Diagnosis present

## 2017-06-27 DIAGNOSIS — Z5111 Encounter for antineoplastic chemotherapy: Secondary | ICD-10-CM

## 2017-06-27 DIAGNOSIS — Z87891 Personal history of nicotine dependence: Secondary | ICD-10-CM | POA: Diagnosis not present

## 2017-06-27 DIAGNOSIS — J9621 Acute and chronic respiratory failure with hypoxia: Secondary | ICD-10-CM | POA: Diagnosis present

## 2017-06-27 DIAGNOSIS — Z66 Do not resuscitate: Secondary | ICD-10-CM | POA: Diagnosis present

## 2017-06-27 DIAGNOSIS — G40909 Epilepsy, unspecified, not intractable, without status epilepticus: Secondary | ICD-10-CM | POA: Diagnosis present

## 2017-06-27 DIAGNOSIS — Z923 Personal history of irradiation: Secondary | ICD-10-CM | POA: Diagnosis not present

## 2017-06-27 DIAGNOSIS — C3490 Malignant neoplasm of unspecified part of unspecified bronchus or lung: Secondary | ICD-10-CM | POA: Diagnosis present

## 2017-06-27 DIAGNOSIS — I10 Essential (primary) hypertension: Secondary | ICD-10-CM | POA: Diagnosis present

## 2017-06-27 DIAGNOSIS — F0390 Unspecified dementia without behavioral disturbance: Secondary | ICD-10-CM | POA: Diagnosis present

## 2017-06-27 DIAGNOSIS — Y95 Nosocomial condition: Secondary | ICD-10-CM | POA: Diagnosis present

## 2017-06-27 DIAGNOSIS — L8942 Pressure ulcer of contiguous site of back, buttock and hip, stage 2: Secondary | ICD-10-CM | POA: Diagnosis not present

## 2017-06-27 DIAGNOSIS — J189 Pneumonia, unspecified organism: Secondary | ICD-10-CM

## 2017-06-27 DIAGNOSIS — L8990 Pressure ulcer of unspecified site, unspecified stage: Secondary | ICD-10-CM | POA: Insufficient documentation

## 2017-06-27 LAB — CBC WITH DIFFERENTIAL/PLATELET
Basophils Absolute: 0 10*3/uL (ref 0.0–0.1)
Basophils Relative: 0 %
EOS ABS: 0 10*3/uL (ref 0.0–0.7)
EOS PCT: 0 %
HCT: 26.7 % — ABNORMAL LOW (ref 39.0–52.0)
HEMOGLOBIN: 8.5 g/dL — AB (ref 13.0–17.0)
LYMPHS ABS: 0.3 10*3/uL — AB (ref 0.7–4.0)
LYMPHS PCT: 2 %
MCH: 25.8 pg — AB (ref 26.0–34.0)
MCHC: 31.8 g/dL (ref 30.0–36.0)
MCV: 80.9 fL (ref 78.0–100.0)
MONOS PCT: 3 %
Monocytes Absolute: 0.6 10*3/uL (ref 0.1–1.0)
Neutro Abs: 17.6 10*3/uL — ABNORMAL HIGH (ref 1.7–7.7)
Neutrophils Relative %: 95 %
PLATELETS: 289 10*3/uL (ref 150–400)
RBC: 3.3 MIL/uL — ABNORMAL LOW (ref 4.22–5.81)
RDW: 16.7 % — ABNORMAL HIGH (ref 11.5–15.5)
WBC: 18.5 10*3/uL — ABNORMAL HIGH (ref 4.0–10.5)

## 2017-06-27 LAB — URINALYSIS, ROUTINE W REFLEX MICROSCOPIC
Bilirubin Urine: NEGATIVE
Glucose, UA: NEGATIVE mg/dL
Hgb urine dipstick: NEGATIVE
Ketones, ur: NEGATIVE mg/dL
Leukocytes, UA: NEGATIVE
Nitrite: NEGATIVE
Protein, ur: NEGATIVE mg/dL
Specific Gravity, Urine: 1.019 (ref 1.005–1.030)
pH: 5 (ref 5.0–8.0)

## 2017-06-27 LAB — I-STAT CHEM 8, ED
BUN: 15 mg/dL (ref 6–20)
Calcium, Ion: 1.08 mmol/L — ABNORMAL LOW (ref 1.15–1.40)
Chloride: 107 mmol/L (ref 101–111)
Creatinine, Ser: 0.6 mg/dL — ABNORMAL LOW (ref 0.61–1.24)
Glucose, Bld: 118 mg/dL — ABNORMAL HIGH (ref 65–99)
HCT: 28 % — ABNORMAL LOW (ref 39.0–52.0)
Hemoglobin: 9.5 g/dL — ABNORMAL LOW (ref 13.0–17.0)
Potassium: 3.5 mmol/L (ref 3.5–5.1)
Sodium: 145 mmol/L (ref 135–145)
TCO2: 27 mmol/L (ref 22–32)

## 2017-06-27 LAB — MRSA PCR SCREENING: MRSA BY PCR: NEGATIVE

## 2017-06-27 LAB — I-STAT CG4 LACTIC ACID, ED: Lactic Acid, Venous: 1.15 mmol/L (ref 0.5–1.9)

## 2017-06-27 MED ORDER — DIAZEPAM 10 MG RE GEL: 5.0000 mg | Freq: Once | RECTAL | Status: DC | PRN

## 2017-06-27 MED ORDER — HYDROCODONE-HOMATROPINE 5-1.5 MG/5ML PO SYRP
5.0000 mL | ORAL_SOLUTION | Freq: Four times a day (QID) | ORAL | Status: DC | PRN
  Administered 2017-06-28 – 2017-07-02 (×3): 5 mL via ORAL
  Filled 2017-06-27 (×3): qty 5

## 2017-06-27 MED ORDER — LACOSAMIDE 50 MG PO TABS
200.0000 mg | ORAL_TABLET | Freq: Two times a day (BID) | ORAL | Status: DC
  Administered 2017-06-27 – 2017-07-05 (×16): 200 mg via ORAL
  Filled 2017-06-27 (×17): qty 4

## 2017-06-27 MED ORDER — SODIUM CHLORIDE 0.9 % IV BOLUS (SEPSIS)
1000.0000 mL | Freq: Once | INTRAVENOUS | Status: AC
  Administered 2017-06-27: 1000 mL via INTRAVENOUS

## 2017-06-27 MED ORDER — SODIUM CHLORIDE 0.9% FLUSH
3.0000 mL | Freq: Two times a day (BID) | INTRAVENOUS | Status: DC
  Administered 2017-06-28 – 2017-07-05 (×7): 3 mL via INTRAVENOUS

## 2017-06-27 MED ORDER — PIPERACILLIN-TAZOBACTAM 3.375 G IVPB 30 MIN
3.3750 g | Freq: Once | INTRAVENOUS | Status: AC
  Administered 2017-06-27: 3.375 g via INTRAVENOUS
  Filled 2017-06-27: qty 50

## 2017-06-27 MED ORDER — VANCOMYCIN HCL IN DEXTROSE 1-5 GM/200ML-% IV SOLN
1000.0000 mg | Freq: Once | INTRAVENOUS | Status: AC
  Administered 2017-06-27: 1000 mg via INTRAVENOUS
  Filled 2017-06-27: qty 200

## 2017-06-27 MED ORDER — ONDANSETRON HCL 4 MG/2ML IJ SOLN: 4.0000 mg | Freq: Four times a day (QID) | INTRAMUSCULAR | Status: DC | PRN

## 2017-06-27 MED ORDER — ENOXAPARIN SODIUM 40 MG/0.4ML ~~LOC~~ SOLN: 40.0000 mg | SUBCUTANEOUS | Status: DC

## 2017-06-27 MED ORDER — DEXTROSE 5 % IV SOLN
1.0000 g | Freq: Three times a day (TID) | INTRAVENOUS | Status: DC
  Administered 2017-06-28 – 2017-07-06 (×25): 1 g via INTRAVENOUS
  Filled 2017-06-27 (×29): qty 1

## 2017-06-27 MED ORDER — LORAZEPAM 1 MG PO TABS
1.0000 mg | ORAL_TABLET | ORAL | Status: DC | PRN
  Administered 2017-06-27: 1 mg via ORAL
  Filled 2017-06-27: qty 1

## 2017-06-27 MED ORDER — VANCOMYCIN HCL IN DEXTROSE 1-5 GM/200ML-% IV SOLN: 1000.0000 mg | INTRAVENOUS | Status: DC

## 2017-06-27 MED ORDER — MORPHINE SULFATE (PF) 4 MG/ML IV SOLN
2.0000 mg | INTRAVENOUS | Status: DC | PRN
  Administered 2017-06-29: 2 mg via INTRAVENOUS
  Filled 2017-06-27 (×2): qty 1

## 2017-06-27 MED ORDER — ENOXAPARIN SODIUM 30 MG/0.3ML ~~LOC~~ SOLN
30.0000 mg | SUBCUTANEOUS | Status: DC
  Administered 2017-06-27 – 2017-07-04 (×8): 30 mg via SUBCUTANEOUS
  Filled 2017-06-27 (×9): qty 0.3

## 2017-06-27 MED ORDER — ONDANSETRON HCL 4 MG PO TABS: 4.0000 mg | ORAL_TABLET | Freq: Four times a day (QID) | ORAL | Status: DC | PRN

## 2017-06-27 MED ORDER — LACTATED RINGERS IV SOLN
INTRAVENOUS | Status: DC
  Administered 2017-06-27 – 2017-06-29 (×4): via INTRAVENOUS
  Administered 2017-06-30: 100 mL/h via INTRAVENOUS
  Administered 2017-07-01 – 2017-07-03 (×7): via INTRAVENOUS

## 2017-06-27 NOTE — Progress Notes (Signed)
Pharmacy Antibiotic Note  Marco [name] is a 66 y.o. male admitted on 06/27/2017 with sepsis.  Pharmacy has been consulted for Vamcomycin & Cefepime dosing.  Plan: Vancomycin 1gm IV every 24 hours.  Goal trough 15-20 mcg/mL.  Zosyn 3.375gm x1 in ED, then Cefepime 1gm q8    Temp (24hrs), Avg:100.5 F (38.1 C), Min:100.5 F (38.1 C), Max:100.5 F (38.1 C)   Recent Labs Lab 06/27/17 1535 06/27/17 1543  WBC 18.5*  --   CREATININE  --  0.60*  LATICACIDVEN  --  1.15    CrCl cannot be calculated (Unknown ideal weight.).    No Known Allergies  Antimicrobials this admission: 10/24 Vancomycin >>  10/24 Zosyn x1 in ED 10/24 Cefepime >>   Dose adjustments this admission:  Microbiology results: 10/24 BCx: sent           Urinalysis: ordered, not collected    Thank you for allowing pharmacy to be a part of this patient's care.  Minda Ditto PharmD Pager [phone] 06/27/2017, 8:03 PM

## 2017-06-27 NOTE — Progress Notes (Signed)
[facility] Liaison: RN visit  Patient transported to ED via EMS for hemoptysis and fever. Patient is resting and does not appear to be in distress, VSS. Caregiver, [name], is at bedside. Dr. Alvino Chapel spoke with patient's sister who is also HCPOA regarding code status which will be changed to a DNR. The plan is to do some lab work and administer fluids and medications as indicated and admit to the hospital.   Hospice will continue to follow.  Please call with any hospice related questions.   Thank you,  Farrel Gordon, RN, [facility] Liaison [phone]    All hospital liaisons are on [place].

## 2017-06-27 NOTE — ED Notes (Signed)
Bed: WA21 Expected date:  Expected time:  Means of arrival:  Comments: EMS/FEVER

## 2017-06-27 NOTE — ED Triage Notes (Signed)
He comes to [place] from [facility] with c/o hemoptysis & fever. He arrives drowsy and in no distress, having received ~800cc of IV NSS.

## 2017-06-27 NOTE — ED Provider Notes (Signed)
[facility] DEPT Provider Note   CSN: [id number] Arrival date & time: 06/27/17  1355     History   Chief Complaint Chief Complaint  Patient presents with  . Fever  Level 5 caveat due to dementia and severity of illness. HPI Marco [name] is a 66 y.o. male.  HPI Patient brought in for hemoptysis and fever.  Has had fevers on and off over the last week.  He is on hospice and has a caregiver at home.  Reportedly started coughing up some blood today and they called hospice and was told to come into the ER.  Patient's sister is power of attorney and on the last admission had changed back to full code.  She was in town and saw him 5 days ago.  She lives in the [place] area.  On good days he will speak but is really not ambulatory.  Today has less responsiveness.  A couple days ago had a fever up to 103. Past Medical History:  Diagnosis Date  . DEMENTIA   . Depression   . Developmental disability    developmentaly delayed  . Encounter for antineoplastic chemotherapy 10/10/2016  . Encounter for antineoplastic immunotherapy 04/05/2015; 12/21/2016  . GERD (gastroesophageal reflux disease)   . Hx of radiation therapy 11/07/13-12/24/13   lung,60Gy/57fx  . Hypertension    no medications, no documented history per caregiver at preadmission  . Lung cancer ([place])   . Mental retardation   . Pneumonia 03/28/2017  . Schizophrenia ([place])   . Seizures ([place])    "jerky kind; had them once/year til the last couple years; had 3-4 last year; at least 3 so far this year" (03/28/2017)    Patient Active Problem List   Diagnosis Date Noted  . Lobar pneumonia ([place])   . Hospice care patient   . Seizure disorder ([place])   . Seizure ([place]) 01/14/2017  . Recurrent seizures ([place]) 01/01/2017  . Seizures ([place]) 01/01/2017  . Dysphagia 01/01/2017  . Encounter for antineoplastic immunotherapy 12/21/2016  . Encounter for antineoplastic chemotherapy 10/10/2016  . Hypertension  09/14/2016  . AKI (acute kidney injury) ([place]) 04/23/2016  . Acute on chronic respiratory failure with hypoxemia ([place]) 04/23/2016  . Protein-calorie malnutrition, severe ([place]) 01/28/2016  . Altered mental status   . Post-ictal confusion   . Acute encephalopathy 01/25/2016  . Squamous cell carcinoma of lung ([place]) 01/25/2016  . Seizure disorder ([place]) 01/25/2016  . Seizure ([place]) 08/22/2015  . Malnutrition of moderate degree 07/02/2015  . Sepsis ([place]) 07/01/2015  . UTI (urinary tract infection) 07/01/2015  . ARF (acute renal failure) ([place]) 07/01/2015  . Dementia without behavioral disturbance 07/01/2015  . Leukocytosis 07/01/2015  . Hypokalemia 07/01/2015  . Hypernatremia 07/01/2015  . Convulsions/seizures ([place]) 05/20/2015  . Non-small cell cancer of left lung ([place])   . Acute respiratory failure with hypoxia ([place])   . Goals of care, counseling/discussion 04/05/2015  . Squamous cell lung cancer ([place]) 09/10/2013  . 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 CATARACT EXTRACTION W/PHACO  07/12/2011   Procedure: CATARACT EXTRACTION PHACO AND INTRAOCULAR LENS PLACEMENT (IOC);  Surgeon: Adonis Brook;  Location: [facility];  Service: Ophthalmology;  Laterality: Left;  . EYE SURGERY     L eye  . RADIOLOGY WITH ANESTHESIA N/A 01/17/2017   Procedure: Elvera Bicker BRAIN WITH AND WITHOUT CONTRAST;  Surgeon: Radiologist, Medication, MD;  Location: [facility];  Service: Radiology;  Laterality: N/A;  . VIDEO BRONCHOSCOPY Bilateral 10/01/2013   Procedure: VIDEO BRONCHOSCOPY WITHOUT FLUORO;  Surgeon: Collene Gobble, MD;  Location: [facility];  Service: Cardiopulmonary;  Laterality: Bilateral;       Home Medications    Prior to Admission medications   Medication Sig Start Date End Date Taking? Authorizing Provider  acetaminophen (TYLENOL) 500 MG tablet Take 500 mg by mouth. EVERY 4 TO 6 HOURS AS NEEDED FOR HEADACHE   Yes  [provider]  Dextromethorphan Polistirex (DELSYM PO) Take 5 mLs by mouth every 12 (twelve) hours as needed (COUGHING).   Yes [provider]  diazepam (DIASTAT ACUDIAL) 10 MG GEL Place 5 mg rectally once as needed for seizure.   Yes [provider]  HYDROcodone-homatropine (HYCODAN) 5-1.5 MG/5ML syrup Take 5 mLs by mouth every 6 (six) hours as needed for cough. 06/08/17  Yes Curt Bears, MD  ketoconazole (NIZORAL) 2 % cream Apply 1 application topically 2 (two) times daily as needed for irritation. Pt applies to buttocks.    Yes [provider]  lacosamide (VIMPAT) 200 MG TABS tablet Take 1 tablet (200 mg total) by mouth 2 (two) times daily. 03/08/17  Yes Melvenia Beam, MD  LORazepam (ATIVAN) 1 MG tablet Take 2 tablets (2 mg total) by mouth at bedtime. Patient taking differently: Take 1 mg by mouth every 4 (four) hours.  04/24/16  Yes Johnson, Clanford L, MD  Morphine Sulfate in Dextrose 100-5 MG/100ML-% SOLN Inject 0.25 mLs into the vein. EVERY 4 TO 6 HOURS AS NEEDED FOR PAIN   Yes [provider]  levETIRAcetam (KEPPRA) 750 MG tablet Take 2 tablets (1,500 mg total) by mouth 2 (two) times daily. Patient not taking: Reported on 06/27/2017 04/04/17   Melvenia Beam, MD  loperamide (IMODIUM A-D) 2 MG tablet Take 2 mg by mouth every 4 (four) hours as needed for diarrhea or loose stools.    [provider]  risperiDONE (RISPERDAL) 1 MG tablet Take 1 tablet (1 mg total) by mouth at bedtime. Patient not taking: Reported on 06/27/2017 08/25/15   Theodis Blaze, MD    Family History Family History  Problem Relation Age of Onset  . Hypertension Mother   . Hypertension Father     Social History Social History  Substance Use Topics  . Smoking status: Former Smoker    Packs/day: 0.50    Years: 35.00    Types: Cigarettes    Quit date: 09/07/2013  . Smokeless tobacco: Never Used  . Alcohol use No     Allergies   Patient has no known  allergies.   Review of Systems Review of Systems  Unable to perform ROS: Patient unresponsive     Physical Exam Updated Vital Signs BP (!) 88/51 (BP Location: Right Arm)   Pulse (!) 127   Temp (!) 100.5 F (38.1 C) (Rectal)   Resp (!) 30   SpO2 95%   Physical Exam  Constitutional:  Cachectic  HENT:  Head: Normocephalic.  Eyes: Conjunctivae are normal.  Neck: Neck supple.  Cardiovascular:  Tachycardia  Pulmonary/Chest:  Somewhat harsh breath sounds bilaterally  Abdominal: There is no tenderness.  Musculoskeletal:  Extremities somewhat constricted and wasted  Neurological:  Patient is laying on the side of his eyes closed.  Will open eyes to loud voice and will answer yes or no to some questions but still somewhat somnolent  Skin: Skin is warm. Capillary refill takes less than 2 seconds.     ED  Treatments / Results  Labs (all labs ordered are listed, but only abnormal results are displayed) Labs Reviewed  CBC WITH DIFFERENTIAL/PLATELET - Abnormal; Notable for the following:       Result Value   WBC 18.5 (*)    RBC 3.30 (*)    Hemoglobin 8.5 (*)    HCT 26.7 (*)    MCH 25.8 (*)    RDW 16.7 (*)    Neutro Abs 17.6 (*)    Lymphs Abs 0.3 (*)    All other components within normal limits  I-STAT CHEM 8, ED - Abnormal; Notable for the following:    Creatinine, Ser 0.60 (*)    Glucose, Bld 118 (*)    Calcium, Ion 1.08 (*)    Hemoglobin 9.5 (*)    HCT 28.0 (*)    All other components within normal limits  CULTURE, BLOOD (ROUTINE X 2)  CULTURE, BLOOD (ROUTINE X 2)  URINALYSIS, ROUTINE W REFLEX MICROSCOPIC  I-STAT CG4 LACTIC ACID, ED    EKG  EKG Interpretation None       Radiology Dg Chest Portable 1 View  Result Date: 06/27/2017 CLINICAL DATA:  Shortness of breath and hemoptysis. History of lung cancer. EXAM: PORTABLE CHEST 1 VIEW COMPARISON:  Radiographs 03/28/2017. Chest CT 02/27/2017 and 03/28/2017. FINDINGS: 1527 hour. There is persistent volume loss  within and near complete opacification of the left hemithorax. There is mediastinal shift to the left. The right lung is clear. There is no right-sided pleural effusion or pneumothorax. No acute osseous findings are seen. IMPRESSION: Little change is seen from the prior studies of 3 months ago. There is persistent near complete opacification of the left hemithorax with mediastinal shift to the left secondary to a known centrally obstructing mass and associated pleural fluid. Electronically Signed   By: Richardean Sale M.D.   On: 06/27/2017 15:46    Procedures Procedures (including critical care time)  Medications Ordered in ED Medications  sodium chloride 0.9 % bolus 1,000 mL (not administered)  piperacillin-tazobactam (ZOSYN) IVPB 3.375 g (not administered)  vancomycin (VANCOCIN) IVPB 1000 mg/200 mL premix (not administered)     Initial Impression / Assessment and Plan / ED Course  I have reviewed the triage vital signs and the nursing notes.  Pertinent labs & imaging results that were available during my care of the patient were reviewed by me and considered in my medical decision making (see chart for details).     Patient with fever and mental status changes.  He is on hospice.  I discussed with the patient's sister in the [place] area who has power of attorney.  Decision has been made to change to a back to a DNR.  Patient would not want to be overly aggressive but will currently treat with antibiotics and fluids.  Chest x-ray is worsening of the same findings from before.  Normal lactic acid.  White count is elevated.  Antibiotics empirically started.  [facility] has [facility] the patient in the ER.  The decision will need to be made on overall scope of care since he has a very poor short and long-term prognosis.  Will admit to hospitalist.  Final Clinical Impressions(s) / ED Diagnoses   Final diagnoses:  Fever, unspecified fever cause  Metastatic cancer ([place])  Followed by  palliative care service    New Prescriptions New Prescriptions   No medications on file     Davonna Belling, MD 06/27/17 [phone]

## 2017-06-27 NOTE — Progress Notes (Addendum)
A consult was received from an ED physician for Vancomycin and Zosyn per pharmacy dosing.  The patient's profile has been reviewed for ht/wt/allergies/indication/available labs.    A one time order has already been placed by MD for Vancomycin 1g IV and Zosyn 3.375g IV over 30 minutes.  Further antibiotics/pharmacy consults should be ordered by admitting physician if indicated.                       Thank you, Luiz Ochoa 06/27/2017  3:14 PM

## 2017-06-27 NOTE — H&P (Signed)
History and Physical   Marco [name] MGQ:[id number] DOB: 21-Jun-1951 DOA: 06/27/2017  Referring MD/NP/PA: Dr. Alvino Chapel, EDP PCP: Leamon Arnt, MD  Patient coming from: Home  Chief Complaint: Hemoptysis and fever  HPI: Marco [name] is a 66 y.o. male on home hospice with a history of intellectual disability and terminal lung cancer who presented to the ED for worsening fevers over the past week, up to 103F reportedly. History obtained from caretaker due to pt's inability to communicate. The caretaker reported intermittent, worsening hemoptysis (red blood) developing today. She called hospice and was told by the doctor to bring patient to the hospital. On evaluation he is septic with fever, tachycardia, hypotension. 1L NS provided and CXR demonstrated persistent, marginally worse left hemithorax opacification. WBC 18.5, lactic acid 1.15. Blood cultures drawn, vancomycin and zosyn given, and [facility] called to admit.   On hospitalist evaluation BP is 90/60, HR 115, RR 22. Called pt's sister (HCPOA) to delineate goals of care (as he has been admitted to the hospital multiple times recently, switching from full code to DNR, etc.). She confirms he is DNR/DNI and would not want pressors, but would want IV fluids and antibiotics. She will leave from [place] tomorrow morning and the patient's other sister in [place] is planning to be at the hospital today.   Review of Systems: He has been coughing with intermittent hemoptysis for several weeks. Otherwise unable to obtain due to patient's inability to communicate.   Past Medical History:  Diagnosis Date  . DEMENTIA   . Depression   . Developmental disability    developmentaly delayed  . Encounter for antineoplastic chemotherapy 10/10/2016  . Encounter for antineoplastic immunotherapy 04/05/2015; 12/21/2016  . GERD (gastroesophageal reflux disease)   . Hx of radiation therapy 11/07/13-12/24/13   lung,60Gy/43fx  . Hypertension    no medications, no  documented history per caregiver at preadmission  . Lung cancer ([place])   . Mental retardation   . Pneumonia 03/28/2017  . Schizophrenia ([place])   . Seizures ([place])    "jerky kind; had them once/year til the last couple years; had 3-4 last year; at least 3 so far this year" (03/28/2017)   Past Surgical History:  Procedure Laterality Date  . CATARACT EXTRACTION W/PHACO  07/12/2011   Procedure: CATARACT EXTRACTION PHACO AND INTRAOCULAR LENS PLACEMENT (IOC);  Surgeon: Adonis Brook;  Location: [facility];  Service: Ophthalmology;  Laterality: Left;  . EYE SURGERY     L eye  . RADIOLOGY WITH ANESTHESIA N/A 01/17/2017   Procedure: Elvera Bicker BRAIN WITH AND WITHOUT CONTRAST;  Surgeon: Radiologist, Medication, MD;  Location: [facility];  Service: Radiology;  Laterality: N/A;  . VIDEO BRONCHOSCOPY Bilateral 10/01/2013   Procedure: VIDEO BRONCHOSCOPY WITHOUT FLUORO;  Surgeon: Collene Gobble, MD;  Location: [facility];  Service: Cardiopulmonary;  Laterality: Bilateral;   - Reviewed as below:  reports that he quit smoking about 3 years ago. His smoking use included Cigarettes. He has a 17.50 pack-year smoking history. He has never used smokeless tobacco. He reports that he does not drink alcohol or use drugs. No Known Allergies Family History  Problem Relation Age of Onset  . Hypertension Mother   . Hypertension Father    - Family history otherwise reviewed and not pertinent.  Prior to Admission medications   Medication Sig Start Date End Date Taking? Authorizing Provider  acetaminophen (TYLENOL) 500 MG tablet Take 500 mg by mouth. EVERY 4 TO 6 HOURS AS NEEDED FOR HEADACHE   Yes [provider]  Dextromethorphan Polistirex (DELSYM PO) Take 5 mLs by mouth every 12 (twelve) hours as needed (COUGHING).   Yes [provider]  diazepam (DIASTAT ACUDIAL) 10 MG GEL Place 5 mg rectally once as needed for seizure.   Yes [provider]  HYDROcodone-homatropine (HYCODAN) 5-1.5 MG/5ML syrup Take 5 mLs  by mouth every 6 (six) hours as needed for cough. 06/08/17  Yes Curt Bears, MD  ketoconazole (NIZORAL) 2 % cream Apply 1 application topically 2 (two) times daily as needed for irritation. Pt applies to buttocks.    Yes [provider]  lacosamide (VIMPAT) 200 MG TABS tablet Take 1 tablet (200 mg total) by mouth 2 (two) times daily. 03/08/17  Yes Melvenia Beam, MD  LORazepam (ATIVAN) 1 MG tablet Take 2 tablets (2 mg total) by mouth at bedtime. Patient taking differently: Take 1 mg by mouth every 4 (four) hours.  04/24/16  Yes Johnson, Clanford L, MD  Morphine Sulfate in Dextrose 100-5 MG/100ML-% SOLN Inject 0.25 mLs into the vein. EVERY 4 TO 6 HOURS AS NEEDED FOR PAIN   Yes [provider]  levETIRAcetam (KEPPRA) 750 MG tablet Take 2 tablets (1,500 mg total) by mouth 2 (two) times daily. Patient not taking: Reported on 06/27/2017 04/04/17   Melvenia Beam, MD  loperamide (IMODIUM A-D) 2 MG tablet Take 2 mg by mouth every 4 (four) hours as needed for diarrhea or loose stools.    [provider]  risperiDONE (RISPERDAL) 1 MG tablet Take 1 tablet (1 mg total) by mouth at bedtime. Patient not taking: Reported on 06/27/2017 08/25/15   Theodis Blaze, MD    Physical Exam: Vitals:   06/27/17 1615 06/27/17 1630 06/27/17 1645 06/27/17 1700  BP: (!) 85/56 91/64 93/62  93/61  Pulse: (!) 108 (!) 113 (!) 115 (!) 119  Resp: (!) 21 (!) 23 (!) 22 (!) 26  Temp:      TempSrc:      SpO2: 99% 100% 100% 97%   Constitutional: Chronically ill-appearing, cachectic, diaphoretic male in no acute distress Eyes: Lids and conjunctivae normal, PERRL.  ENMT: Mucous membranes are dry with scant dried blood on lips. Poor dentitioin. Posterior pharynx clear of any exudate or lesions. Neck: normal, supple, no masses, no thyromegaly Respiratory: Mildly labored tachypnea with supplemental oxygen. Nearly absent breath sounds on left with diminished breath sounds on right. No  wheezes. Cardiovascular: Regular tachycardia and active precordium, no murmurs, rubs, or gallops. No carotid bruits. No JVD. No LE edema. 1+ pedal pulses. Abdomen: Normoactive bowel sounds. No tenderness, non-distended, and no masses palpated. No hepatosplenomegaly. Musculoskeletal: No clubbing / cyanosis. No joint deformity upper and lower extremities. Significant diffuse muscle atrophy. Skin: Warm. No rashes, wounds, no ulcers. Neurologic: No focal deficits on exam extremely limited by patient's inability to cooperate. Opens eyes to loud verbal stimuli, localizes to touch. Nonverbal.  Psychiatric: Drowsy but rousable, unable to determine.   Labs on Admission: I have personally reviewed following labs and imaging studies  CBC:  Recent Labs Lab 06/27/17 1535 06/27/17 1543  WBC 18.5*  --   NEUTROABS 17.6*  --   HGB 8.5* 9.5*  HCT 26.7* 28.0*  MCV 80.9  --   PLT 289  --    Basic Metabolic Panel:  Recent Labs Lab 06/27/17 1543  NA 145  K 3.5  CL 107  GLUCOSE 118*  BUN 15  CREATININE 0.60*   Radiological Exams on Admission: Dg Chest Portable 1 View  Result Date:  06/27/2017 CLINICAL DATA:  Shortness of breath and hemoptysis. History of lung cancer. EXAM: PORTABLE CHEST 1 VIEW COMPARISON:  Radiographs 03/28/2017. Chest CT 02/27/2017 and 03/28/2017. FINDINGS: 1527 hour. There is persistent volume loss within and near complete opacification of the left hemithorax. There is mediastinal shift to the left. The right lung is clear. There is no right-sided pleural effusion or pneumothorax. No acute osseous findings are seen. IMPRESSION: Little change is seen from the prior studies of 3 months ago. There is persistent near complete opacification of the left hemithorax with mediastinal shift to the left secondary to a known centrally obstructing mass and associated pleural fluid. Electronically Signed   By: Richardean Sale M.D.   On: 06/27/2017 15:46   Assessment/Plan Principal Problem:    Severe sepsis (HCC) Active Problems:   Dementia without behavioral disturbance   Squamous cell carcinoma of lung (HCC)   Seizure disorder (HCC)   Acute on chronic respiratory failure with hypoxemia (HCC)   Lobar pneumonia ([place])   Hospice care patient   Severe sepsis: Presumably pulmonary source.  - Vanc/cefepime per pharmacy dosing - With ongoing hypotension, will give addition 1L NS bolus then LR. Will need SDU monitoring.  - Confirmed with HCPOA that he is DNR/DNI, would not want pressors. She wants him admitted and given fluids, and is planning on coming home from [place] to see him tomorrow.   Acute on chronic hypoxic respiratory failure due to HCAP and terminal lung cancer: CXR demonstrates ongoing opacification of left hemithorax. Pt's daughter wants him to be made comfortable.  - Oxygen prn - Morphine prn distress/dyspnea/pain - Ativan prn anxiety - Hopeful that once his sister arrives, we can deescalate care. Based on his condition at admission, I mentioned that there is a high likelihood of inpatient death.  Seizure disorder:  - Continue vimpat if able to take po  DVT prophylaxis: Lovenox  Code Status: DNR/DNI  Family Communication: Sister by phone Disposition Plan: Uncertain Consults called: Palliative care  Admission status: Inpatient, SDU.    Vance Gather, MD [facility] Pager [phone]  If 7PM-7AM, please contact night-coverage [email or web address] Password [email or web address] 06/27/2017, 5:02 PM

## 2017-06-28 DIAGNOSIS — L8990 Pressure ulcer of unspecified site, unspecified stage: Secondary | ICD-10-CM | POA: Insufficient documentation

## 2017-06-28 LAB — BASIC METABOLIC PANEL
Anion gap: 8 (ref 5–15)
BUN: 11 mg/dL (ref 6–20)
CALCIUM: 7.6 mg/dL — AB (ref 8.9–10.3)
CO2: 26 mmol/L (ref 22–32)
CREATININE: 0.66 mg/dL (ref 0.61–1.24)
Chloride: 108 mmol/L (ref 101–111)
GFR calc non Af Amer: >60 mL/min (ref >60)
Glucose, Bld: 107 mg/dL — ABNORMAL HIGH (ref 65–99)
Potassium: 3.2 mmol/L — ABNORMAL LOW (ref 3.5–5.1)
Sodium: 142 mmol/L (ref 135–145)

## 2017-06-28 LAB — CBC
HEMATOCRIT: 26.6 % — AB (ref 39.0–52.0)
Hemoglobin: 8.3 g/dL — ABNORMAL LOW (ref 13.0–17.0)
MCH: 25.4 pg — AB (ref 26.0–34.0)
MCHC: 31.2 g/dL (ref 30.0–36.0)
MCV: 81.3 fL (ref 78.0–100.0)
Platelets: 279 10*3/uL (ref 150–400)
RBC: 3.27 MIL/uL — ABNORMAL LOW (ref 4.22–5.81)
RDW: 16.6 % — AB (ref 11.5–15.5)
WBC: 17 10*3/uL — ABNORMAL HIGH (ref 4.0–10.5)

## 2017-06-28 MED ORDER — POTASSIUM CHLORIDE 10 MEQ/100ML IV SOLN
10.0000 meq | INTRAVENOUS | Status: AC
  Administered 2017-06-28 (×6): 10 meq via INTRAVENOUS
  Filled 2017-06-28 (×6): qty 100

## 2017-06-28 MED ORDER — SODIUM CHLORIDE 0.9 % IV BOLUS (SEPSIS)
1000.0000 mL | Freq: Once | INTRAVENOUS | Status: AC
  Administered 2017-06-28: 1000 mL via INTRAVENOUS

## 2017-06-28 MED ORDER — POTASSIUM CHLORIDE CRYS ER 20 MEQ PO TBCR: 30.0000 meq | EXTENDED_RELEASE_TABLET | ORAL | Status: DC

## 2017-06-28 NOTE — Progress Notes (Signed)
[facility] Work note Patient is currently receiving hospice services due to dx of lung cancer. He is cared for at group home and has good support system. His sister is Guardian/HCPOA and rescinded DNR last month as he was showing some good days. [facility] discussed taking pt to ED with group home staff due to code status and symptoms. [facility] will continue to follow during hospital stay and return home. Patient was awake, conversant, yet his speech is garbled and unintelligible at times. He has cough that is treated with medications. Support offered to patient today as no caregivers were present. Plan for return home upon discharge. Melanie A. Gilford Raid, [place] Katherina Right

## 2017-06-28 NOTE — Progress Notes (Signed)
Initial Nutrition Assessment  DOCUMENTATION CODES:   Severe malnutrition in context of chronic illness, Underweight  INTERVENTION:  - Diet advancement as medically feasible. - RD will monitor for GOC/POC.   NUTRITION DIAGNOSIS:   Malnutrition (severe) related to chronic illness, catabolic illness, cancer and cancer related treatments as evidenced by severe depletion of muscle mass, severe depletion of body fat, percent weight loss.   GOAL:   Patient will meet greater than or equal to 90% of their needs  MONITOR:   Diet advancement, Weight trends, Labs, Skin  REASON FOR ASSESSMENT:   Malnutrition Screening Tool  ASSESSMENT:   66 y.o. male on home hospice with a history of intellectual disability and terminal lung cancer who presented to the ED for worsening fevers over the past week, up to 103F reportedly. History obtained from caretaker due to pt's inability to communicate. The caretaker reported intermittent, worsening hemoptysis (red blood) developing today. She called hospice and was told by the doctor to bring patient to the hospital. On evaluation he is septic with fever, tachycardia, hypotension. 1L NS provided and CXR demonstrated persistent, marginally worse left hemithorax opacification.  Pt seen for MST. BMI indicates underweight status. Pt has been NPO since admission. No family/visitors present and pt with incomprehensible speech. Per notes, plan for family to arrive today from out of state and hopeful for further discussions about [place]. Per Dr. Hadley Pen note yesterday, high likelihood of hospital death.  Physical assessment shows severe muscle wasting to temple, all areas of the legs, moderate muscle wasting to shoulder and clavicle areas, severe fat wasting to upper arm. Per chart review, pt has lost 20 lbs (16.7% body weight) in the past 3 months; this is significant for time frame.   Medications reviewed; 10 mEq IV KCl x6 runs today. Labs reviewed; K: 3.2 mmol/L, Ca:  7.6 mg/dL, ionized Ca: 1.08 mmol/L.  IVF: LR @ 100 mL/hr.    Diet Order:  Diet NPO time specified  Skin:  Wound (see comment) (Stage 2 sacral pressure injury)  Last BM:  PTA/unknown  Height:   Ht Readings from Last 1 Encounters:  06/27/17 5\' 6"  (1.676 m)    Weight:   Wt Readings from Last 1 Encounters:  06/28/17 100 lb 12 oz (45.7 kg)    Ideal Body Weight:  64.54 kg  BMI:  Body mass index is 16.26 kg/m.  Estimated Nutritional Needs:   Kcal:  3254-9826 (38-43 kcal/kg)  Protein:  82-91 grams (1.8-2 grams/kg)  Fluid:  >/= 1.7 L/day  EDUCATION NEEDS:   No education needs identified at this time    Jarome Matin, MS, RD, LDN, CNSC Inpatient Clinical Dietitian Pager # [phone] After hours/weekend pager # [phone]

## 2017-06-28 NOTE — Progress Notes (Signed)
[place] [facility] G[facility] RN visit.  This is a related and covered GIP admission of 06/27/17 with [facility] diagnosis of Lung Cancer per Dr. Karie Georges. Patient has [place] DNR. Caregiver activated EMS for symptoms of hemoptysis after contacting [facility] and being advised to take patient to ED for evaluation. Patient was admitted for sepsis with fever, tachycardia and hypotension.   Spoke with Patient at bedside. He is alert and responsive to voice. Unable to determine level of orientation due to impaired speech.  He denies pain and does not appear to be in distress. PIV in right wrist. There are no family of visitors at time of visit. Per notes from [facility], his sister/HCPOA  is to visit today.    Medications:  Lovenox 30mg  injection QD, Lacosamide 200mg  PO BID. Maxipime 1gm/50mg  IVPB Q8hours, Vancomycin 1000mg /240ml IVPB QD, Lactated Ringers infusion @100ml /hr, potassium chloride 69meq/100ml IVPB Q6hrs,  Hycodan 5-1.5mg /24ml syrup PRN cough given at 08:05.   Dr. Karie Georges and Dr. [name] notified of admission. Transfer Summary and medication list placed on shadow chart.   Will continue to follow while hospitalized and anticipate discharge needs.   Thank you  [facility] Liaison  [phone]  All hospital liaisons are on [place].

## 2017-06-28 NOTE — Progress Notes (Signed)
PROGRESS NOTE  Marco [name]  SAY:[id number] DOB: [birth year] DOA: 06/27/2017 PCP: Leamon Arnt, MD  Brief Narrative: Marco [name] is a 66 y.o. male on home hospice with a history of intellectual disability and terminal lung cancer who presented to the ED 10/24 for worsening fevers over the past week, up to 103F with cough and hemoptysis. On evaluation he was septic with fever, tachycardia, hypotension. 1L NS provided and CXR demonstrated persistent, marginally worse left hemithorax opacification. WBC 18.5, lactic acid 1.15. Blood cultures drawn, vancomycin and zosyn given, and pt admitted to SDU after discussion of goals of care with his sister/HCPOA. She confirmed he is DNR/DNI and would not want pressors, but would want IV fluids and antibiotics. Overnight with continued IV fluids vitals have improved and his mentation has stabilized.  Assessment & Plan: Principal Problem:   Severe sepsis ([place]) Active Problems:   Dementia without behavioral disturbance   Squamous cell carcinoma of lung (HCC)   Seizure disorder (HCC)   Acute on chronic respiratory failure with hypoxemia (HCC)   Lobar pneumonia ([place])   Hospice care patient   Pressure injury of skin  Severe sepsis due to pneumonia: Presumed, though XR findings are difficult to interpret with baseline abnormality. - Cotninue cefepime. DC vanc with negative MRSA PCR. - With ongoing hypotension, will give additional 1L NS bolus again today, no evidence of edema. Continue LR @100cc /hr and SDU monitoring.   Acute on chronic hypoxic respiratory failure due to HCAP and terminal lung cancer: CXR demonstrates ongoing opacification of left hemithorax. Pt's daughter wants him to be made comfortable.  - Oxygen prn - Morphine prn distress/dyspnea/pain - Ativan prn anxiety - Continues to be DNR/DNI, but will continue IV antibiotics, SDU monitoring per family wishes. Pt improving.  - Ok to start pureed diet given improvement in mental  status  Seizure disorder:  - Continue vimpat   DVT prophylaxis: Lovenox Code Status: DNR/DNI Family Communication: Sisters, including HCPOA, at bedside Disposition Plan: Continue SDU care.  Consultants:   Hospice  Procedures:   None  Antimicrobials:  Vancomycin 10/24-10/25  Cefepime 10/24 >>    Subjective: Pt more alert, denies any pain or trouble breathing. He is hungry. Had some delirium last night per RN report. UOP improving.   Objective: Vitals:   06/28/17 1200 06/28/17 1300 06/28/17 1400 06/28/17 1500  BP: (!) 92/59 100/66 93/77 118/63  Pulse: (!) 101 (!) 106 (!) 108 (!) 110  Resp: (!) 30 (!) 28 (!) 24 19  Temp: 97.8 F (36.6 C)     TempSrc: Oral     SpO2: 99% 99% 99% 98%  Weight:      Height:        Intake/Output Summary (Last 24 hours) at 06/28/17 1552 Last data filed at 06/28/17 1343  Gross per 24 hour  Intake             2150 ml  Output             2000 ml  Net              150 ml   Filed Weights   06/27/17 1939 06/28/17 0356  Weight: 45.1 kg (99 lb 6.8 oz) 45.7 kg (100 lb 12 oz)    Gen: 66 y.o. male in no distress HEENT: Poor dentition, no ulcerations/lacerations in oropharynx/mouth.  Pulm: Non-labored tachypnea breathing room air. Nearly absent breath sounds on left.  CV: Regular tachycardia. No murmur, rub, or gallop. No JVD, no pedal edema.  GI: Abdomen soft, non-tender, non-distended, with normoactive bowel sounds. No organomegaly or masses felt. Ext: Warm, no deformities Skin: No rashes, lesions no ulcers Neuro: Alert, cognitive impairment noted without focal neurological deficits. Psych: Judgement and insight appear impaired. Mood & affect appropriate.   Data Reviewed: I have personally reviewed following labs and imaging studies  CBC:  Recent Labs Lab 06/27/17 1535 06/27/17 1543 06/28/17 0353  WBC 18.5*  --  17.0*  NEUTROABS 17.6*  --   --   HGB 8.5* 9.5* 8.3*  HCT 26.7* 28.0* 26.6*  MCV 80.9  --  81.3  PLT 289  --  409    Basic Metabolic Panel:  Recent Labs Lab 06/27/17 1543 06/28/17 0353  NA 145 142  K 3.5 3.2*  CL 107 108  CO2  --  26  GLUCOSE 118* 107*  BUN 15 11  CREATININE 0.60* 0.66  CALCIUM  --  7.6*   GFR: Estimated Creatinine Clearance: 57.1 mL/min (by C-G formula based on SCr of 0.66 mg/dL). Liver Function Tests: No results for input(s): AST, ALT, ALKPHOS, BILITOT, PROT, ALBUMIN in the last 168 hours. No results for input(s): LIPASE, AMYLASE in the last 168 hours. No results for input(s): AMMONIA in the last 168 hours. Coagulation Profile: No results for input(s): INR, PROTIME in the last 168 hours. Cardiac Enzymes: No results for input(s): CKTOTAL, CKMB, CKMBINDEX, TROPONINI in the last 168 hours. BNP (last 3 results) No results for input(s): PROBNP in the last 8760 hours. HbA1C: No results for input(s): HGBA1C in the last 72 hours. CBG: No results for input(s): GLUCAP in the last 168 hours. Lipid Profile: No results for input(s): CHOL, HDL, LDLCALC, TRIG, CHOLHDL, LDLDIRECT in the last 72 hours. Thyroid Function Tests: No results for input(s): TSH, T4TOTAL, FREET4, T3FREE, THYROIDAB in the last 72 hours. Anemia Panel: No results for input(s): VITAMINB12, FOLATE, FERRITIN, TIBC, IRON, RETICCTPCT in the last 72 hours. Urine analysis:    Component Value Date/Time   COLORURINE YELLOW 06/27/2017 1506   APPEARANCEUR CLEAR 06/27/2017 1506   LABSPEC 1.019 06/27/2017 1506   LABSPEC 1.020 03/16/2015 1520   PHURINE 5.0 06/27/2017 1506   GLUCOSEU NEGATIVE 06/27/2017 1506   GLUCOSEU Negative 03/16/2015 1520   HGBUR NEGATIVE 06/27/2017 1506   BILIRUBINUR NEGATIVE 06/27/2017 1506   BILIRUBINUR Negative 03/16/2015 1520   KETONESUR NEGATIVE 06/27/2017 1506   PROTEINUR NEGATIVE 06/27/2017 1506   UROBILINOGEN 0.2 06/27/2015 1543   UROBILINOGEN 0.2 03/16/2015 1520   NITRITE NEGATIVE 06/27/2017 1506   LEUKOCYTESUR NEGATIVE 06/27/2017 1506   LEUKOCYTESUR Negative 03/16/2015 1520    Recent Results (from the past 240 hour(s))  Culture, blood (routine x 2)     Status: None (Preliminary result)   Collection Time: 06/27/17  3:34 PM  Result Value Ref Range Status   Specimen Description BLOOD LEFT WRIST  Final   Special Requests   Final    BOTTLES DRAWN AEROBIC AND ANAEROBIC Blood Culture adequate volume   Culture   Final    NO GROWTH < 24 HOURS Performed at [facility], [place] [address], [place], [place] [address]    Report Status PENDING  Incomplete  Culture, blood (routine x 2)     Status: None (Preliminary result)   Collection Time: 06/27/17  3:45 PM  Result Value Ref Range Status   Specimen Description BLOOD LEFT FOREARM  Final   Special Requests   Final    BOTTLES DRAWN AEROBIC AND ANAEROBIC Blood Culture results may not be optimal due  to an inadequate volume of blood received in culture bottles   Culture   Final    NO GROWTH < 24 HOURS Performed at [facility] [address] [place],  [address]    Report Status PENDING  Incomplete  MRSA PCR Screening     Status: None   Collection Time: 06/27/17  6:50 PM  Result Value Ref Range Status   MRSA by PCR NEGATIVE NEGATIVE Final    Comment:        The GeneXpert MRSA Assay (FDA approved for NASAL specimens only), is one component of a comprehensive MRSA colonization surveillance program. It is not intended to diagnose MRSA infection nor to guide or monitor treatment for MRSA infections.       Radiology Studies: Dg Chest Portable 1 View  Result Date: 06/27/2017 CLINICAL DATA:  Shortness of breath and hemoptysis. History of lung cancer. EXAM: PORTABLE CHEST 1 VIEW COMPARISON:  Radiographs 03/28/2017. Chest CT 02/27/2017 and 03/28/2017. FINDINGS: 1527 hour. There is persistent volume loss within and near complete opacification of the left hemithorax. There is mediastinal shift to the left. The right lung is clear. There is no right-sided pleural effusion or pneumothorax. No acute osseous  findings are seen. IMPRESSION: Little change is seen from the prior studies of 3 months ago. There is persistent near complete opacification of the left hemithorax with mediastinal shift to the left secondary to a known centrally obstructing mass and associated pleural fluid. Electronically Signed   By: Richardean Sale M.D.   On: 06/27/2017 15:46    Scheduled Meds: . enoxaparin (LOVENOX) injection  30 mg Subcutaneous Q24H  . lacosamide  200 mg Oral BID  . sodium chloride flush  3 mL Intravenous Q12H   Continuous Infusions: . ceFEPime (MAXIPIME) IV Stopped (06/28/17 1020)  . lactated ringers 100 mL/hr at 06/28/17 0635  . vancomycin       LOS: 1 day   Time spent: 35 minutes.  Vance Gather, MD [facility] Pager [phone]  If 7PM-7AM, please contact night-coverage [email or web address] Password [email or web address] 06/28/2017, 3:52 PM

## 2017-06-28 NOTE — Progress Notes (Signed)
Palliative Medicine RN Note: Spoke to both Amy and Haynes Dage with [facility]. This is a GIP, related admission per discussion w them. [facility] does not typically see GIP patients for GOC, as their team leads those discussions. Per my conversations with [facility] nurses, they will make contact with family. Amy reports that [facility] feels goals are very clear.  Please contact our office with any questions.  Marjie Skiff Audryana Hockenberry, RN, BSN, [facility] 06/28/2017 9:12 AM Cell [phone] 8:00-4:00 Monday-Friday Office [phone]

## 2017-06-28 NOTE — Care Management Note (Signed)
Case Management Note  Patient Details  Name: Marco [name] MRN: [id number] Date of Birth: [birth year]  Subjective/Objective:                   66 y.o. male on home hospice with a history of intellectual disability and terminal lung cancer who presented to the ED for worsening fevers over the past week, up to 103F reportedly. History obtained from caretaker due to pt's inability to communicate. The caretaker reported intermittent, worsening hemoptysis (red blood) developing today. She called hospice and was told by the doctor to bring patient to the hospital. On evaluation he is septic with fever, tachycardia, hypotension. 1L NS provided and CXR demonstrated persistent, marginally worse left hemithorax opacification. WBC 18.5, lactic acid 1.15. Blood cultures drawn, vancomycin and zosyn given, and [facility] called to admit.   On hospitalist evaluation BP is 90/60, HR 115, RR 22. Called pt's sister (HCPOA) to delineate goals of care (as he has been admitted to the hospital multiple times recently, switching from full code to DNR, etc.). She confirms he is DNR/DNI and would not want pressors, but would want IV fluids and antibiotics. She will leave from [place] tomorrow morning and the patient's other sister in [place] is planning to be at the hospital today.   Action/Plan: Date:  June 28, 2017 Chart reviewed for concurrent status and case management needs.  Will continue to follow patient progress.  Discharge Planning: following for needs  Expected discharge date: [id number]  Velva Harman, BSN, [place], [place]  Expected Discharge Date:   (unknown)               Expected Discharge Plan:  Home w Hospice Care  In-House Referral:     Discharge planning Services     Post Acute Care Choice:    Choice offered to:     DME Arranged:    DME Agency:     HH Arranged:    [place] Agency:     Status of Service:  In process, will continue to follow  If discussed at Long Length of Stay Meetings,  dates discussed:    Additional Comments:  Leeroy Cha, RN 06/28/2017, 8:42 AM

## 2017-06-28 NOTE — Progress Notes (Signed)
[facility] ADULT ICU REPLACEMENT PROTOCOL FOR AM LAB REPLACEMENT ONLY  The patient does apply for the [facility] Adult ICU Electrolyte Replacment Protocol based on the criteria listed below:   1. Is GFR >/= 40 ml/min? Yes.    Patient's GFR today is>60 2. Is urine output >/= 0.5 ml/kg/hr for the last 6 hours? Yes.   Patient's UOP is 2.1 ml/kg/hr 3. Is BUN < 60 mg/dL? Yes.    Patient's BUN today i11 4. Abnormal electrolyte(s): K+=3.2 5. Ordered repletion with: per protocol 6. If a panic level lab has been reported, has the CCM MD in charge been notified? Yes.  .   Physician:  Erroll Luna 06/28/2017 6:01 AM

## 2017-06-29 LAB — CBC
HEMATOCRIT: 24.2 % — AB (ref 39.0–52.0)
Hemoglobin: 7.6 g/dL — ABNORMAL LOW (ref 13.0–17.0)
MCH: 24.9 pg — ABNORMAL LOW (ref 26.0–34.0)
MCHC: 31.4 g/dL (ref 30.0–36.0)
MCV: 79.3 fL (ref 78.0–100.0)
Platelets: 268 10*3/uL (ref 150–400)
RBC: 3.05 MIL/uL — AB (ref 4.22–5.81)
RDW: 16.8 % — ABNORMAL HIGH (ref 11.5–15.5)
WBC: 17.3 10*3/uL — AB (ref 4.0–10.5)

## 2017-06-29 LAB — BASIC METABOLIC PANEL
Anion gap: 7 (ref 5–15)
BUN: 11 mg/dL (ref 6–20)
CHLORIDE: 108 mmol/L (ref 101–111)
CO2: 25 mmol/L (ref 22–32)
Calcium: 7.6 mg/dL — ABNORMAL LOW (ref 8.9–10.3)
Creatinine, Ser: 0.53 mg/dL — ABNORMAL LOW (ref 0.61–1.24)
GFR calc non Af Amer: >60 mL/min (ref >60)
Glucose, Bld: 114 mg/dL — ABNORMAL HIGH (ref 65–99)
POTASSIUM: 3.3 mmol/L — AB (ref 3.5–5.1)
SODIUM: 140 mmol/L (ref 135–145)

## 2017-06-29 MED ORDER — LORAZEPAM 1 MG PO TABS
2.0000 mg | ORAL_TABLET | ORAL | Status: DC | PRN
  Administered 2017-06-29: 2 mg via ORAL
  Filled 2017-06-29: qty 2

## 2017-06-29 MED ORDER — LORAZEPAM 2 MG/ML IJ SOLN
2.0000 mg | INTRAMUSCULAR | Status: DC | PRN
  Administered 2017-06-30 – 2017-07-03 (×5): 2 mg via INTRAVENOUS
  Filled 2017-06-29 (×6): qty 1

## 2017-06-29 NOTE — NC FL2 (Signed)
[place] LEVEL OF CARE SCREENING TOOL     IDENTIFICATION  Patient Name: Marco [name] Birthdate: May 01, 1951 Sex: male Admission Date (Current Location): 06/27/2017  [facility] and [place] Number:  Herbalist and Address:  [facility],  [place] [address] [place]      Provider Number: [id number]  Attending Physician Name and Address:  Caren Griffins, MD  Relative Name and Phone Number:       Current Level of Care: Hospital Recommended Level of Care: Other (Comment) (Group Home) Prior Approval Number:    Date Approved/Denied:   PASRR Number:    Discharge Plan:  (Group Home)    Current Diagnoses: Patient Active Problem List   Diagnosis Date Noted  . Pressure injury of skin 06/28/2017  . Severe sepsis ([place]) 06/27/2017  . Lobar pneumonia ([place])   . Hospice care patient   . Seizure disorder ([place])   . Seizure ([place]) 01/14/2017  . Recurrent seizures ([place]) 01/01/2017  . Seizures ([place]) 01/01/2017  . Dysphagia 01/01/2017  . Encounter for antineoplastic immunotherapy 12/21/2016  . Encounter for antineoplastic chemotherapy 10/10/2016  . Hypertension 09/14/2016  . AKI (acute kidney injury) ([place]) 04/23/2016  . Acute on chronic respiratory failure with hypoxemia ([place]) 04/23/2016  . Protein-calorie malnutrition, severe ([place]) 01/28/2016  . Altered mental status   . Post-ictal confusion   . Acute encephalopathy 01/25/2016  . Squamous cell carcinoma of lung ([place]) 01/25/2016  . Seizure disorder ([place]) 01/25/2016  . Seizure ([place]) 08/22/2015  . Malnutrition of moderate degree 07/02/2015  . Sepsis ([place]) 07/01/2015  . UTI (urinary tract infection) 07/01/2015  . ARF (acute renal failure) ([place]) 07/01/2015  . Dementia without behavioral disturbance 07/01/2015  . Leukocytosis 07/01/2015  . Hypokalemia 07/01/2015  . Hypernatremia 07/01/2015  . Convulsions/seizures ([place]) 05/20/2015  . 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 Acute  respiratory failure with hypoxia ([place])   . Goals of care, counseling/discussion 04/05/2015  . Squamous cell lung cancer ([place]) 09/10/2013  . Severe intellectual disability with intelligence quotient 20 to 34 10/29/2012    Orientation RESPIRATION BLADDER Height & Weight     Self  Normal Incontinent, External catheter Weight: 100 lb 12 oz (45.7 kg) Height:  5\' 6"  (167.6 cm)  BEHAVIORAL SYMPTOMS/MOOD NEUROLOGICAL BOWEL NUTRITION STATUS      Incontinent Diet (Dysphagia 1)  AMBULATORY STATUS COMMUNICATION OF NEEDS Skin   Total Care Does not communicate PU Stage and Appropriate Care   PU Stage 2 Dressing:  (Partial Thickenss loss of dermis, shallow open ulcer red, pink w/ slough)                   Personal Care Assistance Level of Assistance  Bathing, Feeding, Dressing Bathing Assistance: Maximum assistance Feeding assistance: Limited assistance Dressing Assistance: Maximum assistance     Functional Limitations Info  Sight, Hearing, Speech Sight Info: Adequate Hearing Info: Adequate Speech Info: Impaired    SPECIAL CARE FACTORS FREQUENCY                       Contractures Contractures Info: Present    Additional Factors Info  Code Status, Allergies Code Status Info: DNR Allergies Info: No Known Allergies           Current Medications (06/29/2017):  This is the current hospital active medication list Current Facility-Administered Medications  Medication Dose Route Frequency Provider Last Rate Last Dose  . ceFEPIme (MAXIPIME) 1 g in dextrose  5 % 50 mL IVPB  1 g Intravenous Q8H Green, Terri L, RPH   Stopped at 06/29/17 0930  . enoxaparin (LOVENOX) injection 30 mg  30 mg Subcutaneous Q24H Minda Ditto, RPH   30 mg at 06/28/17 2110  . HYDROcodone-homatropine (HYCODAN) 5-1.5 MG/5ML syrup 5 mL  5 mL Oral Q6H PRN Patrecia Pour, MD   5 mL at 06/28/17 0805  . lacosamide (VIMPAT) tablet 200 mg  200 mg Oral BID Vance Gather B, MD   200 mg at 06/29/17 1146  . lactated  ringers infusion   Intravenous Continuous Patrecia Pour, MD 100 mL/hr at 06/29/17 0830    . LORazepam (ATIVAN) tablet 1 mg  1 mg Oral Q4H PRN Patrecia Pour, MD   1 mg at 06/27/17 2124  . morphine 4 MG/ML injection 2 mg  2 mg Intravenous Q1H PRN Patrecia Pour, MD   2 mg at 06/29/17 0006  . ondansetron (ZOFRAN) tablet 4 mg  4 mg Oral Q6H PRN Patrecia Pour, MD       Or  . ondansetron [facility]) injection 4 mg  4 mg Intravenous Q6H PRN Vance Gather B, MD      . sodium chloride flush (NS) 0.9 % injection 3 mL  3 mL Intravenous Q12H Patrecia Pour, MD   3 mL at 06/28/17 2110     Discharge Medications: Please see discharge summary for a list of discharge medications.  Relevant Imaging Results:  Relevant Lab Results:   Additional Information ss#: [id number]  Lia Hopping, LCSW

## 2017-06-29 NOTE — Progress Notes (Signed)
[place] [facility] GIP RN visit.  This is a related and covered GIP admission of 06/27/17 with [facility] diagnosis of Lung Cancer per Dr. Karie Georges. Patient has [place] DNR. Caregiver activated EMS for symptoms of hemoptysis after contacting [facility] and being advised to take patient to ED for evaluation. Patient was admitted for sepsis with fever, tachycardia and hypotension.   Spoke with Patient at bedside. He is alert and responsive to voice. He denies pain and appears comfortable. His speech is clearer today. He has a  foley with clear amber urine.  PIV in right wrist. There are no family of visitors at time of visit.  Medications:  Lovenox 30mg  injection QD, Lacosamide 200mg  PO BID. Maxipime 1gm/50mg  IVPB Q8hours, Lactated Ringers infusion @100ml /hr. Morphine 2mg  IV PRN given at 00:06.   Dr. Karie Georges and Dr. [name] notified of admission. Transfer Summary and medication list placed on shadow chart.   Will continue to follow while hospitalized and anticipate discharge needs.   Thank you  [facility] Liaison  [phone]  All hospital liaisons are on [place].

## 2017-06-29 NOTE — Progress Notes (Signed)
PROGRESS NOTE  [name]  JAS:[id number] DOB: 1951/02/10 DOA: 06/27/2017 PCP: Leamon Arnt, MD  Brief Narrative: Marco [name] is a 66 y.o. male on home hospice with a history of intellectual disability and terminal lung cancer who presented to the ED 10/24 for worsening fevers over the past week, up to 103F with cough and hemoptysis. On evaluation he was septic with fever, tachycardia, hypotension. 1L NS provided and CXR demonstrated persistent, marginally worse left hemithorax opacification. WBC 18.5, lactic acid 1.15. Blood cultures drawn, vancomycin and zosyn given, and pt admitted to SDU after discussion of goals of care with his sister/HCPOA. She confirmed he is DNR/DNI and would not want pressors, but would want IV fluids and antibiotics. Overnight with continued IV fluids vitals have improved and his mentation has stabilized.  Assessment & Plan: Principal Problem:   Severe sepsis ([place]) Active Problems:   Dementia without behavioral disturbance   Squamous cell carcinoma of lung (HCC)   Seizure disorder (HCC)   Acute on chronic respiratory failure with hypoxemia (HCC)   Lobar pneumonia ([place])   Hospice care patient   Pressure injury of skin  Severe sepsis due to pneumonia -Presumed, though XR findings are difficult to interpret with baseline abnormality. -Continue cefepime. DC vanc with negative MRSA PCR. -ongoing hypotension, however improving, keep in stepdown today  Acute on chronic hypoxic respiratory failure due to HCAP and terminal lung cancer -CXR demonstrates ongoing opacification of left hemithorax.  -Oxygen prn -Morphine prn distress/dyspnea/pain -Ativan prn anxiety -Continues to be DNR/DNI, but will continue IV antibiotics, SDU monitoring per family wishes. Pt improving.  -Ok to start pureed diet given improvement in mental status  Seizure disorder -Continue vimpat    DVT prophylaxis: Lovenox Code Status: DNR/DNI Family Communication: Sister Education officer, community)  at bedside Disposition Plan: Continue SDU care.  Consultants:   Hospice  Procedures:   None  Antimicrobials:  Vancomycin 10/24-10/25  Cefepime 10/24 >>    Subjective: -Minimally communicative, but no complaints  Objective: Vitals:   06/29/17 0500 06/29/17 0600 06/29/17 0619 06/29/17 0800  BP: 106/64 (!) 94/49    Pulse: (!) 108 100 92   Resp: (!) 21 (!) 21 20   Temp:    98.4 F (36.9 C)  TempSrc:    Oral  SpO2: 93% 100% 100%   Weight:      Height:        Intake/Output Summary (Last 24 hours) at 06/29/17 1010 Last data filed at 06/29/17 0800  Gross per 24 hour  Intake             2760 ml  Output             2050 ml  Net              710 ml   Filed Weights   06/27/17 1939 06/28/17 0356  Weight: 45.1 kg (99 lb 6.8 oz) 45.7 kg (100 lb 12 oz)    Gen:  No apparent distress HEENT: No scleral icterus, poor dentition Pulm: Decreased breath sounds on the left, no wheezing, no crackles CV: Regular rate and rhythm, no murmurs.  No edema GI: Soft, nontender, bowel sounds positive Skin: No rashes Neuro: Alert, however minimally communicative  Data Reviewed: I have personally reviewed following labs and imaging studies  CBC:  Recent Labs Lab 06/27/17 1535 06/27/17 1543 06/28/17 0353 06/29/17 0326  WBC 18.5*  --  17.0* 17.3*  NEUTROABS 17.6*  --   --   --   HGB  8.5* 9.5* 8.3* 7.6*  HCT 26.7* 28.0* 26.6* 24.2*  MCV 80.9  --  81.3 79.3  PLT 289  --  279 811   Basic Metabolic Panel:  Recent Labs Lab 06/27/17 1543 06/28/17 0353 06/29/17 0326  NA 145 142 140  K 3.5 3.2* 3.3*  CL 107 108 108  CO2  --  26 25  GLUCOSE 118* 107* 114*  BUN 15 11 11   CREATININE 0.60* 0.66 0.53*  CALCIUM  --  7.6* 7.6*   GFR: Estimated Creatinine Clearance: 57.1 mL/min (A) (by C-G formula based on SCr of 0.53 mg/dL (L)). Liver Function Tests: No results for input(s): AST, ALT, ALKPHOS, BILITOT, PROT, ALBUMIN in the last 168 hours. No results for input(s): LIPASE, AMYLASE  in the last 168 hours. No results for input(s): AMMONIA in the last 168 hours. Coagulation Profile: No results for input(s): INR, PROTIME in the last 168 hours. Cardiac Enzymes: No results for input(s): CKTOTAL, CKMB, CKMBINDEX, TROPONINI in the last 168 hours. BNP (last 3 results) No results for input(s): PROBNP in the last 8760 hours. HbA1C: No results for input(s): HGBA1C in the last 72 hours. CBG: No results for input(s): GLUCAP in the last 168 hours. Lipid Profile: No results for input(s): CHOL, HDL, LDLCALC, TRIG, CHOLHDL, LDLDIRECT in the last 72 hours. Thyroid Function Tests: No results for input(s): TSH, T4TOTAL, FREET4, T3FREE, THYROIDAB in the last 72 hours. Anemia Panel: No results for input(s): VITAMINB12, FOLATE, FERRITIN, TIBC, IRON, RETICCTPCT in the last 72 hours. Urine analysis:    Component Value Date/Time   COLORURINE YELLOW 06/27/2017 1506   APPEARANCEUR CLEAR 06/27/2017 1506   LABSPEC 1.019 06/27/2017 1506   LABSPEC 1.020 03/16/2015 1520   PHURINE 5.0 06/27/2017 1506   GLUCOSEU NEGATIVE 06/27/2017 1506   GLUCOSEU Negative 03/16/2015 1520   HGBUR NEGATIVE 06/27/2017 1506   BILIRUBINUR NEGATIVE 06/27/2017 1506   BILIRUBINUR Negative 03/16/2015 1520   KETONESUR NEGATIVE 06/27/2017 1506   PROTEINUR NEGATIVE 06/27/2017 1506   UROBILINOGEN 0.2 06/27/2015 1543   UROBILINOGEN 0.2 03/16/2015 1520   NITRITE NEGATIVE 06/27/2017 1506   LEUKOCYTESUR NEGATIVE 06/27/2017 1506   LEUKOCYTESUR Negative 03/16/2015 1520   Recent Results (from the past 240 hour(s))  Culture, blood (routine x 2)     Status: None (Preliminary result)   Collection Time: 06/27/17  3:34 PM  Result Value Ref Range Status   Specimen Description BLOOD LEFT WRIST  Final   Special Requests   Final    BOTTLES DRAWN AEROBIC AND ANAEROBIC Blood Culture adequate volume   Culture   Final    NO GROWTH < 24 HOURS Performed at [facility], [place] [address], [place], [place] [address]    Report  Status PENDING  Incomplete  Culture, blood (routine x 2)     Status: None (Preliminary result)   Collection Time: 06/27/17  3:45 PM  Result Value Ref Range Status   Specimen Description BLOOD LEFT FOREARM  Final   Special Requests   Final    BOTTLES DRAWN AEROBIC AND ANAEROBIC Blood Culture results may not be optimal due to an inadequate volume of blood received in culture bottles   Culture   Final    NO GROWTH < 24 HOURS Performed at [facility], [place] [address] [place], [place] [address]    Report Status PENDING  Incomplete  MRSA PCR Screening     Status: None   Collection Time: 06/27/17  6:50 PM  Result Value Ref Range Status  MRSA by PCR NEGATIVE NEGATIVE Final    Comment:        The GeneXpert MRSA Assay (FDA approved for NASAL specimens only), is one component of a comprehensive MRSA colonization surveillance program. It is not intended to diagnose MRSA infection nor to guide or monitor treatment for MRSA infections.       Radiology Studies: Dg Chest Portable 1 View  Result Date: 06/27/2017 CLINICAL DATA:  Shortness of breath and hemoptysis. History of lung cancer. EXAM: PORTABLE CHEST 1 VIEW COMPARISON:  Radiographs 03/28/2017. Chest CT 02/27/2017 and 03/28/2017. FINDINGS: 1527 hour. There is persistent volume loss within and near complete opacification of the left hemithorax. There is mediastinal shift to the left. The right lung is clear. There is no right-sided pleural effusion or pneumothorax. No acute osseous findings are seen. IMPRESSION: Little change is seen from the prior studies of 3 months ago. There is persistent near complete opacification of the left hemithorax with mediastinal shift to the left secondary to a known centrally obstructing mass and associated pleural fluid. Electronically Signed   By: Richardean Sale M.D.   On: 06/27/2017 15:46    Scheduled Meds: . enoxaparin (LOVENOX) injection  30 mg Subcutaneous Q24H  . lacosamide  200 mg Oral BID    . sodium chloride flush  3 mL Intravenous Q12H   Continuous Infusions: . ceFEPime (MAXIPIME) IV 1 g (06/29/17 0900)  . lactated ringers 100 mL/hr at 06/29/17 0830     LOS: 2 days   Felton Buczynski M. Cruzita Lederer, MD [facility] [phone]  If 7PM-7AM, please contact night-coverage [email or web address] Password [email or web address]   06/29/2017, 10:10 AM

## 2017-06-29 NOTE — Progress Notes (Signed)
Pharmacy Antibiotic Note  Marco [name] is a 65 y.o. male admitted on 06/27/2017 with sepsis.  Pharmacy has been consulted for Vamcomycin & Cefepime dosing.  Vancomycin has been stopped with neg MRSA PCR and Bcx = NGTD  Today, 06/29/2017 Day # 2 abx   WBC elevated, unchanged  Plan: - Cefepime 1gm IV q8h - Narrow and/or change to PO as clinically indicated  Height: 5\' 6"  (167.6 cm) Weight: 100 lb 12 oz (45.7 kg) IBW/kg (Calculated) : 63.8  Temp (24hrs), Avg:98.6 F (37 C), Min:97.8 F (36.6 C), Max:99.7 F (37.6 C)   Recent Labs Lab 06/27/17 1535 06/27/17 1543 06/28/17 0353 06/29/17 0326  WBC 18.5*  --  17.0* 17.3*  CREATININE  --  0.60* 0.66 0.53*  LATICACIDVEN  --  1.15  --   --     Estimated Creatinine Clearance: 57.1 mL/min (A) (by C-G formula based on SCr of 0.53 mg/dL (L)).    No Known Allergies  Antimicrobials this admission: 10/24 Vancomycin >> 10/25 10/24 Cefepime >>   Dose adjustments this admission:  Microbiology results: 10/24 BCx: NGTD 10/24 MRSA PCR: neg Thank you for allowing pharmacy to be a part of this patient's care.  Doreene Eland, PharmD, BCPS.   Pager: [phone] 06/29/2017 8:41 AM

## 2017-06-29 NOTE — Clinical Social Work Note (Signed)
Clinical Social Work Assessment  Patient Details  Name: Marco [name] MRN: [id number] Date of Birth: [birth year]  Date of referral:  06/29/17               Reason for consult:  Facility Placement (Patient from [facility])                Permission sought to share information with:  Facility Art therapist granted to share information::     Name::        Agency::     Relationship::   Sister Marco [name] & Caregiver Marco [name] Information:     Housing/Transportation Living arrangements for the past 2 months:  [place] of Information:  Other (Comment Required) (Caregiver Scientist, research (physical sciences)) Patient Interpreter Needed:  None Criminal Activity/Legal Involvement Pertinent to Current Situation/Hospitalization:  No - Comment as needed Significant Relationships:  Warehouse manager, Other Family Members Lives with:  Other (Comment) (Group Home) Do you feel safe going back to the place where you live?  Yes Need for family participation in patient care:  Yes (Patient cannot communicate)  Care giving concerns:  Patient has intellectual disability and terminal lung cancer.  Patient admitted for Hemoptysis and fever.  Social Worker assessment / plan: CSW met with patient, sister and caretaker at bedside. Patient  Marco [name] is HCPOA. CSW talked with patient sister and caregiver due to patients inability to communicate. Patient has been living at [facility] for ten years receiving care. The patient is currently receiving [facility] Hospice services at the [facility].  At baseline the patient needs maximum assistance with all ADL's    Plan: Patient to return to [facility] at discharge.  Patient may need EMS transport if Caregiver cannot transport.    Employment status:  Disabled (Comment on whether or not currently receiving Disability) Insurance information:  Medicare PT Recommendations:  Not assessed at this time Information / Referral to community resources:      Patient/Family's Response to care:  Agreeable and Responding well to care.   Patient/Family's Understanding of and Emotional Response to Diagnosis, Current Treatment, and Prognosis:  Patient sister reports, "He just keeps in fighting." Patient caregiver and sister very involved in patient care.   Emotional Assessment Appearance:    Attitude/Demeanor/Rapport:    Affect (typically observed):  Calm, Pleasant Orientation:  Oriented to Self Alcohol / Substance use:  Not Applicable Psych involvement (Current and /or in the community):  No (Comment)  Discharge Needs  Concerns to be addressed:  Discharge Planning Concerns Readmission within the last 30 days:  No Current discharge risk:  None Barriers to Discharge:  No Barriers Identified   Marco Hopping, LCSW 06/29/2017, 12:18 PM

## 2017-06-30 LAB — CBC
HEMATOCRIT: 25.8 % — AB (ref 39.0–52.0)
HEMOGLOBIN: 8 g/dL — AB (ref 13.0–17.0)
MCH: 24.5 pg — AB (ref 26.0–34.0)
MCHC: 31 g/dL (ref 30.0–36.0)
MCV: 78.9 fL (ref 78.0–100.0)
Platelets: 313 10*3/uL (ref 150–400)
RBC: 3.27 MIL/uL — ABNORMAL LOW (ref 4.22–5.81)
RDW: 17 % — AB (ref 11.5–15.5)
WBC: 15.6 10*3/uL — ABNORMAL HIGH (ref 4.0–10.5)

## 2017-06-30 LAB — BASIC METABOLIC PANEL
ANION GAP: 6 (ref 5–15)
BUN: 11 mg/dL (ref 6–20)
CO2: 28 mmol/L (ref 22–32)
Calcium: 7.6 mg/dL — ABNORMAL LOW (ref 8.9–10.3)
Chloride: 104 mmol/L (ref 101–111)
Creatinine, Ser: 0.52 mg/dL — ABNORMAL LOW (ref 0.61–1.24)
GFR calc Af Amer: >60 mL/min (ref >60)
GFR calc non Af Amer: >60 mL/min (ref >60)
GLUCOSE: 103 mg/dL — AB (ref 65–99)
POTASSIUM: 3.5 mmol/L (ref 3.5–5.1)
Sodium: 138 mmol/L (ref 135–145)

## 2017-06-30 NOTE — Progress Notes (Signed)
Patient ID: Marco [name], male   DOB: Nov 22, 1950, 66 y.o.   MRN: [id number]  PROGRESS NOTE    Marco [name]  IWO:[id number] DOB: Jun 04, 1951 DOA: 06/27/2017  PCP: Marco Arnt, MD   Brief Narrative:  66 year old male, followed with hospice at home, has intellectual disability since birth, terminal lung cancer. Patient presented to ED 06/27/2017 with worsening fevers for one week prior to the admission of up to 10 33F, cough and hemoptysis. On admission, he was septic. Chest x-ray showed persistent, marginally worse left hemithorax opacification. White blood cell count was 18.5. Blood cultures were obtained and vancomycin and Zosyn were given. Patient was admitted to stepdown unit. Patient's sister who is a patient legal guardian confirmed DNR/DNI CODE STATUS.   Assessment & Plan:   Principal Problem:   Severe sepsis secondary to pneumonia / Leukocytosis  - Continue cefepime, discontinue the vancomycin with negative MRSA PCR - Monitor in step down unit because of hypotension - Making small fluid boluses if needed  Active Problems:   Acute on chronic hypoxic respiratory failure secondary to healthcare associated pneumonia and terminal lung cancer - May use morphine for help with breathing if needed - Continue oxygen support via nasal cannula to keep oxygen saturation above 90%    Dementia without behavioral disturbance / History of cognitive impairment - Stable mental status, patient is cognitively impaired    Squamous cell carcinoma of lung ([place]) - Follows with hospice      Seizure disorder ([name]) - Continue vimpat    Pressure skin injury, stage 2  - Care per RN  DVT prophylaxis: Lovenox subQ Code Status: DNR/DNI Family Communication: sister (legal guardian) at the bedside  Disposition Plan: remains in SDU due to hypotension    Consultants:   None   Procedures:   None   Antimicrobials:   Cefepime -->   Subjective: No overnight events.    Objective: Vitals:   06/29/17 2000 06/30/17 0001 06/30/17 0400 06/30/17 0741  BP: 93/69  (!) 105/56   Pulse: (!) 118  (!) 110   Resp: (!) 28  18   Temp: 99 F (37.2 C) 100.2 F (37.9 C) (!) 101.2 F (38.4 C) (!) 97.3 F (36.3 C)  TempSrc: Axillary Axillary Axillary Axillary  SpO2: 100%  94%   Weight:      Height:        Intake/Output Summary (Last 24 hours) at 06/30/17 0838 Last data filed at 06/30/17 0600  Gross per 24 hour  Intake             2630 ml  Output             1926 ml  Net              704 ml   Filed Weights   06/27/17 1939 06/28/17 0356  Weight: 45.1 kg (99 lb 6.8 oz) 45.7 kg (100 lb 12 oz)    Examination:  General exam: Appears calm and comfortable, coughing  Respiratory system: congested with coughing, no wheezing  Cardiovascular system: S1 & S2 heard, tachycardic  Gastrointestinal system: Abdomen is nondistended, soft and nontender. No organomegaly or masses felt. Normal bowel sounds heard. Central nervous system: Alert, no focal deficit  Extremities: Symmetric 5 x 5 power. No swelling, palpable pulses  Skin: No rashes, lesions or ulcers Psychiatry: pt is cognitively impaired, no restlessness   Data Reviewed: I have personally reviewed following labs and imaging studies  CBC:  Recent Labs Lab 06/27/17 1535 06/27/17  1543 06/28/17 0353 06/29/17 0326 06/30/17 0322  WBC 18.5*  --  17.0* 17.3* 15.6*  NEUTROABS 17.6*  --   --   --   --   HGB 8.5* 9.5* 8.3* 7.6* 8.0*  HCT 26.7* 28.0* 26.6* 24.2* 25.8*  MCV 80.9  --  81.3 79.3 78.9  PLT 289  --  279 268 885   Basic Metabolic Panel:  Recent Labs Lab 06/27/17 1543 06/28/17 0353 06/29/17 0326 06/30/17 0322  NA 145 142 140 138  K 3.5 3.2* 3.3* 3.5  CL 107 108 108 104  CO2  --  26 25 28   GLUCOSE 118* 107* 114* 103*  BUN 15 11 11 11   CREATININE 0.60* 0.66 0.53* 0.52*  CALCIUM  --  7.6* 7.6* 7.6*   GFR: Estimated Creatinine Clearance: 57.1 mL/min (A) (by C-G formula based on SCr of 0.52  mg/dL (L)). Liver Function Tests: No results for input(s): AST, ALT, ALKPHOS, BILITOT, PROT, ALBUMIN in the last 168 hours. No results for input(s): LIPASE, AMYLASE in the last 168 hours. No results for input(s): AMMONIA in the last 168 hours. Coagulation Profile: No results for input(s): INR, PROTIME in the last 168 hours. Cardiac Enzymes: No results for input(s): CKTOTAL, CKMB, CKMBINDEX, TROPONINI in the last 168 hours. BNP (last 3 results) No results for input(s): PROBNP in the last 8760 hours. HbA1C: No results for input(s): HGBA1C in the last 72 hours. CBG: No results for input(s): GLUCAP in the last 168 hours. Lipid Profile: No results for input(s): CHOL, HDL, LDLCALC, TRIG, CHOLHDL, LDLDIRECT in the last 72 hours. Thyroid Function Tests: No results for input(s): TSH, T4TOTAL, FREET4, T3FREE, THYROIDAB in the last 72 hours. Anemia Panel: No results for input(s): VITAMINB12, FOLATE, FERRITIN, TIBC, IRON, RETICCTPCT in the last 72 hours. Urine analysis:    Component Value Date/Time   COLORURINE YELLOW 06/27/2017 1506   APPEARANCEUR CLEAR 06/27/2017 1506   LABSPEC 1.019 06/27/2017 1506   LABSPEC 1.020 03/16/2015 1520   PHURINE 5.0 06/27/2017 1506   GLUCOSEU NEGATIVE 06/27/2017 1506   GLUCOSEU Negative 03/16/2015 1520   HGBUR NEGATIVE 06/27/2017 [place] 06/27/2017 1506   BILIRUBINUR Negative 03/16/2015 1520   KETONESUR NEGATIVE 06/27/2017 1506   PROTEINUR NEGATIVE 06/27/2017 1506   UROBILINOGEN 0.2 06/27/2015 1543   UROBILINOGEN 0.2 03/16/2015 1520   NITRITE NEGATIVE 06/27/2017 1506   LEUKOCYTESUR NEGATIVE 06/27/2017 1506   LEUKOCYTESUR Negative 03/16/2015 1520   Sepsis Labs: @LABRCNTIP (procalcitonin:4,lacticidven:4)   ) Recent Results (from the past 240 hour(s))  Culture, blood (routine x 2)     Status: None (Preliminary result)   Collection Time: 06/27/17  3:34 PM  Result Value Ref Range Status   Specimen Description BLOOD LEFT WRIST  Final    Special Requests   Final    BOTTLES DRAWN AEROBIC AND ANAEROBIC Blood Culture adequate volume   Culture   Final    NO GROWTH 3 DAYS Performed at [facility], [place] [address], [place], [place] [address]    Report Status PENDING  Incomplete  Culture, blood (routine x 2)     Status: None (Preliminary result)   Collection Time: 06/27/17  3:45 PM  Result Value Ref Range Status   Specimen Description BLOOD LEFT FOREARM  Final   Special Requests   Final    BOTTLES DRAWN AEROBIC AND ANAEROBIC Blood Culture results may not be optimal due to an inadequate volume of blood received in culture bottles   Culture   Final  NO GROWTH 3 DAYS Performed at [facility], [place] [address] [place], [place] [address]    Report Status PENDING  Incomplete  MRSA PCR Screening     Status: None   Collection Time: 06/27/17  6:50 PM  Result Value Ref Range Status   MRSA by PCR NEGATIVE NEGATIVE Final    Comment:        The GeneXpert MRSA Assay (FDA approved for NASAL specimens only), is one component of a comprehensive MRSA colonization surveillance program. It is not intended to diagnose MRSA infection nor to guide or monitor treatment for MRSA infections.       Radiology Studies: Dg Chest Portable 1 View  Result Date: 06/27/2017 CLINICAL DATA:  Shortness of breath and hemoptysis. History of lung cancer. EXAM: PORTABLE CHEST 1 VIEW COMPARISON:  Radiographs 03/28/2017. Chest CT 02/27/2017 and 03/28/2017. FINDINGS: 1527 hour. There is persistent volume loss within and near complete opacification of the left hemithorax. There is mediastinal shift to the left. The right lung is clear. There is no right-sided pleural effusion or pneumothorax. No acute osseous findings are seen. IMPRESSION: Little change is seen from the prior studies of 3 months ago. There is persistent near complete opacification of the left hemithorax with mediastinal shift to the left secondary to a known centrally  obstructing mass and associated pleural fluid. Electronically Signed   By: Richardean Sale M.D.   On: 06/27/2017 15:46        Scheduled Meds: . enoxaparin (LOVENOX) injection  30 mg Subcutaneous Q24H  . lacosamide  200 mg Oral BID  . sodium chloride flush  3 mL Intravenous Q12H   Continuous Infusions: . ceFEPime (MAXIPIME) IV Stopped (06/30/17 0101)  . lactated ringers 100 mL/hr (06/30/17 0500)     LOS: 3 days    Time spent: 25 minutes  Greater than 50% of the time spent on counseling and coordinating the care.   Leisa Lenz, MD [facility] Pager [phone]  If 7PM-7AM, please contact night-coverage [email or web address] Password [facility] 06/30/2017, 8:38 AM

## 2017-06-30 NOTE — Progress Notes (Signed)
[facility] 1227- [facility] GIP RN visit at 0805am.  This is a related and covered GIP admission of 06/27/17 with [facility] diagnosis of Lung Cancer per Dr. Karie Georges. Patient has [place] DNR. Caregiver activated EMS for symptoms of hemoptysis after contacting [facility] and being advised to take patient to ED for evaluation. Patient was admitted for sepsis with fever, tachycardia and hypotension.   Met with patient and his sister at bedside.  Patient was alert and sitting up in bed.  Sister was helping to feed patient a magic cup.  Sister advised that patient had some seizure activity late yesterday afternoon/evening.  [place] and nursing staff realized that he was not getting one of his seizure medications as often as the group home normally gives it.  They adjusted medications.   Patient is in NAD this morning.  He does still have significant congestion, which he is trying to cough up.  Sister advised that the plan was for patient to be moved out of stepdown today to regular room for a couple of days per MD.  I spoke with Ginger, bedside RN, and she advised due to the seizures yesterday, he may stay in stepdown another day, unsure at this time.  We will continue to follow while hospitalized.   Patient receiving:   Enoxaparin (LOVENOX) injection 30 mg, Dose 30 mg, Q24H via [place]; lacosamide (VIMPAT) tablet 200 mg, Dose 200 mg, BID via PO.  Continuous medications:  ceFEPIme (MAXIPIME) 1g in dextrose 5% 53m IVPB, Dose 1 g, Q8H via IV; lactated ringers infusion, Rate 100 mL/hr, Continuous via IV.  PRN medications receivied:  None, thus far today.  If you have any questions or concerns, please feel free to contact me.  Thank you,  AEdyth Gunnels RN, BSN H[facility] CenterLiaison [phone] All hospital liaisons are now on [place]

## 2017-06-30 NOTE — Progress Notes (Signed)
Pt had 2 seizures tonight both lasted less than 2 mins and he recovered quickly with minimal postictal state. With both his HR went into the 180's but recovered quickly. Seizure precautions were placed and orders rec from Hospitalist. Family states he is on every 4 hour scheduled Ativan 1mg  and they feel that he has not been getting this schedules just on as needed basis.

## 2017-07-01 LAB — CBC
HEMATOCRIT: 25.8 % — AB (ref 39.0–52.0)
Hemoglobin: 8 g/dL — ABNORMAL LOW (ref 13.0–17.0)
MCH: 24.8 pg — AB (ref 26.0–34.0)
MCHC: 31 g/dL (ref 30.0–36.0)
MCV: 79.9 fL (ref 78.0–100.0)
Platelets: 302 10*3/uL (ref 150–400)
RBC: 3.23 MIL/uL — AB (ref 4.22–5.81)
RDW: 16.8 % — ABNORMAL HIGH (ref 11.5–15.5)
WBC: 9 10*3/uL (ref 4.0–10.5)

## 2017-07-01 LAB — BASIC METABOLIC PANEL
ANION GAP: 5 (ref 5–15)
BUN: 10 mg/dL (ref 6–20)
CHLORIDE: 104 mmol/L (ref 101–111)
CO2: 31 mmol/L (ref 22–32)
Calcium: 7.7 mg/dL — ABNORMAL LOW (ref 8.9–10.3)
Creatinine, Ser: 0.52 mg/dL — ABNORMAL LOW (ref 0.61–1.24)
GFR calc Af Amer: >60 mL/min (ref >60)
GFR calc non Af Amer: >60 mL/min (ref >60)
GLUCOSE: 98 mg/dL (ref 65–99)
POTASSIUM: 3.1 mmol/L — AB (ref 3.5–5.1)
Sodium: 140 mmol/L (ref 135–145)

## 2017-07-01 NOTE — Progress Notes (Addendum)
Patient ID: Marco [name], male   DOB: 04-07-1951, 66 y.o.   MRN: [id number]  PROGRESS NOTE    Marco [name]  [id number]:[id number] DOB: [birth year] DOA: 06/27/2017  PCP: Leamon Arnt, MD   Brief Narrative:  66 year old male, followed with hospice at home, has intellectual disability since birth, terminal lung cancer. Patient presented to ED 06/27/2017 with worsening fevers for one week prior to the admission of up to 10 8F, cough and hemoptysis. On admission, he was septic. Chest x-ray showed persistent, marginally worse left hemithorax opacification. White blood cell count was 18.5. Blood cultures were obtained and vancomycin and Zosyn were given. Patient was admitted to stepdown unit. Patient's sister who is a patient legal guardian confirmed DNR/DNI CODE STATUS.   Assessment & Plan:   Principal Problem:   Severe sepsis secondary to pneumonia / Leukocytosis  - Continue cefepime, vanco stopped considering negative MRSA PCR - May use small fluid boluses if needed for blood pressure support  Active Problems:   Acute on chronic hypoxic respiratory failure secondary to healthcare associated pneumonia and terminal lung cancer - Continue oxygen support via [place] to keep O2 sats above 90% - Stable resp status     Dementia without behavioral disturbance / History of cognitive impairment - Stable, no changes     Squamous cell carcinoma of lung (HCC) - Follows with hospice    Seizure disorder ([place]) - Continue vimpat - No reports of seizures in past 24 hours     Pressure skin injury, stage 2, sacral decubitus - Per RN  DVT prophylaxis: Lovenox subQ Code Status: DNR/DNI Family Communication: family not at the bedside this am Disposition Plan: will monitor in  SDU for next 24 hours    Consultants:   None   Procedures:   None   Antimicrobials:   Cefepime -->   Subjective: No overnight events.  Objective: Vitals:   07/01/17 0000 07/01/17 0303 07/01/17 0400 07/01/17 0700    BP: 96/60  93/63   Pulse: 87  86   Resp: 14  18   Temp:  97.8 F (36.6 C)  97.6 F (36.4 C)  TempSrc:  Axillary  Oral  SpO2: 97%  98%   Weight:      Height:        Intake/Output Summary (Last 24 hours) at 07/01/17 0844 Last data filed at 07/01/17 0600  Gross per 24 hour  Intake             2650 ml  Output             1700 ml  Net              950 ml   Filed Weights   06/27/17 1939 06/28/17 0356  Weight: 45.1 kg (99 lb 6.8 oz) 45.7 kg (100 lb 12 oz)     Physical Exam  Constitutional: Appears malnourished, no distress   CVS: Rate controlled, S1/S2 + Pulmonary: coarse sounds, no wheezing  Abdominal: Soft. BS +,  no distension, tenderness, rebound or guarding.  Musculoskeletal: NNo edema and no tenderness.  Lymphadenopathy: No lymphadenopathy noted, cervical, inguinal. Neuro: Alert. No focal deficit  Skin: warm, dry Psychiatric: Normal mood and affect.     Data Reviewed: I have personally reviewed following labs and imaging studies  CBC:  Recent Labs Lab 06/27/17 1535 06/27/17 1543 06/28/17 0353 06/29/17 0326 06/30/17 0322 07/01/17 0319  WBC 18.5*  --  17.0* 17.3* 15.6* 9.0  NEUTROABS 17.6*  --   --   --   --   --  HGB 8.5* 9.5* 8.3* 7.6* 8.0* 8.0*  HCT 26.7* 28.0* 26.6* 24.2* 25.8* 25.8*  MCV 80.9  --  81.3 79.3 78.9 79.9  PLT 289  --  279 268 313 915   Basic Metabolic Panel:  Recent Labs Lab 06/27/17 1543 06/28/17 0353 06/29/17 0326 06/30/17 0322 07/01/17 0319  NA 145 142 140 138 140  K 3.5 3.2* 3.3* 3.5 3.1*  CL 107 108 108 104 104  CO2  --  26 25 28 31   GLUCOSE 118* 107* 114* 103* 98  BUN 15 11 11 11 10   CREATININE 0.60* 0.66 0.53* 0.52* 0.52*  CALCIUM  --  7.6* 7.6* 7.6* 7.7*   GFR: Estimated Creatinine Clearance: 57.1 mL/min (A) (by C-G formula based on SCr of 0.52 mg/dL (L)). Liver Function Tests: No results for input(s): AST, ALT, ALKPHOS, BILITOT, PROT, ALBUMIN in the last 168 hours. No results for input(s): LIPASE, AMYLASE in the  last 168 hours. No results for input(s): AMMONIA in the last 168 hours. Coagulation Profile: No results for input(s): INR, PROTIME in the last 168 hours. Cardiac Enzymes: No results for input(s): CKTOTAL, CKMB, CKMBINDEX, TROPONINI in the last 168 hours. BNP (last 3 results) No results for input(s): PROBNP in the last 8760 hours. HbA1C: No results for input(s): HGBA1C in the last 72 hours. CBG: No results for input(s): GLUCAP in the last 168 hours. Lipid Profile: No results for input(s): CHOL, HDL, LDLCALC, TRIG, CHOLHDL, LDLDIRECT in the last 72 hours. Thyroid Function Tests: No results for input(s): TSH, T4TOTAL, FREET4, T3FREE, THYROIDAB in the last 72 hours. Anemia Panel: No results for input(s): VITAMINB12, FOLATE, FERRITIN, TIBC, IRON, RETICCTPCT in the last 72 hours. Urine analysis:    Component Value Date/Time   COLORURINE YELLOW 06/27/2017 1506   APPEARANCEUR CLEAR 06/27/2017 1506   LABSPEC 1.019 06/27/2017 1506   LABSPEC 1.020 03/16/2015 1520   PHURINE 5.0 06/27/2017 1506   GLUCOSEU NEGATIVE 06/27/2017 1506   GLUCOSEU Negative 03/16/2015 1520   HGBUR NEGATIVE 06/27/2017 1506   BILIRUBINUR NEGATIVE 06/27/2017 1506   BILIRUBINUR Negative 03/16/2015 1520   KETONESUR NEGATIVE 06/27/2017 1506   PROTEINUR NEGATIVE 06/27/2017 1506   UROBILINOGEN 0.2 06/27/2015 1543   UROBILINOGEN 0.2 03/16/2015 1520   NITRITE NEGATIVE 06/27/2017 1506   LEUKOCYTESUR NEGATIVE 06/27/2017 1506   LEUKOCYTESUR Negative 03/16/2015 1520   Sepsis Labs: @LABRCNTIP (procalcitonin:4,lacticidven:4)   ) Recent Results (from the past 240 hour(s))  Culture, blood (routine x 2)     Status: None (Preliminary result)   Collection Time: 06/27/17  3:34 PM  Result Value Ref Range Status   Specimen Description BLOOD LEFT WRIST  Final   Special Requests   Final    BOTTLES DRAWN AEROBIC AND ANAEROBIC Blood Culture adequate volume   Culture   Final    NO GROWTH 3 DAYS Performed at [facility], [place] [address] [place], [place] [address]    Report Status PENDING  Incomplete  Culture, blood (routine x 2)     Status: None (Preliminary result)   Collection Time: 06/27/17  3:45 PM  Result Value Ref Range Status   Specimen Description BLOOD LEFT FOREARM  Final   Special Requests   Final    BOTTLES DRAWN AEROBIC AND ANAEROBIC Blood Culture results may not be optimal due to an inadequate volume of blood received in culture bottles   Culture   Final    NO GROWTH 3 DAYS Performed at [facility], [place] [address], [place], [place] [address]  Report Status PENDING  Incomplete  MRSA PCR Screening     Status: None   Collection Time: 06/27/17  6:50 PM  Result Value Ref Range Status   MRSA by PCR NEGATIVE NEGATIVE Final    Comment:        The GeneXpert MRSA Assay (FDA approved for NASAL specimens only), is one component of a comprehensive MRSA colonization surveillance program. It is not intended to diagnose MRSA infection nor to guide or monitor treatment for MRSA infections.       Radiology Studies: Dg Chest Portable 1 View  Result Date: 06/27/2017 CLINICAL DATA:  Shortness of breath and hemoptysis. History of lung cancer. EXAM: PORTABLE CHEST 1 VIEW COMPARISON:  Radiographs 03/28/2017. Chest CT 02/27/2017 and 03/28/2017. FINDINGS: 1527 hour. There is persistent volume loss within and near complete opacification of the left hemithorax. There is mediastinal shift to the left. The right lung is clear. There is no right-sided pleural effusion or pneumothorax. No acute osseous findings are seen. IMPRESSION: Little change is seen from the prior studies of 3 months ago. There is persistent near complete opacification of the left hemithorax with mediastinal shift to the left secondary to a known centrally obstructing mass and associated pleural fluid. Electronically Signed   By: Richardean Sale M.D.   On: 06/27/2017 15:46        Scheduled Meds: . enoxaparin (LOVENOX) injection   30 mg Subcutaneous Q24H  . lacosamide  200 mg Oral BID  . sodium chloride flush  3 mL Intravenous Q12H   Continuous Infusions: . ceFEPime (MAXIPIME) IV Stopped (07/01/17 0229)  . lactated ringers 100 mL/hr at 07/01/17 0202     LOS: 4 days    Time spent: 25 minutes  Greater than 50% of the time spent on counseling and coordinating the care.   Leisa Lenz, MD [facility] Pager [phone]  If 7PM-7AM, please contact night-coverage [email or web address] Password [facility] 07/01/2017, 8:44 AM

## 2017-07-01 NOTE — Progress Notes (Signed)
WL 1227- [facility] [place] G[facility] RN visit at 0840am.  This is a related and covered GIP admission of 06/27/17 with [facility] diagnosis of Lung Cancer per Dr. Karie Georges. Patient has [place] DNR. Caregiver activated EMS for symptoms of hemoptysis after contacting [facility] and being advised to take patient to ED for evaluation. Patient was admitted for sepsis with fever, tachycardia and hypotension.   Met with patient at bedside this morning.   Patient's sister was on her way out and headed back to [place].  She advised that patient ate good this morning and that he was feeling "better".  Patient alert and sitting up in bed.  Patient in NAD.  Patient denies any pain.  Patient answers mostly yes and no questions.  He said "yes" to feeling better.  He said "yes" to his cough was better.  He said "no" to needing anything at this time.  Per bedside RN, patient will mostly likely be moved to the floor today, as he remained without seizures all day yesterday and last night. We will continue to monitor patient while hospitalized.  Patient receiving:   Enoxaparin (LOVENOX) injection 30 mg, Dose 30 mg, Q24H via [place]; lacosamide (VIMPAT) tablet 200 mg, Dose 200 mg, BID via PO.  Continuous medications:  ceFEPIme (MAXIPIME) 1g in dextrose 5% 60m IVPB, Dose 1 g, Q8H via IV; lactated ringers infusion, Rate 100 mL/hr, Continuous via IV.  PRN medications receivied:  LORazepam (ATIVAN) injection 2 mg, Dose 2 mg, Q4H PRN via IV at 0202am.  If you have any hospice related questions, please feel free to contact me.  Thank you.  AEdyth Gunnels RN, BSN [facility] [phone] All hospital liaisons are now on [place]

## 2017-07-02 LAB — CULTURE, BLOOD (ROUTINE X 2)
Culture: NO GROWTH
Culture: NO GROWTH
SPECIAL REQUESTS: ADEQUATE

## 2017-07-02 MED ORDER — POTASSIUM CHLORIDE 20 MEQ/15ML (10%) PO SOLN
40.0000 meq | Freq: Once | ORAL | Status: AC
  Administered 2017-07-02: 40 meq via ORAL
  Filled 2017-07-02: qty 30

## 2017-07-02 MED ORDER — POTASSIUM CHLORIDE 10 MEQ/100ML IV SOLN
10.0000 meq | INTRAVENOUS | Status: AC
  Administered 2017-07-02 (×3): 10 meq via INTRAVENOUS
  Filled 2017-07-02 (×3): qty 100

## 2017-07-02 NOTE — Progress Notes (Signed)
Pharmacy Antibiotic Note  Marco [name] is a 66 y.o. male admitted on 06/27/2017 with sepsis.  Pharmacy has been consulted for Vamcomycin & Cefepime dosing.  Vancomycin has been stopped with neg MRSA PCR and Bcx = NGTD  Today, 07/02/2017 Day # 5 abx   WBC 9.0, trending down  Plan: - Cefepime 1gm IV q8h - Narrow and/or change to PO as clinically indicated  Height: 5\' 6"  (167.6 cm) Weight: 100 lb 12 oz (45.7 kg) IBW/kg (Calculated) : 63.8  Temp (24hrs), Avg:97.9 F (36.6 C), Min:97.4 F (36.3 C), Max:98.2 F (36.8 C)   Recent Labs Lab 06/27/17 1535 06/27/17 1543 06/28/17 0353 06/29/17 0326 06/30/17 0322 07/01/17 0319  WBC 18.5*  --  17.0* 17.3* 15.6* 9.0  CREATININE  --  0.60* 0.66 0.53* 0.52* 0.52*  LATICACIDVEN  --  1.15  --   --   --   --     Estimated Creatinine Clearance: 57.1 mL/min (A) (by C-G formula based on SCr of 0.52 mg/dL (L)).    No Known Allergies  Antimicrobials this admission: 10/24 Vancomycin >> 10/25 10/24 Cefepime >>   Dose adjustments this admission:  Microbiology results: 10/24 BCx: NGTD 10/24 MRSA PCR: neg  Thank you for allowing pharmacy to be a part of this patient's care.   Royetta Asal, PharmD, BCPS Pager [phone] 07/02/2017 11:31 AM

## 2017-07-02 NOTE — Progress Notes (Addendum)
Patient ID: Marco [name], male   DOB: [birth year], 66 y.o.   MRN: [id number]  PROGRESS NOTE    Marco [name]  [id number]:[id number] DOB: 16-Aug-1951 DOA: 06/27/2017  PCP: Leamon Arnt, MD   Brief Narrative:  66 year old male, followed with hospice at home, has intellectual disability since birth, terminal lung cancer. Patient presented to ED 06/27/2017 with worsening fevers for one week prior to the admission of up to 10 85F, cough and hemoptysis. On admission, he was septic. Chest x-ray showed persistent, marginally worse left hemithorax opacification. White blood cell count was 18.5. Blood cultures were obtained and vancomycin and Zosyn were given. Patient was admitted to stepdown unit. Patient's sister who is a patient legal guardian confirmed DNR/DNI CODE STATUS.   Assessment & Plan:   Principal Problem:   Severe sepsis secondary to pneumonia / Leukocytosis  - Continue cefepime, vanco stopped considering negative MRSA PCR - Continue to monitor in SDU as pt slightly hypothermic in past 24 hours - Monitor BP   Active Problems:   Acute on chronic hypoxic respiratory failure secondary to healthcare associated pneumonia and terminal lung cancer - Continue oxygen support via [place] to keep O2 saturation above 90% - Stable resp status     Dementia without behavioral disturbance / History of cognitive impairment - No changes in mental status     Squamous cell carcinoma of lung (HCC) - Follows with hospice     Seizure disorder (HCC) - No reports of seizures in past 48 hours  - Continue vimapt     Hypokalemia - Supplemented - Follow up BMP in am    Pressure skin injury, stage 2, sacral decubitus - Care per RN  DVT prophylaxis: Lovenox subQ Code Status: DNR/DNI Family Communication: no family at the bedside  Disposition Plan: monitor in SDU for next 24 hours    Consultants:   None   Procedures:   None   Antimicrobials:   Cefepime -->   Subjective: No overnight  events.  Objective: Vitals:   07/02/17 0000 07/02/17 0400 07/02/17 0415 07/02/17 0800  BP: (!) 135/100 117/73    Pulse: 86 91    Resp: (!) 21 20    Temp:   98 F (36.7 C) (!) 97.4 F (36.3 C)  TempSrc:   Oral Oral  SpO2: 98% 98%    Weight:      Height:        Intake/Output Summary (Last 24 hours) at 07/02/17 0950 Last data filed at 07/02/17 0800  Gross per 24 hour  Intake             1900 ml  Output             2600 ml  Net             -700 ml   Filed Weights   06/27/17 1939 06/28/17 0356  Weight: 45.1 kg (99 lb 6.8 oz) 45.7 kg (100 lb 12 oz)     Physical Exam  Constitutional: Appears ill, no distress   CVS: RRR, S1/S2 + Pulmonary: coarse sounds, no wheezing  Abdominal: Soft. BS +,  no distension, tenderness, rebound or guarding.  Musculoskeletal: Normal range of motion. No edema and no tenderness.  Lymphadenopathy: No lymphadenopathy noted, cervical, inguinal. Neuro: Alert. No focal deficits  Skin: Skin is warm and dry. Has pressure skin injury over sacrum stage 2  Psychiatric: Normal mood and affect.    Pulmonary urology  Data Reviewed: I have personally reviewed following labs and  imaging studies  CBC:  Recent Labs Lab 06/27/17 1535 06/27/17 1543 06/28/17 0353 06/29/17 0326 06/30/17 0322 07/01/17 0319  WBC 18.5*  --  17.0* 17.3* 15.6* 9.0  NEUTROABS 17.6*  --   --   --   --   --   HGB 8.5* 9.5* 8.3* 7.6* 8.0* 8.0*  HCT 26.7* 28.0* 26.6* 24.2* 25.8* 25.8*  MCV 80.9  --  81.3 79.3 78.9 79.9  PLT 289  --  279 268 313 672   Basic Metabolic Panel:  Recent Labs Lab 06/27/17 1543 06/28/17 0353 06/29/17 0326 06/30/17 0322 07/01/17 0319  NA 145 142 140 138 140  K 3.5 3.2* 3.3* 3.5 3.1*  CL 107 108 108 104 104  CO2  --  26 25 28 31   GLUCOSE 118* 107* 114* 103* 98  BUN 15 11 11 11 10   CREATININE 0.60* 0.66 0.53* 0.52* 0.52*  CALCIUM  --  7.6* 7.6* 7.6* 7.7*   GFR: Estimated Creatinine Clearance: 57.1 mL/min (A) (by C-G formula based on SCr of  0.52 mg/dL (L)). Liver Function Tests: No results for input(s): AST, ALT, ALKPHOS, BILITOT, PROT, ALBUMIN in the last 168 hours. No results for input(s): LIPASE, AMYLASE in the last 168 hours. No results for input(s): AMMONIA in the last 168 hours. Coagulation Profile: No results for input(s): INR, PROTIME in the last 168 hours. Cardiac Enzymes: No results for input(s): CKTOTAL, CKMB, CKMBINDEX, TROPONINI in the last 168 hours. BNP (last 3 results) No results for input(s): PROBNP in the last 8760 hours. HbA1C: No results for input(s): HGBA1C in the last 72 hours. CBG: No results for input(s): GLUCAP in the last 168 hours. Lipid Profile: No results for input(s): CHOL, HDL, LDLCALC, TRIG, CHOLHDL, LDLDIRECT in the last 72 hours. Thyroid Function Tests: No results for input(s): TSH, T4TOTAL, FREET4, T3FREE, THYROIDAB in the last 72 hours. Anemia Panel: No results for input(s): VITAMINB12, FOLATE, FERRITIN, TIBC, IRON, RETICCTPCT in the last 72 hours. Urine analysis:    Component Value Date/Time   COLORURINE YELLOW 06/27/2017 1506   APPEARANCEUR CLEAR 06/27/2017 1506   LABSPEC 1.019 06/27/2017 1506   LABSPEC 1.020 03/16/2015 1520   PHURINE 5.0 06/27/2017 1506   GLUCOSEU NEGATIVE 06/27/2017 1506   GLUCOSEU Negative 03/16/2015 1520   HGBUR NEGATIVE 06/27/2017 1506   BILIRUBINUR NEGATIVE 06/27/2017 1506   BILIRUBINUR Negative 03/16/2015 1520   KETONESUR NEGATIVE 06/27/2017 1506   PROTEINUR NEGATIVE 06/27/2017 1506   UROBILINOGEN 0.2 06/27/2015 1543   UROBILINOGEN 0.2 03/16/2015 1520   NITRITE NEGATIVE 06/27/2017 1506   LEUKOCYTESUR NEGATIVE 06/27/2017 1506   LEUKOCYTESUR Negative 03/16/2015 1520   Sepsis Labs: @LABRCNTIP (procalcitonin:4,lacticidven:4)   ) Recent Results (from the past 240 hour(s))  Culture, blood (routine x 2)     Status: None (Preliminary result)   Collection Time: 06/27/17  3:34 PM  Result Value Ref Range Status   Specimen Description BLOOD LEFT WRIST   Final   Special Requests   Final    BOTTLES DRAWN AEROBIC AND ANAEROBIC Blood Culture adequate volume   Culture   Final    NO GROWTH 4 DAYS Performed at [facility], [place] [address] [place], [place] [address]    Report Status PENDING  Incomplete  Culture, blood (routine x 2)     Status: None (Preliminary result)   Collection Time: 06/27/17  3:45 PM  Result Value Ref Range Status   Specimen Description BLOOD LEFT FOREARM  Final   Special Requests   Final  BOTTLES DRAWN AEROBIC AND ANAEROBIC Blood Culture results may not be optimal due to an inadequate volume of blood received in culture bottles   Culture   Final    NO GROWTH 4 DAYS Performed at [facility], [place] [address] [place],  [address]    Report Status PENDING  Incomplete  MRSA PCR Screening     Status: None   Collection Time: 06/27/17  6:50 PM  Result Value Ref Range Status   MRSA by PCR NEGATIVE NEGATIVE Final    Comment:        The GeneXpert MRSA Assay (FDA approved for NASAL specimens only), is one component of a comprehensive MRSA colonization surveillance program. It is not intended to diagnose MRSA infection nor to guide or monitor treatment for MRSA infections.       Radiology Studies: No results found.      Scheduled Meds: . enoxaparin (LOVENOX) injection  30 mg Subcutaneous Q24H  . lacosamide  200 mg Oral BID  . sodium chloride flush  3 mL Intravenous Q12H   Continuous Infusions: . ceFEPime (MAXIPIME) IV Stopped (07/02/17 0830)  . lactated ringers 100 mL/hr at 07/02/17 0320  . potassium chloride       LOS: 5 days    Time spent: 25 minutes  Greater than 50% of the time spent on counseling and coordinating the care.   Leisa Lenz, MD [facility] Pager [phone]  If 7PM-7AM, please contact night-coverage [email or web address] Password [email or web address] 07/02/2017, 9:50 AM

## 2017-07-02 NOTE — Progress Notes (Signed)
Date:  July 02, 2017 Chart reviewed for concurrent status and case management needs.  Will continue to follow patient progress.  Discharge Planning: following for needs  Expected discharge date: [id number]  Velva Harman, BSN, [place], [place]

## 2017-07-02 NOTE — Progress Notes (Signed)
[facility] [place] Work note Patient was resting in bed, awake. He greeted LCSW with a smile and stated, "what's up girl" as he does at home. He indicated he had no pain but was sick. Patient still has cough. He appeared to eat his breakfast, yet not all of the eggs. No family or group home staff present during visit. Family and group home staff are supportive and take great care of patient. Support offered to patient and team will follow through hospital stay and return home. Katherina Right, [place]

## 2017-07-02 NOTE — Progress Notes (Addendum)
WL 1227- [facility] [place] GIP RN visit @ 9 AM  This is a related and covered GIP admission of 06/27/17 with [facility] diagnosis of Lung Cancer per Dr. Karie Georges. Patient has [place] DNR. Caregiver activated EMS for symptoms of hemoptysis after contacting [facility] and being advised to take patient to ED for evaluation. Patient was admitted for sepsis with fever, tachycardia and hypotension.   Visited with patient in room.  Patient alert and oriented, sitting up in bed.  Patient denies pain or any needs.  No family present at time of visit.  Per discussion with bedside RN, Tilda Franco, patient has not had any seizures since Friday.  Plan is to monitor in SDU the next 24 hours, due to patient being slightly hypothermic. Patient has untouched breakfast tray at bedside and denies wanting to eat.  Moderate cloudy, yellow urine noted in foley catheter drainage bag.  Patient continues to receive IV Maxipime 1 g Q 8 hours via a PIV.  Patient has not required any PRN medication the past 24 hours, per chart review.  Patient's potassium was 3.1 this morning, and he is scheduled to receive 3 doses of 16mEq potassium chloride IV.  [facility] will continue to follow daily and anticipate any discharge needs.  Please feel free to contact with any hospice-related questions or concerns.  Thank you,  Freddi Starr RN, BSN [facility] Liaison [phone]  All hospital liaisons are now on [place].

## 2017-07-03 LAB — BASIC METABOLIC PANEL
Anion gap: 8 (ref 5–15)
BUN: 7 mg/dL (ref 6–20)
CALCIUM: 8.2 mg/dL — AB (ref 8.9–10.3)
CO2: 28 mmol/L (ref 22–32)
CREATININE: 0.5 mg/dL — AB (ref 0.61–1.24)
Chloride: 102 mmol/L (ref 101–111)
GFR calc non Af Amer: >60 mL/min (ref >60)
Glucose, Bld: 96 mg/dL (ref 65–99)
Potassium: 3.9 mmol/L (ref 3.5–5.1)
Sodium: 138 mmol/L (ref 135–145)

## 2017-07-03 LAB — CBC
HCT: 28.2 % — ABNORMAL LOW (ref 39.0–52.0)
Hemoglobin: 8.7 g/dL — ABNORMAL LOW (ref 13.0–17.0)
MCH: 24.6 pg — AB (ref 26.0–34.0)
MCHC: 30.9 g/dL (ref 30.0–36.0)
MCV: 79.7 fL (ref 78.0–100.0)
PLATELETS: 344 10*3/uL (ref 150–400)
RBC: 3.54 MIL/uL — AB (ref 4.22–5.81)
RDW: 16.5 % — ABNORMAL HIGH (ref 11.5–15.5)
WBC: 7.3 10*3/uL (ref 4.0–10.5)

## 2017-07-03 LAB — MAGNESIUM: MAGNESIUM: 1.9 mg/dL (ref 1.7–2.4)

## 2017-07-03 MED ORDER — ENSURE ENLIVE PO LIQD
237.0000 mL | Freq: Two times a day (BID) | ORAL | Status: DC
  Administered 2017-07-03 – 2017-07-05 (×6): 237 mL via ORAL

## 2017-07-03 NOTE — Progress Notes (Signed)
WL 1227- [facility] [place] GIP RN visit @12 :30pm  This is a related and covered GIP admission of 06/27/17 with [facility] diagnosis of Lung Cancer per Dr. Karie Georges. Patient has [place] DNR. Caregiver activated EMS for symptoms of hemoptysis after contacting [facility] and being advised to take patient to ED for evaluation. Patient was admitted for sepsis with fever, tachycardia and hypotension.   Visited with patient in room. Patient sleeping and appears in no distress.  No family present at time of visit. Foley draining clear yellow urine. PIV 20g right upper arm.  Medications: Maxipine 1g IVPB Q8hrs, Lactated ringers infusion 143ml/hr. Ativan 2mg  IV Q4hrs PRN anxiety/seizure last given at 04:15am.  [facility] will continue to follow daily and anticipate any discharge needs. If patient is discharged and needs ambulance transport, please call [facility] for transportation as pt is an existing [facility] patient.  Please feel free to contact with any hospice-related questions or concerns.  Thank you,  Farrel Gordon, RN, [facility] Liaison [phone]  All hospital liaisons are now on [place].

## 2017-07-03 NOTE — Progress Notes (Signed)
Nutrition Follow-up  DOCUMENTATION CODES:   Severe malnutrition in context of chronic illness, Underweight  INTERVENTION:  - Will order Ensure Enlive BID, each supplement provides 350 kcal and 20 grams of protein - Continue to encourage PO intakes of meals and supplements.   NUTRITION DIAGNOSIS:   Malnutrition (severe) related to chronic illness, catabolic illness, cancer and cancer related treatments as evidenced by severe depletion of muscle mass, severe depletion of body fat, percent weight loss. -ongoing  GOAL:   Patient will meet greater than or equal to 90% of their needs -beginning to meet.   MONITOR:   PO intake, Supplement acceptance, Weight trends, Labs, Skin  ASSESSMENT:   66 y.o. male on home hospice with a history of intellectual disability and terminal lung cancer who presented to the ED for worsening fevers over the past week, up to 103F reportedly. History obtained from caretaker due to pt's inability to communicate. The caretaker reported intermittent, worsening hemoptysis (red blood) developing today. She called hospice and was told by the doctor to bring patient to the hospital. On evaluation he is septic with fever, tachycardia, hypotension. 1L NS provided and CXR demonstrated persistent, marginally worse left hemithorax opacification.  10/30 Diet advanced from NPO to Dysphagia 1, thin liquids after RD assessment on 10/25. Pt continues with incomprehensible speech and no family/visitors present. Per doc flow sheet, pt consumed 100% of dinner on 10/26 (830 kcal, 32 grams of protein) and 90% of breakfast this AM (715 kcal, 23 grams of protein). Hospice continues to follow pt. Per Dr. Fortino Sic note this AM, plan to move to tele today. Weight stable 10/24-10/25 and no new weight recorded since 10/25.  Medications reviewed. Labs reviewed; creatinine: 0.5 mcg/dL, Ca: 8.2 mg/dL. IVF: LR @ 100 mL/hr.     10/25 - Pt has been NPO since admission.  - No family/visitors  present and pt with incomprehensible speech.  - Per notes, plan for family to arrive today from out of state and hopeful for further discussions about [place].  - Per Dr. Hadley Pen note yesterday, high likelihood of hospital death. - Per chart review, pt has lost 20 lbs (16.7% body weight) in the past 3 months; this is significant for time frame.   Medication; 10 mEq IV KCl x6 runs today. Lab; K: 3.2 mmol/L. IVF: LR @ 100 mL/hr.     NUTRITION - FOCUSED PHYSICAL EXAM:    Most Recent Value  Orbital Region  Unable to assess  Upper Arm Region  Severe depletion  Thoracic and Lumbar Region  Severe depletion  Temple Region  Severe depletion  Clavicle Bone Region  Mild / Moderate depletion  Clavicle and Acromion Bone Region  Mild / Moderate depletion  Scapular Bone Region  Unable to assess  Dorsal Hand  Unable to assess  Patellar Region  Severe depletion  Anterior Thigh Region  Unable to assess  Posterior Calf Region  Severe depletion  Edema (RD Assessment)  None       Diet Order:  DIET - DYS 1 Room service appropriate? Yes; Fluid consistency: Thin  EDUCATION NEEDS:   No education needs identified at this time  Skin:  Skin Assessment: Skin Integrity Issues: (Stage 3 scrotum and Stage 2 sacral pressure injuries)  Last BM:  10/29  Height:   Ht Readings from Last 1 Encounters:  06/27/17 5\' 6"  (1.676 m)    Weight:   Wt Readings from Last 1 Encounters:  06/28/17 100 lb 12 oz (45.7 kg)    Ideal Body Weight:  64.54 kg  BMI:  Body mass index is 16.26 kg/m.  Estimated Nutritional Needs:   Kcal:  5947-0761 (38-43 kcal/kg)  Protein:  82-91 grams (1.8-2 grams/kg)  Fluid:  >/= 1.7 L/day    Marco Matin, MS, RD, LDN, [facility] Inpatient Clinical Dietitian Pager # [phone] After hours/weekend pager # [phone]

## 2017-07-03 NOTE — Progress Notes (Signed)
Patient ID: Marco [name], male   DOB: 13-Apr-1951, 66 y.o.   MRN: [id number]  PROGRESS NOTE    YACOB WILKERSON  WJX:[id number] DOB: 07/17/1951 DOA: 06/27/2017  PCP: Leamon Arnt, MD   Brief Narrative:  66 year old male, followed with hospice at home, has intellectual disability since birth, terminal lung cancer. Patient presented to ED 06/27/2017 with worsening fevers for one week prior to the admission of up to 10 44F, cough and hemoptysis. On admission, he was septic. Chest x-ray showed persistent, marginally worse left hemithorax opacification. White blood cell count was 18.5. Blood cultures were obtained and vancomycin and Zosyn were given. Patient was admitted to stepdown unit. Patient's sister who is a patient legal guardian confirmed DNR/DNI CODE STATUS.   Assessment & Plan:   Principal Problem:   Severe sepsis secondary to pneumonia / Leukocytosis  - Continue cefepime, vancomycin stopped because MRSA PCR negative - Patient was slightly hypothermic overnight with temperature of 97.44F - We will transfer to telemetry floor today - Blood pressure is 124/65 and oxygen saturation 96%   Active Problems:   Acute on chronic hypoxic respiratory failure secondary to healthcare associated pneumonia and terminal lung cancer - Stable respiratory status - Continue oxygen support via nasal cannula to keep oxygen saturation above 90%     Dementia without behavioral disturbance / History of cognitive impairment - No changes in mental status    Squamous cell carcinoma of lung (HCC) - Follows with hospice    Seizure disorder (HCC) - No recent seizures - Continue Vimpat     Hypokalemia - Supplemented and WNL    Pressure skin injury, stage 2, sacral decubitus - Per RN care   DVT prophylaxis: Lovenox subQ Code Status: DNR/DNI Family Communication: family at the bedside 10/29, no family at the bedside this am Disposition Plan: transfer to telemetry floor today    Consultants:     None  Procedures:   None   Antimicrobials:   Cefepime 10/25 -->   Subjective: No overnight events.  Objective: Vitals:   07/03/17 0349 07/03/17 0400 07/03/17 0700 07/03/17 0800  BP:  (!) 113/56  124/65  Pulse:  86 77   Resp:  20 19 (!) 21  Temp: 97.6 F (36.4 C)   98 F (36.7 C)  TempSrc: Oral   Axillary  SpO2:  100% 98% 96%  Weight:      Height:        Intake/Output Summary (Last 24 hours) at 07/03/17 0901 Last data filed at 07/03/17 0800  Gross per 24 hour  Intake             1540 ml  Output             2125 ml  Net             -585 ml   Filed Weights   06/27/17 1939 06/28/17 0356  Weight: 45.1 kg (99 lb 6.8 oz) 45.7 kg (100 lb 12 oz)     Physical Exam  Constitutional: Appears in no distress  CVS: Rate controlled, S1/S2 + Pulmonary: coarse breath sounds, no wheezing  Abdominal: Soft. BS +,  no distension, tenderness, rebound or guarding.  Musculoskeletal: Normal range of motion. No edema and no tenderness.  Lymphadenopathy: No lymphadenopathy noted, cervical, inguinal. Neuro: Alert. No cranial nerve deficit. Skin: Skin is warm, has stage 2 sacral ulcer  Psychiatric: Normal mood and affect.      Pulmonary urology  Data Reviewed: I have personally reviewed following  labs and imaging studies  CBC:  Recent Labs Lab 06/27/17 1535  06/28/17 0353 06/29/17 0326 06/30/17 0322 07/01/17 0319 07/03/17 0347  WBC 18.5*  --  17.0* 17.3* 15.6* 9.0 7.3  NEUTROABS 17.6*  --   --   --   --   --   --   HGB 8.5*  < > 8.3* 7.6* 8.0* 8.0* 8.7*  HCT 26.7*  < > 26.6* 24.2* 25.8* 25.8* 28.2*  MCV 80.9  --  81.3 79.3 78.9 79.9 79.7  PLT 289  --  279 268 313 302 344  < > = values in this interval not displayed. Basic Metabolic Panel:  Recent Labs Lab 06/28/17 0353 06/29/17 0326 06/30/17 0322 07/01/17 0319 07/03/17 0347  NA 142 140 138 140 138  K 3.2* 3.3* 3.5 3.1* 3.9  CL 108 108 104 104 102  CO2 26 25 28 31 28   GLUCOSE 107* 114* 103* 98 96  BUN  11 11 11 10 7   CREATININE 0.66 0.53* 0.52* 0.52* 0.50*  CALCIUM 7.6* 7.6* 7.6* 7.7* 8.2*  MG  --   --   --   --  1.9   GFR: Estimated Creatinine Clearance: 57.1 mL/min (A) (by C-G formula based on SCr of 0.5 mg/dL (L)). Liver Function Tests: No results for input(s): AST, ALT, ALKPHOS, BILITOT, PROT, ALBUMIN in the last 168 hours. No results for input(s): LIPASE, AMYLASE in the last 168 hours. No results for input(s): AMMONIA in the last 168 hours. Coagulation Profile: No results for input(s): INR, PROTIME in the last 168 hours. Cardiac Enzymes: No results for input(s): CKTOTAL, CKMB, CKMBINDEX, TROPONINI in the last 168 hours. BNP (last 3 results) No results for input(s): PROBNP in the last 8760 hours. HbA1C: No results for input(s): HGBA1C in the last 72 hours. CBG: No results for input(s): GLUCAP in the last 168 hours. Lipid Profile: No results for input(s): CHOL, HDL, LDLCALC, TRIG, CHOLHDL, LDLDIRECT in the last 72 hours. Thyroid Function Tests: No results for input(s): TSH, T4TOTAL, FREET4, T3FREE, THYROIDAB in the last 72 hours. Anemia Panel: No results for input(s): VITAMINB12, FOLATE, FERRITIN, TIBC, IRON, RETICCTPCT in the last 72 hours. Urine analysis:    Component Value Date/Time   COLORURINE YELLOW 06/27/2017 1506   APPEARANCEUR CLEAR 06/27/2017 1506   LABSPEC 1.019 06/27/2017 1506   LABSPEC 1.020 03/16/2015 1520   PHURINE 5.0 06/27/2017 1506   GLUCOSEU NEGATIVE 06/27/2017 1506   GLUCOSEU Negative 03/16/2015 1520   HGBUR NEGATIVE 06/27/2017 1506   BILIRUBINUR NEGATIVE 06/27/2017 1506   BILIRUBINUR Negative 03/16/2015 1520   KETONESUR NEGATIVE 06/27/2017 1506   PROTEINUR NEGATIVE 06/27/2017 1506   UROBILINOGEN 0.2 06/27/2015 1543   UROBILINOGEN 0.2 03/16/2015 1520   NITRITE NEGATIVE 06/27/2017 1506   LEUKOCYTESUR NEGATIVE 06/27/2017 1506   LEUKOCYTESUR Negative 03/16/2015 1520   Sepsis Labs: @LABRCNTIP (procalcitonin:4,lacticidven:4)   ) Recent Results  (from the past 240 hour(s))  Culture, blood (routine x 2)     Status: None   Collection Time: 06/27/17  3:34 PM  Result Value Ref Range Status   Specimen Description BLOOD LEFT WRIST  Final   Special Requests   Final    BOTTLES DRAWN AEROBIC AND ANAEROBIC Blood Culture adequate volume   Culture   Final    NO GROWTH 5 DAYS Performed at [facility], [place] [address], [place], [place] [address]    Report Status 07/02/2017 FINAL  Final  Culture, blood (routine x 2)     Status: None  Collection Time: 06/27/17  3:45 PM  Result Value Ref Range Status   Specimen Description BLOOD LEFT FOREARM  Final   Special Requests   Final    BOTTLES DRAWN AEROBIC AND ANAEROBIC Blood Culture results may not be optimal due to an inadequate volume of blood received in culture bottles   Culture   Final    NO GROWTH 5 DAYS Performed at [facility], [place] [address], [place], [place] [address]    Report Status 07/02/2017 FINAL  Final  MRSA PCR Screening     Status: None   Collection Time: 06/27/17  6:50 PM  Result Value Ref Range Status   MRSA by PCR NEGATIVE NEGATIVE Final    Comment:        The GeneXpert MRSA Assay (FDA approved for NASAL specimens only), is one component of a comprehensive MRSA colonization surveillance program. It is not intended to diagnose MRSA infection nor to guide or monitor treatment for MRSA infections.       Radiology Studies: No results found.      Scheduled Meds: . enoxaparin (LOVENOX) injection  30 mg Subcutaneous Q24H  . lacosamide  200 mg Oral BID  . sodium chloride flush  3 mL Intravenous Q12H   Continuous Infusions: . ceFEPime (MAXIPIME) IV Stopped (07/03/17 0230)  . lactated ringers 100 mL/hr at 07/03/17 0800     LOS: 6 days    Time spent: 25 minutes  Greater than 50% of the time spent on counseling and coordinating the care.   Leisa Lenz, MD [facility] Pager [phone]  If 7PM-7AM, please contact  night-coverage [email or web address] Password [email or web address] 07/03/2017, 9:01 AM

## 2017-07-03 NOTE — Evaluation (Signed)
Physical Therapy Evaluation Patient Details Name: Marco [name] MRN: [id number] DOB: 09-19-1950 Today's Date: 07/03/2017   History of Present Illness  66 year old male, followed with hospice at home, has intellectual disability since birth, terminal lung cancer. Patient presented to ED 06/27/2017 with worsening fevers for one week prior to the admission of up to 10 76F, cough and hemoptysis. On admission, he was septic. Chest x-ray showed persistent, marginally worse left hemithorax opacification.  on Hospice at home.  Clinical Impression  Patient  Found to be coughing constantly. Assisted into sitting at the bed edge. The patient tends to  [facility] the left and does not turn head to right readily. Noted decreased control of saliva. The patient is nonverbal, follows simple commands  Inconsistently. Patient is from [place] with Hospice. No one available to provide current functional level. Pt admitted with above diagnosis. Pt currently with functional limitations due to the deficits listed below (see PT Problem List). Pt will benefit from skilled PT to increase their independence and safety with mobility to allow discharge to the venue listed below.       Follow Up Recommendations No PT follow up    Equipment Recommendations  None recommended by PT    Recommendations for Other Services       Precautions / Restrictions Precautions Precautions: Fall Precaution Comments: sz      Mobility  Bed Mobility Overal bed mobility: Needs Assistance Bed Mobility: Supine to Sit;Sit to Supine     Supine to sit: Max assist Sit to supine: Mod assist   General bed mobility comments: assist with legs and trunk to sit up. patient self assisted legs back onto bed.   Transfers                 General transfer comment: NT  Ambulation/Gait                Stairs            Wheelchair Mobility    Modified Rankin (Stroke Patients Only)       Balance Overall balance  assessment: Needs assistance Sitting-balance support: Feet supported;Bilateral upper extremity supported Sitting balance-Leahy Scale: Poor   Postural control: Right lateral lean;Posterior lean                                   Pertinent Vitals/Pain Pain Assessment: Faces Faces Pain Scale: No hurt    Home Living Family/patient expects to be discharged to:: Private residence Living Arrangements: Group Home Available Help at Discharge: Personal care attendant;Available 24 hours/day Type of Home: Group Home           Additional Comments: uncertain of patient has to climb stairs as documented in previous encounter, no personnel present to give  updated info    Prior Function Level of Independence: Needs assistance   Gait / Transfers Assistance Needed: per chart, 6 mos ago could climb stairs independently           Hand Dominance        Extremity/Trunk Assessment   Upper Extremity Assessment Upper Extremity Assessment: LUE deficits/detail;RUE deficits/detail;Difficult to assess due to impaired cognition RUE Deficits / Details: did not volitionally use for any activity except to prop while sitting LUE Deficits / Details: same as left    Lower Extremity Assessment Lower Extremity Assessment: LLE deficits/detail;RLE deficits/detail RLE Deficits / Details: knee flexion appears tight, did lift leg onto bed. LLE  Deficits / Details: similar to right    Cervical / Trunk Assessment Cervical / Trunk Assessment: Other exceptions Cervical / Trunk Exceptions: scoliotic  Communication      Cognition Arousal/Alertness: Awake/alert   Overall Cognitive Status: Difficult to assess Area of Impairment: Orientation;Following commands                       Following Commands: Follows one step commands inconsistently       General Comments: patient followed simple commands, does not speak      General Comments      Exercises     Assessment/Plan     PT Assessment Patient needs continued PT services  PT Problem List Decreased range of motion;Decreased activity tolerance;Decreased balance;Decreased mobility;Decreased knowledge of precautions;Decreased safety awareness;Decreased cognition       PT Treatment Interventions Gait training;Functional mobility training;Therapeutic activities;Patient/family education    PT Goals (Current goals can be found in the Care Plan section)  Acute Rehab PT Goals PT Goal Formulation: Patient unable to participate in goal setting Time For Goal Achievement: 07/17/17 Potential to Achieve Goals: Fair    Frequency Min 2X/week   Barriers to discharge        Co-evaluation               AM-PAC PT "6 Clicks" Daily Activity  Outcome Measure Difficulty turning over in bed (including adjusting bedclothes, sheets and blankets)?: Unable Difficulty moving from lying on back to sitting on the side of the bed? : Unable Difficulty sitting down on and standing up from a chair with arms (e.g., wheelchair, bedside commode, etc,.)?: Unable Help needed moving to and from a bed to chair (including a wheelchair)?: Total Help needed walking in hospital room?: Total Help needed climbing 3-5 steps with a railing? : Total 6 Click Score: 6    End of Session   Activity Tolerance: Patient tolerated treatment well Patient left: in bed;with call bell/phone within reach;with bed alarm set Nurse Communication: Mobility status PT Visit Diagnosis: Difficulty in walking, not elsewhere classified (R26.2)    Time: 5625-6389 PT Time Calculation (min) (ACUTE ONLY): 27 min   Charges:   PT Evaluation $PT Eval Low Complexity: 1 Low PT Treatments $Therapeutic Activity: 8-22 mins   PT G CodesTresa Endo PT [phone]   Claretha Cooper 07/03/2017, 9:42 AM

## 2017-07-04 MED ORDER — LACTATED RINGERS IV SOLN
INTRAVENOUS | Status: AC
  Administered 2017-07-04 – 2017-07-05 (×2): via INTRAVENOUS

## 2017-07-04 NOTE — Progress Notes (Signed)
Notified [name] at [phone] that patient is transferring to room 1405.

## 2017-07-04 NOTE — Progress Notes (Signed)
[facility] PROGRESS NOTE  Marco [name] JJK:[id number] DOB: September 30, 1950 DOA: 06/27/2017 PCP: Leamon Arnt, MD  Brief summary   66 year old male, followed with hospice at home, has intellectual disability since birth, terminal lung cancer. Patient presented to ED 06/27/2017 with worsening fevers for one week prior to the admission of up to 10 55F, cough and hemoptysis. On admission, he was septic. Chest x-ray showed persistent, marginally worse left hemithorax opacification. White blood cell count was 18.5. Blood cultures were obtained and vancomycin and Zosyn were given. Patient was admitted to stepdown unit. Patient's sister who is a patient legal guardian confirmed DNR/DNI CODE STATUS.  Assessment/Plan:    Severe sepsis secondary to pneumonia / Leukocytosis. Slow improvement. Mild tachycardia. Suspect poissible post obstructive pneumonia. Continue cefepime, vancomycin stopped because MRSA PCR negative -transfer to telemetry floor    Acute on chronic hypoxic respiratory failure secondary to healthcare associated pneumonia and terminal lung cancer. Stable respiratory status. Continue oxygen support via nasal cannula to keep oxygen saturation above 90%     Dementia without behavioral disturbance / History of cognitive impairment.  No changes in mental status    Squamous cell carcinoma of lung ([place]).  Follows with hospice    Seizure disorder ([place]). No recent seizures.  Continue Vimpat     Hypokalemia. Supplemented and WNL    Pressure skin injury, stage 2, sacral decubitus. Per RN care   Prognosis is poor with advanced lung cancer. Complicated with pneumonia. Severe malnutrition, cachexia. Apparently, patient is already on hospice care. Will monitor 24-48 hrs and consider d/c aggressive treatment if no improvement   DVT prophylaxis: Lovenox subQ Code Status: DNR/DNI Family Communication: family at the bedside 10/29, no family at the bedside this am Disposition Plan:  transfer to telemetry floor today    Consultants:   None  Procedures:   None   Antimicrobials:   Cefepime 10/25 -->   HPI/Subjective: Alert. No distress. Afebrile.   Objective: Vitals:   07/03/17 2300 07/04/17 0333  BP:    Pulse:    Resp:    Temp: 98.2 F (36.8 C) 98.6 F (37 C)  SpO2:      Intake/Output Summary (Last 24 hours) at 07/04/17 0836 Last data filed at 07/04/17 0500  Gross per 24 hour  Intake          2136.67 ml  Output             2650 ml  Net          -513.33 ml   Filed Weights   06/27/17 1939 06/28/17 0356  Weight: 45.1 kg (99 lb 6.8 oz) 45.7 kg (100 lb 12 oz)    Exam:   General:  No distress. Cachexia    Cardiovascular: s1,s2 rrr  Respiratory: no wheezing. Diminished in left lung AE  Abdomen: soft. nt  Musculoskeletal: no lege edema    Data Reviewed: Basic Metabolic Panel:  Recent Labs Lab 06/28/17 0353 06/29/17 0326 06/30/17 0322 07/01/17 0319 07/03/17 0347  NA 142 140 138 140 138  K 3.2* 3.3* 3.5 3.1* 3.9  CL 108 108 104 104 102  CO2 26 25 28 31 28   GLUCOSE 107* 114* 103* 98 96  BUN 11 11 11 10 7   CREATININE 0.66 0.53* 0.52* 0.52* 0.50*  CALCIUM 7.6* 7.6* 7.6* 7.7* 8.2*  MG  --   --   --   --  1.9   Liver Function Tests: No results for input(s): AST, ALT, ALKPHOS, BILITOT, PROT, ALBUMIN in  the last 168 hours. No results for input(s): LIPASE, AMYLASE in the last 168 hours. No results for input(s): AMMONIA in the last 168 hours. CBC:  Recent Labs Lab 06/27/17 1535  06/28/17 0353 06/29/17 0326 06/30/17 0322 07/01/17 0319 07/03/17 0347  WBC 18.5*  --  17.0* 17.3* 15.6* 9.0 7.3  NEUTROABS 17.6*  --   --   --   --   --   --   HGB 8.5*  < > 8.3* 7.6* 8.0* 8.0* 8.7*  HCT 26.7*  < > 26.6* 24.2* 25.8* 25.8* 28.2*  MCV 80.9  --  81.3 79.3 78.9 79.9 79.7  PLT 289  --  279 268 313 302 344  < > = values in this interval not displayed. Cardiac Enzymes: No results for input(s): CKTOTAL, CKMB, CKMBINDEX, TROPONINI  in the last 168 hours. BNP (last 3 results) No results for input(s): BNP in the last 8760 hours.  ProBNP (last 3 results) No results for input(s): PROBNP in the last 8760 hours.  CBG: No results for input(s): GLUCAP in the last 168 hours.  Recent Results (from the past 240 hour(s))  Culture, blood (routine x 2)     Status: None   Collection Time: 06/27/17  3:34 PM  Result Value Ref Range Status   Specimen Description BLOOD LEFT WRIST  Final   Special Requests   Final    BOTTLES DRAWN AEROBIC AND ANAEROBIC Blood Culture adequate volume   Culture   Final    NO GROWTH 5 DAYS Performed at [facility], [address] [place], [place] [address]    Report Status 07/02/2017 FINAL  Final  Culture, blood (routine x 2)     Status: None   Collection Time: 06/27/17  3:45 PM  Result Value Ref Range Status   Specimen Description BLOOD LEFT FOREARM  Final   Special Requests   Final    BOTTLES DRAWN AEROBIC AND ANAEROBIC Blood Culture results may not be optimal due to an inadequate volume of blood received in culture bottles   Culture   Final    NO GROWTH 5 DAYS Performed at [facility], [place] [address], [place], [place] [address]    Report Status 07/02/2017 FINAL  Final  MRSA PCR Screening     Status: None   Collection Time: 06/27/17  6:50 PM  Result Value Ref Range Status   MRSA by PCR NEGATIVE NEGATIVE Final    Comment:        The GeneXpert MRSA Assay (FDA approved for NASAL specimens only), is one component of a comprehensive MRSA colonization surveillance program. It is not intended to diagnose MRSA infection nor to guide or monitor treatment for MRSA infections.      Studies: No results found.  Scheduled Meds: . enoxaparin (LOVENOX) injection  30 mg Subcutaneous Q24H  . feeding supplement (ENSURE ENLIVE)  237 mL Oral BID BM  . lacosamide  200 mg Oral BID  . sodium chloride flush  3 mL Intravenous Q12H   Continuous Infusions: . ceFEPime (MAXIPIME) IV  Stopped (07/04/17 0228)  . lactated ringers 100 mL/hr at 07/03/17 2225    Principal Problem:   Severe sepsis [facility]) Active Problems:   Dementia without behavioral disturbance   Squamous cell carcinoma of lung (HCC)   Seizure disorder (HCC)   Acute on chronic respiratory failure with hypoxemia (HCC)   Lobar pneumonia ([place])   Hospice care patient   Pressure injury of skin    Time spent: >  35 minutes     Kinnie Feil  [facility] Pager [phone]. If 7PM-7AM, please contact night-coverage at [email or web address], password [facility] 07/04/2017, 8:36 AM  LOS: 7 days

## 2017-07-04 NOTE — Progress Notes (Signed)
[place] GIP RN visit @09 :00am  This is a related and covered GIP admission of 06/27/17 with [facility] diagnosis of Lung Cancer per Dr. Karie Georges. Patient has [place] DNR. Caregiver activated EMS for symptoms of hemoptysis after contacting [facility] and being advised to take patient to ED for evaluation. Patient was admitted for sepsis with fever, tachycardia and hypotension.   Visited with patient in room. Patient is awake and alert. There is a breakfast tray and it appears he ate all of it.  He did experience some coughing while I visited, otherwise appears to be in no distress.  When asked if he has pain anywhere he complained of stomach pain. I advised the nurse of his complaints. He continues to have a foley catheter draining clear yellow urine. Per most recent MD note the plan is to transfer him to telemetry floor today and monitor 24-48 hours then consider discontinuing aggressive treatment if no improvement.   Medications: Medications: Maxipine 1g IVPB Q8hrs, Lactated ringers infusion 122ml/hr. No PRNs have been given today.   [facility] will continue to follow daily and anticipate any discharge needs.  If patient is discharged and needs ambulance transport, please call [facility] for transportation as pt is an existing [facility] patient.  Please feel free to contact with any hospice-related questions or concerns.  Thank you,  Farrel Gordon, RN, [facility] Liaison [phone]  All hospital liaisons are now on [place].

## 2017-07-05 DIAGNOSIS — J189 Pneumonia, unspecified organism: Secondary | ICD-10-CM

## 2017-07-05 DIAGNOSIS — L8942 Pressure ulcer of contiguous site of back, buttock and hip, stage 2: Secondary | ICD-10-CM

## 2017-07-05 DIAGNOSIS — C3490 Malignant neoplasm of unspecified part of unspecified bronchus or lung: Secondary | ICD-10-CM

## 2017-07-05 LAB — CBC WITH DIFFERENTIAL/PLATELET
BASOS ABS: 0 10*3/uL (ref 0.0–0.1)
BASOS PCT: 0 %
EOS ABS: 0.1 10*3/uL (ref 0.0–0.7)
Eosinophils Relative: 1 %
HEMATOCRIT: 27.4 % — AB (ref 39.0–52.0)
HEMOGLOBIN: 8.4 g/dL — AB (ref 13.0–17.0)
Lymphocytes Relative: 10 %
Lymphs Abs: 1.1 10*3/uL (ref 0.7–4.0)
MCH: 24.3 pg — ABNORMAL LOW (ref 26.0–34.0)
MCHC: 30.7 g/dL (ref 30.0–36.0)
MCV: 79.2 fL (ref 78.0–100.0)
Monocytes Absolute: 0.7 10*3/uL (ref 0.1–1.0)
Monocytes Relative: 7 %
NEUTROS ABS: 8.6 10*3/uL — AB (ref 1.7–7.7)
NEUTROS PCT: 82 %
Platelets: 389 10*3/uL (ref 150–400)
RBC: 3.46 MIL/uL — AB (ref 4.22–5.81)
RDW: 17 % — ABNORMAL HIGH (ref 11.5–15.5)
WBC: 10.4 10*3/uL (ref 4.0–10.5)

## 2017-07-05 LAB — BASIC METABOLIC PANEL
ANION GAP: 7 (ref 5–15)
BUN: 8 mg/dL (ref 6–20)
CALCIUM: 8.5 mg/dL — AB (ref 8.9–10.3)
CO2: 31 mmol/L (ref 22–32)
CREATININE: 0.51 mg/dL — AB (ref 0.61–1.24)
Chloride: 104 mmol/L (ref 101–111)
GFR calc non Af Amer: >60 mL/min (ref >60)
Glucose, Bld: 107 mg/dL — ABNORMAL HIGH (ref 65–99)
Potassium: 3.8 mmol/L (ref 3.5–5.1)
SODIUM: 142 mmol/L (ref 135–145)

## 2017-07-05 LAB — MAGNESIUM: MAGNESIUM: 2 mg/dL (ref 1.7–2.4)

## 2017-07-05 MED ORDER — COLLAGENASE 250 UNIT/GM EX OINT
TOPICAL_OINTMENT | Freq: Every day | CUTANEOUS | Status: DC
  Administered 2017-07-05: 14:00:00 via TOPICAL
  Administered 2017-07-06: 1 via TOPICAL
  Filled 2017-07-05: qty 90

## 2017-07-05 MED ORDER — SODIUM CHLORIDE 0.9 % IV SOLN
200.0000 mg | Freq: Two times a day (BID) | INTRAVENOUS | Status: DC
  Administered 2017-07-05 – 2017-07-06 (×2): 200 mg via INTRAVENOUS
  Filled 2017-07-05 (×3): qty 20

## 2017-07-05 NOTE — Progress Notes (Signed)
PROGRESS NOTE    Marco [name]  OJJ:[id number] DOB: 1951/02/06 DOA: 06/27/2017 PCP: Leamon Arnt, MD    Brief Narrative:  66 year old male, followed with hospice at home, has intellectual disability since birth, terminal lung cancer. Patient presented to ED 06/27/2017 with worsening fevers for one week prior to the admission of up to 10 65F, cough and hemoptysis. On admission, he was septic. Chest x-ray showed persistent, marginally worse left hemithorax opacification. White blood cell count was 18.5. Blood cultures were obtained and vancomycin and Zosyn were given. Patient was admitted to stepdown unit. Patient's sister who is a patient legal guardian confirmed DNR/DNI CODE STATUS.    Assessment & Plan:   Principal Problem:   Severe sepsis ([place]) Active Problems:   Dementia without behavioral disturbance   Squamous cell carcinoma of lung (HCC)   Seizure disorder (HCC)   Acute on chronic respiratory failure with hypoxemia (HCC)   Lobar pneumonia ([place])   Hospice care patient   Pressure injury of skin  #1 severe sepsis secondary to healthcare associated pneumonia/leukocytosis Patient with critical improvement. Patient currently afebrile. Hyperthermia slowly improving. Blood cultures negative to date. MRSA PCR negative. Continue IV cefepime through today as patient currently on day #8. Hycodan as needed. Follow.  #2 acute on chronic hypoxic respiratory failure secondary to HCAP and terminal lung cancer Clinically improving. Respiratory status has improved. Patient afebrile. O2 sats has improved and patient currently 97% on room air. Continue empiric IV antibiotics of cefepime antibiotic day #8 through today. Hycodan as needed.  #3 seizure disorder Stable. Patient with no seizure episodes. Continue Vimpat.  #4 hypokalemia Repleted.  #5   squamous cell carcinoma of the lung  Patient currently being followed by hospice.  #6 pressure skin injuries/stage 2 sacral  decubitus Continue current wound care.  #7 dementia without behavioral disturbance Stable.     DVT prophylaxis: Lovenox Code Status: DO NOT RESUSCITATE Family Communication: No family at bedside. Disposition Plan: Likely home with hospice in the next 24 hours if remains afebrile with continued improvement.   Consultants:   None  Procedures:   Chest x-ray 06/27/2017    Antimicrobials:   IV cefepime 06/28/2017>>>> 07/05/2017   Subjective: Coughing. In bed. Denies chest pain. Denies shortness of breath.  Objective: Vitals:   07/04/17 1802 07/04/17 2128 07/05/17 0647 07/05/17 1040  BP: 97/68 123/79 (!) 93/57 (!) 96/53  Pulse: 100 (!) 109 (!) 104 99  Resp: (!) 24 16 18    Temp: 98.2 F (36.8 C) 98.2 F (36.8 C) 98.2 F (36.8 C)   TempSrc: Oral Oral Oral   SpO2: 100% 100% 97%   Weight:      Height:        Intake/Output Summary (Last 24 hours) at 07/05/17 1133 Last data filed at 07/05/17 0700  Gross per 24 hour  Intake          1783.75 ml  Output              750 ml  Net          1033.75 ml   Filed Weights   06/27/17 1939 06/28/17 0356  Weight: 45.1 kg (99 lb 6.8 oz) 45.7 kg (100 lb 12 oz)    Examination:  General exam: Appears calm and comfortable.  Respiratory system: Decreased breath sounds on the left. Respiratory effort normal. Cardiovascular system: S1 & S2 heard, RRR. No JVD, murmurs, rubs, gallops or clicks. No pedal edema. Gastrointestinal system: Abdomen is nondistended, soft and nontender. No  organomegaly or masses felt. Normal bowel sounds heard. Central nervous system: Alert and oriented. No focal neurological deficits. Extremities: Symmetric 5 x 5 power. Skin: No rashes, lesions or ulcers Psychiatry: Judgement and insight appear poor. Mood & affect appropriate.     Data Reviewed: I have personally reviewed following labs and imaging studies  CBC:  Recent Labs Lab 06/29/17 0326 06/30/17 0322 07/01/17 0319 07/03/17 0347  07/05/17 0939  WBC 17.3* 15.6* 9.0 7.3 10.4  NEUTROABS  --   --   --   --  8.6*  HGB 7.6* 8.0* 8.0* 8.7* 8.4*  HCT 24.2* 25.8* 25.8* 28.2* 27.4*  MCV 79.3 78.9 79.9 79.7 79.2  PLT 268 313 302 344 003   Basic Metabolic Panel:  Recent Labs Lab 06/29/17 0326 06/30/17 0322 07/01/17 0319 07/03/17 0347 07/05/17 0939  NA 140 138 140 138 142  K 3.3* 3.5 3.1* 3.9 3.8  CL 108 104 104 102 104  CO2 25 28 31 28 31   GLUCOSE 114* 103* 98 96 107*  BUN 11 11 10 7 8   CREATININE 0.53* 0.52* 0.52* 0.50* 0.51*  CALCIUM 7.6* 7.6* 7.7* 8.2* 8.5*  MG  --   --   --  1.9 2.0   GFR: Estimated Creatinine Clearance: 57.1 mL/min (A) (by C-G formula based on SCr of 0.51 mg/dL (L)). Liver Function Tests: No results for input(s): AST, ALT, ALKPHOS, BILITOT, PROT, ALBUMIN in the last 168 hours. No results for input(s): LIPASE, AMYLASE in the last 168 hours. No results for input(s): AMMONIA in the last 168 hours. Coagulation Profile: No results for input(s): INR, PROTIME in the last 168 hours. Cardiac Enzymes: No results for input(s): CKTOTAL, CKMB, CKMBINDEX, TROPONINI in the last 168 hours. BNP (last 3 results) No results for input(s): PROBNP in the last 8760 hours. HbA1C: No results for input(s): HGBA1C in the last 72 hours. CBG: No results for input(s): GLUCAP in the last 168 hours. Lipid Profile: No results for input(s): CHOL, HDL, LDLCALC, TRIG, CHOLHDL, LDLDIRECT in the last 72 hours. Thyroid Function Tests: No results for input(s): TSH, T4TOTAL, FREET4, T3FREE, THYROIDAB in the last 72 hours. Anemia Panel: No results for input(s): VITAMINB12, FOLATE, FERRITIN, TIBC, IRON, RETICCTPCT in the last 72 hours. Sepsis Labs: No results for input(s): PROCALCITON, LATICACIDVEN in the last 168 hours.  Recent Results (from the past 240 hour(s))  Culture, blood (routine x 2)     Status: None   Collection Time: 06/27/17  3:34 PM  Result Value Ref Range Status   Specimen Description BLOOD LEFT WRIST   Final   Special Requests   Final    BOTTLES DRAWN AEROBIC AND ANAEROBIC Blood Culture adequate volume   Culture   Final    NO GROWTH 5 DAYS Performed at [facility], [address], [place], [place] [address]    Report Status 07/02/2017 FINAL  Final  Culture, blood (routine x 2)     Status: None   Collection Time: 06/27/17  3:45 PM  Result Value Ref Range Status   Specimen Description BLOOD LEFT FOREARM  Final   Special Requests   Final    BOTTLES DRAWN AEROBIC AND ANAEROBIC Blood Culture results may not be optimal due to an inadequate volume of blood received in culture bottles   Culture   Final    NO GROWTH 5 DAYS Performed at [facility], [place] [address], [place], [place] [address]    Report Status 07/02/2017 FINAL  Final  MRSA PCR  Screening     Status: None   Collection Time: 06/27/17  6:50 PM  Result Value Ref Range Status   MRSA by PCR NEGATIVE NEGATIVE Final    Comment:        The GeneXpert MRSA Assay (FDA approved for NASAL specimens only), is one component of a comprehensive MRSA colonization surveillance program. It is not intended to diagnose MRSA infection nor to guide or monitor treatment for MRSA infections.          Radiology Studies: No results found.      Scheduled Meds: . collagenase   Topical Daily  . enoxaparin (LOVENOX) injection  30 mg Subcutaneous Q24H  . feeding supplement (ENSURE ENLIVE)  237 mL Oral BID BM  . lacosamide  200 mg Oral BID  . sodium chloride flush  3 mL Intravenous Q12H   Continuous Infusions: . ceFEPime (MAXIPIME) IV Stopped (07/05/17 0904)     LOS: 8 days    Time spent: 20 minutes    Jozelynn Danielson, MD [facility] Pager [phone]  If 7PM-7AM, please contact night-coverage [email or web address] Password [email or web address] 07/05/2017, 11:33 AM

## 2017-07-05 NOTE — Progress Notes (Signed)
Physical Therapy Treatment Patient Details Name: Marco [name] MRN: [id number] DOB: 06/19/1951 Today's Date: 07/05/2017    History of Present Illness 66 year old male, followed with hospice at home, has intellectual disability since birth, terminal lung cancer. Patient presented to ED 06/27/2017 with worsening fevers for one week prior to the admission of up to 10 60F, cough and hemoptysis. On admission, he was septic. Chest x-ray showed persistent, marginally worse left hemithorax opacification.  on Hospice at home.    PT Comments    Pt remained nonverbal during session.  Checked BP which was low however RN okay with attempting mobility and will continue to monitor pt.  Pt assisted to sitting EOB however did not appear to wish to participate any further as he was attempting to return to supine.  WOC RN into room end of session.   Follow Up Recommendations  No PT follow up     Equipment Recommendations  None recommended by PT    Recommendations for Other Services       Precautions / Restrictions Precautions Precautions: Fall Precaution Comments: seizure    Mobility  Bed Mobility Overal bed mobility: Needs Assistance Bed Mobility: Supine to Sit;Sit to Supine     Supine to sit: Max assist;+2 for physical assistance;+2 for safety/equipment Sit to supine: Mod assist;+2 for physical assistance;+2 for safety/equipment   General bed mobility comments: pt assisted with bed mobility, sometimes assisting and sometimes resisting,  pt self returning to bed so assisted to supine and repositioned  Transfers                 General transfer comment: pt does not appear to want to attempt, placed RW in front of pt however he was attempting to return to bed  Ambulation/Gait                 Stairs            Wheelchair Mobility    Modified Rankin (Stroke Patients Only)       Balance Overall balance assessment: Needs assistance Sitting-balance support:  Bilateral upper extremity supported;Feet supported Sitting balance-Leahy Scale: Poor Sitting balance - Comments: pt able to sit upright with bil UE support and no external trunk support briefly                                    Cognition Arousal/Alertness: Awake/alert   Overall Cognitive Status: Difficult to assess Area of Impairment: Following commands                       Following Commands: Follows one step commands inconsistently       General Comments: patient followed simple commands, does not speak      Exercises      General Comments        Pertinent Vitals/Pain Pain Assessment: Faces Faces Pain Scale: Hurts little more Pain Intervention(s): Repositioned    Home Living                      Prior Function            PT Goals (current goals can now be found in the care plan section) Progress towards PT goals: Not progressing toward goals - comment (pt did not appear to wish to participate today)    Frequency    Min 2X/week      PT Plan Current  plan remains appropriate    Co-evaluation              AM-PAC PT "6 Clicks" Daily Activity  Outcome Measure  Difficulty turning over in bed (including adjusting bedclothes, sheets and blankets)?: Unable Difficulty moving from lying on back to sitting on the side of the bed? : Unable Difficulty sitting down on and standing up from a chair with arms ([name].g., wheelchair, bedside commode, etc,.)?: Unable Help needed moving to and from a bed to chair (including a wheelchair)?: Total Help needed walking in hospital room?: Total Help needed climbing 3-5 steps with a railing? : Total 6 Click Score: 6    End of Session   Activity Tolerance: Patient tolerated treatment well Patient left: in bed;with bed alarm set;with call bell/phone within reach (with [place] RN) Nurse Communication: Mobility status PT Visit Diagnosis: Difficulty in walking, not elsewhere classified (R26.2)      Time: 2751-7001 PT Time Calculation (min) (ACUTE ONLY): 15 min  Charges:  $Therapeutic Activity: 8-22 mins                    G Codes:      Marco [name], PT, DPT 07/05/2017 Pager: [phone]  Marco [name] 07/05/2017, 1:52 PM

## 2017-07-05 NOTE — Progress Notes (Signed)
[facility] work note Patient was resting in bed and was awake. He voiced no pain and stated that he was ok. LCSW offered bone pillow under his head and he rolled on his side to go to sleep. LCSW met with RN, April to receive update to status. Hospice to continue to provide support during stay and upon return home.  Katherina Right, [place]

## 2017-07-05 NOTE — Progress Notes (Signed)
Pt refused vital signs and PO Vimpat after multiple attempts of educating. Pt became agitated. On call MD made aware, obtained an order for IV Vimpat.

## 2017-07-05 NOTE — Progress Notes (Signed)
[facility]- [facility] G[facility] RN visit @09 :00am  This is a related and covered GIP admission of 06/27/17 with [facility] diagnosis of Lung Cancer per Dr. Karie Georges. Patient has [place] DNR. Caregiver activated EMS for symptoms of hemoptysis after contacting [facility] and being advised to take patient to ED for evaluation. Patient was admitted for sepsis with fever, tachycardia and hypotension.   Visited patient at bedside, no family present. Patient is sleeping and did not arouse to my voice. He appears comfortable and in no distress. He continues to have a foley catheter draining clear yellow urine.   Medications: Maxipine 1g IVPB Q8hrs. No PRNs have been given today.   [facility] will continue to follow daily and anticipate any discharge needs.  If patient is discharged and needs ambulance transport, please call [facility] for transportation as pt is an existing [facility] patient.  Thank you,  Farrel Gordon, RN, [facility] Liaison [phone]  All hospital liaisons are now on [place].

## 2017-07-05 NOTE — Consult Note (Signed)
[facility] Nurse wound consult note Reason for Consult:unstageable wound on left lateral malleolus  Wound type:pressure injury Pressure Injury POA:  Measurement:1.5cm round x 0.1cm with 0.5cm pink friable skin on medial side Wound bed:95% yellow moist loose slough, 5% pink Drainage (amount, consistency, odor) moderate yellow exudate  Periwound:friable pink skin surrounding inury Dressing procedure/placement/frequency:I have provided nurses with orders for Santyl enzymatic dressing changes. Pt is on a specialty mattress, will order Prevalon Boots for prevention of further injuries. Pt is a DNR Hospice pt with cancer diagnosis. There has been no lab work to assess nutrition level but pt appears like he could be malnourished. Would recommend lab work, or protein supplements or nutritional consult, please order if you agree. We will not follow, but will remain available to this patient, to nursing, and the medical and/or surgical teams. Please re-consult if we need to assist further.   Fara Olden, RN-C, WTA-C Wound Treatment Associate

## 2017-07-06 MED ORDER — ENSURE ENLIVE PO LIQD: 237.0000 mL | Freq: Two times a day (BID) | ORAL | 0 refills | Status: AC

## 2017-07-06 MED ORDER — COLLAGENASE 250 UNIT/GM EX OINT
TOPICAL_OINTMENT | Freq: Every day | CUTANEOUS | 0 refills | Status: AC
Start: 2017-07-07 — End: ?

## 2017-07-06 MED ORDER — ENSURE ENLIVE PO LIQD: 237.0000 mL | Freq: Two times a day (BID) | ORAL | 0 refills | Status: DC

## 2017-07-06 NOTE — Discharge Summary (Signed)
Physician Discharge Summary  Marco [name] UJW:[id number] DOB: 04/03/1951 DOA: 06/27/2017  PCP: Leamon Arnt, MD  Admit date: 06/27/2017 Discharge date: 07/06/2017  Time spent: 65 minutes  Recommendations for Outpatient Follow-up:  1. Patient will be discharged to a group home with hospice following. 2. Follow-up with Leamon Arnt, MD in 1-2 weeks.   Discharge Diagnoses:  Principal Problem:   Severe sepsis ([place]) Active Problems:   Dementia without behavioral disturbance   Squamous cell carcinoma of lung (HCC)   Seizure disorder (HCC)   Acute on chronic respiratory failure with hypoxemia (HCC)   Lobar pneumonia ([place])   Hospice care patient   Pressure injury of skin   HCAP (healthcare-associated pneumonia)   Discharge Condition: Stable and improved.  Diet recommendation: Dysphagia 1 diet.  Filed Weights   06/27/17 1939 06/28/17 0356  Weight: 45.1 kg (99 lb 6.8 oz) 45.7 kg (100 lb 12 oz)    History of present illness:  Per Dr Prescott Parma is a 66 y.o. male on home hospice with a history of intellectual disability and terminal lung cancer who presented to the ED for worsening fevers over the past week, up to 103F reportedly. History obtained from caretaker due to pt's inability to communicate. The caretaker reported intermittent, worsening hemoptysis (red blood) developing on day of admission. She called hospice and was told by the doctor to bring patient to the hospital. On evaluation he was septic with fever, tachycardia, hypotension. 1L NS provided and CXR demonstrated persistent, marginally worse left hemithorax opacification. WBC 18.5, lactic acid 1.15. Blood cultures drawn, vancomycin and zosyn given, and [facility] called to admit.   On hospitalist evaluation BP is 90/60, HR 115, RR 22. Called pt's sister (HCPOA) to delineate goals of care (as he has been admitted to the hospital multiple times recently, switching from full code to DNR, etc.). She confirmed he  was DNR/DNI and would not want pressors, but would want IV fluids and antibiotics. She will leave from [place] and the patient's other sister in [place] is planning to be at the hospital on day of admission.  Hospital Course:  #1 severe sepsis secondary to healthcare associated pneumonia/leukocytosis Patient had presented with sepsis felt secondary to health care associated pneumonia noted to have a leukocytosis on admission. Patient was pancultured with no growth to date. MRSA PCR was negative. Patient initially was empirically placed on IV cefepime as well as Hycodan as needed. Patient improved slowly during the hospitalization. Patient afebrile rest and leukocytosis had resolved by day of discharge. Patient received a total of 8 days of IV antibiotics and needs no further antibiotics on discharge. Patient will be discharged to a group home.   #2 acute on chronic hypoxic respiratory failure secondary to HCAP and terminal lung cancer Patient was admitted with a pneumonia felt likely to be secondary to healthcare associated pneumonia in the setting of terminal cancer. Patient was initially placed in the step down unit where he was monitored closely and placed empirically on IV cefepime. Patient improved on a daily basis during the hospitalization and subsequently transferred to the telemetry floor where he remained stable. Patient completed 8 day course of IV antibiotics and needs no further antibiotic treatment. Patient be discharged back to the group home with hospice following.   #3 seizure disorder Stable. Patient with no seizure episodes. Patient maintained on home regimen of Vimpat.  #4 hypokalemia Repleted.  #5   squamous cell carcinoma of the lung  Patient currently being  followed by hospice.  #6 pressure skin injuries/stage 2 sacral decubitus Maintained on home care.  #7 dementia without behavioral disturbance Remained stable.   Procedures:  Chest x-ray  06/27/2017  Consultations:  None  Discharge Exam: Vitals:   07/06/17 0536 07/06/17 1259  BP: (!) 106/52 110/60  Pulse: (!) 54   Resp: 20 20  Temp: 99 F (37.2 C) 98.8 F (37.1 C)  SpO2: 92% 93%    General: NAD. Cardiovascular: RRR Respiratory: CTAB anterior lung fields.  Discharge Instructions   Discharge Instructions    Diet general    Complete by:  As directed    Dysphagia 1 diet   Increase activity slowly    Complete by:  As directed      Current Discharge Medication List    START taking these medications   Details  collagenase (SANTYL) ointment Apply topically daily. Qty: 15 g, Refills: 0    feeding supplement, ENSURE ENLIVE, (ENSURE ENLIVE) LIQD Take 237 mLs by mouth 2 (two) times daily between meals. Qty: 30 Bottle, Refills: 0      CONTINUE these medications which have NOT CHANGED   Details  acetaminophen (TYLENOL) 500 MG tablet Take 500 mg by mouth. EVERY 4 TO 6 HOURS AS NEEDED FOR HEADACHE    Dextromethorphan Polistirex (DELSYM PO) Take 5 mLs by mouth every 12 (twelve) hours as needed (COUGHING).    diazepam (DIASTAT ACUDIAL) 10 MG GEL Place 5 mg rectally once as needed for seizure.    HYDROcodone-homatropine (HYCODAN) 5-1.5 MG/5ML syrup Take 5 mLs by mouth every 6 (six) hours as needed for cough. Qty: 120 mL, Refills: 0   Associated Diagnoses: Squamous cell lung cancer, unspecified laterality (HCC)    ketoconazole (NIZORAL) 2 % cream Apply 1 application topically 2 (two) times daily as needed for irritation. Pt applies to buttocks.     lacosamide (VIMPAT) 200 MG TABS tablet Take 1 tablet (200 mg total) by mouth 2 (two) times daily. Qty: 60 tablet, Refills: 5    LORazepam (ATIVAN) 1 MG tablet Take 2 tablets (2 mg total) by mouth at bedtime. Qty: 30 tablet, Refills: 0    Morphine Sulfate in Dextrose 100-5 MG/100ML-% SOLN Inject 0.25 mLs into the vein. EVERY 4 TO 6 HOURS AS NEEDED FOR PAIN    loperamide (IMODIUM A-D) 2 MG tablet Take 2 mg by  mouth every 4 (four) hours as needed for diarrhea or loose stools.      STOP taking these medications     levETIRAcetam (KEPPRA) 750 MG tablet      risperiDONE (RISPERDAL) 1 MG tablet        No Known Allergies Follow-up Information    Leamon Arnt, MD. Schedule an appointment as soon as possible for a visit in 1 week(s).   Specialty:  Family Medicine Why:  f/u in 1-2 weeks. Contact information: [place] [address] [phone]        hospice Follow up.            The results of significant diagnostics from this hospitalization (including imaging, microbiology, ancillary and laboratory) are listed below for reference.    Significant Diagnostic Studies: Dg Chest Portable 1 View  Result Date: 06/27/2017 CLINICAL DATA:  Shortness of breath and hemoptysis. History of lung cancer. EXAM: PORTABLE CHEST 1 VIEW COMPARISON:  Radiographs 03/28/2017. Chest CT 02/27/2017 and 03/28/2017. FINDINGS: 1527 hour. There is persistent volume loss within and near complete opacification of the left hemithorax. There is mediastinal  shift to the left. The right lung is clear. There is no right-sided pleural effusion or pneumothorax. No acute osseous findings are seen. IMPRESSION: Little change is seen from the prior studies of 3 months ago. There is persistent near complete opacification of the left hemithorax with mediastinal shift to the left secondary to a known centrally obstructing mass and associated pleural fluid. Electronically Signed   By: Richardean Sale M.D.   On: 06/27/2017 15:46    Microbiology: Recent Results (from the past 240 hour(s))  Culture, blood (routine x 2)     Status: None   Collection Time: 06/27/17  3:34 PM  Result Value Ref Range Status   Specimen Description BLOOD LEFT WRIST  Final   Special Requests   Final    BOTTLES DRAWN AEROBIC AND ANAEROBIC Blood Culture adequate volume   Culture   Final    NO GROWTH 5 DAYS Performed at  [facility], [address], [place], [place] [address]    Report Status 07/02/2017 FINAL  Final  Culture, blood (routine x 2)     Status: None   Collection Time: 06/27/17  3:45 PM  Result Value Ref Range Status   Specimen Description BLOOD LEFT FOREARM  Final   Special Requests   Final    BOTTLES DRAWN AEROBIC AND ANAEROBIC Blood Culture results may not be optimal due to an inadequate volume of blood received in culture bottles   Culture   Final    NO GROWTH 5 DAYS Performed at [facility], [place] [address] [place], [place] [address]    Report Status 07/02/2017 FINAL  Final  MRSA PCR Screening     Status: None   Collection Time: 06/27/17  6:50 PM  Result Value Ref Range Status   MRSA by PCR NEGATIVE NEGATIVE Final    Comment:        The GeneXpert MRSA Assay (FDA approved for NASAL specimens only), is one component of a comprehensive MRSA colonization surveillance program. It is not intended to diagnose MRSA infection nor to guide or monitor treatment for MRSA infections.      Labs: Basic Metabolic Panel:  Recent Labs Lab 06/30/17 0322 07/01/17 0319 07/03/17 0347 07/05/17 0939  NA 138 140 138 142  K 3.5 3.1* 3.9 3.8  CL 104 104 102 104  CO2 28 31 28 31   GLUCOSE 103* 98 96 107*  BUN 11 10 7 8   CREATININE 0.52* 0.52* 0.50* 0.51*  CALCIUM 7.6* 7.7* 8.2* 8.5*  MG  --   --  1.9 2.0   Liver Function Tests: No results for input(s): AST, ALT, ALKPHOS, BILITOT, PROT, ALBUMIN in the last 168 hours. No results for input(s): LIPASE, AMYLASE in the last 168 hours. No results for input(s): AMMONIA in the last 168 hours. CBC:  Recent Labs Lab 06/30/17 0322 07/01/17 0319 07/03/17 0347 07/05/17 0939  WBC 15.6* 9.0 7.3 10.4  NEUTROABS  --   --   --  8.6*  HGB 8.0* 8.0* 8.7* 8.4*  HCT 25.8* 25.8* 28.2* 27.4*  MCV 78.9 79.9 79.7 79.2  PLT 313 302 344 389   Cardiac Enzymes: No results for input(s): CKTOTAL, CKMB, CKMBINDEX, TROPONINI in the last 168  hours. BNP: BNP (last 3 results) No results for input(s): BNP in the last 8760 hours.  ProBNP (last 3 results) No results for input(s): PROBNP in the last 8760 hours.  CBG: No results for input(s): GLUCAP in the last 168 hours.  SignedIrine Seal MD.  [facility] 07/06/2017, 3:08 PM

## 2017-07-06 NOTE — Progress Notes (Signed)
[facility] 1227- [facility] RN visit @ 1040am  This is a related and covered GIP admission of 06/27/17 with [facility] diagnosis of Lung Cancer per Dr. Karie Georges. Patient has [place] DNR. Caregiver activated EMS for symptoms of hemoptysis after contacting [facility] and being advised to take patient to ED for evaluation. Patient was admitted for sepsis with fever, tachycardia and hypotension.   Visited with patient at bedside.  Patient was alert and more talkative this morning.  Patient states he is ready to go home.  Patient said [name] is supposed to be coming.  Patient is in NAD at this time.  Patient was anxious to leave the hospital.  I spoke with Alyse Low, bedside RN, who advised that patient is still mostly likely on track to go back to group home today.  I checked with Joelene Millin, CSW, who advised that she is just waiting for Dr.Thompson to make rounds and that she would know more later today.  I also talked with her about transport home and advised that since patient is an existing [facility] patient, that he would need to go back to group home by [facility].  She verbalized understanding of same.  We will continue to follow patient while hospitalized and anticipate any discharge needs.   Patient receiving:  Enoxaparin (LOVENOX) injection 30 mg, Dose 30 mg, Q24H via Susan Moore; feeding supplement (ENSURE ENLIVE) liquid 237 mL, Dose 237 mL, BID between meals via PO.  Continuous medications:  lacosamide (VIMPAT) 200 mg in sodium chloride 0.9% 25 mL IVPB, Dose 200 mg, Q12H via IV.  Patient has received no PRN medications today thus far.  Thank you.  Edyth Gunnels, RN, BSN [facility] Liaison [phone]  All hospital liaisons are now on [place].

## 2017-07-06 NOTE — Progress Notes (Signed)
Patient returning to [facility]. CSW spoke with caregiver [name] [phone]) who confirmed patient's ability to return and that staff would be at the group home to receive patient. Caregiver reported that patient will need [facility] transport. CSW provided update to [place] contacted [place] and arranged transportation for 5 PM, per RN request. Packet complete and CSW signing off, no other needs identified.   Abundio Miu, [place] Social Worker [facility] Cell#: [phone]

## 2017-07-06 NOTE — NC FL2 (Signed)
[place] LEVEL OF CARE SCREENING TOOL     IDENTIFICATION  Patient Name: Marco [name] Birthdate: 1950-10-17 Sex: male Admission Date (Current Location): 06/27/2017  [facility] and [place] Number:  Herbalist and Address:  [facility],  [place], [place]      Provider Number: [id number]  Attending Physician Name and Address:  Eugenie Filler, MD  Relative Name and Phone Number:       Current Level of Care: Hospital Recommended Level of Care: Other (Comment) (Group Home) Prior Approval Number:    Date Approved/Denied:   PASRR Number:    Discharge Plan:  (Group Home)    Current Diagnoses: Patient Active Problem List   Diagnosis Date Noted  . HCAP (healthcare-associated pneumonia)   . Pressure injury of skin 06/28/2017  . Severe sepsis ([place]) 06/27/2017  . Lobar pneumonia ([place])   . Hospice care patient   . Seizure disorder ([place])   . Seizure ([place]) 01/14/2017  . Recurrent seizures ([place]) 01/01/2017  . Seizures ([place]) 01/01/2017  . Dysphagia 01/01/2017  . Encounter for antineoplastic immunotherapy 12/21/2016  . Encounter for antineoplastic chemotherapy 10/10/2016  . Hypertension 09/14/2016  . AKI (acute kidney injury) ([place]) 04/23/2016  . Acute on chronic respiratory failure with hypoxemia ([place]) 04/23/2016  . Protein-calorie malnutrition, severe ([place]) 01/28/2016  . Altered mental status   . Post-ictal confusion   . Acute encephalopathy 01/25/2016  . Squamous cell carcinoma of lung ([place]) 01/25/2016  . Seizure disorder ([place]) 01/25/2016  . Seizure ([place]) 08/22/2015  . Malnutrition of moderate degree 07/02/2015  . Sepsis ([place]) 07/01/2015  . UTI (urinary tract infection) 07/01/2015  . ARF (acute renal failure) ([place]) 07/01/2015  . Dementia without behavioral disturbance 07/01/2015  . Leukocytosis 07/01/2015  . Hypokalemia 07/01/2015  . Hypernatremia 07/01/2015  . Convulsions/seizures ([place]) 05/20/2015  . 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 Acute respiratory failure with hypoxia ([place])   . Goals of care, counseling/discussion 04/05/2015  . Squamous cell lung cancer ([place]) 09/10/2013  . Severe intellectual disability with intelligence quotient 20 to 34 10/29/2012    Orientation RESPIRATION BLADDER Height & Weight     Self  Normal Incontinent  Weight: 100 lb 12 oz (45.7 kg) Height:  5\' 6"  (167.6 cm)  BEHAVIORAL SYMPTOMS/MOOD NEUROLOGICAL BOWEL NUTRITION STATUS      Incontinent Diet (Dysphagia 1)  AMBULATORY STATUS COMMUNICATION OF NEEDS Skin   Total Care Does not communicate PU Stage and Appropriate Care   PU Stage 2 Dressing:  (Partial Thickenss loss of dermis, shallow open ulcer red, pink w/ slough) Location : Sacrum Foam Dressing: PRN  Pressure Injury Stage III - Full thickness tissue loss. Subcutaneous fat may be visible but bone, tendon or muscle not exposed. Location: Left Ankle Moist to Dry; Gauze Dressing Daily                   Personal Care Assistance Level of Assistance  Bathing, Feeding, Dressing Bathing Assistance: Maximum assistance Feeding assistance: Limited assistance Dressing Assistance: Maximum assistance     Functional Limitations Info  Sight, Hearing, Speech Sight Info: Adequate Hearing Info: Adequate Speech Info: Impaired    SPECIAL CARE FACTORS FREQUENCY                       Contractures Contractures Info: Present    Additional Factors Info  Code Status, Allergies Code Status Info: DNR Allergies Info: No Known Allergies  Discharge Medications:  Current Discharge Medication List        START taking these medications   Details  collagenase (SANTYL) ointment Apply topically daily. Qty: 15 g, Refills: 0    feeding supplement, ENSURE ENLIVE, (ENSURE ENLIVE) LIQD Take 237 mLs by mouth 2 (two) times daily between meals. Qty: 30 Bottle, Refills: 0          CONTINUE these medications which have NOT CHANGED    Details  acetaminophen (TYLENOL) 500 MG tablet Take 500 mg by mouth. EVERY 4 TO 6 HOURS AS NEEDED FOR HEADACHE    Dextromethorphan Polistirex (DELSYM PO) Take 5 mLs by mouth every 12 (twelve) hours as needed (COUGHING).    diazepam (DIASTAT ACUDIAL) 10 MG GEL Place 5 mg rectally once as needed for seizure.    HYDROcodone-homatropine (HYCODAN) 5-1.5 MG/5ML syrup Take 5 mLs by mouth every 6 (six) hours as needed for cough. Qty: 120 mL, Refills: 0   Associated Diagnoses: Squamous cell lung cancer, unspecified laterality (HCC)    ketoconazole (NIZORAL) 2 % cream Apply 1 application topically 2 (two) times daily as needed for irritation. Pt applies to buttocks.     lacosamide (VIMPAT) 200 MG TABS tablet Take 1 tablet (200 mg total) by mouth 2 (two) times daily. Qty: 60 tablet, Refills: 5    LORazepam (ATIVAN) 1 MG tablet Take 2 tablets (2 mg total) by mouth at bedtime. Qty: 30 tablet, Refills: 0    Morphine Sulfate in Dextrose 100-5 MG/100ML-% SOLN Inject 0.25 mLs into the vein. EVERY 4 TO 6 HOURS AS NEEDED FOR PAIN    loperamide (IMODIUM A-D) 2 MG tablet Take 2 mg by mouth every 4 (four) hours as needed for diarrhea or loose stools.         STOP taking these medications     levETIRAcetam (KEPPRA) 750 MG tablet      risperiDONE (RISPERDAL) 1 MG tablet         Relevant Imaging Results:  Relevant Lab Results:   Additional Information ss#: [id number]  Burnis Medin, LCSW

## 2017-07-06 NOTE — Care Management Note (Signed)
Case Management Note  Patient Details  Name: AKSH SWART MRN: [id number] Date of Birth: 04/04/1951  Subjective/Objective: Returning back to [facility] w/[facility] services.CSW managing return.                  Action/Plan:d/c plan group home w/[facility]   Expected Discharge Date:  07/06/17               Expected Discharge Plan:  Home w Hospice Care  In-House Referral:     Discharge planning Services     Post Acute Care Choice:  Hospice (Active w/[facility]) Choice offered to:     DME Arranged:    DME Agency:     HH Arranged:    HH Agency:     Status of Service:  Completed, signed off  If discussed at [employer] of Stay Meetings, dates discussed:    Additional Comments:  Dessa Phi, RN 07/06/2017, 4:19 PM

## 2017-08-04 DEATH — deceased

## 2017-08-10 ENCOUNTER — Other Ambulatory Visit: Payer: Self-pay | Admitting: Nurse Practitioner

## 2017-08-30 ENCOUNTER — Telehealth: Payer: Self-pay | Admitting: Neurology

## 2017-08-30 NOTE — Telephone Encounter (Signed)
Group home called to inform us that [employer] had passed away on 2017/08/03   [facility]

## 2017-10-31 NOTE — Telephone Encounter (Signed)
Error

## 2018-04-25 IMAGING — CT CT HEAD W/O CM
4 series · 15 of 47 positions shown, 17 images · non-contrast
Comparison: None.

CLINICAL DATA: Progressive weakness. Personal history of lung
cancer. Patient was given IV contrast for a CT of the chest
hours previous.

EXAM:
CT HEAD WITHOUT CONTRAST
TECHNIQUE: Contiguous axial images were obtained from the base of the skull
through the vertex without intravenous contrast.

[Series 3: head without · axial · non-contrast · 0.46mm/px · z∈[-59,+51]mm · 6 of 32 slices shown, 8 images]
[im 5/32  brain]
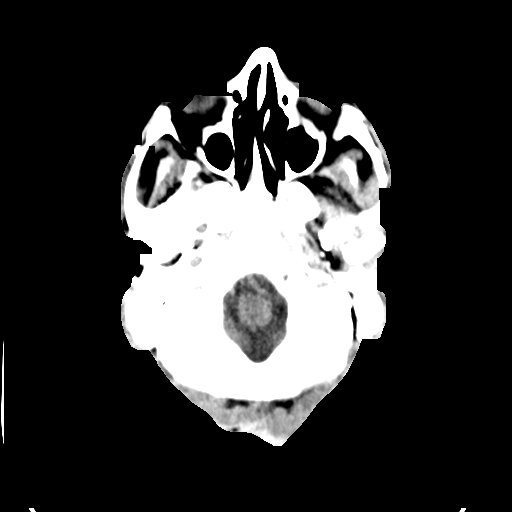
[im 5/32  bone]
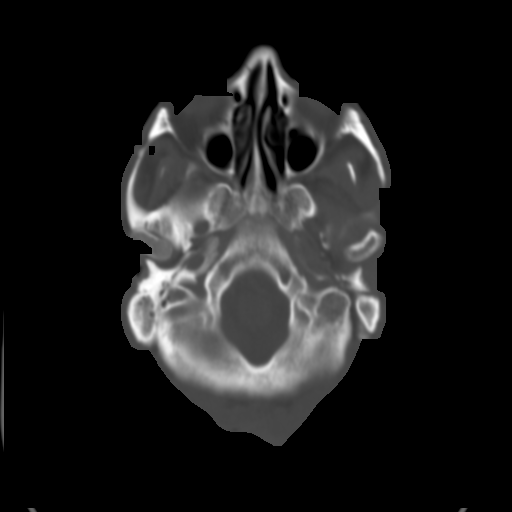
[im 9/32  brain]
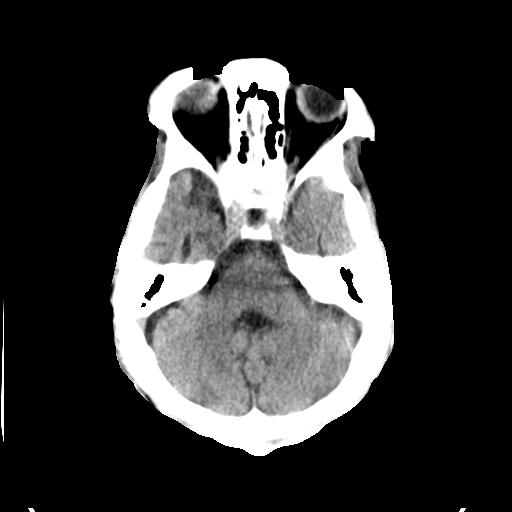
[im 14/32  brain]
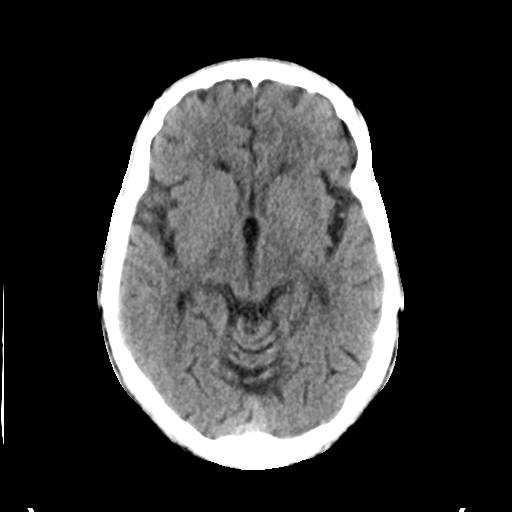
[im 18/32  brain]
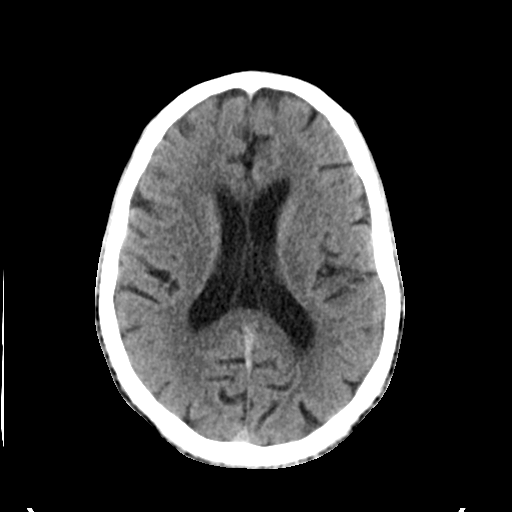
[im 23/32  brain]
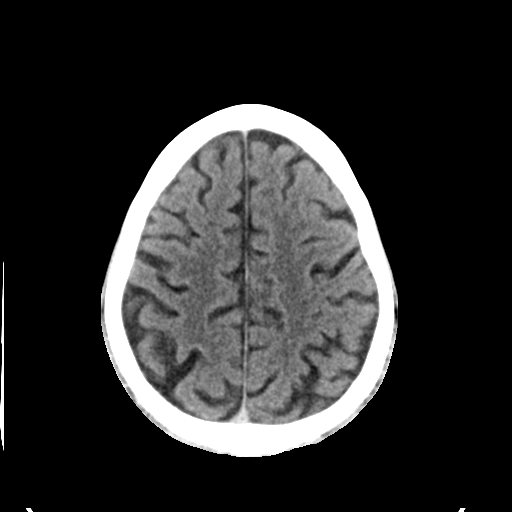
[im 23/32  bone]
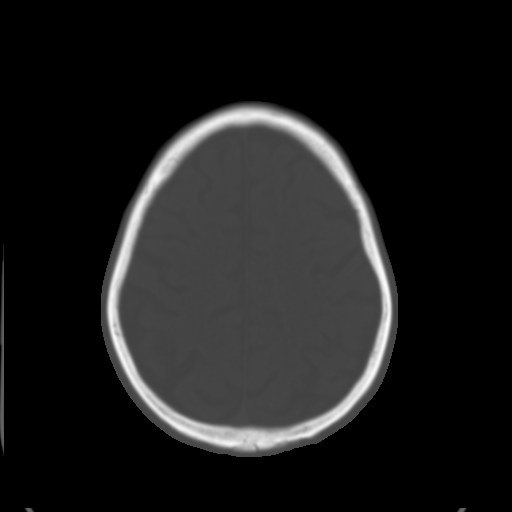
[im 27/32  brain]
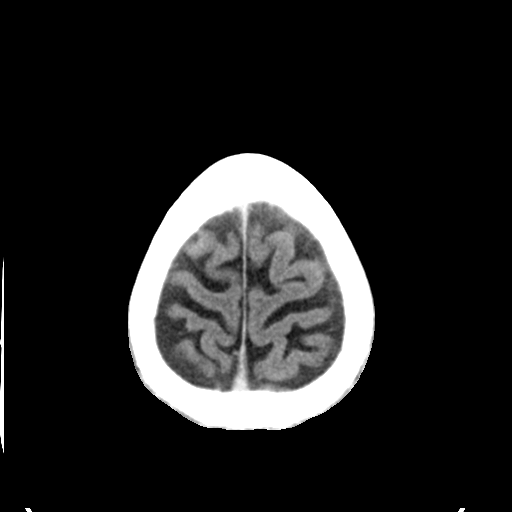

[Series 4: head bone · axial · 0.46mm/px · z∈[-65,-25]mm · 3 of 83 slices shown]
[im 8/83  bone]
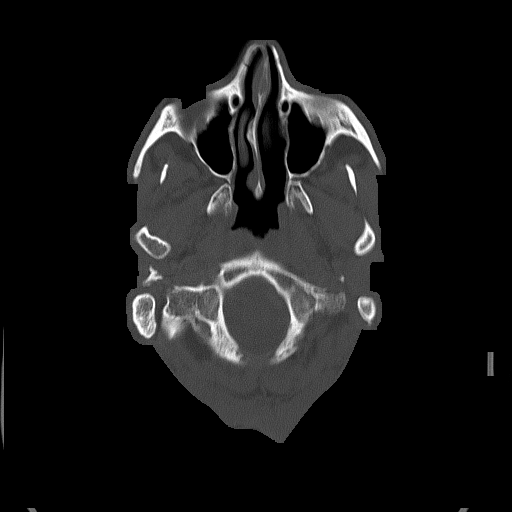
[im 16/83  bone]
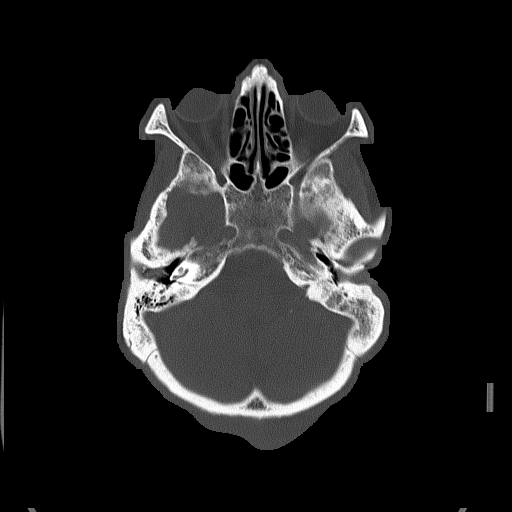
[im 28/83  bone]
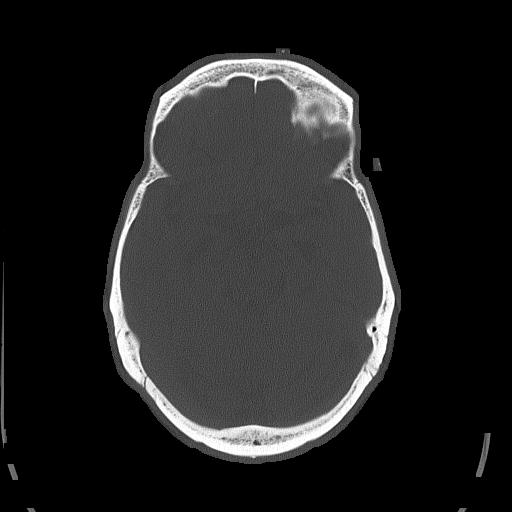

[Series 5: head without sag · sagittal · non-contrast · 0.38mm/px · 3 of 64 slices shown]
[im 22/64  brain]
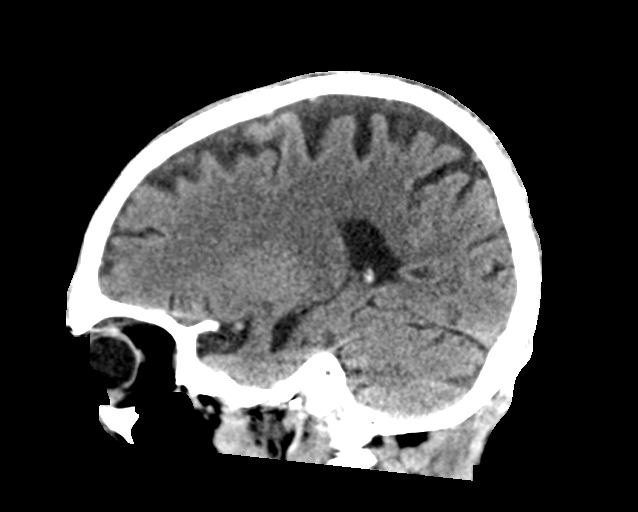
[im 32/64  brain]
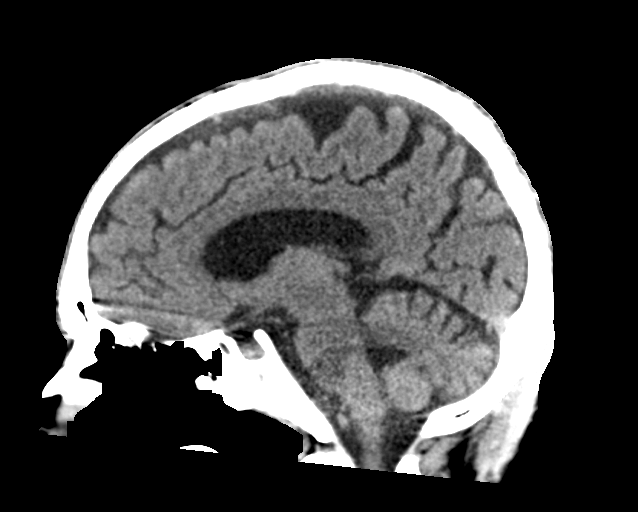
[im 43/64  brain]
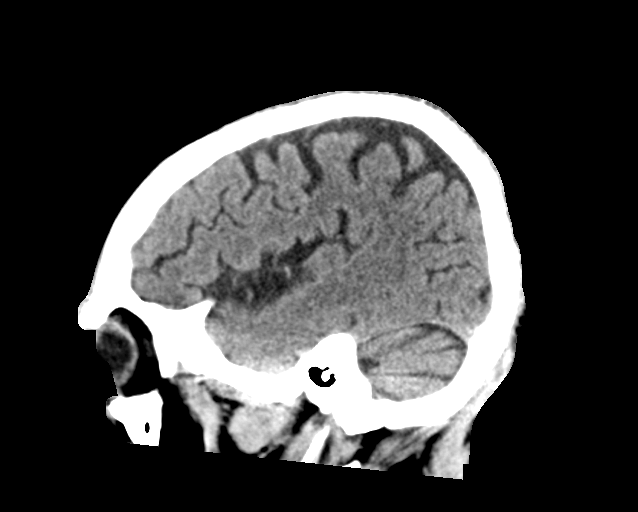

[Series 6: head without cor · coronal · non-contrast · 0.38mm/px · 3 of 77 slices shown]
[im 26/77  brain]
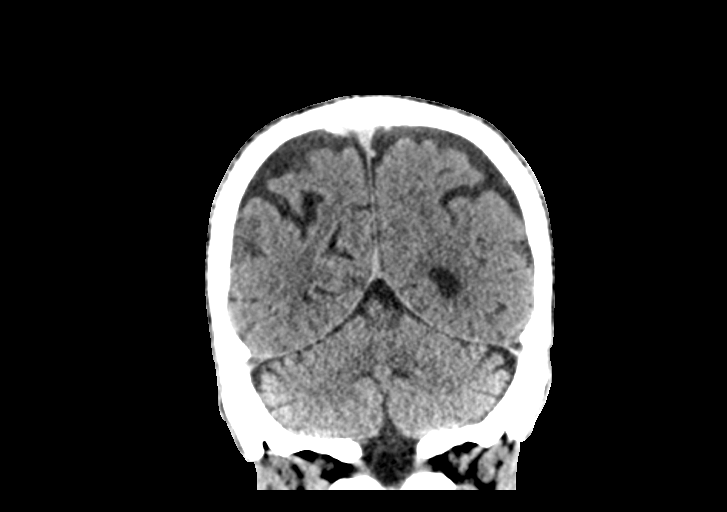
[im 34/77  brain]
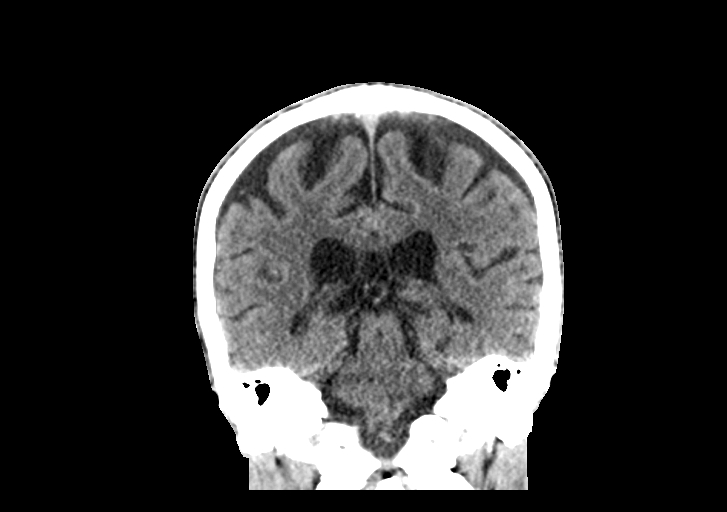
[im 43/77  brain]
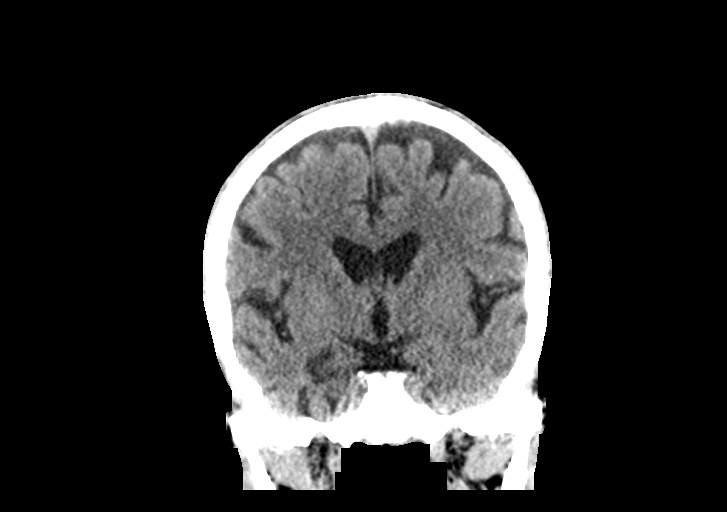

[15 of 47 positions shown; findings below may reference images not displayed]

FINDINGS: Brain: Mild atrophy is stable. No focal lesions are present. There
is no residual enhancement from earlier contrast or significant
focal edema to suggest metastatic disease to the brain. Ventricles
are stable and normal. No significant extra-axial fluid collection
is present.

Vascular: Stable atherosclerotic calcifications are evident in the
left cavernous internal carotid artery. There is no hyperdense
vessel.

Skull: The calvarium is intact. No focal lytic or blastic lesions
are present. No significant extracranial soft tissue injury is
present.

Sinuses/Orbits: The paranasal sinuses are clear. The mastoid air
cells are clear bilaterally. The external auditory canals are
opacified. The patient is status post bilateral lens replacements.
The globes and orbits are within normal limits.
IMPRESSION: 1. No acute intracranial abnormality or significant interval change.
2. Stable mild atrophy.
3. No evidence for metastatic disease to the brain.
4. Soft tissue in the external auditory canals bilaterally, likely
cerumen.

## 2018-04-25 IMAGING — DX DG CHEST 2V
2 series · 2 of 2 positions shown · non-contrast
Comparison: Chest CT September 07, 2016 and chest radiograph December 01, 2016

CLINICAL DATA: Cough and altered mental status. History of lung
carcinoma

EXAM:
CHEST  2 VIEW

[chest lat]
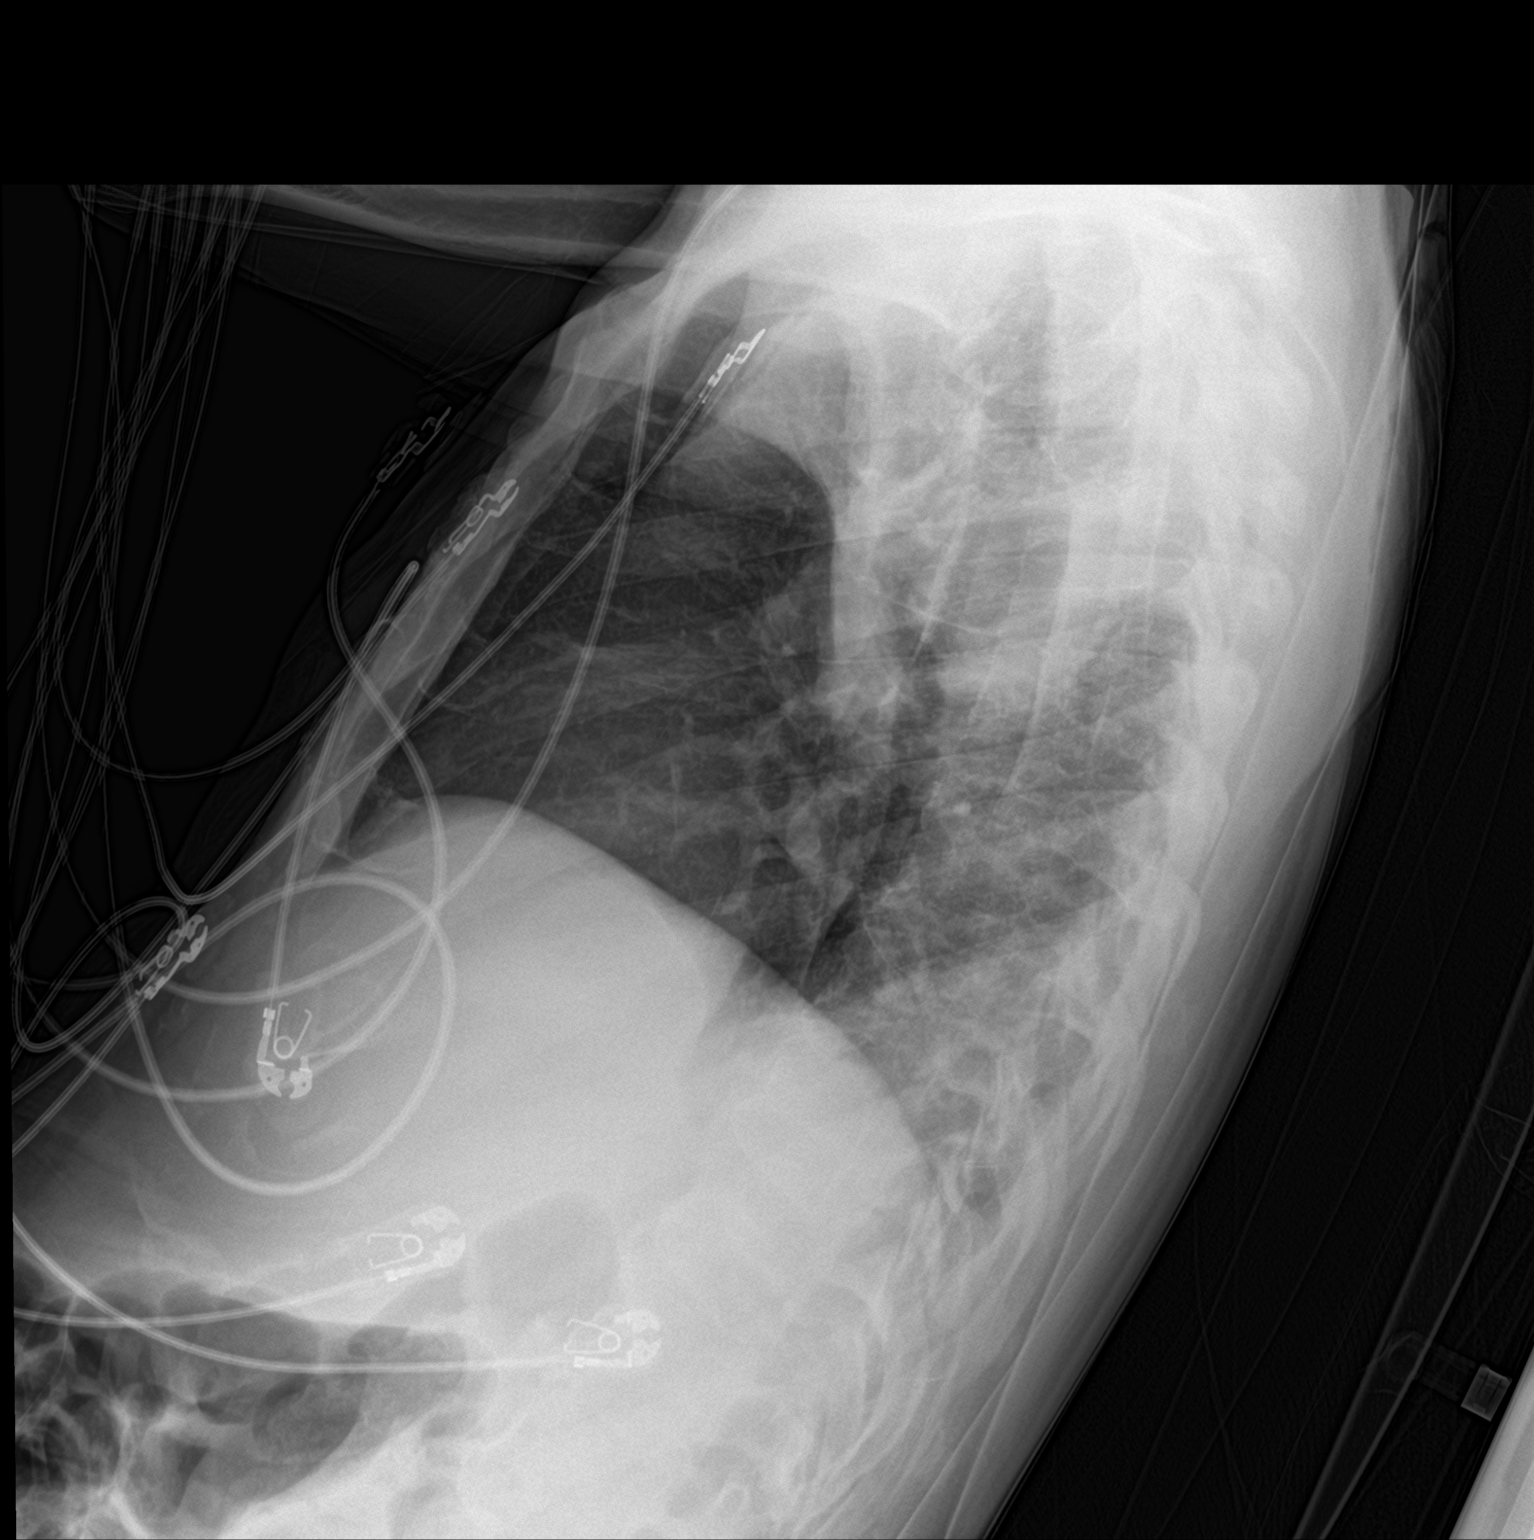

[chest ap]
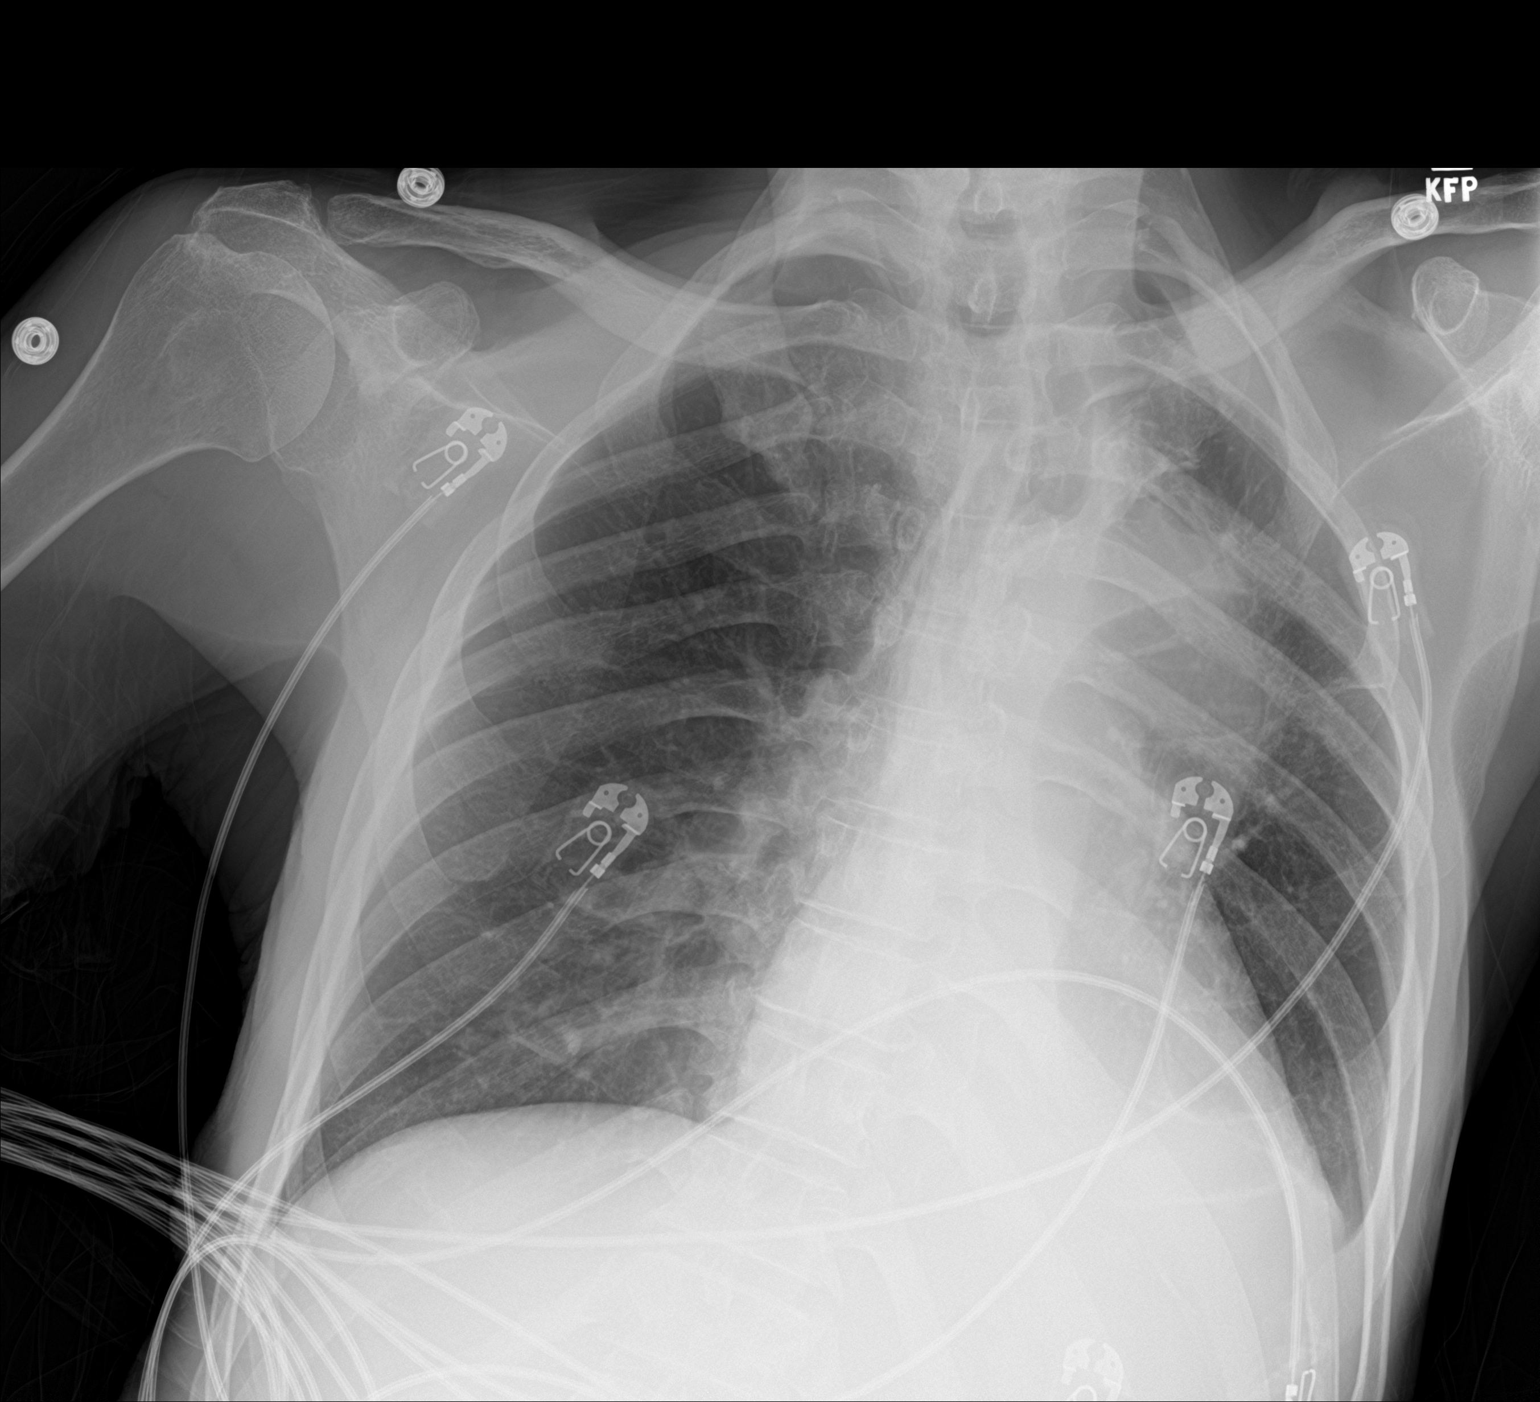

[2 of 2 positions shown; findings below may reference images not displayed]

FINDINGS: There is consolidation and volume loss in the left upper lobe with
findings concerning for suprahilar mass on the left, unchanged.
There is new opacity in the posterior left base, a likely focus of
pneumonia. Right lung is clear. Heart size is normal. No adenopathy
is seen on the right. Pulmonary vascularity appears normal on the
right and is distorted on the left. No bone lesions.
IMPRESSION: Mass with volume loss left upper lobe persists. Posterior left base
consolidation is present, felt to represent pneumonia. Right lung
clear. Stable cardiac silhouette.
